# Patient Record
Sex: Male | Born: 1935
Health system: Southern US, Community
[De-identification: ages and names within clinical notes are randomized; demographics above are authoritative.]

## PROBLEM LIST (undated history)

## (undated) DIAGNOSIS — I2510 Atherosclerotic heart disease of native coronary artery without angina pectoris: Secondary | ICD-10-CM

## (undated) DIAGNOSIS — H18519 Endothelial corneal dystrophy, unspecified eye: Secondary | ICD-10-CM

## (undated) DIAGNOSIS — C61 Malignant neoplasm of prostate: Secondary | ICD-10-CM

## (undated) DIAGNOSIS — H3530 Unspecified macular degeneration: Secondary | ICD-10-CM

## (undated) DIAGNOSIS — I10 Essential (primary) hypertension: Secondary | ICD-10-CM

## (undated) DIAGNOSIS — K635 Polyp of colon: Secondary | ICD-10-CM

## (undated) DIAGNOSIS — E785 Hyperlipidemia, unspecified: Secondary | ICD-10-CM

## (undated) DIAGNOSIS — H1851 Endothelial corneal dystrophy: Secondary | ICD-10-CM

## (undated) DIAGNOSIS — E119 Type 2 diabetes mellitus without complications: Secondary | ICD-10-CM

## (undated) HISTORY — DX: Polyp of colon: K63.5

## (undated) HISTORY — PX: OTHER SURGICAL HISTORY: SHX169

## (undated) HISTORY — DX: Atherosclerotic heart disease of native coronary artery without angina pectoris: I25.10

## (undated) HISTORY — DX: Essential (primary) hypertension: I10

## (undated) HISTORY — DX: Type 2 diabetes mellitus without complications: E11.9

## (undated) HISTORY — DX: Hyperlipidemia, unspecified: E78.5

---

## 1994-06-07 DIAGNOSIS — Z860100 Personal history of colon polyps, unspecified: Secondary | ICD-10-CM | POA: Insufficient documentation

## 1994-06-07 DIAGNOSIS — K635 Polyp of colon: Secondary | ICD-10-CM

## 1994-06-07 HISTORY — DX: Personal history of colonic polyps: Z86.010

## 1994-06-07 HISTORY — DX: Polyp of colon: K63.5

## 1994-06-07 HISTORY — DX: Personal history of colon polyps, unspecified: Z86.0100

## 1995-06-17 ENCOUNTER — Encounter: Payer: Self-pay | Admitting: Gastroenterology

## 1999-08-11 ENCOUNTER — Inpatient Hospital Stay (HOSPITAL_COMMUNITY): Admission: EM | Admit: 1999-08-11 | Discharge: 1999-08-14 | Payer: Self-pay | Admitting: Emergency Medicine

## 1999-08-11 ENCOUNTER — Encounter: Payer: Self-pay | Admitting: Internal Medicine

## 1999-08-12 ENCOUNTER — Encounter: Payer: Self-pay | Admitting: Internal Medicine

## 2000-06-07 DIAGNOSIS — E119 Type 2 diabetes mellitus without complications: Secondary | ICD-10-CM

## 2000-06-07 HISTORY — DX: Type 2 diabetes mellitus without complications: E11.9

## 2001-08-16 ENCOUNTER — Encounter: Payer: Self-pay | Admitting: Emergency Medicine

## 2001-08-16 ENCOUNTER — Emergency Department (HOSPITAL_COMMUNITY): Admission: EM | Admit: 2001-08-16 | Discharge: 2001-08-17 | Payer: Self-pay | Admitting: Emergency Medicine

## 2004-06-24 ENCOUNTER — Ambulatory Visit: Payer: Self-pay | Admitting: Internal Medicine

## 2004-12-22 ENCOUNTER — Ambulatory Visit: Payer: Self-pay | Admitting: Internal Medicine

## 2004-12-28 ENCOUNTER — Ambulatory Visit: Payer: Self-pay | Admitting: Gastroenterology

## 2004-12-29 ENCOUNTER — Ambulatory Visit: Payer: Self-pay | Admitting: Internal Medicine

## 2005-01-11 ENCOUNTER — Ambulatory Visit: Payer: Self-pay | Admitting: Gastroenterology

## 2005-03-24 ENCOUNTER — Ambulatory Visit: Payer: Self-pay | Admitting: Internal Medicine

## 2005-04-05 ENCOUNTER — Ambulatory Visit: Payer: Self-pay | Admitting: Internal Medicine

## 2005-07-26 ENCOUNTER — Ambulatory Visit: Payer: Self-pay | Admitting: Internal Medicine

## 2005-08-02 ENCOUNTER — Ambulatory Visit: Payer: Self-pay | Admitting: Internal Medicine

## 2005-10-25 ENCOUNTER — Ambulatory Visit: Payer: Self-pay | Admitting: Internal Medicine

## 2005-11-02 ENCOUNTER — Ambulatory Visit: Payer: Self-pay | Admitting: Internal Medicine

## 2006-02-21 ENCOUNTER — Ambulatory Visit: Payer: Self-pay | Admitting: Internal Medicine

## 2006-02-28 ENCOUNTER — Ambulatory Visit: Payer: Self-pay | Admitting: Internal Medicine

## 2006-06-27 ENCOUNTER — Ambulatory Visit: Payer: Self-pay | Admitting: Internal Medicine

## 2006-06-27 LAB — CONVERTED CEMR LAB
ALT: 21 units/L (ref 0–40)
AST: 20 units/L (ref 0–37)
Albumin: 3.7 g/dL (ref 3.5–5.2)
Alkaline Phosphatase: 88 units/L (ref 39–117)
Bilirubin, Direct: 0.2 mg/dL (ref 0.0–0.3)
Cholesterol: 112 mg/dL (ref 0–200)
Creatinine,U: 138.5 mg/dL
HDL: 35.1 mg/dL — ABNORMAL LOW (ref >39.0)
Hgb A1c MFr Bld: 7 % — ABNORMAL HIGH (ref 4.6–6.0)
LDL Cholesterol: 63 mg/dL (ref 0–99)
Microalb Creat Ratio: 7.9 mg/g (ref 0.0–30.0)
Microalb, Ur: 1.1 mg/dL (ref 0.0–1.9)
PSA: 3.02 ng/mL (ref 0.10–4.00)
Total Bilirubin: 0.7 mg/dL (ref 0.3–1.2)
Total CHOL/HDL Ratio: 3.2
Total Protein: 6.5 g/dL (ref 6.0–8.3)
Triglycerides: 69 mg/dL (ref 0–149)
VLDL: 14 mg/dL (ref 0–40)

## 2006-07-04 ENCOUNTER — Ambulatory Visit: Payer: Self-pay | Admitting: Internal Medicine

## 2006-07-04 ENCOUNTER — Encounter: Payer: Self-pay | Admitting: Internal Medicine

## 2006-07-04 DIAGNOSIS — I10 Essential (primary) hypertension: Secondary | ICD-10-CM | POA: Insufficient documentation

## 2006-07-04 DIAGNOSIS — E1122 Type 2 diabetes mellitus with diabetic chronic kidney disease: Secondary | ICD-10-CM | POA: Insufficient documentation

## 2006-07-04 DIAGNOSIS — E119 Type 2 diabetes mellitus without complications: Secondary | ICD-10-CM | POA: Insufficient documentation

## 2006-07-04 DIAGNOSIS — E785 Hyperlipidemia, unspecified: Secondary | ICD-10-CM

## 2006-07-04 DIAGNOSIS — E1136 Type 2 diabetes mellitus with diabetic cataract: Secondary | ICD-10-CM | POA: Insufficient documentation

## 2006-07-04 HISTORY — DX: Essential (primary) hypertension: I10

## 2006-07-04 HISTORY — DX: Type 2 diabetes mellitus without complications: E11.9

## 2006-10-24 ENCOUNTER — Ambulatory Visit: Payer: Self-pay | Admitting: Internal Medicine

## 2006-10-24 LAB — CONVERTED CEMR LAB
ALT: 23 units/L (ref 0–40)
AST: 20 units/L (ref 0–37)
Albumin: 3.8 g/dL (ref 3.5–5.2)
Alkaline Phosphatase: 80 units/L (ref 39–117)
BUN: 32 mg/dL — ABNORMAL HIGH (ref 6–23)
Basophils Absolute: 0 10*3/uL (ref 0.0–0.1)
Basophils Relative: 0.5 % (ref 0.0–1.0)
Bilirubin, Direct: 0.1 mg/dL (ref 0.0–0.3)
CO2: 33 meq/L — ABNORMAL HIGH (ref 19–32)
Calcium: 9.2 mg/dL (ref 8.4–10.5)
Chloride: 110 meq/L (ref 96–112)
Cholesterol: 134 mg/dL (ref 0–200)
Creatinine, Ser: 1.1 mg/dL (ref 0.4–1.5)
Eosinophils Absolute: 0.1 10*3/uL (ref 0.0–0.6)
Eosinophils Relative: 1.2 % (ref 0.0–5.0)
GFR calc Af Amer: 85 mL/min
GFR calc non Af Amer: 70 mL/min
Glucose, Bld: 123 mg/dL — ABNORMAL HIGH (ref 70–99)
HCT: 38.5 % — ABNORMAL LOW (ref 39.0–52.0)
HDL: 41.3 mg/dL (ref >39.0)
Hemoglobin: 13 g/dL (ref 13.0–17.0)
Hgb A1c MFr Bld: 7.3 % — ABNORMAL HIGH (ref 4.6–6.0)
LDL Cholesterol: 77 mg/dL (ref 0–99)
Lymphocytes Relative: 26.3 % (ref 12.0–46.0)
MCHC: 33.8 g/dL (ref 30.0–36.0)
MCV: 89.8 fL (ref 78.0–100.0)
Monocytes Absolute: 0.9 10*3/uL — ABNORMAL HIGH (ref 0.2–0.7)
Monocytes Relative: 9.4 % (ref 3.0–11.0)
Neutro Abs: 6.1 10*3/uL (ref 1.4–7.7)
Neutrophils Relative %: 62.6 % (ref 43.0–77.0)
Platelets: 254 10*3/uL (ref 150–400)
Potassium: 4.4 meq/L (ref 3.5–5.1)
RBC: 4.29 M/uL (ref 4.22–5.81)
RDW: 12.5 % (ref 11.5–14.6)
Sodium: 148 meq/L — ABNORMAL HIGH (ref 135–145)
Total Bilirubin: 0.5 mg/dL (ref 0.3–1.2)
Total CHOL/HDL Ratio: 3.2
Total Protein: 6.7 g/dL (ref 6.0–8.3)
Triglycerides: 79 mg/dL (ref 0–149)
VLDL: 16 mg/dL (ref 0–40)
WBC: 9.8 10*3/uL (ref 4.5–10.5)

## 2006-11-04 ENCOUNTER — Ambulatory Visit: Payer: Self-pay | Admitting: Internal Medicine

## 2007-02-27 ENCOUNTER — Ambulatory Visit: Payer: Self-pay | Admitting: Internal Medicine

## 2007-02-27 LAB — CONVERTED CEMR LAB
ALT: 25 units/L (ref 0–53)
AST: 24 units/L (ref 0–37)
Albumin: 3.8 g/dL (ref 3.5–5.2)
Alkaline Phosphatase: 84 units/L (ref 39–117)
BUN: 32 mg/dL — ABNORMAL HIGH (ref 6–23)
Bilirubin, Direct: 0.2 mg/dL (ref 0.0–0.3)
CO2: 30 meq/L (ref 19–32)
Calcium: 9.3 mg/dL (ref 8.4–10.5)
Chloride: 106 meq/L (ref 96–112)
Cholesterol: 118 mg/dL (ref 0–200)
Creatinine, Ser: 1.1 mg/dL (ref 0.4–1.5)
GFR calc Af Amer: 84 mL/min
GFR calc non Af Amer: 70 mL/min
Glucose, Bld: 115 mg/dL — ABNORMAL HIGH (ref 70–99)
HDL: 35.9 mg/dL — ABNORMAL LOW (ref >39.0)
Hgb A1c MFr Bld: 7.2 % — ABNORMAL HIGH (ref 4.6–6.0)
LDL Cholesterol: 68 mg/dL (ref 0–99)
Potassium: 5 meq/L (ref 3.5–5.1)
Sodium: 144 meq/L (ref 135–145)
Total Bilirubin: 0.8 mg/dL (ref 0.3–1.2)
Total CHOL/HDL Ratio: 3.3
Total Protein: 6.5 g/dL (ref 6.0–8.3)
Triglycerides: 70 mg/dL (ref 0–149)
VLDL: 14 mg/dL (ref 0–40)

## 2007-03-06 ENCOUNTER — Ambulatory Visit: Payer: Self-pay | Admitting: Internal Medicine

## 2007-03-06 LAB — CONVERTED CEMR LAB
Cholesterol, target level: 200 mg/dL
HDL goal, serum: 40 mg/dL
LDL Goal: 100 mg/dL

## 2007-06-23 ENCOUNTER — Ambulatory Visit: Payer: Self-pay | Admitting: Internal Medicine

## 2007-06-23 LAB — CONVERTED CEMR LAB
ALT: 22 units/L (ref 0–53)
AST: 23 units/L (ref 0–37)
Bilirubin, Direct: 0.1 mg/dL (ref 0.0–0.3)
CO2: 32 meq/L (ref 19–32)
Calcium: 9.9 mg/dL (ref 8.4–10.5)
Chloride: 106 meq/L (ref 96–112)
Creatinine,U: 147.4 mg/dL
Glucose, Bld: 111 mg/dL — ABNORMAL HIGH (ref 70–99)
HDL: 36.4 mg/dL — ABNORMAL LOW (ref >39.0)
Sodium: 145 meq/L (ref 135–145)
Total CHOL/HDL Ratio: 3.4
Total Protein: 6.9 g/dL (ref 6.0–8.3)

## 2007-06-30 ENCOUNTER — Ambulatory Visit: Payer: Self-pay | Admitting: Internal Medicine

## 2007-10-25 ENCOUNTER — Ambulatory Visit: Payer: Self-pay | Admitting: Internal Medicine

## 2007-10-25 LAB — CONVERTED CEMR LAB
Bilirubin, Direct: 0.1 mg/dL (ref 0.0–0.3)
Calcium: 9.3 mg/dL (ref 8.4–10.5)
GFR calc Af Amer: 84 mL/min
GFR calc non Af Amer: 70 mL/min
Glucose, Bld: 117 mg/dL — ABNORMAL HIGH (ref 70–99)
HDL: 33.6 mg/dL — ABNORMAL LOW (ref >39.0)
LDL Cholesterol: 73 mg/dL (ref 0–99)
PSA: 2.39 ng/mL (ref 0.10–4.00)
Potassium: 4.9 meq/L (ref 3.5–5.1)
Sodium: 144 meq/L (ref 135–145)
Total Bilirubin: 0.8 mg/dL (ref 0.3–1.2)
Total CHOL/HDL Ratio: 3.9
Total Protein: 6.9 g/dL (ref 6.0–8.3)
VLDL: 25 mg/dL (ref 0–40)

## 2007-11-01 ENCOUNTER — Ambulatory Visit: Payer: Self-pay | Admitting: Internal Medicine

## 2008-02-20 ENCOUNTER — Ambulatory Visit: Payer: Self-pay | Admitting: Internal Medicine

## 2008-02-20 LAB — CONVERTED CEMR LAB
AST: 26 units/L (ref 0–37)
Albumin: 3.8 g/dL (ref 3.5–5.2)
Alkaline Phosphatase: 87 units/L (ref 39–117)
BUN: 28 mg/dL — ABNORMAL HIGH (ref 6–23)
Cholesterol: 146 mg/dL (ref 0–200)
Creatinine, Ser: 1 mg/dL (ref 0.4–1.5)
GFR calc Af Amer: 94 mL/min
Glucose, Bld: 123 mg/dL — ABNORMAL HIGH (ref 70–99)
Potassium: 4.7 meq/L (ref 3.5–5.1)
Total Protein: 6.9 g/dL (ref 6.0–8.3)
Triglycerides: 80 mg/dL (ref 0–149)
VLDL: 16 mg/dL (ref 0–40)

## 2008-02-27 ENCOUNTER — Ambulatory Visit: Payer: Self-pay | Admitting: Internal Medicine

## 2008-03-13 ENCOUNTER — Encounter: Payer: Self-pay | Admitting: Internal Medicine

## 2008-06-07 HISTORY — PX: OTHER SURGICAL HISTORY: SHX169

## 2008-06-18 ENCOUNTER — Ambulatory Visit: Payer: Self-pay | Admitting: Internal Medicine

## 2008-06-18 LAB — CONVERTED CEMR LAB
ALT: 21 units/L (ref 0–53)
AST: 20 units/L (ref 0–37)
Bilirubin, Direct: 0.1 mg/dL (ref 0.0–0.3)
CO2: 30 meq/L (ref 19–32)
Chloride: 103 meq/L (ref 96–112)
Sodium: 142 meq/L (ref 135–145)
Total Bilirubin: 0.9 mg/dL (ref 0.3–1.2)
Total CHOL/HDL Ratio: 3.1
Triglycerides: 65 mg/dL (ref 0–149)

## 2008-06-25 ENCOUNTER — Ambulatory Visit: Payer: Self-pay | Admitting: Internal Medicine

## 2008-10-15 ENCOUNTER — Ambulatory Visit: Payer: Self-pay | Admitting: Internal Medicine

## 2008-10-15 LAB — CONVERTED CEMR LAB
AST: 25 units/L (ref 0–37)
Alkaline Phosphatase: 62 units/L (ref 39–117)
BUN: 32 mg/dL — ABNORMAL HIGH (ref 6–23)
Bilirubin, Direct: 0 mg/dL (ref 0.0–0.3)
CO2: 33 meq/L — ABNORMAL HIGH (ref 19–32)
Calcium: 9.4 mg/dL (ref 8.4–10.5)
Chloride: 103 meq/L (ref 96–112)
Creatinine, Ser: 1.1 mg/dL (ref 0.4–1.5)
Glucose, Bld: 97 mg/dL (ref 70–99)
LDL Cholesterol: 65 mg/dL (ref 0–99)
Total Bilirubin: 0.7 mg/dL (ref 0.3–1.2)
Total CHOL/HDL Ratio: 3
Triglycerides: 81 mg/dL (ref 0.0–149.0)

## 2008-10-22 ENCOUNTER — Ambulatory Visit: Payer: Self-pay | Admitting: Internal Medicine

## 2009-02-21 ENCOUNTER — Ambulatory Visit: Payer: Self-pay | Admitting: Internal Medicine

## 2009-02-21 LAB — CONVERTED CEMR LAB
Alkaline Phosphatase: 76 units/L (ref 39–117)
BUN: 26 mg/dL — ABNORMAL HIGH (ref 6–23)
Bilirubin, Direct: 0 mg/dL (ref 0.0–0.3)
CO2: 33 meq/L — ABNORMAL HIGH (ref 19–32)
Chloride: 108 meq/L (ref 96–112)
Cholesterol: 130 mg/dL (ref 0–200)
Creatinine, Ser: 1.1 mg/dL (ref 0.4–1.5)
Hgb A1c MFr Bld: 6.9 % — ABNORMAL HIGH (ref 4.6–6.5)
LDL Cholesterol: 74 mg/dL (ref 0–99)
Potassium: 4.5 meq/L (ref 3.5–5.1)
Total CHOL/HDL Ratio: 3
Total Protein: 7.1 g/dL (ref 6.0–8.3)
VLDL: 10.4 mg/dL (ref 0.0–40.0)

## 2009-02-28 ENCOUNTER — Ambulatory Visit: Payer: Self-pay | Admitting: Internal Medicine

## 2009-03-06 ENCOUNTER — Encounter: Payer: Self-pay | Admitting: Internal Medicine

## 2009-03-19 ENCOUNTER — Encounter: Payer: Self-pay | Admitting: Internal Medicine

## 2009-03-24 ENCOUNTER — Encounter: Payer: Self-pay | Admitting: Internal Medicine

## 2009-06-18 ENCOUNTER — Ambulatory Visit: Payer: Self-pay | Admitting: Internal Medicine

## 2009-06-18 LAB — CONVERTED CEMR LAB
ALT: 21 units/L (ref 0–53)
Chloride: 107 meq/L (ref 96–112)
Cholesterol: 157 mg/dL (ref 0–200)
GFR calc non Af Amer: 69.29 mL/min (ref >60)
HDL: 47.7 mg/dL (ref >39.00)
Hgb A1c MFr Bld: 7.2 % — ABNORMAL HIGH (ref 4.6–6.5)
LDL Cholesterol: 93 mg/dL (ref 0–99)
Potassium: 4.4 meq/L (ref 3.5–5.1)
Sodium: 143 meq/L (ref 135–145)
Total Bilirubin: 0.7 mg/dL (ref 0.3–1.2)
Total Protein: 6.6 g/dL (ref 6.0–8.3)
VLDL: 16.6 mg/dL (ref 0.0–40.0)

## 2009-07-07 ENCOUNTER — Ambulatory Visit: Payer: Self-pay | Admitting: Internal Medicine

## 2009-10-20 ENCOUNTER — Ambulatory Visit: Payer: Self-pay | Admitting: Internal Medicine

## 2009-10-20 LAB — CONVERTED CEMR LAB
Albumin: 4 g/dL (ref 3.5–5.2)
BUN: 40 mg/dL — ABNORMAL HIGH (ref 6–23)
Basophils Relative: 0.8 % (ref 0.0–3.0)
Bilirubin Urine: NEGATIVE
Bilirubin, Direct: 0.1 mg/dL (ref 0.0–0.3)
Blood in Urine, dipstick: NEGATIVE
CO2: 31 meq/L (ref 19–32)
Chloride: 104 meq/L (ref 96–112)
Cholesterol: 134 mg/dL (ref 0–200)
Creatinine, Ser: 1.3 mg/dL (ref 0.4–1.5)
Eosinophils Absolute: 0.6 10*3/uL (ref 0.0–0.7)
Glucose, Bld: 118 mg/dL — ABNORMAL HIGH (ref 70–99)
Glucose, Urine, Semiquant: NEGATIVE
Hgb A1c MFr Bld: 7.1 % — ABNORMAL HIGH (ref 4.6–6.5)
Ketones, urine, test strip: NEGATIVE
MCHC: 34.7 g/dL (ref 30.0–36.0)
MCV: 90 fL (ref 78.0–100.0)
Microalb Creat Ratio: 1.4 mg/g (ref 0.0–30.0)
Monocytes Absolute: 1 10*3/uL (ref 0.1–1.0)
Neutrophils Relative %: 55.9 % (ref 43.0–77.0)
PSA: 3.53 ng/mL (ref 0.10–4.00)
RBC: 3.74 M/uL — ABNORMAL LOW (ref 4.22–5.81)
RDW: 13.6 % (ref 11.5–14.6)
Specific Gravity, Urine: 1.025
Total CHOL/HDL Ratio: 3
Total Protein: 6.8 g/dL (ref 6.0–8.3)
Triglycerides: 103 mg/dL (ref 0.0–149.0)
pH: 5

## 2009-10-27 ENCOUNTER — Ambulatory Visit: Payer: Self-pay | Admitting: Internal Medicine

## 2009-10-27 DIAGNOSIS — D649 Anemia, unspecified: Secondary | ICD-10-CM

## 2009-10-27 HISTORY — DX: Anemia, unspecified: D64.9

## 2009-10-27 LAB — CONVERTED CEMR LAB
IgA: 197 mg/dL (ref 68–378)
IgG (Immunoglobin G), Serum: 1060 mg/dL (ref 694–1618)

## 2009-10-29 LAB — CONVERTED CEMR LAB
Basophils Relative: 0.7 % (ref 0.0–3.0)
Eosinophils Relative: 6.1 % — ABNORMAL HIGH (ref 0.0–5.0)
Lymphocytes Relative: 25.5 % (ref 12.0–46.0)
Monocytes Relative: 9.8 % (ref 3.0–12.0)
Neutrophils Relative %: 57.9 % (ref 43.0–77.0)
RBC: 3.59 M/uL — ABNORMAL LOW (ref 4.22–5.81)
Saturation Ratios: 21.3 % (ref 20.0–50.0)
Transferrin: 218.3 mg/dL (ref 212.0–360.0)
WBC: 11.1 10*3/uL — ABNORMAL HIGH (ref 4.5–10.5)

## 2010-02-18 ENCOUNTER — Ambulatory Visit: Payer: Self-pay | Admitting: Internal Medicine

## 2010-02-18 LAB — CONVERTED CEMR LAB
AST: 21 units/L (ref 0–37)
Albumin: 3.9 g/dL (ref 3.5–5.2)
Alkaline Phosphatase: 66 units/L (ref 39–117)
CO2: 28 meq/L (ref 19–32)
Calcium: 9.6 mg/dL (ref 8.4–10.5)
Cholesterol: 125 mg/dL (ref 0–200)
Creatinine, Ser: 1.2 mg/dL (ref 0.4–1.5)
Total CHOL/HDL Ratio: 3
Total Protein: 6.5 g/dL (ref 6.0–8.3)
Triglycerides: 108 mg/dL (ref 0.0–149.0)

## 2010-03-04 ENCOUNTER — Ambulatory Visit: Payer: Self-pay | Admitting: Internal Medicine

## 2010-03-09 LAB — CONVERTED CEMR LAB
Basophils Absolute: 0.1 10*3/uL (ref 0.0–0.1)
Eosinophils Absolute: 0.2 10*3/uL (ref 0.0–0.7)
Eosinophils Relative: 2.1 % (ref 0.0–5.0)
Folate: >20 ng/mL
MCV: 91.1 fL (ref 78.0–100.0)
Monocytes Absolute: 1.1 10*3/uL — ABNORMAL HIGH (ref 0.1–1.0)
Neutrophils Relative %: 59.5 % (ref 43.0–77.0)
Platelets: 224 10*3/uL (ref 150.0–400.0)
RDW: 12.9 % (ref 11.5–14.6)
Saturation Ratios: 19.5 % — ABNORMAL LOW (ref 20.0–50.0)
Vitamin B-12: 390 pg/mL (ref 211–911)
WBC: 10.6 10*3/uL — ABNORMAL HIGH (ref 4.5–10.5)

## 2010-03-10 ENCOUNTER — Encounter: Payer: Self-pay | Admitting: Gastroenterology

## 2010-03-10 ENCOUNTER — Ambulatory Visit: Payer: Self-pay | Admitting: Internal Medicine

## 2010-03-17 ENCOUNTER — Ambulatory Visit: Payer: Self-pay | Admitting: Internal Medicine

## 2010-03-17 LAB — CONVERTED CEMR LAB: OCCULT 3: NEGATIVE

## 2010-04-06 ENCOUNTER — Encounter: Payer: Self-pay | Admitting: Internal Medicine

## 2010-04-20 DIAGNOSIS — Z86010 Personal history of colonic polyps: Secondary | ICD-10-CM | POA: Insufficient documentation

## 2010-04-20 DIAGNOSIS — K649 Unspecified hemorrhoids: Secondary | ICD-10-CM | POA: Insufficient documentation

## 2010-04-21 ENCOUNTER — Encounter (INDEPENDENT_AMBULATORY_CARE_PROVIDER_SITE_OTHER): Payer: Self-pay | Admitting: *Deleted

## 2010-04-21 ENCOUNTER — Ambulatory Visit: Payer: Self-pay | Admitting: Gastroenterology

## 2010-05-25 ENCOUNTER — Ambulatory Visit: Payer: Self-pay | Admitting: Gastroenterology

## 2010-05-25 DIAGNOSIS — K2990 Gastroduodenitis, unspecified, without bleeding: Secondary | ICD-10-CM

## 2010-05-25 DIAGNOSIS — K2970 Gastritis, unspecified, without bleeding: Secondary | ICD-10-CM

## 2010-05-25 HISTORY — DX: Gastritis, unspecified, without bleeding: K29.70

## 2010-05-26 LAB — HM COLONOSCOPY: HM Colonoscopy: NORMAL

## 2010-05-27 ENCOUNTER — Encounter: Payer: Self-pay | Admitting: Gastroenterology

## 2010-06-16 ENCOUNTER — Ambulatory Visit
Admission: RE | Admit: 2010-06-16 | Discharge: 2010-06-16 | Payer: Self-pay | Source: Home / Self Care | Attending: Gastroenterology | Admitting: Gastroenterology

## 2010-06-16 DIAGNOSIS — R1013 Epigastric pain: Secondary | ICD-10-CM | POA: Insufficient documentation

## 2010-06-16 DIAGNOSIS — R110 Nausea: Secondary | ICD-10-CM | POA: Insufficient documentation

## 2010-06-19 ENCOUNTER — Ambulatory Visit (HOSPITAL_COMMUNITY)
Admission: RE | Admit: 2010-06-19 | Discharge: 2010-06-19 | Payer: Self-pay | Source: Home / Self Care | Attending: Gastroenterology | Admitting: Gastroenterology

## 2010-06-30 ENCOUNTER — Encounter: Payer: Self-pay | Admitting: Internal Medicine

## 2010-06-30 ENCOUNTER — Ambulatory Visit
Admission: RE | Admit: 2010-06-30 | Discharge: 2010-06-30 | Payer: Self-pay | Source: Home / Self Care | Attending: Internal Medicine | Admitting: Internal Medicine

## 2010-06-30 ENCOUNTER — Other Ambulatory Visit: Payer: Self-pay | Admitting: Internal Medicine

## 2010-06-30 DIAGNOSIS — D649 Anemia, unspecified: Secondary | ICD-10-CM

## 2010-06-30 DIAGNOSIS — E119 Type 2 diabetes mellitus without complications: Secondary | ICD-10-CM

## 2010-06-30 DIAGNOSIS — K219 Gastro-esophageal reflux disease without esophagitis: Secondary | ICD-10-CM

## 2010-06-30 DIAGNOSIS — E785 Hyperlipidemia, unspecified: Secondary | ICD-10-CM

## 2010-06-30 DIAGNOSIS — I10 Essential (primary) hypertension: Secondary | ICD-10-CM

## 2010-06-30 LAB — CBC WITH DIFFERENTIAL/PLATELET
Basophils Relative: 0.6 % (ref 0.0–3.0)
Eosinophils Absolute: 0.2 10*3/uL (ref 0.0–0.7)
HCT: 32.7 % — ABNORMAL LOW (ref 39.0–52.0)
Hemoglobin: 11.2 g/dL — ABNORMAL LOW (ref 13.0–17.0)
Lymphs Abs: 2.4 10*3/uL (ref 0.7–4.0)
MCHC: 34.3 g/dL (ref 30.0–36.0)
MCV: 90.6 fl (ref 78.0–100.0)
Monocytes Absolute: 0.7 10*3/uL (ref 0.1–1.0)
Neutro Abs: 5.4 10*3/uL (ref 1.4–7.7)
RBC: 3.61 Mil/uL — ABNORMAL LOW (ref 4.22–5.81)

## 2010-06-30 LAB — LIPID PANEL
Cholesterol: 111 mg/dL (ref 0–200)
LDL Cholesterol: 60 mg/dL (ref 0–99)

## 2010-06-30 LAB — BASIC METABOLIC PANEL
BUN: 28 mg/dL — ABNORMAL HIGH (ref 6–23)
Calcium: 9.4 mg/dL (ref 8.4–10.5)
Chloride: 105 mEq/L (ref 96–112)
Creatinine, Ser: 1.2 mg/dL (ref 0.4–1.5)

## 2010-06-30 LAB — HEPATIC FUNCTION PANEL
ALT: 16 U/L (ref 0–53)
AST: 19 U/L (ref 0–37)
Albumin: 3.6 g/dL (ref 3.5–5.2)
Total Bilirubin: 0.6 mg/dL (ref 0.3–1.2)
Total Protein: 6.1 g/dL (ref 6.0–8.3)

## 2010-06-30 LAB — HEMOGLOBIN A1C: Hgb A1c MFr Bld: 7.2 % — ABNORMAL HIGH (ref 4.6–6.5)

## 2010-06-30 NOTE — Assessment & Plan Note (Signed)
Well controlled. Continue current medications. Check Bmet med today.

## 2010-06-30 NOTE — Assessment & Plan Note (Signed)
Check laboratory work today. We'll continue simvastatin.

## 2010-06-30 NOTE — Assessment & Plan Note (Signed)
I suspect well controlled. Check laboratory work today. Reviewed recent laboratory work. Reviewed medications. Continue same medications.

## 2010-06-30 NOTE — Assessment & Plan Note (Signed)
Check labs today.

## 2010-06-30 NOTE — Assessment & Plan Note (Signed)
Patient has followup with Dr. Jarold Motto. With this evaluation. Repeat ultrasound results with the patient.

## 2010-06-30 NOTE — Progress Notes (Signed)
Subjective:     Patient ID: [name] is a 77 y.o. male.  HPI Dm--home cbgs-none--no polyuria or polydipsia htn--no home BPs Lipids---tolerating meds GERD---followed by dr patterson--pt asks about [place] results The following portions of the patient's history were reviewed and updated as appropriate: allergies, current medications, past family history, past medical history, past social history, past surgical history and problem list.  Review of Systems  Constitutional: Negative for diaphoresis, activity change and appetite change.  HENT: Negative for congestion, rhinorrhea and neck pain.   Eyes: Negative for photophobia and discharge.  Respiratory: Negative for choking.   Cardiovascular: Negative for chest pain.  Genitourinary: Negative for dysuria and flank pain.  Musculoskeletal: Negative for back pain and arthralgias.  Neurological: Negative for dizziness.  Hematological: Negative for adenopathy.  Psychiatric/Behavioral: Negative for confusion and agitation.       Objective:   Physical Exam  Constitutional: He appears well-developed and well-nourished.  HENT:  Head: Normocephalic and atraumatic.  Right Ear: External ear normal.  Left Ear: External ear normal.  Nose: Nose normal.  Eyes: Right eye exhibits no discharge. No scleral icterus.  Neck: Neck supple. No thyromegaly present.  Cardiovascular: Normal rate, regular rhythm and normal heart sounds.   No murmur heard. Pulmonary/Chest: Effort normal and breath sounds normal. No respiratory distress.  Abdominal: Soft. Bowel sounds are normal. He exhibits no distension. There is no tenderness.  Musculoskeletal: He exhibits no edema.  Lymphadenopathy:    He has no cervical adenopathy.  Neurological: He is alert. No cranial nerve deficit.  Skin: Skin is warm and dry.  Psychiatric: He has a normal mood and affect.       Assessment:

## 2010-06-30 NOTE — Progress Notes (Deleted)
Subjective:     Patient ID: [name] is a 77 y.o. male.  HPI  {Common ambulatory SmartLinks:19316}  Review of Systems  Constitutional: Negative for diaphoresis, activity change and appetite change.  HENT: Negative for congestion, rhinorrhea and neck pain.   Eyes: Negative for photophobia and discharge.  Respiratory: Negative for choking.   Cardiovascular: Negative for chest pain.  Genitourinary: Negative for dysuria and flank pain.  Musculoskeletal: Negative for back pain and arthralgias.  Neurological: Negative for dizziness.  Hematological: Negative for adenopathy.  Psychiatric/Behavioral: Negative for confusion and agitation.       Objective:   Physical Exam     Assessment:     ***    Plan:     ***

## 2010-07-08 ENCOUNTER — Other Ambulatory Visit (INDEPENDENT_AMBULATORY_CARE_PROVIDER_SITE_OTHER): Payer: Medicare PPO | Admitting: Internal Medicine

## 2010-07-08 DIAGNOSIS — R710 Precipitous drop in hematocrit: Secondary | ICD-10-CM

## 2010-07-09 NOTE — Assessment & Plan Note (Signed)
Summary: 4 MONTH ROV/NJR   Vital Signs:  Patient profile:   77 year old male Weight:      126 pounds Temp:     98.7 degrees F oral Pulse rate:   68 / minute Pulse rhythm:   regular BP sitting:   130 / 58  (left arm) Cuff size:   regular  Vitals Entered By: Alfred Levins, CMA (June 30, 2010 8:08 AM) CC: f/u   CC:  f/u.  Current Medications (verified): 1)  Losartan Potassium-Hctz 100-25 Mg Tabs (Losartan Potassium-Hctz) .... Take 1 Tablet By Mouth Once A Day 2)  Felodipine 5 Mg Tb24 (Felodipine) .... One By Mouth Daily 3)  Metformin Hcl 1000 Mg Tabs (Metformin Hcl) .... Take 1 Tablet By Mouth Two Times A Day 4)  Simvastatin 40 Mg Tabs (Simvastatin) .... One By Mouth Daily 5)  Ecotrin 325 Mg Tbec (Aspirin) .[name].. 1 By Mouth Once Daily 6)  Fish Oil 1200 Mg Caps (Omega-3 Fatty Acids) .... Two Times A Day 7)  Centrum Silver  Tabs (Multiple Vitamins-Minerals) .... Once Daily 8)  Icaps  Caps (Multiple Vitamins-Minerals) .... Once Daily 9)  Optive 0.5-0.9 % Soln (Carboxymethylcellul-Glycerin) .... One Drop Both Eyes Once Daily 10)  Align  Caps (Probiotic Product) .... Take One By Mouth Once Daily 11)  Carafate 1 Gm/79ml Susp (Sucralfate) .... Take 2 Teaspoons After Meals and At Bedtime  Allergies (verified): No Known Drug Allergies   Complete Medication List: 1)  Losartan Potassium-hctz 100-25 Mg Tabs (Losartan potassium-hctz) .... Take 1 tablet by mouth once a day 2)  Felodipine 5 Mg Tb24 (Felodipine) .... One by mouth daily 3)  Metformin Hcl 1000 Mg Tabs (Metformin hcl) .... Take 1 tablet by mouth two times a day 4)  Simvastatin 40 Mg Tabs (Simvastatin) .... One by mouth daily 5)  Ecotrin 325 Mg Tbec (Aspirin) .[name].. 1 by mouth once daily 6)  Fish Oil 1200 Mg Caps (Omega-3 fatty acids) .... Two times a day 7)  Centrum Silver Tabs (Multiple vitamins-minerals) .... Once daily 8)  Icaps Caps (Multiple vitamins-minerals) .... Once daily 9)  Optive 0.5-0.9 % Soln  (Carboxymethylcellul-glycerin) .... One drop both eyes once daily 10)  Align Caps (Probiotic product) .... Take one by mouth once daily 11)  Carafate 1 Gm/63ml Susp (Sucralfate) .... Take 2 teaspoons after meals and at bedtime  Other Orders: TLB-CBC Platelet - w/Differential (85025-CBCD) Venipuncture (21308) TLB-BMP (Basic Metabolic Panel-BMET) (80048-METABOL) TLB-Lipid Panel (80061-LIPID) TLB-Hepatic/Liver Function Pnl (80076-HEPATIC) TLB-A1C / Hgb A1C (Glycohemoglobin) (83036-A1C)  Patient Instructions: 1)  Please schedule a follow-up appointment in 4 months. 2)  labs one week prior to visit 3)  lipids---272.4 4)  lfts-995.2 5)  bmet-995.2 6)  A1C-250.02 7)      Progress Notes    Judie Petit, MD 06/30/2010 8:39 AM Signed   Subjective:  Patient ID: [name] is a 77 y.o. male.  HPI  Dm--home cbgs-none--no polyuria or polydipsia  htn--no home BPs  Lipids---tolerating meds  GERD---followed by dr patterson--pt asks about [place] results  The following portions of the patient's history were reviewed and updated as appropriate: allergies, current medications, past family history, past medical history, past social history, past surgical history and problem list.  Review of Systems Constitutional: Negative for diaphoresis, activity change and appetite change. HENT: Negative for congestion, rhinorrhea and neck pain. Eyes: Negative for photophobia and discharge. Respiratory: Negative for choking. Cardiovascular: Negative for chest pain. Genitourinary: Negative for dysuria and flank pain. Musculoskeletal: Negative for back  pain and arthralgias. Neurological: Negative for dizziness. Hematological: Negative for adenopathy. Psychiatric/Behavioral: Negative for confusion and agitation.    Objective:  Physical Exam Constitutional: He appears well-developed and well-nourished. HENT: Head: Normocephalic and atraumatic. Right Ear: External ear normal. Left Ear: External ear  normal. Nose: Nose normal. Eyes: Right eye exhibits no discharge. No scleral icterus. Neck: Neck supple. No thyromegaly present. Cardiovascular: Normal rate, regular rhythm and normal heart sounds. No murmur heard.  Pulmonary/Chest: Effort normal and breath sounds normal. No respiratory distress. Abdominal: Soft. Bowel sounds are normal. He exhibits no distension. There is no tenderness. Musculoskeletal: He exhibits no edema. Lymphadenopathy: He has no cervical adenopathy. Neurological: He is alert. No cranial nerve deficit. Skin: Skin is warm and dry. Psychiatric: He has a normal mood and affect.    Assessment:         GERD - Judie Petit, MD 06/30/2010 8:37 AM Signed  Patient has followup with Dr. Jarold Motto. With this evaluation. Repeat ultrasound results with the patient.HYPERTENSION - Judie Petit, MD 06/30/2010 8:37 AM Signed  Well controlled. Continue current medications. Check Bmet med today.ANEMIA - Judie Petit, MD 06/30/2010 8:36 AM Signed  Check labs today.HYPERLIPIDEMIA - Judie Petit, MD 06/30/2010 8:36 AM Signed  Check laboratory work today. We'll continue simvastatin.DIABETES MELLITUS, TYPE II - Judie Petit, MD 06/30/2010 8:35 AM Signed  I suspect well controlled. Check laboratory work today. Reviewed recent laboratory work. Reviewed medications. Continue same medications.      Orders Added: 1)  Est. Patient Level IV [16109] 2)  TLB-CBC Platelet - w/Differential [85025-CBCD] 3)  Venipuncture [36415] 4)  TLB-BMP (Basic Metabolic Panel-BMET) [80048-METABOL] 5)  TLB-Lipid Panel [80061-LIPID] 6)  TLB-Hepatic/Liver Function Pnl [80076-HEPATIC] 7)  TLB-A1C / Hgb A1C (Glycohemoglobin) [83036-A1C]  Appended Document: Orders Update     Clinical Lists Changes  Orders: Added new Service order of Specimen Handling (60454) - Signed

## 2010-07-09 NOTE — Letter (Signed)
Summary: New Patient letter  [facility]  [address] [place], [place] [address]   Phone: [phone]  Fax: [phone]       03/10/2010 MRN: [id number]  [name] [address] [place], [place]  [address]  Dear [name],  Welcome to the Gastroenterology Division at [facility].    You are scheduled to see Dr.  Jarold Motto on 04-21-10 at 9am on the 3rd floor at [facility], [address]  We ask that you try to arrive at our office 15 minutes prior to your appointment time to allow for check-in.  We would like you to complete the enclosed self-administered evaluation form prior to your visit and bring it with you on the day of your appointment.  We will review it with you.  Also, please bring a complete list of all your medications or, if you prefer, bring the medication bottles and we will list them.  Please bring your insurance card so that we may make a copy of it.  If your insurance requires a referral to see a specialist, please bring your referral form from your primary care physician.  Co-payments are due at the time of your visit and may be paid by cash, check or credit card.     Your office visit will consist of a consult with your physician (includes a physical exam), any laboratory testing he/she may order, scheduling of any necessary diagnostic testing (e.g. x-ray, ultrasound, CT-scan), and scheduling of a procedure (e.g. Endoscopy, Colonoscopy) if required.  Please allow enough time on your schedule to allow for any/all of these possibilities.    If you cannot keep your appointment, please call [phone] to cancel or reschedule prior to your appointment date.  This allows [place] the opportunity to schedule an appointment for another patient in need of care.  If you do not cancel or reschedule by 5 p.m. the business day prior to your appointment date, you will be charged a $50.00 late cancellation/no-show fee.    Thank you for choosing [facility] for your medical needs.  We appreciate the opportunity to care for you.  Please visit [place] at our website  to learn more about our practice.                     Sincerely,                                                             The Gastroenterology Division

## 2010-07-09 NOTE — Assessment & Plan Note (Signed)
Summary: 4 MONTH ROV/NJR----PT RSC (BMP) // RS   Vital Signs:  Patient profile:   77 year old male Height:      64 inches (162.56 cm) Weight:      134.50 pounds (61.14 kg) BMI:     23.17 Temp:     98.4 degrees F (36.89 degrees C) oral BP sitting:   130 / 60  (left arm) Cuff size:   regular  Vitals Entered By: Lucious Groves CMA (March 04, 2010 9:30 AM) CC: 4 MO RTN OV./KB Is Patient Diabetic? No Pain Assessment Patient in pain? no      Comments Patient notes that he has noticed some fatigue, but is also aware his bloodwork (Hg, etc) may have been low./kb   CC:  4 MO RTN OV./KB.  History of Present Illness:  Follow-Up Visit      This is a 77 year old man who presents for Follow-up visit.  The patient denies chest pain and palpitations.  Since the last visit the patient notes no new problems or concerns.  The patient reports taking meds as prescribed.  When questioned about possible medication side effects, the patient notes none.    All other systems reviewed and were negative except some muscle fatigue when he climbs stairs  Current Problems (verified): 1)  Anemia  (ICD-285.9) 2)  Hypertension  (ICD-401.9) 3)  Hyperlipidemia  (ICD-272.4) 4)  Diabetes Mellitus, Type II  (ICD-250.00)  Current Medications (verified): 1)  Losartan Potassium-Hctz 100-25 Mg Tabs (Losartan Potassium-Hctz) .... Take 1 Tablet By Mouth Once A Day 2)  Felodipine 5 Mg Tb24 (Felodipine) .... One By Mouth Daily 3)  Metformin Hcl 1000 Mg Tabs (Metformin Hcl) .... Take 1 Tablet By Mouth Two Times A Day 4)  Simvastatin 40 Mg Tabs (Simvastatin) .... One By Mouth Daily 5)  Bayer Aspirin 325 Mg  Tabs (Aspirin) .... One By Mouth Daily 6)  Fish Oil 1200 Mg Caps (Omega-3 Fatty Acids) .... Two Times A Day 7)  Centrum Silver  Tabs (Multiple Vitamins-Minerals) .... Once Daily 8)  Icaps  Caps (Multiple Vitamins-Minerals) .... Once Daily 9)  Optive 0.5-0.9 % Soln (Carboxymethylcellul-Glycerin) .... One Drop Both  Eyes Once Daily  Allergies (verified): No Known Drug Allergies  Past History:  Past Medical History: Last updated: 07/04/2006 Diabetes mellitus, type II Hyperlipidemia Hypertension  Past Surgical History: Last updated: 03/06/2007 cataracts  Family History: Last updated: 07/04/2006 Family History of CAD Male 1st degree relative <60 Family History of CAD Male 1st degree relative <50 Family History of Stroke F 1st degree relative <60  Social History: Last updated: 07/04/2006 Occupation: Married Former Smoker Alcohol use-no  Risk Factors: Exercise: yes (07/04/2006)  Risk Factors: Smoking Status: quit (10/27/2009)  Physical Exam  General:  alert and well-developed.   Head:  normocephalic and atraumatic.   Eyes:  pupils equal and pupils round.   Ears:  R ear normal and L ear normal.   Neck:  No deformities, masses, or tenderness noted. Chest Wall:  No deformities, masses, tenderness or gynecomastia noted. Lungs:  normal respiratory effort and no intercostal retractions.   Heart:  normal rate and regular rhythm.   Abdomen:  soft and non-tender.   Skin:  turgor normal and color normal.   Psych:  normally interactive and good eye contact.     Impression & Recommendations:  Problem # 1:  ANEMIA (ICD-285.9) needs f/u has had GI eval Hgb: 11.3 (10/27/2009)   Hct: 32.7 (10/27/2009)   Platelets: 219.0 (10/27/2009) RBC: 3.59 (  10/27/2009)   RDW: 13.2 (10/27/2009)   WBC: 11.1 (10/27/2009) MCV: 91.2 (10/27/2009)   MCHC: 34.6 (10/27/2009) Ferritin: 33.2 (10/27/2009) Iron: 65 (10/27/2009)   % Sat: 21.3 (10/27/2009) B12: 419 (10/27/2009)   Folate: >20.0 ng/mL (10/27/2009)   TSH: 2.47 (10/20/2009)  Orders: Venipuncture (16109) TLB-CBC Platelet - w/Differential (85025-CBCD) TLB-B12 + Folate Pnl (60454_09811-B14/NWG) TLB-IBC Pnl (Iron/FE;Transferrin) (83550-IBC) TLB-Ferritin (82728-FER)  Problem # 2:  HYPERTENSION (ICD-401.9) controlled continue current medications    His updated medication list for this problem includes:    Losartan Potassium-hctz 100-25 Mg Tabs (Losartan potassium-hctz) .[name]... Take 1 tablet by mouth once a day    Felodipine 5 Mg Tb24 (Felodipine) ..... One by mouth daily  BP today: 130/60 Prior BP: 112/58 (10/27/2009)  Prior 10 Yr Risk Heart Disease: 18 % (03/06/2007)  Labs Reviewed: K+: 4.2 (02/18/2010) Creat: : 1.2 (02/18/2010)   Chol: 125 (02/18/2010)   HDL: 36.20 (02/18/2010)   LDL: 67 (02/18/2010)   TG: 108.0 (02/18/2010)  Problem # 3:  HYPERLIPIDEMIA (ICD-272.4) controlled continue current medications  His updated medication list for this problem includes:    Simvastatin 40 Mg Tabs (Simvastatin) ..... One by mouth daily  Labs Reviewed: SGOT: 21 (02/18/2010)   SGPT: 20 (02/18/2010)  Lipid Goals: Chol Goal: 200 (03/06/2007)   HDL Goal: 40 (03/06/2007)   LDL Goal: 100 (03/06/2007)   TG Goal: 150 (03/06/2007)  Prior 10 Yr Risk Heart Disease: 18 % (03/06/2007)   HDL:36.20 (02/18/2010), 42.40 (10/20/2009)  LDL:67 (02/18/2010), 71 (10/20/2009)  Chol:125 (02/18/2010), 134 (10/20/2009)  Trig:108.0 (02/18/2010), 103.0 (10/20/2009)  Problem # 4:  DIABETES MELLITUS, TYPE II (ICD-250.00)  reasonable control continue current medications  His updated medication list for this problem includes:    Losartan Potassium-hctz 100-25 Mg Tabs (Losartan potassium-hctz) .[name]... Take 1 tablet by mouth once a day    Metformin Hcl 1000 Mg Tabs (Metformin hcl) .[name]... Take 1 tablet by mouth two times a day    Bayer Aspirin 325 Mg Tabs (Aspirin) ..... One by mouth daily  Labs Reviewed: Creat: 1.2 (02/18/2010)     Last Eye Exam: normal (03/19/2009) Reviewed HgBA1c results: 7.7 (02/18/2010)  7.1 (10/20/2009)  Complete Medication List: 1)  Losartan Potassium-hctz 100-25 Mg Tabs (Losartan potassium-hctz) .... Take 1 tablet by mouth once a day 2)  Felodipine 5 Mg Tb24 (Felodipine) .... One by mouth daily 3)  Metformin Hcl 1000 Mg Tabs (Metformin  hcl) .... Take 1 tablet by mouth two times a day 4)  Simvastatin 40 Mg Tabs (Simvastatin) .... One by mouth daily 5)  Bayer Aspirin 325 Mg Tabs (Aspirin) .... One by mouth daily 6)  Fish Oil 1200 Mg Caps (Omega-3 fatty acids) .... Two times a day 7)  Centrum Silver Tabs (Multiple vitamins-minerals) .... Once daily 8)  Icaps Caps (Multiple vitamins-minerals) .... Once daily 9)  Optive 0.5-0.9 % Soln (Carboxymethylcellul-glycerin) .... One drop both eyes once daily  Other Orders: Flu Vaccine 72yrs + MEDICARE PATIENTS (N5621) Administration Flu vaccine - MCR (H0865)  Patient Instructions: 1)  4 months              Flu Vaccine Consent Questions     Do you have a history of severe allergic reactions to this vaccine? no    Any prior history of allergic reactions to egg and/or gelatin? no    Do you have a sensitivity to the preservative Thimersol? no    Do you have a past history of Guillan-Barre Syndrome? no    Do you currently have an  acute febrile illness? no    Have you ever had a severe reaction to latex? no    Vaccine information given and explained to patient? yes    Are you currently pregnant? no    Lot Number:[id number]   Exp Date:12/05/2010   Site Given  Left Deltoid IMlu

## 2010-07-09 NOTE — Procedures (Signed)
Summary: Upper Endoscopy  Patient: [name] Note: All result statuses are Final unless otherwise noted.  Tests: (1) Upper Endoscopy (EGD)   EGD Upper Endoscopy       DONE     [facility]     [address]     [place], [place]  [address]           ENDOSCOPY PROCEDURE REPORT           PATIENT:  [name], [name]  MR#:  [id number]     BIRTHDATE:  Dec 29, 1931, 78 yrs. old  GENDER:  male           ENDOSCOPIST:  Vania Rea. Jarold Motto, MD, [facility]     Referred by:  Birdie Sons, M.D.           PROCEDURE DATE:  05/25/2010     PROCEDURE:  EGD with biopsy, 43239, Maloney Dilation of Esophagus     ASA CLASS:  Class II     INDICATIONS:  GERD, dysphagia, iron deficiency anemia           MEDICATIONS:   There was residual sedation effect present from     prior procedure.     TOPICAL ANESTHETIC:           DESCRIPTION OF PROCEDURE:   After the risks benefits and     alternatives of the procedure were thoroughly explained, informed     consent was obtained.  The LB [id number] endoscope was     introduced through the mouth and advanced to the second portion of     the duodenum, without limitations.  The instrument was slowly     withdrawn as the mucosa was fully examined.     <<PROCEDUREIMAGES>>           Normal duodenal folds were noted. BIOPSIES DONE.  Mild gastritis     was found in the body and the antrum of the stomach.     GRANULAR,HYPEREMIC MUCOSA NOTED.CLO BX. DONE.  The esophagus and     gastroesophageal junction were completely normal in appearance.     DILATED #70F MALONEY DILATOR.TOLERATED WELL.NO HEME OR PAIN.     Retroflexed views revealed no abnormalities.    The scope was then     withdrawn from the patient and the procedure completed.           COMPLICATIONS:  None           ENDOSCOPIC IMPRESSION:     1) Normal duodenal folds     2) Mild gastritis in the body and the antrum of the stomach     3) Normal esophagus     1.R/O CELIAC DISEASE AND IRON MALABSORPTION  2.R/O H.PYLORI GASTRITIS     3.PROBABLE OCCULT STRICTURE DILATED.     RECOMMENDATIONS:     1) Await biopsy results     2) Rx CLO if positive     3) continue current medications     4) Clear liquids until, then soft foods rest iof day. Resume     prior diet tomorrow.           REPEAT EXAM:  No           ______________________________     Vania Rea. Jarold Motto, MD, [facility]           CC:           n.     eSIGNED:   Vania Rea. Patterson at 05/25/2010 02:57 PM  Abou, [place], [id number]  Note: An exclamation mark (!) indicates a result that was not dispersed into the flowsheet. Document Creation Date: 05/25/2010 2:57 PM _______________________________________________________________________  (1) Order result status: Final Collection or observation date-time: 05/25/2010 14:48 Requested date-time:  Receipt date-time:  Reported date-time:  Referring Physician:   Ordering Physician: Sheryn Bison [id number]) Specimen Source:  Source: Launa Grill Order Number: [id number] Lab site:

## 2010-07-09 NOTE — Procedures (Signed)
Summary: Colonoscopy  Patient: [name] Note: All result statuses are Final unless otherwise noted.  Tests: (1) Colonoscopy (COL)   COL Colonoscopy           DONE     [facility]     [address]     [place], [place]  [address]           COLONOSCOPY PROCEDURE REPORT           PATIENT:  [name], [name]  MR#:  [id number]     BIRTHDATE:  1931-07-06, 78 yrs. old  GENDER:  male     ENDOSCOPIST:  Vania Rea. Jarold Motto, MD, [facility]     REF. BY:  Birdie Sons, M.D.     PROCEDURE DATE:  05/25/2010     PROCEDURE:  Surveillance Colonoscopy     ASA CLASS:  Class II     INDICATIONS:  Iron Deficiency Anemia, history of pre-cancerous     (adenomatous) colon polyps     MEDICATIONS:   Fentanyl 50 mcg IV, Versed 6 mg IV           DESCRIPTION OF PROCEDURE:   After the risks benefits and     alternatives of the procedure were thoroughly explained, informed     consent was obtained.  Digital rectal exam was performed and     revealed no abnormalities.   The LB [id number] endoscope     was introduced through the anus and advanced to the cecum, which     was identified by both the appendix and ileocecal valve, without     limitations.  The quality of the prep was excellent, using     MoviPrep.  The instrument was then slowly withdrawn as the colon     was fully examined.     <<PROCEDUREIMAGES>>           FINDINGS:  No polyps or cancers were seen.  This was otherwise a     normal examination of the colon.   Retroflexed views in the rectum     revealed no abnormalities.    The scope was then withdrawn from     the patient and the procedure completed.           COMPLICATIONS:  None     ENDOSCOPIC IMPRESSION:     1) No polyps or cancers     2) Otherwise normal examination     RECOMMENDATIONS:     1) Continue current colorectal screening recommendations for     "routine risk" patients with a repeat colonoscopy in 10 years.     2) Upper endoscopy will be scheduled     REPEAT EXAM:  No       ______________________________     Vania Rea. Jarold Motto, MD, [facility]           CC:           n.     eSIGNED:   Vania Rea. Patterson at 05/25/2010 02:43 PM           Thayer Ohm, [id number]  Note: An exclamation mark (!) indicates a result that was not dispersed into the flowsheet. Document Creation Date: 05/25/2010 2:43 PM _______________________________________________________________________  (1) Order result status: Final Collection or observation date-time: 05/25/2010 14:38 Requested date-time:  Receipt date-time:  Reported date-time:  Referring Physician:   Ordering Physician: Sheryn Bison [id number]) Specimen Source:  Source: Launa Grill Order Number: [id number] Lab site:   Appended Document: Colonoscopy    Clinical  Lists Changes  Observations: Added new observation of COLONNXTDUE: 05/2020 (05/25/2010 15:22)

## 2010-07-09 NOTE — Letter (Signed)
Summary: Patient Notice-Endo Biopsy Results  [facility]  [address] [place], [place] [address]   Phone: [phone]  Fax: [phone]        May 27, 2010 MRN: [id number]    [name] [address] [place], [place]  [address]    Dear Mr. ARSCOTT,  I am pleased to inform you that the biopsies taken during your recent endoscopic examination did not show any evidence of cancer upon pathologic examination.  Additional information/recommendations:  __No further action is needed at this time.  Please follow-up with      your primary care physician for your other healthcare needs.  _X_ Please call [phone] to schedule a return visit to review      your condition.BIOPSIES WERE NORMAL.  __ Continue with the treatment plan as outlined on the day of your      exam.  __ You should have a repeat endoscopic examination for this problem              in _ months/years.   Please call us if you are having persistent problems or have questions about your condition that have not been fully answered at this time.  Sincerely,  Mardella Layman MD [facility]  This letter has been electronically signed by your physician.  Appended Document: Patient Notice-Endo Biopsy Results Letter mailed

## 2010-07-09 NOTE — Assessment & Plan Note (Signed)
Summary: 4 month rov/njr---PT RSC (BMP) // RS   Vital Signs:  Patient profile:   77 year old male Height:      64 inches Weight:      132 pounds BMI:     22.74 Pulse rate:   68 / minute Resp:     12 per minute BP sitting:   114 / 62  (left arm)  Vitals Entered By: Gladis Riffle, RN (July 07, 2009 9:54 AM) CC: 4 month rov, labs done Is Patient Diabetic? Yes Did you bring your meter with you today? No   CC:  4 month rov and labs done.  History of Present Illness:  Follow-Up Visit      This is a 77 year old man who presents for Follow-up visit.  The patient denies chest pain and palpitations.  Since the last visit the patient notes no new problems or concerns.  The patient reports taking meds as prescribed.  When questioned about possible medication side effects, the patient notes none.    All other systems reviewed and were negative   Preventive Screening-Counseling & Management  Alcohol-Tobacco     Smoking Status: quit  Current Problems (verified): 1)  Hypertension  (ICD-401.9) 2)  Hyperlipidemia  (ICD-272.4) 3)  Diabetes Mellitus, Type II  (ICD-250.00)  Current Medications (verified): 1)  Losartan Potassium-Hctz 100-25 Mg Tabs (Losartan Potassium-Hctz) .... Take 1 Tablet By Mouth Once A Day 2)  Felodipine 5 Mg Tb24 (Felodipine) .... One By Mouth Daily 3)  Metformin Hcl 1000 Mg Tabs (Metformin Hcl) .... Take 1 Tablet By Mouth Two Times A Day 4)  Simvastatin 40 Mg Tabs (Simvastatin) .... One By Mouth Daily 5)  Bayer Aspirin 325 Mg  Tabs (Aspirin) .... One By Mouth Daily 6)  Fish Oil 1200 Mg Caps (Omega-3 Fatty Acids) .... Two Times A Day 7)  Centrum Silver  Tabs (Multiple Vitamins-Minerals) .... Once Daily 8)  Icaps  Caps (Multiple Vitamins-Minerals) .... Once Daily 9)  Optive 0.5-0.9 % Soln (Carboxymethylcellul-Glycerin) .... One Drop Both Eyes Once Daily  Allergies (verified): No Known Drug Allergies  Comments:  Nurse/Medical Assistant: 4 month rov, labs  done--does not check CBGs at home  The patient's medications and allergies were reviewed with the patient and were updated in the Medication and Allergy Lists. Gladis Riffle, RN (July 07, 2009 9:59 AM)  Past History:  Past Medical History: Last updated: 07/04/2006 Diabetes mellitus, type II Hyperlipidemia Hypertension  Past Surgical History: Last updated: 03/06/2007 cataracts  Family History: Last updated: 07/04/2006 Family History of CAD Male 1st degree relative <60 Family History of CAD Male 1st degree relative <50 Family History of Stroke F 1st degree relative <60  Social History: Last updated: 07/04/2006 Occupation: Married Former Smoker Alcohol use-no  Risk Factors: Exercise: yes (07/04/2006)  Risk Factors: Smoking Status: quit (07/07/2009)  Physical Exam  General:  alert, well-developed, well-nourished, and well-hydrated, no distress Head:  normocephalic and atraumatic.   Eyes:  pupils equal and pupils round.   Ears:  R ear normal and L ear normal.   Neck:  No deformities, masses, or tenderness noted. Chest Wall:  No deformities, masses, tenderness or gynecomastia noted. Lungs:  normal respiratory effort and no intercostal retractions.   Heart:  normal rate and regular rhythm.   Abdomen:  soft and non-tender.   Pulses:  R radial normal and L radial normal.   Neurologic:  cranial nerves II-XII intact and gait normal.     Impression & Recommendations:  Problem # 1:  HYPERTENSION (ICD-401.9)  His updated medication list for this problem includes:    Losartan Potassium-hctz 100-25 Mg Tabs (Losartan potassium-hctz) .[name]... Take 1 tablet by mouth once a day    Felodipine 5 Mg Tb24 (Felodipine) ..... One by mouth daily  BP today: 114/62 Prior BP: 120/62 (02/28/2009)  Prior 10 Yr Risk Heart Disease: 18 % (03/06/2007)  Labs Reviewed: K+: 4.4 (06/18/2009) Creat: : 1.1 (06/18/2009)   Chol: 157 (06/18/2009)   HDL: 47.70 (06/18/2009)   LDL: 93 (06/18/2009)    TG: 83.0 (06/18/2009)  Problem # 2:  HYPERLIPIDEMIA (ICD-272.4)  His updated medication list for this problem includes:    Simvastatin 40 Mg Tabs (Simvastatin) ..... One by mouth daily  Labs Reviewed: SGOT: 21 (06/18/2009)   SGPT: 21 (06/18/2009)  Lipid Goals: Chol Goal: 200 (03/06/2007)   HDL Goal: 40 (03/06/2007)   LDL Goal: 100 (03/06/2007)   TG Goal: 150 (03/06/2007)  Prior 10 Yr Risk Heart Disease: 18 % (03/06/2007)   HDL:47.70 (06/18/2009), 45.20 (02/21/2009)  LDL:93 (06/18/2009), 74 (02/21/2009)  Chol:157 (06/18/2009), 130 (02/21/2009)  Trig:83.0 (06/18/2009), 52.0 (02/21/2009)  Problem # 3:  DIABETES MELLITUS, TYPE II (ICD-250.00)  His updated medication list for this problem includes:    Losartan Potassium-hctz 100-25 Mg Tabs (Losartan potassium-hctz) .[name]... Take 1 tablet by mouth once a day    Metformin Hcl 1000 Mg Tabs (Metformin hcl) .[name]... Take 1 tablet by mouth two times a day    Bayer Aspirin 325 Mg Tabs (Aspirin) ..... One by mouth daily  Labs Reviewed: Creat: 1.1 (06/18/2009)     Last Eye Exam: normal (03/19/2009) Reviewed HgBA1c results: 7.2 (06/18/2009)  6.9 (02/21/2009)  Complete Medication List: 1)  Losartan Potassium-hctz 100-25 Mg Tabs (Losartan potassium-hctz) .... Take 1 tablet by mouth once a day 2)  Felodipine 5 Mg Tb24 (Felodipine) .... One by mouth daily 3)  Metformin Hcl 1000 Mg Tabs (Metformin hcl) .... Take 1 tablet by mouth two times a day 4)  Simvastatin 40 Mg Tabs (Simvastatin) .... One by mouth daily 5)  Bayer Aspirin 325 Mg Tabs (Aspirin) .... One by mouth daily 6)  Fish Oil 1200 Mg Caps (Omega-3 fatty acids) .... Two times a day 7)  Centrum Silver Tabs (Multiple vitamins-minerals) .... Once daily 8)  Icaps Caps (Multiple vitamins-minerals) .... Once daily 9)  Optive 0.5-0.9 % Soln (Carboxymethylcellul-glycerin) .... One drop both eyes once daily  Patient Instructions: 1)  Please schedule a follow-up appointment in 4 months. 2)  labs one  week prior to visit 3)  lipids---272.4 4)  lfts-995.2 5)  bmet-995.2 6)  A1C-250.02 7)     8)  psa---v70.0

## 2010-07-09 NOTE — Assessment & Plan Note (Signed)
Summary: 4 month rov/nrj   Vital Signs:  Patient profile:   77 year old male Weight:      136.5 pounds BMI:     23.51 Pulse rate:   72 / minute Pulse rhythm:   regular Resp:     12 per minute BP sitting:   112 / 58  (left arm) Cuff size:   regular  Vitals Entered By: Gladis Riffle, RN (Oct 27, 2009 9:39 AM) CC: 4 month rov, labs done--does not check CBGs at home Is Patient Diabetic? Yes Did you bring your meter with you today? No   CC:  4 month rov and labs done--does not check CBGs at home.  History of Present Illness:  Follow-Up Visit      This is a 77 year old man who presents for Follow-up visit.  The patient denies chest pain and palpitations.  Since the last visit the patient notes no new problems or concerns.  The patient reports taking meds as prescribed.  When questioned about possible medication side effects, the patient notes none.    All other systems reviewed and were negative   Preventive Screening-Counseling & Management  Alcohol-Tobacco     Smoking Status: quit  Current Problems (verified): 1)  Hypertension  (ICD-401.9) 2)  Hyperlipidemia  (ICD-272.4) 3)  Diabetes Mellitus, Type II  (ICD-250.00)  Current Medications (verified): 1)  Losartan Potassium-Hctz 100-25 Mg Tabs (Losartan Potassium-Hctz) .... Take 1 Tablet By Mouth Once A Day 2)  Felodipine 5 Mg Tb24 (Felodipine) .... One By Mouth Daily 3)  Metformin Hcl 1000 Mg Tabs (Metformin Hcl) .... Take 1 Tablet By Mouth Two Times A Day 4)  Simvastatin 40 Mg Tabs (Simvastatin) .... One By Mouth Daily 5)  Bayer Aspirin 325 Mg  Tabs (Aspirin) .... One By Mouth Daily 6)  Fish Oil 1200 Mg Caps (Omega-3 Fatty Acids) .... Two Times A Day 7)  Centrum Silver  Tabs (Multiple Vitamins-Minerals) .... Once Daily 8)  Icaps  Caps (Multiple Vitamins-Minerals) .... Once Daily 9)  Optive 0.5-0.9 % Soln (Carboxymethylcellul-Glycerin) .... One Drop Both Eyes Once Daily  Allergies (verified): No Known Drug Allergies  Past  History:  Past Medical History: Last updated: 07/04/2006 Diabetes mellitus, type II Hyperlipidemia Hypertension  Past Surgical History: Last updated: 03/06/2007 cataracts  Family History: Last updated: 07/04/2006 Family History of CAD Male 1st degree relative <60 Family History of CAD Male 1st degree relative <50 Family History of Stroke F 1st degree relative <60  Social History: Last updated: 07/04/2006 Occupation: Married Former Smoker Alcohol use-no  Risk Factors: Exercise: yes (07/04/2006)  Risk Factors: Smoking Status: quit (10/27/2009)  Review of Systems       All other systems reviewed and were negative   Physical Exam  General:  alert and well-developed.   Head:  normocephalic and atraumatic.   Eyes:  pupils equal and pupils round.   Ears:  R ear normal and L ear normal.   Nose:  no external deformity and no external erythema.   Neck:  No deformities, masses, or tenderness noted. Chest Wall:  No deformities, masses, tenderness or gynecomastia noted. Lungs:  normal respiratory effort and no intercostal retractions.   Heart:  normal rate and regular rhythm.   Abdomen:  soft and non-tender.   Msk:  No deformity or scoliosis noted of thoracic or lumbar spine.   Pulses:  R radial normal and L radial normal.   Neurologic:  cranial nerves II-XII intact and gait normal.     Impression &  Recommendations:  Problem # 1:  HYPERTENSION (ICD-401.9) controlled continue current medications  His updated medication list for this problem includes:    Losartan Potassium-hctz 100-25 Mg Tabs (Losartan potassium-hctz) .[name]... Take 1 tablet by mouth once a day    Felodipine 5 Mg Tb24 (Felodipine) ..... One by mouth daily  BP today: 112/58 Prior BP: 114/62 (07/07/2009)  Prior 10 Yr Risk Heart Disease: 18 % (03/06/2007)  Labs Reviewed: K+: 5.1 (10/20/2009) Creat: : 1.3 (10/20/2009)   Chol: 134 (10/20/2009)   HDL: 42.40 (10/20/2009)   LDL: 71 (10/20/2009)   TG: 103.0  (10/20/2009)  Problem # 2:  HYPERLIPIDEMIA (ICD-272.4) controlled continue current medications  His updated medication list for this problem includes:    Simvastatin 40 Mg Tabs (Simvastatin) ..... One by mouth daily  Labs Reviewed: SGOT: 23 (10/20/2009)   SGPT: 22 (10/20/2009)  Lipid Goals: Chol Goal: 200 (03/06/2007)   HDL Goal: 40 (03/06/2007)   LDL Goal: 100 (03/06/2007)   TG Goal: 150 (03/06/2007)  Prior 10 Yr Risk Heart Disease: 18 % (03/06/2007)   HDL:42.40 (10/20/2009), 47.70 (06/18/2009)  LDL:71 (10/20/2009), 93 (06/18/2009)  Chol:134 (10/20/2009), 157 (06/18/2009)  Trig:103.0 (10/20/2009), 83.0 (06/18/2009)  Problem # 3:  DIABETES MELLITUS, TYPE II (ICD-250.00) controlled continue current medications  His updated medication list for this problem includes:    Losartan Potassium-hctz 100-25 Mg Tabs (Losartan potassium-hctz) .[name]... Take 1 tablet by mouth once a day    Metformin Hcl 1000 Mg Tabs (Metformin hcl) .[name]... Take 1 tablet by mouth two times a day    Bayer Aspirin 325 Mg Tabs (Aspirin) ..... One by mouth daily  Labs Reviewed: Creat: 1.3 (10/20/2009)     Last Eye Exam: normal (03/19/2009) Reviewed HgBA1c results: 7.1 (10/20/2009)  7.2 (06/18/2009)  Problem # 4:  ANEMIA (ICD-285.9) Assessment: New needs further evaluation see labs  Orders: Venipuncture (60454) TLB-B12 + Folate Pnl (09811_91478-G95/AOZ) TLB-IBC Pnl (Iron/FE;Transferrin) (83550-IBC) TLB-Ferritin (82728-FER) TLB-CBC Platelet - w/Differential (85025-CBCD) T-Pathologist Smear Review (10080) T-Immunofixation Electrophoresis, Serum  (82784/[id number])  Complete Medication List: 1)  Losartan Potassium-hctz 100-25 Mg Tabs (Losartan potassium-hctz) .... Take 1 tablet by mouth once a day 2)  Felodipine 5 Mg Tb24 (Felodipine) .... One by mouth daily 3)  Metformin Hcl 1000 Mg Tabs (Metformin hcl) .... Take 1 tablet by mouth two times a day 4)  Simvastatin 40 Mg Tabs (Simvastatin) .... One by mouth  daily 5)  Bayer Aspirin 325 Mg Tabs (Aspirin) .... One by mouth daily 6)  Fish Oil 1200 Mg Caps (Omega-3 fatty acids) .... Two times a day 7)  Centrum Silver Tabs (Multiple vitamins-minerals) .... Once daily 8)  Icaps Caps (Multiple vitamins-minerals) .... Once daily 9)  Optive 0.5-0.9 % Soln (Carboxymethylcellul-glycerin) .... One drop both eyes once daily  Patient Instructions: 1)  Please schedule a follow-up appointment in 4 months. 2)  labs one week prior to visit 3)  lipids---272.4 4)  lfts-995.2 5)  bmet-995.2 6)  A1C-250.02 7)

## 2010-07-09 NOTE — Letter (Signed)
Summary: Eye Exam/[facility]  Eye Exam/[facility]   Imported By: Maryln Gottron 04/17/2010 13:45:01  _____________________________________________________________________  External Attachment:    Type:   Image     Comment:   External Document

## 2010-07-09 NOTE — Assessment & Plan Note (Signed)
Summary: ANEMIA/yf    History of Present Illness Visit Type: Initial Consult Primary GI MD: Sheryn Bison MD FACP FAGA Primary Provider: Leeanne Deed Requesting Provider: Leeanne Deed Chief Complaint: Anemia pt also c/o some blurred vision and fatique History of Present Illness:   77 year old Caucasian male who has had 8 months of rather classic acid reflux with burning substernal chest pain worse after meals and at bedtime. He said some intermittent dysphagia for solid foods, but no aspiration, anorexia or weight loss. He is being changed from regular aspirin to enteric-coated aspirin 325 mg a day. He has a history of hypertension hyperlipidemia but no other cardiovascular or peripheral vascular problems.  Blood work has shown a mild iron deficiency anemia with a hemoglobin of 11.5. B12 and folate levels were normal. He denies symptoms of hypovolemia sepsis and mild fatigue. His appetite is good and his weight is stable. He had colonoscopy done 6 years ago which was unremarkable. He has never had endoscopic exam or barium studies of the upper GI tract. There was a large 2 cm polyp removed in 1997.Family History this possibly positive for an unknown type of gastrointestinal cancer in his mother who is currently age 67.   GI Review of Systems    Reports acid reflux, dysphagia with solids, heartburn, and  weight loss.      Denies abdominal pain, belching, bloating, chest pain, dysphagia with liquids, loss of appetite, nausea, vomiting, vomiting blood, and  weight gain.        Denies anal fissure, black tarry stools, change in bowel habit, constipation, diarrhea, diverticulosis, fecal incontinence, heme positive stool, hemorrhoids, irritable bowel syndrome, jaundice, light color stool, liver problems, rectal bleeding, and  rectal pain. Preventive Screening-Counseling & Management      Drug Use:  no.      Current Medications (verified): 1)  Losartan Potassium-Hctz 100-25 Mg Tabs  (Losartan Potassium-Hctz) .... Take 1 Tablet By Mouth Once A Day 2)  Felodipine 5 Mg Tb24 (Felodipine) .... One By Mouth Daily 3)  Metformin Hcl 1000 Mg Tabs (Metformin Hcl) .... Take 1 Tablet By Mouth Two Times A Day 4)  Simvastatin 40 Mg Tabs (Simvastatin) .... One By Mouth Daily 5)  Ecotrin 325 Mg Tbec (Aspirin) .[name].. 1 By Mouth Once Daily 6)  Fish Oil 1200 Mg Caps (Omega-3 Fatty Acids) .... Two Times A Day 7)  Centrum Silver  Tabs (Multiple Vitamins-Minerals) .... Once Daily 8)  Icaps  Caps (Multiple Vitamins-Minerals) .... Once Daily 9)  Optive 0.5-0.9 % Soln (Carboxymethylcellul-Glycerin) .... One Drop Both Eyes Once Daily  Allergies (verified): No Known Drug Allergies  Past History:  Past medical, surgical, family and social histories (including risk factors) reviewed for relevance to current acute and chronic problems.  Past Medical History: Reviewed history from 04/20/2010 and no changes required. Diabetes mellitus, type II Hyperlipidemia Hypertension Colon Polyps: 1996 villous adenoma.  Past Surgical History: Reviewed history from 03/06/2007 and no changes required. cataracts  Family History: Reviewed history from 07/04/2006 and no changes required. Family History of CAD Male 1st degree relative <60 Family History of CAD Male 1st degree relative <50 Family History of Stroke F 1st degree relative <60 stomach Cancer-mother  passed at 29  Social History: Reviewed history from 07/04/2006 and no changes required. Occupation:Factory work Married Former Smoker Alcohol use-no Illicit Drug Use - no 3 childrenDrug Use:  no  Review of Systems       The patient complains of fatigue.  The patient denies allergy/sinus, anemia, anxiety-new, arthritis/joint  pain, back pain, blood in urine, breast changes/lumps, change in vision, confusion, cough, coughing up blood, depression-new, fainting, fever, headaches-new, hearing problems, heart murmur, heart rhythm changes, itching,  muscle pains/cramps, night sweats, nosebleeds, shortness of breath, skin rash, sleeping problems, sore throat, swelling of feet/legs, swollen lymph glands, thirst - excessive, urination - excessive, urination changes/pain, urine leakage, vision changes, and voice change.    Vital Signs:  Patient profile:   77 year old male Height:      64 inches Weight:      132 pounds BMI:     22.74 BSA:     1.64 Pulse rate:   68 / minute Pulse rhythm:   regular BP sitting:   142 / 62  (left arm)  Vitals Entered By: Merri Ray CMA Duncan Dull) (April 21, 2010 8:58 AM)  Physical Exam  General:  Well developed, well nourished, no acute distress.healthy appearing.   Head:  Normocephalic and atraumatic. Eyes:  PERRLA, no icterus.exam deferred to patient's ophthalmologist.   Neck:  Supple; no masses or thyromegaly. Lungs:  Clear throughout to auscultation. Heart:  Regular rate and rhythm; no murmurs, rubs,  or bruits. Abdomen:  Soft, nontender and nondistended. No masses, hepatosplenomegaly or hernias noted. Normal bowel sounds. Rectal:  deferred until time of colonoscopy.   Msk:  Symmetrical with no gross deformities. Normal posture. Extremities:  No clubbing, cyanosis, edema or deformities noted. Neurologic:  Alert and  oriented x4;  grossly normal neurologically. Psych:  Alert and cooperative. Normal mood and affect.   Impression & Recommendations:  Problem # 1:  GERD (ICD-530.81) Assessment Deteriorated Reflux Regime Reviewed and we will start Dexilant 60 mg 30 minutes before breakfast. Endoscopy is been scheduled at his convenience. I suspect he does have some early esophageal stricturing from chronic GERD. Other consideration would be chronic NSAID-induced gastric or duodenal ulcerations. Orders: Colon/Endo (Colon/Endo)  Problem # 2:  COLONIC POLYPS, ADENOMATOUS, HX OF (ICD-V12.72) Assessment: Unchanged followup colonoscopy also scheduled with adjustment of his diabetic  medications.  Problem # 3:  ANEMIA (ICD-285.9) Assessment: Improved  Orders: Colon/Endo (Colon/Endo)  Problem # 4:  DIABETES MELLITUS, TYPE II (ICD-250.00) Assessment: Improved He Has Non-Insulin-Dependent diabetes and adjustments in his diabetic medications appropriate for his procedure are planned..  Patient Instructions: 1)  Copy sent to : Leeanne Deed 2)  Your prescription(s) have been sent to you pharmacy.  3)  Your procedure has been scheduled for 05/13/2010, please follow the seperate instructions.  4)  [facility] Patient Information Guide given to patient.  5)  Avoid foods high in acid content ( tomatoes, citrus juices, spicy foods) . Avoid eating within 3 to 4 hours of lying down or before exercising. Do not over eat; try smaller more frequent meals. Elevate head of bed four inches when sleeping.  6)  Colonoscopy and Flexible Sigmoidoscopy brochure given.  7)  GI Reflux brochure given.  8)  Upper Endoscopy brochure given.  9)  The medication list was reviewed and reconciled.  All changed / newly prescribed medications were explained.  A complete medication list was provided to the patient / caregiver. Prescriptions: MOVIPREP 100 GM  SOLR (PEG-KCL-NACL-NASULF-NA ASC-C) As per prep instructions.  #1 x 0   Entered by:   Harlow Mares CMA (AAMA)   Authorized by:   Mardella Layman MD [facility]   Signed by:   Mardella Layman MD [facility] on 04/21/2010   Method used:   Electronically to        [id number] (ret[facility])       [address]       [place], [place]  [address]       Ph: [phone] or [phone]       Fax: [phone]   RxID:   [id number] DEXILANT 60 MG CPDR (DEXLANSOPRAZOLE) take one by mouth once daily  #30 x 3   Entered by:   Harlow Mares CMA (AAMA)   Authorized by:   Mardella Layman MD [facility]   Signed by:   Mardella Layman MD [facility] on 04/21/2010   Method used:   Electronically to        [id number] (retail)       [address]        [place], [place]  [address]       Ph: [phone] or [phone]       Fax: [phone]   RxID:   [id number]

## 2010-07-09 NOTE — Assessment & Plan Note (Signed)
Summary: follow up ECl/sheri    History of Present Illness Visit Type: Follow-up Visit Primary GI MD: Sheryn Bison MD FACP FAGA Primary Provider: Birdie Sons, MD Requesting Provider: n/a Chief Complaint: Patient is here for follow up after egd/colon. He states that he is doing well with the exception of burning in epigastrium. History of Present Illness:   [name] has some dyspepsia after metformin ingestion. He denies other gastrointestinal problems. He does have some nonspecific gas and bloating. I reviewed his records in detail and he has had guaiac-negative stools. Endoscopy and recent colonoscopy have otherwise been unremarkable except for some nonspecific gastritis probably related to salicylate use. Biopsies for celiac disease and Helicobacter were negative. He denies anorexia, weight loss, nausea and vomiting, or symptoms of gastroparesis. He is followed closely for his adult onset diabetes by Dr. Cato Mulligan.There is no history of peripheral neuropathy, ophthalmologic, or renal complications.   GI Review of Systems    Reports acid reflux and  heartburn.      Denies abdominal pain, belching, bloating, chest pain, dysphagia with liquids, dysphagia with solids, loss of appetite, nausea, vomiting, vomiting blood, weight loss, and  weight gain.        Denies anal fissure, black tarry stools, change in bowel habit, constipation, diarrhea, diverticulosis, fecal incontinence, heme positive stool, hemorrhoids, irritable bowel syndrome, jaundice, light color stool, liver problems, rectal bleeding, and  rectal pain. Preventive Screening-Counseling & Management  Caffeine-Diet-Exercise     Does Patient Exercise: yes    Current Medications (verified): 1)  Losartan Potassium-Hctz 100-25 Mg Tabs (Losartan Potassium-Hctz) .... Take 1 Tablet By Mouth Once A Day 2)  Felodipine 5 Mg Tb24 (Felodipine) .... One By Mouth Daily 3)  Metformin Hcl 1000 Mg Tabs (Metformin Hcl) .... Take 1 Tablet By Mouth Two  Times A Day 4)  Simvastatin 40 Mg Tabs (Simvastatin) .... One By Mouth Daily 5)  Ecotrin 325 Mg Tbec (Aspirin) .[name].. 1 By Mouth Once Daily 6)  Fish Oil 1200 Mg Caps (Omega-3 Fatty Acids) .... Two Times A Day 7)  Centrum Silver  Tabs (Multiple Vitamins-Minerals) .... Once Daily 8)  Icaps  Caps (Multiple Vitamins-Minerals) .... Once Daily 9)  Optive 0.5-0.9 % Soln (Carboxymethylcellul-Glycerin) .... One Drop Both Eyes Once Daily 10)  Dexilant 60 Mg Cpdr (Dexlansoprazole) .... Take One By Mouth Once Daily  Allergies (verified): No Known Drug Allergies  Past History:  Past medical, surgical, family and social histories (including risk factors) reviewed for relevance to current acute and chronic problems.  Past Medical History: Diabetes mellitus, type II Hyperlipidemia Hypertension Colon Polyps: 1996 villous adenoma. Gastritis  Past Surgical History: cataracts-bilateral  Family History: Reviewed history from 04/21/2010 and no changes required. Family History of CAD Male 1st degree relative <60 Family History of CAD Male 1st degree relative <50: Father, Brother Family History of Stroke F 1st degree relative <60 stomach Cancer-mother  passed at 65 No FH of Colon Cancer:  Social History: Reviewed history from 04/21/2010 and no changes required. Occupation:Factory work Married Former Smoker-stopped 19060 Alcohol use-no Illicit Drug Use - no 3 children Patient gets regular exercise.  Review of Systems       The patient complains of back pain and skin rash.  The patient denies allergy/sinus, anemia, anxiety-new, arthritis/joint pain, blood in urine, breast changes/lumps, change in vision, confusion, cough, coughing up blood, depression-new, fainting, fatigue, fever, headaches-new, hearing problems, heart murmur, heart rhythm changes, itching, menstrual pain, muscle pains/cramps, night sweats, nosebleeds, pregnancy symptoms, shortness of breath, sleeping problems, sore  throat,  swelling of feet/legs, swollen lymph glands, thirst - excessive, urination - excessive, urination changes/pain, urine leakage, vision changes, and voice change.    Vital Signs:  Patient profile:   77 year old male Height:      64 inches Weight:      129.50 pounds BMI:     22.31 BSA:     1.63 Pulse rate:   64 / minute Pulse rhythm:   regular BP sitting:   114 / 48  (right arm)  Vitals Entered By: Lamona Curl CMA Duncan Dull) (June 16, 2010 3:29 PM)  Physical Exam  General:  Well developed, well nourished, no acute distress.healthy appearing.   Head:  Normocephalic and atraumatic. Eyes:  PERRLA, no icterus. Abdomen:  Soft, nontender and nondistended. No masses, hepatosplenomegaly or hernias noted. Normal bowel sounds.There is no abdominal distention, bruits, masses or tenderness. Psych:  Alert and cooperative. Normal mood and affect.   Impression & Recommendations:  Problem # 1:  NAUSEA (ICD-787.02) Assessment Improved The Patient has had no improvement with PPI therapy. He has nonspecific gastritis which probably is not related to metformin. I placed him on Carafate suspension 2 teaspoons after meals and at bedtime, and we'll check abdominal ultrasound exam to be complete. Review of his labs otherwise were unremarkable. He is to stop his Dexilant therapy. Orders: Ultrasound Abdomen (UAS)  Problem # 2:  EPIGASTRIC PAIN (ICD-789.06) Assessment: Improved Trial of probiotic therapy---daily Align. Orders: Ultrasound Abdomen (UAS)  Problem # 3:  GASTRITIS (ICD-535.50) Assessment: Unchanged he apparently needs to continue Ecotrin therapy. Evaluation for Helicobacter infection was negative. We will see how he does symptomatically with Carafate suspension. He hasn't wanted to see Dr. Cato Mulligan in 2 weeks' time and should probably have followup of his anemia and his diabetes at that time.  Patient Instructions: 1)  Copy sent to : Birdie Sons, MD 2)  Your abdominal ultrasound is  scheduled for 06/19/2010, please follow the seperate instructions.  3)  Take Align once a day.  4)  Stop your Dexilant.  5)  Take the Carafate as directed.  6)  The medication list was reviewed and reconciled.  All changed / newly prescribed medications were explained.  A complete medication list was provided to the patient / caregiver. Prescriptions: CARAFATE 1 GM/10ML SUSP (SUCRALFATE) take 2 teaspoons after meals and at bedtime  #1 month x 6   Entered by:   Harlow Mares CMA (AAMA)   Authorized by:   Mardella Layman MD [facility]   Signed by:   Mardella Layman MD [facility] on 06/16/2010   Method used:   Electronically to        [id number]       [address]       [place], [place]  [address]       Ph: [phone] or [phone]       Fax: [phone]   RxID:   [id number]

## 2010-07-09 NOTE — Miscellaneous (Signed)
Summary: Clo test  Clinical Lists Changes  Problems: Added new problem of GASTRITIS (ICD-535.50) Orders: Added new Test order of TLB-H Pylori Screen Gastric Biopsy (83013-CLOTEST) - Signed 

## 2010-07-09 NOTE — Letter (Signed)
Summary: [facility] Instructions  [facility]  [address] [place], [place] [address]   Phone: [phone]  Fax: [phone]       JERIAN MORAIS    [birth year]    MRN: [id number]        Procedure Day /Date: 05/13/2010 Wednesday     Arrival Time: 2:30pm     Procedure Time: 3:30pm     Location of Procedure:                    X  [facility] (4th Floor)                        PREPARATION FOR COLONOSCOPY WITH MOVIPREP   Starting 5 days prior to your procedure 05/08/2010 do not eat nuts, seeds, popcorn, corn, beans, peas,  salads, or any raw vegetables.  Do not take any fiber supplements (e.g. Metamucil, Citrucel, and Benefiber).  THE DAY BEFORE YOUR PROCEDURE         DATE: 05/12/2010  DAY: Tuesday  1.  Drink clear liquids the entire day-NO SOLID FOOD  2.  Do not drink anything colored red or purple.  Avoid juices with pulp.  No orange juice.  3.  Drink at least 64 oz. (8 glasses) of fluid/clear liquids during the day to prevent dehydration and help the prep work efficiently.  CLEAR LIQUIDS INCLUDE: Water Jello Ice Popsicles Tea (sugar ok, no milk/cream) Powdered fruit flavored drinks Coffee (sugar ok, no milk/cream) Gatorade Juice: apple, white grape, white cranberry  Lemonade Clear bullion, consomm, broth Carbonated beverages (any kind) Strained chicken noodle soup Hard Candy                             4.  In the morning, mix first dose of MoviPrep solution:    Empty 1 Pouch A and 1 Pouch B into the disposable container    Add lukewarm drinking water to the top line of the container. Mix to dissolve    Refrigerate (mixed solution should be used within 24 hrs)  5.  Begin drinking the prep at 5:00 p.m. The MoviPrep container is divided by 4 marks.   Every 15 minutes drink the solution down to the next mark (approximately 8 oz) until the full liter is complete.   6.  Follow completed prep with 16 oz of clear liquid of your choice (Nothing  red or purple).  Continue to drink clear liquids until bedtime.  7.  Before going to bed, mix second dose of MoviPrep solution:    Empty 1 Pouch A and 1 Pouch B into the disposable container    Add lukewarm drinking water to the top line of the container. Mix to dissolve    Refrigerate  THE DAY OF YOUR PROCEDURE      DATE: 05/13/2010  DAY: Wednesday  Beginning at 10:30am (5 hours before procedure):         1. Every 15 minutes, drink the solution down to the next mark (approx 8 oz) until the full liter is complete.  2. Follow completed prep with 16 oz. of clear liquid of your choice.    3. You may drink clear liquids until 1:30pm (2 HOURS BEFORE PROCEDURE).   MEDICATION INSTRUCTIONS  Unless otherwise instructed, you should take regular prescription medications with a small sip of water   as early as possible the morning of your procedure.  Diabetic  patients - see separate instructions.        OTHER INSTRUCTIONS  You will need a responsible adult at least 75 years of age to accompany you and drive you home.   This person must remain in the waiting room during your procedure.  Wear loose fitting clothing that is easily removed.  Leave jewelry and other valuables at home.  However, you may wish to bring a book to read or  an iPod/MP3 player to listen to music as you wait for your procedure to start.  Remove all body piercing jewelry and leave at home.  Total time from sign-in until discharge is approximately 2-3 hours.  You should go home directly after your procedure and rest.  You can resume normal activities the  day after your procedure.  The day of your procedure you should not:   Drive   Make legal decisions   Operate machinery   Drink alcohol   Return to work  You will receive specific instructions about eating, activities and medications before you leave.    The above instructions have been reviewed and explained to me by    _______________________    I fully understand and can verbalize these instructions _____________________________ Date _________

## 2010-07-09 NOTE — Letter (Signed)
Summary: Diabetic Instructions  [facility]  [address] [place], [place] [address]   Phone: [phone]  Fax: [phone]    WILKIN LIPPY 06-05-1932 MRN: [id number]   X   ORAL DIABETIC MEDICATION INSTRUCTIONS  The day before your procedure:   Take your diabetic pill as you do normally  The day of your procedure:   Do not take your diabetic pill    We will check your blood sugar levels during the admission process and again in Recovery before discharging you home

## 2010-07-09 NOTE — Procedures (Signed)
Summary: Colonoscopy   Colonoscopy  Procedure date:  01/11/2005  Findings:      Location:  [facility].   Patient Name: [name], [name] MRN:  Procedure Procedures: Colonoscopy CPT: 09811.  Personnel: Endoscopist: Vania Rea. Jarold Motto, MD.  Exam Location: Exam performed in Outpatient Clinic. Outpatient  Patient Consent: Procedure, Alternatives, Risks and Benefits discussed, consent obtained, from patient. Consent was obtained by the RN.  Indications  Surveillance of: Adenomatous Polyp(s).  History  Current Medications: Patient is not currently taking Coumadin.  Pre-Exam Physical: Cardio-pulmonary exam, Rectal exam, Abdominal exam, Extremity exam, Mental status exam WNL.  Exam Exam: Extent of exam reached: Cecum, extent intended: Cecum.  The cecum was identified by appendiceal orifice and IC valve. Patient position: on left side. Duration of exam: 20 minutes. Colon retroflexion performed. Images taken. ASA Classification: II. Tolerance: excellent.  Monitoring: Pulse and BP monitoring, Oximetry used. Supplemental O2 given. at 2 Liters.  Colon Prep Used Golytely for colon prep. Prep results: excellent.  Sedation Meds: Patient assessed and found to be appropriate for moderate (conscious) sedation. Fentanyl 50 mcg. given IV. Versed 6 mg. given IV.  Instrument(s): CF 140L. Serial [id number].  Findings - NORMAL EXAM: Cecum to Rectum. Not Seen: Polyps. AVM's. Colitis. Tumors. Melanosis. Crohn's. Diverticulosis. Hemorrhoids.   Assessment Normal examination.  Events  Unplanned Interventions: No intervention was required.  Plans Medication Plan: Referring Clarinda Obi to order medications.  Patient Education: Patient given standard instructions for: Patient instructed to get routine colonoscopy every 5 years.  Disposition: After procedure patient sent to recovery. After recovery patient sent home.  Scheduling/Referral: Follow-Up prn.    CC: Bruce H.  Swords, MD  This report was created from the original endoscopy report, which was reviewed and signed by the above listed endoscopist.

## 2010-08-17 LAB — GLUCOSE, CAPILLARY: Glucose-Capillary: 81 mg/dL (ref 70–99)

## 2010-09-21 ENCOUNTER — Other Ambulatory Visit: Payer: Self-pay | Admitting: Internal Medicine

## 2010-10-23 NOTE — Discharge Summary (Signed)
[place]. [facility]  Patient:    [name], [name]                            MRN: [id number] Adm. Date:  [id number] Disc. Date: [id number] Attending:  Judie Petit Dictator:   Cornell Barman, P.A. CC:         Valetta Mole. Swords, M.D. [facility]                           Discharge Summary  DISCHARGE DIAGNOSES: 1. Left facial numbness. 2. Premature ventricular contractions. 3. Hypertension.  BRIEF ADMISSION HISTORY:  [name] is a 77 year old white male who was seen in r. Swords office on the day of admission after complaints of sudden onset of left facial numbness associated with left lower extremity numbness.  Specifically, the lower part of the leg and his foot.  He denied weakness, visual changes, slurred speech, and ataxia.  The patient has also had mild occipital headache.  No prior history of stroke or TIA.  PAST MEDICAL HISTORY:  Colon polyps.  ADMISSION LABORATORY DATA:  CT of the head was normal.  HOSPITAL COURSE: #1 - NEUROLOGIC:  The patient presented with left facial numbness and left lower extremity numbness consistent with stroke.  Dr. Iran Planas was asked to see the patient. He agreed that this appeared to present as a stroke, probably the right middle cerebral artery distribution.  He recommended starting the patient on heparin and obtaining an MRI/MRA.  The patient was started on heparin and did have an MRI and MRA, which were completely normal.  Although the patients symptoms were consistent with stroke, there has been no evidence on radiological studies.  Dr. Iran Planas recommended aspirin for stroke prevention.  #2 - CARDIOVASCULAR:  The patient was having some PVCs and an abnormal EKG.  We did ask for cardiology to see the patient.  They recommended 2-D echocardiogram, as  well as an exercise treadmill test.  The patients echo showed mild LVH, normal V and RV function, normal right atrium and left atrium size.  There were  no valvular problems noted.  The final results of the exercise treadmill test are still pending at the time of this dictation.  Dr. Chales Abrahams did see the patient and recommended possibly adding Plavix to the patients regimen.  #3 - HYPERTENSION:  Dr. Chales Abrahams also commented on his hypertension and stated that this had not been a problem before and would not be too aggressive with acutely  reducing his blood pressure.  The patients blood pressure has improved without medications and at discharge his blood pressure is 135/75.  DISCHARGE LABORATORY DATA:  Lipid profile:  Cholesterol was 163, triglycerides ere 87, HDL was 38, LDL was 108.  TSH was 1.273.  Sed rate was 4.  Magnesium level f 2.  DISCHARGE MEDICATIONS:  Aspirin 325 daily.  FOLLOW-UP:  The patient is to follow up with Dr. Cato Mulligan in 10-14 days. DD:  08/14/99 TD:  08/15/99 Job: [id number]

## 2010-10-23 NOTE — Consult Note (Signed)
[place]. [facility]  Patient:    [name], [name]                            MRN: [id number] Proc. Date: 08/11/99 Adm. Date:  [id number] Attending:  Lorre Nick                          Consultation Report  CHIEF COMPLAINT:  Left facial numbness.  HISTORY OF PRESENT ILLNESS:  The patient is a 77 year old man who was referred rom Dr. Riley Kill office after evaluation for a history of sudden onset at 11:20 a.m. this morning of left facial numbness associated with numbness of his left lower  extremity, especially the lower part of the leg and his foot.  He denies weakness; no visual changes, no slurred speech, no ataxia.  Along with his symptoms, he has mild occipital headache.  No prior strokes or TIAs.  During the episode, the patient checked his own pulse and noted irregularity as well.  PAST MEDICAL HISTORY:  Colon polyps.  CURRENT MEDICATIONS:  None.  ALLERGIES:  No known drug allergies.  SOCIAL HISTORY:  He lives with his wife.  He denies smoking or drinking alcohol.  FAMILY HISTORY:  Significant in his father who died of coronary artery disease.  Mother had a stroke, and a brother has had a stroke as well.  REVIEW OF SYSTEMS:  Except for the ones mentioned in the HPI, unremarkable.  PHYSICAL EXAMINATION:  VITAL SIGNS:  Blood pressure initially 219/120.  It came down spontaneously to 177/96.  Pulse 86, respirations 16, temperature 98.9.  GENERAL:  Well-developed individual in no distress.  HEENT:  Head is normocephalic, atraumatic.  NECK:  Supple, no bruits.  LUNGS: Clear bilaterally.  HEART:  Heart sounds regular.  ABDOMEN:  Soft, bowel sounds present.  No visceromegaly.  EXTREMITIES:  No cyanosis or edema.  NEUROLOGIC:  Awake, alert, and oriented. Speech is fluent.  Memory and language are normal.  Pupils are equal and reactive bilaterally.  Extra-oculocephalic movement intact.  Face is slightly asymmetric showing a central  paresis, central type, on the left.  Motor Examination: Strength equal bilaterally at 5/5.  DTRs +2 throughout. Plantars downgoing.  Coordination is normal.  Sensory is slightly diminished to touch and pinprick on the left side of the face.  NEUROIMAGING:  I have personally reviewed CT scan of the patients brain which is completely normal.  IMPRESSION: 1. Stroke, right middle cerebral artery distribution. 2. Premature ventricular contractions.  PLAN AND RECOMMENDATIONS:  The diagnosis, condition, and further intervention were discussed at length with the patient and his wife at the bedside.  The patient ill need to be admitted for further workup and testing for cerebrovascular disease.  Management will consist of hemodynamic support, IV fluids, oxygen 2 liters, and  heparin ischemic stroke protocol.  Further testing would include noninvasive imaging of the intracranial and extracranial vessels with MRI/MRA of the brain. A transesophageal echocardiogram is also recommended for evaluation of potential embolic sources as the cause of the stroke.  The patient should be admitted to  telemetry unit for monitoring her cardiac status.  Will advise further upon completion of testing in regards to secondary long-term stroke prevention.  Thank you for allowing me to participate in the care of this patient. DD:  08/11/99 TD:  08/11/99 Job: [id number]

## 2010-10-27 ENCOUNTER — Other Ambulatory Visit (INDEPENDENT_AMBULATORY_CARE_PROVIDER_SITE_OTHER): Payer: Medicare PPO | Admitting: Internal Medicine

## 2010-10-27 DIAGNOSIS — T887XXA Unspecified adverse effect of drug or medicament, initial encounter: Secondary | ICD-10-CM

## 2010-10-27 DIAGNOSIS — IMO0001 Reserved for inherently not codable concepts without codable children: Secondary | ICD-10-CM

## 2010-10-27 DIAGNOSIS — E785 Hyperlipidemia, unspecified: Secondary | ICD-10-CM

## 2010-10-27 LAB — HEPATIC FUNCTION PANEL
ALT: 18 U/L (ref 0–53)
Albumin: 3.8 g/dL (ref 3.5–5.2)
Total Bilirubin: 0.5 mg/dL (ref 0.3–1.2)

## 2010-10-27 LAB — LIPID PANEL
HDL: 42.5 mg/dL (ref >39.00)
Total CHOL/HDL Ratio: 3
VLDL: 16.6 mg/dL (ref 0.0–40.0)

## 2010-10-27 LAB — BASIC METABOLIC PANEL
CO2: 33 mEq/L — ABNORMAL HIGH (ref 19–32)
GFR: 53.59 mL/min — ABNORMAL LOW (ref >60.00)
Glucose, Bld: 111 mg/dL — ABNORMAL HIGH (ref 70–99)
Potassium: 4.7 mEq/L (ref 3.5–5.1)
Sodium: 143 mEq/L (ref 135–145)

## 2010-11-03 ENCOUNTER — Encounter: Payer: Self-pay | Admitting: Internal Medicine

## 2010-11-03 ENCOUNTER — Ambulatory Visit (INDEPENDENT_AMBULATORY_CARE_PROVIDER_SITE_OTHER): Payer: Medicare PPO | Admitting: Internal Medicine

## 2010-11-03 DIAGNOSIS — I10 Essential (primary) hypertension: Secondary | ICD-10-CM

## 2010-11-03 DIAGNOSIS — K2990 Gastroduodenitis, unspecified, without bleeding: Secondary | ICD-10-CM

## 2010-11-03 DIAGNOSIS — E119 Type 2 diabetes mellitus without complications: Secondary | ICD-10-CM

## 2010-11-03 DIAGNOSIS — E785 Hyperlipidemia, unspecified: Secondary | ICD-10-CM

## 2010-11-03 DIAGNOSIS — K2970 Gastritis, unspecified, without bleeding: Secondary | ICD-10-CM

## 2010-11-03 MED ORDER — OMEPRAZOLE 20 MG PO CPDR: 20.0000 mg | DELAYED_RELEASE_CAPSULE | Freq: Every day | ORAL | Status: DC

## 2010-11-03 NOTE — Assessment & Plan Note (Signed)
Not as well controlled but i will not add meds at this time He is thin and follows a great diet

## 2010-11-03 NOTE — Assessment & Plan Note (Signed)
No relief with carafate---stop Trial PPI---side effects discussed

## 2010-11-03 NOTE — Assessment & Plan Note (Signed)
Controlled Continue current meds 

## 2010-11-03 NOTE — Assessment & Plan Note (Signed)
Well controlled. Continue current meds

## 2010-11-03 NOTE — Progress Notes (Signed)
  Subjective:    Patient ID: [name], male    DOB: [birth year], 77 y.o.   MRN: [id number]  HPI   patient comes in for followup of multiple medical problems including type 2 diabetes, hyperlipidemia, hypertension. The patient does not check blood sugar or blood pressure at home. The patetient does not follow an exercise or diet program. The patient denies any polyuria, polydipsia.  In the past the patient has gone to diabetic treatment center. The patient is tolerating medications  Without difficulty. The patient does admit to medication compliance.   Past Medical History  Diagnosis Date  . DM type 2 (diabetes mellitus, type 2)   . Hypertension   . Hyperlipidemia   . Colon polyps 1996    villous adenoma   Past Surgical History  Procedure Date  . Cataract     reports that he quit smoking about 52 years ago. He does not have any smokeless tobacco history on file. He reports that he does not drink alcohol. His drug history not on file. family history includes Coronary artery disease (age of onset:54) in his father and Stomach cancer (age of onset:97) in his mother. No Known Allergies   Review of Systems  patient denies chest pain, shortness of breath, orthopnea. Denies lower extremity edema, abdominal pain, change in appetite, change in bowel movements. Patient denies rashes, musculoskeletal complaints. No other specific complaints in a complete review of systems except has some GERD sxs for several months---happens every morning after breakfast. Tried carafate without success (dr Jarold Motto)     Objective:   Physical Exam  well-developed well-nourished male in no acute distress. HEENT exam atraumatic, normocephalic, neck supple without jugular venous distention. Chest clear to auscultation cardiac exam S1-S2 are regular. Abdominal exam overweight with bowel sounds, soft and nontender. Extremities no edema. Neurologic exam is alert with a normal gait.        Assessment & Plan:

## 2010-12-24 ENCOUNTER — Other Ambulatory Visit: Payer: Self-pay | Admitting: Internal Medicine

## 2011-01-04 ENCOUNTER — Other Ambulatory Visit: Payer: Self-pay | Admitting: Internal Medicine

## 2011-01-06 ENCOUNTER — Other Ambulatory Visit: Payer: Self-pay | Admitting: Internal Medicine

## 2011-01-15 ENCOUNTER — Other Ambulatory Visit: Payer: Self-pay | Admitting: Internal Medicine

## 2011-02-25 ENCOUNTER — Other Ambulatory Visit (INDEPENDENT_AMBULATORY_CARE_PROVIDER_SITE_OTHER): Payer: Medicare PPO

## 2011-02-25 DIAGNOSIS — E119 Type 2 diabetes mellitus without complications: Secondary | ICD-10-CM

## 2011-02-25 LAB — BASIC METABOLIC PANEL
Calcium: 9.3 mg/dL (ref 8.4–10.5)
Creatinine, Ser: 1.3 mg/dL (ref 0.4–1.5)
Sodium: 142 mEq/L (ref 135–145)

## 2011-02-25 LAB — HEMOGLOBIN A1C: Hgb A1c MFr Bld: 7.2 % — ABNORMAL HIGH (ref 4.6–6.5)

## 2011-02-25 LAB — HEPATIC FUNCTION PANEL
ALT: 18 U/L (ref 0–53)
Alkaline Phosphatase: 74 U/L (ref 39–117)
Bilirubin, Direct: 0 mg/dL (ref 0.0–0.3)
Total Protein: 6.9 g/dL (ref 6.0–8.3)

## 2011-02-25 LAB — LIPID PANEL: Cholesterol: 139 mg/dL (ref 0–200)

## 2011-03-05 ENCOUNTER — Ambulatory Visit (INDEPENDENT_AMBULATORY_CARE_PROVIDER_SITE_OTHER): Payer: Medicare PPO | Admitting: Internal Medicine

## 2011-03-05 ENCOUNTER — Encounter: Payer: Self-pay | Admitting: Internal Medicine

## 2011-03-05 VITALS — BP 110/74 | Temp 97.9°F | Ht 64.0 in | Wt 123.0 lb

## 2011-03-05 DIAGNOSIS — Z0000 Encounter for general adult medical examination without abnormal findings: Secondary | ICD-10-CM

## 2011-03-05 DIAGNOSIS — K2970 Gastritis, unspecified, without bleeding: Secondary | ICD-10-CM

## 2011-03-05 DIAGNOSIS — I10 Essential (primary) hypertension: Secondary | ICD-10-CM

## 2011-03-05 DIAGNOSIS — E119 Type 2 diabetes mellitus without complications: Secondary | ICD-10-CM

## 2011-03-05 DIAGNOSIS — Z23 Encounter for immunization: Secondary | ICD-10-CM

## 2011-03-05 DIAGNOSIS — R079 Chest pain, unspecified: Secondary | ICD-10-CM

## 2011-03-05 MED ORDER — PANTOPRAZOLE SODIUM 40 MG PO TBEC: 40.0000 mg | DELAYED_RELEASE_TABLET | Freq: Every day | ORAL | Status: DC

## 2011-03-05 NOTE — Assessment & Plan Note (Signed)
Adequate control. Continue current medications. 

## 2011-03-05 NOTE — Assessment & Plan Note (Addendum)
It's unclear to me whether the patient's symptoms are related to gastritis or gastroesophageal reflux disease. I think it is worth changing PPIs. We'll start Protonix 40 mg by mouth daily. Side effects discussed. Will check an EKG given the patient's chest discomfort.  Abnormal EKG---needs stress test

## 2011-03-05 NOTE — Assessment & Plan Note (Signed)
Adequate control. Continue current medications. I doubt that the metformin is contributing to his chest discomfort but it could be that the metformin is causing GI distress which is perceived as chest discomfort.

## 2011-03-05 NOTE — Progress Notes (Signed)
  Subjective:    Patient ID: [name], male    DOB: 1931/09/15, 77 y.o.   MRN: [id number]  HPI  patient comes in for followup of multiple medical problems including type 2 diabetes, hyperlipidemia, hypertension. The patient does not check blood sugar or blood pressure at home. The patetient does not follow an exercise or diet program. The patient denies any polyuria, polydipsia.  In the past the patient has gone to diabetic treatment center. The patient is tolerating medications  Without difficulty. The patient does admit to medication compliance.   Reports worseining reflux---described as chest pain typically in the morning while he was driving to work. He denies any exertional chest discomfort. I have reviewed recent endoscopy.  Past Medical History  Diagnosis Date  . DM type 2 (diabetes mellitus, type 2)   . Hypertension   . Hyperlipidemia   . Colon polyps 1996    villous adenoma   Past Surgical History  Procedure Date  . Cataract     reports that he quit smoking about 52 years ago. He has never used smokeless tobacco. He reports that he does not drink alcohol or use illicit drugs. family history includes Coronary artery disease (age of onset:54) in his father and Stomach cancer (age of onset:97) in his mother. No Known Allergies    Review of Systems  patient denies chest pain, shortness of breath, orthopnea. Denies lower extremity edema, abdominal pain, change in appetite, change in bowel movements. Patient denies rashes, musculoskeletal complaints. No other specific complaints in a complete review of systems.      Objective:   Physical Exam  well-developed well-nourished male in no acute distress. HEENT exam atraumatic, normocephalic, neck supple without jugular venous distention. Chest clear to auscultation cardiac exam S1-S2 are regular. Abdominal exam overweight with bowel sounds, soft and nontender. Extremities no edema. Neurologic exam is alert with a normal  gait.        Assessment & Plan:

## 2011-03-09 ENCOUNTER — Other Ambulatory Visit: Payer: Self-pay | Admitting: Internal Medicine

## 2011-03-09 DIAGNOSIS — R079 Chest pain, unspecified: Secondary | ICD-10-CM

## 2011-03-23 ENCOUNTER — Encounter: Payer: Self-pay | Admitting: *Deleted

## 2011-03-23 ENCOUNTER — Ambulatory Visit (HOSPITAL_COMMUNITY): Payer: Medicare PPO | Attending: Internal Medicine | Admitting: Radiology

## 2011-03-23 ENCOUNTER — Encounter: Payer: Self-pay | Admitting: Internal Medicine

## 2011-03-23 ENCOUNTER — Ambulatory Visit (INDEPENDENT_AMBULATORY_CARE_PROVIDER_SITE_OTHER): Payer: Medicare PPO | Admitting: Internal Medicine

## 2011-03-23 VITALS — Ht 64.0 in | Wt 122.0 lb

## 2011-03-23 DIAGNOSIS — R9439 Abnormal result of other cardiovascular function study: Secondary | ICD-10-CM

## 2011-03-23 DIAGNOSIS — R079 Chest pain, unspecified: Secondary | ICD-10-CM | POA: Insufficient documentation

## 2011-03-23 DIAGNOSIS — R931 Abnormal findings on diagnostic imaging of heart and coronary circulation: Secondary | ICD-10-CM

## 2011-03-23 DIAGNOSIS — R9431 Abnormal electrocardiogram [ECG] [EKG]: Secondary | ICD-10-CM

## 2011-03-23 MED ORDER — METOPROLOL TARTRATE 25 MG PO TABS: 25.0000 mg | ORAL_TABLET | Freq: Two times a day (BID) | ORAL | Status: DC

## 2011-03-23 MED ORDER — TECHNETIUM TC 99M TETROFOSMIN IV KIT
10.8000 | PACK | Freq: Once | INTRAVENOUS | Status: AC | PRN
  Administered 2011-03-23: 11 via INTRAVENOUS

## 2011-03-23 MED ORDER — TECHNETIUM TC 99M TETROFOSMIN IV KIT
33.0000 | PACK | Freq: Once | INTRAVENOUS | Status: AC | PRN
  Administered 2011-03-23: 33 via INTRAVENOUS

## 2011-03-23 MED ORDER — REGADENOSON 0.4 MG/5ML IV SOLN
0.4000 mg | Freq: Once | INTRAVENOUS | Status: AC
  Administered 2011-03-23: 0.4 mg via INTRAVENOUS

## 2011-03-23 NOTE — Progress Notes (Signed)
HPI: [name] is a 77 y.o. male Is seen following an abnormal Myoview scan today  He was scanned because of changes in electrocardiogram in the setting of worsening symptoms of "reflux" that was precipitated both by meals and by lifting and carrying. Notably was not present with walking. There has been no significant progression in his symptoms over recent weeks.  Cardiac risk factors are noted for hypertension and diabetes;  lipids are appreciated.  there is a family Current Outpatient Prescriptions  Medication Sig Dispense Refill  . aspirin 325 MG EC tablet Take 325 mg by mouth daily.        . felodipine (PLENDIL) 5 MG 24 hr tablet TAKE 1 TABLET EVERY DAY  100 tablet  3  . fish oil-omega-3 fatty acids 1000 MG capsule Take 2 g by mouth daily.        [name] losartan-hydrochlorothiazide (HYZAAR) 100-25 MG per tablet TAKE 1 TABLET BY MOUTH EVERY DAY  90 tablet  2  . metFORMIN (GLUCOPHAGE) 1000 MG tablet TAKE 1 TABLET TWICE DAILY  180 tablet  2  . Multiple Vitamins-Minerals (ICAPS PO) Take by mouth.        . pantoprazole (PROTONIX) 40 MG tablet Take 1 tablet (40 mg total) by mouth daily.  30 tablet  1  . Probiotic Product (ALIGN PO) Take 1 capsule by mouth daily.        . simvastatin (ZOCOR) 40 MG tablet ONE BY MOUTH DAILY  90 tablet  2   No current facility-administered medications for this visit.   Facility-Administered Medications Ordered in Other Visits  Medication Dose Route Frequency Provider Last Rate Last Dose  . regadenoson (LEXISCAN) injection SOLN 0.4 mg  0.4 mg Intravenous Once Judie Petit, MD   0.4 mg at 03/23/11 1415  . technetium tetrofosmin (TC-MYOVIEW) injection 11 milli Curie  11 milli Curie Intravenous Once PRN Dolores Patty, MD   11 milli Curie at 03/23/11 1220  . technetium tetrofosmin (TC-MYOVIEW) injection 33 milli Curie  33 milli Curie Intravenous Once PRN Dolores Patty, MD   33 milli Curie at 03/23/11 1415    No Known Allergies  Past Medical History    Diagnosis Date  . DM type 2 (diabetes mellitus, type 2)   . Hypertension   . Hyperlipidemia   . Colon polyps 1996    villous adenoma    Past Surgical History  Procedure Date  . Cataract     Family History  Problem Relation Age of Onset  . Coronary artery disease Father 38    deceased  . Stomach cancer Mother 69    History   Social History  . Marital Status: Married    Spouse Name: N/A    Number of Children: N/A  . Years of Education: N/A   Occupational History  . Not on file.   Social History Main Topics  . Smoking status: Former Smoker -- 1.0 packs/day    Quit date: 06/07/1958  . Smokeless tobacco: Never Used  . Alcohol Use: No  . Drug Use: No  . Sexually Active: Not on file   Other Topics Concern  . Not on file   Social History Narrative  . No narrative on file    Fourteen point review of systems was negative except as noted in HPI and PMH   PHYSICAL EXAMINATION  Blood pressure 180/76, pulse 59, height 5\' 4"  (1.626 m), weight 123 lb (55.792 kg).   Well developed and nourished older Caucasian male appearing  his stated agein no acute distress HENT normal Neck supple with JVP-flat Carotids brisk and full without bruits Back without scoliosis or kyphosis Clear Regular rate and rhythm,there no murmurs. Positive S4 Abd-soft with active BS without hepatomegaly or midline pulsation Femoral pulses 2+ distal pulses intact No Clubbing cyanosis edema Skin-warm and dry LN-neg submandibular and supraclavicular A & Oriented CN 3-12 normal  Grossly normal sensory and motor function Affect engaging . Electro cardiogram dated today and this is sinus rhythm at 57 with intervals of 0.18/0.09/0.41 her T-wave inversions in the anterior precordiummedical less prominent than was evident on September 2012 ECG  No prior ECGs are available

## 2011-03-23 NOTE — Progress Notes (Signed)
[facility] SITE 3 NUCLEAR MED [address] [place] [address] [phone]  Cardiology Nuclear Med Study  [name] is a 77 y.o. male [id number] Dec 21, 1931   Nuclear Med Background Indication for Stress Test:  Evaluation for Ischemia and Abnormal EKG History:  '01 WNU:UVOZDG per patient; '01 Echo:Normal LVF Cardiac Risk Factors: CVA, Family History - CAD, History of Smoking, Hypertension, Lipids and NIDDM  Symptoms:  Chest Pain/"Aching/Acid Reflux" with and without Exertion, with (B) arms aching (last episode of chest discomfort was last night) and Rapid HR   Nuclear Pre-Procedure Caffeine/Decaff Intake:  None NPO After: 6:30am   Lungs:  Clear.  O2 SAT 97% on RA IV 0.9% NS with Angio Cath:  20g  IV Site: R Wrist  IV Started by:  Cathlyn Parsons, RN  Chest Size (in):  38 Cup Size: n/a  Height: 5\' 4"  (1.626 m)  Weight:  122 lb (55.339 kg)  BMI:  Body mass index is 20.94 kg/(m^2). Tech Comments:  n/a    Nuclear Med Study 1 or 2 day study: 1 day  Stress Test Type:  Lexiscan  Reading MD: Arvilla Meres, MD  Order Authorizing Provider:  Leeanne Deed  Resting Radionuclide: Technetium 53m Tetrofosmin  Resting Radionuclide Dose: 10.8 mCi   Stress Radionuclide:  Technetium 68m Tetrofosmin  Stress Radionuclide Dose: 33 mCi           Stress Protocol Rest HR: 57 Stress HR: 86  Rest BP: 145/82 Stress BP: 146/70  Exercise Time (min): n/a METS: n/a   Predicted Max HR: 143 bpm % Max HR: 60.14 bpm Rate Pressure Product: 64403   Dose of Adenosine (mg):  n/a Dose of Lexiscan: 0.4 mg  Dose of Atropine (mg): n/a Dose of Dobutamine: n/a mcg/kg/min (at max HR)  Stress Test Technologist: Smiley Houseman, CMA-N  Nuclear Technologist:  Domenic Polite, CNMT     Rest Procedure:  Myocardial perfusion imaging was performed at rest 45 minutes following the intravenous administration of Technetium 56m Tetrofosmin.  Rest ECG: ST elevation and rare PAC.  (Discussed  baseline EKG with Dr. Graciela Husbands, DOD, and was advised to change from Bruce protocol to [facility])  Stress Procedure:  The patient was initially schedule to walk the treadmill utilizing the Bruce protocol, but due to his abnormal EKG, Dr. Graciela Husbands recommended changing his procedure to [facility].  The patient received IV Lexiscan 0.4 mg over 15-seconds.  Technetium 60m Tetrofosmin injected at 30-seconds.  There were no significant changes with Lexiscan.  Quantitative spect images were obtained after a 45 minute delay.   Discussed images with Dr. Graciela Husbands (DOD) and he is seeing patient today.  Stress ECG: No significant change from baseline ECG  QPS Raw Data Images:  Normal; no motion artifact; normal heart/lung ratio. Stress Images:  Markedly decreased uptake in the mid to distal andterior wall and apex. Rest Images:  Decreased uptake in the distal anterior wall and anteroapical segment. 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  Transient Ischemic Dilatation (Normal <1.22):  1.26 Lung/Heart Ratio (Normal <0.45):  .32  Quantitative Gated Spect Images QGS EDV:  101 ml QGS ESV:  47 ml QGS cine images:  Hypokinesis of the mid to distal anterior wall with dyskinesis of apex.  QGS EF: 54%  Impression Exercise Capacity:  Lexiscan with no exercise. BP Response:  n/a Clinical Symptoms:  n/a ECG Impression:  Resting ECG with TWI  in anterior and lateral leads. No significant change with Lexiscan.  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 Patient seen by Dr. Graciela Husbands after study and set up for cardiac catheterization.     [name]

## 2011-03-23 NOTE — Assessment & Plan Note (Signed)
Markedly abnormal scan as noted above.

## 2011-03-23 NOTE — Assessment & Plan Note (Addendum)
Patient is having exertional chest discomfort which is manifesting as a reflux-like syndrome with radiation to the neck and arms. It is precipitated by lifting. His Myoview scan today is markedly abnormal suggesting LAD ischemia in the setting of her prior LAD distal infarction. We'll begin him on Plavix today with a 600 mg load per Dr. Excell Seltzer. We'll start him on 75 mg daily. He has a modest degree of bradycardia so we will begin a low-dose beta blocker to 25 mg twice daily.  His creatinine clearance is mildly depressed at 57 cc. Will hold his losartan HCT for 48 hours in anticipation of the procedure.  R/B/A to procedure reviewed incl but not limited to death bleeding need for emrrgenet surgery  And stroke he underswtandas agrees and is willing to proceed  Images reviewed in detail incl prior MI

## 2011-03-23 NOTE — Patient Instructions (Addendum)
Your physician has requested that you have a cardiac catheterization. Cardiac catheterization is used to diagnose and/or treat various heart conditions. Doctors may recommend this procedure for a number of different reasons. The most common reason is to evaluate chest pain. Chest pain can be a symptom of coronary artery disease (CAD), and cardiac catheterization can show whether plaque is narrowing or blocking your heart's arteries. This procedure is also used to evaluate the valves, as well as measure the blood flow and oxygen levels in different parts of your heart. For further information please visit [email or web address] Please follow instruction sheet, as given.  Your physician has recommended you make the following change in your medication:  1) Start metoprolol tartrate (lopressor) 25mg  one tablet by mouth twice daily.

## 2011-03-25 ENCOUNTER — Ambulatory Visit (HOSPITAL_COMMUNITY)
Admission: RE | Admit: 2011-03-25 | Discharge: 2011-03-26 | Disposition: A | Discharge status: Home / Self Care | Payer: Medicare PPO | Source: Ambulatory Visit | Attending: Cardiovascular Disease | Admitting: Cardiovascular Disease

## 2011-03-25 DIAGNOSIS — I2510 Atherosclerotic heart disease of native coronary artery without angina pectoris: Secondary | ICD-10-CM | POA: Insufficient documentation

## 2011-03-25 DIAGNOSIS — I209 Angina pectoris, unspecified: Secondary | ICD-10-CM | POA: Insufficient documentation

## 2011-03-25 DIAGNOSIS — E119 Type 2 diabetes mellitus without complications: Secondary | ICD-10-CM | POA: Insufficient documentation

## 2011-03-25 DIAGNOSIS — I10 Essential (primary) hypertension: Secondary | ICD-10-CM | POA: Insufficient documentation

## 2011-03-25 DIAGNOSIS — E785 Hyperlipidemia, unspecified: Secondary | ICD-10-CM | POA: Insufficient documentation

## 2011-03-25 HISTORY — PX: CORONARY STENT PLACEMENT: SHX1402

## 2011-03-25 HISTORY — PX: CARDIAC CATHETERIZATION: SHX172

## 2011-03-25 LAB — BASIC METABOLIC PANEL
BUN: 33 mg/dL — ABNORMAL HIGH (ref 6–23)
CO2: 30 mEq/L (ref 19–32)
Chloride: 103 mEq/L (ref 96–112)
Creatinine, Ser: 1.27 mg/dL (ref 0.50–1.35)
Glucose, Bld: 118 mg/dL — ABNORMAL HIGH (ref 70–99)

## 2011-03-25 LAB — CBC
HCT: 34.3 % — ABNORMAL LOW (ref 39.0–52.0)
MCHC: 33.5 g/dL (ref 30.0–36.0)
MCV: 88.2 fL (ref 78.0–100.0)
RDW: 13.4 % (ref 11.5–15.5)

## 2011-03-25 LAB — PROTIME-INR: INR: 0.97 (ref 0.00–1.49)

## 2011-03-25 LAB — GLUCOSE, CAPILLARY
Glucose-Capillary: 107 mg/dL — ABNORMAL HIGH (ref 70–99)
Glucose-Capillary: 154 mg/dL — ABNORMAL HIGH (ref 70–99)

## 2011-03-25 LAB — POCT ACTIVATED CLOTTING TIME: Activated Clotting Time: 369 seconds

## 2011-03-26 LAB — CBC
MCH: 29.5 pg (ref 26.0–34.0)
MCV: 88.6 fL (ref 78.0–100.0)
Platelets: 231 10*3/uL (ref 150–400)
RDW: 13.3 % (ref 11.5–15.5)

## 2011-03-26 LAB — BASIC METABOLIC PANEL
BUN: 20 mg/dL (ref 6–23)
Calcium: 9 mg/dL (ref 8.4–10.5)
Creatinine, Ser: 1.17 mg/dL (ref 0.50–1.35)
GFR calc Af Amer: 68 mL/min — ABNORMAL LOW (ref >90)

## 2011-03-26 LAB — GLUCOSE, CAPILLARY: Glucose-Capillary: 131 mg/dL — ABNORMAL HIGH (ref 70–99)

## 2011-03-28 NOTE — Discharge Summary (Addendum)
  NAMETIMOTHEUS, SALM NO.:  [id number]  MEDICAL RECORD NO.:  [id number]  LOCATION:  2508                         FACILITY:  [facility]  PHYSICIAN:  Veverly Fells. Excell Seltzer, MD  DATE OF BIRTH:  [birth year]  DATE OF ADMISSION:  03/25/2011 DATE OF DISCHARGE:  03/26/2011                              DISCHARGE SUMMARY   ADDENDUM  His updated medication list at discharge include: 1. Plavix 75 mg daily. 2. Aspirin 81 mg daily. 3. Align 4 mg daily. 4. Artificial Tears 1 drop both eyes daily. 5. Felodipine 5 mg daily. 6. Fish oil 1000 mg b.i.d. 7. ICaps 1 tablet daily. 8. Losartan/hydrochlorothiazide 100/25 mg daily. 9. Metformin 1000 mg b.i.d. with instructions not to start until     March 28, 2011. 10.Metoprolol tartrate 25 mg b.i.d. 11.Multivitamin 1 tablet daily. 12.Simvastatin 40 mg nightly. 13.Tylenol Extra Strength 500 mg 2 tablets q.6 hours p.r.n. pain.     Ronie Spies, P.A.C.   ______________________________ Veverly Fells. Excell Seltzer, MD    DD/MEDQ  D:  03/26/2011  T:  03/26/2011  Job:  [id number]  Electronically Signed by Tonny Bollman MD on 03/28/2011 01:18:43 AM Electronically Signed by Ronie Spies  on 04/01/2011 10:28:00 AM

## 2011-03-28 NOTE — Cardiovascular Report (Signed)
NAMERIDGE, LAFOND NO.:  [id number]  MEDICAL RECORD NO.:  [id number]  LOCATION:  2508                         FACILITY:  [facility]  PHYSICIAN:  Veverly Fells. Excell Seltzer, MD  DATE OF BIRTH:  1931/09/18  DATE OF PROCEDURE:  03/25/2011 DATE OF DISCHARGE:                           CARDIAC CATHETERIZATION   PROCEDURE: 1. Left heart catheterization. 2. Selective coronary angiography. 3. PTCA and stenting of the LAD. 4. Perclose of the right femoral artery.  PROCEDURAL INDICATION:  Mr. Zenon is a very nice 77 year old gentleman who presented with class III angina.  He had a stress Myoview that demonstrated a very large area of anteroseptal ischemia.  He was referred for cardiac cath.  The patient was preloaded with Plavix as there was a high suspicion that he would require percutaneous intervention.  I performed the procedure along with Dr. Corliss Marcus who was here to do some cases with a Proctor.  The right groin was prepped, draped, and anesthetized with 1% lidocaine. A 5-French sheath was placed in the right femoral artery.  Standard Judkins catheters were used for coronary angiography.  A pigtail catheter was used to record left ventricular pressure and perform a pullback across the aortic valve.  Following the diagnostic procedure, I elected to proceed with percutaneous intervention.  PROCEDURAL FINDINGS:  Aortic pressure 130/49 with a mean of 80, left ventricular pressure 130/10.  Left mainstem.  The left main is calcified.  There is no significant obstructive disease.  It divides into the LAD and left circumflex.  LAD.  The LAD is diffusely diseased.  The vessel has a critically proximal lesion with 99% stenosis and TIMI 2 flow beyond the lesion. The diagonal branch arising from that lesion fills late.  The entire distal vessel appears under filled.  The ostium and proximal portion of the LAD before the lesion also appears to have mild-to-moderate diffuse  disease.  Left circumflex.  The circumflex is a large, dominant vessel.  There is calcification of the circumflex origin with no more than 20-30% stenosis.  The first OM branch has 40% proximal stenosis.  The second OM has no significant disease.  The left posterolateral branch has no significant disease.  The distal AV groove circumflex has minimal 20-30% stenosis present.  The right coronary artery is small and nondominant.  There is no significant obstructive disease.  It provides a single branch to the RV margin.  PCI NOTE:  The patient had been adequately preloaded with Plavix. Angiomax was started for anticoagulation.  The sheath was upsized to a 6- [place].  An XB LAD 3.5 cm guide catheter was inserted.  A Luge wire was advanced easily across the lesion and the vessel was predilated with a 2.0 x 12 mm balloon to 8 atmospheres.  There was better flow in the diagonal branch after the lesion had been predilated.  The lesion was then stented with a 2.75 x 20 mm Promus Element drug-eluting stent.  The stent was deployed at 13 atmospheres.  There was mild under expansion at the lesion site and the stented segment was postdilated with a 3.0 x 15 mm noncompliant balloon, which was taken to 15  atmospheres on a single inflation.  There was an excellent angiographic result with no residual stenosis at the lesion site.  The diagonal remained patent with ostial narrowing but there was TIMI-3 flow and there was much more flow in the diagonal and it was proceeding the intervention.  There was residual stenosis in the proximal vessel, but it did not appear flow limiting.  FINAL CONCLUSION: 1. Severe single-vessel coronary artery disease involving the proximal     LAD. 2. Successful percutaneous intervention of the proximal LAD using a     drug-eluting stent platform.  RECOMMENDATIONS:  The patient should continue with dual anti-platelet therapy for a minimum of 12 months.     Veverly Fells. Excell Seltzer, MD     MDC/MEDQ  D:  03/25/2011  T:  03/25/2011  Job:  [id number]  cc:   Duke Salvia, MD, [facility] Bruce H. Swords, MD  Electronically Signed by Tonny Bollman MD on 03/28/2011 01:18:15 AM

## 2011-03-28 NOTE — Cardiovascular Report (Signed)
[name], [name] NO.:  [id number]  MEDICAL RECORD NO.:  [id number]  LOCATION:  2508                         FACILITY:  [facility]  PHYSICIAN:  Veverly Fells. Excell Seltzer, MD  DATE OF BIRTH:  25-Jan-1932  DATE OF PROCEDURE: DATE OF DISCHARGE:  03/26/2011                           CARDIAC CATHETERIZATION   FINAL DIAGNOSIS:  Coronary artery disease with class 3 angina.  SECONDARY DIAGNOSES: 1. Essential hypertension. 2. Hyperlipidemia. 3. Type 2 diabetes.  PROCEDURES PERFORMED:  March 25, 2011:  The patient underwent diagnostic catheterization and percutaneous coronary intervention of the left anterior descending.  PROBLEM BASED HOSPITAL COURSE: 1. Coronary artery disease with class 3 angina.  This is a 77 year old     gentleman who presented for elective cardiac catheterization after     being evaluated for typical angina.  The patient had an outpatient     Myoview stress test that showed marked anteroseptal ischemia.  He     was loaded with Plavix as an outpatient and brought in for     diagnostic catheterization.  This demonstrated critical stenosis of     the proximal LAD.  The patient had a left dominant circumflex that     was widely patent.  The right coronary artery was small and     nondominant.  The patient underwent successful percutaneous     intervention using a 2.75 x 20 mm Promus Element drug-eluting     stent.  There was mild residual stenosis in the ostial and proximal     LAD before the stented segment, but there was an excellent stent     result with 0% residual stenosis at the stent site.  The patient     had a mechanical closure of his femoral arteriotomy with a Perclose     device.  The morning following the procedure, he was ambulatory     without symptoms, his right groin site was stable, and he was     deemed suitable for discharge.  He will remain on dual anti-     platelet therapy with aspirin and Plavix for at least 12 months.  I  have recommended that he have a stress Myoview scan in 1 year     because of his proximal LAD disease and a large amount of territory     at risk. 2. Dyslipidemia and hypertension.  Both of these problems are stable.     The patient will stay on his home medical therapy which includes a     statin drug for his dyslipidemia (simvastatin 40 mg daily) and he     is on felodipine, losartan, and hydrochlorothiazide for his     hypertension.  Most of his blood pressures were in the range of 120-     130 systolic.  He did have one systolic blood pressure reading up     to 172.  LABS:  On the day following the procedure, the patient's hemoglobin was 10.9, his platelet count was 231,000, white blood cell count was 11.1, potassium 4.0, creatinine 1.17.  FOLLOWUP:  Follow up will be scheduled in the Cardiology office.  His initial followup will probably be  with one of the PAs.  He will have ongoing followup with Dr. Graciela Husbands.  Again, I would recommend a Myoview stress scan in the in 1 year.  DISCHARGE MEDICATIONS:  Please see the home medication reconciliation list for a complete list.  DISCHARGE MEDICATIONS: 1. Metoprolol tartrate 25 mg b.i.d. 2. Simvastatin 40 mg daily. 3. Pantoprazole 40 mg daily. 4. Multivitamin 1 daily. 5. Metformin 1000 mg twice daily, to be resumed on March 27, 2011. 6. Losartan/hydrochlorothiazide 100/25 mg daily. 7. Fish oil 1000 mg twice daily. 8. Felodipine 5 mg daily. 9. Aspirin 81 mg daily. 10.Plavix 75 mg daily.     Veverly Fells. Excell Seltzer, MD     MDC/MEDQ  D:  03/26/2011  T:  03/26/2011  Job:  [id number]  cc:   Duke Salvia, MD, [facility] Bruce H. Swords, MD  Electronically Signed by Tonny Bollman MD on 03/28/2011 01:18:37 AM

## 2011-04-01 ENCOUNTER — Encounter: Payer: Self-pay | Admitting: *Deleted

## 2011-04-09 ENCOUNTER — Ambulatory Visit (INDEPENDENT_AMBULATORY_CARE_PROVIDER_SITE_OTHER): Payer: Medicare PPO | Admitting: Physician Assistant

## 2011-04-09 ENCOUNTER — Encounter: Payer: Self-pay | Admitting: Physician Assistant

## 2011-04-09 DIAGNOSIS — I10 Essential (primary) hypertension: Secondary | ICD-10-CM

## 2011-04-09 DIAGNOSIS — M79606 Pain in leg, unspecified: Secondary | ICD-10-CM

## 2011-04-09 DIAGNOSIS — M79609 Pain in unspecified limb: Secondary | ICD-10-CM

## 2011-04-09 DIAGNOSIS — M79605 Pain in left leg: Secondary | ICD-10-CM | POA: Insufficient documentation

## 2011-04-09 DIAGNOSIS — M79604 Pain in right leg: Secondary | ICD-10-CM

## 2011-04-09 DIAGNOSIS — E785 Hyperlipidemia, unspecified: Secondary | ICD-10-CM

## 2011-04-09 DIAGNOSIS — I2510 Atherosclerotic heart disease of native coronary artery without angina pectoris: Secondary | ICD-10-CM

## 2011-04-09 NOTE — Assessment & Plan Note (Signed)
With CAD, he may have PAD.  Obtain ABIs.

## 2011-04-09 NOTE — Patient Instructions (Signed)
Your physician has requested that you have an ankle brachial index (ABI) DX 729.5 LEG PAIN . During this test an ultrasound and blood pressure cuff are used to evaluate the arteries that supply the arms and legs with blood. Allow thirty minutes for this exam. There are no restrictions or special instructions.  Your physician recommends that you schedule a follow-up appointment in: 3 MONTHS WITH DR. Graciela Husbands PER [address] PA-C

## 2011-04-09 NOTE — Progress Notes (Signed)
History of Present Illness: Primary Electrophysiologist:  Dr. Sherryl Manges  [name] is a 77 y.o. male who presents for post hospital follow up.   He was recently set up for a Myoview due to exertional angina.  Myoview demonstrated anteroapical ischemia. LHC 03/25/11 by Dr. Excell Seltzer:  pLAD 99%, oCFX 20-30%, pOM1 40%, dAVCFX 20-30%.  EF was normal on nuclear study.  He was treated with a Promus DES to his pLAD.  There was mild residual stenosis in the ostial and prox LAD before the stent but excellent stent placement was noted.  He will need Plavix for a minimum of 12 months and a follow up stress myoview in 1 year b/c of prox LAD disease and a large amount of territory at risk.  Pertinent labs: Hgb 10.9, K 4, creatinine 1.17.   The patient denies chest pain, shortness of breath, syncope, orthopnea, PND or significant pedal edema.  He feels good since his PCI and continues to be active on his stationary bike at home.  He does complain of bilateral calf discomfort with going up hills that predates his PCI.    Past Medical History  Diagnosis Date  . DM type 2 (diabetes mellitus, type 2)   . Hypertension   . Hyperlipidemia   . Colon polyps 1996    villous adenoma  . CAD (coronary artery disease)     LHC 03/25/11 by Dr. Excell Seltzer:  pLAD 99%, oCFX 20-30%, pOM1 40%, dAVCFX 20-30%.  EF was normal on nuclear study.  He was treated with a Promus DES to his pLAD.     Current Outpatient Prescriptions  Medication Sig Dispense Refill  . aspirin 325 MG EC tablet Take 81 mg by mouth daily.       . clopidogrel (PLAVIX) 75 MG tablet Take 75 mg by mouth daily.        . felodipine (PLENDIL) 5 MG 24 hr tablet TAKE 1 TABLET EVERY DAY  100 tablet  3  . fish oil-omega-3 fatty acids 1000 MG capsule Take 2 g by mouth daily.        . Hypromellose (ARTIFICIAL TEARS OP) Apply to eye.        . losartan-hydrochlorothiazide (HYZAAR) 100-25 MG per tablet TAKE 1 TABLET BY MOUTH EVERY DAY  90 tablet  2  . metFORMIN  (GLUCOPHAGE) 1000 MG tablet TAKE 1 TABLET TWICE DAILY  180 tablet  2  . metoprolol tartrate (LOPRESSOR) 25 MG tablet Take 1 tablet (25 mg total) by mouth 2 (two) times daily.  60 tablet  6  . Multiple Vitamins-Minerals (ICAPS PO) Take by mouth.        . Probiotic Product (ALIGN PO) Take 1 capsule by mouth daily.        . simvastatin (ZOCOR) 40 MG tablet ONE BY MOUTH DAILY  90 tablet  2    Allergies: No Known Allergies   History  Substance Use Topics  . Smoking status: Former Smoker -- 1.0 packs/day    Quit date: 06/07/1958  . Smokeless tobacco: Never Used  . Alcohol Use: No     Vital Signs: BP 112/52  Pulse 60  Ht 5\' 4"  (1.626 m)  Wt 130 lb 6.4 oz (59.149 kg)  BMI 22.38 kg/m2  PHYSICAL EXAM: Well nourished, well developed, in no acute distress HEENT: normal Neck: no JVD Cardiac:  normal S1, S2; RRR; no murmur Lungs:  clear to auscultation bilaterally, no wheezing, rhonchi or rales Abd: soft, nontender, no hepatomegaly Ext: no edema; RFA site  without hematoma or bruit Vascular: no FA bruits bilaterally; DP/PT 1+ bilat Skin: warm and dry Neuro:  CNs 2-12 intact, no focal abnormalities noted  EKG:  NSR, HR 60, inferior J point elevation, NSSTTW changes  ASSESSMENT AND PLAN:

## 2011-04-09 NOTE — Assessment & Plan Note (Signed)
Stable post PCI.  Continue ASA and Plavix.  Follow up in 3 months with Dr. Graciela Husbands or me.  Plan repeat myoview in one year and minimum of 12 months of Plavix.

## 2011-04-09 NOTE — Assessment & Plan Note (Signed)
Controlled.  Continue current therapy.  

## 2011-04-09 NOTE — Assessment & Plan Note (Signed)
Managed by PCP.  Goal LDL < 70. 

## 2011-05-06 ENCOUNTER — Encounter (INDEPENDENT_AMBULATORY_CARE_PROVIDER_SITE_OTHER): Payer: Medicare PPO | Admitting: Cardiology

## 2011-05-06 DIAGNOSIS — I2510 Atherosclerotic heart disease of native coronary artery without angina pectoris: Secondary | ICD-10-CM

## 2011-05-06 DIAGNOSIS — I739 Peripheral vascular disease, unspecified: Secondary | ICD-10-CM

## 2011-05-06 DIAGNOSIS — M79606 Pain in leg, unspecified: Secondary | ICD-10-CM

## 2011-05-06 DIAGNOSIS — E1159 Type 2 diabetes mellitus with other circulatory complications: Secondary | ICD-10-CM

## 2011-05-13 ENCOUNTER — Telehealth: Payer: Self-pay | Admitting: Internal Medicine

## 2011-05-13 NOTE — Telephone Encounter (Signed)
Fu call  °Pt returning your call about test results °

## 2011-05-14 NOTE — Telephone Encounter (Signed)
I spoke with the patient's wife.

## 2011-07-14 ENCOUNTER — Encounter: Payer: Self-pay | Admitting: Internal Medicine

## 2011-07-14 ENCOUNTER — Ambulatory Visit (INDEPENDENT_AMBULATORY_CARE_PROVIDER_SITE_OTHER): Payer: Medicare PPO | Admitting: Internal Medicine

## 2011-07-14 DIAGNOSIS — I2510 Atherosclerotic heart disease of native coronary artery without angina pectoris: Secondary | ICD-10-CM

## 2011-07-14 DIAGNOSIS — I10 Essential (primary) hypertension: Secondary | ICD-10-CM

## 2011-07-14 NOTE — Assessment & Plan Note (Signed)
Stable

## 2011-07-14 NOTE — Assessment & Plan Note (Signed)
Stable on Plavix and asa.

## 2011-07-14 NOTE — Progress Notes (Signed)
HPI: [name] is a 78 y.o. male Is seen following an abnormal Myoview in October for which he underwent cath  Found to have high grade single vessel disease, he got stented with DES     Cardiac risk factors are noted for hypertension and diabetes;  lipids are appreciated.  there is a family Current Outpatient Prescriptions  Medication Sig Dispense Refill  . aspirin 325 MG EC tablet Take 81 mg by mouth daily.       . clopidogrel (PLAVIX) 75 MG tablet Take 75 mg by mouth daily.        . felodipine (PLENDIL) 5 MG 24 hr tablet TAKE 1 TABLET EVERY DAY  100 tablet  3  . fish oil-omega-3 fatty acids 1000 MG capsule Take 2 g by mouth daily.        . Hypromellose (ARTIFICIAL TEARS OP) Apply to eye.        . losartan-hydrochlorothiazide (HYZAAR) 100-25 MG per tablet TAKE 1 TABLET BY MOUTH EVERY DAY  90 tablet  2  . metFORMIN (GLUCOPHAGE) 1000 MG tablet TAKE 1 TABLET TWICE DAILY  180 tablet  2  . metoprolol tartrate (LOPRESSOR) 25 MG tablet Take 1 tablet (25 mg total) by mouth 2 (two) times daily.  60 tablet  6  . Multiple Vitamins-Minerals (ICAPS PO) Take by mouth.        . simvastatin (ZOCOR) 40 MG tablet ONE BY MOUTH DAILY  90 tablet  2    No Known Allergies  Past Medical History  Diagnosis Date  . DM type 2 (diabetes mellitus, type 2) 2002  . Hypertension   . Hyperlipidemia   . Colon polyps 1996    villous adenoma  . CAD (coronary artery disease)     LHC 03/25/11 by Dr. Excell Seltzer:  pLAD 99%, oCFX 20-30%, pOM1 40%, dAVCFX 20-30%.  EF was normal on nuclear study.  He was treated with a Promus DES to his pLAD.     Past Surgical History  Procedure Date  . Cataract     Family History  Problem Relation Age of Onset  . Coronary artery disease Father 54    deceased  . Stomach cancer Mother 65    History   Social History  . Marital Status: Married    Spouse Name: N/A    Number of Children: N/A  . Years of Education: N/A   Occupational History  . Not on file.   Social History  Main Topics  . Smoking status: Former Smoker -- 1.0 packs/day    Quit date: 06/07/1958  . Smokeless tobacco: Never Used  . Alcohol Use: No  . Drug Use: No  . Sexually Active: Not on file   Other Topics Concern  . Not on file   Social History Narrative  . No narrative on file    Fourteen point review of systems was negative except as noted in HPI and PMH   PHYSICAL EXAMINATION  Blood pressure 136/62, pulse 57, height 5\' 4"  (1.626 m), weight 131 lb (59.421 kg).   Well developed and nourished older Caucasian male appearing his stated agein no acute distress HENT normal Neck supple   Back without scoliosis  Clear Regular rate and rhythm,2/6 murmur RUSB ositive S4 Abd-soft with active BS without hepatomegaly or midline pulsation No Clubbing cyanosis edema Skin-warm and dry   .

## 2011-08-12 ENCOUNTER — Other Ambulatory Visit: Payer: Self-pay | Admitting: Internal Medicine

## 2011-09-30 ENCOUNTER — Telehealth: Payer: Self-pay | Admitting: Internal Medicine

## 2011-09-30 NOTE — Telephone Encounter (Addendum)
Pt stated Dr Graciela Husbands said he needs bloodwork and to follow up with Dr Cato Mulligan. What labs can I sch?

## 2011-10-04 NOTE — Telephone Encounter (Signed)
Bmet, lfts, lipids, A1C--- 250.02

## 2011-10-04 NOTE — Telephone Encounter (Signed)
Pt is sch for 10-21-2011 and labs 10-14-2011

## 2011-10-14 ENCOUNTER — Other Ambulatory Visit (INDEPENDENT_AMBULATORY_CARE_PROVIDER_SITE_OTHER): Payer: Medicare PPO

## 2011-10-14 DIAGNOSIS — E119 Type 2 diabetes mellitus without complications: Secondary | ICD-10-CM

## 2011-10-14 LAB — BASIC METABOLIC PANEL
BUN: 34 mg/dL — ABNORMAL HIGH (ref 6–23)
CO2: 28 mEq/L (ref 19–32)
Calcium: 9.3 mg/dL (ref 8.4–10.5)
Creatinine, Ser: 1.4 mg/dL (ref 0.4–1.5)

## 2011-10-14 LAB — LIPID PANEL
Cholesterol: 122 mg/dL (ref 0–200)
HDL: 44.3 mg/dL (ref >39.00)
Total CHOL/HDL Ratio: 3
Triglycerides: 106 mg/dL (ref 0.0–149.0)

## 2011-10-14 LAB — HEPATIC FUNCTION PANEL
Bilirubin, Direct: 0 mg/dL (ref 0.0–0.3)
Total Bilirubin: 0.7 mg/dL (ref 0.3–1.2)
Total Protein: 7 g/dL (ref 6.0–8.3)

## 2011-10-19 ENCOUNTER — Other Ambulatory Visit: Payer: Self-pay | Admitting: Internal Medicine

## 2011-10-21 ENCOUNTER — Encounter: Payer: Self-pay | Admitting: Internal Medicine

## 2011-10-21 ENCOUNTER — Ambulatory Visit (INDEPENDENT_AMBULATORY_CARE_PROVIDER_SITE_OTHER): Payer: Medicare PPO | Admitting: Internal Medicine

## 2011-10-21 VITALS — BP 150/60 | HR 66 | Temp 97.7°F | Resp 16 | Wt 133.0 lb

## 2011-10-21 DIAGNOSIS — I10 Essential (primary) hypertension: Secondary | ICD-10-CM

## 2011-10-21 DIAGNOSIS — E785 Hyperlipidemia, unspecified: Secondary | ICD-10-CM

## 2011-10-21 DIAGNOSIS — I2510 Atherosclerotic heart disease of native coronary artery without angina pectoris: Secondary | ICD-10-CM

## 2011-10-21 DIAGNOSIS — E119 Type 2 diabetes mellitus without complications: Secondary | ICD-10-CM

## 2011-10-21 NOTE — Assessment & Plan Note (Signed)
Well controlled Continue meds 

## 2011-10-21 NOTE — Progress Notes (Signed)
Patient ID: [name], male   DOB: [birth year], 78 y.o.   MRN: [id number]  patient comes in for followup of multiple medical problems including type 2 diabetes, hyperlipidemia, hypertension. The patient does not check blood sugar or blood pressure at home. The patetient does not follow an exercise or diet program. The patient denies any polyuria, polydipsia.  In the past the patient has gone to diabetic treatment center. The patient is tolerating medications  Without difficulty. The patient does admit to medication compliance.   CAD-- no sxs  Past Medical History  Diagnosis Date  . DM type 2 (diabetes mellitus, type 2) 2002  . Hypertension   . Hyperlipidemia   . Colon polyps 1996    villous adenoma  . CAD (coronary artery disease)     LHC 03/25/11 by Dr. Excell Seltzer:  pLAD 99%, oCFX 20-30%, pOM1 40%, dAVCFX 20-30%.  EF was normal on nuclear study.  He was treated with a Promus DES to his pLAD.     History   Social History  . Marital Status: Married    Spouse Name: N/A    Number of Children: N/A  . Years of Education: N/A   Occupational History  . Not on file.   Social History Main Topics  . Smoking status: Former Smoker -- 1.0 packs/day    Quit date: 06/07/1958  . Smokeless tobacco: Never Used  . Alcohol Use: No  . Drug Use: No  . Sexually Active: Not on file   Other Topics Concern  . Not on file   Social History Narrative  . No narrative on file    Past Surgical History  Procedure Date  . Cataract     Family History  Problem Relation Age of Onset  . Coronary artery disease Father 49    deceased  . Stomach cancer Mother 78    No Known Allergies  Current Outpatient Prescriptions on File Prior to Visit  Medication Sig Dispense Refill  . aspirin 325 MG EC tablet Take 81 mg by mouth daily.       . clopidogrel (PLAVIX) 75 MG tablet Take 75 mg by mouth daily.        . felodipine (PLENDIL) 5 MG 24 hr tablet TAKE 1 TABLET EVERY DAY  100 tablet  3  . fish oil-omega-3  fatty acids 1000 MG capsule Take 2 g by mouth daily.        . Hypromellose (ARTIFICIAL TEARS OP) Apply to eye.        . losartan-hydrochlorothiazide (HYZAAR) 100-25 MG per tablet TAKE 1 TABLET BY MOUTH EVERY DAY  90 tablet  2  . metFORMIN (GLUCOPHAGE) 1000 MG tablet TAKE 1 TABLET TWICE DAILY  180 tablet  2  . metoprolol tartrate (LOPRESSOR) 25 MG tablet Take 1 tablet (25 mg total) by mouth 2 (two) times daily.  60 tablet  6  . Multiple Vitamins-Minerals (ICAPS PO) Take by mouth.        . simvastatin (ZOCOR) 40 MG tablet ONE BY MOUTH DAILY  90 tablet  2     patient denies chest pain, shortness of breath, orthopnea. Denies lower extremity edema, abdominal pain, change in appetite, change in bowel movements. Patient denies rashes, musculoskeletal complaints. No other specific complaints in a complete review of systems.   BP 150/60  Pulse 66  Temp(Src) 97.7 F (36.5 C) (Oral)  Resp 16  Wt 133 lb (60.328 kg)  well-developed well-nourished male in no acute distress. HEENT exam atraumatic, normocephalic, neck supple  without jugular venous distention. Chest clear to auscultation cardiac exam S1-S2 are regular. Abdominal exam overweight with bowel sounds, soft and nontender. Extremities no edema. Neurologic exam is alert with a normal gait.

## 2011-10-21 NOTE — Assessment & Plan Note (Signed)
No sxs Continue risk factor modifications

## 2011-10-21 NOTE — Assessment & Plan Note (Addendum)
BP Readings from Last 3 Encounters:  10/21/11 150/60  07/14/11 136/62  04/09/11 112/52   Home bps 120s/60s Continue meds

## 2011-10-21 NOTE — Assessment & Plan Note (Signed)
Reasonable control  Continue meds 

## 2011-10-25 ENCOUNTER — Other Ambulatory Visit: Payer: Self-pay | Admitting: Internal Medicine

## 2011-11-04 ENCOUNTER — Other Ambulatory Visit: Payer: Self-pay | Admitting: Internal Medicine

## 2011-11-04 MED ORDER — CLOPIDOGREL BISULFATE 75 MG PO TABS: 75.0000 mg | ORAL_TABLET | Freq: Every day | ORAL | Status: DC

## 2011-11-09 ENCOUNTER — Other Ambulatory Visit: Payer: Self-pay

## 2011-11-09 DIAGNOSIS — R931 Abnormal findings on diagnostic imaging of heart and coronary circulation: Secondary | ICD-10-CM

## 2011-11-09 MED ORDER — METOPROLOL TARTRATE 25 MG PO TABS: 25.0000 mg | ORAL_TABLET | Freq: Two times a day (BID) | ORAL | Status: DC

## 2011-11-09 NOTE — Telephone Encounter (Signed)
..   Requested Prescriptions   Signed Prescriptions Disp Refills  . metoprolol tartrate (LOPRESSOR) 25 MG tablet 60 tablet 6    Sig: Take 1 tablet (25 mg total) by mouth 2 (two) times daily.    Authorizing Provider: Duke Salvia    Ordering User: Christella Hartigan, Joelene Barriere Judie Petit

## 2011-12-31 ENCOUNTER — Other Ambulatory Visit: Payer: Self-pay | Admitting: Internal Medicine

## 2012-04-07 LAB — HM DIABETES EYE EXAM

## 2012-04-18 ENCOUNTER — Other Ambulatory Visit (INDEPENDENT_AMBULATORY_CARE_PROVIDER_SITE_OTHER): Payer: Medicare PPO

## 2012-04-18 DIAGNOSIS — E119 Type 2 diabetes mellitus without complications: Secondary | ICD-10-CM

## 2012-04-18 LAB — BASIC METABOLIC PANEL
BUN: 38 mg/dL — ABNORMAL HIGH (ref 6–23)
CO2: 31 mEq/L (ref 19–32)
Chloride: 103 mEq/L (ref 96–112)
Creatinine, Ser: 1.3 mg/dL (ref 0.4–1.5)
Potassium: 4.2 mEq/L (ref 3.5–5.1)

## 2012-04-18 LAB — LIPID PANEL
Cholesterol: 117 mg/dL (ref 0–200)
HDL: 39.4 mg/dL (ref >39.00)
LDL Cholesterol: 58 mg/dL (ref 0–99)
VLDL: 19.6 mg/dL (ref 0.0–40.0)

## 2012-04-18 LAB — HEPATIC FUNCTION PANEL
ALT: 23 U/L (ref 0–53)
Bilirubin, Direct: 0.1 mg/dL (ref 0.0–0.3)
Total Protein: 6.6 g/dL (ref 6.0–8.3)

## 2012-04-18 LAB — HEMOGLOBIN A1C: Hgb A1c MFr Bld: 7 % — ABNORMAL HIGH (ref 4.6–6.5)

## 2012-04-24 ENCOUNTER — Ambulatory Visit (INDEPENDENT_AMBULATORY_CARE_PROVIDER_SITE_OTHER): Payer: Medicare PPO | Admitting: Internal Medicine

## 2012-04-24 ENCOUNTER — Encounter: Payer: Self-pay | Admitting: Internal Medicine

## 2012-04-24 VITALS — BP 124/56 | HR 64 | Temp 97.9°F | Wt 123.0 lb

## 2012-04-24 DIAGNOSIS — Z23 Encounter for immunization: Secondary | ICD-10-CM

## 2012-04-24 DIAGNOSIS — I2510 Atherosclerotic heart disease of native coronary artery without angina pectoris: Secondary | ICD-10-CM

## 2012-04-24 DIAGNOSIS — I10 Essential (primary) hypertension: Secondary | ICD-10-CM

## 2012-04-24 DIAGNOSIS — E119 Type 2 diabetes mellitus without complications: Secondary | ICD-10-CM

## 2012-04-24 DIAGNOSIS — E785 Hyperlipidemia, unspecified: Secondary | ICD-10-CM

## 2012-04-24 LAB — HM DIABETES FOOT EXAM

## 2012-04-24 NOTE — Assessment & Plan Note (Signed)
Well controlled Continue same meds 

## 2012-04-24 NOTE — Assessment & Plan Note (Signed)
Adequate control Continue same meds 

## 2012-04-24 NOTE — Assessment & Plan Note (Signed)
No sxs Continue risk factor modification 

## 2012-04-24 NOTE — Assessment & Plan Note (Signed)
Well controlled continue same meds

## 2012-04-24 NOTE — Progress Notes (Signed)
Patient ID: [name], male   DOB: 10-17-1931, 78 y.o.   MRN: [id number]  patient comes in for followup of multiple medical problems including type 2 diabetes, hyperlipidemia, hypertension. The patient does not check blood sugar or blood pressure at home. The patetient does not follow an exercise or diet program. The patient denies any polyuria, polydipsia.  In the past the patient has gone to diabetic treatment center. The patient is tolerating medications  Without difficulty. The patient does admit to medication compliance.   CAD-- no sxs He remains very active and still works full time  Past Medical History  Diagnosis Date  . DM type 2 (diabetes mellitus, type 2) 2002  . Hypertension   . Hyperlipidemia   . Colon polyps 1996    villous adenoma  . CAD (coronary artery disease)     LHC 03/25/11 by Dr. Excell Seltzer:  pLAD 99%, oCFX 20-30%, pOM1 40%, dAVCFX 20-30%.  EF was normal on nuclear study.  He was treated with a Promus DES to his pLAD.     History   Social History  . Marital Status: Married    Spouse Name: N/A    Number of Children: N/A  . Years of Education: N/A   Occupational History  . Not on file.   Social History Main Topics  . Smoking status: Former Smoker -- 1.0 packs/day    Quit date: 06/07/1958  . Smokeless tobacco: Never Used  . Alcohol Use: No  . Drug Use: No  . Sexually Active: Not on file   Other Topics Concern  . Not on file   Social History Narrative  . No narrative on file    Past Surgical History  Procedure Date  . Cataract     Family History  Problem Relation Age of Onset  . Coronary artery disease Father 49    deceased  . Stomach cancer Mother 55    No Known Allergies  Current Outpatient Prescriptions on File Prior to Visit  Medication Sig Dispense Refill  . aspirin 325 MG EC tablet Take 81 mg by mouth daily.       . clopidogrel (PLAVIX) 75 MG tablet Take 1 tablet (75 mg total) by mouth daily.  30 tablet  11  . felodipine (PLENDIL) 5 MG  24 hr tablet TAKE 1 TABLET EVERY DAY  100 tablet  2  . fish oil-omega-3 fatty acids 1000 MG capsule Take 2 g by mouth daily.        . Hypromellose (ARTIFICIAL TEARS OP) Apply to eye.        . losartan-hydrochlorothiazide (HYZAAR) 100-25 MG per tablet TAKE 1 TABLET BY MOUTH EVERY DAY  90 tablet  2  . metFORMIN (GLUCOPHAGE) 1000 MG tablet TAKE 1 TABLET TWICE DAILY  180 tablet  2  . metoprolol tartrate (LOPRESSOR) 25 MG tablet Take 1 tablet (25 mg total) by mouth 2 (two) times daily.  60 tablet  6  . Multiple Vitamins-Minerals (ICAPS PO) Take by mouth.        . simvastatin (ZOCOR) 40 MG tablet ONE BY MOUTH DAILY  90 tablet  2     patient denies chest pain, shortness of breath, orthopnea. Denies lower extremity edema, abdominal pain, change in appetite, change in bowel movements. Patient denies rashes, musculoskeletal complaints. No other specific complaints in a complete review of systems.   BP 124/56  Pulse 64  Temp 97.9 F (36.6 C) (Oral)  Wt 123 lb (55.792 kg)  well-developed well-nourished male in no  acute distress. HEENT exam atraumatic, normocephalic, neck supple without jugular venous distention. Chest clear to auscultation cardiac exam S1-S2 are regular. Abdominal exam overweight with bowel sounds, soft and nontender. Extremities no edema. Neurologic exam is alert with a normal gait.  A/p- he had fluy shot today

## 2012-06-12 ENCOUNTER — Other Ambulatory Visit: Payer: Self-pay | Admitting: Internal Medicine

## 2012-07-05 ENCOUNTER — Other Ambulatory Visit: Payer: Self-pay | Admitting: Internal Medicine

## 2012-07-06 ENCOUNTER — Other Ambulatory Visit: Payer: Self-pay

## 2012-07-06 DIAGNOSIS — R931 Abnormal findings on diagnostic imaging of heart and coronary circulation: Secondary | ICD-10-CM

## 2012-07-06 MED ORDER — METOPROLOL TARTRATE 25 MG PO TABS: 25.0000 mg | ORAL_TABLET | Freq: Two times a day (BID) | ORAL | Status: DC

## 2012-07-20 ENCOUNTER — Other Ambulatory Visit: Payer: Self-pay | Admitting: Internal Medicine

## 2012-10-16 ENCOUNTER — Other Ambulatory Visit (INDEPENDENT_AMBULATORY_CARE_PROVIDER_SITE_OTHER): Payer: Medicare PPO

## 2012-10-16 DIAGNOSIS — E119 Type 2 diabetes mellitus without complications: Secondary | ICD-10-CM

## 2012-10-16 LAB — HEPATIC FUNCTION PANEL
ALT: 16 U/L (ref 0–53)
AST: 19 U/L (ref 0–37)
Alkaline Phosphatase: 76 U/L (ref 39–117)
Bilirubin, Direct: 0 mg/dL (ref 0.0–0.3)
Total Bilirubin: 0.4 mg/dL (ref 0.3–1.2)

## 2012-10-16 LAB — BASIC METABOLIC PANEL
BUN: 39 mg/dL — ABNORMAL HIGH (ref 6–23)
Creatinine, Ser: 1.4 mg/dL (ref 0.4–1.5)
GFR: 52.43 mL/min — ABNORMAL LOW (ref >60.00)
Potassium: 4.7 mEq/L (ref 3.5–5.1)

## 2012-10-16 LAB — HEMOGLOBIN A1C: Hgb A1c MFr Bld: 7 % — ABNORMAL HIGH (ref 4.6–6.5)

## 2012-10-23 ENCOUNTER — Ambulatory Visit (INDEPENDENT_AMBULATORY_CARE_PROVIDER_SITE_OTHER): Payer: Medicare PPO | Admitting: Internal Medicine

## 2012-10-23 ENCOUNTER — Encounter: Payer: Self-pay | Admitting: Internal Medicine

## 2012-10-23 VITALS — BP 114/50 | HR 68 | Temp 97.9°F | Wt 123.0 lb

## 2012-10-23 DIAGNOSIS — I10 Essential (primary) hypertension: Secondary | ICD-10-CM

## 2012-10-23 DIAGNOSIS — D649 Anemia, unspecified: Secondary | ICD-10-CM

## 2012-10-23 DIAGNOSIS — E785 Hyperlipidemia, unspecified: Secondary | ICD-10-CM

## 2012-10-23 DIAGNOSIS — E119 Type 2 diabetes mellitus without complications: Secondary | ICD-10-CM

## 2012-10-23 DIAGNOSIS — E1159 Type 2 diabetes mellitus with other circulatory complications: Secondary | ICD-10-CM

## 2012-10-23 NOTE — Assessment & Plan Note (Signed)
CBC:    Component Value Date/Time   WBC 11.1* 03/26/2011 0530   HGB 10.9* 03/26/2011 0530   HCT 32.8* 03/26/2011 0530   PLT 231 03/26/2011 0530   MCV 88.6 03/26/2011 0530   NEUTROABS 5.4 06/30/2010 0833   LYMPHSABS 2.4 06/30/2010 0833   MONOABS 0.7 06/30/2010 0833   EOSABS 0.2 06/30/2010 0833   BASOSABS 0.1 06/30/2010 0833   needs f/u Check labs today

## 2012-10-23 NOTE — Assessment & Plan Note (Signed)
bp is probably too low at home Stop felodipine

## 2012-10-23 NOTE — Assessment & Plan Note (Signed)
Lab Results  Component Value Date   HGBA1C 7.0* 10/16/2012   Well controlled Continue same meds

## 2012-10-23 NOTE — Assessment & Plan Note (Signed)
Well controlled, continue same meds  

## 2012-10-23 NOTE — Progress Notes (Signed)
Patient ID: [name], male   DOB: [birth year], 79 y.o.   MRN: [id number]   patient comes in for followup of multiple medical problems including type 2 diabetes, hyperlipidemia, hypertension. The patient does not check blood sugar or blood pressure at home. The patetient does not follow an exercise or diet program. The patient denies any polyuria, polydipsia.  In the past the patient has gone to diabetic treatment center. The patient is tolerating medications  Without difficulty. The patient does admit to medication compliance.   Past Medical History  Diagnosis Date  . DM type 2 (diabetes mellitus, type 2) 2002  . Hypertension   . Hyperlipidemia   . Colon polyps 1996    villous adenoma  . CAD (coronary artery disease)     LHC 03/25/11 by Dr. Excell Seltzer:  pLAD 99%, oCFX 20-30%, pOM1 40%, dAVCFX 20-30%.  EF was normal on nuclear study.  He was treated with a Promus DES to his pLAD.     History   Social History  . Marital Status: Married    Spouse Name: N/A    Number of Children: N/A  . Years of Education: N/A   Occupational History  . Not on file.   Social History Main Topics  . Smoking status: Former Smoker -- 1.00 packs/day    Quit date: 06/07/1958  . Smokeless tobacco: Never Used  . Alcohol Use: No  . Drug Use: No  . Sexually Active: Not on file   Other Topics Concern  . Not on file   Social History Narrative  . No narrative on file    Past Surgical History  Procedure Laterality Date  . Cataract      Family History  Problem Relation Age of Onset  . Coronary artery disease Father 85    deceased  . Stomach cancer Mother 73    No Known Allergies  Current Outpatient Prescriptions on File Prior to Visit  Medication Sig Dispense Refill  . clopidogrel (PLAVIX) 75 MG tablet Take 1 tablet (75 mg total) by mouth daily.  30 tablet  11  . felodipine (PLENDIL) 5 MG 24 hr tablet TAKE 1 TABLET EVERY DAY  100 tablet  2  . fish oil-omega-3 fatty acids 1000 MG capsule Take 2 g by  mouth daily.        . Hypromellose (ARTIFICIAL TEARS OP) Apply to eye.        . losartan-hydrochlorothiazide (HYZAAR) 100-25 MG per tablet TAKE 1 TABLET BY MOUTH EVERY DAY  90 tablet  2  . metFORMIN (GLUCOPHAGE) 1000 MG tablet TAKE 1 TABLET TWICE DAILY  180 tablet  2  . metoprolol tartrate (LOPRESSOR) 25 MG tablet Take 1 tablet (25 mg total) by mouth 2 (two) times daily.  60 tablet  6  . Multiple Vitamins-Minerals (ICAPS PO) Take by mouth.        . simvastatin (ZOCOR) 40 MG tablet ONE BY MOUTH DAILY  90 tablet  2   No current facility-administered medications on file prior to visit.     patient denies chest pain, shortness of breath, orthopnea. Denies lower extremity edema, abdominal pain, change in appetite, change in bowel movements. Patient denies rashes, musculoskeletal complaints. No other specific complaints in a complete review of systems.   BP 114/50  Pulse 68  Temp(Src) 97.9 F (36.6 C) (Oral)  Wt 123 lb (55.792 kg)  BMI 21.1 kg/m2  well-developed well-nourished male in no acute distress. HEENT exam atraumatic, normocephalic, neck supple without jugular venous distention. Chest  clear to auscultation cardiac exam S1-S2 are regular. Abdominal exam overweight with bowel sounds, soft and nontender. Extremities no edema. Neurologic exam is alert with a normal gait.

## 2012-11-14 ENCOUNTER — Telehealth: Payer: Self-pay | Admitting: *Deleted

## 2012-11-14 MED ORDER — CLOPIDOGREL BISULFATE 75 MG PO TABS: 75.0000 mg | ORAL_TABLET | Freq: Every day | ORAL | Status: DC

## 2012-11-14 NOTE — Telephone Encounter (Signed)
Refill request received for plavix 75 mg once daily. I left a message on the pharmacy RX line - ok to refill # 30 with 1 refill. Needs office visit.

## 2012-12-06 ENCOUNTER — Encounter: Payer: Self-pay | Admitting: *Deleted

## 2012-12-07 ENCOUNTER — Encounter: Payer: Self-pay | Admitting: Internal Medicine

## 2012-12-07 ENCOUNTER — Ambulatory Visit (INDEPENDENT_AMBULATORY_CARE_PROVIDER_SITE_OTHER): Payer: Medicare PPO | Admitting: Internal Medicine

## 2012-12-07 VITALS — BP 202/76 | HR 53 | Ht 64.0 in | Wt 121.0 lb

## 2012-12-07 DIAGNOSIS — I10 Essential (primary) hypertension: Secondary | ICD-10-CM

## 2012-12-07 DIAGNOSIS — I2510 Atherosclerotic heart disease of native coronary artery without angina pectoris: Secondary | ICD-10-CM

## 2012-12-07 MED ORDER — CLONIDINE HCL 0.1 MG PO TABS
0.1000 mg | ORAL_TABLET | Freq: Once | ORAL | Status: AC
  Administered 2012-12-07: 0.1 mg via ORAL

## 2012-12-07 MED ORDER — FELODIPINE ER 5 MG PO TB24: 5.0000 mg | ORAL_TABLET | Freq: Every day | ORAL | Status: DC

## 2012-12-07 MED ORDER — CLONIDINE HCL 0.1 MG PO TABS
0.2000 mg | ORAL_TABLET | Freq: Once | ORAL | Status: AC
  Administered 2012-12-07: 0.2 mg via ORAL

## 2012-12-07 NOTE — Assessment & Plan Note (Signed)
I reviewed the cath report and discussed with Dr. Erlinda Hong. He had mild residual disease at the site of his LAD stent. It was recommended a Myoview be repeated. In the event that this is normal/low risk, we will discontinue the Plavix; in the event that it is not, he'll need repeat catheterization

## 2012-12-07 NOTE — Patient Instructions (Signed)
Your physician has recommended you make the following change in your medication:  1) Restart felodipine 5 mg one tablet by mouth daily.  Your physician has requested that you have an exercise stress myoview. For further information please visit [email or web address] Please follow instruction sheet, as given.  Your physician recommends that you schedule a follow-up appointment in: 2 weeks with Dr. Cato Mulligan for blood pressure follow up.  Your physician wants you to follow-up in: 1 year with Dr. Graciela Husbands. You will receive a reminder letter in the mail two months in advance. If you don't receive a letter, please call our office to schedule the follow-up appointment.

## 2012-12-07 NOTE — Assessment & Plan Note (Addendum)
Blood pressure is very high today. He has no significant associated symptoms. We will give him clonidine the office. I will have him resume his felodopine,  And asked that he follow up with Dr. Cato Mulligan next week or so for  blood pressure followup  Blood pressure reduced from 205-190/20 minutes following clonidine; he was given a second dose of 0.1 mg was told to go home and resume his amlodipine.

## 2012-12-07 NOTE — Progress Notes (Signed)
Patient Care Team: Lindley Magnus, MD as PCP - General   HPI  [name] is a 79 y.o. male Seen in followup for ischemic heart disease with prior LAD stenting 2012. He has diabetes and hypertension  He comes in today for routine followup. He would like to come off of his Plavix.  He saw Dr. Cato Mulligan in clinic a month ago because of concerns about low blood pressure home (no clear explanation as to why that was thought) his felodopine was discontinued. An interval BP was 140   Past Medical History  Diagnosis Date  . DM type 2 (diabetes mellitus, type 2) 2002  . Hypertension   . Hyperlipidemia   . Colon polyps 1996    villous adenoma  . CAD (coronary artery disease)     LHC 03/25/11 by Dr. Excell Seltzer:  pLAD 99%, oCFX 20-30%, pOM1 40%, dAVCFX 20-30%.  EF was normal on nuclear study.  He was treated with a Promus DES to his pLAD.     Past Surgical History  Procedure Laterality Date  . Cataract      Current Outpatient Prescriptions  Medication Sig Dispense Refill  . aspirin 81 MG tablet Take 81 mg by mouth daily.      . clopidogrel (PLAVIX) 75 MG tablet Take 1 tablet (75 mg total) by mouth daily.  30 tablet  1  . fish oil-omega-3 fatty acids 1000 MG capsule Take 2 g by mouth daily.        . Hypromellose (ARTIFICIAL TEARS OP) Apply to eye.        . losartan-hydrochlorothiazide (HYZAAR) 100-25 MG per tablet TAKE 1 TABLET BY MOUTH EVERY DAY  90 tablet  2  . metFORMIN (GLUCOPHAGE) 1000 MG tablet TAKE 1 TABLET TWICE DAILY  180 tablet  2  . metoprolol tartrate (LOPRESSOR) 25 MG tablet Take 1 tablet (25 mg total) by mouth 2 (two) times daily.  60 tablet  6  . Multiple Vitamins-Minerals (ICAPS PO) Take by mouth.        . simvastatin (ZOCOR) 40 MG tablet ONE BY MOUTH DAILY  90 tablet  2   No current facility-administered medications for this visit.    No Known Allergies  Review of Systems negative except from HPI and PMH  Physical Exam BP 202/76  Pulse 53  Ht 5\' 4"  (1.626 m)  Wt 121  lb (54.885 kg)  BMI 20.76 kg/m2 Well developed and nourished in no acute distress HENT normal Neck supple with JVP-flat Clear Regular rate and rhythm, no murmurs or gallops Abd-soft with active BS No Clubbing cyanosis tr edema Skin-warm and dry A & Oriented  Grossly normal sensory and motor function   ECG demonstrates sinus rhythm at 53 beats per minute  Assessment and  Plan

## 2012-12-12 ENCOUNTER — Ambulatory Visit (INDEPENDENT_AMBULATORY_CARE_PROVIDER_SITE_OTHER): Payer: Medicare PPO | Admitting: Internal Medicine

## 2012-12-12 ENCOUNTER — Encounter: Payer: Self-pay | Admitting: Internal Medicine

## 2012-12-12 VITALS — BP 102/52 | HR 64 | Temp 97.8°F | Wt 119.0 lb

## 2012-12-12 DIAGNOSIS — N401 Enlarged prostate with lower urinary tract symptoms: Secondary | ICD-10-CM

## 2012-12-12 DIAGNOSIS — N139 Obstructive and reflux uropathy, unspecified: Secondary | ICD-10-CM

## 2012-12-12 DIAGNOSIS — I10 Essential (primary) hypertension: Secondary | ICD-10-CM

## 2012-12-12 MED ORDER — FELODIPINE ER 2.5 MG PO TB24: 2.5000 mg | ORAL_TABLET | Freq: Every day | ORAL | Status: DC

## 2012-12-12 MED ORDER — TAMSULOSIN HCL 0.4 MG PO CAPS: 0.4000 mg | ORAL_CAPSULE | Freq: Every day | ORAL | Status: DC

## 2012-12-12 MED ORDER — FELODIPINE ER 2.5 MG PO TB24: 5.0000 mg | ORAL_TABLET | Freq: Every day | ORAL | Status: DC

## 2012-12-13 DIAGNOSIS — N138 Other obstructive and reflux uropathy: Secondary | ICD-10-CM

## 2012-12-13 HISTORY — DX: Other obstructive and reflux uropathy: N13.8

## 2012-12-13 NOTE — Progress Notes (Signed)
  Subjective:    Patient ID: [name], male    DOB: Nov 02, 1931, 79 y.o.   MRN: [id number]  HPI  Patient has noted a low blood pressure at times. This is occasionally associated with symptoms of hypotension. No other specific complaints except for nocturia. He will awaken 4-6 times nightly.  Reviewed past medical history, medications.  Review of Systems No specific complaints.    Objective:   Physical Exam Reviewed vital signs. Note low blood pressure. Chest is clear to auscultation. Cardiac exam S1-S2 are regular.       Assessment & Plan:

## 2012-12-13 NOTE — Assessment & Plan Note (Signed)
Probably over treated. Will decrease felodipine. He may be a little discontinue this medication. Note he is also on losartan and metoprolol.

## 2012-12-13 NOTE — Assessment & Plan Note (Signed)
This is a new problem. It sounds quite significant. We'll start Flomax. He understands that this may worsen hypotension. Note felodipine has been decreased. He'll call me if his symptoms do not resolve related to urinary obstruction. He will also call me if he develops symptoms of hypotension.

## 2012-12-18 ENCOUNTER — Ambulatory Visit (HOSPITAL_COMMUNITY): Payer: Medicare PPO | Attending: Cardiovascular Disease | Admitting: Radiology

## 2012-12-18 VITALS — BP 186/66 | Ht 64.0 in | Wt 112.0 lb

## 2012-12-18 DIAGNOSIS — E119 Type 2 diabetes mellitus without complications: Secondary | ICD-10-CM | POA: Insufficient documentation

## 2012-12-18 DIAGNOSIS — Z8249 Family history of ischemic heart disease and other diseases of the circulatory system: Secondary | ICD-10-CM | POA: Insufficient documentation

## 2012-12-18 DIAGNOSIS — I2510 Atherosclerotic heart disease of native coronary artery without angina pectoris: Secondary | ICD-10-CM

## 2012-12-18 DIAGNOSIS — I10 Essential (primary) hypertension: Secondary | ICD-10-CM | POA: Insufficient documentation

## 2012-12-18 DIAGNOSIS — R0989 Other specified symptoms and signs involving the circulatory and respiratory systems: Secondary | ICD-10-CM | POA: Insufficient documentation

## 2012-12-18 DIAGNOSIS — R002 Palpitations: Secondary | ICD-10-CM | POA: Insufficient documentation

## 2012-12-18 DIAGNOSIS — Z87891 Personal history of nicotine dependence: Secondary | ICD-10-CM | POA: Insufficient documentation

## 2012-12-18 DIAGNOSIS — R0609 Other forms of dyspnea: Secondary | ICD-10-CM | POA: Insufficient documentation

## 2012-12-18 MED ORDER — TECHNETIUM TC 99M SESTAMIBI GENERIC - CARDIOLITE
10.0000 | Freq: Once | INTRAVENOUS | Status: AC | PRN
  Administered 2012-12-18: 10 via INTRAVENOUS

## 2012-12-18 MED ORDER — TECHNETIUM TC 99M SESTAMIBI GENERIC - CARDIOLITE
30.0000 | Freq: Once | INTRAVENOUS | Status: AC | PRN
  Administered 2012-12-18: 30 via INTRAVENOUS

## 2012-12-18 NOTE — Progress Notes (Signed)
[facility] SITE 3 NUCLEAR MED [address] [place], [place] [address] [phone]    Cardiology Nuclear Med Study  [name] is a 79 y.o. male     MRN : [id number]     DOB: 07/26/1931  Procedure Date: 12/18/2012  Nuclear Med Background Indication for Stress Test:  Evaluation for Ischemia, Stent Patency and PTCA Patency History:  2001 ECHO: Nl EF GXT: Nl per pt, 10/12 MPS: lg anterior ischemia, anteroapical scar EF: 54% Cardiac Risk Factors: Family History - CAD, History of Smoking, Hypertension, Lipids and NIDDM  Symptoms:  Palpitations   Nuclear Pre-Procedure Caffeine/Decaff Intake:  None NPO After: 7:00pm   Lungs:  clear O2 Sat: 97% on room air. IV 0.9% NS with Angio Cath:  22g  IV Site: R Hand  IV Started by:  Cathlyn Parsons, RN  Chest Size (in):  38 Cup Size: n/a  Height: 5\' 4"  (1.626 m)  Weight:  112 lb (50.803 kg)  BMI:  Body mass index is 19.22 kg/(m^2). Tech Comments:  Lopressor held x 24 hrs    Nuclear Med Study 1 or 2 day study: 1 day  Stress Test Type:  Stress  Reading MD: Charlton Haws, MD  Order Authorizing Provider:  Ferman Hamming  Resting Radionuclide: Technetium 75m Sestamibi  Resting Radionuclide Dose: 11.0 mCi   Stress Radionuclide:  Technetium 28m Sestamibi  Stress Radionuclide Dose: 33.0 mCi           Stress Protocol Rest HR: 53 Stress HR: 146  Rest BP: 186/66 Stress BP: 174/56  Exercise Time (min): 6:00 METS: 7.0   Predicted Max HR: 142 bpm % Max HR: 102.82 bpm Rate Pressure Product: 21308   Dose of Adenosine (mg):  n/a Dose of Lexiscan: n/a mg  Dose of Atropine (mg): n/a Dose of Dobutamine: n/a mcg/kg/min (at max HR)  Stress Test Technologist: Milana Na, EMT-P  Nuclear Technologist:  Harlow Asa, CNMT     Rest Procedure:  Myocardial perfusion imaging was performed at rest 45 minutes following the intravenous administration of Technetium 106m Sestamibi. Rest ECG: NSR - Normal EKG  Stress Procedure:  The patient  exercised on the treadmill utilizing the Bruce Protocol for 6:00 minutes. The patient stopped due to doe and denied any chest pain.  Technetium 43m Sestamibi was injected at peak exercise and myocardial perfusion imaging was performed after a brief delay. Stress ECG: No significant change from baseline ECG  QPS Raw Data Images:  Normal; no motion artifact; normal heart/lung ratio. Stress Images:  Normal homogeneous uptake in all areas of the myocardium. Rest Images:  Normal homogeneous uptake in all areas of the myocardium. Subtraction (SDS):  SDS 4 scattered Transient Ischemic Dilatation (Normal <1.22):  n/a Lung/Heart Ratio (Normal <0.45):  0.38  Quantitative Gated Spect Images QGS EDV:  79 ml QGS ESV:  29 ml  Impression Exercise Capacity:  Fair exercise capacity. BP Response:  Normal blood pressure response. Clinical Symptoms:  No significant symptoms noted. ECG Impression:  No significant ST segment change suggestive of ischemia. Comparison with Prior Nuclear Study: No images to compare  Overall Impression:  Normal stress nuclear study.  LV Ejection Fraction: 63%.  LV Wall Motion:  NL LV Function; NL Wall Motion  Charlton Haws

## 2012-12-26 ENCOUNTER — Telehealth: Payer: Self-pay | Admitting: Internal Medicine

## 2012-12-26 NOTE — Telephone Encounter (Signed)
Spoke with pt, aware myoview normal and per dr Graciela Husbands pt can stop his plavix.

## 2012-12-26 NOTE — Telephone Encounter (Signed)
Follow up  Pt is calling regarding his stress test

## 2013-03-06 ENCOUNTER — Other Ambulatory Visit: Payer: Self-pay | Admitting: Cardiology

## 2013-03-09 ENCOUNTER — Other Ambulatory Visit: Payer: Self-pay | Admitting: Internal Medicine

## 2013-04-12 ENCOUNTER — Other Ambulatory Visit: Payer: Self-pay

## 2013-04-23 ENCOUNTER — Ambulatory Visit: Payer: Medicare PPO | Admitting: Internal Medicine

## 2013-04-29 ENCOUNTER — Other Ambulatory Visit: Payer: Self-pay | Admitting: Internal Medicine

## 2013-04-29 NOTE — Assessment & Plan Note (Signed)
Well controlled Continue same meds 

## 2013-04-29 NOTE — Assessment & Plan Note (Signed)
No sxs Continue risk factor modification 

## 2013-04-29 NOTE — Progress Notes (Signed)
patient comes in for followup of multiple medical problems including type 2 diabetes, hyperlipidemia, hypertension. The patient does not check blood sugar or blood pressure at home. The patetient does  follow an exercise and diet program. The patient denies any polyuria, polydipsia. The patient is tolerating medications  Without difficulty. The patient does admit to medication compliance.   Past Medical History  Diagnosis Date  . DM type 2 (diabetes mellitus, type 2) 2002  . Hypertension   . Hyperlipidemia   . Colon polyps 1996    villous adenoma  . CAD (coronary artery disease)     LHC 03/25/11 by Dr. Excell Seltzer:  pLAD 99%, oCFX 20-30%, pOM1 40%, dAVCFX 20-30%.  EF was normal on nuclear study.  He was treated with a Promus DES to his pLAD.     History   Social History  . Marital Status: Married    Spouse Name: N/A    Number of Children: N/A  . Years of Education: N/A   Occupational History  . Not on file.   Social History Main Topics  . Smoking status: Former Smoker -- 1.00 packs/day    Quit date: 06/07/1958  . Smokeless tobacco: Never Used  . Alcohol Use: No  . Drug Use: No  . Sexual Activity: Not on file   Other Topics Concern  . Not on file   Social History Narrative  . No narrative on file    Past Surgical History  Procedure Laterality Date  . Cataract      Family History  Problem Relation Age of Onset  . Coronary artery disease Father 60    deceased  . Stomach cancer Mother 62    No Known Allergies  Reviewed meds   patient denies chest pain, shortness of breath, orthopnea. Denies lower extremity edema, abdominal pain, change in appetite, change in bowel movements. Patient denies rashes, musculoskeletal complaints. No other specific complaints in a complete review of systems.   Reviewed vitals  well-developed well-nourished male in no acute distress. HEENT exam atraumatic, normocephalic, neck supple without jugular venous distention. Chest clear to  auscultation cardiac exam S1-S2 are regular. Abdominal exam overweight with bowel sounds, soft and nontender. Extremities no edema. Neurologic exam is alert with a normal gait.

## 2013-04-29 NOTE — Assessment & Plan Note (Signed)
Check a1c today

## 2013-04-30 ENCOUNTER — Ambulatory Visit (INDEPENDENT_AMBULATORY_CARE_PROVIDER_SITE_OTHER): Payer: Medicare PPO | Admitting: Internal Medicine

## 2013-04-30 ENCOUNTER — Ambulatory Visit: Payer: Medicare PPO | Admitting: Internal Medicine

## 2013-04-30 ENCOUNTER — Encounter: Payer: Self-pay | Admitting: Internal Medicine

## 2013-04-30 VITALS — BP 132/54 | HR 72 | Temp 97.9°F | Ht 64.0 in | Wt 126.0 lb

## 2013-04-30 DIAGNOSIS — E1159 Type 2 diabetes mellitus with other circulatory complications: Secondary | ICD-10-CM

## 2013-04-30 DIAGNOSIS — E119 Type 2 diabetes mellitus without complications: Secondary | ICD-10-CM

## 2013-04-30 DIAGNOSIS — I10 Essential (primary) hypertension: Secondary | ICD-10-CM

## 2013-04-30 DIAGNOSIS — I2510 Atherosclerotic heart disease of native coronary artery without angina pectoris: Secondary | ICD-10-CM

## 2013-04-30 LAB — BASIC METABOLIC PANEL
CO2: 31 mEq/L (ref 19–32)
Calcium: 9.2 mg/dL (ref 8.4–10.5)
Chloride: 102 mEq/L (ref 96–112)
Sodium: 140 mEq/L (ref 135–145)

## 2013-04-30 LAB — HEPATIC FUNCTION PANEL
Albumin: 3.8 g/dL (ref 3.5–5.2)
Alkaline Phosphatase: 76 U/L (ref 39–117)
Total Protein: 6.9 g/dL (ref 6.0–8.3)

## 2013-04-30 LAB — LIPID PANEL
HDL: 42.1 mg/dL (ref >39.00)
Total CHOL/HDL Ratio: 3
Triglycerides: 56 mg/dL (ref 0.0–149.0)

## 2013-04-30 LAB — HEMOGLOBIN A1C: Hgb A1c MFr Bld: 6.9 % — ABNORMAL HIGH (ref 4.6–6.5)

## 2013-04-30 NOTE — Progress Notes (Signed)
Pre visit review using our clinic review tool, if applicable. No additional management support is needed unless otherwise documented below in the visit note. 

## 2013-05-31 ENCOUNTER — Other Ambulatory Visit: Payer: Self-pay | Admitting: Internal Medicine

## 2013-10-01 ENCOUNTER — Other Ambulatory Visit: Payer: Self-pay | Admitting: Internal Medicine

## 2013-10-23 ENCOUNTER — Ambulatory Visit (INDEPENDENT_AMBULATORY_CARE_PROVIDER_SITE_OTHER): Payer: Medicare HMO | Admitting: Internal Medicine

## 2013-10-23 ENCOUNTER — Encounter: Payer: Self-pay | Admitting: Internal Medicine

## 2013-10-23 VITALS — BP 110/60 | HR 82 | Temp 97.9°F | Wt 120.0 lb

## 2013-10-23 DIAGNOSIS — E119 Type 2 diabetes mellitus without complications: Secondary | ICD-10-CM

## 2013-10-23 DIAGNOSIS — I2510 Atherosclerotic heart disease of native coronary artery without angina pectoris: Secondary | ICD-10-CM

## 2013-10-23 DIAGNOSIS — I10 Essential (primary) hypertension: Secondary | ICD-10-CM

## 2013-10-23 LAB — HEPATIC FUNCTION PANEL
ALT: 14 U/L (ref 0–53)
AST: 16 U/L (ref 0–37)
Albumin: 3.8 g/dL (ref 3.5–5.2)
Alkaline Phosphatase: 62 U/L (ref 39–117)
Bilirubin, Direct: 0 mg/dL (ref 0.0–0.3)
Total Bilirubin: 0.9 mg/dL (ref 0.2–1.2)
Total Protein: 6.7 g/dL (ref 6.0–8.3)

## 2013-10-23 LAB — LIPID PANEL
Cholesterol: 115 mg/dL (ref 0–200)
HDL: 45.6 mg/dL (ref >39.00)
LDL Cholesterol: 56 mg/dL (ref 0–99)
Total CHOL/HDL Ratio: 3
Triglycerides: 68 mg/dL (ref 0.0–149.0)
VLDL: 13.6 mg/dL (ref 0.0–40.0)

## 2013-10-23 LAB — CBC WITH DIFFERENTIAL/PLATELET
BASOS ABS: 0 10*3/uL (ref 0.0–0.1)
Basophils Relative: 0.5 % (ref 0.0–3.0)
Eosinophils Absolute: 0.1 10*3/uL (ref 0.0–0.7)
Eosinophils Relative: 1.1 % (ref 0.0–5.0)
HEMATOCRIT: 33.2 % — AB (ref 39.0–52.0)
Hemoglobin: 11.1 g/dL — ABNORMAL LOW (ref 13.0–17.0)
Lymphocytes Relative: 25.8 % (ref 12.0–46.0)
Lymphs Abs: 2.3 10*3/uL (ref 0.7–4.0)
MCHC: 33.6 g/dL (ref 30.0–36.0)
MCV: 91.3 fl (ref 78.0–100.0)
MONOS PCT: 8.2 % (ref 3.0–12.0)
Monocytes Absolute: 0.7 10*3/uL (ref 0.1–1.0)
NEUTROS ABS: 5.9 10*3/uL (ref 1.4–7.7)
Neutrophils Relative %: 64.4 % (ref 43.0–77.0)
Platelets: 202 10*3/uL (ref 150.0–400.0)
RBC: 3.63 Mil/uL — ABNORMAL LOW (ref 4.22–5.81)
RDW: 13.4 % (ref 11.5–15.5)
WBC: 9.1 10*3/uL (ref 4.0–10.5)

## 2013-10-23 LAB — BASIC METABOLIC PANEL
BUN: 31 mg/dL — ABNORMAL HIGH (ref 6–23)
CHLORIDE: 105 meq/L (ref 96–112)
CO2: 28 mEq/L (ref 19–32)
Calcium: 9.4 mg/dL (ref 8.4–10.5)
Creatinine, Ser: 1.3 mg/dL (ref 0.4–1.5)
GFR: 57.51 mL/min — AB (ref >60.00)
GLUCOSE: 90 mg/dL (ref 70–99)
POTASSIUM: 3.7 meq/L (ref 3.5–5.1)
SODIUM: 141 meq/L (ref 135–145)

## 2013-10-23 LAB — HEMOGLOBIN A1C: Hgb A1c MFr Bld: 6.9 % — ABNORMAL HIGH (ref 4.6–6.5)

## 2013-10-23 LAB — MICROALBUMIN / CREATININE URINE RATIO
Creatinine,U: 110.9 mg/dL
MICROALB UR: 2.1 mg/dL — AB (ref 0.0–1.9)
MICROALB/CREAT RATIO: 1.9 mg/g (ref 0.0–30.0)

## 2013-10-23 LAB — HM DIABETES FOOT EXAM

## 2013-10-23 NOTE — Progress Notes (Signed)
Pre visit review using our clinic review tool, if applicable. No additional management support is needed unless otherwise documented below in the visit note. 

## 2013-10-23 NOTE — Progress Notes (Addendum)
CAD- no sxs He is exercising every day He is still working a few hours a day  htn- no sxs (home bps 110/60)  Dm- tolerating metformin. No home cbgs Lab Results  Component Value Date   HGBA1C 6.9* 04/30/2013   Past Medical History  Diagnosis Date  . DM type 2 (diabetes mellitus, type 2) 2002  . Hypertension   . Hyperlipidemia   . Colon polyps 1996    villous adenoma  . CAD (coronary artery disease)     LHC 03/25/11 by Dr. Burt Knack:  pLAD 99%, oCFX 20-30%, pOM1 40%, dAVCFX 20-30%.  EF was normal on nuclear study.  He was treated with a Promus DES to his pLAD.     History   Social History  . Marital Status: Married    Spouse Name: N/A    Number of Children: N/A  . Years of Education: N/A   Occupational History  . Not on file.   Social History Main Topics  . Smoking status: Former Smoker -- 1.00 packs/day    Quit date: 06/07/1958  . Smokeless tobacco: Never Used  . Alcohol Use: No  . Drug Use: No  . Sexual Activity: Not on file   Other Topics Concern  . Not on file   Social History Narrative  . No narrative on file    Past Surgical History  Procedure Laterality Date  . Cataract      Family History  Problem Relation Age of Onset  . Coronary artery disease Father 14    deceased  . Stomach cancer Mother 65    No Known Allergies  Current Outpatient Prescriptions on File Prior to Visit  Medication Sig Dispense Refill  . aspirin 81 MG tablet Take 81 mg by mouth daily.      . felodipine (PLENDIL) 2.5 MG 24 hr tablet Take 1 tablet (2.5 mg total) by mouth daily.  90 tablet  3  . fish oil-omega-3 fatty acids 1000 MG capsule Take 2 g by mouth daily.        . Hypromellose (ARTIFICIAL TEARS OP) Apply to eye.        . losartan-hydrochlorothiazide (HYZAAR) 100-25 MG per tablet TAKE 1 TABLET BY MOUTH EVERY DAY  90 tablet  2  . metFORMIN (GLUCOPHAGE) 1000 MG tablet TAKE 1 TABLET TWICE DAILY  180 tablet  2  . metoprolol tartrate (LOPRESSOR) 25 MG tablet TAKE 1 TABLET (25  MG TOTAL) BY MOUTH 2 (TWO) TIMES DAILY.  60 tablet  2  . Multiple Vitamins-Minerals (ICAPS PO) Take by mouth.        . simvastatin (ZOCOR) 40 MG tablet TAKE 1 TABLET EVERY DAY  90 tablet  2  . tamsulosin (FLOMAX) 0.4 MG CAPS Take 1 capsule (0.4 mg total) by mouth daily.  90 capsule  3   No current facility-administered medications on file prior to visit.     patient denies chest pain, shortness of breath, orthopnea. Denies lower extremity edema, abdominal pain, change in appetite, change in bowel movements. Patient denies rashes, musculoskeletal complaints. No other specific complaints in a complete review of systems.   BP 140/70  Pulse 82  Temp(Src) 97.9 F (36.6 C) (Oral)  Wt 120 lb (54.432 kg)  SpO2 95%  well-developed well-nourished male in no acute distress. HEENT exam atraumatic, normocephalic, neck supple without jugular venous distention. Chest clear to auscultation cardiac exam S1-S2 are regular. Abdominal exam thin, with bowel sounds, soft and nontender. Extremities no edema. Neurologic exam is alert with a  normal gait.   CAD (coronary artery disease) He takes remarkable care of himself Continue same Check labs today  HYPERTENSION Well controlled at home- continue same meds  DIABETES MELLITUS, TYPE II i suspect well controlled i will consider decreasing metformin if a1c is "good"

## 2013-10-23 NOTE — Assessment & Plan Note (Signed)
He takes remarkable care of himself Continue same Check labs today

## 2013-10-23 NOTE — Assessment & Plan Note (Signed)
Well controlled at home- continue same meds

## 2013-10-23 NOTE — Assessment & Plan Note (Signed)
i suspect well controlled i will consider decreasing metformin if a1c is "good"

## 2013-11-01 ENCOUNTER — Other Ambulatory Visit: Payer: Self-pay | Admitting: Internal Medicine

## 2013-11-01 DIAGNOSIS — D649 Anemia, unspecified: Secondary | ICD-10-CM

## 2013-11-23 ENCOUNTER — Ambulatory Visit: Payer: Medicare HMO | Admitting: Hematology and Oncology

## 2013-11-23 ENCOUNTER — Ambulatory Visit: Payer: Medicare HMO

## 2013-12-31 ENCOUNTER — Other Ambulatory Visit: Payer: Self-pay | Admitting: Internal Medicine

## 2014-01-09 ENCOUNTER — Ambulatory Visit (INDEPENDENT_AMBULATORY_CARE_PROVIDER_SITE_OTHER): Payer: Medicare HMO | Admitting: Internal Medicine

## 2014-01-09 ENCOUNTER — Encounter: Payer: Self-pay | Admitting: Internal Medicine

## 2014-01-09 VITALS — BP 141/61 | HR 65 | Ht 64.0 in | Wt 115.0 lb

## 2014-01-09 DIAGNOSIS — I25810 Atherosclerosis of coronary artery bypass graft(s) without angina pectoris: Secondary | ICD-10-CM

## 2014-01-09 NOTE — Progress Notes (Signed)
Patient Care Team: Lisabeth Pick, MD as PCP - General   HPI  [name] is a 80 y.o. male Seen in followup for ischemic heart disease with prior LAD stenting 2012. He has diabetes and hypertension  Myoview 7/14 demonstrated normal LV function and no perfusion defects  The patient denies chest pain, shortness of breath, nocturnal dyspnea, orthopnea or peripheral edema.  There have been no palpitations, lightheadedness or syncope.    Past Medical History  Diagnosis Date  . DM type 2 (diabetes mellitus, type 2) 2002  . Hypertension   . Hyperlipidemia   . Colon polyps 1996    villous adenoma  . CAD (coronary artery disease)     LHC 03/25/11 by Dr. Burt Knack:  pLAD 99%, oCFX 20-30%, pOM1 40%, dAVCFX 20-30%.  EF was normal on nuclear study.  He was treated with a Promus DES to his pLAD.     Past Surgical History  Procedure Laterality Date  . Cataract      Current Outpatient Prescriptions  Medication Sig Dispense Refill  . aspirin 81 MG tablet Take 81 mg by mouth daily.      . felodipine (PLENDIL) 2.5 MG 24 hr tablet Take 1 tablet (2.5 mg total) by mouth daily.  90 tablet  3  . fish oil-omega-3 fatty acids 1000 MG capsule Take 2 g by mouth daily.        . Hypromellose (ARTIFICIAL TEARS OP) Apply to eye.        . losartan-hydrochlorothiazide (HYZAAR) 100-25 MG per tablet TAKE 1 TABLET BY MOUTH EVERY DAY  90 tablet  2  . metFORMIN (GLUCOPHAGE) 1000 MG tablet TAKE 1 TABLET TWICE DAILY  180 tablet  2  . metoprolol tartrate (LOPRESSOR) 25 MG tablet TAKE 1 TABLET (25 MG TOTAL) BY MOUTH 2 (TWO) TIMES DAILY.  60 tablet  0  . Multiple Vitamins-Minerals (ICAPS PO) Take by mouth.        . simvastatin (ZOCOR) 40 MG tablet TAKE 1 TABLET EVERY DAY  90 tablet  2  . tamsulosin (FLOMAX) 0.4 MG CAPS Take 1 capsule (0.4 mg total) by mouth daily.  90 capsule  3   No current facility-administered medications for this visit.    No Known Allergies  Review of Systems negative except from HPI and  PMH  Physical Exam BP 141/61  Pulse 65  Ht 5\' 4"  (1.626 m)  Wt 115 lb (52.164 kg)  BMI 19.73 kg/m2 Well developed and nourished in no acute distress HENT normal Neck supple with JVP-flat Clear Regular rate and rhythm, no murmurs or gallops Abd-soft with active BS No Clubbing cyanosis tr edema Skin-warm and dry A & Oriented  Grossly normal sensory and motor function   ECG demonstrates sinus rhythm at 53 beats per minute  Assessment and  Plan Ischemic Cardiomyopathy   Hypertension   Stable without symptoms   Will continue current meds x stop metoprolol   Will follow BP with PCP

## 2014-01-09 NOTE — Patient Instructions (Addendum)
Your physician has recommended you make the following change in your medication:  1) STOP Metoprolol  Your physician wants you to follow-up in: 1 year with Dr. Caryl Comes.  You will receive a reminder letter in the mail two months in advance. If you don't receive a letter, please call our office to schedule the follow-up appointment.

## 2014-01-16 ENCOUNTER — Other Ambulatory Visit: Payer: Self-pay | Admitting: Internal Medicine

## 2014-01-27 ENCOUNTER — Other Ambulatory Visit: Payer: Self-pay | Admitting: Internal Medicine

## 2014-02-25 ENCOUNTER — Ambulatory Visit (INDEPENDENT_AMBULATORY_CARE_PROVIDER_SITE_OTHER): Payer: Commercial Managed Care - HMO | Admitting: Family Medicine

## 2014-02-25 ENCOUNTER — Encounter: Payer: Self-pay | Admitting: Family Medicine

## 2014-02-25 VITALS — BP 182/62 | HR 63 | Temp 98.1°F | Ht 64.0 in | Wt 124.8 lb

## 2014-02-25 DIAGNOSIS — Z0000 Encounter for general adult medical examination without abnormal findings: Secondary | ICD-10-CM

## 2014-02-25 DIAGNOSIS — E1159 Type 2 diabetes mellitus with other circulatory complications: Secondary | ICD-10-CM

## 2014-02-25 DIAGNOSIS — E785 Hyperlipidemia, unspecified: Secondary | ICD-10-CM

## 2014-02-25 DIAGNOSIS — N400 Enlarged prostate without lower urinary tract symptoms: Secondary | ICD-10-CM

## 2014-02-25 DIAGNOSIS — I10 Essential (primary) hypertension: Secondary | ICD-10-CM

## 2014-02-25 MED ORDER — TAMSULOSIN HCL 0.4 MG PO CAPS: 0.4000 mg | ORAL_CAPSULE | Freq: Every day | ORAL | Status: DC

## 2014-02-25 NOTE — Progress Notes (Signed)
Pre visit review using our clinic review tool, if applicable. No additional management support is needed unless otherwise documented below in the visit note. 

## 2014-02-25 NOTE — Patient Instructions (Signed)
Preventive Care for Adults A healthy lifestyle and preventive care can promote health and wellness. Preventive health guidelines for men include the following key practices:  A routine yearly physical is a good way to check with your health care provider about your health and preventative screening. It is a chance to share any concerns and updates on your health and to receive a thorough exam.  Visit your dentist for a routine exam and preventative care every 6 months. Brush your teeth twice a day and floss once a day. Good oral hygiene prevents tooth decay and gum disease.  The frequency of eye exams is based on your age, health, family medical history, use of contact lenses, and other factors. Follow your health care provider's recommendations for frequency of eye exams.  Eat a healthy diet. Foods such as vegetables, fruits, whole grains, low-fat dairy products, and lean protein foods contain the nutrients you need without too many calories. Decrease your intake of foods high in solid fats, added sugars, and salt. Eat the right amount of calories for you.Get information about a proper diet from your health care provider, if necessary.  Regular physical exercise is one of the most important things you can do for your health. Most adults should get at least 150 minutes of moderate-intensity exercise (any activity that increases your heart rate and causes you to sweat) each week. In addition, most adults need muscle-strengthening exercises on 2 or more days a week.  Maintain a healthy weight. The body mass index (BMI) is a screening tool to identify possible weight problems. It provides an estimate of body fat based on height and weight. Your health care provider can find your BMI and can help you achieve or maintain a healthy weight.For adults 20 years and older:  A BMI below 18.5 is considered underweight.  A BMI of 18.5 to 24.9 is normal.  A BMI of 25 to 29.9 is considered overweight.  A BMI  of 30 and above is considered obese.  Maintain normal blood lipids and cholesterol levels by exercising and minimizing your intake of saturated fat. Eat a balanced diet with plenty of fruit and vegetables. Blood tests for lipids and cholesterol should begin at age 50 and be repeated every 5 years. If your lipid or cholesterol levels are high, you are over 50, or you are at high risk for heart disease, you may need your cholesterol levels checked more frequently.Ongoing high lipid and cholesterol levels should be treated with medicines if diet and exercise are not working.  If you smoke, find out from your health care provider how to quit. If you do not use tobacco, do not start.  Lung cancer screening is recommended for adults aged 73-80 years who are at high risk for developing lung cancer because of a history of smoking. A yearly low-dose CT scan of the lungs is recommended for people who have at least a 30-pack-year history of smoking and are a current smoker or have quit within the past 15 years. A pack year of smoking is smoking an average of 1 pack of cigarettes a day for 1 year (for example: 1 pack a day for 30 years or 2 packs a day for 15 years). Yearly screening should continue until the smoker has stopped smoking for at least 15 years. Yearly screening should be stopped for people who develop a health problem that would prevent them from having lung cancer treatment.  If you choose to drink alcohol, do not have more than  2 drinks per day. One drink is considered to be 12 ounces (355 mL) of beer, 5 ounces (148 mL) of wine, or 1.5 ounces (44 mL) of liquor.  Avoid use of street drugs. Do not share needles with anyone. Ask for help if you need support or instructions about stopping the use of drugs.  High blood pressure causes heart disease and increases the risk of stroke. Your blood pressure should be checked at least every 1-2 years. Ongoing high blood pressure should be treated with  medicines, if weight loss and exercise are not effective.  If you are 45-79 years old, ask your health care provider if you should take aspirin to prevent heart disease.  Diabetes screening involves taking a blood sample to check your fasting blood sugar level. This should be done once every 3 years, after age 45, if you are within normal weight and without risk factors for diabetes. Testing should be considered at a younger age or be carried out more frequently if you are overweight and have at least 1 risk factor for diabetes.  Colorectal cancer can be detected and often prevented. Most routine colorectal cancer screening begins at the age of 50 and continues through age 75. However, your health care provider may recommend screening at an earlier age if you have risk factors for colon cancer. On a yearly basis, your health care provider may provide home test kits to check for hidden blood in the stool. Use of a small camera at the end of a tube to directly examine the colon (sigmoidoscopy or colonoscopy) can detect the earliest forms of colorectal cancer. Talk to your health care provider about this at age 50, when routine screening begins. Direct exam of the colon should be repeated every 5-10 years through age 75, unless early forms of precancerous polyps or small growths are found.  People who are at an increased risk for hepatitis B should be screened for this virus. You are considered at high risk for hepatitis B if:  You were born in a country where hepatitis B occurs often. Talk with your health care provider about which countries are considered high risk.  Your parents were born in a high-risk country and you have not received a shot to protect against hepatitis B (hepatitis B vaccine).  You have HIV or AIDS.  You use needles to inject street drugs.  You live with, or have sex with, someone who has hepatitis B.  You are a man who has sex with other men (MSM).  You get hemodialysis  treatment.  You take certain medicines for conditions such as cancer, organ transplantation, and autoimmune conditions.  Hepatitis C blood testing is recommended for all people born from 1945 through 1965 and any individual with known risks for hepatitis C.  Practice safe sex. Use condoms and avoid high-risk sexual practices to reduce the spread of sexually transmitted infections (STIs). STIs include gonorrhea, chlamydia, syphilis, trichomonas, herpes, HPV, and human immunodeficiency virus (HIV). Herpes, HIV, and HPV are viral illnesses that have no cure. They can result in disability, cancer, and death.  If you are at risk of being infected with HIV, it is recommended that you take a prescription medicine daily to prevent HIV infection. This is called preexposure prophylaxis (PrEP). You are considered at risk if:  You are a man who has sex with other men (MSM) and have other risk factors.  You are a heterosexual man, are sexually active, and are at increased risk for HIV infection.    You take drugs by injection.  You are sexually active with a partner who has HIV.  Talk with your health care provider about whether you are at high risk of being infected with HIV. If you choose to begin PrEP, you should first be tested for HIV. You should then be tested every 3 months for as long as you are taking PrEP.  A one-time screening for abdominal aortic aneurysm (AAA) and surgical repair of large AAAs by ultrasound are recommended for men ages 32 to 67 years who are current or former smokers.  Healthy men should no longer receive prostate-specific antigen (PSA) blood tests as part of routine cancer screening. Talk with your health care provider about prostate cancer screening.  Testicular cancer screening is not recommended for adult males who have no symptoms. Screening includes self-exam, a health care provider exam, and other screening tests. Consult with your health care provider about any symptoms  you have or any concerns you have about testicular cancer.  Use sunscreen. Apply sunscreen liberally and repeatedly throughout the day. You should seek shade when your shadow is shorter than you. Protect yourself by wearing long sleeves, pants, a wide-brimmed hat, and sunglasses year round, whenever you are outdoors.  Once a month, do a whole-body skin exam, using a mirror to look at the skin on your back. Tell your health care provider about new moles, moles that have irregular borders, moles that are larger than a pencil eraser, or moles that have changed in shape or color.  Stay current with required vaccines (immunizations).  Influenza vaccine. All adults should be immunized every year.  Tetanus, diphtheria, and acellular pertussis (Td, Tdap) vaccine. An adult who has not previously received Tdap or who does not know his vaccine status should receive 1 dose of Tdap. This initial dose should be followed by tetanus and diphtheria toxoids (Td) booster doses every 10 years. Adults with an unknown or incomplete history of completing a 3-dose immunization series with Td-containing vaccines should begin or complete a primary immunization series including a Tdap dose. Adults should receive a Td booster every 10 years.  Varicella vaccine. An adult without evidence of immunity to varicella should receive 2 doses or a second dose if he has previously received 1 dose.  Human papillomavirus (HPV) vaccine. Males aged 68-21 years who have not received the vaccine previously should receive the 3-dose series. Males aged 22-26 years may be immunized. Immunization is recommended through the age of 6 years for any male who has sex with males and did not get any or all doses earlier. Immunization is recommended for any person with an immunocompromised condition through the age of 49 years if he did not get any or all doses earlier. During the 3-dose series, the second dose should be obtained 4-8 weeks after the first  dose. The third dose should be obtained 24 weeks after the first dose and 16 weeks after the second dose.  Zoster vaccine. One dose is recommended for adults aged 50 years or older unless certain conditions are present.  Measles, mumps, and rubella (MMR) vaccine. Adults born before [birth year] generally are considered immune to measles and mumps. Adults born in [birth year] or later should have 1 or more doses of MMR vaccine unless there is a contraindication to the vaccine or there is laboratory evidence of immunity to each of the three diseases. A routine second dose of MMR vaccine should be obtained at least 28 days after the first dose for students attending postsecondary  schools, health care workers, or international travelers. People who received inactivated measles vaccine or an unknown type of measles vaccine during 1963-1967 should receive 2 doses of MMR vaccine. People who received inactivated mumps vaccine or an unknown type of mumps vaccine before 1979 and are at high risk for mumps infection should consider immunization with 2 doses of MMR vaccine. Unvaccinated health care workers born before 1957 who lack laboratory evidence of measles, mumps, or rubella immunity or laboratory confirmation of disease should consider measles and mumps immunization with 2 doses of MMR vaccine or rubella immunization with 1 dose of MMR vaccine.  Pneumococcal 13-valent conjugate (PCV13) vaccine. When indicated, a person who is uncertain of his immunization history and has no record of immunization should receive the PCV13 vaccine. An adult aged 19 years or older who has certain medical conditions and has not been previously immunized should receive 1 dose of PCV13 vaccine. This PCV13 should be followed with a dose of pneumococcal polysaccharide (PPSV23) vaccine. The PPSV23 vaccine dose should be obtained at least 8 weeks after the dose of PCV13 vaccine. An adult aged 19 years or older who has certain medical conditions and  previously received 1 or more doses of PPSV23 vaccine should receive 1 dose of PCV13. The PCV13 vaccine dose should be obtained 1 or more years after the last PPSV23 vaccine dose.  Pneumococcal polysaccharide (PPSV23) vaccine. When PCV13 is also indicated, PCV13 should be obtained first. All adults aged 65 years and older should be immunized. An adult younger than age 65 years who has certain medical conditions should be immunized. Any person who resides in a nursing home or long-term care facility should be immunized. An adult smoker should be immunized. People with an immunocompromised condition and certain other conditions should receive both PCV13 and PPSV23 vaccines. People with human immunodeficiency virus (HIV) infection should be immunized as soon as possible after diagnosis. Immunization during chemotherapy or radiation therapy should be avoided. Routine use of PPSV23 vaccine is not recommended for American Indians, [place] Natives, or people younger than 65 years unless there are medical conditions that require PPSV23 vaccine. When indicated, people who have unknown immunization and have no record of immunization should receive PPSV23 vaccine. One-time revaccination 5 years after the first dose of PPSV23 is recommended for people aged 19-64 years who have chronic kidney failure, nephrotic syndrome, asplenia, or immunocompromised conditions. People who received 1-2 doses of PPSV23 before age 65 years should receive another dose of PPSV23 vaccine at age 65 years or later if at least 5 years have passed since the previous dose. Doses of PPSV23 are not needed for people immunized with PPSV23 at or after age 65 years.  Meningococcal vaccine. Adults with asplenia or persistent complement component deficiencies should receive 2 doses of quadrivalent meningococcal conjugate (MenACWY-D) vaccine. The doses should be obtained at least 2 months apart. Microbiologists working with certain meningococcal bacteria,  military recruits, people at risk during an outbreak, and people who travel to or live in countries with a high rate of meningitis should be immunized. A first-year college student up through age 21 years who is living in a residence hall should receive a dose if he did not receive a dose on or after his 16th birthday. Adults who have certain high-risk conditions should receive one or more doses of vaccine.  Hepatitis A vaccine. Adults who wish to be protected from this disease, have certain high-risk conditions, work with hepatitis A-infected animals, work in hepatitis A research labs, or   travel to or work in countries with a high rate of hepatitis A should be immunized. Adults who were previously unvaccinated and who anticipate close contact with an international adoptee during the first 60 days after arrival in the [place] from a country with a high rate of hepatitis A should be immunized.  Hepatitis B vaccine. Adults should be immunized if they wish to be protected from this disease, have certain high-risk conditions, may be exposed to blood or other infectious body fluids, are household contacts or sex partners of hepatitis B positive people, are clients or workers in certain care facilities, or travel to or work in countries with a high rate of hepatitis B.  Haemophilus influenzae type b (Hib) vaccine. A previously unvaccinated person with asplenia or sickle cell disease or having a scheduled splenectomy should receive 1 dose of Hib vaccine. Regardless of previous immunization, a recipient of a hematopoietic stem cell transplant should receive a 3-dose series 6-12 months after his successful transplant. Hib vaccine is not recommended for adults with HIV infection. Preventive Service / Frequency Ages 52 to 17  Blood pressure check.** / Every 1 to 2 years.  Lipid and cholesterol check.** / Every 5 years beginning at age 69.  Hepatitis C blood test.** / For any individual with known risks for  hepatitis C.  Skin self-exam. / Monthly.  Influenza vaccine. / Every year.  Tetanus, diphtheria, and acellular pertussis (Tdap, Td) vaccine.** / Consult your health care provider. 1 dose of Td every 10 years.  Varicella vaccine.** / Consult your health care provider.  HPV vaccine. / 3 doses over 6 months, if 72 or younger.  Measles, mumps, rubella (MMR) vaccine.** / You need at least 1 dose of MMR if you were born in 1957 or later. You may also need a second dose.  Pneumococcal 13-valent conjugate (PCV13) vaccine.** / Consult your health care provider.  Pneumococcal polysaccharide (PPSV23) vaccine.** / 1 to 2 doses if you smoke cigarettes or if you have certain conditions.  Meningococcal vaccine.** / 1 dose if you are age 35 to 60 years and a Market researcher living in a residence hall, or have one of several medical conditions. You may also need additional booster doses.  Hepatitis A vaccine.** / Consult your health care provider.  Hepatitis B vaccine.** / Consult your health care provider.  Haemophilus influenzae type b (Hib) vaccine.** / Consult your health care provider. Ages 35 to 8  Blood pressure check.** / Every 1 to 2 years.  Lipid and cholesterol check.** / Every 5 years beginning at age 57.  Lung cancer screening. / Every year if you are aged 44-80 years and have a 30-pack-year history of smoking and currently smoke or have quit within the past 15 years. Yearly screening is stopped once you have quit smoking for at least 15 years or develop a health problem that would prevent you from having lung cancer treatment.  Fecal occult blood test (FOBT) of stool. / Every year beginning at age 55 and continuing until age 73. You may not have to do this test if you get a colonoscopy every 10 years.  Flexible sigmoidoscopy** or colonoscopy.** / Every 5 years for a flexible sigmoidoscopy or every 10 years for a colonoscopy beginning at age 28 and continuing until age  1.  Hepatitis C blood test.** / For all people born from [birth year] through 1965 and any individual with known risks for hepatitis C.  Skin self-exam. / Monthly.  Influenza vaccine. / Every  year.  Tetanus, diphtheria, and acellular pertussis (Tdap/Td) vaccine.** / Consult your health care provider. 1 dose of Td every 10 years.  Varicella vaccine.** / Consult your health care provider.  Zoster vaccine.** / 1 dose for adults aged 53 years or older.  Measles, mumps, rubella (MMR) vaccine.** / You need at least 1 dose of MMR if you were born in 1957 or later. You may also need a second dose.  Pneumococcal 13-valent conjugate (PCV13) vaccine.** / Consult your health care provider.  Pneumococcal polysaccharide (PPSV23) vaccine.** / 1 to 2 doses if you smoke cigarettes or if you have certain conditions.  Meningococcal vaccine.** / Consult your health care provider.  Hepatitis A vaccine.** / Consult your health care provider.  Hepatitis B vaccine.** / Consult your health care provider.  Haemophilus influenzae type b (Hib) vaccine.** / Consult your health care provider. Ages 77 and over  Blood pressure check.** / Every 1 to 2 years.  Lipid and cholesterol check.**/ Every 5 years beginning at age 85.  Lung cancer screening. / Every year if you are aged 55-80 years and have a 30-pack-year history of smoking and currently smoke or have quit within the past 15 years. Yearly screening is stopped once you have quit smoking for at least 15 years or develop a health problem that would prevent you from having lung cancer treatment.  Fecal occult blood test (FOBT) of stool. / Every year beginning at age 33 and continuing until age 11. You may not have to do this test if you get a colonoscopy every 10 years.  Flexible sigmoidoscopy** or colonoscopy.** / Every 5 years for a flexible sigmoidoscopy or every 10 years for a colonoscopy beginning at age 28 and continuing until age 73.  Hepatitis C blood  test.** / For all people born from [birth year] through 1965 and any individual with known risks for hepatitis C.  Abdominal aortic aneurysm (AAA) screening.** / A one-time screening for ages 50 to 27 years who are current or former smokers.  Skin self-exam. / Monthly.  Influenza vaccine. / Every year.  Tetanus, diphtheria, and acellular pertussis (Tdap/Td) vaccine.** / 1 dose of Td every 10 years.  Varicella vaccine.** / Consult your health care provider.  Zoster vaccine.** / 1 dose for adults aged 34 years or older.  Pneumococcal 13-valent conjugate (PCV13) vaccine.** / Consult your health care provider.  Pneumococcal polysaccharide (PPSV23) vaccine.** / 1 dose for all adults aged 63 years and older.  Meningococcal vaccine.** / Consult your health care provider.  Hepatitis A vaccine.** / Consult your health care provider.  Hepatitis B vaccine.** / Consult your health care provider.  Haemophilus influenzae type b (Hib) vaccine.** / Consult your health care provider. **Family history and personal history of risk and conditions may change your health care provider's recommendations. Document Released: 07/20/2001 Document Revised: 05/29/2013 Document Reviewed: 10/19/2010 [facility] Patient Information 2015 [place], [place]. This information is not intended to replace advice given to you by your health care provider. Make sure you discuss any questions you have with your health care provider.

## 2014-02-25 NOTE — Progress Notes (Signed)
Subjective:    [name] is a 80 y.o. male who presents for Medicare Annual/Subsequent preventive examination.   Preventive Screening-Counseling & Management  Tobacco History  Smoking status  . Former Smoker -- 1.00 packs/day  . Quit date: 06/07/1958  Smokeless tobacco  . Never Used    Problems Prior to Visit 1. none  Current Problems (verified) Patient Active Problem List   Diagnosis Date Noted  . BPH with urinary obstruction 12/13/2012  . CAD (coronary artery disease) 04/09/2011  . GASTRITIS 05/25/2010  . COLONIC POLYPS, ADENOMATOUS, HX OF 04/20/2010  . ANEMIA 10/27/2009  . DIABETES MELLITUS, TYPE II 07/04/2006  . HYPERLIPIDEMIA 07/04/2006  . HYPERTENSION 07/04/2006    Medications Prior to Visit Current Outpatient Prescriptions on File Prior to Visit  Medication Sig Dispense Refill  . aspirin 81 MG tablet Take 81 mg by mouth daily.      . fish oil-omega-3 fatty acids 1000 MG capsule Take 2 g by mouth daily.        [name] losartan-hydrochlorothiazide (HYZAAR) 100-25 MG per tablet TAKE 1 TABLET BY MOUTH EVERY DAY  90 tablet  2  . metFORMIN (GLUCOPHAGE) 1000 MG tablet TAKE 1 TABLET TWICE DAILY  180 tablet  2  . simvastatin (ZOCOR) 40 MG tablet TAKE 1 TABLET EVERY DAY  90 tablet  2   No current facility-administered medications on file prior to visit.    Current Medications (verified) Current Outpatient Prescriptions  Medication Sig Dispense Refill  . aspirin 81 MG tablet Take 81 mg by mouth daily.      . fish oil-omega-3 fatty acids 1000 MG capsule Take 2 g by mouth daily.        [name] losartan-hydrochlorothiazide (HYZAAR) 100-25 MG per tablet TAKE 1 TABLET BY MOUTH EVERY DAY  90 tablet  2  . metFORMIN (GLUCOPHAGE) 1000 MG tablet TAKE 1 TABLET TWICE DAILY  180 tablet  2  . Multiple Vitamin (MULTIVITAMIN) tablet Take 1 tablet by mouth daily.      . simvastatin (ZOCOR) 40 MG tablet TAKE 1 TABLET EVERY DAY  90 tablet  2  . sodium chloride (MURO 128) 5 % ophthalmic ointment  Place 1 application into both eyes at bedtime.       . tamsulosin (FLOMAX) 0.4 MG CAPS capsule Take 1 capsule (0.4 mg total) by mouth daily.  90 capsule  3   No current facility-administered medications for this visit.     Allergies (verified) Review of patient's allergies indicates no known allergies.   PAST HISTORY  Family History Family History  Problem Relation Age of Onset  . Coronary artery disease Father 8    deceased  . Stomach cancer Mother 51    Social History History  Substance Use Topics  . Smoking status: Former Smoker -- 1.00 packs/day    Quit date: 06/07/1958  . Smokeless tobacco: Never Used  . Alcohol Use: No    Are there smokers in your home (other than you)?  No  Risk Factors Current exercise habits: runs 1 mile every day  Dietary issues discussed: na   Cardiac risk factors: advanced age (older than 75 for men, 64 for women), diabetes mellitus, dyslipidemia, hypertension and male gender.  Depression Screen (Note: if answer to either of the following is "Yes", a more complete depression screening is indicated)   Q1: Over the past two weeks, have you felt down, depressed or hopeless? No  Q2: Over the past two weeks, have you felt little interest or pleasure in  doing things? No  Have you lost interest or pleasure in daily life? No  Do you often feel hopeless? No  Do you cry easily over simple problems? No  Activities of Daily Living In your present state of health, do you have any difficulty performing the following activities?:  Driving? No Managing money?  No Feeding yourself? No Getting from bed to chair? No Climbing a flight of stairs? No Preparing food and eating?: No Bathing or showering? No Getting dressed: No Getting to the toilet? No Using the toilet:No Moving around from place to place: No In the past year have you fallen or had a near fall?:No   Are you sexually active?  Yes  Do you have more than one partner?  No  Hearing  Difficulties: No Do you often ask people to speak up or repeat themselves? No Do you experience ringing or noises in your ears? No Do you have difficulty understanding soft or whispered voices? No   Do you feel that you have a problem with memory? No  Do you often misplace items? No  Do you feel safe at home?  Yes  Cognitive Testing  Alert? Yes  Normal Appearance?Yes  Oriented to person? Yes  Place? Yes   Time? Yes  Recall of three objects?  Yes  Can perform simple calculations? Yes  Displays appropriate judgment?Yes  Can read the correct time from a watch face?Yes   Advanced Directives have been discussed with the patient? Yes   List the Names of Other Physician/Practitioners you currently use: 1.  oph- [facility] 2  Dentist--[facility]  3. Cardio-- [facility] any recent Medical Services you may have received from other than [facility] providers in the past year (date may be approximate).  Immunization History  Administered Date(s) Administered  . Influenza Split 03/05/2011, 04/24/2012  . Influenza Whole 02/27/2008, 03/04/2010  . Influenza,inj,quad, With Preservative 03/07/2013  . Pneumococcal Polysaccharide-23 02/27/2008    Screening Tests Health Maintenance  Topic Date Due  . Tetanus/tdap  02/03/1953  . Zostavax  02/03/1994  . Influenza Vaccine  01/05/2014  . Ophthalmology Exam  04/07/2014  . Hemoglobin A1c  04/25/2014  . Foot Exam  10/24/2014  . Colonoscopy  05/26/2020  . Pneumococcal Polysaccharide Vaccine Age 43 And Over  Completed    All answers were reviewed with the patient and necessary referrals were made:  Garnet Koyanagi, DO   02/25/2014   History reviewed:  He  has a past medical history of DM type 2 (diabetes mellitus, type 2) (2002); Hypertension; Hyperlipidemia; Colon polyps (1996); and CAD (coronary artery disease). He  does not have any pertinent problems on file. He  has past surgical history that includes cataract. His family history includes Coronary  artery disease (age of onset: 53) in his father; Stomach cancer (age of onset: 58) in his mother. He  reports that he quit smoking about 55 years ago. He has never used smokeless tobacco. He reports that he does not drink alcohol or use illicit drugs. He has a current medication list which includes the following prescription(s): aspirin, fish oil-omega-3 fatty acids, losartan-hydrochlorothiazide, metformin, multivitamin, simvastatin, sodium chloride, and tamsulosin. Current Outpatient Prescriptions on File Prior to Visit  Medication Sig Dispense Refill  . aspirin 81 MG tablet Take 81 mg by mouth daily.      . fish oil-omega-3 fatty acids 1000 MG capsule Take 2 g by mouth daily.        [name] losartan-hydrochlorothiazide (HYZAAR) 100-25 MG per tablet TAKE 1  TABLET BY MOUTH EVERY DAY  90 tablet  2  . metFORMIN (GLUCOPHAGE) 1000 MG tablet TAKE 1 TABLET TWICE DAILY  180 tablet  2  . simvastatin (ZOCOR) 40 MG tablet TAKE 1 TABLET EVERY DAY  90 tablet  2   No current facility-administered medications on file prior to visit.   He has No Known Allergies.  Review of Systems Review of Systems   Review of Systems  Constitutional: Negative for activity change, appetite change and fatigue.  HENT: Negative for hearing loss, congestion, tinnitus and ear discharge.   Eyes: Negative for visual disturbance (see optho q1y -- vision corrected to 20/20 with glasses).  Respiratory: Negative for cough, chest tightness and shortness of breath.   Cardiovascular: Negative for chest pain, palpitations and leg swelling.  Gastrointestinal: Negative for abdominal pain, diarrhea, constipation and abdominal distention.  Genitourinary: Negative for urgency, frequency, decreased urine volume and difficulty urinating.  Musculoskeletal: Negative for back pain, arthralgias and gait problem.  Skin: Negative for color change, pallor and rash.  Neurological: Negative for dizziness, light-headedness, numbness and headaches.   Hematological: Negative for adenopathy. Does not bruise/bleed easily.  Psychiatric/Behavioral: Negative for suicidal ideas, confusion, sleep disturbance, self-injury, dysphoric mood, decreased concentration and agitation.  Pt is able to read and write and can do all ADLs No risk for falling No abuse/ violence in home    Objective:     Vision by Snellen chart: opth  Blood pressure 182/62, pulse 63, temperature 98.1 F (36.7 C), temperature source Oral, height 5\' 4"  (1.626 m), weight 124 lb 12.5 oz (56.6 kg), SpO2 98.00%. Body mass index is 21.41 kg/(m^2).  BP 182/62  Pulse 63  Temp(Src) 98.1 F (36.7 C) (Oral)  Ht 5\' 4"  (1.626 m)  Wt 124 lb 12.5 oz (56.6 kg)  BMI 21.41 kg/m2  SpO2 98% General appearance: alert, cooperative, appears stated age and no distress Head: Normocephalic, without obvious abnormality, atraumatic Eyes: conjunctivae/corneas clear. PERRL, EOM's intact. Fundi benign. Ears: normal TM's and external ear canals both ears Nose: Nares normal. Septum midline. Mucosa normal. No drainage or sinus tenderness. Throat: lips, mucosa, and tongue normal; teeth and gums normal Neck: no adenopathy, no carotid bruit, no JVD, supple, symmetrical, trachea midline and thyroid not enlarged, symmetric, no tenderness/mass/nodules Back: symmetric, no curvature. ROM normal. No CVA tenderness. Lungs: clear to auscultation bilaterally Chest wall: no tenderness Heart: regular rate and rhythm, S1, S2 normal, no murmur, click, rub or gallop Abdomen: soft, non-tender; bowel sounds normal; no masses,  no organomegaly Male genitalia: normal, penis: no lesions or discharge. testes: no masses or tenderness. no hernias Rectal: normal tone, normal prostate, no masses or tenderness and soft brown guaiac negative stool noted Extremities: extremities normal, atraumatic, no cyanosis or edema Pulses: 2+ and symmetric Skin: Skin color, texture, turgor normal. No rashes or lesions Lymph nodes:  Cervical, supraclavicular, and axillary nodes normal. Neurologic: Alert and oriented X 3, normal strength and tone. Normal symmetric reflexes. Normal coordination and gait Psych- no depression, no anxiety      Assessment:     cpe      Plan:     During the course of the visit the patient was educated and counseled about appropriate screening and preventive services including:    Pneumococcal vaccine   Influenza vaccine  Td vaccine  Prostate cancer screening  Colorectal cancer screening  Diabetes screening  Glaucoma screening  Advanced directives: has an advanced directive - a copy HAS NOT been provided.  Diet review for nutrition  referral? Yes ____  Not Indicated ___x_   Patient Instructions (the written plan) was given to the patient.  Medicare Attestation I have personally reviewed: The patient's medical and social history Their use of alcohol, tobacco or illicit drugs Their current medications and supplements The patient's functional ability including ADLs,fall risks, home safety risks, cognitive, and hearing and visual impairment Diet and physical activities Evidence for depression or mood disorders  The patient's weight, height, BMI, and visual acuity have been recorded in the chart.  I have made referrals, counseling, and provided education to the patient based on review of the above and I have provided the patient with a written personalized care plan for preventive services.    1. Type II or unspecified type diabetes mellitus with peripheral circulatory disorders, uncontrolled(250.72) Check labs, con't meds - Basic metabolic panel; Future - Hemoglobin A1c; Future - POCT urinalysis dipstick; Future  2. Essential hypertension Stable, con' t meds - Basic metabolic panel; Future - CBC with Differential; Future - POCT urinalysis dipstick; Future  3. Other and unspecified hyperlipidemia con't meds - Hepatic function panel; Future - Lipid panel;  Future - POCT urinalysis dipstick; Future  4. BPH (benign prostatic hypertrophy)   - PSA; Future  5. Medicare annual wellness visit, subsequent     Garnet Koyanagi, DO   02/25/2014

## 2014-02-28 ENCOUNTER — Other Ambulatory Visit (INDEPENDENT_AMBULATORY_CARE_PROVIDER_SITE_OTHER): Payer: Commercial Managed Care - HMO

## 2014-02-28 DIAGNOSIS — E785 Hyperlipidemia, unspecified: Secondary | ICD-10-CM

## 2014-02-28 DIAGNOSIS — N400 Enlarged prostate without lower urinary tract symptoms: Secondary | ICD-10-CM

## 2014-02-28 DIAGNOSIS — E1159 Type 2 diabetes mellitus with other circulatory complications: Secondary | ICD-10-CM

## 2014-02-28 DIAGNOSIS — I10 Essential (primary) hypertension: Secondary | ICD-10-CM

## 2014-02-28 LAB — CBC WITH DIFFERENTIAL/PLATELET
BASOS ABS: 0.1 10*3/uL (ref 0.0–0.1)
BASOS PCT: 0.6 % (ref 0.0–3.0)
EOS ABS: 0.1 10*3/uL (ref 0.0–0.7)
Eosinophils Relative: 1.4 % (ref 0.0–5.0)
HCT: 33.3 % — ABNORMAL LOW (ref 39.0–52.0)
Hemoglobin: 11.1 g/dL — ABNORMAL LOW (ref 13.0–17.0)
LYMPHS PCT: 25.4 % (ref 12.0–46.0)
Lymphs Abs: 2.3 10*3/uL (ref 0.7–4.0)
MCHC: 33.2 g/dL (ref 30.0–36.0)
MCV: 90.9 fl (ref 78.0–100.0)
MONO ABS: 0.8 10*3/uL (ref 0.1–1.0)
Monocytes Relative: 8.8 % (ref 3.0–12.0)
NEUTROS PCT: 63.8 % (ref 43.0–77.0)
Neutro Abs: 5.7 10*3/uL (ref 1.4–7.7)
PLATELETS: 235 10*3/uL (ref 150.0–400.0)
RBC: 3.66 Mil/uL — AB (ref 4.22–5.81)
RDW: 13.7 % (ref 11.5–15.5)
WBC: 9 10*3/uL (ref 4.0–10.5)

## 2014-02-28 LAB — HEPATIC FUNCTION PANEL
ALK PHOS: 84 U/L (ref 39–117)
ALT: 21 U/L (ref 0–53)
AST: 22 U/L (ref 0–37)
Albumin: 3.9 g/dL (ref 3.5–5.2)
BILIRUBIN DIRECT: 0.1 mg/dL (ref 0.0–0.3)
Total Bilirubin: 0.7 mg/dL (ref 0.2–1.2)
Total Protein: 6.8 g/dL (ref 6.0–8.3)

## 2014-02-28 LAB — LIPID PANEL
CHOLESTEROL: 165 mg/dL (ref 0–200)
HDL: 64.3 mg/dL (ref >39.00)
LDL Cholesterol: 83 mg/dL (ref 0–99)
NonHDL: 100.7
Total CHOL/HDL Ratio: 3
Triglycerides: 87 mg/dL (ref 0.0–149.0)
VLDL: 17.4 mg/dL (ref 0.0–40.0)

## 2014-02-28 LAB — POCT URINALYSIS DIPSTICK
BILIRUBIN UA: NEGATIVE
Glucose, UA: NEGATIVE
Ketones, UA: NEGATIVE
Leukocytes, UA: NEGATIVE
NITRITE UA: NEGATIVE
RBC UA: NEGATIVE
Spec Grav, UA: 1.02
Urobilinogen, UA: 2
pH, UA: 6.5

## 2014-02-28 LAB — BASIC METABOLIC PANEL
BUN: 27 mg/dL — ABNORMAL HIGH (ref 6–23)
CHLORIDE: 102 meq/L (ref 96–112)
CO2: 32 mEq/L (ref 19–32)
Calcium: 9.5 mg/dL (ref 8.4–10.5)
Creatinine, Ser: 1.3 mg/dL (ref 0.4–1.5)
GFR: 55.46 mL/min — AB (ref >60.00)
Glucose, Bld: 118 mg/dL — ABNORMAL HIGH (ref 70–99)
Potassium: 4.7 mEq/L (ref 3.5–5.1)
SODIUM: 139 meq/L (ref 135–145)

## 2014-02-28 LAB — PSA: PSA: 3.74 ng/mL (ref 0.10–4.00)

## 2014-02-28 LAB — HEMOGLOBIN A1C: Hgb A1c MFr Bld: 6.9 % — ABNORMAL HIGH (ref 4.6–6.5)

## 2014-03-27 ENCOUNTER — Other Ambulatory Visit: Payer: Self-pay

## 2014-03-27 MED ORDER — LOSARTAN POTASSIUM-HCTZ 100-25 MG PO TABS: ORAL_TABLET | ORAL | Status: DC

## 2014-06-07 ENCOUNTER — Other Ambulatory Visit: Payer: Self-pay | Admitting: Internal Medicine

## 2014-08-06 DIAGNOSIS — H1851 Endothelial corneal dystrophy: Secondary | ICD-10-CM | POA: Diagnosis not present

## 2014-08-06 DIAGNOSIS — H4011X1 Primary open-angle glaucoma, mild stage: Secondary | ICD-10-CM | POA: Diagnosis not present

## 2014-08-06 LAB — HM DIABETES EYE EXAM

## 2014-08-21 DIAGNOSIS — H40052 Ocular hypertension, left eye: Secondary | ICD-10-CM | POA: Diagnosis not present

## 2014-08-21 DIAGNOSIS — H4011X1 Primary open-angle glaucoma, mild stage: Secondary | ICD-10-CM | POA: Diagnosis not present

## 2014-08-26 ENCOUNTER — Ambulatory Visit (INDEPENDENT_AMBULATORY_CARE_PROVIDER_SITE_OTHER): Payer: Commercial Managed Care - HMO | Admitting: Family Medicine

## 2014-08-26 ENCOUNTER — Encounter: Payer: Self-pay | Admitting: Family Medicine

## 2014-08-26 DIAGNOSIS — E785 Hyperlipidemia, unspecified: Secondary | ICD-10-CM

## 2014-08-26 DIAGNOSIS — E119 Type 2 diabetes mellitus without complications: Secondary | ICD-10-CM | POA: Diagnosis not present

## 2014-08-26 DIAGNOSIS — I10 Essential (primary) hypertension: Secondary | ICD-10-CM | POA: Diagnosis not present

## 2014-08-26 DIAGNOSIS — Z23 Encounter for immunization: Secondary | ICD-10-CM | POA: Diagnosis not present

## 2014-08-26 DIAGNOSIS — N400 Enlarged prostate without lower urinary tract symptoms: Secondary | ICD-10-CM | POA: Diagnosis not present

## 2014-08-26 LAB — POCT URINALYSIS DIPSTICK
Bilirubin, UA: NEGATIVE
Blood, UA: NEGATIVE
Glucose, UA: NEGATIVE
LEUKOCYTES UA: NEGATIVE
Nitrite, UA: NEGATIVE
SPEC GRAV UA: 1.02
UROBILINOGEN UA: 0.2
pH, UA: 6.5

## 2014-08-26 LAB — HEPATIC FUNCTION PANEL
ALT: 13 U/L (ref 0–53)
AST: 17 U/L (ref 0–37)
Albumin: 4.1 g/dL (ref 3.5–5.2)
Alkaline Phosphatase: 68 U/L (ref 39–117)
BILIRUBIN DIRECT: 0.2 mg/dL (ref 0.0–0.3)
BILIRUBIN TOTAL: 0.7 mg/dL (ref 0.2–1.2)
Total Protein: 7.1 g/dL (ref 6.0–8.3)

## 2014-08-26 LAB — BASIC METABOLIC PANEL
BUN: 20 mg/dL (ref 6–23)
CALCIUM: 9.6 mg/dL (ref 8.4–10.5)
CO2: 30 meq/L (ref 19–32)
Chloride: 103 mEq/L (ref 96–112)
Creatinine, Ser: 1.18 mg/dL (ref 0.40–1.50)
GFR: 63.04 mL/min (ref >60.00)
GLUCOSE: 98 mg/dL (ref 70–99)
Potassium: 3.9 mEq/L (ref 3.5–5.1)
Sodium: 140 mEq/L (ref 135–145)

## 2014-08-26 LAB — MICROALBUMIN / CREATININE URINE RATIO
CREATININE, U: 125.4 mg/dL
MICROALB/CREAT RATIO: 6.6 mg/g (ref 0.0–30.0)
Microalb, Ur: 8.3 mg/dL — ABNORMAL HIGH (ref 0.0–1.9)

## 2014-08-26 LAB — LIPID PANEL
Cholesterol: 128 mg/dL (ref 0–200)
HDL: 48.8 mg/dL (ref >39.00)
LDL Cholesterol: 63 mg/dL (ref 0–99)
NONHDL: 79.2
Total CHOL/HDL Ratio: 3
Triglycerides: 82 mg/dL (ref 0.0–149.0)
VLDL: 16.4 mg/dL (ref 0.0–40.0)

## 2014-08-26 LAB — HEMOGLOBIN A1C: Hgb A1c MFr Bld: 7.4 % — ABNORMAL HIGH (ref 4.6–6.5)

## 2014-08-26 NOTE — Assessment & Plan Note (Signed)
con't metformin Check labs

## 2014-08-26 NOTE — Assessment & Plan Note (Signed)
con't losartan  

## 2014-08-26 NOTE — Progress Notes (Signed)
Pre visit review using our clinic review tool, if applicable. No additional management support is needed unless otherwise documented below in the visit note. 

## 2014-08-26 NOTE — Assessment & Plan Note (Signed)
con't simvastatin Check labs  

## 2014-08-26 NOTE — Progress Notes (Signed)
Patient ID: [name], male    DOB: Apr 23, 1932  Age: 81 y.o. MRN: [id number]    Subjective:  Subjective HPI [name] presents for f/u htn, cholesterol and dm.    HPI HYPERTENSION  Blood pressure range-good  Chest pain- no      Dyspnea- no Lightheadedness- no   Edema- no Other side effects - no   Medication compliance: good Low salt diet- yes  DIABETES  Blood Sugar ranges-not checking   Polyuria- no New Visual problems- no Hypoglycemic symptoms- no Other side effects-no Medication compliance - good Last eye exam- 08/2014 Foot exam- today  HYPERLIPIDEMIA  Medication compliance- good RUQ pain- no  Muscle aches- no Other side effects-no     Review of Systems  Constitutional: Negative for diaphoresis, appetite change, fatigue and unexpected weight change.  Eyes: Negative for pain, redness and visual disturbance.  Respiratory: Negative for cough, chest tightness, shortness of breath and wheezing.   Cardiovascular: Negative for chest pain, palpitations and leg swelling.  Endocrine: Negative for cold intolerance, heat intolerance, polydipsia, polyphagia and polyuria.  Genitourinary: Negative for dysuria, frequency and difficulty urinating.  Neurological: Negative for dizziness, light-headedness, numbness and headaches.  Psychiatric/Behavioral: Negative for confusion, dysphoric mood, decreased concentration and agitation. The patient is not nervous/anxious.     History Past Medical History  Diagnosis Date  . DM type 2 (diabetes mellitus, type 2) 2002  . Hypertension   . Hyperlipidemia   . Colon polyps 1996    villous adenoma  . CAD (coronary artery disease)     LHC 03/25/11 by Dr. Burt Knack:  pLAD 99%, oCFX 20-30%, pOM1 40%, dAVCFX 20-30%.  EF was normal on nuclear study.  He was treated with a Promus DES to his pLAD.     He has past surgical history that includes cataract.   His family history includes Coronary artery disease (age of onset: 11) in his father;  Stomach cancer (age of onset: 50) in his mother.He reports that he quit smoking about 56 years ago. He has never used smokeless tobacco. He reports that he does not drink alcohol or use illicit drugs.  Current Outpatient Prescriptions on File Prior to Visit  Medication Sig Dispense Refill  . aspirin 81 MG tablet Take 81 mg by mouth daily.    . fish oil-omega-3 fatty acids 1000 MG capsule Take 2 g by mouth daily.      [name] losartan-hydrochlorothiazide (HYZAAR) 100-25 MG per tablet TAKE 1 TABLET BY MOUTH EVERY DAY 90 tablet 1  . metFORMIN (GLUCOPHAGE) 1000 MG tablet TAKE 1 TABLET TWICE DAILY 180 tablet 1  . Multiple Vitamin (MULTIVITAMIN) tablet Take 1 tablet by mouth daily.    . simvastatin (ZOCOR) 40 MG tablet TAKE 1 TABLET EVERY DAY 90 tablet 2  . sodium chloride (MURO 128) 5 % ophthalmic ointment Place 1 application into both eyes at bedtime.     . tamsulosin (FLOMAX) 0.4 MG CAPS capsule Take 1 capsule (0.4 mg total) by mouth daily. 90 capsule 3   No current facility-administered medications on file prior to visit.     Objective:  Objective Physical Exam  Constitutional: He is oriented to person, place, and time. Vital signs are normal. He appears well-developed and well-nourished. He is sleeping.  HENT:  Head: Normocephalic and atraumatic.  Mouth/Throat: Oropharynx is clear and moist.  Eyes: EOM are normal. Pupils are equal, round, and reactive to light.  Neck: Normal range of motion. Neck supple. No thyromegaly present.  Cardiovascular: Normal rate and  regular rhythm.   Murmur heard. Pulmonary/Chest: Effort normal and breath sounds normal. No respiratory distress. He has no wheezes. He has no rales. He exhibits no tenderness.  Musculoskeletal: He exhibits no edema or tenderness.       Right ankle: Normal.       Left ankle: Normal.       Right foot: Normal.       Left foot: Normal.       Feet:  Neurological: He is alert and oriented to person, place, and time.  Skin: Skin is warm  and dry.  Psychiatric: He has a normal mood and affect. His behavior is normal. Judgment and thought content normal.  Sensory exam of the foot is normal, tested with the monofilament. Good pulses, no lesions or ulcers, good peripheral pulses.  BP 124/78 mmHg  Pulse 69  Temp(Src) 97.8 F (36.6 C) (Oral)  Wt 122 lb (55.339 kg)  SpO2 97% Wt Readings from Last 3 Encounters:  08/26/14 122 lb (55.339 kg)  02/25/14 124 lb 12.5 oz (56.6 kg)  01/09/14 115 lb (52.164 kg)     Lab Results  Component Value Date   WBC 9.0 02/28/2014   HGB 11.1* 02/28/2014   HCT 33.3* 02/28/2014   PLT 235.0 02/28/2014   GLUCOSE 118* 02/28/2014   CHOL 165 02/28/2014   TRIG 87.0 02/28/2014   HDL 64.30 02/28/2014   LDLCALC 83 02/28/2014   ALT 21 02/28/2014   AST 22 02/28/2014   NA 139 02/28/2014   K 4.7 02/28/2014   CL 102 02/28/2014   CREATININE 1.3 02/28/2014   BUN 27* 02/28/2014   CO2 32 02/28/2014   TSH 2.47 10/20/2009   PSA 3.74 02/28/2014   INR 0.97 03/25/2011   HGBA1C 6.9* 02/28/2014   MICROALBUR 2.1* 10/23/2013    No results found.   Assessment & Plan:  Plan I am having [name] maintain his fish oil-omega-3 fatty acids, aspirin, simvastatin, sodium chloride, multivitamin, tamsulosin, losartan-hydrochlorothiazide, and metFORMIN.  No orders of the defined types were placed in this encounter.    Problem List Items Addressed This Visit    Hyperlipidemia    con't simvastatin Check labs      Relevant Orders   Hepatic function panel   Lipid panel   Microalbumin / creatinine urine ratio   POCT urinalysis dipstick (Completed)   Essential hypertension    con't losartan      Relevant Orders   Basic metabolic panel   DM II (diabetes mellitus, type II), controlled    con't metformin Check labs      Relevant Orders   Basic metabolic panel   Hemoglobin A1c    Other Visit Diagnoses    BPH (benign prostatic hypertrophy)        Need for pneumococcal vaccination        Relevant  Orders    Pneumococcal conjugate vaccine 13-valent (Completed)       Follow-up: Return in about 6 months (around 02/26/2015), or if symptoms worsen or fail to improve.  Garnet Koyanagi, DO

## 2014-08-26 NOTE — Patient Instructions (Signed)
Diabetes and Sick Day Management Blood sugar (glucose) can be more difficult to control when you are sick. Colds, fever, flu, nausea, vomiting, and diarrhea are all examples of common illnesses that can cause problems for people with diabetes. Loss of body fluids (dehydration) from fever, vomiting, diarrhea, infection, and the stress of a sickness can all cause blood glucose levels to increase. Because of this, it is very important to take your diabetes medicines and to eat some form of carbohydrate food when you are sick. Liquid or soft foods are often tolerated, and they help to replace fluids. HOME CARE INSTRUCTIONS These main guidelines are intended for managing a short-term (24 hours or less) sickness:  Take your usual dose of insulin or oral diabetes medicine. An exception would be if you take any form of metformin. If you cannot eat or drink, you can become dehydrated and should not take this medicine.  Continue to take your insulin even if you are unable to eat solid foods or are vomiting. Your insulin dose may stay the same, or it may need to be increased when you are sick.  You will need to test your blood glucose more often, generally every 2-4 hours. If you have type 1 diabetes, test your urine for ketones every 4 hours. If you have type 2 diabetes, test your urine for ketones as directed by your health care provider.  Eat some form of food that contains carbohydrates. The carbohydrates can be in solid or liquid form. You should eat 45-50 g of carbohydrates every 3-4 hours.  Replace fluids if you have a fever, vomit, or have diarrhea. Ask your health care provider for specific rehydration instructions.  Watch carefully for the signs of ketoacidosis if you have type 1 diabetes. Call your health care provider if any of the following symptoms are present, especially in children:  Moderate to large ketones in the urine along with a high blood glucose level.  Severe  nausea.  Vomiting.  Diarrhea.  Abdominal pain.  Rapid breathing.  Drink extra liquids that do not contain sugar such as water.  Be careful with over-the-counter medicines. Read the labels. They may contain sugar or types of sugars that can increase your blood glucose level. Food Choices for Illness All of the food choices below contain about 15 g of carbohydrates. Plan ahead and keep some of these foods around.    to  cup carbonated beverage containing sugar. Carbonated beverages will usually be better tolerated if they are opened and left at room temperature for a few minutes.   of a twin frozen ice pop.   cup regular gelatin.   cup juice.   cup ice cream or frozen yogurt.   cup cooked cereal.   cup sherbet.  1 cup clear broth or soup.  1 cup cream soup.   cup regular custard.   cup regular pudding.  1 cup sports drink.  1 cup plain yogurt.  1 slice toast.  6 squares saltine crackers.  5 vanilla wafers. SEEK MEDICAL CARE IF:   You are unable to drink fluids, even small amounts.  You have nausea and vomiting for more than 6 hours.  You have diarrhea for more than 6 hours.  Your blood glucose level is more than 240 mg/dL, even with additional insulin.  There is a change in mental status.  You develop an additional serious sickness.  You have been sick for 2 days and are not getting better.  You have a fever. SEEK IMMEDIATE  MEDICAL CARE IF:  You have difficulty breathing.  You have moderate to large ketone levels. MAKE SURE YOU:  Understand these instructions.  Will watch your condition.  Will get help right away if you are not doing well or get worse. Document Released: 05/27/2003 Document Revised: 10/08/2013 Document Reviewed: 10/31/2012 [facility] Patient Information 2015 [place], [place]. This information is not intended to replace advice given to you by your health care provider. Make sure you discuss any questions you have with  your health care provider.

## 2014-08-26 NOTE — Assessment & Plan Note (Signed)
con't flomax-- it is working well for pt

## 2014-09-18 ENCOUNTER — Other Ambulatory Visit: Payer: Self-pay

## 2014-09-18 MED ORDER — LOSARTAN POTASSIUM-HCTZ 100-25 MG PO TABS: ORAL_TABLET | ORAL | Status: DC

## 2014-10-01 ENCOUNTER — Encounter: Payer: Self-pay | Admitting: Family Medicine

## 2014-10-09 ENCOUNTER — Other Ambulatory Visit: Payer: Self-pay | Admitting: Internal Medicine

## 2014-10-09 NOTE — Telephone Encounter (Signed)
Not our patient. I think this was routed to wrong person.

## 2015-01-22 ENCOUNTER — Other Ambulatory Visit: Payer: Self-pay | Admitting: Family Medicine

## 2015-02-04 ENCOUNTER — Ambulatory Visit (INDEPENDENT_AMBULATORY_CARE_PROVIDER_SITE_OTHER): Payer: Commercial Managed Care - HMO | Admitting: Family Medicine

## 2015-02-04 ENCOUNTER — Encounter: Payer: Self-pay | Admitting: Family Medicine

## 2015-02-04 ENCOUNTER — Ambulatory Visit: Payer: Self-pay | Admitting: Internal Medicine

## 2015-02-04 ENCOUNTER — Telehealth: Payer: Self-pay

## 2015-02-04 ENCOUNTER — Telehealth: Payer: Self-pay | Admitting: Family Medicine

## 2015-02-04 VITALS — BP 186/78 | HR 72 | Temp 97.7°F | Wt 130.4 lb

## 2015-02-04 DIAGNOSIS — F430 Acute stress reaction: Secondary | ICD-10-CM

## 2015-02-04 DIAGNOSIS — I10 Essential (primary) hypertension: Secondary | ICD-10-CM

## 2015-02-04 DIAGNOSIS — F4321 Adjustment disorder with depressed mood: Secondary | ICD-10-CM

## 2015-02-04 DIAGNOSIS — E785 Hyperlipidemia, unspecified: Secondary | ICD-10-CM | POA: Diagnosis not present

## 2015-02-04 DIAGNOSIS — E119 Type 2 diabetes mellitus without complications: Secondary | ICD-10-CM

## 2015-02-04 DIAGNOSIS — E1165 Type 2 diabetes mellitus with hyperglycemia: Secondary | ICD-10-CM | POA: Diagnosis not present

## 2015-02-04 DIAGNOSIS — IMO0002 Reserved for concepts with insufficient information to code with codable children: Secondary | ICD-10-CM

## 2015-02-04 DIAGNOSIS — F4320 Adjustment disorder, unspecified: Secondary | ICD-10-CM | POA: Insufficient documentation

## 2015-02-04 DIAGNOSIS — N138 Other obstructive and reflux uropathy: Secondary | ICD-10-CM

## 2015-02-04 DIAGNOSIS — N401 Enlarged prostate with lower urinary tract symptoms: Secondary | ICD-10-CM

## 2015-02-04 LAB — BASIC METABOLIC PANEL
BUN: 24 mg/dL — ABNORMAL HIGH (ref 6–23)
CALCIUM: 9.2 mg/dL (ref 8.4–10.5)
CO2: 34 meq/L — AB (ref 19–32)
CREATININE: 1.08 mg/dL (ref 0.40–1.50)
Chloride: 98 mEq/L (ref 96–112)
GFR: 69.75 mL/min (ref >60.00)
Glucose, Bld: 255 mg/dL — ABNORMAL HIGH (ref 70–99)
Potassium: 3.9 mEq/L (ref 3.5–5.1)
SODIUM: 137 meq/L (ref 135–145)

## 2015-02-04 LAB — HEPATIC FUNCTION PANEL
ALT: 16 U/L (ref 0–53)
AST: 15 U/L (ref 0–37)
Albumin: 3.9 g/dL (ref 3.5–5.2)
Alkaline Phosphatase: 85 U/L (ref 39–117)
BILIRUBIN DIRECT: 0.1 mg/dL (ref 0.0–0.3)
Total Bilirubin: 0.5 mg/dL (ref 0.2–1.2)
Total Protein: 6.8 g/dL (ref 6.0–8.3)

## 2015-02-04 LAB — LIPID PANEL
CHOLESTEROL: 133 mg/dL (ref 0–200)
HDL: 46 mg/dL (ref >39.00)
LDL Cholesterol: 68 mg/dL (ref 0–99)
NonHDL: 87.11
Total CHOL/HDL Ratio: 3
Triglycerides: 96 mg/dL (ref 0.0–149.0)
VLDL: 19.2 mg/dL (ref 0.0–40.0)

## 2015-02-04 LAB — MICROALBUMIN / CREATININE URINE RATIO
CREATININE, U: 62.5 mg/dL
Microalb Creat Ratio: 41.4 mg/g — ABNORMAL HIGH (ref 0.0–30.0)
Microalb, Ur: 25.9 mg/dL — ABNORMAL HIGH (ref 0.0–1.9)

## 2015-02-04 LAB — HEMOGLOBIN A1C: HEMOGLOBIN A1C: 8 % — AB (ref 4.6–6.5)

## 2015-02-04 MED ORDER — ATORVASTATIN CALCIUM 10 MG PO TABS: 10.0000 mg | ORAL_TABLET | Freq: Every day | ORAL | Status: DC

## 2015-02-04 MED ORDER — AMLODIPINE BESYLATE 5 MG PO TABS: 5.0000 mg | ORAL_TABLET | Freq: Every day | ORAL | Status: DC

## 2015-02-04 NOTE — Telephone Encounter (Signed)
Msg left to call the office     KP 

## 2015-02-04 NOTE — Telephone Encounter (Signed)
Pt came in for appointment with Dr. Etter Sjogren.

## 2015-02-04 NOTE — Telephone Encounter (Signed)
Patient Name: [name] DOB: 10-Nov-1931 Initial Comment caller states his wife passed away this am - his blood pressure is 224/74 Nurse Assessment Nurse: Vallery Sa, RN, Cathy Date/Time (Eastern Time): 02/04/2015 8:04:26 AM Confirm and document reason for call. If symptomatic, describe symptoms. ---Caller states his blood pressure was 224/74 this morning. Alert and responsive. His wife passed away this morning. Has the patient traveled out of the country within the last 30 days? ---No Does the patient require triage? ---Yes Related visit to physician within the last 2 weeks? ---No Does the PT have any chronic conditions? (i.e. diabetes, asthma, etc.) ---Yes List chronic conditions. ---High Blood Pressure, Diabetes Guidelines Guideline Title Affirmed Question Affirmed Notes High Blood Pressure [1] BP # 200/120 AND [2] having NO cardiac or neurologic symptoms Final Disposition User See Physician within 4 Hours (or PCP triage) Vallery Sa, RN, Cathy Comments Scheduled appointment with Dr. Larose Kells at 9:30am today. Encouraged to call back as needed. Referrals REFERRED TO PCP OFFICE Disagree/Comply: Comply

## 2015-02-04 NOTE — Assessment & Plan Note (Signed)
con't losartan/ hct Add norvasc 5 mg rto 1 month or sooner prn

## 2015-02-04 NOTE — Assessment & Plan Note (Signed)
Pt seems to be handling his wifes death well.  The hospice counselor is coming today He has friends and family surrounding him at home He is sleeping well and will let us know if he needs anything

## 2015-02-04 NOTE — Progress Notes (Signed)
Pre visit review using our clinic review tool, if applicable. No additional management support is needed unless otherwise documented below in the visit note. 

## 2015-02-04 NOTE — Telephone Encounter (Signed)
Pt is returning your call. Please call back.

## 2015-02-04 NOTE — Telephone Encounter (Signed)
yes

## 2015-02-04 NOTE — Telephone Encounter (Signed)
Detailed message left advising he can start the medication today.     KP

## 2015-02-04 NOTE — Telephone Encounter (Signed)
The patient wanted to know if he should start his new medication today. Please advise     KP  C/b # [id number]

## 2015-02-04 NOTE — Progress Notes (Signed)
Patient ID: [name], male    DOB: Dec 02, 1931  Age: 81 y.o. MRN: [id number]    Subjective:  Subjective HPI [name] presents for bp f/u.  It has been running high.  No cp, no sob, no palpitations.   His wife died at home with hospice this am.  He has been under  A lot of stress because of this.    Review of Systems  Constitutional: Negative for diaphoresis, appetite change, fatigue and unexpected weight change.  Eyes: Negative for pain, redness and visual disturbance.  Respiratory: Negative for cough, chest tightness, shortness of breath and wheezing.   Cardiovascular: Negative for chest pain, palpitations and leg swelling.  Endocrine: Negative for cold intolerance, heat intolerance, polydipsia, polyphagia and polyuria.  Genitourinary: Negative for dysuria, frequency and difficulty urinating.  Neurological: Negative for dizziness, light-headedness, numbness and headaches.    History Past Medical History  Diagnosis Date  . DM type 2 (diabetes mellitus, type 2) 2002  . Hypertension   . Hyperlipidemia   . Colon polyps 1996    villous adenoma  . CAD (coronary artery disease)     LHC 03/25/11 by Dr. Burt Knack:  pLAD 99%, oCFX 20-30%, pOM1 40%, dAVCFX 20-30%.  EF was normal on nuclear study.  He was treated with a Promus DES to his pLAD.     He has past surgical history that includes cataract.   His family history includes Coronary artery disease (age of onset: 55) in his father; Stomach cancer (age of onset: 43) in his mother.He reports that he quit smoking about 56 years ago. He has never used smokeless tobacco. He reports that he does not drink alcohol or use illicit drugs.  Current Outpatient Prescriptions on File Prior to Visit  Medication Sig Dispense Refill  . aspirin 81 MG tablet Take 81 mg by mouth daily.    . fish oil-omega-3 fatty acids 1000 MG capsule Take 2 g by mouth daily.      [name] losartan-hydrochlorothiazide (HYZAAR) 100-25 MG per tablet TAKE 1 TABLET BY MOUTH EVERY  DAY 90 tablet 3  . metFORMIN (GLUCOPHAGE) 1000 MG tablet Take 1 tablet (1,000 mg total) by mouth 2 (two) times daily. Repeat labs are due now 180 tablet 0  . Multiple Vitamin (MULTIVITAMIN) tablet Take 1 tablet by mouth daily.    . sodium chloride (MURO 128) 5 % ophthalmic ointment Place 1 application into both eyes at bedtime.     . tamsulosin (FLOMAX) 0.4 MG CAPS capsule Take 1 capsule (0.4 mg total) by mouth daily. 90 capsule 3   No current facility-administered medications on file prior to visit.     Objective:  Objective Physical Exam  Constitutional: He is oriented to person, place, and time. Vital signs are normal. He appears well-developed and well-nourished. He is sleeping.  HENT:  Head: Normocephalic and atraumatic.  Mouth/Throat: Oropharynx is clear and moist.  Eyes: EOM are normal. Pupils are equal, round, and reactive to light.  Neck: Normal range of motion. Neck supple. No thyromegaly present.  Cardiovascular: Normal rate and regular rhythm.   No murmur heard. Pulmonary/Chest: Effort normal and breath sounds normal. No respiratory distress. He has no wheezes. He has no rales. He exhibits no tenderness.  Musculoskeletal: He exhibits no edema or tenderness.  Neurological: He is alert and oriented to person, place, and time.  Skin: Skin is warm and dry.  Psychiatric: He has a normal mood and affect. His behavior is normal. Judgment and thought content normal.  BP 186/78 mmHg  Pulse 72  Temp(Src) 97.7 F (36.5 C) (Oral)  Wt 130 lb 6.4 oz (59.149 kg)  SpO2 97% Wt Readings from Last 3 Encounters:  02/04/15 130 lb 6.4 oz (59.149 kg)  08/26/14 122 lb (55.339 kg)  02/25/14 124 lb 12.5 oz (56.6 kg)     Lab Results  Component Value Date   WBC 9.0 02/28/2014   HGB 11.1* 02/28/2014   HCT 33.3* 02/28/2014   PLT 235.0 02/28/2014   GLUCOSE 255* 02/04/2015   CHOL 133 02/04/2015   TRIG 96.0 02/04/2015   HDL 46.00 02/04/2015   LDLCALC 68 02/04/2015   ALT 16 02/04/2015    AST 15 02/04/2015   NA 137 02/04/2015   K 3.9 02/04/2015   CL 98 02/04/2015   CREATININE 1.08 02/04/2015   BUN 24* 02/04/2015   CO2 34* 02/04/2015   TSH 2.47 10/20/2009   PSA 3.74 02/28/2014   INR 0.97 03/25/2011   HGBA1C 8.0* 02/04/2015   MICROALBUR 25.9* 02/04/2015    No results found.   Assessment & Plan:  Plan I have discontinued Mr. Lafon simvastatin. I am also having him start on amLODipine and atorvastatin. Additionally, I am having him maintain his fish oil-omega-3 fatty acids, aspirin, sodium chloride, multivitamin, tamsulosin, losartan-hydrochlorothiazide, and metFORMIN.  Meds ordered this encounter  Medications  . amLODipine (NORVASC) 5 MG tablet    Sig: Take 1 tablet (5 mg total) by mouth daily.    Dispense:  30 tablet    Refill:  2  . atorvastatin (LIPITOR) 10 MG tablet    Sig: Take 1 tablet (10 mg total) by mouth daily.    Dispense:  90 tablet    Refill:  3    Problem List Items Addressed This Visit    Hyperlipidemia    D/c zocor secondary to norvasc Start lipitor 10 mg Check labs      Relevant Medications   amLODipine (NORVASC) 5 MG tablet   atorvastatin (LIPITOR) 10 MG tablet   Grief reaction    Pt seems to be handling his wifes death well.  The hospice counselor is coming today He has friends and family surrounding him at home He is sleeping well and will let us know if he needs anything      Essential hypertension    con't losartan/ hct Add norvasc 5 mg rto 1 month or sooner prn      Relevant Medications   amLODipine (NORVASC) 5 MG tablet   atorvastatin (LIPITOR) 10 MG tablet   Other Relevant Orders   Basic metabolic panel (Completed)   DM II (diabetes mellitus, type II), controlled    con't metformin Check labs      Relevant Medications   atorvastatin (LIPITOR) 10 MG tablet   BPH with urinary obstruction    con't flomax       Other Visit Diagnoses    Diabetes mellitus type II, uncontrolled    -  Primary    Relevant  Medications    atorvastatin (LIPITOR) 10 MG tablet    Other Relevant Orders    Basic metabolic panel (Completed)    Hemoglobin A1c (Completed)    Hepatic function panel (Completed)    Lipid panel (Completed)    Microalbumin / creatinine urine ratio (Completed)    POCT urinalysis dipstick       Follow-up: Return in about 4 weeks (around 03/04/2015), or if symptoms worsen or fail to improve, for bp check.  Garnet Koyanagi, DO

## 2015-02-04 NOTE — Patient Instructions (Signed)

## 2015-02-04 NOTE — Assessment & Plan Note (Signed)
flomax Noted on imaging, rads recommending PSA 

## 2015-02-04 NOTE — Assessment & Plan Note (Signed)
con't metformin Check labs 

## 2015-02-04 NOTE — Assessment & Plan Note (Signed)
D/c zocor secondary to norvasc Start lipitor 10 mg Check labs

## 2015-02-08 DIAGNOSIS — F430 Acute stress reaction: Secondary | ICD-10-CM | POA: Insufficient documentation

## 2015-02-09 ENCOUNTER — Other Ambulatory Visit: Payer: Self-pay | Admitting: Family Medicine

## 2015-02-21 ENCOUNTER — Other Ambulatory Visit: Payer: Self-pay

## 2015-02-21 DIAGNOSIS — I10 Essential (primary) hypertension: Secondary | ICD-10-CM

## 2015-02-21 MED ORDER — SITAGLIP PHOS-METFORMIN HCL ER 100-1000 MG PO TB24: 1.0000 | ORAL_TABLET | Freq: Every day | ORAL | Status: DC

## 2015-02-26 DIAGNOSIS — H40052 Ocular hypertension, left eye: Secondary | ICD-10-CM | POA: Diagnosis not present

## 2015-02-26 DIAGNOSIS — H4011X1 Primary open-angle glaucoma, mild stage: Secondary | ICD-10-CM | POA: Diagnosis not present

## 2015-02-27 ENCOUNTER — Ambulatory Visit (INDEPENDENT_AMBULATORY_CARE_PROVIDER_SITE_OTHER): Payer: Commercial Managed Care - HMO | Admitting: Family Medicine

## 2015-02-27 ENCOUNTER — Encounter: Payer: Self-pay | Admitting: Family Medicine

## 2015-02-27 VITALS — BP 150/76 | HR 61 | Temp 97.7°F | Wt 127.2 lb

## 2015-02-27 DIAGNOSIS — I10 Essential (primary) hypertension: Secondary | ICD-10-CM | POA: Diagnosis not present

## 2015-02-27 NOTE — Patient Instructions (Signed)

## 2015-02-27 NOTE — Progress Notes (Signed)
Pre visit review using our clinic review tool, if applicable. No additional management support is needed unless otherwise documented below in the visit note. 

## 2015-02-27 NOTE — Progress Notes (Signed)
Patient ID: [name], male    DOB: 1932-05-10  Age: 81 y.o. MRN: [id number]    Subjective:  Subjective HPI DORELL GATLIN presents for f/u bp.  He bp is running much lower at home than in drs office--- see my chart message.    Review of Systems  Constitutional: Negative for diaphoresis, appetite change, fatigue and unexpected weight change.  Eyes: Negative for pain, redness and visual disturbance.  Respiratory: Negative for cough, chest tightness, shortness of breath and wheezing.   Cardiovascular: Negative for chest pain, palpitations and leg swelling.  Endocrine: Negative for cold intolerance, heat intolerance, polydipsia, polyphagia and polyuria.  Genitourinary: Negative for dysuria, frequency and difficulty urinating.  Neurological: Negative for dizziness, light-headedness, numbness and headaches.    History Past Medical History  Diagnosis Date  . DM type 2 (diabetes mellitus, type 2) 2002  . Hypertension   . Hyperlipidemia   . Colon polyps 1996    villous adenoma  . CAD (coronary artery disease)     LHC 03/25/11 by Dr. Burt Knack:  pLAD 99%, oCFX 20-30%, pOM1 40%, dAVCFX 20-30%.  EF was normal on nuclear study.  He was treated with a Promus DES to his pLAD.     He has past surgical history that includes cataract.   His family history includes Coronary artery disease (age of onset: 52) in his father; Stomach cancer (age of onset: 71) in his mother.He reports that he quit smoking about 56 years ago. He has never used smokeless tobacco. He reports that he does not drink alcohol or use illicit drugs.  Current Outpatient Prescriptions on File Prior to Visit  Medication Sig Dispense Refill  . amLODipine (NORVASC) 5 MG tablet Take 1 tablet (5 mg total) by mouth daily. 30 tablet 2  . aspirin 81 MG tablet Take 81 mg by mouth daily.    [name] atorvastatin (LIPITOR) 10 MG tablet Take 1 tablet (10 mg total) by mouth daily. 90 tablet 3  . fish oil-omega-3 fatty acids 1000 MG capsule Take 2 g by  mouth daily.      [name] losartan-hydrochlorothiazide (HYZAAR) 100-25 MG per tablet TAKE 1 TABLET BY MOUTH EVERY DAY 90 tablet 3  . Multiple Vitamin (MULTIVITAMIN) tablet Take 1 tablet by mouth daily.    . SitaGLIPtin-MetFORMIN HCl (JANUMET XR) [phone] MG TB24 Take 1 tablet by mouth daily. 30 tablet 2  . sodium chloride (MURO 128) 5 % ophthalmic ointment Place 1 application into both eyes at bedtime.     . tamsulosin (FLOMAX) 0.4 MG CAPS capsule TAKE 1 CAPSULE (0.4 MG TOTAL) BY MOUTH DAILY. 90 capsule 3   No current facility-administered medications on file prior to visit.     Objective:  Objective Physical Exam  Constitutional: He is oriented to person, place, and time. Vital signs are normal. He appears well-developed and well-nourished. He is sleeping.  HENT:  Head: Normocephalic and atraumatic.  Mouth/Throat: Oropharynx is clear and moist.  Eyes: EOM are normal. Pupils are equal, round, and reactive to light.  Neck: Normal range of motion. Neck supple. No thyromegaly present.  Cardiovascular: Normal rate and regular rhythm.   No murmur heard. Pulmonary/Chest: Effort normal and breath sounds normal. No respiratory distress. He has no wheezes. He has no rales. He exhibits no tenderness.  Musculoskeletal: He exhibits no edema or tenderness.  Neurological: He is alert and oriented to person, place, and time.  Skin: Skin is warm and dry.  Psychiatric: He has a normal mood and affect. His  behavior is normal. Judgment and thought content normal.   BP 150/76 mmHg  Pulse 61  Temp(Src) 97.7 F (36.5 C) (Oral)  Wt 127 lb 3.2 oz (57.698 kg)  SpO2 98% Wt Readings from Last 3 Encounters:  02/27/15 127 lb 3.2 oz (57.698 kg)  02/04/15 130 lb 6.4 oz (59.149 kg)  08/26/14 122 lb (55.339 kg)     Lab Results  Component Value Date   WBC 9.0 02/28/2014   HGB 11.1* 02/28/2014   HCT 33.3* 02/28/2014   PLT 235.0 02/28/2014   GLUCOSE 255* 02/04/2015   CHOL 133 02/04/2015   TRIG 96.0 02/04/2015     HDL 46.00 02/04/2015   LDLCALC 68 02/04/2015   ALT 16 02/04/2015   AST 15 02/04/2015   NA 137 02/04/2015   K 3.9 02/04/2015   CL 98 02/04/2015   CREATININE 1.08 02/04/2015   BUN 24* 02/04/2015   CO2 34* 02/04/2015   TSH 2.47 10/20/2009   PSA 3.74 02/28/2014   INR 0.97 03/25/2011   HGBA1C 8.0* 02/04/2015   MICROALBUR 25.9* 02/04/2015    No results found.   Assessment & Plan:  Plan I am having Mr. Bernhart maintain his fish oil-omega-3 fatty acids, aspirin, sodium chloride, multivitamin, losartan-hydrochlorothiazide, amLODipine, atorvastatin, tamsulosin, and SitaGLIPtin-MetFORMIN HCl.  No orders of the defined types were placed in this encounter.    Problem List Items Addressed This Visit    Essential hypertension - Primary    con't norvasc and losartan bp high in office--- home readings much lower         Follow-up: Return in about 6 months (around 08/27/2015), or if symptoms worsen or fail to improve, for hypertension, hyperlipidemia, diabetes II.  Garnet Koyanagi, DO

## 2015-02-27 NOTE — Assessment & Plan Note (Signed)
con't norvasc and losartan bp high in office--- home readings much lower

## 2015-04-15 ENCOUNTER — Encounter: Payer: Self-pay | Admitting: Gastroenterology

## 2015-04-20 ENCOUNTER — Other Ambulatory Visit: Payer: Self-pay | Admitting: Family Medicine

## 2015-05-28 ENCOUNTER — Other Ambulatory Visit: Payer: Self-pay | Admitting: Family Medicine

## 2015-07-01 ENCOUNTER — Other Ambulatory Visit: Payer: Self-pay | Admitting: Family Medicine

## 2015-07-25 ENCOUNTER — Other Ambulatory Visit: Payer: Self-pay | Admitting: Family Medicine

## 2015-08-27 ENCOUNTER — Telehealth: Payer: Self-pay | Admitting: Family Medicine

## 2015-08-27 NOTE — Telephone Encounter (Signed)
°  Relation to [id number] Call back number:[phone] Pharmacy:  Reason for call: pt states he has an eye appt in the morning at 9:00 (3/23), and they just informed him that he needs a referral, sent to them, [facility] seeing Dr. Rosana Hoes. Pt has [employer], pt states

## 2015-08-28 DIAGNOSIS — H35371 Puckering of macula, right eye: Secondary | ICD-10-CM | POA: Diagnosis not present

## 2015-08-28 DIAGNOSIS — H1851 Endothelial corneal dystrophy: Secondary | ICD-10-CM | POA: Diagnosis not present

## 2015-08-28 DIAGNOSIS — H401121 Primary open-angle glaucoma, left eye, mild stage: Secondary | ICD-10-CM | POA: Diagnosis not present

## 2015-08-28 DIAGNOSIS — H353121 Nonexudative age-related macular degeneration, left eye, early dry stage: Secondary | ICD-10-CM | POA: Diagnosis not present

## 2015-08-28 DIAGNOSIS — H26492 Other secondary cataract, left eye: Secondary | ICD-10-CM | POA: Diagnosis not present

## 2015-08-28 DIAGNOSIS — H401111 Primary open-angle glaucoma, right eye, mild stage: Secondary | ICD-10-CM | POA: Diagnosis not present

## 2015-08-28 DIAGNOSIS — Z961 Presence of intraocular lens: Secondary | ICD-10-CM | POA: Diagnosis not present

## 2015-08-28 DIAGNOSIS — E119 Type 2 diabetes mellitus without complications: Secondary | ICD-10-CM | POA: Diagnosis not present

## 2015-08-28 LAB — HM DIABETES EYE EXAM

## 2015-08-29 ENCOUNTER — Ambulatory Visit: Payer: Commercial Managed Care - HMO | Admitting: Family Medicine

## 2015-09-01 ENCOUNTER — Ambulatory Visit (INDEPENDENT_AMBULATORY_CARE_PROVIDER_SITE_OTHER): Payer: Commercial Managed Care - HMO | Admitting: Family Medicine

## 2015-09-01 ENCOUNTER — Encounter: Payer: Self-pay | Admitting: Family Medicine

## 2015-09-01 VITALS — BP 140/72 | HR 69 | Temp 97.9°F | Ht 64.0 in | Wt 131.8 lb

## 2015-09-01 DIAGNOSIS — E1151 Type 2 diabetes mellitus with diabetic peripheral angiopathy without gangrene: Secondary | ICD-10-CM

## 2015-09-01 DIAGNOSIS — I10 Essential (primary) hypertension: Secondary | ICD-10-CM | POA: Diagnosis not present

## 2015-09-01 DIAGNOSIS — E785 Hyperlipidemia, unspecified: Secondary | ICD-10-CM

## 2015-09-01 DIAGNOSIS — N400 Enlarged prostate without lower urinary tract symptoms: Secondary | ICD-10-CM

## 2015-09-01 LAB — COMPREHENSIVE METABOLIC PANEL
ALBUMIN: 4.2 g/dL (ref 3.5–5.2)
ALT: 15 U/L (ref 0–53)
AST: 15 U/L (ref 0–37)
Alkaline Phosphatase: 89 U/L (ref 39–117)
BUN: 19 mg/dL (ref 6–23)
CALCIUM: 9.5 mg/dL (ref 8.4–10.5)
CHLORIDE: 103 meq/L (ref 96–112)
CO2: 32 meq/L (ref 19–32)
CREATININE: 1.15 mg/dL (ref 0.40–1.50)
GFR: 64.78 mL/min (ref >60.00)
Glucose, Bld: 110 mg/dL — ABNORMAL HIGH (ref 70–99)
POTASSIUM: 4.1 meq/L (ref 3.5–5.1)
Sodium: 141 mEq/L (ref 135–145)
Total Bilirubin: 0.6 mg/dL (ref 0.2–1.2)
Total Protein: 7.2 g/dL (ref 6.0–8.3)

## 2015-09-01 LAB — LIPID PANEL
CHOLESTEROL: 129 mg/dL (ref 0–200)
HDL: 48.5 mg/dL (ref >39.00)
LDL CALC: 66 mg/dL (ref 0–99)
NonHDL: 80.48
TRIGLYCERIDES: 72 mg/dL (ref 0.0–149.0)
Total CHOL/HDL Ratio: 3
VLDL: 14.4 mg/dL (ref 0.0–40.0)

## 2015-09-01 LAB — POCT URINALYSIS DIPSTICK
BILIRUBIN UA: NEGATIVE
GLUCOSE UA: NEGATIVE
Ketones, UA: NEGATIVE
Leukocytes, UA: NEGATIVE
NITRITE UA: NEGATIVE
PH UA: 6
RBC UA: NEGATIVE
SPEC GRAV UA: 1.025
Urobilinogen, UA: 0.2

## 2015-09-01 LAB — HEMOGLOBIN A1C: HEMOGLOBIN A1C: 7.8 % — AB (ref 4.6–6.5)

## 2015-09-01 MED ORDER — AMLODIPINE BESYLATE 5 MG PO TABS: ORAL_TABLET | ORAL | Status: DC

## 2015-09-01 MED ORDER — SITAGLIP PHOS-METFORMIN HCL ER 100-1000 MG PO TB24: 1.0000 | ORAL_TABLET | Freq: Every day | ORAL | Status: DC

## 2015-09-01 MED ORDER — LOSARTAN POTASSIUM-HCTZ 100-25 MG PO TABS: ORAL_TABLET | ORAL | Status: DC

## 2015-09-01 MED ORDER — TAMSULOSIN HCL 0.4 MG PO CAPS: ORAL_CAPSULE | ORAL | Status: DC

## 2015-09-01 NOTE — Progress Notes (Signed)
Patient ID: [name], male    DOB: 05-11-1932  Age: 82 y.o. MRN: [id number]    Subjective:  Subjective HPI [name] presents for f/u dm, cholesterol and htn.    HPI [name]  Blood pressure range-not checking  Chest pain- no      Dyspnea- no Lightheadedness- no   Edema- no Other side effects - no   Medication compliance: good Low salt diet- yes  DIABETES  Blood Sugar ranges-not checking   Polyuria- no New Visual problems- yes Hypoglycemic symptoms- no Other side effects-no Medication compliance - good Last eye exam- 08/28/2015 Foot exam- today  HYPERLIPIDEMIA  Medication compliance- no RUQ pain- no  Muscle aches- no Other side effects-no   Review of Systems  Constitutional: Negative for diaphoresis, appetite change, fatigue and unexpected weight change.  Eyes: Negative for pain, redness and visual disturbance.  Respiratory: Negative for cough, chest tightness, shortness of breath and wheezing.   Cardiovascular: Negative for chest pain, palpitations and leg swelling.  Endocrine: Negative for cold intolerance, heat intolerance, polydipsia, polyphagia and polyuria.  Genitourinary: Negative for dysuria, frequency and difficulty urinating.  Neurological: Negative for dizziness, light-headedness, numbness and headaches.    History Past Medical History  Diagnosis Date  . DM type 2 (diabetes mellitus, type 2) ([place]) 2002  . [name]   . Hyperlipidemia   . Colon polyps 1996    villous adenoma  . CAD (coronary artery disease)     LHC 03/25/11 by Dr. Burt Knack:  pLAD 99%, oCFX 20-30%, pOM1 40%, dAVCFX 20-30%.  EF was normal on nuclear study.  He was treated with a Promus DES to his pLAD.     He has past surgical history that includes cataract.   His family history includes Coronary artery disease (age of onset: 38) in his father; Stomach cancer (age of onset: 2) in his mother.He reports that he quit smoking about 57 years ago. He has never used smokeless  tobacco. He reports that he does not drink alcohol or use illicit drugs.  Current Outpatient Prescriptions on File Prior to Visit  Medication Sig Dispense Refill  . aspirin 81 MG tablet Take 81 mg by mouth daily.    [name] atorvastatin (LIPITOR) 10 MG tablet Take 1 tablet (10 mg total) by mouth daily. 90 tablet 3  . fish oil-omega-3 fatty acids 1000 MG capsule Take 2 g by mouth daily.      . Multiple Vitamin (MULTIVITAMIN) tablet Take 1 tablet by mouth daily.    . sodium chloride (MURO 128) 5 % ophthalmic ointment Place 1 application into both eyes 3 (three) times daily.      No current facility-administered medications on file prior to visit.     Objective:  Objective Physical Exam  Constitutional: He is oriented to person, place, and time. Vital signs are normal. He appears well-developed and well-nourished. He is sleeping.  HENT:  Head: Normocephalic and atraumatic.  Mouth/Throat: Oropharynx is clear and moist.  Eyes: EOM are normal. Pupils are equal, round, and reactive to light.  Neck: Normal range of motion. Neck supple. No thyromegaly present.  Cardiovascular: Normal rate and regular rhythm.   No murmur heard. Pulmonary/Chest: Effort normal and breath sounds normal. No respiratory distress. He has no wheezes. He has no rales. He exhibits no tenderness.  Musculoskeletal: He exhibits no edema or tenderness.  Neurological: He is alert and oriented to person, place, and time.  Skin: Skin is warm and dry.  Psychiatric: He has a normal mood and  affect. His behavior is normal. Judgment and thought content normal.  Nursing note and vitals reviewed. Sensory exam of the foot is normal, tested with the monofilament. Good pulses, no lesions or ulcers, good peripheral pulses.   BP 140/72 mmHg  Pulse 69  Temp(Src) 97.9 F (36.6 C) (Oral)  Ht 5\' 4"  (1.626 m)  Wt 131 lb 12.8 oz (59.784 kg)  BMI 22.61 kg/m2  SpO2 98% Wt Readings from Last 3 Encounters:  09/01/15 131 lb 12.8 oz (59.784 kg)    02/27/15 127 lb 3.2 oz (57.698 kg)  02/04/15 130 lb 6.4 oz (59.149 kg)     Lab Results  Component Value Date   WBC 9.0 02/28/2014   HGB 11.1* 02/28/2014   HCT 33.3* 02/28/2014   PLT 235.0 02/28/2014   GLUCOSE 110* 09/01/2015   CHOL 129 09/01/2015   TRIG 72.0 09/01/2015   HDL 48.50 09/01/2015   LDLCALC 66 09/01/2015   ALT 15 09/01/2015   AST 15 09/01/2015   NA 141 09/01/2015   K 4.1 09/01/2015   CL 103 09/01/2015   CREATININE 1.15 09/01/2015   BUN 19 09/01/2015   CO2 32 09/01/2015   TSH 2.47 10/20/2009   PSA 3.74 02/28/2014   INR 0.97 03/25/2011   HGBA1C 7.8* 09/01/2015   MICROALBUR 25.9* 02/04/2015    No results found.   Assessment & Plan:  Plan I have changed [name] XR to SitaGLIPtin-MetFORMIN HCl. I have also changed his losartan-hydrochlorothiazide. I am also having him maintain his fish oil-omega-3 fatty acids, aspirin, sodium chloride, multivitamin, atorvastatin, brimonidine-timolol, tamsulosin, and amLODipine.  Meds ordered this encounter  Medications  . brimonidine-timolol (COMBIGAN) 0.2-0.5 % ophthalmic solution    Sig: Place 1 drop into the left eye 2 (two) times daily.  . tamsulosin (FLOMAX) 0.4 MG CAPS capsule    Sig: TAKE 1 CAPSULE (0.4 MG TOTAL) BY MOUTH DAILY.    Dispense:  90 capsule    Refill:  3  . losartan-hydrochlorothiazide (HYZAAR) 100-25 MG tablet    Sig: TAKE 1 TABLET BY MOUTH EVERY DAY    Dispense:  90 tablet    Refill:  3  . SitaGLIPtin-MetFORMIN HCl (JANUMET XR) [phone] MG TB24    Sig: Take 1 tablet by mouth daily.    Dispense:  30 tablet    Refill:  5  . amLODipine (NORVASC) 5 MG tablet    Sig: TAKE 1 TABLET (5 MG TOTAL) BY MOUTH DAILY.    Dispense:  30 tablet    Refill:  2    Problem List Items Addressed This Visit      Unprioritized   Hyperlipidemia   Relevant Medications   losartan-hydrochlorothiazide (HYZAAR) 100-25 MG tablet   amLODipine (NORVASC) 5 MG tablet   Other Relevant Orders   Comprehensive  metabolic panel (Completed)   Lipid panel (Completed)   POCT urinalysis dipstick (Completed)   Essential [name]   Relevant Medications   losartan-hydrochlorothiazide (HYZAAR) 100-25 MG tablet   amLODipine (NORVASC) 5 MG tablet   Other Relevant Orders   Comprehensive metabolic panel (Completed)   Hemoglobin A1c (Completed)   POCT urinalysis dipstick (Completed)    Other Visit Diagnoses    DM (diabetes mellitus) type II controlled peripheral vascular disorder (HCC)    -  Primary    Relevant Medications    losartan-hydrochlorothiazide (HYZAAR) 100-25 MG tablet    SitaGLIPtin-MetFORMIN HCl (JANUMET XR) [phone] MG TB24    amLODipine (NORVASC) 5 MG tablet    Other Relevant Orders  POCT urinalysis dipstick (Completed)    BPH (benign prostatic hypertrophy)        Relevant Medications    tamsulosin (FLOMAX) 0.4 MG CAPS capsule       Follow-up: Return in about 6 months (around 03/03/2016), or if symptoms worsen or fail to improve, for [name], hyperlipidemia, diabetes II.  Garnet Koyanagi, DO

## 2015-09-01 NOTE — Progress Notes (Signed)
Pre visit review using our clinic review tool, if applicable. No additional management support is needed unless otherwise documented below in the visit note. 

## 2015-09-01 NOTE — Patient Instructions (Signed)

## 2015-09-05 NOTE — Addendum Note (Signed)
Addended by: Tasia Catchings on: 09/05/2015 11:42 AM   Modules accepted: Orders

## 2015-09-17 ENCOUNTER — Encounter: Payer: Self-pay | Admitting: Family Medicine

## 2015-09-23 ENCOUNTER — Other Ambulatory Visit: Payer: Self-pay | Admitting: Family Medicine

## 2015-10-24 ENCOUNTER — Other Ambulatory Visit (INDEPENDENT_AMBULATORY_CARE_PROVIDER_SITE_OTHER): Payer: Commercial Managed Care - HMO

## 2015-10-24 DIAGNOSIS — I10 Essential (primary) hypertension: Secondary | ICD-10-CM

## 2015-10-24 DIAGNOSIS — E1151 Type 2 diabetes mellitus with diabetic peripheral angiopathy without gangrene: Secondary | ICD-10-CM | POA: Diagnosis not present

## 2015-10-24 DIAGNOSIS — E785 Hyperlipidemia, unspecified: Secondary | ICD-10-CM

## 2015-10-24 DIAGNOSIS — N400 Enlarged prostate without lower urinary tract symptoms: Secondary | ICD-10-CM

## 2015-10-24 LAB — LIPID PANEL
CHOLESTEROL: 146 mg/dL (ref 0–200)
HDL: 37.3 mg/dL — AB (ref >39.00)
LDL Cholesterol: 83 mg/dL (ref 0–99)
NonHDL: 108.72
Total CHOL/HDL Ratio: 4
Triglycerides: 130 mg/dL (ref 0.0–149.0)
VLDL: 26 mg/dL (ref 0.0–40.0)

## 2015-10-24 LAB — COMPREHENSIVE METABOLIC PANEL
ALBUMIN: 3.8 g/dL (ref 3.5–5.2)
ALT: 14 U/L (ref 0–53)
AST: 14 U/L (ref 0–37)
Alkaline Phosphatase: 94 U/L (ref 39–117)
BUN: 26 mg/dL — ABNORMAL HIGH (ref 6–23)
CALCIUM: 9.3 mg/dL (ref 8.4–10.5)
CHLORIDE: 106 meq/L (ref 96–112)
CO2: 29 mEq/L (ref 19–32)
Creatinine, Ser: 1.22 mg/dL (ref 0.40–1.50)
GFR: 60.49 mL/min (ref >60.00)
Glucose, Bld: 175 mg/dL — ABNORMAL HIGH (ref 70–99)
POTASSIUM: 4 meq/L (ref 3.5–5.1)
Sodium: 141 mEq/L (ref 135–145)
Total Bilirubin: 0.5 mg/dL (ref 0.2–1.2)
Total Protein: 6.4 g/dL (ref 6.0–8.3)

## 2015-10-24 LAB — HEMOGLOBIN A1C: HEMOGLOBIN A1C: 7.6 % — AB (ref 4.6–6.5)

## 2015-11-05 ENCOUNTER — Telehealth: Payer: Self-pay | Admitting: *Deleted

## 2015-11-05 DIAGNOSIS — H35371 Puckering of macula, right eye: Secondary | ICD-10-CM | POA: Diagnosis not present

## 2015-11-05 DIAGNOSIS — H547 Unspecified visual loss: Secondary | ICD-10-CM | POA: Diagnosis not present

## 2015-11-05 MED ORDER — GLIMEPIRIDE 2 MG PO TABS: 2.0000 mg | ORAL_TABLET | Freq: Every day | ORAL | Status: DC

## 2015-11-05 NOTE — Telephone Encounter (Signed)
Ok to restart losartan but needs ov for bp check

## 2015-11-05 NOTE — Telephone Encounter (Signed)
Unable to reach the pt by phone.//AB/CMA 

## 2015-11-05 NOTE — Telephone Encounter (Signed)
Pt states BP has been running 190s/70s since last OV. He is unable to get actual readings from home [place].  States losartan hctz was stopped at last visit and he has only been taking amlodipine 5mg  daily. Please advise?

## 2015-11-07 NOTE — Telephone Encounter (Signed)
VM left and My-chart message sent.     KP

## 2015-11-12 ENCOUNTER — Other Ambulatory Visit: Payer: Self-pay | Admitting: Family Medicine

## 2015-11-19 DIAGNOSIS — Z09 Encounter for follow-up examination after completed treatment for conditions other than malignant neoplasm: Secondary | ICD-10-CM | POA: Diagnosis not present

## 2015-11-19 DIAGNOSIS — H35371 Puckering of macula, right eye: Secondary | ICD-10-CM | POA: Diagnosis not present

## 2015-11-21 ENCOUNTER — Other Ambulatory Visit: Payer: Self-pay | Admitting: Family Medicine

## 2015-11-21 NOTE — Telephone Encounter (Signed)
"  Rx denied.   Filled on (09/23/15).//AB/CMA

## 2015-12-03 ENCOUNTER — Telehealth: Payer: Self-pay | Admitting: Family Medicine

## 2015-12-03 NOTE — Telephone Encounter (Signed)
Relation to [id number] Call back number: [phone]   Reason for call:  Patient was informed to follow up with BP readings. Patient states he didn't document the time and states he does go to the gym everyday. As per patient if PCP would like him to follow up he will be available after vacation to [place] returning 12/12/15. Blood Pressure, Pulse Ox and pulse readings are as follows:  BP readings taking in the afternoon  11/15/15 99/83 pulse ox 93 pulse 66 11/20/15 110/63 pulse ox 95 pulse 75  12/03/15 11616 pulse ox 98 pulse 70

## 2015-12-03 NOTE — Telephone Encounter (Signed)
Running low--- how is he feeling If no symptoms low bp---  F/u when he gets back--- Have Fun!!!

## 2015-12-04 NOTE — Telephone Encounter (Signed)
Great!

## 2015-12-04 NOTE — Telephone Encounter (Signed)
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 He will also be featured in the health magazine.  He will follow up when he gets back if he needs to, he has a pending apt for September.     KP

## 2016-01-14 ENCOUNTER — Other Ambulatory Visit: Payer: Self-pay | Admitting: Family Medicine

## 2016-01-15 DIAGNOSIS — H35351 Cystoid macular degeneration, right eye: Secondary | ICD-10-CM | POA: Diagnosis not present

## 2016-01-15 DIAGNOSIS — Z09 Encounter for follow-up examination after completed treatment for conditions other than malignant neoplasm: Secondary | ICD-10-CM | POA: Diagnosis not present

## 2016-02-06 ENCOUNTER — Other Ambulatory Visit: Payer: Self-pay

## 2016-02-06 DIAGNOSIS — E1151 Type 2 diabetes mellitus with diabetic peripheral angiopathy without gangrene: Secondary | ICD-10-CM

## 2016-02-06 MED ORDER — SITAGLIP PHOS-METFORMIN HCL ER 100-1000 MG PO TB24: 1.0000 | ORAL_TABLET | Freq: Every day | ORAL | 1 refills | Status: DC

## 2016-02-14 ENCOUNTER — Other Ambulatory Visit: Payer: Self-pay | Admitting: Family Medicine

## 2016-02-14 DIAGNOSIS — E785 Hyperlipidemia, unspecified: Secondary | ICD-10-CM

## 2016-02-19 DIAGNOSIS — E119 Type 2 diabetes mellitus without complications: Secondary | ICD-10-CM | POA: Diagnosis not present

## 2016-02-19 DIAGNOSIS — H35351 Cystoid macular degeneration, right eye: Secondary | ICD-10-CM | POA: Diagnosis not present

## 2016-02-19 DIAGNOSIS — H35371 Puckering of macula, right eye: Secondary | ICD-10-CM | POA: Diagnosis not present

## 2016-02-23 DIAGNOSIS — H40052 Ocular hypertension, left eye: Secondary | ICD-10-CM | POA: Diagnosis not present

## 2016-02-23 DIAGNOSIS — H401121 Primary open-angle glaucoma, left eye, mild stage: Secondary | ICD-10-CM | POA: Diagnosis not present

## 2016-02-23 DIAGNOSIS — H401111 Primary open-angle glaucoma, right eye, mild stage: Secondary | ICD-10-CM | POA: Diagnosis not present

## 2016-02-23 DIAGNOSIS — H02833 Dermatochalasis of right eye, unspecified eyelid: Secondary | ICD-10-CM | POA: Diagnosis not present

## 2016-02-23 DIAGNOSIS — H01003 Unspecified blepharitis right eye, unspecified eyelid: Secondary | ICD-10-CM | POA: Diagnosis not present

## 2016-02-23 DIAGNOSIS — H5213 Myopia, bilateral: Secondary | ICD-10-CM | POA: Diagnosis not present

## 2016-03-01 ENCOUNTER — Ambulatory Visit (INDEPENDENT_AMBULATORY_CARE_PROVIDER_SITE_OTHER): Payer: Commercial Managed Care - HMO | Admitting: Family Medicine

## 2016-03-01 ENCOUNTER — Encounter: Payer: Self-pay | Admitting: Family Medicine

## 2016-03-01 VITALS — BP 155/65 | HR 81 | Temp 97.5°F | Resp 16 | Ht 64.0 in | Wt 133.2 lb

## 2016-03-01 DIAGNOSIS — N400 Enlarged prostate without lower urinary tract symptoms: Secondary | ICD-10-CM

## 2016-03-01 DIAGNOSIS — E785 Hyperlipidemia, unspecified: Secondary | ICD-10-CM

## 2016-03-01 DIAGNOSIS — E1151 Type 2 diabetes mellitus with diabetic peripheral angiopathy without gangrene: Secondary | ICD-10-CM

## 2016-03-01 DIAGNOSIS — I10 Essential (primary) hypertension: Secondary | ICD-10-CM | POA: Diagnosis not present

## 2016-03-01 LAB — LIPID PANEL
Cholesterol: 132 mg/dL (ref 0–200)
HDL: 45 mg/dL (ref >39.00)
LDL Cholesterol: 65 mg/dL (ref 0–99)
NonHDL: 86.75
TRIGLYCERIDES: 108 mg/dL (ref 0.0–149.0)
Total CHOL/HDL Ratio: 3
VLDL: 21.6 mg/dL (ref 0.0–40.0)

## 2016-03-01 LAB — HEMOGLOBIN A1C: Hgb A1c MFr Bld: 6.7 % — ABNORMAL HIGH (ref 4.6–6.5)

## 2016-03-01 LAB — COMPREHENSIVE METABOLIC PANEL
ALT: 14 U/L (ref 0–53)
AST: 16 U/L (ref 0–37)
Albumin: 4 g/dL (ref 3.5–5.2)
Alkaline Phosphatase: 77 U/L (ref 39–117)
BUN: 24 mg/dL — ABNORMAL HIGH (ref 6–23)
CHLORIDE: 103 meq/L (ref 96–112)
CO2: 31 meq/L (ref 19–32)
CREATININE: 1.26 mg/dL (ref 0.40–1.50)
Calcium: 9.2 mg/dL (ref 8.4–10.5)
GFR: 58.22 mL/min — ABNORMAL LOW (ref >60.00)
GLUCOSE: 91 mg/dL (ref 70–99)
Potassium: 4 mEq/L (ref 3.5–5.1)
SODIUM: 142 meq/L (ref 135–145)
Total Bilirubin: 0.7 mg/dL (ref 0.2–1.2)
Total Protein: 7.3 g/dL (ref 6.0–8.3)

## 2016-03-01 MED ORDER — TAMSULOSIN HCL 0.4 MG PO CAPS: ORAL_CAPSULE | ORAL | 3 refills | Status: DC

## 2016-03-01 MED ORDER — AMLODIPINE BESYLATE 5 MG PO TABS: ORAL_TABLET | ORAL | 5 refills | Status: DC

## 2016-03-01 MED ORDER — SITAGLIP PHOS-METFORMIN HCL ER 100-1000 MG PO TB24: 1.0000 | ORAL_TABLET | Freq: Every day | ORAL | 1 refills | Status: DC

## 2016-03-01 MED ORDER — LOSARTAN POTASSIUM-HCTZ 100-25 MG PO TABS: 1.0000 | ORAL_TABLET | Freq: Every day | ORAL | 3 refills | Status: DC

## 2016-03-01 MED ORDER — GLIMEPIRIDE 2 MG PO TABS: ORAL_TABLET | ORAL | 2 refills | Status: DC

## 2016-03-01 MED ORDER — ATORVASTATIN CALCIUM 10 MG PO TABS: 10.0000 mg | ORAL_TABLET | Freq: Every day | ORAL | 0 refills | Status: DC

## 2016-03-01 NOTE — Progress Notes (Signed)
Pre visit review using our clinic review tool, if applicable. No additional management support is needed unless otherwise documented below in the visit note. 

## 2016-03-01 NOTE — Patient Instructions (Signed)

## 2016-03-01 NOTE — Progress Notes (Signed)
Patient ID: [name], male    DOB: 1931-07-12  Age: 82 y.o. MRN: [id number]    Subjective:  Subjective  HPI [name] presents for f/u bp , cholesterol and dm.  No complaints.   HPI HYPERTENSION   Blood pressure range-not checking   Chest pain- no      Dyspnea- no Lightheadedness- no   Edema- no  Other side effects - no   Medication compliance: good Low salt diet- yes    DIABETES    Blood Sugar ranges-not checking   Polyuria- no New Visual problems- no  Hypoglycemic symptoms- no  Other side effects-no Medication compliance - good Last eye exam- last week Foot exam- today   HYPERLIPIDEMIA  Medication compliance- good RUQ pain- no  Muscle aches- no Other side effects-no   Review of Systems  Constitutional: Negative for appetite change, diaphoresis, fatigue and unexpected weight change.  Eyes: Negative for pain, redness and visual disturbance.  Respiratory: Negative for cough, chest tightness, shortness of breath and wheezing.   Cardiovascular: Negative for chest pain, palpitations and leg swelling.  Endocrine: Negative for cold intolerance, heat intolerance, polydipsia, polyphagia and polyuria.  Genitourinary: Negative for difficulty urinating, dysuria and frequency.  Neurological: Negative for dizziness, light-headedness, numbness and headaches.    History Past Medical History:  Diagnosis Date  . CAD (coronary artery disease)    LHC 03/25/11 by Dr. Burt Knack:  pLAD 99%, oCFX 20-30%, pOM1 40%, dAVCFX 20-30%.  EF was normal on nuclear study.  He was treated with a Promus DES to his pLAD.   [name] Colon polyps 1996   villous adenoma  . DM type 2 (diabetes mellitus, type 2) ([place]) 2002  . Hyperlipidemia   . Hypertension     He has a past surgical history that includes cataract.   His family history includes Coronary artery disease (age of onset: 62) in his father; Stomach cancer (age of onset: 84) in his mother.He reports that he quit smoking about 57 years ago. He  smoked 1.00 pack per day. He has never used smokeless tobacco. He reports that he does not drink alcohol or use drugs.  Current Outpatient Prescriptions on File Prior to Visit  Medication Sig Dispense Refill  . aspirin 81 MG tablet Take 81 mg by mouth daily.    . brimonidine-timolol (COMBIGAN) 0.2-0.5 % ophthalmic solution Place 1 drop into the left eye 2 (two) times daily.    . fish oil-omega-3 fatty acids 1000 MG capsule Take 2 g by mouth daily.      . Multiple Vitamin (MULTIVITAMIN) tablet Take 1 tablet by mouth daily.    . sodium chloride (MURO 128) 5 % ophthalmic ointment Place 1 application into both eyes 3 (three) times daily.      No current facility-administered medications on file prior to visit.      Objective:  Objective  Physical Exam  Constitutional: He is oriented to person, place, and time. Vital signs are normal. He appears well-developed and well-nourished. He is sleeping.  HENT:  Head: Normocephalic and atraumatic.  Mouth/Throat: Oropharynx is clear and moist.  Eyes: EOM are normal. Pupils are equal, round, and reactive to light.  Neck: Normal range of motion. Neck supple. No thyromegaly present.  Cardiovascular: Normal rate and regular rhythm.   No murmur heard. Pulmonary/Chest: Effort normal and breath sounds normal. No respiratory distress. He has no wheezes. He has no rales. He exhibits no tenderness.  Musculoskeletal: He exhibits no edema or tenderness.  Neurological: He is  alert and oriented to person, place, and time.  Skin: Skin is warm and dry.  Psychiatric: He has a normal mood and affect. His behavior is normal. Judgment and thought content normal.  Nursing note and vitals reviewed. Sensory exam of the foot is normal, tested with the monofilament. Good pulses, no lesions or ulcers, good peripheral pulses.  BP (!) 155/65 (BP Location: Right Arm, Patient Position: Sitting, Cuff Size: Normal)   Pulse 81   Temp 97.5 F (36.4 C) (Oral)   Resp 16   Ht 5'  4" (1.626 m)   Wt 133 lb 3.2 oz (60.4 kg)   SpO2 100%   BMI 22.86 kg/m  Wt Readings from Last 3 Encounters:  03/01/16 133 lb 3.2 oz (60.4 kg)  09/01/15 131 lb 12.8 oz (59.8 kg)  02/27/15 127 lb 3.2 oz (57.7 kg)     Lab Results  Component Value Date   WBC 9.0 02/28/2014   HGB 11.1 (L) 02/28/2014   HCT 33.3 (L) 02/28/2014   PLT 235.0 02/28/2014   GLUCOSE 91 03/01/2016   CHOL 132 03/01/2016   TRIG 108.0 03/01/2016   HDL 45.00 03/01/2016   LDLCALC 65 03/01/2016   ALT 14 03/01/2016   AST 16 03/01/2016   NA 142 03/01/2016   K 4.0 03/01/2016   CL 103 03/01/2016   CREATININE 1.26 03/01/2016   BUN 24 (H) 03/01/2016   CO2 31 03/01/2016   TSH 2.47 10/20/2009   PSA 3.74 02/28/2014   INR 0.97 03/25/2011   HGBA1C 6.7 (H) 03/01/2016   MICROALBUR 25.9 (H) 02/04/2015    No results found.   Assessment & Plan:  Plan  I have changed Mr. Kulpa losartan-hydrochlorothiazide. I am also having him maintain his fish oil-omega-3 fatty acids, aspirin, sodium chloride, multivitamin, brimonidine-timolol, tamsulosin, SitaGLIPtin-MetFORMIN HCl, glimepiride, atorvastatin, and amLODipine.  Meds ordered this encounter  Medications  . tamsulosin (FLOMAX) 0.4 MG CAPS capsule    Sig: TAKE 1 CAPSULE (0.4 MG TOTAL) BY MOUTH DAILY.    Dispense:  90 capsule    Refill:  3  . SitaGLIPtin-MetFORMIN HCl (JANUMET XR) [phone] MG TB24    Sig: Take 1 tablet by mouth daily.    Dispense:  90 tablet    Refill:  1  . losartan-hydrochlorothiazide (HYZAAR) 100-25 MG tablet    Sig: Take 1 tablet by mouth daily.    Dispense:  90 tablet    Refill:  3  . glimepiride (AMARYL) 2 MG tablet    Sig: TAKE 1 TABLET BY MOUTH EVERY DAY BEFORE BREAKFAST    Dispense:  30 tablet    Refill:  2    PLEASE SEND 90 DAY SUPPLY. THANKS  . atorvastatin (LIPITOR) 10 MG tablet    Sig: Take 1 tablet (10 mg total) by mouth daily. REPEAT LABS ARE DUE NOW    Dispense:  90 tablet    Refill:  0  . amLODipine (NORVASC) 5 MG tablet     Sig: TAKE 1 TABLET (5 MG TOTAL) BY MOUTH DAILY.    Dispense:  30 tablet    Refill:  5    Problem List Items Addressed This Visit      Unprioritized   Hyperlipidemia   Relevant Medications   losartan-hydrochlorothiazide (HYZAAR) 100-25 MG tablet   atorvastatin (LIPITOR) 10 MG tablet   amLODipine (NORVASC) 5 MG tablet   Other Relevant Orders   Comprehensive metabolic panel (Completed)   Lipid panel (Completed)   Essential hypertension - Primary   Relevant Medications  losartan-hydrochlorothiazide (HYZAAR) 100-25 MG tablet   atorvastatin (LIPITOR) 10 MG tablet   amLODipine (NORVASC) 5 MG tablet   Other Relevant Orders   Comprehensive metabolic panel (Completed)    Other Visit Diagnoses    BPH (benign prostatic hypertrophy)       Relevant Medications   tamsulosin (FLOMAX) 0.4 MG CAPS capsule   DM (diabetes mellitus) type II controlled peripheral vascular disorder (HCC)       Relevant Medications   SitaGLIPtin-MetFORMIN HCl (JANUMET XR) [phone] MG TB24   losartan-hydrochlorothiazide (HYZAAR) 100-25 MG tablet   glimepiride (AMARYL) 2 MG tablet   atorvastatin (LIPITOR) 10 MG tablet   amLODipine (NORVASC) 5 MG tablet   Other Relevant Orders   Comprehensive metabolic panel (Completed)   Hemoglobin A1c (Completed)      Follow-up: Return in about 6 months (around 08/29/2016).  [name], DO

## 2016-03-25 ENCOUNTER — Encounter: Payer: Self-pay | Admitting: Family Medicine

## 2016-03-30 ENCOUNTER — Other Ambulatory Visit: Payer: Self-pay

## 2016-03-30 DIAGNOSIS — E1151 Type 2 diabetes mellitus with diabetic peripheral angiopathy without gangrene: Secondary | ICD-10-CM

## 2016-03-30 MED ORDER — SITAGLIP PHOS-METFORMIN HCL ER 100-1000 MG PO TB24: 1.0000 | ORAL_TABLET | Freq: Every day | ORAL | 1 refills | Status: DC

## 2016-04-06 ENCOUNTER — Encounter (HOSPITAL_COMMUNITY): Payer: Self-pay | Admitting: Emergency Medicine

## 2016-04-06 ENCOUNTER — Emergency Department (HOSPITAL_COMMUNITY)
Admission: EM | Admit: 2016-04-06 | Discharge: 2016-04-06 | Disposition: A | Discharge status: Home / Self Care | Payer: Commercial Managed Care - HMO | Attending: Emergency Medicine | Admitting: Emergency Medicine

## 2016-04-06 ENCOUNTER — Emergency Department (HOSPITAL_COMMUNITY): Payer: Commercial Managed Care - HMO

## 2016-04-06 DIAGNOSIS — Z955 Presence of coronary angioplasty implant and graft: Secondary | ICD-10-CM | POA: Insufficient documentation

## 2016-04-06 DIAGNOSIS — Z87891 Personal history of nicotine dependence: Secondary | ICD-10-CM | POA: Insufficient documentation

## 2016-04-06 DIAGNOSIS — R079 Chest pain, unspecified: Secondary | ICD-10-CM | POA: Diagnosis not present

## 2016-04-06 DIAGNOSIS — E119 Type 2 diabetes mellitus without complications: Secondary | ICD-10-CM | POA: Diagnosis not present

## 2016-04-06 DIAGNOSIS — I2510 Atherosclerotic heart disease of native coronary artery without angina pectoris: Secondary | ICD-10-CM | POA: Insufficient documentation

## 2016-04-06 DIAGNOSIS — I10 Essential (primary) hypertension: Secondary | ICD-10-CM | POA: Diagnosis not present

## 2016-04-06 DIAGNOSIS — R0781 Pleurodynia: Secondary | ICD-10-CM | POA: Diagnosis present

## 2016-04-06 DIAGNOSIS — R0789 Other chest pain: Secondary | ICD-10-CM | POA: Diagnosis not present

## 2016-04-06 DIAGNOSIS — Z7982 Long term (current) use of aspirin: Secondary | ICD-10-CM | POA: Diagnosis not present

## 2016-04-06 DIAGNOSIS — S299XXA Unspecified injury of thorax, initial encounter: Secondary | ICD-10-CM | POA: Diagnosis not present

## 2016-04-06 DIAGNOSIS — Z79899 Other long term (current) drug therapy: Secondary | ICD-10-CM | POA: Diagnosis not present

## 2016-04-06 LAB — I-STAT CHEM 8, ED
BUN: 28 mg/dL — ABNORMAL HIGH (ref 6–20)
CALCIUM ION: 1.13 mmol/L — AB (ref 1.15–1.40)
Chloride: 102 mmol/L (ref 101–111)
Creatinine, Ser: 1.3 mg/dL — ABNORMAL HIGH (ref 0.61–1.24)
GLUCOSE: 198 mg/dL — AB (ref 65–99)
HCT: 37 % — ABNORMAL LOW (ref 39.0–52.0)
HEMOGLOBIN: 12.6 g/dL — AB (ref 13.0–17.0)
Potassium: 4 mmol/L (ref 3.5–5.1)
SODIUM: 143 mmol/L (ref 135–145)
TCO2: 28 mmol/L (ref 0–100)

## 2016-04-06 MED ORDER — HYDROCODONE-ACETAMINOPHEN 5-325 MG PO TABS: 1.0000 | ORAL_TABLET | Freq: Four times a day (QID) | ORAL | 0 refills | Status: DC | PRN

## 2016-04-06 MED ORDER — HYDROCODONE-ACETAMINOPHEN 5-325 MG PO TABS
1.0000 | ORAL_TABLET | Freq: Once | ORAL | Status: AC
  Administered 2016-04-06: 1 via ORAL
  Filled 2016-04-06: qty 1

## 2016-04-06 NOTE — ED Provider Notes (Signed)
[facility] DEPT Provider Note   CSN: [id number] Arrival date & time: 04/06/16  0231     History   Chief Complaint Chief Complaint  Patient presents with  . Fall  . Ribcage Pain    HPI [name] is a 82 y.o. male.  HPI  This is an 82 year old male who presents for chest pain. Patient reports that he fell onto his left side one week ago from standing. He noted some arm pain at that time but did not seek treatment. Since that time he has had some nagging left-sided chest pain. Earlier this evening he sneezed and pain became worse. It is worse with movement, position changes, and deep inspiration. Currently he rates his pain at 7 out of 10. He has not taken anything for the pain.  He denies shortness of breath, cough, fever. Denies other injury.   Past Medical History:  Diagnosis Date  . CAD (coronary artery disease)    LHC 03/25/11 by Dr. Burt Knack:  pLAD 99%, oCFX 20-30%, pOM1 40%, dAVCFX 20-30%.  EF was normal on nuclear study.  He was treated with a Promus DES to his pLAD.   [name] Colon polyps 1996   villous adenoma  . DM type 2 (diabetes mellitus, type 2) ([place]) 2002  . Hyperlipidemia   . Hypertension     Patient Active Problem List   Diagnosis Date Noted  . Stress reaction 02/08/2015  . Grief reaction 02/04/2015  . BPH with urinary obstruction 12/13/2012  . CAD (coronary artery disease) 04/09/2011  . GASTRITIS 05/25/2010  . COLONIC POLYPS, ADENOMATOUS, HX OF 04/20/2010  . ANEMIA 10/27/2009  . DM II (diabetes mellitus, type II), controlled ([place]) 07/04/2006  . Hyperlipidemia 07/04/2006  . Essential hypertension 07/04/2006    Past Surgical History:  Procedure Laterality Date  . cataract    . CORONARY STENT PLACEMENT         Home Medications    Prior to Admission medications   Medication Sig Start Date End Date Taking? Authorizing Provider  amLODipine (NORVASC) 5 MG tablet TAKE 1 TABLET (5 MG TOTAL) BY MOUTH DAILY. 03/01/16  Yes [name] [name], DO    aspirin 81 MG tablet Take 81 mg by mouth daily.   Yes Historical Provider, MD  atorvastatin (LIPITOR) 10 MG tablet Take 1 tablet (10 mg total) by mouth daily. REPEAT LABS ARE DUE NOW 03/01/16  Yes [name] [name], DO  brimonidine-timolol (COMBIGAN) 0.2-0.5 % ophthalmic solution Place 1 drop into the left eye 2 (two) times daily.   Yes Historical Provider, MD  fish oil-omega-3 fatty acids 1000 MG capsule Take 2 g by mouth daily.     Yes Historical Provider, MD  glimepiride (AMARYL) 2 MG tablet TAKE 1 TABLET BY MOUTH EVERY DAY BEFORE BREAKFAST 03/01/16  Yes [name] [name], DO  losartan-hydrochlorothiazide (HYZAAR) 100-25 MG tablet Take 1 tablet by mouth daily. 03/01/16  Yes Rosalita Chessman Chase, DO  Multiple Vitamin (MULTIVITAMIN) tablet Take 1 tablet by mouth daily.   Yes Historical Provider, MD  SitaGLIPtin-MetFORMIN HCl (JANUMET XR) [phone] MG TB24 Take 1 tablet by mouth daily. 03/30/16  Yes [name] [name], DO  sodium chloride (MURO 128) 5 % ophthalmic ointment Place 1 application into both eyes 3 (three) times daily.    Yes Historical Provider, MD  tamsulosin (FLOMAX) 0.4 MG CAPS capsule TAKE 1 CAPSULE (0.4 MG TOTAL) BY MOUTH DAILY. 03/01/16  Yes [name] [name], DO  HYDROcodone-acetaminophen (NORCO/VICODIN) 5-325 MG tablet  Take 1 tablet by mouth every 6 (six) hours as needed. 04/06/16   Merryl Hacker, MD    Family History Family History  Problem Relation Age of Onset  . Coronary artery disease Father 75    deceased  . Stomach cancer Mother 57    Social History Social History  Substance Use Topics  . Smoking status: Former Smoker    Packs/day: 1.00    Quit date: 06/07/1958  . Smokeless tobacco: Never Used  . Alcohol use No     Allergies   Review of patient's allergies indicates no known allergies.   Review of Systems Review of Systems  Constitutional: Negative for fever.  Respiratory: Negative for cough and shortness of breath.   Cardiovascular:  Positive for chest pain. Negative for leg swelling.  Gastrointestinal: Negative for abdominal pain.  Musculoskeletal: Negative for arthralgias.  All other systems reviewed and are negative.    Physical Exam Updated Vital Signs BP 145/67   Pulse 68   Temp 98.1 F (36.7 C) (Oral)   Resp 16   SpO2 95%   Physical Exam  Constitutional: He is oriented to person, place, and time. He appears well-developed and well-nourished.  Appears rather than stated age  HENT:  Head: Normocephalic and atraumatic.  Cardiovascular: Normal rate, regular rhythm and normal heart sounds.   No murmur heard. Pulmonary/Chest: Effort normal and breath sounds normal. No respiratory distress. He has no wheezes. He exhibits tenderness.  Point tenderness to palpation left anterior chest wall just lateral to the nipple, no crepitus or deformity noted, no overlying skin changes  Abdominal: Soft. There is no tenderness.  Musculoskeletal: He exhibits no edema or deformity.  Neurological: He is alert and oriented to person, place, and time.  Skin: Skin is warm and dry.  Psychiatric: He has a normal mood and affect.  Nursing note and vitals reviewed.    ED Treatments / Results  Labs (all labs ordered are listed, but only abnormal results are displayed) Labs Reviewed  I-STAT CHEM 8, ED - Abnormal; Notable for the following:       Result Value   BUN 28 (*)    Creatinine, Ser 1.30 (*)    Glucose, Bld 198 (*)    Calcium, Ion 1.13 (*)    Hemoglobin 12.6 (*)    HCT 37.0 (*)    All other components within normal limits    EKG  EKG Interpretation  Date/Time:  Tuesday April 06 2016 05:13:10 EDT Ventricular Rate:  71 PR Interval:    QRS Duration: 82 QT Interval:  371 QTC Calculation: 404 R Axis:   58 Text Interpretation:  Sinus rhythm Abnormal R-wave progression, early transition Confirmed by HORTON  MD, COURTNEY ([id number]) on 04/06/2016 5:45:02 AM       Radiology Dg Ribs Unilateral W/chest  Left  Result Date: 04/06/2016 CLINICAL DATA:  Left chest wall pain after falling 1 week ago. EXAM: LEFT RIBS AND CHEST - 3+ VIEW COMPARISON:  None. FINDINGS: No fracture or other bone lesions are seen involving the ribs. There is no evidence of pneumothorax or pleural effusion. Both lungs are clear except for minimal linear scarring or atelectasis in the left base. Heart size and mediastinal contours are within normal limits. IMPRESSION: Negative. Electronically Signed   By: Andreas Newport M.D.   On: 04/06/2016 03:29    Procedures Procedures (including critical care time)  Medications Ordered in ED Medications  HYDROcodone-acetaminophen (NORCO/VICODIN) 5-325 MG per tablet 1 tablet (1 tablet Oral Given 04/06/16  0506)     Initial Impression / Assessment and Plan / ED Course  I have reviewed the triage vital signs and the nursing notes.  Pertinent labs & imaging results that were available during my care of the patient were reviewed by me and considered in my medical decision making (see chart for details).  Clinical Course    Patient presents with chest pain. He relates this to a fall 1 week was. Worsening tonight after a forceful sneeze. He has reproducible tenderness on exam and pain that is worsened with movement and breathing. This is suggestive of musculoskeletal pain. EKG is reassuring. Chest x-ray shows no evidence of pneumothorax or rib fracture. Patient is otherwise nontoxic. He was given pain medication. Renal function is marginal. Not a good candidate for anti-inflammatories. Will provide a short course of pain medication and an incentive spirometer. We'll treat for chest wall contusion versus rib contusion or occult fracture. Follow-up with primary physician.  After history, exam, and medical workup I feel the patient has been appropriately medically screened and is safe for discharge home. Pertinent diagnoses were discussed with the patient. Patient was given return  precautions.   Final Clinical Impressions(s) / ED Diagnoses   Final diagnoses:  Chest wall pain    New Prescriptions New Prescriptions   HYDROCODONE-ACETAMINOPHEN (NORCO/VICODIN) 5-325 MG TABLET    Take 1 tablet by mouth every 6 (six) hours as needed.     Merryl Hacker, MD 04/06/16 [phone]

## 2016-04-06 NOTE — Discharge Instructions (Signed)
You were seen today for chest pain. This is likely related to your recent fall. There is no evidence of rib fracture on your chest x-ray; however, you may have bruised a rib or have a fracture that does not show up. Treated supportively. You are not a good candidate for ibuprofen given your renal function. He will be given a short course of Norco. You need to use the incentive spirometer to take big breaths to prevent collapse of your lung.  Follow-up with your primary doctor.

## 2016-04-06 NOTE — ED Triage Notes (Signed)
Pt. tripped and fell last week , reports pain at left lateral/anterior ribcage when he sneezed last night , pain increases with movement /chnaging positions /deep inspiration .

## 2016-04-19 ENCOUNTER — Telehealth: Payer: Self-pay | Admitting: Family Medicine

## 2016-04-19 NOTE — Telephone Encounter (Signed)
Relation to pt: self Call back number:[phone]   Reason for call:  Patient requesting a referral to Dr. Marilynne Halsted, MD ophthalmologist patient scheduled appointment for 05/05/16 at 8:10am due to consultation for blurry vision , please advise

## 2016-04-22 ENCOUNTER — Telehealth: Payer: Self-pay | Admitting: Family Medicine

## 2016-04-22 ENCOUNTER — Encounter (HOSPITAL_COMMUNITY): Payer: Self-pay | Admitting: Emergency Medicine

## 2016-04-22 ENCOUNTER — Emergency Department (HOSPITAL_COMMUNITY): Payer: Commercial Managed Care - HMO

## 2016-04-22 ENCOUNTER — Other Ambulatory Visit: Payer: Self-pay

## 2016-04-22 ENCOUNTER — Emergency Department (HOSPITAL_COMMUNITY)
Admission: EM | Admit: 2016-04-22 | Discharge: 2016-04-22 | Disposition: A | Discharge status: Home / Self Care | Payer: Commercial Managed Care - HMO | Attending: Emergency Medicine | Admitting: Emergency Medicine

## 2016-04-22 DIAGNOSIS — Z955 Presence of coronary angioplasty implant and graft: Secondary | ICD-10-CM | POA: Diagnosis not present

## 2016-04-22 DIAGNOSIS — E119 Type 2 diabetes mellitus without complications: Secondary | ICD-10-CM | POA: Diagnosis not present

## 2016-04-22 DIAGNOSIS — I2510 Atherosclerotic heart disease of native coronary artery without angina pectoris: Secondary | ICD-10-CM | POA: Insufficient documentation

## 2016-04-22 DIAGNOSIS — Z7984 Long term (current) use of oral hypoglycemic drugs: Secondary | ICD-10-CM | POA: Insufficient documentation

## 2016-04-22 DIAGNOSIS — R42 Dizziness and giddiness: Secondary | ICD-10-CM | POA: Insufficient documentation

## 2016-04-22 DIAGNOSIS — I10 Essential (primary) hypertension: Secondary | ICD-10-CM | POA: Insufficient documentation

## 2016-04-22 DIAGNOSIS — S0990XA Unspecified injury of head, initial encounter: Secondary | ICD-10-CM | POA: Diagnosis not present

## 2016-04-22 DIAGNOSIS — Z7982 Long term (current) use of aspirin: Secondary | ICD-10-CM | POA: Insufficient documentation

## 2016-04-22 DIAGNOSIS — Z87891 Personal history of nicotine dependence: Secondary | ICD-10-CM | POA: Insufficient documentation

## 2016-04-22 LAB — CBC
HEMATOCRIT: 37.9 % — AB (ref 39.0–52.0)
Hemoglobin: 12.9 g/dL — ABNORMAL LOW (ref 13.0–17.0)
MCH: 30.5 pg (ref 26.0–34.0)
MCHC: 34 g/dL (ref 30.0–36.0)
MCV: 89.6 fL (ref 78.0–100.0)
PLATELETS: 281 10*3/uL (ref 150–400)
RBC: 4.23 MIL/uL (ref 4.22–5.81)
RDW: 13.6 % (ref 11.5–15.5)
WBC: 10.5 10*3/uL (ref 4.0–10.5)

## 2016-04-22 LAB — BASIC METABOLIC PANEL
Anion gap: 11 (ref 5–15)
BUN: 22 mg/dL — AB (ref 6–20)
CHLORIDE: 104 mmol/L (ref 101–111)
CO2: 27 mmol/L (ref 22–32)
CREATININE: 1.4 mg/dL — AB (ref 0.61–1.24)
Calcium: 9.7 mg/dL (ref 8.9–10.3)
GFR calc Af Amer: 52 mL/min — ABNORMAL LOW (ref >60)
GFR calc non Af Amer: 45 mL/min — ABNORMAL LOW (ref >60)
Glucose, Bld: 143 mg/dL — ABNORMAL HIGH (ref 65–99)
POTASSIUM: 3.6 mmol/L (ref 3.5–5.1)
Sodium: 142 mmol/L (ref 135–145)

## 2016-04-22 LAB — I-STAT TROPONIN, ED: Troponin i, poc: 0 ng/mL (ref 0.00–0.08)

## 2016-04-22 MED ORDER — MECLIZINE HCL 25 MG PO TABS: 25.0000 mg | ORAL_TABLET | Freq: Three times a day (TID) | ORAL | 0 refills | Status: DC | PRN

## 2016-04-22 MED ORDER — MECLIZINE HCL 25 MG PO TABS
25.0000 mg | ORAL_TABLET | Freq: Once | ORAL | Status: AC
  Administered 2016-04-22: 25 mg via ORAL
  Filled 2016-04-22: qty 1

## 2016-04-22 NOTE — ED Provider Notes (Signed)
[facility] DEPT Provider Note   CSN: [id number] Arrival date & time: 04/22/16  1740     History   Chief Complaint Chief Complaint  Patient presents with  . Dizziness    HPI [name] is a 82 y.o. male with a hx of CAD, and DM, who is in great physical shape for his age, very active presents noting a history of intermittent positional vertigo since Monday. He states that his symptoms are a sensation of room spinning that lasts for anywhere from a few minutes to a few seconds when he turns his head certain ways or with position changes. He has a history of a mechanical fall on 10/31 during which he states that he may have hit his head, but not severely. He has had some associated mild nausea with it, but no vomiting. He denies any new focal weakness, numbness, visual deficits, headache, difficulty walking or speaking. He denies any other falls or head trauma. No hx of prior vertigo, cancer, or prior CVA. He has not taken anything for his symptoms, though he has been less active because of his sx.  HPI  Past Medical History:  Diagnosis Date  . CAD (coronary artery disease)    LHC 03/25/11 by Dr. Burt Knack:  pLAD 99%, oCFX 20-30%, pOM1 40%, dAVCFX 20-30%.  EF was normal on nuclear study.  He was treated with a Promus DES to his pLAD.   [name] Colon polyps 1996   villous adenoma  . DM type 2 (diabetes mellitus, type 2) ([place]) 2002  . Hyperlipidemia   . Hypertension     Patient Active Problem List   Diagnosis Date Noted  . Stress reaction 02/08/2015  . Grief reaction 02/04/2015  . BPH with urinary obstruction 12/13/2012  . CAD (coronary artery disease) 04/09/2011  . GASTRITIS 05/25/2010  . COLONIC POLYPS, ADENOMATOUS, HX OF 04/20/2010  . ANEMIA 10/27/2009  . DM II (diabetes mellitus, type II), controlled ([place]) 07/04/2006  . Hyperlipidemia 07/04/2006  . Essential hypertension 07/04/2006    Past Surgical History:  Procedure Laterality Date  . cataract    . CORONARY STENT  PLACEMENT         Home Medications    Prior to Admission medications   Medication Sig Start Date End Date Taking? Authorizing Provider  amLODipine (NORVASC) 5 MG tablet TAKE 1 TABLET (5 MG TOTAL) BY MOUTH DAILY. 03/01/16  Yes [name] [name], DO  aspirin 81 MG tablet Take 81 mg by mouth daily.   Yes Historical Provider, MD  atorvastatin (LIPITOR) 10 MG tablet Take 1 tablet (10 mg total) by mouth daily. REPEAT LABS ARE DUE NOW 03/01/16  Yes [name] [name], DO  brimonidine-timolol (COMBIGAN) 0.2-0.5 % ophthalmic solution Place 1 drop into the left eye 2 (two) times daily.   Yes Historical Provider, MD  fish oil-omega-3 fatty acids 1000 MG capsule Take 2 g by mouth daily.     Yes Historical Provider, MD  glimepiride (AMARYL) 2 MG tablet TAKE 1 TABLET BY MOUTH EVERY DAY BEFORE BREAKFAST 03/01/16  Yes [name] [name], DO  losartan-hydrochlorothiazide (HYZAAR) 100-25 MG tablet Take 1 tablet by mouth daily. 03/01/16  Yes Rosalita Chessman Chase, DO  Multiple Vitamin (MULTIVITAMIN) tablet Take 1 tablet by mouth daily.   Yes Historical Provider, MD  SitaGLIPtin-MetFORMIN HCl (JANUMET XR) [phone] MG TB24 Take 1 tablet by mouth daily. 03/30/16  Yes [name] [name], DO  sodium chloride (MURO 128) 5 % ophthalmic ointment Place 1 application  into both eyes 3 (three) times daily.    Yes Historical Provider, MD  tamsulosin (FLOMAX) 0.4 MG CAPS capsule TAKE 1 CAPSULE (0.4 MG TOTAL) BY MOUTH DAILY. 03/01/16  Yes [name] [name], DO  HYDROcodone-acetaminophen (NORCO/VICODIN) 5-325 MG tablet Take 1 tablet by mouth every 6 (six) hours as needed. Patient not taking: Reported on 04/22/2016 04/06/16   Merryl Hacker, MD  meclizine (ANTIVERT) 25 MG tablet Take 1 tablet (25 mg total) by mouth 3 (three) times daily as needed for dizziness. 04/22/16   Zenovia Jarred, DO    Family History Family History  Problem Relation Age of Onset  . Coronary artery disease Father 86    deceased  . Stomach  cancer Mother 47    Social History Social History  Substance Use Topics  . Smoking status: Former Smoker    Packs/day: 1.00    Quit date: 06/07/1958  . Smokeless tobacco: Never Used  . Alcohol use No     Allergies   Patient has no known allergies.   Review of Systems Review of Systems  Constitutional: Positive for activity change. Negative for chills and fever.  HENT: Negative for congestion, rhinorrhea, sinus pressure, sneezing and sore throat.   Eyes: Negative for photophobia, pain and visual disturbance.  Respiratory: Negative for cough, chest tightness and shortness of breath.   Cardiovascular: Negative for chest pain and palpitations.  Gastrointestinal: Positive for nausea. Negative for abdominal pain and vomiting.  Musculoskeletal: Negative for gait problem, neck pain and neck stiffness.  Neurological: Positive for dizziness. Negative for tremors, seizures, syncope, facial asymmetry, speech difficulty, weakness, light-headedness, numbness and headaches.  Psychiatric/Behavioral: Negative for confusion.     Physical Exam Updated Vital Signs BP 146/65   Pulse 61   Temp 97.9 F (36.6 C)   Resp 13   SpO2 96%   Physical Exam  Constitutional: He is oriented to person, place, and time. He appears well-developed and well-nourished. No distress.  HENT:  Head: Normocephalic and atraumatic.  Right Ear: External ear normal.  Left Ear: External ear normal.  Nose: Nose normal.  Mouth/Throat: Oropharynx is clear and moist.  Eyes: Conjunctivae and EOM are normal. Pupils are equal, round, and reactive to light.  Neck: Normal range of motion. Neck supple.  Cardiovascular: Normal rate, regular rhythm, normal heart sounds and intact distal pulses.   Pulmonary/Chest: Effort normal and breath sounds normal.  Abdominal: Soft. He exhibits no distension. There is no tenderness.  Musculoskeletal: He exhibits no edema or tenderness.  Neurological: He is alert and oriented to person,  place, and time. He has normal strength. He displays no tremor. No cranial nerve deficit or sensory deficit. He displays a negative Romberg sign. Coordination and gait normal. GCS eye subscore is 4. GCS verbal subscore is 5. GCS motor subscore is 6.  Positive dix hallpike on the right. Intact finger to nose and heel to shin.  Skin: Skin is warm and dry. He is not diaphoretic.  Nursing note and vitals reviewed.    ED Treatments / Results  Labs (all labs ordered are listed, but only abnormal results are displayed) Labs Reviewed  BASIC METABOLIC PANEL - Abnormal; Notable for the following:       Result Value   Glucose, Bld 143 (*)    BUN 22 (*)    Creatinine, Ser 1.40 (*)    GFR calc non Af Amer 45 (*)    GFR calc Af Amer 52 (*)    All other components  within normal limits  CBC - Abnormal; Notable for the following:    Hemoglobin 12.9 (*)    HCT 37.9 (*)    All other components within normal limits  I-STAT TROPOININ, ED    EKG  EKG Interpretation  Date/Time:  Thursday April 22 2016 17:47:31 EST Ventricular Rate:  70 PR Interval:  154 QRS Duration: 86 QT Interval:  380 QTC Calculation: 410 R Axis:   97 Text Interpretation:  Normal sinus rhythm Rightward axis Low voltage QRS Nonspecific ST abnormality Abnormal ECG No significant change since last tracing Confirmed by ALLEN  MD, ANTHONY ([id number]) on 04/22/2016 9:30:00 PM Also confirmed by Zenia Resides  MD, ANTHONY ([id number]), editor [place]  CCT, BEVERLY ([id number])  on 04/23/2016 9:05:56 AM       Radiology Ct Head Wo Contrast  Result Date: 04/22/2016 CLINICAL DATA:  82 y/o  M; dizziness and fall. EXAM: CT HEAD WITHOUT CONTRAST TECHNIQUE: Contiguous axial images were obtained from the base of the skull through the vertex without intravenous contrast. COMPARISON:  None. FINDINGS: Brain: No evidence of acute infarction, hemorrhage, hydrocephalus, extra-axial collection or mass lesion/mass effect. Mild chronic microvascular ischemic changes  and parenchymal volume loss. Vascular: No hyperdense vessel. Calcific atherosclerosis of carotid siphons. Skull: Normal. Negative for fracture or focal lesion. Sinuses/Orbits: Small right maxillary sinus mucous retention cyst. Otherwise the visualized paranasal sinuses and mastoid air cells are normally aerated. Bilateral intra-ocular lens replacement. Other: None. IMPRESSION: 1. No acute intracranial abnormality. 2. Mild chronic microvascular ischemic changes and parenchymal volume loss for age. Electronically Signed   By: Kristine Garbe M.D.   On: 04/22/2016 21:49    Procedures Procedures (including critical care time)  Medications Ordered in ED Medications  meclizine (ANTIVERT) tablet 25 mg (25 mg Oral Given 04/22/16 2152)     Initial Impression / Assessment and Plan / ED Course  I have reviewed the triage vital signs and the nursing notes.  Pertinent labs & imaging results that were available during my care of the patient were reviewed by me and considered in my medical decision making (see chart for details).  Clinical Course    82 y.o. male with a hx of fall about 2 weeks prior presents to the ED with vertigo sx. Exam as above with positive dix hallpike and reproducible vertigo with position changes, but no other neurologic deficits. CT head was done and showed no mass lesions or bleeding. Labs showed no significant abnormalities. He had significant improvement in his sx with meclizine and was rx'd the same. He was recommended to follow up with his PCP in a few days to monitor for improvement in his sx. Return precautions were given. This plan was discussed with the patient and his family at the bedside and they stated both understanding and agreement with this plan.  Final Clinical Impressions(s) / ED Diagnoses   Final diagnoses:  Vertigo    New Prescriptions Discharge Medication List as of 04/22/2016 10:54 PM    START taking these medications   Details  meclizine  (ANTIVERT) 25 MG tablet Take 1 tablet (25 mg total) by mouth 3 (three) times daily as needed for dizziness., Starting Thu 04/22/2016, [place], DO 04/23/16 2105

## 2016-04-22 NOTE — ED Notes (Signed)
Patient Alert and oriented X4. Stable and ambulatory. Patient verbalized understanding of the discharge instructions.  Patient belongings were taken by the patient.  

## 2016-04-22 NOTE — Telephone Encounter (Signed)
Patient Name: [name] DOB: May 11, 1932 Initial Comment Caller states his heart and pulse is skipping beats - not having any trouble breathing. Also feeling light headed. Nurse Assessment Nurse: Christel Mormon, RN, Levada Dy Date/Time Eilene Ghazi Time): 04/22/2016 4:30:05 PM Confirm and document reason for call. If symptomatic, describe symptoms. You must click the next button to save text entered. ---caller was seen in ER on 10/31 after falling/EKG was ok- He was then seen at a chiropractor last Monday - he was dizzy there after the manipulation, they had him lay there and gave him PO fluids and he felt better. He states he has been drinking water today. He states his heart is skipping beats- his palpable rate is 68-72- Unknown BP and no way to check it at home. Has the patient traveled out of the country within the last 30 days? ---No Does the patient have any new or worsening symptoms? ---Yes Will a triage be completed? ---Yes Related visit to physician within the last 2 weeks? ---No Does the PT have any chronic conditions? (i.e. diabetes, asthma, etc.) ---Yes List chronic conditions. ---enlarged prostate, high cholesterol, diabetes, HTN, cataract surgery Is this a behavioral health or substance abuse call? ---No Guidelines Guideline Title Affirmed Question Affirmed Notes Heart Rate and Heartbeat Questions Dizziness, lightheadedness, or weakness Final Disposition User Go to ED Now Christel Mormon, RN, [facility] - ED Disagree/Comply: Comply  Addendum not on original record-  He is to have son drive him to hospital and if he does not have immediate transportation, to call 911.

## 2016-04-22 NOTE — ED Notes (Signed)
Patient taken to CT.

## 2016-04-22 NOTE — Telephone Encounter (Signed)
Patient called stating that he is experiencing skipped heart beats. Transferred to [facility]. Spoke with Judson Roch

## 2016-04-22 NOTE — ED Provider Notes (Signed)
I saw and evaluated the patient, reviewed the resident's note and I agree with the findings and plan.   EKG Interpretation  Date/Time:  Thursday April 22 2016 17:47:31 EST Ventricular Rate:  70 PR Interval:  154 QRS Duration: 86 QT Interval:  380 QTC Calculation: 410 R Axis:   97 Text Interpretation:  Normal sinus rhythm Rightward axis Low voltage QRS Nonspecific ST abnormality Abnormal ECG No significant change since last tracing Confirmed by Shavawn Stobaugh  MD, Usama Harkless ([id number]) on 04/22/2016 9:30:24 PM     82 year old male presents with symptoms concerning for peripheral vertigo Responded well to [place].  no focal neurological findings. We'll discharge to home   Lacretia Leigh, MD 04/22/16 2251

## 2016-04-22 NOTE — ED Triage Notes (Signed)
Pt sts dizziness x 4 days worse upon position change and palpitations

## 2016-04-23 NOTE — Telephone Encounter (Signed)
Left message on pt's vm to give the office a call back. Calling to get update on pt's status on experience skipped heartbeat. LB

## 2016-04-23 NOTE — Telephone Encounter (Signed)
Pt seen in ED as directed.

## 2016-04-26 ENCOUNTER — Encounter: Payer: Self-pay | Admitting: Medical

## 2016-04-26 ENCOUNTER — Ambulatory Visit (HOSPITAL_BASED_OUTPATIENT_CLINIC_OR_DEPARTMENT_OTHER)
Admission: RE | Admit: 2016-04-26 | Discharge: 2016-04-26 | Disposition: A | Discharge status: Home / Self Care | Payer: Commercial Managed Care - HMO | Source: Ambulatory Visit | Attending: Medical | Admitting: Medical

## 2016-04-26 ENCOUNTER — Ambulatory Visit (INDEPENDENT_AMBULATORY_CARE_PROVIDER_SITE_OTHER): Payer: Commercial Managed Care - HMO | Admitting: Medical

## 2016-04-26 VITALS — BP 155/64 | HR 70 | Temp 98.0°F | Ht 64.0 in | Wt 134.8 lb

## 2016-04-26 DIAGNOSIS — R0989 Other specified symptoms and signs involving the circulatory and respiratory systems: Secondary | ICD-10-CM

## 2016-04-26 DIAGNOSIS — I10 Essential (primary) hypertension: Secondary | ICD-10-CM

## 2016-04-26 DIAGNOSIS — R42 Dizziness and giddiness: Secondary | ICD-10-CM | POA: Insufficient documentation

## 2016-04-26 DIAGNOSIS — I6523 Occlusion and stenosis of bilateral carotid arteries: Secondary | ICD-10-CM | POA: Insufficient documentation

## 2016-04-26 NOTE — Progress Notes (Signed)
Pre visit review using our clinic review tool, if applicable. No additional management support is needed unless otherwise documented below in the visit note. 

## 2016-04-26 NOTE — Progress Notes (Signed)
Subjective:    Patient ID: [name], male    DOB: 06/01/1932, 82 y.o.   MRN: [id number]  HPI  Pt in for follow up from the ED. Pt states that overall he feels better. He states he no longer feels vertigo. He feels little off balance for about 30 seconds usually when he stands then symptoms subside. Pt  States no dizziness or vertigo lying supine. Pt states he works out daily. He participates in senior games. Pt was evaluated in the ED the other day. He had a negative CT scan.   Pt states from Apr 19, 2016 to Apr 22, 2016 had  vertigo. But after meds symptoms improved great deal.   Pt states he feels about 90% better. Pt was given 25 mg of meclizine. He feels mild weak feeling with medicine.  Pt work up 4 days ago showed very minimal anemia. Pt has mild decrease cr and mild low gfr.  Pt states fall from Apr 06, 2016 showed neg xray for rib fracture.  Pt states at home his blood pressure is usually 110/66. Pt bp here is always high here. Pt checks with his machin at home. bp is better.  Pt last 2 ekg in ED were normal. Pt speculated that may he had irregular beat may have lead to dizziness.    Review of Systems  Constitutional: Negative for chills, fatigue and fever.  Respiratory: Negative for cough, chest tightness, shortness of breath and wheezing.   Cardiovascular: Negative for palpitations.  Gastrointestinal: Negative for abdominal pain, constipation, diarrhea, nausea and vomiting.  Musculoskeletal: Negative for back pain and myalgias.  Skin: Negative for pallor and rash.  Neurological: Negative for dizziness, speech difficulty, weakness, light-headedness, numbness and headaches.       On position change only recenlty.   Hematological: Negative for adenopathy. Does not bruise/bleed easily.  Psychiatric/Behavioral: Negative for behavioral problems, confusion, sleep disturbance and suicidal ideas. The patient is not nervous/anxious.     Past Medical History:  Diagnosis Date    . CAD (coronary artery disease)    LHC 03/25/11 by Dr. Burt Knack:  pLAD 99%, oCFX 20-30%, pOM1 40%, dAVCFX 20-30%.  EF was normal on nuclear study.  He was treated with a Promus DES to his pLAD.   [name] Colon polyps 1996   villous adenoma  . DM type 2 (diabetes mellitus, type 2) ([place]) 2002  . Hyperlipidemia   . Hypertension      Social History   Social History  . Marital status: Married    Spouse name: N/A  . Number of children: N/A  . Years of education: N/A   Occupational History  . Not on file.   Social History Main Topics  . Smoking status: Former Smoker    Packs/day: 1.00    Quit date: 06/07/1958  . Smokeless tobacco: Never Used  . Alcohol use No  . Drug use: No  . Sexual activity: Not on file   Other Topics Concern  . Not on file   Social History Narrative  . No narrative on file    Past Surgical History:  Procedure Laterality Date  . cataract    . CORONARY STENT PLACEMENT      Family History  Problem Relation Age of Onset  . Coronary artery disease Father 34    deceased  . Stomach cancer Mother 39    No Known Allergies  Current Outpatient Prescriptions on File Prior to Visit  Medication Sig Dispense Refill  . amLODipine (NORVASC)  5 MG tablet TAKE 1 TABLET (5 MG TOTAL) BY MOUTH DAILY. 30 tablet 5  . aspirin 81 MG tablet Take 81 mg by mouth daily.    [name] atorvastatin (LIPITOR) 10 MG tablet Take 1 tablet (10 mg total) by mouth daily. REPEAT LABS ARE DUE NOW 90 tablet 0  . brimonidine-timolol (COMBIGAN) 0.2-0.5 % ophthalmic solution Place 1 drop into the left eye 2 (two) times daily.    . fish oil-omega-3 fatty acids 1000 MG capsule Take 2 g by mouth daily.      [name] glimepiride (AMARYL) 2 MG tablet TAKE 1 TABLET BY MOUTH EVERY DAY BEFORE BREAKFAST 30 tablet 2  . HYDROcodone-acetaminophen (NORCO/VICODIN) 5-325 MG tablet Take 1 tablet by mouth every 6 (six) hours as needed. 15 tablet 0  . losartan-hydrochlorothiazide (HYZAAR) 100-25 MG tablet Take 1 tablet by mouth  daily. 90 tablet 3  . meclizine (ANTIVERT) 25 MG tablet Take 1 tablet (25 mg total) by mouth 3 (three) times daily as needed for dizziness. 30 tablet 0  . Multiple Vitamin (MULTIVITAMIN) tablet Take 1 tablet by mouth daily.    . SitaGLIPtin-MetFORMIN HCl (JANUMET XR) [phone] MG TB24 Take 1 tablet by mouth daily. 90 tablet 1  . sodium chloride (MURO 128) 5 % ophthalmic ointment Place 1 application into both eyes 3 (three) times daily.     . tamsulosin (FLOMAX) 0.4 MG CAPS capsule TAKE 1 CAPSULE (0.4 MG TOTAL) BY MOUTH DAILY. 90 capsule 3   No current facility-administered medications on file prior to visit.     BP (!) 155/64 (BP Location: Left Arm, Patient Position: Sitting, Cuff Size: Normal)   Pulse 70   Temp 98 F (36.7 C) (Oral)   Ht 5\' 4"  (1.626 m)   Wt 134 lb 12.8 oz (61.1 kg)   SpO2 100%   BMI 23.14 kg/m       Objective:   Physical Exam   General Mental Status- Alert. General Appearance- Not in acute distress.   Skin General: Color- Normal Color. Moisture- Normal Moisture.  Neck Carotid Arteries- Normal color. Moisture- Normal Moisture. Possible faint  carotid bruits bilaterally. No JVD.  Chest and Lung Exam Auscultation: Breath Sounds:-Normal.  Cardiovascular Auscultation:Rythm- Regular. Murmurs & Other Heart Sounds:Auscultation of the heart reveals- No Murmurs.  Abdomen Inspection:-Inspeection Normal. Palpation/Percussion:Note:No mass. Palpation and Percussion of the abdomen reveal- Non Tender, Non Distended + BS, no rebound or guarding.    Neurologic Cranial Nerve exam:- CN III-XII intact(No nystagmus), symmetric smile. Drift Test:- No drift. Romberg Exam:- Negative.  Heal to Toe Gait exam:-Normal. Finger to Nose:- Normal/Intact Strength:- 5/5 equal and symmetric strength both upper and lower extremities.     Assessment & Plan:  For your dizziness recently and reported much improved will advise you decrease you does of meclizine to 12.5 mg every 8  hours as needed for dizziness. But after 5 days recommend stop.  On exam think you may have some narrowed blood flow of carotids. Will get [place] to evaluated flow of neck vessels.(asked the pt to go down stairs and get the [place] scheduled)  Keep checking you bp to make sure it is not elevated as presently. You have normal good bp at home by your report.  Also if you do start to have vertigo on head turning will refer you to PT to head maneuvers.  If you get dizziness with any gross motor or sensory function deficits then ED evaluation.  Follow up in 7-10 days or as needed  Discussed with pt  that if in future he correlated dizzy sensation with irregular heart beats then would send for holter. Note recent ekg looked good.  Onyx Edgley, Percell Miller, PA-C

## 2016-04-26 NOTE — Patient Instructions (Addendum)
For your dizziness recently and reported much improved will advise you decrease you does of meclizine to 12.5 mg every 8 hours as needed for dizziness. But after 5 days recommend stop.  On exam think you may have some narrowed blood flow of carotids. Will get [place] to evaluated flow of neck vessels.  Keep checking you bp to make sure it is not elevated as presently. You have normal good bp at home by your report.  Also if you do start to have vertigo on head turning will refer you to PT to head maneuvers.  If you get dizziness with any gross motor or sensory function deficits then ED evaluation.  Follow up in 7-10 days or as needed

## 2016-05-04 ENCOUNTER — Other Ambulatory Visit: Payer: Self-pay | Admitting: Family Medicine

## 2016-05-04 ENCOUNTER — Other Ambulatory Visit: Payer: Self-pay | Admitting: *Deleted

## 2016-05-04 DIAGNOSIS — I10 Essential (primary) hypertension: Secondary | ICD-10-CM

## 2016-05-04 DIAGNOSIS — E1151 Type 2 diabetes mellitus with diabetic peripheral angiopathy without gangrene: Secondary | ICD-10-CM

## 2016-05-04 DIAGNOSIS — E785 Hyperlipidemia, unspecified: Secondary | ICD-10-CM

## 2016-05-04 MED ORDER — ATORVASTATIN CALCIUM 10 MG PO TABS
10.0000 mg | ORAL_TABLET | Freq: Every day | ORAL | 0 refills | Status: DC
Start: 2016-05-04 — End: 2016-12-13

## 2016-05-04 MED ORDER — AMLODIPINE BESYLATE 5 MG PO TABS: ORAL_TABLET | ORAL | 5 refills | Status: DC

## 2016-05-04 MED ORDER — LOSARTAN POTASSIUM-HCTZ 100-25 MG PO TABS: 1.0000 | ORAL_TABLET | Freq: Every day | ORAL | 3 refills | Status: DC

## 2016-05-04 MED ORDER — GLIMEPIRIDE 2 MG PO TABS
ORAL_TABLET | ORAL | 2 refills | Status: DC
Start: 2016-05-04 — End: 2016-11-27

## 2016-05-04 NOTE — Telephone Encounter (Signed)
All meds from 03/01/16 visit resent.

## 2016-05-06 ENCOUNTER — Encounter: Payer: Self-pay | Admitting: Medical

## 2016-05-06 ENCOUNTER — Ambulatory Visit (INDEPENDENT_AMBULATORY_CARE_PROVIDER_SITE_OTHER): Payer: Commercial Managed Care - HMO | Admitting: Medical

## 2016-05-06 VITALS — BP 104/64 | HR 73 | Temp 97.9°F | Ht 64.0 in | Wt 133.4 lb

## 2016-05-06 DIAGNOSIS — R42 Dizziness and giddiness: Secondary | ICD-10-CM | POA: Diagnosis not present

## 2016-05-06 NOTE — Progress Notes (Signed)
Pre visit review using our clinic review tool, if applicable. No additional management support is needed unless otherwise documented below in the visit note. 

## 2016-05-06 NOTE — Patient Instructions (Addendum)
Your dizziness/vertigo has improved now. For most part resolved for 5 days.  I do think safe to resume your exercise regimen. But limit to what you described to me today.  If you start to have room spinning with turning head supine again let me know and will refer to PT for head maneuver exercises.  If you get dizziness with neurologic signs or symptoms then ED evaluation.  Discussed you [place] results today and good cholesterol level recently. Continue diet and exercise. Plan to repeat US every 1-2 years. If worsening stenosis then will refer to vascular surgeon.   Follow up in march with pcp

## 2016-05-06 NOTE — Progress Notes (Signed)
Subjective:    Patient ID: [name], male    DOB: 05/04/1932, 82 y.o.   MRN: [id number]  HPI  Pt in for follow up.  Pt feels like he back to normal per his report. Pt states he did cut down the meclizine dose and used for additional 4 days since I last saw him. He states he felt weak with meclizine. Has been off meclizine for about 2 days and no longer feeling weak. Energy is back to normal.  Pt states about 5 days no episodes of any dizziness or vertigo. No gross motor or sensory function.  Pt like to exercise. He does some sit down yoga. Balance exercise and band exercises. Does not use machines.  Pt on review not having any palpitations. Pt states his bp at home 109/58 and 110/57. Pulse today at home 60-80.  Based on pt complaint last time I did order carotid US. Some stenosis seen. His lipid panel in September looked good.     Review of Systems  Constitutional: Negative for activity change and chills.  HENT: Negative for congestion.   Eyes: Negative for pain.  Respiratory: Negative for cough, chest tightness, shortness of breath and wheezing.   Cardiovascular: Negative for chest pain and palpitations.  Gastrointestinal: Negative for abdominal pain.  Genitourinary: Negative for dysuria, flank pain and frequency.  Musculoskeletal: Negative for back pain.  Skin: Negative for rash.  Neurological: Negative for dizziness and headaches.  Hematological: Negative for adenopathy. Does not bruise/bleed easily.  Psychiatric/Behavioral: Negative for behavioral problems, self-injury and suicidal ideas. The patient is not nervous/anxious.      Past Medical History:  Diagnosis Date  . CAD (coronary artery disease)    LHC 03/25/11 by Dr. Burt Knack:  pLAD 99%, oCFX 20-30%, pOM1 40%, dAVCFX 20-30%.  EF was normal on nuclear study.  He was treated with a Promus DES to his pLAD.   [name] Colon polyps 1996   villous adenoma  . DM type 2 (diabetes mellitus, type 2) ([place]) 2002  . Hyperlipidemia     . Hypertension      Social History   Social History  . Marital status: Married    Spouse name: N/A  . Number of children: N/A  . Years of education: N/A   Occupational History  . Not on file.   Social History Main Topics  . Smoking status: Former Smoker    Packs/day: 1.00    Quit date: 06/07/1958  . Smokeless tobacco: Never Used  . Alcohol use No  . Drug use: No  . Sexual activity: Not on file   Other Topics Concern  . Not on file   Social History Narrative  . No narrative on file    Past Surgical History:  Procedure Laterality Date  . cataract    . CORONARY STENT PLACEMENT      Family History  Problem Relation Age of Onset  . Coronary artery disease Father 32    deceased  . Stomach cancer Mother 33    No Known Allergies  Current Outpatient Prescriptions on File Prior to Visit  Medication Sig Dispense Refill  . amLODipine (NORVASC) 5 MG tablet TAKE 1 TABLET (5 MG TOTAL) BY MOUTH DAILY. 30 tablet 5  . aspirin 81 MG tablet Take 81 mg by mouth daily.    [name] atorvastatin (LIPITOR) 10 MG tablet Take 1 tablet (10 mg total) by mouth daily. REPEAT LABS ARE DUE NOW 90 tablet 0  . brimonidine-timolol (COMBIGAN) 0.2-0.5 % ophthalmic solution  Place 1 drop into the left eye 2 (two) times daily.    . fish oil-omega-3 fatty acids 1000 MG capsule Take 2 g by mouth daily.      [name] glimepiride (AMARYL) 2 MG tablet TAKE 1 TABLET BY MOUTH EVERY DAY BEFORE BREAKFAST 30 tablet 2  . HYDROcodone-acetaminophen (NORCO/VICODIN) 5-325 MG tablet Take 1 tablet by mouth every 6 (six) hours as needed. 15 tablet 0  . losartan-hydrochlorothiazide (HYZAAR) 100-25 MG tablet Take 1 tablet by mouth daily. 90 tablet 3  . Multiple Vitamin (MULTIVITAMIN) tablet Take 1 tablet by mouth daily.    . SitaGLIPtin-MetFORMIN HCl (JANUMET XR) [phone] MG TB24 Take 1 tablet by mouth daily. 90 tablet 1  . sodium chloride (MURO 128) 5 % ophthalmic ointment Place 1 application into both eyes 3 (three) times daily.      . tamsulosin (FLOMAX) 0.4 MG CAPS capsule TAKE 1 CAPSULE (0.4 MG TOTAL) BY MOUTH DAILY. 90 capsule 3   No current facility-administered medications on file prior to visit.     BP 104/64 (BP Location: Left Arm, Patient Position: Sitting, Cuff Size: Normal)   Pulse 73   Temp 97.9 F (36.6 C) (Oral)   Ht 5\' 4"  (1.626 m)   Wt 133 lb 6.4 oz (60.5 kg)   SpO2 97%   BMI 22.90 kg/m       Objective:   Physical Exam  General Mental Status- Alert. General Appearance- Not in acute distress.   Skin General: Color- Normal Color. Moisture- Normal Moisture.  Neck Carotid Arteries- Normal color. Moisture- Normal Moisture. No carotid bruits. No JVD.  Chest and Lung Exam Auscultation: Breath Sounds:-Normal.  Cardiovascular Auscultation:Rythm- Regular. Murmurs & Other Heart Sounds:Auscultation of the heart reveals- No Murmurs.  Abdomen Inspection:-Inspeection Normal. Palpation/Percussion:Note:No mass. Palpation and Percussion of the abdomen reveal- Non Tender, Non Distended + BS, no rebound or guarding.    Neurologic Cranial Nerve exam:- CN III-XII intact(No nystagmus), symmetric smile. Drift Test:- No drift. Romberg Exam:- Negative.  Heal to Toe Gait exam:-Normal(he does in classes)Fairly well today. Finger to Nose:- Normal/Intact Strength:- 5/5 equal and symmetric strength both upper and lower extremities. Only today layed supine very fast. 1 second dizzy then resolved quickly. On head turning left and rt no vertigo      Assessment & Plan:  Your dizziness/vertigo has improved now. For most part resolved for 5 days.  I do think safe to resume your exercise regimen. But limit to what you described to me today.  If you start to have room spinning with turning head supine again let me know and will refer to PT for head maneuver exercises.  If you get dizziness with neurologic signs or symptoms then ED evaluation.  Discussed you [place] results today and good cholesterol level  recently. Continue diet and exercise. Plan to repeat US every 1-2 years. If worsening stenosis then will refer to vascular surgeon.   Follow up in march with pcp

## 2016-08-18 DIAGNOSIS — H26492 Other secondary cataract, left eye: Secondary | ICD-10-CM | POA: Diagnosis not present

## 2016-08-18 DIAGNOSIS — H1851 Endothelial corneal dystrophy: Secondary | ICD-10-CM | POA: Diagnosis not present

## 2016-08-18 DIAGNOSIS — H18519 Endothelial corneal dystrophy, unspecified eye: Secondary | ICD-10-CM | POA: Insufficient documentation

## 2016-08-18 DIAGNOSIS — Z961 Presence of intraocular lens: Secondary | ICD-10-CM | POA: Diagnosis not present

## 2016-08-30 ENCOUNTER — Ambulatory Visit (INDEPENDENT_AMBULATORY_CARE_PROVIDER_SITE_OTHER): Payer: Medicare HMO | Admitting: Family Medicine

## 2016-08-30 ENCOUNTER — Encounter: Payer: Self-pay | Admitting: Family Medicine

## 2016-08-30 VITALS — BP 138/62 | HR 76 | Temp 97.6°F | Resp 16 | Ht 64.0 in | Wt 132.2 lb

## 2016-08-30 DIAGNOSIS — E782 Mixed hyperlipidemia: Secondary | ICD-10-CM | POA: Diagnosis not present

## 2016-08-30 DIAGNOSIS — E118 Type 2 diabetes mellitus with unspecified complications: Secondary | ICD-10-CM | POA: Diagnosis not present

## 2016-08-30 DIAGNOSIS — I10 Essential (primary) hypertension: Secondary | ICD-10-CM | POA: Diagnosis not present

## 2016-08-30 DIAGNOSIS — E119 Type 2 diabetes mellitus without complications: Secondary | ICD-10-CM | POA: Diagnosis not present

## 2016-08-30 DIAGNOSIS — N138 Other obstructive and reflux uropathy: Secondary | ICD-10-CM

## 2016-08-30 DIAGNOSIS — N401 Enlarged prostate with lower urinary tract symptoms: Secondary | ICD-10-CM

## 2016-08-30 LAB — CBC
HCT: 33.8 % — ABNORMAL LOW (ref 39.0–52.0)
HEMOGLOBIN: 11.1 g/dL — AB (ref 13.0–17.0)
MCHC: 32.7 g/dL (ref 30.0–36.0)
MCV: 84.9 fl (ref 78.0–100.0)
PLATELETS: 345 10*3/uL (ref 150.0–400.0)
RBC: 3.99 Mil/uL — AB (ref 4.22–5.81)
RDW: 14.6 % (ref 11.5–15.5)
WBC: 12.3 10*3/uL — ABNORMAL HIGH (ref 4.0–10.5)

## 2016-08-30 LAB — MICROALBUMIN / CREATININE URINE RATIO
CREATININE, U: 122.3 mg/dL
MICROALB UR: 4.6 mg/dL — AB (ref 0.0–1.9)
Microalb Creat Ratio: 3.8 mg/g (ref 0.0–30.0)

## 2016-08-30 LAB — COMPREHENSIVE METABOLIC PANEL
ALBUMIN: 3.8 g/dL (ref 3.5–5.2)
ALT: 9 U/L (ref 0–53)
AST: 10 U/L (ref 0–37)
Alkaline Phosphatase: 88 U/L (ref 39–117)
BILIRUBIN TOTAL: 0.5 mg/dL (ref 0.2–1.2)
BUN: 22 mg/dL (ref 6–23)
CALCIUM: 9.5 mg/dL (ref 8.4–10.5)
CHLORIDE: 103 meq/L (ref 96–112)
CO2: 31 mEq/L (ref 19–32)
CREATININE: 1.15 mg/dL (ref 0.40–1.50)
GFR: 64.62 mL/min (ref >60.00)
Glucose, Bld: 75 mg/dL (ref 70–99)
Potassium: 4.2 mEq/L (ref 3.5–5.1)
Sodium: 141 mEq/L (ref 135–145)
Total Protein: 6.8 g/dL (ref 6.0–8.3)

## 2016-08-30 LAB — POC URINALSYSI DIPSTICK (AUTOMATED)
Bilirubin, UA: NEGATIVE
Glucose, UA: NEGATIVE
Ketones, UA: NEGATIVE
Leukocytes, UA: NEGATIVE
Nitrite, UA: NEGATIVE
PROTEIN UA: NEGATIVE
RBC UA: NEGATIVE
SPEC GRAV UA: 1.02 (ref 1.030–1.035)
UROBILINOGEN UA: NEGATIVE (ref ?–2.0)
pH, UA: 6 (ref 5.0–8.0)

## 2016-08-30 LAB — LIPID PANEL
CHOLESTEROL: 121 mg/dL (ref 0–200)
HDL: 41.5 mg/dL (ref >39.00)
LDL CALC: 63 mg/dL (ref 0–99)
NonHDL: 79.99
TRIGLYCERIDES: 87 mg/dL (ref 0.0–149.0)
Total CHOL/HDL Ratio: 3
VLDL: 17.4 mg/dL (ref 0.0–40.0)

## 2016-08-30 LAB — PSA: PSA: 9.32 ng/mL — AB (ref 0.10–4.00)

## 2016-08-30 LAB — HEMOGLOBIN A1C: Hgb A1c MFr Bld: 7.1 % — ABNORMAL HIGH (ref 4.6–6.5)

## 2016-08-30 NOTE — Assessment & Plan Note (Signed)
Well controlled, no changes to meds. Encouraged heart healthy diet such as the DASH diet and exercise as tolerated.  °

## 2016-08-30 NOTE — Assessment & Plan Note (Signed)
Tolerating statin, encouraged heart healthy diet, avoid trans fats, minimize simple carbs and saturated fats. Increase exercise as tolerated 

## 2016-08-30 NOTE — Progress Notes (Signed)
Subjective:  I acted as a Education administrator for Dr. Royden Purl, LPN    Patient ID: [name], male    DOB: December 08, 1931, 83 y.o.   MRN: [id number]  Chief Complaint  Patient presents with  . Hypertension    follow up  . Diabetes    follow up  . Hyperlipidemia    Hypertension  This is a chronic problem. The current episode started more than 1 year ago. The problem is controlled. Pertinent negatives include no blurred vision, chest pain, headaches or palpitations.  Diabetes  He presents for his follow-up diabetic visit. He has type 2 diabetes mellitus. Pertinent negatives for hypoglycemia include no headaches. Pertinent negatives for diabetes include no blurred vision and no chest pain.    Patient is in today for follow up hypertension, hyperlipidemia, and diabetes. Patient report he does not eat sugar and he does not check his blood sugar at home. Patient report he is feeling well with no issues. Patient has no concerns at this time.  Patient Care Team: [name], DO as PCP - General (Family Medicine)   Past Medical History:  Diagnosis Date  . CAD (coronary artery disease)    LHC 03/25/11 by Dr. Burt Knack:  pLAD 99%, oCFX 20-30%, pOM1 40%, dAVCFX 20-30%.  EF was normal on nuclear study.  He was treated with a Promus DES to his pLAD.   [name] Colon polyps 1996   villous adenoma  . DM type 2 (diabetes mellitus, type 2) ([place]) 2002  . Hyperlipidemia   . Hypertension     Past Surgical History:  Procedure Laterality Date  . cataract    . CORONARY STENT PLACEMENT      Family History  Problem Relation Age of Onset  . Coronary artery disease Father 83    deceased  . Stomach cancer Mother 36    Social History   Social History  . Marital status: Married    Spouse name: N/A  . Number of children: N/A  . Years of education: N/A   Occupational History  . Not on file.   Social History Main Topics  . Smoking status: Former Smoker    Packs/day: 1.00    Quit date: 06/07/1958    . Smokeless tobacco: Never Used  . Alcohol use No  . Drug use: No  . Sexual activity: Not on file   Other Topics Concern  . Not on file   Social History Narrative  . No narrative on file    Outpatient Medications Prior to Visit  Medication Sig Dispense Refill  . amLODipine (NORVASC) 5 MG tablet TAKE 1 TABLET (5 MG TOTAL) BY MOUTH DAILY. 30 tablet 5  . aspirin 81 MG tablet Take 81 mg by mouth daily.    [name] atorvastatin (LIPITOR) 10 MG tablet Take 1 tablet (10 mg total) by mouth daily. REPEAT LABS ARE DUE NOW 90 tablet 0  . brimonidine-timolol (COMBIGAN) 0.2-0.5 % ophthalmic solution Place 1 drop into the left eye 2 (two) times daily.    . fish oil-omega-3 fatty acids 1000 MG capsule Take 2 g by mouth daily.      [name] glimepiride (AMARYL) 2 MG tablet TAKE 1 TABLET BY MOUTH EVERY DAY BEFORE BREAKFAST 30 tablet 2  . HYDROcodone-acetaminophen (NORCO/VICODIN) 5-325 MG tablet Take 1 tablet by mouth every 6 (six) hours as needed. 15 tablet 0  . losartan-hydrochlorothiazide (HYZAAR) 100-25 MG tablet Take 1 tablet by mouth daily. 90 tablet 3  . Multiple Vitamin (MULTIVITAMIN) tablet  Take 1 tablet by mouth daily.    . SitaGLIPtin-MetFORMIN HCl (JANUMET XR) [phone] MG TB24 Take 1 tablet by mouth daily. 90 tablet 1  . sodium chloride (MURO 128) 5 % ophthalmic ointment Place 1 application into both eyes 3 (three) times daily.     . tamsulosin (FLOMAX) 0.4 MG CAPS capsule TAKE 1 CAPSULE (0.4 MG TOTAL) BY MOUTH DAILY. 90 capsule 3   No facility-administered medications prior to visit.     No Known Allergies  Review of Systems  Constitutional: Negative for fever.  HENT: Negative for congestion.   Eyes: Negative for blurred vision.  Respiratory: Negative for cough.   Cardiovascular: Negative for chest pain and palpitations.  Gastrointestinal: Negative for vomiting.  Musculoskeletal: Negative for back pain.  Skin: Negative for rash.  Neurological: Negative for loss of consciousness and headaches.        Objective:    Physical Exam  BP 138/62 (BP Location: Right Arm, Patient Position: Sitting, Cuff Size: Normal)   Pulse 76   Temp 97.6 F (36.4 C) (Oral)   Resp 16   Ht 5\' 4"  (1.626 m)   Wt 132 lb 3.2 oz (60 kg)   SpO2 97%   BMI 22.69 kg/m  Wt Readings from Last 3 Encounters:  08/30/16 132 lb 3.2 oz (60 kg)  05/06/16 133 lb 6.4 oz (60.5 kg)  04/26/16 134 lb 12.8 oz (61.1 kg)   BP Readings from Last 3 Encounters:  08/30/16 138/62  05/06/16 104/64  04/26/16 (!) 155/64     Immunization History  Administered Date(s) Administered  . Influenza Split 03/05/2011, 04/24/2012  . Influenza Whole 02/27/2008, 03/04/2010  . Influenza,inj,quad, With Preservative 03/07/2013  . Influenza-Unspecified 03/08/2015  . Pneumococcal Conjugate-13 08/26/2014  . Pneumococcal Polysaccharide-23 02/27/2008    Health Maintenance  Topic Date Due  . TETANUS/TDAP  02/03/1953  . OPHTHALMOLOGY EXAM  08/27/2016  . HEMOGLOBIN A1C  08/29/2016  . INFLUENZA VACCINE  09/04/2016 (Originally 01/06/2016)  . FOOT EXAM  03/01/2017  . PNA vac Low Risk Adult  Completed    Lab Results  Component Value Date   WBC 10.5 04/22/2016   HGB 12.9 (L) 04/22/2016   HCT 37.9 (L) 04/22/2016   PLT 281 04/22/2016   GLUCOSE 143 (H) 04/22/2016   CHOL 132 03/01/2016   TRIG 108.0 03/01/2016   HDL 45.00 03/01/2016   LDLCALC 65 03/01/2016   ALT 14 03/01/2016   AST 16 03/01/2016   NA 142 04/22/2016   K 3.6 04/22/2016   CL 104 04/22/2016   CREATININE 1.40 (H) 04/22/2016   BUN 22 (H) 04/22/2016   CO2 27 04/22/2016   TSH 2.47 10/20/2009   PSA 3.74 02/28/2014   INR 0.97 03/25/2011   HGBA1C 6.7 (H) 03/01/2016   MICROALBUR 25.9 (H) 02/04/2015    Lab Results  Component Value Date   TSH 2.47 10/20/2009   Lab Results  Component Value Date   WBC 10.5 04/22/2016   HGB 12.9 (L) 04/22/2016   HCT 37.9 (L) 04/22/2016   MCV 89.6 04/22/2016   PLT 281 04/22/2016   Lab Results  Component Value Date   NA 142  04/22/2016   K 3.6 04/22/2016   CO2 27 04/22/2016   GLUCOSE 143 (H) 04/22/2016   BUN 22 (H) 04/22/2016   CREATININE 1.40 (H) 04/22/2016   BILITOT 0.7 03/01/2016   ALKPHOS 77 03/01/2016   AST 16 03/01/2016   ALT 14 03/01/2016   PROT 7.3 03/01/2016   ALBUMIN 4.0 03/01/2016  CALCIUM 9.7 04/22/2016   ANIONGAP 11 04/22/2016   GFR 58.22 (L) 03/01/2016   Lab Results  Component Value Date   CHOL 132 03/01/2016   Lab Results  Component Value Date   HDL 45.00 03/01/2016   Lab Results  Component Value Date   LDLCALC 65 03/01/2016   Lab Results  Component Value Date   TRIG 108.0 03/01/2016   Lab Results  Component Value Date   CHOLHDL 3 03/01/2016   Lab Results  Component Value Date   HGBA1C 6.7 (H) 03/01/2016         Assessment & Plan:   Problem List Items Addressed This Visit      Unprioritized   BPH with urinary obstruction   Relevant Orders   PSA   DM II (diabetes mellitus, type II), controlled ([place])    .hgba1c acceptable, minimize simple carbs. Increase exercise as tolerated. Continue current meds       Relevant Orders   Hemoglobin A1c   Microalbumin / creatinine urine ratio   POCT Urinalysis Dipstick (Automated)   Hemoglobin A1c   Microalbumin / creatinine urine ratio   POCT Urinalysis Dipstick (Automated)   Essential hypertension - Primary    Well controlled, no changes to meds. Encouraged heart healthy diet such as the DASH diet and exercise as tolerated.       Relevant Orders   CBC   Comprehensive metabolic panel   POCT Urinalysis Dipstick (Automated)   Hyperlipidemia    Tolerating statin, encouraged heart healthy diet, avoid trans fats, minimize simple carbs and saturated fats. Increase exercise as tolerated      Relevant Orders   Lipid panel   POCT Urinalysis Dipstick (Automated)      I am having Mr. Cogburn maintain his fish oil-omega-3 fatty acids, aspirin, sodium chloride, multivitamin, brimonidine-timolol, SitaGLIPtin-MetFORMIN HCl,  HYDROcodone-acetaminophen, tamsulosin, losartan-hydrochlorothiazide, amLODipine, glimepiride, and atorvastatin.  No orders of the defined types were placed in this encounter.   CMA served as Education administrator during this visit. History, Physical and Plan performed by medical provider. Documentation and orders reviewed and attested to.  [name], DO   Patient ID: [name], male   DOB: 1931-09-16, 83 y.o.   MRN: [id number]

## 2016-08-30 NOTE — Progress Notes (Signed)
Pre visit review using our clinic review tool, if applicable. No additional management support is needed unless otherwise documented below in the visit note. 

## 2016-08-30 NOTE — Patient Instructions (Signed)

## 2016-08-30 NOTE — Assessment & Plan Note (Signed)
hgba1c acceptable, minimize simple carbs. Increase exercise as tolerated. Continue current meds 

## 2016-08-31 ENCOUNTER — Other Ambulatory Visit: Payer: Self-pay | Admitting: Family Medicine

## 2016-08-31 ENCOUNTER — Telehealth: Payer: Self-pay | Admitting: *Deleted

## 2016-08-31 DIAGNOSIS — K625 Hemorrhage of anus and rectum: Secondary | ICD-10-CM

## 2016-08-31 DIAGNOSIS — R972 Elevated prostate specific antigen [PSA]: Secondary | ICD-10-CM

## 2016-08-31 DIAGNOSIS — N138 Other obstructive and reflux uropathy: Secondary | ICD-10-CM

## 2016-08-31 DIAGNOSIS — N401 Enlarged prostate with lower urinary tract symptoms: Principal | ICD-10-CM

## 2016-08-31 NOTE — Telephone Encounter (Signed)
We sent an add on to the lab to add on urine culture and per Heloise Purp[facility] lab could not add that on.  Would you like for him to come back in to recollect?

## 2016-08-31 NOTE — Telephone Encounter (Signed)
Called patient left another message. He already picked up stool cards.  I asked on vm if he could come and leave a urine for [place].

## 2016-08-31 NOTE — Telephone Encounter (Signed)
Left detailed message for him to leave urine speciman when he comes pick up his stool cards.  Future order placed for urine culture.

## 2016-08-31 NOTE — Telephone Encounter (Signed)
Yes -- he is supposed to pick up I fob we could have him just leave urine then

## 2016-09-07 ENCOUNTER — Other Ambulatory Visit: Payer: Medicare HMO

## 2016-09-07 DIAGNOSIS — N401 Enlarged prostate with lower urinary tract symptoms: Secondary | ICD-10-CM | POA: Diagnosis not present

## 2016-09-07 DIAGNOSIS — N138 Other obstructive and reflux uropathy: Secondary | ICD-10-CM | POA: Diagnosis not present

## 2016-09-08 LAB — URINE CULTURE: Organism ID, Bacteria: NO GROWTH

## 2016-09-17 ENCOUNTER — Other Ambulatory Visit (INDEPENDENT_AMBULATORY_CARE_PROVIDER_SITE_OTHER): Payer: Medicare HMO

## 2016-09-17 DIAGNOSIS — K625 Hemorrhage of anus and rectum: Secondary | ICD-10-CM | POA: Diagnosis not present

## 2016-09-17 LAB — FECAL OCCULT BLOOD, IMMUNOCHEMICAL: Fecal Occult Bld: NEGATIVE

## 2016-11-11 DIAGNOSIS — I255 Ischemic cardiomyopathy: Secondary | ICD-10-CM | POA: Diagnosis not present

## 2016-11-11 DIAGNOSIS — I2510 Atherosclerotic heart disease of native coronary artery without angina pectoris: Secondary | ICD-10-CM | POA: Diagnosis not present

## 2016-11-11 DIAGNOSIS — H1851 Endothelial corneal dystrophy: Secondary | ICD-10-CM | POA: Diagnosis not present

## 2016-11-11 DIAGNOSIS — I10 Essential (primary) hypertension: Secondary | ICD-10-CM | POA: Diagnosis not present

## 2016-11-11 DIAGNOSIS — Z87891 Personal history of nicotine dependence: Secondary | ICD-10-CM | POA: Diagnosis not present

## 2016-11-15 DIAGNOSIS — H26492 Other secondary cataract, left eye: Secondary | ICD-10-CM | POA: Diagnosis not present

## 2016-11-15 DIAGNOSIS — I2510 Atherosclerotic heart disease of native coronary artery without angina pectoris: Secondary | ICD-10-CM | POA: Diagnosis not present

## 2016-11-15 DIAGNOSIS — Z79899 Other long term (current) drug therapy: Secondary | ICD-10-CM | POA: Diagnosis not present

## 2016-11-15 DIAGNOSIS — Z48810 Encounter for surgical aftercare following surgery on the sense organs: Secondary | ICD-10-CM | POA: Diagnosis not present

## 2016-11-15 DIAGNOSIS — H1851 Endothelial corneal dystrophy: Secondary | ICD-10-CM | POA: Diagnosis not present

## 2016-11-15 DIAGNOSIS — I10 Essential (primary) hypertension: Secondary | ICD-10-CM | POA: Diagnosis not present

## 2016-11-15 DIAGNOSIS — E119 Type 2 diabetes mellitus without complications: Secondary | ICD-10-CM | POA: Diagnosis not present

## 2016-11-15 DIAGNOSIS — Z7984 Long term (current) use of oral hypoglycemic drugs: Secondary | ICD-10-CM | POA: Diagnosis not present

## 2016-11-15 DIAGNOSIS — Z7982 Long term (current) use of aspirin: Secondary | ICD-10-CM | POA: Diagnosis not present

## 2016-11-27 ENCOUNTER — Other Ambulatory Visit: Payer: Self-pay | Admitting: Family Medicine

## 2016-11-27 DIAGNOSIS — E1151 Type 2 diabetes mellitus with diabetic peripheral angiopathy without gangrene: Secondary | ICD-10-CM

## 2016-12-13 ENCOUNTER — Other Ambulatory Visit: Payer: Self-pay | Admitting: Family Medicine

## 2016-12-13 DIAGNOSIS — E785 Hyperlipidemia, unspecified: Secondary | ICD-10-CM

## 2016-12-13 DIAGNOSIS — N402 Nodular prostate without lower urinary tract symptoms: Secondary | ICD-10-CM | POA: Diagnosis not present

## 2016-12-13 DIAGNOSIS — R972 Elevated prostate specific antigen [PSA]: Secondary | ICD-10-CM | POA: Diagnosis not present

## 2016-12-27 DIAGNOSIS — Z961 Presence of intraocular lens: Secondary | ICD-10-CM | POA: Diagnosis not present

## 2016-12-27 DIAGNOSIS — H1851 Endothelial corneal dystrophy: Secondary | ICD-10-CM | POA: Diagnosis not present

## 2016-12-27 DIAGNOSIS — Z947 Corneal transplant status: Secondary | ICD-10-CM | POA: Diagnosis not present

## 2016-12-27 DIAGNOSIS — H26492 Other secondary cataract, left eye: Secondary | ICD-10-CM | POA: Diagnosis not present

## 2017-01-10 ENCOUNTER — Other Ambulatory Visit: Payer: Self-pay | Admitting: Family Medicine

## 2017-01-10 DIAGNOSIS — I10 Essential (primary) hypertension: Secondary | ICD-10-CM

## 2017-01-25 DIAGNOSIS — R972 Elevated prostate specific antigen [PSA]: Secondary | ICD-10-CM | POA: Diagnosis not present

## 2017-01-26 DIAGNOSIS — H401134 Primary open-angle glaucoma, bilateral, indeterminate stage: Secondary | ICD-10-CM | POA: Diagnosis not present

## 2017-02-03 DIAGNOSIS — C61 Malignant neoplasm of prostate: Secondary | ICD-10-CM | POA: Diagnosis not present

## 2017-02-09 ENCOUNTER — Encounter: Payer: Self-pay | Admitting: Urology

## 2017-02-10 DIAGNOSIS — I10 Essential (primary) hypertension: Secondary | ICD-10-CM | POA: Diagnosis not present

## 2017-02-10 DIAGNOSIS — Z7984 Long term (current) use of oral hypoglycemic drugs: Secondary | ICD-10-CM | POA: Diagnosis not present

## 2017-02-10 DIAGNOSIS — E119 Type 2 diabetes mellitus without complications: Secondary | ICD-10-CM | POA: Diagnosis not present

## 2017-02-10 DIAGNOSIS — Z87891 Personal history of nicotine dependence: Secondary | ICD-10-CM | POA: Diagnosis not present

## 2017-02-10 DIAGNOSIS — I255 Ischemic cardiomyopathy: Secondary | ICD-10-CM | POA: Diagnosis not present

## 2017-02-10 DIAGNOSIS — I2510 Atherosclerotic heart disease of native coronary artery without angina pectoris: Secondary | ICD-10-CM | POA: Diagnosis not present

## 2017-02-10 DIAGNOSIS — H1851 Endothelial corneal dystrophy: Secondary | ICD-10-CM | POA: Diagnosis not present

## 2017-02-11 ENCOUNTER — Other Ambulatory Visit: Payer: Self-pay | Admitting: Family Medicine

## 2017-02-11 DIAGNOSIS — E1151 Type 2 diabetes mellitus with diabetic peripheral angiopathy without gangrene: Secondary | ICD-10-CM

## 2017-02-15 ENCOUNTER — Encounter: Payer: Self-pay | Admitting: Radiation Oncology

## 2017-02-15 DIAGNOSIS — C61 Malignant neoplasm of prostate: Secondary | ICD-10-CM

## 2017-02-15 HISTORY — DX: Malignant neoplasm of prostate: C61

## 2017-02-15 NOTE — Progress Notes (Signed)
GU Location of Tumor / Histology: prostatic adenocarcinoma  If Prostate Cancer, Gleason Score is (3 + 4) and PSA is (9.32). Prostate volume: 33.72 cc.   [name] was referred by Dr. Garnet Koyanagi July 2018 to Dr. Karsten Ro for evaluation of elevated PSA.   Biopsies of prostate revealed:   Past/Anticipated interventions by urology, if any: biopsy, referral to radiation oncology  Past/Anticipated interventions by medical oncology, if any: None  Weight changes, if any: no  Bowel/Bladder complaints, if any: IPSS 5. Denies dysuria, hematuria, urinary leakage or incontinence.   Nausea/Vomiting, if any: no  Pain issues, if any:  no  SAFETY ISSUES:  Prior radiation? no  Pacemaker/ICD? no  Possible current pregnancy? no  Is the patient on methotrexate? no  Current Complaints / other details:  83 year old male. Widowed with three sons. Very active going to the gym 5 days a week. NKDA. Mother-hx of stomach cancer.

## 2017-02-16 ENCOUNTER — Ambulatory Visit
Admission: RE | Admit: 2017-02-16 | Discharge: 2017-02-16 | Disposition: A | Discharge status: Home / Self Care | Payer: Medicare HMO | Source: Ambulatory Visit | Attending: Radiation Oncology | Admitting: Radiation Oncology

## 2017-02-16 ENCOUNTER — Encounter: Payer: Self-pay | Admitting: Radiation Oncology

## 2017-02-16 DIAGNOSIS — Z510 Encounter for antineoplastic radiation therapy: Secondary | ICD-10-CM | POA: Insufficient documentation

## 2017-02-16 DIAGNOSIS — Z86010 Personal history of colonic polyps: Secondary | ICD-10-CM | POA: Insufficient documentation

## 2017-02-16 DIAGNOSIS — I10 Essential (primary) hypertension: Secondary | ICD-10-CM | POA: Diagnosis not present

## 2017-02-16 DIAGNOSIS — C61 Malignant neoplasm of prostate: Secondary | ICD-10-CM | POA: Insufficient documentation

## 2017-02-16 DIAGNOSIS — Z87891 Personal history of nicotine dependence: Secondary | ICD-10-CM | POA: Diagnosis not present

## 2017-02-16 DIAGNOSIS — E785 Hyperlipidemia, unspecified: Secondary | ICD-10-CM | POA: Insufficient documentation

## 2017-02-16 DIAGNOSIS — Z7984 Long term (current) use of oral hypoglycemic drugs: Secondary | ICD-10-CM | POA: Diagnosis not present

## 2017-02-16 DIAGNOSIS — E119 Type 2 diabetes mellitus without complications: Secondary | ICD-10-CM | POA: Diagnosis not present

## 2017-02-16 DIAGNOSIS — Z7982 Long term (current) use of aspirin: Secondary | ICD-10-CM | POA: Diagnosis not present

## 2017-02-16 DIAGNOSIS — Z9889 Other specified postprocedural states: Secondary | ICD-10-CM | POA: Insufficient documentation

## 2017-02-16 DIAGNOSIS — Z79899 Other long term (current) drug therapy: Secondary | ICD-10-CM | POA: Diagnosis not present

## 2017-02-16 DIAGNOSIS — R972 Elevated prostate specific antigen [PSA]: Secondary | ICD-10-CM | POA: Diagnosis not present

## 2017-02-16 DIAGNOSIS — Z955 Presence of coronary angioplasty implant and graft: Secondary | ICD-10-CM | POA: Insufficient documentation

## 2017-02-16 DIAGNOSIS — I2510 Atherosclerotic heart disease of native coronary artery without angina pectoris: Secondary | ICD-10-CM | POA: Diagnosis not present

## 2017-02-16 HISTORY — DX: Malignant neoplasm of prostate: C61

## 2017-02-16 NOTE — Progress Notes (Signed)
Radiation Oncology         [phone] ________________________________  Initial Outpatient Consultation  Name: [name] MRN: [id number]  Date: 02/16/2017  DOB: 02-Oct-1931  YK:DXIPJ Chase, Alferd Apa, DO  Kathie Rhodes, MD   REFERRING PHYSICIAN: Kathie Rhodes, MD  DIAGNOSIS: 83 y.o. gentleman with Stage T2c adenocarcinoma of the prostate with Gleason Score of 3+4, and PSA of 9.32    ICD-10-CM   1. Malignant neoplasm of prostate ([place]) C61     HISTORY OF PRESENT ILLNESS: [name] is an 83 y.o. male with a diagnosis of prostate cancer. He was noted to have an elevated PSA of 9.32 on 08/30/2016 by his primary care physician, Dr. Garnet Koyanagi.  Accordingly, he was referred for evaluation in urology by Dr. Karsten Ro on 12/13/2016, where a digital rectal examination was performed at that time revealing firm nodularity at the left apex as well as a smaller, rubbery nodule at the right base. The patient proceeded to transrectal ultrasound with 12 biopsies of the prostate on 01/25/2017. The prostate volume measured 33.72 cc. Out of 12 core biopsies, 7 were positive.  The maximum Gleason score was 3+4, and this was seen in the left base, left base lateral, and right mid lateral.  Additionally, there was Gleason 3+3=6 disease in the left mid, left mid lateral, left apex lateral, and right apex lateral.  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   The patient reviewed the biopsy results with his urologist and he has kindly been referred today for discussion of potential radiation treatment options.   PREVIOUS RADIATION THERAPY: No  PAST MEDICAL HISTORY:  Past Medical History:  Diagnosis Date  . CAD (coronary artery disease)    LHC 03/25/11 by Dr.  Burt Knack:  pLAD 99%, oCFX 20-30%, pOM1 40%, dAVCFX 20-30%.  EF was normal on nuclear study.  He was treated with a Promus DES to his pLAD.   [name] Colon polyps 1996   villous adenoma  . DM type 2 (diabetes mellitus, type 2) ([place]) 2002  . Hyperlipidemia   . Hypertension   . Prostate cancer ([place])       PAST SURGICAL HISTORY: Past Surgical History:  Procedure Laterality Date  . cataract    . CORONARY STENT PLACEMENT      FAMILY HISTORY:  Family History  Problem Relation Age of Onset  . Coronary artery disease Father 43       deceased  . Stomach cancer Mother 28    SOCIAL HISTORY:  Social History   Social History  . Marital status: Married    Spouse name: N/A  . Number of children: N/A  . Years of education: N/A   Occupational History  . Not on file.   Social History Main Topics  . Smoking status: Former Smoker    Packs/day: 0.25    Years: 3.00    Quit date: 06/07/1958  . Smokeless tobacco: Never Used  . Alcohol use No  . Drug use: No  . Sexual activity: Not Currently   Other Topics Concern  . Not on file   Social History Narrative  . No narrative on file    ALLERGIES: Patient has no known allergies.  MEDICATIONS:  Current Outpatient Prescriptions  Medication Sig Dispense Refill  .  amLODipine (NORVASC) 5 MG tablet TAKE 1 TABLET (5 MG TOTAL) BY MOUTH DAILY. 30 tablet 5  . aspirin 81 MG tablet Take 81 mg by mouth daily.    [name] atorvastatin (LIPITOR) 10 MG tablet TAKE 1 TABLET BY MOUTH DAILY (*REPEAT LABS ARE DUE*) 90 tablet 0  . brimonidine-timolol (COMBIGAN) 0.2-0.5 % ophthalmic solution Place 1 drop into the left eye 2 (two) times daily.    . fish oil-omega-3 fatty acids 1000 MG capsule Take 2 g by mouth daily.      [name] glimepiride (AMARYL) 2 MG tablet TAKE 1 TABLET BY MOUTH EVERY DAY BEFORE BREAKFAST 30 tablet 1  . losartan-hydrochlorothiazide (HYZAAR) 100-25 MG tablet Take 1 tablet by mouth daily. 90 tablet 3  . Multiple Vitamin (MULTIVITAMIN) tablet Take 1 tablet  by mouth daily.    . prednisoLONE acetate (PRED FORTE) 1 % ophthalmic suspension 1 drop 4 (four) times daily.    . SitaGLIPtin-MetFORMIN HCl (JANUMET XR) [phone] MG TB24 Take 1 tablet by mouth daily. 90 tablet 1  . tamsulosin (FLOMAX) 0.4 MG CAPS capsule TAKE 1 CAPSULE (0.4 MG TOTAL) BY MOUTH DAILY. 90 capsule 3  . HYDROcodone-acetaminophen (NORCO/VICODIN) 5-325 MG tablet Take 1 tablet by mouth every 6 (six) hours as needed. (Patient not taking: Reported on 02/16/2017) 15 tablet 0  . sodium chloride (MURO 128) 5 % ophthalmic ointment Place 1 application into both eyes 3 (three) times daily.      No current facility-administered medications for this encounter.     REVIEW OF SYSTEMS:  On review of systems, the patient reports that he is doing well overall. He denies any chest pain, shortness of breath, cough, fevers, chills, night sweats, or unintended weight changes. He denies any bowel disturbances, and denies abdominal pain, nausea or vomiting. He denies any new musculoskeletal or joint aches or pains. His IPSS was 5, indicating mild urinary symptoms, managed with Flomax. He denies dysuria, hematuria, urinary leakage or incontinence. He is not currently sexually active. A complete review of systems is obtained and is otherwise negative.    PHYSICAL EXAM:  Wt Readings from Last 3 Encounters:  02/16/17 127 lb 6.4 oz (57.8 kg)  08/30/16 132 lb 3.2 oz (60 kg)  05/06/16 133 lb 6.4 oz (60.5 kg)   Temp Readings from Last 3 Encounters:  02/16/17 98 F (36.7 C) (Oral)  08/30/16 97.6 F (36.4 C) (Oral)  05/06/16 97.9 F (36.6 C) (Oral)   BP Readings from Last 3 Encounters:  02/16/17 (!) 167/64  08/30/16 138/62  05/06/16 104/64   Pulse Readings from Last 3 Encounters:  02/16/17 70  08/30/16 76  05/06/16 73   Pain Assessment Pain Score: 0-No pain/10  In general this is a well appearing Caucasian male in no acute distress. He is alert and oriented x4 and appropriate throughout the  examination. HEENT reveals that the patient is normocephalic, atraumatic. Cardiovascular exam reveals a regular rate and rhythm, no clicks rubs or murmurs are auscultated. Chest is clear to auscultation bilaterally. Lymphatic assessment is performed and does not reveal any adenopathy in the cervical, supraclavicular, axillary, or inguinal chains. Abdomen has active bowel sounds in all quadrants and is intact. The abdomen is soft, non tender, non distended. Lower extremities are negative for pretibial pitting edema, deep calf tenderness, cyanosis or clubbing.   KPS = 100  100 - Normal; no complaints; no evidence of disease. 90   - Able to carry on normal activity; minor signs or symptoms of disease. 80   -  Normal activity with effort; some signs or symptoms of disease. 6   - Cares for self; unable to carry on normal activity or to do active work. 60   - Requires occasional assistance, but is able to care for most of his personal needs. 50   - Requires considerable assistance and frequent medical care. 43   - Disabled; requires special care and assistance. 74   - Severely disabled; hospital admission is indicated although death not imminent. 30   - Very sick; hospital admission necessary; active supportive treatment necessary. 10   - Moribund; fatal processes progressing rapidly. 0     - Dead  Karnofsky DA, Abelmann [place], Craver LS and Burchenal [facility] [phone]) The use of the nitrogen mustards in the palliative treatment of carcinoma: with particular reference to bronchogenic carcinoma Cancer 1 634-56  LABORATORY DATA:  Lab Results  Component Value Date   WBC 12.3 (H) 08/30/2016   HGB 11.1 (L) 08/30/2016   HCT 33.8 (L) 08/30/2016   MCV 84.9 08/30/2016   PLT 345.0 08/30/2016   Lab Results  Component Value Date   NA 141 08/30/2016   K 4.2 08/30/2016   CL 103 08/30/2016   CO2 31 08/30/2016   Lab Results  Component Value Date   ALT 9 08/30/2016   AST 10 08/30/2016   ALKPHOS 88 08/30/2016    BILITOT 0.5 08/30/2016     RADIOGRAPHY: No results found.    IMPRESSION/PLAN: 1. 83 y.o. gentleman with Stage T2c adenocarcinoma of the prostate with Gleason Score of 3+4, and PSA of 9.32.  We discussed the patient's workup and outlined the nature of prostate cancer in this setting. The patient's Gleason's score puts him into the intermediate risk group. Accordingly, he is eligible for a variety of potential treatment options including brachytherapy or 8 weeks of external radiation. We discussed the available radiation techniques, and focused on the details of logistics and delivery. We discussed and outlined the risks, benefits, short and long-term effects associated with radiotherapy and compared and contrasted these with prostatectomy. Given his age, the patient would not be an ideal candidate for prostatectomy and has expressed interest in prostate brachytherapy. We discussed the benefits of employing SpaceOAR at the time of brachytherapy to spare the rectum from the toxicities associated with radiation.  At the conclusion of our conversation, the patient is interested in moving forward with brachytherapy and use of SpaceOAR to reduce rectal toxicity from radiotherapy.  We will share our discussion with Dr. Karsten Ro and move forward with scheduling this procedure as well as a Pubic Arch CT [facility] planning appointment in the near future.  The patient met briefly with Romie Jumper in our office who will be working closely with him to coordinate OR scheduling and pre and post procedure appointments.  We will contact the pharmaceutical rep to ensure that [place] is available at the time of procedure.  He will have a prostate MRI following his post-seed CT SIM to confirm appropriate distribution of the [place].   We enjoyed meeting with him today and will look forward to participating in the care of this very nice gentleman.  We spent 60 minutes face to face with the patient and more than 50% of that  time was spent in counseling and/or coordination of care.    Nicholos Johns, PA-C    Tyler Pita, MD  [facility] Direct Dial: [phone]  Fax: [phone] [email or web address]  Skype  LinkedIn  This document serves as a record  of services personally performed by Tyler Pita, MD and Freeman Caldron, PA-C. It was created on their behalf by Rae Lips, a trained medical scribe. The creation of this record is based on the scribe's personal observations and the providers' statements to them. This document has been checked and approved by the attending providers.

## 2017-02-16 NOTE — Progress Notes (Signed)
See progress note under physician encounter. 

## 2017-02-17 ENCOUNTER — Telehealth: Payer: Self-pay | Admitting: *Deleted

## 2017-02-17 ENCOUNTER — Other Ambulatory Visit: Payer: Self-pay | Admitting: Urology

## 2017-02-17 NOTE — Telephone Encounter (Signed)
Called patient to inform of pre-seed planning for 03-11-17 @ 10 am, spoke with patient and he is aware of this appt.

## 2017-02-17 NOTE — Telephone Encounter (Signed)
Called patient to inform of implant date on 05-06-17 @ 7:30 am, spoke with patient and he is aware of this implant

## 2017-02-25 ENCOUNTER — Telehealth: Payer: Self-pay | Admitting: Medical Oncology

## 2017-02-25 NOTE — Telephone Encounter (Signed)
Spoke with [name] to introduce myself as the prostate nurse navigator. I was unable to meet him the day he consulted with Dr. Tammi Klippel. He states that he has dates for CT simulation and seed implant. We discussed the CT and pre-surgical labs. All questions were answered and I asked him to call me with questions or concerns. I look forward to meeting him on 03/11/17.

## 2017-03-02 DIAGNOSIS — H1851 Endothelial corneal dystrophy: Secondary | ICD-10-CM | POA: Diagnosis not present

## 2017-03-02 DIAGNOSIS — Z947 Corneal transplant status: Secondary | ICD-10-CM | POA: Diagnosis not present

## 2017-03-02 DIAGNOSIS — Z961 Presence of intraocular lens: Secondary | ICD-10-CM | POA: Diagnosis not present

## 2017-03-02 DIAGNOSIS — Z9842 Cataract extraction status, left eye: Secondary | ICD-10-CM | POA: Diagnosis not present

## 2017-03-04 ENCOUNTER — Encounter: Payer: Medicare HMO | Admitting: Family Medicine

## 2017-03-10 ENCOUNTER — Telehealth: Payer: Self-pay | Admitting: *Deleted

## 2017-03-10 NOTE — Telephone Encounter (Signed)
CALLED PATIENT TO REMIND OF PRE-SEED APPTS. ON 03-11-17, CONFIRMED  APPTS. WITH PATIENT.

## 2017-03-11 ENCOUNTER — Encounter: Payer: Self-pay | Admitting: Medical Oncology

## 2017-03-11 ENCOUNTER — Ambulatory Visit
Admission: RE | Admit: 2017-03-11 | Discharge: 2017-03-11 | Disposition: A | Discharge status: Home / Self Care | Payer: Medicare HMO | Source: Ambulatory Visit | Attending: Radiation Oncology | Admitting: Radiation Oncology

## 2017-03-11 ENCOUNTER — Other Ambulatory Visit: Payer: Self-pay

## 2017-03-11 ENCOUNTER — Encounter (HOSPITAL_BASED_OUTPATIENT_CLINIC_OR_DEPARTMENT_OTHER)
Admission: RE | Admit: 2017-03-11 | Discharge: 2017-03-11 | Disposition: A | Discharge status: Home / Self Care | Payer: Medicare HMO | Source: Ambulatory Visit | Attending: Urology | Admitting: Urology

## 2017-03-11 ENCOUNTER — Ambulatory Visit (HOSPITAL_BASED_OUTPATIENT_CLINIC_OR_DEPARTMENT_OTHER)
Admission: RE | Admit: 2017-03-11 | Discharge: 2017-03-11 | Disposition: A | Discharge status: Home / Self Care | Payer: Medicare HMO | Source: Ambulatory Visit | Attending: Urology | Admitting: Urology

## 2017-03-11 DIAGNOSIS — C61 Malignant neoplasm of prostate: Secondary | ICD-10-CM

## 2017-03-11 DIAGNOSIS — I2510 Atherosclerotic heart disease of native coronary artery without angina pectoris: Secondary | ICD-10-CM | POA: Diagnosis not present

## 2017-03-11 DIAGNOSIS — Z86010 Personal history of colonic polyps: Secondary | ICD-10-CM | POA: Diagnosis not present

## 2017-03-11 DIAGNOSIS — R918 Other nonspecific abnormal finding of lung field: Secondary | ICD-10-CM | POA: Insufficient documentation

## 2017-03-11 DIAGNOSIS — I700 Atherosclerosis of aorta: Secondary | ICD-10-CM | POA: Insufficient documentation

## 2017-03-11 DIAGNOSIS — E119 Type 2 diabetes mellitus without complications: Secondary | ICD-10-CM | POA: Diagnosis not present

## 2017-03-11 DIAGNOSIS — Z955 Presence of coronary angioplasty implant and graft: Secondary | ICD-10-CM | POA: Diagnosis not present

## 2017-03-11 DIAGNOSIS — Z510 Encounter for antineoplastic radiation therapy: Secondary | ICD-10-CM | POA: Diagnosis not present

## 2017-03-11 DIAGNOSIS — E785 Hyperlipidemia, unspecified: Secondary | ICD-10-CM | POA: Diagnosis not present

## 2017-03-11 DIAGNOSIS — Z9889 Other specified postprocedural states: Secondary | ICD-10-CM | POA: Diagnosis not present

## 2017-03-11 DIAGNOSIS — Z01810 Encounter for preprocedural cardiovascular examination: Secondary | ICD-10-CM | POA: Diagnosis not present

## 2017-03-11 DIAGNOSIS — I10 Essential (primary) hypertension: Secondary | ICD-10-CM | POA: Diagnosis not present

## 2017-03-11 NOTE — Progress Notes (Signed)
[name] here for pre-seed CT. Per Dr. Tammi Klippel he is a great candidate for the procedure. Details of procedure discussed with patient by Ashlyn, PA. All questions were answered. He is scheduled for brachytherapy 05/06/17. I asked him to call if questions or concerns. He voiced understanding.

## 2017-03-11 NOTE — Progress Notes (Signed)
  Radiation Oncology         [phone] ________________________________  Name: [name] MRN: [id number]  Date: 03/11/2017  DOB: 02-19-1932  SIMULATION AND TREATMENT PLANNING NOTE PUBIC ARCH STUDY  ON:GEXBM Koren Shiver, DO  Kathie Rhodes, MD  DIAGNOSIS: 83 y.o. gentleman with Stage T2c adenocarcinoma of the prostate with Gleason Score of 3+4, and PSA of 9.32    ICD-10-CM   1. Malignant neoplasm of prostate ([place]) C61     COMPLEX SIMULATION:  The patient presented today for evaluation for possible prostate seed implant. He was brought to the radiation planning suite and placed supine on the CT couch. A 3-dimensional image study set was obtained in upload to the planning computer. There, on each axial slice, I contoured the prostate gland. Then, using three-dimensional radiation planning tools I reconstructed the prostate in view of the structures from the transperineal needle pathway to assess for possible pubic arch interference. In doing so, I did not appreciate any pubic arch interference. Also, the patient's prostate volume was estimated based on the drawn structure. The volume was 26.5 cc.  Given the pubic arch appearance and prostate volume, patient remains a good candidate to proceed with prostate seed implant. Today, he freely provided informed written consent to proceed.    PLAN: The patient will undergo prostate seed implant.   ________________________________  Sheral Apley. Tammi Klippel, M.D.  This document serves as a record of services personally performed by Tyler Pita, MD. It was created on his behalf by Rae Lips, a trained medical scribe. The creation of this record is based on the scribe's personal observations and the provider's statements to them. This document has been checked and approved by the attending provider.

## 2017-03-12 ENCOUNTER — Other Ambulatory Visit: Payer: Self-pay | Admitting: Family Medicine

## 2017-03-12 DIAGNOSIS — E785 Hyperlipidemia, unspecified: Secondary | ICD-10-CM

## 2017-03-15 ENCOUNTER — Ambulatory Visit (HOSPITAL_COMMUNITY): Admission: RE | Admit: 2017-03-15 | Payer: Medicare HMO | Source: Ambulatory Visit

## 2017-03-22 ENCOUNTER — Telehealth: Payer: Self-pay | Admitting: *Deleted

## 2017-03-22 NOTE — Telephone Encounter (Signed)
Called patient to inform that MRI has been moved to 05-27-17, because it needs to be done after his implant, spoke with patient and he is aware of this

## 2017-03-23 ENCOUNTER — Ambulatory Visit (HOSPITAL_COMMUNITY): Admission: RE | Admit: 2017-03-23 | Payer: Medicare HMO | Source: Ambulatory Visit

## 2017-04-01 ENCOUNTER — Encounter: Payer: Self-pay | Admitting: Family Medicine

## 2017-04-01 ENCOUNTER — Ambulatory Visit (INDEPENDENT_AMBULATORY_CARE_PROVIDER_SITE_OTHER): Payer: Medicare HMO | Admitting: Family Medicine

## 2017-04-01 VITALS — BP 140/78 | HR 67 | Temp 97.7°F | Ht 64.0 in | Wt 126.0 lb

## 2017-04-01 DIAGNOSIS — C61 Malignant neoplasm of prostate: Secondary | ICD-10-CM

## 2017-04-01 DIAGNOSIS — I10 Essential (primary) hypertension: Secondary | ICD-10-CM

## 2017-04-01 DIAGNOSIS — E782 Mixed hyperlipidemia: Secondary | ICD-10-CM

## 2017-04-01 DIAGNOSIS — E785 Hyperlipidemia, unspecified: Secondary | ICD-10-CM | POA: Diagnosis not present

## 2017-04-01 DIAGNOSIS — E1136 Type 2 diabetes mellitus with diabetic cataract: Secondary | ICD-10-CM | POA: Diagnosis not present

## 2017-04-01 DIAGNOSIS — Z23 Encounter for immunization: Secondary | ICD-10-CM

## 2017-04-01 DIAGNOSIS — Z0000 Encounter for general adult medical examination without abnormal findings: Secondary | ICD-10-CM | POA: Diagnosis not present

## 2017-04-01 LAB — POC URINALSYSI DIPSTICK (AUTOMATED)
Bilirubin, UA: NEGATIVE
Glucose, UA: NEGATIVE
Ketones, UA: NEGATIVE
Leukocytes, UA: NEGATIVE
Nitrite, UA: NEGATIVE
PH UA: 6 (ref 5.0–8.0)
RBC UA: NEGATIVE
Spec Grav, UA: 1.02 (ref 1.010–1.025)
UROBILINOGEN UA: 0.2 U/dL

## 2017-04-01 LAB — CBC WITH DIFFERENTIAL/PLATELET
BASOS ABS: 0.1 10*3/uL (ref 0.0–0.1)
Basophils Relative: 0.9 % (ref 0.0–3.0)
Eosinophils Absolute: 0.5 10*3/uL (ref 0.0–0.7)
Eosinophils Relative: 4.6 % (ref 0.0–5.0)
HEMATOCRIT: 39.9 % (ref 39.0–52.0)
HEMOGLOBIN: 13.1 g/dL (ref 13.0–17.0)
Lymphocytes Relative: 25.8 % (ref 12.0–46.0)
Lymphs Abs: 2.9 10*3/uL (ref 0.7–4.0)
MCHC: 32.9 g/dL (ref 30.0–36.0)
MCV: 88.4 fl (ref 78.0–100.0)
MONOS PCT: 10 % (ref 3.0–12.0)
Monocytes Absolute: 1.1 10*3/uL — ABNORMAL HIGH (ref 0.1–1.0)
NEUTROS PCT: 58.7 % (ref 43.0–77.0)
Neutro Abs: 6.5 10*3/uL (ref 1.4–7.7)
Platelets: 267 10*3/uL (ref 150.0–400.0)
RBC: 4.51 Mil/uL (ref 4.22–5.81)
RDW: 15.7 % — ABNORMAL HIGH (ref 11.5–15.5)
WBC: 11.1 10*3/uL — AB (ref 4.0–10.5)

## 2017-04-01 LAB — COMPREHENSIVE METABOLIC PANEL
ALK PHOS: 84 U/L (ref 39–117)
ALT: 13 U/L (ref 0–53)
AST: 14 U/L (ref 0–37)
Albumin: 3.9 g/dL (ref 3.5–5.2)
BILIRUBIN TOTAL: 0.7 mg/dL (ref 0.2–1.2)
BUN: 17 mg/dL (ref 6–23)
CO2: 33 mEq/L — ABNORMAL HIGH (ref 19–32)
Calcium: 9.5 mg/dL (ref 8.4–10.5)
Chloride: 102 mEq/L (ref 96–112)
Creatinine, Ser: 1.1 mg/dL (ref 0.40–1.50)
GFR: 67.92 mL/min (ref >60.00)
Glucose, Bld: 139 mg/dL — ABNORMAL HIGH (ref 70–99)
Potassium: 3.6 mEq/L (ref 3.5–5.1)
Sodium: 141 mEq/L (ref 135–145)
TOTAL PROTEIN: 6.7 g/dL (ref 6.0–8.3)

## 2017-04-01 LAB — LIPID PANEL
CHOL/HDL RATIO: 3
Cholesterol: 161 mg/dL (ref 0–200)
HDL: 47.6 mg/dL (ref >39.00)
LDL CALC: 83 mg/dL (ref 0–99)
NONHDL: 113.81
Triglycerides: 152 mg/dL — ABNORMAL HIGH (ref 0.0–149.0)
VLDL: 30.4 mg/dL (ref 0.0–40.0)

## 2017-04-01 LAB — HEMOGLOBIN A1C: HEMOGLOBIN A1C: 7.3 % — AB (ref 4.6–6.5)

## 2017-04-01 NOTE — Assessment & Plan Note (Signed)
Per u rology 

## 2017-04-01 NOTE — Assessment & Plan Note (Signed)
See avs ghm utd  Check labs 

## 2017-04-01 NOTE — Assessment & Plan Note (Signed)
Tolerating statin, encouraged heart healthy diet, avoid trans fats, minimize simple carbs and saturated fats. Increase exercise as tolerated 

## 2017-04-01 NOTE — Progress Notes (Signed)
Patient ID: [name], male    DOB: 1931/06/13  Age: 83 y.o. MRN: [id number]    Subjective:  Subjective  HPI [name] presents for cpe.  No complaints    Review of Systems  Constitutional: Negative.   HENT: Negative for congestion, ear pain, hearing loss, nosebleeds, postnasal drip, rhinorrhea, sinus pressure, sneezing and tinnitus.   Eyes: Negative for photophobia, discharge, itching and visual disturbance.  Respiratory: Negative.   Cardiovascular: Negative.   Gastrointestinal: Negative for abdominal distention, abdominal pain, anal bleeding, blood in stool and constipation.  Endocrine: Negative.   Genitourinary: Negative.   Musculoskeletal: Negative.   Skin: Negative.   Allergic/Immunologic: Negative.   Neurological: Negative for dizziness, weakness, light-headedness, numbness and headaches.  Psychiatric/Behavioral: Negative for agitation, confusion, decreased concentration, dysphoric mood, sleep disturbance and suicidal ideas. The patient is not nervous/anxious.     History Past Medical History:  Diagnosis Date  . CAD (coronary artery disease)    LHC 03/25/11 by Dr. Burt Knack:  pLAD 99%, oCFX 20-30%, pOM1 40%, dAVCFX 20-30%.  EF was normal on nuclear study.  He was treated with a Promus DES to his pLAD.   [name] Colon polyps 1996   villous adenoma  . DM type 2 (diabetes mellitus, type 2) ([place]) 2002  . Hyperlipidemia   . Hypertension   . Prostate cancer [facility])     He has a past surgical history that includes cataract and Coronary stent placement.   His family history includes Coronary artery disease (age of onset: 85) in his father; Stomach cancer (age of onset: 47) in his mother.He reports that he quit smoking about 58 years ago. He has a 0.75 pack-year smoking history. He has never used smokeless tobacco. He reports that he does not drink alcohol or use drugs.  Current Outpatient Prescriptions on File Prior to Visit  Medication Sig Dispense Refill  . amLODipine (NORVASC) 5  MG tablet TAKE 1 TABLET (5 MG TOTAL) BY MOUTH DAILY. 30 tablet 5  . aspirin 81 MG tablet Take 81 mg by mouth daily.    [name] atorvastatin (LIPITOR) 10 MG tablet TAKE 1 TABLET BY MOUTH DAILY (*REPEAT LABS ARE DUE*) 90 tablet 0  . brimonidine-timolol (COMBIGAN) 0.2-0.5 % ophthalmic solution Place 1 drop into the left eye 2 (two) times daily.    . fish oil-omega-3 fatty acids 1000 MG capsule Take 2 g by mouth daily.      [name] glimepiride (AMARYL) 2 MG tablet TAKE 1 TABLET BY MOUTH EVERY DAY BEFORE BREAKFAST 30 tablet 1  . HYDROcodone-acetaminophen (NORCO/VICODIN) 5-325 MG tablet Take 1 tablet by mouth every 6 (six) hours as needed. 15 tablet 0  . losartan-hydrochlorothiazide (HYZAAR) 100-25 MG tablet Take 1 tablet by mouth daily. 90 tablet 3  . Multiple Vitamin (MULTIVITAMIN) tablet Take 1 tablet by mouth daily.    . prednisoLONE acetate (PRED FORTE) 1 % ophthalmic suspension 1 drop 4 (four) times daily.    . SitaGLIPtin-MetFORMIN HCl (JANUMET XR) [phone] MG TB24 Take 1 tablet by mouth daily. 90 tablet 1  . sodium chloride (MURO 128) 5 % ophthalmic ointment Place 1 application into both eyes 3 (three) times daily.     . tamsulosin (FLOMAX) 0.4 MG CAPS capsule TAKE 1 CAPSULE (0.4 MG TOTAL) BY MOUTH DAILY. 90 capsule 3   No current facility-administered medications on file prior to visit.      Objective:  Objective  Physical Exam  Constitutional: He is oriented to person, place, and time. Vital signs are normal.  He appears well-developed and well-nourished. He is sleeping. No distress.  HENT:  Head: Normocephalic and atraumatic.  Right Ear: External ear normal.  Left Ear: External ear normal.  Nose: Nose normal.  Mouth/Throat: Oropharynx is clear and moist. No oropharyngeal exudate.  Eyes: Pupils are equal, round, and reactive to light. Conjunctivae and EOM are normal. Right eye exhibits no discharge. Left eye exhibits no discharge.  Neck: Normal range of motion. Neck supple. No JVD present. No  thyromegaly present.  Cardiovascular: Normal rate, regular rhythm and intact distal pulses.  Exam reveals no gallop and no friction rub.   No murmur heard. Pulmonary/Chest: Effort normal and breath sounds normal. No respiratory distress. He has no wheezes. He has no rales. He exhibits no tenderness.  Abdominal: Soft. Bowel sounds are normal. He exhibits no distension and no mass. There is no tenderness. There is no rebound and no guarding.  Musculoskeletal: Normal range of motion. He exhibits no edema or tenderness.  Lymphadenopathy:    He has no cervical adenopathy.  Neurological: He is alert and oriented to person, place, and time. He displays normal reflexes. He exhibits normal muscle tone.  Skin: Skin is warm and dry. No rash noted. He is not diaphoretic. No erythema. No pallor.  Psychiatric: He has a normal mood and affect. His behavior is normal. Judgment and thought content normal.  Nursing note and vitals reviewed. gu-- per urology Sensory exam of the foot is normal, tested with the monofilament. Good pulses, no lesions or ulcers, good peripheral pulses.  BP 140/78   Pulse 67   Temp 97.7 F (36.5 C) (Oral)   Ht 5\' 4"  (1.626 m)   Wt 126 lb (57.2 kg)   SpO2 97%   BMI 21.63 kg/m  Wt Readings from Last 3 Encounters:  04/01/17 126 lb (57.2 kg)  02/16/17 127 lb 6.4 oz (57.8 kg)  08/30/16 132 lb 3.2 oz (60 kg)     Lab Results  Component Value Date   WBC 11.1 (H) 04/01/2017   HGB 13.1 04/01/2017   HCT 39.9 04/01/2017   PLT 267.0 04/01/2017   GLUCOSE 139 (H) 04/01/2017   CHOL 161 04/01/2017   TRIG 152.0 (H) 04/01/2017   HDL 47.60 04/01/2017   LDLCALC 83 04/01/2017   ALT 13 04/01/2017   AST 14 04/01/2017   NA 141 04/01/2017   K 3.6 04/01/2017   CL 102 04/01/2017   CREATININE 1.10 04/01/2017   BUN 17 04/01/2017   CO2 33 (H) 04/01/2017   TSH 2.47 10/20/2009   PSA 9.32 (H) 08/30/2016   INR 0.97 03/25/2011   HGBA1C 7.3 (H) 04/01/2017   MICROALBUR 4.6 (H) 08/30/2016     Dg Chest 2 View  Result Date: 03/11/2017 CLINICAL DATA:  Preoperative evaluation.  No current complaints. EXAM: CHEST  2 VIEW COMPARISON:  Prior radiograph from 04/06/2016. FINDINGS: Cardiac and mediastinal silhouettes are stable in size and contour, and remain within normal limits. Cardiac stent noted. Aortic atherosclerosis. Elevation of the left hemidiaphragm, similar to previous, likely chronic. There are hazy and patchy bibasilar opacities, favored to reflect atelectasis/ bronchovascular crowding, although possible infiltrates could be considered in the correct clinical setting. No other focal airspace disease. No pulmonary edema or pleural effusion. No pneumothorax. No acute osseous abnormality. Scoliosis with multilevel degenerative spondylolysis noted within the visualized spine. IMPRESSION: 1. Chronic elevation of the left hemidiaphragm with patchy and hazy bibasilar opacities. Subsegmental atelectasis is favored, although infiltrates could be considered in the correct clinical setting. 2. No  other active cardiopulmonary disease. 3. Aortic atherosclerosis. Electronically Signed   By: Jeannine Boga M.D.   On: 03/11/2017 15:58     Assessment & Plan:  Plan  I am having Mr. [place] maintain his fish oil-omega-3 fatty acids, aspirin, sodium chloride, multivitamin, brimonidine-timolol, SitaGLIPtin-MetFORMIN HCl, HYDROcodone-acetaminophen, tamsulosin, losartan-hydrochlorothiazide, amLODipine, glimepiride, prednisoLONE acetate, and atorvastatin.  No orders of the defined types were placed in this encounter.   Problem List Items Addressed This Visit      Unprioritized   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, without long-term current use of insulin ([place])    hgba1c to be checked, minimize simple carbs. Increase exercise as tolerated. Continue current meds       Relevant Orders   Comprehensive metabolic panel (Completed)   Hemoglobin A1c (Completed)   Essential  hypertension    Well controlled, no changes to meds. Encouraged heart healthy diet such as the DASH diet and exercise as tolerated.       Relevant Orders   CBC with Differential/Platelet (Completed)   Lipid panel (Completed)   Comprehensive metabolic panel (Completed)   POCT Urinalysis Dipstick (Automated) (Completed)   Hyperlipidemia    Tolerating statin, encouraged heart healthy diet, avoid trans fats, minimize simple carbs and saturated fats. Increase exercise as tolerated      Preventative health care - Primary    See avs ghm utd Check labs      Prostate cancer [facility])    Per urology       Other Visit Diagnoses    Need for immunization against influenza       Relevant Orders   Flu vaccine HIGH DOSE PF (Fluzone High dose) (Completed)   Hyperlipidemia LDL goal <100       Relevant Orders   Lipid panel (Completed)   Comprehensive metabolic panel (Completed)   POCT Urinalysis Dipstick (Automated) (Completed)      Follow-up: Return in about 6 months (around 09/30/2017) for hypertension, hyperlipidemia, diabetes II.  [name], DO

## 2017-04-01 NOTE — Patient Instructions (Signed)
Preventive Care 65 Years and Older, Male Preventive care refers to lifestyle choices and visits with your health care provider that can promote health and wellness. What does preventive care include?  A yearly physical exam. This is also called an annual well check.  Dental exams once or twice a year.  Routine eye exams. Ask your health care provider how often you should have your eyes checked.  Personal lifestyle choices, including: ? Daily care of your teeth and gums. ? Regular physical activity. ? Eating a healthy diet. ? Avoiding tobacco and drug use. ? Limiting alcohol use. ? Practicing safe sex. ? Taking low doses of aspirin every day. ? Taking vitamin and mineral supplements as recommended by your health care provider. What happens during an annual well check? The services and screenings done by your health care provider during your annual well check will depend on your age, overall health, lifestyle risk factors, and family history of disease. Counseling Your health care provider may ask you questions about your:  Alcohol use.  Tobacco use.  Drug use.  Emotional well-being.  Home and relationship well-being.  Sexual activity.  Eating habits.  History of falls.  Memory and ability to understand (cognition).  Work and work environment.  Screening You may have the following tests or measurements:  Height, weight, and BMI.  Blood pressure.  Lipid and cholesterol levels. These may be checked every 5 years, or more frequently if you are over 50 years old.  Skin check.  Lung cancer screening. You may have this screening every year starting at age 55 if you have a 30-pack-year history of smoking and currently smoke or have quit within the past 15 years.  Fecal occult blood test (FOBT) of the stool. You may have this test every year starting at age 50.  Flexible sigmoidoscopy or colonoscopy. You may have a sigmoidoscopy every 5 years or a colonoscopy every 10  years starting at age 50.  Prostate cancer screening. Recommendations will vary depending on your family history and other risks.  Hepatitis C blood test.  Hepatitis B blood test.  Sexually transmitted disease (STD) testing.  Diabetes screening. This is done by checking your blood sugar (glucose) after you have not eaten for a while (fasting). You may have this done every 1-3 years.  Abdominal aortic aneurysm (AAA) screening. You may need this if you are a current or former smoker.  Osteoporosis. You may be screened starting at age 70 if you are at high risk.  Talk with your health care provider about your test results, treatment options, and if necessary, the need for more tests. Vaccines Your health care provider may recommend certain vaccines, such as:  Influenza vaccine. This is recommended every year.  Tetanus, diphtheria, and acellular pertussis (Tdap, Td) vaccine. You may need a Td booster every 10 years.  Varicella vaccine. You may need this if you have not been vaccinated.  Zoster vaccine. You may need this after age 60.  Measles, mumps, and rubella (MMR) vaccine. You may need at least one dose of MMR if you were born in 1957 or later. You may also need a second dose.  Pneumococcal 13-valent conjugate (PCV13) vaccine. One dose is recommended after age 81.  Pneumococcal polysaccharide (PPSV23) vaccine. One dose is recommended after age 81.  Meningococcal vaccine. You may need this if you have certain conditions.  Hepatitis A vaccine. You may need this if you have certain conditions or if you travel or work in places where you   may be exposed to hepatitis A.  Hepatitis B vaccine. You may need this if you have certain conditions or if you travel or work in places where you may be exposed to hepatitis B.  Haemophilus influenzae type b (Hib) vaccine. You may need this if you have certain risk factors.  Talk to your health care provider about which screenings and vaccines  you need and how often you need them. This information is not intended to replace advice given to you by your health care provider. Make sure you discuss any questions you have with your health care provider. Document Released: 06/20/2015 Document Revised: 02/11/2016 Document Reviewed: 03/25/2015 Elsevier Interactive Patient Education  2017 [employer].

## 2017-04-01 NOTE — Assessment & Plan Note (Signed)
Well controlled, no changes to meds. Encouraged heart healthy diet such as the DASH diet and exercise as tolerated.  °

## 2017-04-01 NOTE — Assessment & Plan Note (Signed)
hgba1c to be checked, minimize simple carbs. Increase exercise as tolerated. Continue current meds  

## 2017-04-19 DIAGNOSIS — C61 Malignant neoplasm of prostate: Secondary | ICD-10-CM | POA: Diagnosis not present

## 2017-04-27 ENCOUNTER — Telehealth: Payer: Self-pay | Admitting: *Deleted

## 2017-04-27 NOTE — Telephone Encounter (Signed)
Called patient to remind of labs for implant on 05-06-17, pt. To have labs on 05-02-17 - arrival time - 12:45 pm @ [facility] Admitting, spoke with patient and he is aware of this appt.

## 2017-05-02 ENCOUNTER — Other Ambulatory Visit: Payer: Self-pay

## 2017-05-02 ENCOUNTER — Encounter (HOSPITAL_COMMUNITY)
Admission: RE | Admit: 2017-05-02 | Discharge: 2017-05-02 | Disposition: A | Discharge status: Home / Self Care | Payer: Medicare HMO | Source: Ambulatory Visit | Attending: Urology | Admitting: Urology

## 2017-05-02 ENCOUNTER — Encounter (HOSPITAL_BASED_OUTPATIENT_CLINIC_OR_DEPARTMENT_OTHER): Payer: Self-pay | Admitting: *Deleted

## 2017-05-02 DIAGNOSIS — Z01812 Encounter for preprocedural laboratory examination: Secondary | ICD-10-CM | POA: Diagnosis not present

## 2017-05-02 DIAGNOSIS — E785 Hyperlipidemia, unspecified: Secondary | ICD-10-CM | POA: Insufficient documentation

## 2017-05-02 DIAGNOSIS — E1136 Type 2 diabetes mellitus with diabetic cataract: Secondary | ICD-10-CM | POA: Diagnosis not present

## 2017-05-02 DIAGNOSIS — I10 Essential (primary) hypertension: Secondary | ICD-10-CM | POA: Insufficient documentation

## 2017-05-02 LAB — PROTIME-INR
INR: 1.03
Prothrombin Time: 13.4 seconds (ref 11.4–15.2)

## 2017-05-02 LAB — CBC
HCT: 36.4 % — ABNORMAL LOW (ref 39.0–52.0)
Hemoglobin: 12.1 g/dL — ABNORMAL LOW (ref 13.0–17.0)
MCH: 29.7 pg (ref 26.0–34.0)
MCHC: 33.2 g/dL (ref 30.0–36.0)
MCV: 89.2 fL (ref 78.0–100.0)
Platelets: 273 10*3/uL (ref 150–400)
RBC: 4.08 MIL/uL — ABNORMAL LOW (ref 4.22–5.81)
RDW: 14.9 % (ref 11.5–15.5)
WBC: 10 10*3/uL (ref 4.0–10.5)

## 2017-05-02 LAB — COMPREHENSIVE METABOLIC PANEL
ALT: 16 U/L — ABNORMAL LOW (ref 17–63)
AST: 21 U/L (ref 15–41)
Albumin: 3.6 g/dL (ref 3.5–5.0)
Alkaline Phosphatase: 99 U/L (ref 38–126)
Anion gap: 6 (ref 5–15)
BUN: 22 mg/dL — ABNORMAL HIGH (ref 6–20)
CO2: 29 mmol/L (ref 22–32)
Calcium: 9.3 mg/dL (ref 8.9–10.3)
Chloride: 106 mmol/L (ref 101–111)
Creatinine, Ser: 1.35 mg/dL — ABNORMAL HIGH (ref 0.61–1.24)
GFR calc Af Amer: 54 mL/min — ABNORMAL LOW (ref >60)
GFR calc non Af Amer: 47 mL/min — ABNORMAL LOW (ref >60)
Glucose, Bld: 255 mg/dL — ABNORMAL HIGH (ref 65–99)
Potassium: 4.4 mmol/L (ref 3.5–5.1)
Sodium: 141 mmol/L (ref 135–145)
Total Bilirubin: 0.8 mg/dL (ref 0.3–1.2)
Total Protein: 6.5 g/dL (ref 6.5–8.1)

## 2017-05-02 LAB — APTT: aPTT: 29 seconds (ref 24–36)

## 2017-05-02 NOTE — Progress Notes (Addendum)
Npo after midnight arrive [facility], take fleets ebnema am, take amlodipine, atorvastatin, combigan eye drop, prednosolone eye drop, tamsulosin, ekg 03-11-17 epic and on chart lov dr Caryl Comes 01-09-14 dr Caryl Comes epic on chart chest xray 03-11-17 epic cardiac cath 03-25-11 epic and on chart, son rosalio driver.

## 2017-05-04 DIAGNOSIS — C61 Malignant neoplasm of prostate: Secondary | ICD-10-CM | POA: Diagnosis not present

## 2017-05-05 ENCOUNTER — Telehealth: Payer: Self-pay | Admitting: *Deleted

## 2017-05-05 NOTE — Telephone Encounter (Signed)
CALLED PATIENT TO REMIND OF PROCEDURE FOR 05-06-17, LVM FOR A RETURN CALL

## 2017-05-05 NOTE — H&P (Signed)
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  Hx:   12/13/16: He has a history of BPH and is taking tamsulosin. In 3/18 a PSA was performed and found to be 9.32. No previous PSAs were supplied but he told me that this far as he can recall his PSA last check was about 5.2. He has no history of prostatitis UTIs. He has not had any change in his voiding habits and his weight is the same as it was in high school.  He participates in senior games. He has received several gold metals. He goes to the gym 5 days a week and is quite active and healthy.    Interval history 01/25/17: He returns today for TRUS/BX due to an elevated PSA. rThere did seem to be some firm nodularity towards the apex of the left hand side and a rubbery nodule at the base on the right hand side.   Interval history 02/03/17: He reports having had no complications or difficulties following his prostate biopsy.  TRUS/BX 01/25/17: PSA - 9.32, DRE nodularity in the left apex with rubbery nodule at the right base.  Prostate volume - 34 cc  Pathology: Adenocarcinoma Gleason score 3+4 = 7 in 3 cores and 3+3 = 6 in 4 cores were total of 7/12 cores positive.  Stage: T1c (nodularity did not correspond with location of biopsy positivity.)   His prostate cancer was diagnosed 01/28/2017. His PSA at his time of diagnosis was 9.32. His cancer was T1c, Gleason score 3+4 = 7 in 3 cores and 3+3 = 6 in 4 cores were total of 7/12 cores positive..     ALLERGIES: None   MEDICATIONS: Tamsulosin Hcl 0.4 mg capsule, ext release 24 hr  Amlodipine Besylate 5 mg tablet  Aspirin Ec 81 mg tablet, delayed release  Atorvastatin Calcium 10 mg tablet  Combigan 0.2 %-0.5 % drops  Fish Oil  Glimepiride 2 mg tablet  Hydrocodone-Acetaminophen 5 mg-325 mg tablet  Janumet Xr 100 mg-1,000 mg tablet,extended release multiphase 24 hr  Losartan-Hydrochlorothiazide 100 mg-25 mg tablet  Multiple Vitamin  Sodium Chloride 5  % ointment     GU PSH: Prostate Needle Biopsy - 01/25/2017      PSH Notes: Coronary stent placement approx 8-9 years ago. Cardiologist was [facility].   NON-GU PSH: Cataract surgery Surgical Pathology, Gross And Microscopic Examination For Prostate Needle - 01/25/2017    GU PMH: Prostate Cancer, Although he turned 53 today he is physiologically much younger and I feel consideration of radioactive seeds should be given and I discussed that with him today. He's not sure that he wants to proceed with this but I'm going to make him an appointment with the radiation oncologist to discuss this further. - 02/03/2017 Elevated PSA (Stable), I have discussed with the patient the possibility of blood per rectum, per urethra and in the ejaculate. He was counseled to contact me if he has any difficulties following his prostate biopsy whatsoever. - 01/25/2017, I have discussed with the patient the possible need for further evaluation of his elevated PSA. We have discussed the options which would be continued observation with serial DRE and PSAs versus proceeding with further evaluation at this time with TRUS/Bx. We have discussed the possible risk of progression and spread of prostate cancer if present currently as well as the fact that typically prostate cancer tends to be a relatively slow-growing form of cancer that typically would not progress significantly over the relative short  period of time between serial examinations. We also have discussed proceeding at this time with a prostate biopsy and I therefore have gone over the procedure with him in detail. We have discussed its potential risks and complications as well as limitations. Because of his significant rise in PSA, abnormal DRE and excellent physical health we have decided to proceed with further evaluation with TRUS/BX., - 12/13/2016 Prostate nodule w/o LUTS, He does have some enlargement of his prostate as well as a left apical nodule that is slightly firm and  moderately suspicious as well as a softer nodule in the base on the right-hand side. He remains on finasteride for his BPH. - 12/13/2016      PMH Notes: Coronary artery disease, colon polyps   NON-GU PMH: Diabetes Type 2 Glaucoma Hypercholesterolemia Hypertension    FAMILY HISTORY: 3 Son's - Son Coronary Artery Disease - Father Primary cancer of lesser curve of stomach - Mother   SOCIAL HISTORY: Marital Status: Widowed Preferred Language: English; Race: White Current Smoking Status: Patient does not smoke anymore. Has not smoked since 06/07/1958. Smoked 1 pack per day.   Tobacco Use Assessment Completed: Used Tobacco in last 30 days? Does not use smokeless tobacco. Drinks 1 drink per day. Types of alcohol consumed: Wine. Light Drinker.  Does not use drugs. Drinks 1 caffeinated drink per day. Has not had a blood transfusion.    REVIEW OF SYSTEMS:    GU Review Male:   Patient reports stream starts and stops. Patient denies frequent urination, hard to postpone urination, burning/ pain with urination, get up at night to urinate, leakage of urine, trouble starting your stream, have to strain to urinate , erection problems, and penile pain.  Gastrointestinal (Upper):   Patient denies nausea, vomiting, and indigestion/ heartburn.  Gastrointestinal (Lower):   Patient denies diarrhea and constipation.  Constitutional:   Patient denies fever, night sweats, weight loss, and fatigue.  Skin:   Patient denies skin rash/ lesion and itching.  Eyes:   Patient denies blurred vision and double vision.  Ears/ Nose/ Throat:   Patient denies sinus problems and sore throat.  Hematologic/Lymphatic:   Patient denies swollen glands and easy bruising.  Cardiovascular:   Patient denies leg swelling and chest pains.  Respiratory:   Patient denies cough and shortness of breath.  Endocrine:   Patient denies excessive thirst.  Musculoskeletal:   Patient denies back pain and joint pain.  Neurological:   Patient  denies headaches and dizziness.  Psychologic:   Patient denies depression and anxiety.   VITAL SIGNS:     Weight 125 lb / 56.7 kg  Height 64 in / 162.56 cm  BP 150/70 mmHg  Pulse 68 /min  Temperature 97.7 F / 36.5 C  BMI 21.5 kg/m   MULTI-SYSTEM PHYSICAL EXAMINATION:    Constitutional: Well-nourished. No physical deformities. Normally developed. Good grooming.  Neck: Neck symmetrical, not swollen. Normal tracheal position.  Respiratory: No labored breathing, no use of accessory muscles.   Cardiovascular: Normal temperature, normal extremity pulses, no swelling, no varicosities.  Lymphatic: No enlargement of neck, axillae, groin.  Skin: No paleness, no jaundice, no cyanosis. No lesion, no ulcer, no rash.  Neurologic / Psychiatric: Oriented to time, oriented to place, oriented to person. No depression, no anxiety, no agitation.  Gastrointestinal: No mass, no tenderness, no rigidity, non obese abdomen.  Eyes: Normal conjunctivae. Normal eyelids.  Ears, Nose, Mouth, and Throat: Left ear no scars, no lesions, no masses. Right ear no scars, no lesions,  no masses. Nose no scars, no lesions, no masses. Normal hearing. Normal lips.  Musculoskeletal: Normal gait and station of head and neck.     PAST DATA REVIEWED:  Source Of History:  Patient  Records Review:   Previous Patient Records  Urine Test Review:   Urinalysis   08/30/16  PSA  Total PSA 9.32 ng/dl    04/19/17  Urinalysis  Urine Appearance Clear   Urine Color Yellow   Urine Glucose 1+   Urine Bilirubin Neg   Urine Ketones Neg   Urine Specific Gravity 1.025   Urine Blood Neg   Urine pH 5.5   Urine Protein 1+   Urine Urobilinogen 0.2   Urine Nitrites Neg   Urine Leukocyte Esterase Neg   Urine WBC/hpf 0 - 5/hpf   Urine RBC/hpf 0 - 2/hpf   Urine Epithelial Cells NS (Not Seen)   Urine Bacteria NS (Not Seen)   Urine Mucous Not Present   Urine Yeast NS (Not Seen)   Urine Trichomonas Not Present   Urine Cystals NS (Not  Seen)   Urine Casts NS (Not Seen)   Urine Sperm Not Present    PROCEDURES:          Urinalysis w/Scope Dipstick Dipstick Cont'd Micro  Color: Yellow Bilirubin: Neg WBC/hpf: 0 - 5/hpf  Appearance: Clear Ketones: Neg RBC/hpf: 0 - 2/hpf  Specific Gravity: 1.025 Blood: Neg Bacteria: NS (Not Seen)  pH: 5.5 Protein: 1+ Cystals: NS (Not Seen)  Glucose: 1+ Urobilinogen: 0.2 Casts: NS (Not Seen)    Nitrites: Neg Trichomonas: Not Present    Leukocyte Esterase: Neg Mucous: Not Present      Epithelial Cells: NS (Not Seen)      Yeast: NS (Not Seen)      Sperm: Not Present    ASSESSMENT/PLAN:      ICD-10 Details  1 GU:   Prostate Cancer - although he is 84 years old he is physiologically much younger and therefore I feel treatment of his prostate cancer is indicated.  We discussed the options for treatment and he has elected to proceed with radioactive seed implant.  This will be performed with placement of SpaceOAR.

## 2017-05-06 ENCOUNTER — Ambulatory Visit (HOSPITAL_BASED_OUTPATIENT_CLINIC_OR_DEPARTMENT_OTHER): Payer: Medicare HMO | Admitting: Anesthesiology

## 2017-05-06 ENCOUNTER — Encounter (HOSPITAL_BASED_OUTPATIENT_CLINIC_OR_DEPARTMENT_OTHER): Payer: Self-pay | Admitting: *Deleted

## 2017-05-06 ENCOUNTER — Ambulatory Visit (HOSPITAL_COMMUNITY): Payer: Medicare HMO

## 2017-05-06 ENCOUNTER — Other Ambulatory Visit: Payer: Self-pay

## 2017-05-06 ENCOUNTER — Encounter (HOSPITAL_BASED_OUTPATIENT_CLINIC_OR_DEPARTMENT_OTHER)
Admission: RE | Disposition: A | Discharge status: Home / Self Care | Payer: Self-pay | Source: Ambulatory Visit | Attending: Urology

## 2017-05-06 ENCOUNTER — Ambulatory Visit (HOSPITAL_BASED_OUTPATIENT_CLINIC_OR_DEPARTMENT_OTHER)
Admission: RE | Admit: 2017-05-06 | Discharge: 2017-05-06 | Disposition: A | Discharge status: Home / Self Care | Payer: Medicare HMO | Source: Ambulatory Visit | Attending: Urology | Admitting: Urology

## 2017-05-06 DIAGNOSIS — Z87891 Personal history of nicotine dependence: Secondary | ICD-10-CM | POA: Diagnosis not present

## 2017-05-06 DIAGNOSIS — I252 Old myocardial infarction: Secondary | ICD-10-CM | POA: Insufficient documentation

## 2017-05-06 DIAGNOSIS — F419 Anxiety disorder, unspecified: Secondary | ICD-10-CM | POA: Insufficient documentation

## 2017-05-06 DIAGNOSIS — E785 Hyperlipidemia, unspecified: Secondary | ICD-10-CM | POA: Diagnosis not present

## 2017-05-06 DIAGNOSIS — Z7982 Long term (current) use of aspirin: Secondary | ICD-10-CM | POA: Insufficient documentation

## 2017-05-06 DIAGNOSIS — I10 Essential (primary) hypertension: Secondary | ICD-10-CM | POA: Diagnosis not present

## 2017-05-06 DIAGNOSIS — N138 Other obstructive and reflux uropathy: Secondary | ICD-10-CM | POA: Diagnosis not present

## 2017-05-06 DIAGNOSIS — Z86010 Personal history of colonic polyps: Secondary | ICD-10-CM | POA: Diagnosis not present

## 2017-05-06 DIAGNOSIS — N400 Enlarged prostate without lower urinary tract symptoms: Secondary | ICD-10-CM | POA: Diagnosis not present

## 2017-05-06 DIAGNOSIS — Z7984 Long term (current) use of oral hypoglycemic drugs: Secondary | ICD-10-CM | POA: Insufficient documentation

## 2017-05-06 DIAGNOSIS — I2510 Atherosclerotic heart disease of native coronary artery without angina pectoris: Secondary | ICD-10-CM | POA: Diagnosis not present

## 2017-05-06 DIAGNOSIS — Z9889 Other specified postprocedural states: Secondary | ICD-10-CM | POA: Diagnosis not present

## 2017-05-06 DIAGNOSIS — Z01818 Encounter for other preprocedural examination: Secondary | ICD-10-CM

## 2017-05-06 DIAGNOSIS — Z955 Presence of coronary angioplasty implant and graft: Secondary | ICD-10-CM | POA: Diagnosis not present

## 2017-05-06 DIAGNOSIS — E119 Type 2 diabetes mellitus without complications: Secondary | ICD-10-CM | POA: Diagnosis not present

## 2017-05-06 DIAGNOSIS — Z510 Encounter for antineoplastic radiation therapy: Secondary | ICD-10-CM | POA: Diagnosis not present

## 2017-05-06 DIAGNOSIS — C61 Malignant neoplasm of prostate: Secondary | ICD-10-CM | POA: Diagnosis not present

## 2017-05-06 DIAGNOSIS — Z923 Personal history of irradiation: Secondary | ICD-10-CM | POA: Diagnosis not present

## 2017-05-06 DIAGNOSIS — Z79899 Other long term (current) drug therapy: Secondary | ICD-10-CM | POA: Diagnosis not present

## 2017-05-06 DIAGNOSIS — N401 Enlarged prostate with lower urinary tract symptoms: Secondary | ICD-10-CM | POA: Diagnosis not present

## 2017-05-06 HISTORY — DX: Endothelial corneal dystrophy, unspecified eye: H18.519

## 2017-05-06 HISTORY — DX: Endothelial corneal dystrophy: H18.51

## 2017-05-06 HISTORY — DX: Unspecified macular degeneration: H35.30

## 2017-05-06 HISTORY — PX: SPACE OAR INSTILLATION: SHX6769

## 2017-05-06 HISTORY — PX: RADIOACTIVE SEED IMPLANT: SHX5150

## 2017-05-06 LAB — POCT I-STAT 4, (NA,K, GLUC, HGB,HCT)
Glucose, Bld: 143 mg/dL — ABNORMAL HIGH (ref 65–99)
HCT: 36 % — ABNORMAL LOW (ref 39.0–52.0)
Hemoglobin: 12.2 g/dL — ABNORMAL LOW (ref 13.0–17.0)
Potassium: 3.6 mmol/L (ref 3.5–5.1)
Sodium: 145 mmol/L (ref 135–145)

## 2017-05-06 LAB — GLUCOSE, CAPILLARY: Glucose-Capillary: 185 mg/dL — ABNORMAL HIGH (ref 65–99)

## 2017-05-06 SURGERY — INSERTION, RADIATION SOURCE, PROSTATE
Anesthesia: General

## 2017-05-06 MED ORDER — FENTANYL CITRATE (PF) 100 MCG/2ML IJ SOLN
INTRAMUSCULAR | Status: AC
  Filled 2017-05-06: qty 2

## 2017-05-06 MED ORDER — SODIUM CHLORIDE 0.9 % IJ SOLN
INTRAMUSCULAR | Status: DC | PRN
  Administered 2017-05-06: 10 mL via INTRAVENOUS

## 2017-05-06 MED ORDER — EPHEDRINE SULFATE 50 MG/ML IJ SOLN
INTRAMUSCULAR | Status: DC | PRN
  Administered 2017-05-06: 10 mg via INTRAVENOUS

## 2017-05-06 MED ORDER — FENTANYL CITRATE (PF) 100 MCG/2ML IJ SOLN
INTRAMUSCULAR | Status: DC | PRN
  Administered 2017-05-06 (×2): 25 ug via INTRAVENOUS
  Administered 2017-05-06 (×2): 12.5 ug via INTRAVENOUS
  Administered 2017-05-06: 25 ug via INTRAVENOUS

## 2017-05-06 MED ORDER — FLEET ENEMA 7-19 GM/118ML RE ENEM
1.0000 | ENEMA | Freq: Once | RECTAL | Status: DC
  Filled 2017-05-06: qty 1

## 2017-05-06 MED ORDER — LIDOCAINE 2% (20 MG/ML) 5 ML SYRINGE
INTRAMUSCULAR | Status: AC
  Filled 2017-05-06: qty 5

## 2017-05-06 MED ORDER — SODIUM CHLORIDE 0.9 % IR SOLN
Status: DC | PRN
  Administered 2017-05-06: 1000 mL via INTRAVESICAL

## 2017-05-06 MED ORDER — ONDANSETRON HCL 4 MG/2ML IJ SOLN
INTRAMUSCULAR | Status: AC
  Filled 2017-05-06: qty 2

## 2017-05-06 MED ORDER — CIPROFLOXACIN IN D5W 400 MG/200ML IV SOLN
INTRAVENOUS | Status: AC
  Filled 2017-05-06: qty 200

## 2017-05-06 MED ORDER — IOHEXOL 300 MG/ML  SOLN
INTRAMUSCULAR | Status: DC | PRN
  Administered 2017-05-06: 7 mL via INTRAVENOUS

## 2017-05-06 MED ORDER — PROPOFOL 10 MG/ML IV BOLUS
INTRAVENOUS | Status: DC | PRN
  Administered 2017-05-06: 150 mg via INTRAVENOUS
  Administered 2017-05-06: 50 mg via INTRAVENOUS

## 2017-05-06 MED ORDER — CIPROFLOXACIN HCL 500 MG PO TABS: 500.0000 mg | ORAL_TABLET | Freq: Two times a day (BID) | ORAL | 0 refills | Status: DC

## 2017-05-06 MED ORDER — LACTATED RINGERS IV SOLN
INTRAVENOUS | Status: DC
  Administered 2017-05-06: 06:00:00 via INTRAVENOUS
  Filled 2017-05-06: qty 1000

## 2017-05-06 MED ORDER — LIDOCAINE 2% (20 MG/ML) 5 ML SYRINGE
INTRAMUSCULAR | Status: DC | PRN
  Administered 2017-05-06: 60 mg via INTRAVENOUS

## 2017-05-06 MED ORDER — DEXAMETHASONE SODIUM PHOSPHATE 4 MG/ML IJ SOLN
INTRAMUSCULAR | Status: DC | PRN
  Administered 2017-05-06: 10 mg via INTRAVENOUS

## 2017-05-06 MED ORDER — HYDROCODONE-ACETAMINOPHEN 10-325 MG PO TABS
1.0000 | ORAL_TABLET | ORAL | 0 refills | Status: DC | PRN
Start: 2017-05-06 — End: 2017-09-26

## 2017-05-06 MED ORDER — ONDANSETRON HCL 4 MG/2ML IJ SOLN
INTRAMUSCULAR | Status: DC | PRN
  Administered 2017-05-06: 4 mg via INTRAVENOUS

## 2017-05-06 MED ORDER — CIPROFLOXACIN IN D5W 400 MG/200ML IV SOLN
400.0000 mg | INTRAVENOUS | Status: AC
  Administered 2017-05-06: 400 mg via INTRAVENOUS
  Filled 2017-05-06: qty 200

## 2017-05-06 MED ORDER — FENTANYL CITRATE (PF) 100 MCG/2ML IJ SOLN
25.0000 ug | INTRAMUSCULAR | Status: DC | PRN
  Filled 2017-05-06: qty 1

## 2017-05-06 MED ORDER — DEXAMETHASONE SODIUM PHOSPHATE 10 MG/ML IJ SOLN
INTRAMUSCULAR | Status: AC
  Filled 2017-05-06: qty 1

## 2017-05-06 MED ORDER — ONDANSETRON HCL 4 MG/2ML IJ SOLN
4.0000 mg | Freq: Once | INTRAMUSCULAR | Status: DC | PRN
  Filled 2017-05-06: qty 2

## 2017-05-06 MED ORDER — PROPOFOL 10 MG/ML IV BOLUS
INTRAVENOUS | Status: AC
  Filled 2017-05-06: qty 20

## 2017-05-06 MED ORDER — EPHEDRINE 5 MG/ML INJ
INTRAVENOUS | Status: AC
  Filled 2017-05-06: qty 10

## 2017-05-06 SURGICAL SUPPLY — 30 items
BAG URINE DRAINAGE (UROLOGICAL SUPPLIES) ×3 IMPLANT
BLADE CLIPPER SURG (BLADE) ×3 IMPLANT
CATH FOLEY 2WAY SLVR  5CC 16FR (CATHETERS) ×2
CATH FOLEY 2WAY SLVR 5CC 16FR (CATHETERS) ×1 IMPLANT
CATH ROBINSON RED A/P 20FR (CATHETERS) ×3 IMPLANT
CLOTH BEACON ORANGE TIMEOUT ST (SAFETY) ×3 IMPLANT
COVER BACK TABLE 60X90IN (DRAPES) ×3 IMPLANT
COVER MAYO STAND STRL (DRAPES) ×3 IMPLANT
DRSG TEGADERM 4X4.75 (GAUZE/BANDAGES/DRESSINGS) ×3 IMPLANT
DRSG TEGADERM 8X12 (GAUZE/BANDAGES/DRESSINGS) ×3 IMPLANT
GAUZE SPONGE 4X4 12PLY STRL LF (GAUZE/BANDAGES/DRESSINGS) ×3 IMPLANT
GLOVE BIO SURGEON STRL SZ8 (GLOVE) ×3 IMPLANT
GLOVE BIOGEL M SZ8.5 STRL (GLOVE) ×3 IMPLANT
GLOVE BIOGEL PI IND STRL 8.5 (GLOVE) ×2 IMPLANT
GLOVE BIOGEL PI INDICATOR 8.5 (GLOVE) ×4
GLOVE ECLIPSE 8.0 STRL XLNG CF (GLOVE) ×12 IMPLANT
GOWN STRL REUS W/TWL XL LVL3 (GOWN DISPOSABLE) ×6 IMPLANT
HOLDER FOLEY CATH W/STRAP (MISCELLANEOUS) IMPLANT
IMPL SPACEOAR SYSTEM 10ML (MISCELLANEOUS) ×1 IMPLANT
IMPLANT SPACEOAR SYSTEM 10ML (MISCELLANEOUS) ×3
IV NS 1000ML (IV SOLUTION) ×2
IV NS 1000ML BAXH (IV SOLUTION) ×1 IMPLANT
KIT RM TURNOVER CYSTO AR (KITS) ×3 IMPLANT
PACK CYSTO (CUSTOM PROCEDURE TRAY) ×3 IMPLANT
SELECTSEED 1-125 ×3 IMPLANT
SURGILUBE 2OZ TUBE FLIPTOP (MISCELLANEOUS) ×3 IMPLANT
SUT BONE WAX W31G (SUTURE) ×3 IMPLANT
SYRINGE 10CC LL (SYRINGE) IMPLANT
UNDERPAD 30X30 (UNDERPADS AND DIAPERS) ×6 IMPLANT
WATER STERILE IRR 500ML POUR (IV SOLUTION) ×3 IMPLANT

## 2017-05-06 NOTE — Anesthesia Postprocedure Evaluation (Signed)
Anesthesia Post Note  Patient: [name]  Procedure(s) Performed: RADIOACTIVE SEED IMPLANT/BRACHYTHERAPY IMPLANT (N/A ) SPACE OAR INSTILLATION (N/A )     Patient location during evaluation: PACU Anesthesia Type: General Level of consciousness: awake and alert Pain management: pain level controlled Vital Signs Assessment: post-procedure vital signs reviewed and stable Respiratory status: spontaneous breathing, nonlabored ventilation and respiratory function stable Cardiovascular status: blood pressure returned to baseline and stable Postop Assessment: no apparent nausea or vomiting Anesthetic complications: no    Last Vitals:  Vitals:   05/06/17 0930 05/06/17 0945  BP: 133/67 136/60  Pulse: 65 62  Resp: 18 15  Temp:    SpO2: 96% 96%    Last Pain:  Vitals:   05/06/17 0530  TempSrc: Oral                 Catalina Gravel

## 2017-05-06 NOTE — Transfer of Care (Signed)
Immediate Anesthesia Transfer of Care Note  Patient: [name]  Procedure(s) Performed: Procedure(s) (LRB): RADIOACTIVE SEED IMPLANT/BRACHYTHERAPY IMPLANT (N/A) SPACE OAR INSTILLATION (N/A)  Patient Location: PACU  Anesthesia Type: General  Level of Consciousness: awake, sedated, patient cooperative and responds to stimulation  Airway & Oxygen Therapy: Patient Spontanous Breathing and Patient connected to [place] oxygen  Post-op Assessment: Report given to PACU RN, Post -op Vital signs reviewed and stable and Patient moving all extremities  Post vital signs: Reviewed and stable  Complications: No apparent anesthesia complications

## 2017-05-06 NOTE — Anesthesia Procedure Notes (Signed)
Procedure Name: LMA Insertion Date/Time: 05/06/2017 7:33 AM Performed by: Justice Rocher, CRNA Pre-anesthesia Checklist: Patient identified, Emergency Drugs available, Suction available and Patient being monitored Patient Re-evaluated:Patient Re-evaluated prior to induction Oxygen Delivery Method: Circle system utilized Preoxygenation: Pre-oxygenation with 100% oxygen Induction Type: IV induction Ventilation: Mask ventilation without difficulty LMA: LMA inserted LMA Size: 4.0 Number of attempts: 1 Airway Equipment and Method: Bite block Placement Confirmation: positive ETCO2 and breath sounds checked- equal and bilateral Tube secured with: Tape Dental Injury: Teeth and Oropharynx as per pre-operative assessment

## 2017-05-06 NOTE — Anesthesia Preprocedure Evaluation (Signed)
Anesthesia Evaluation  Patient identified by MRN, date of birth, ID band Patient awake    Reviewed: Allergy & Precautions, NPO status , Patient's Chart, lab work & pertinent test results  Airway Mallampati: II  TM Distance: >3 FB Neck ROM: Full    Dental  (+) Teeth Intact, Dental Advisory Given   Pulmonary former smoker,    Pulmonary exam normal breath sounds clear to auscultation       Cardiovascular hypertension, Pt. on medications (-) angina+ CAD and + Cardiac Stents  (-) Past MI Normal cardiovascular exam Rhythm:Regular Rate:Normal     Neuro/Psych PSYCHIATRIC DISORDERS Anxiety negative neurological ROS     GI/Hepatic negative GI ROS, Neg liver ROS,   Endo/Other  diabetes, Type 2, Oral Hypoglycemic Agents  Renal/GU Renal InsufficiencyRenal disease   Prostate cancer    Musculoskeletal negative musculoskeletal ROS (+)   Abdominal   Peds  Hematology  (+) Blood dyscrasia, anemia ,   Anesthesia Other Findings Day of surgery medications reviewed with the patient.  Reproductive/Obstetrics                             Anesthesia Physical Anesthesia Plan  ASA: III  Anesthesia Plan: General   Post-op Pain Management:    Induction: Intravenous  PONV Risk Score and Plan: 2  Airway Management Planned: LMA  Additional Equipment:   Intra-op Plan:   Post-operative Plan: Extubation in OR  Informed Consent: I have reviewed the patients History and Physical, chart, labs and discussed the procedure including the risks, benefits and alternatives for the proposed anesthesia with the patient or authorized representative who has indicated his/her understanding and acceptance.   Dental advisory given  Plan Discussed with: CRNA  Anesthesia Plan Comments: (Risks/benefits of general anesthesia discussed with patient including risk of damage to teeth, lips, gum, and tongue, nausea/vomiting,  allergic reactions to medications, and the possibility of heart attack, stroke and death.  All patient questions answered.  Patient wishes to proceed.)        Anesthesia Quick Evaluation

## 2017-05-06 NOTE — Op Note (Signed)
PATIENT:  [name]  PRE-OPERATIVE DIAGNOSIS:  Adenocarcinoma of the prostate  POST-OPERATIVE DIAGNOSIS:  Same  PROCEDURE:  1. I-125 radioactive seed implantation 2. Cystoscopy  3. Placement of SpaceOAR  SURGEON:  Surgeon(s): Claybon Jabs  Radiation oncologist: Dr. 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  He has elected to proceed with treatment which is appropriate despite his age because of his excellent health.  Description of procedure: After informed consent the patient was brought to the major OR, placed on the table and administered general anesthesia. He was then moved to the modified lithotomy position with his perineum perpendicular to the floor. His perineum and genitalia were then sterilely prepped. An official timeout was then performed. A 16 French Foley catheter was then placed in the bladder and filled with dilute contrast, a rectal tube was placed in the rectum and the transrectal ultrasound probe was placed in the rectum and affixed to the stand. He was then sterilely draped.  Real time ultrasonography was used along with the seed planning software [employer] Prostate vs. 4.2.2.4. This was used to develop the seed plan including the number of needles as well as number of seeds required for complete and adequate coverage. Real-time ultrasonography was then used along with the previously developed plan and the Nucletron device to implant a total of 84 seeds using 25 needles. This proceeded without difficulty or complication.   I then proceeded with placement of SpaceOARby introducing a needle with the bevel angled inferiorly approximately 2 cm superior to the anus. This was angled downward and under direct ultrasound was placed within the space between the prostatic capsule and rectum. This was confirmed with a small amount of sterile saline  injected and this was performed under direct ultrasound. I then attached the SpaceOARto the needle and injected this in the space between the prostate and rectum with good placement noted.  A Foley catheter was then removed as well as the transrectal ultrasound probe and rectal probe. Flexible cystoscopy was then performed using the 17 French flexible scope which revealed a normal urethra throughout its length down to the sphincter which appeared intact. The prostatic urethra revealed bilobar hypertrophy but no evidence of obstruction, seeds, spacers or lesions. The bladder was then entered and fully and systematically inspected. The ureteral orifices were noted to be of normal configuration and position. The mucosa revealed no evidence of tumors. There were also no stones identified within the bladder. I noted no seeds or spacers on the floor of the bladder and retroflexion of the scope revealed no seeds protruding from the base of the prostate.  The cystoscope was then removed and the patient was awakened and taken to recovery room in stable and satisfactory condition. He tolerated procedure well and there were no intraoperative complications.

## 2017-05-06 NOTE — Discharge Instructions (Signed)

## 2017-05-09 ENCOUNTER — Encounter (HOSPITAL_BASED_OUTPATIENT_CLINIC_OR_DEPARTMENT_OTHER): Payer: Self-pay | Admitting: Urology

## 2017-05-09 NOTE — Progress Notes (Signed)
  Radiation Oncology         [phone] ________________________________  Name: KAYLAN FRIEDMANN MRN: [id number]  Date: 05/09/2017  DOB: 29-Jan-1932       Prostate Seed Implant  YP:[id number] Lyndal Pulley R, DO  No ref. provider found  DIAGNOSIS: 83 y.o. gentleman with Stage T2c adenocarcinoma of the prostate with Gleason Score of 3+4, and PSA of 9.32    ICD-10-CM   1. Pre-op testing Z01.818 CANCELED: DG Chest 2 View    CANCELED: DG Chest 2 View  2. Preop examination Z01.818 DG Chest 2 View    DG Chest 2 View    PROCEDURE: Insertion of radioactive I-125 seeds into the prostate gland.  RADIATION DOSE: 145 Gy, definitive therapy.  TECHNIQUE: HAKIEM MALIZIA was brought to the operating room with the urologist. He was placed in the dorsolithotomy position. He was catheterized and a rectal tube was inserted. The perineum was shaved, prepped and draped. The ultrasound probe was then introduced into the rectum to see the prostate gland.  TREATMENT DEVICE: A needle grid was attached to the ultrasound probe stand and anchor needles were placed.  3D PLANNING: The prostate was imaged in 3D using a sagittal sweep of the prostate probe. These images were transferred to the planning computer. There, the prostate, urethra and rectum were defined on each axial reconstructed image. Then, the software created an optimized 3D plan and a few seed positions were adjusted. The quality of the plan was reviewed using [facility] information for the target and the following two organs at risk:  Urethra and Rectum.  Then the accepted plan was uploaded to the seed Selectron afterloading unit.  PROSTATE VOLUME STUDY:  Using transrectal ultrasound the volume of the prostate was verified to be 40 cc.  SPECIAL TREATMENT PROCEDURE/SUPERVISION AND HANDLING: The Nucletron FIRST system was used to place the needles under sagittal guidance. A total of 25 needles were used to deposit 84 seeds in the prostate gland. The individual seed  activity was 0.379 mCi.  SpaceOAR:  Yes  COMPLEX SIMULATION: At the end of the procedure, an anterior radiograph of the pelvis was obtained to document seed positioning and count. Cystoscopy was performed to check the urethra and bladder.  MICRODOSIMETRY: At the end of the procedure, the patient was emitting 0.253 mR/hr at 1 meter. Accordingly, he was considered safe for hospital discharge.  PLAN: The patient will return to the radiation oncology clinic for post implant CT dosimetry in three weeks.   ________________________________  Sheral Apley Tammi Klippel, M.D.

## 2017-05-26 ENCOUNTER — Telehealth: Payer: Self-pay | Admitting: *Deleted

## 2017-05-26 NOTE — Telephone Encounter (Signed)
CALLED PATIENT TO REMIND OF POST SEED APPTS. AND MRI FOR 05-27-17, LVM FOR A RETURN CALL

## 2017-05-27 ENCOUNTER — Ambulatory Visit
Admission: RE | Admit: 2017-05-27 | Discharge: 2017-05-27 | Disposition: A | Discharge status: Home / Self Care | Payer: Medicare HMO | Source: Ambulatory Visit | Attending: Radiation Oncology | Admitting: Radiation Oncology

## 2017-05-27 ENCOUNTER — Ambulatory Visit (HOSPITAL_COMMUNITY)
Admission: RE | Admit: 2017-05-27 | Discharge: 2017-05-27 | Disposition: A | Discharge status: Home / Self Care | Payer: Medicare HMO | Source: Ambulatory Visit | Attending: Urology | Admitting: Urology

## 2017-05-27 ENCOUNTER — Telehealth: Payer: Self-pay | Admitting: Medical Oncology

## 2017-05-27 VITALS — BP 150/81 | HR 76 | Resp 18 | Wt 133.4 lb

## 2017-05-27 DIAGNOSIS — I2510 Atherosclerotic heart disease of native coronary artery without angina pectoris: Secondary | ICD-10-CM | POA: Diagnosis not present

## 2017-05-27 DIAGNOSIS — E119 Type 2 diabetes mellitus without complications: Secondary | ICD-10-CM | POA: Insufficient documentation

## 2017-05-27 DIAGNOSIS — Z9889 Other specified postprocedural states: Secondary | ICD-10-CM | POA: Insufficient documentation

## 2017-05-27 DIAGNOSIS — R3914 Feeling of incomplete bladder emptying: Secondary | ICD-10-CM | POA: Diagnosis not present

## 2017-05-27 DIAGNOSIS — Z87891 Personal history of nicotine dependence: Secondary | ICD-10-CM | POA: Insufficient documentation

## 2017-05-27 DIAGNOSIS — Z7984 Long term (current) use of oral hypoglycemic drugs: Secondary | ICD-10-CM | POA: Insufficient documentation

## 2017-05-27 DIAGNOSIS — E785 Hyperlipidemia, unspecified: Secondary | ICD-10-CM | POA: Diagnosis not present

## 2017-05-27 DIAGNOSIS — Z7982 Long term (current) use of aspirin: Secondary | ICD-10-CM | POA: Diagnosis not present

## 2017-05-27 DIAGNOSIS — Z510 Encounter for antineoplastic radiation therapy: Secondary | ICD-10-CM | POA: Insufficient documentation

## 2017-05-27 DIAGNOSIS — Z79899 Other long term (current) drug therapy: Secondary | ICD-10-CM | POA: Diagnosis not present

## 2017-05-27 DIAGNOSIS — Z86010 Personal history of colonic polyps: Secondary | ICD-10-CM | POA: Insufficient documentation

## 2017-05-27 DIAGNOSIS — Z955 Presence of coronary angioplasty implant and graft: Secondary | ICD-10-CM | POA: Diagnosis not present

## 2017-05-27 DIAGNOSIS — I10 Essential (primary) hypertension: Secondary | ICD-10-CM | POA: Insufficient documentation

## 2017-05-27 DIAGNOSIS — C61 Malignant neoplasm of prostate: Secondary | ICD-10-CM | POA: Insufficient documentation

## 2017-05-27 NOTE — Progress Notes (Signed)
Radiation Oncology         [phone] ________________________________  Name: [name] MRN: [id number]  Date: 05/27/2017  DOB: [birth year]  Post Seed Follow-Up Visit Note  CC: [name], DO  Kathie Rhodes, MD  Diagnosis:   83 y.o. gentleman with Stage T2c adenocarcinoma of the prostate with Gleason Score of 3+4, and PSA of 9.32.     ICD-10-CM   1. Prostate cancer ([place]) C61     Interval Since Last Radiation:  3 weeks 05/06/17: Insertion of radioactive I-125 seeds into the prostate gland with placement of SpaceOAR; 145 Gy, definitive therapy.  Narrative:  The patient returns today for routine follow-up.  He is complaining of increased urinary frequency and urinary hesitation symptoms. He filled out a questionnaire regarding urinary function today providing and overall IPSS score of 28 characterizing his symptoms as severe with increased frequency, urgency, weak stream, straining to void and feelings of incomplete emptying.  He was seen at [facility] urology today and was advised that based on U/S PVR, he does not completely empty his bladder, however, he was not felt to be in acute urinary retention and did not require a Foley catheter. He was advised to continue his Flomax daily. He has mild dysuria which he feels is gradually improving but worse at the very tip of the penis. He reports nocturia 3 per night but this is not bothersome to him. He denies gross hematuria or incontinence.  His pre-implant score was 5. He denies fever, chills, nausea, vomiting, or diarrhea. He reports a healthy appetite and is maintaining his weight.                            ALLERGIES:  has No Known Allergies.  Meds: Current Outpatient Medications  Medication Sig Dispense Refill  . amLODipine (NORVASC) 5 MG tablet TAKE 1 TABLET (5 MG TOTAL) BY MOUTH DAILY. 30 tablet 5  . aspirin 81 MG tablet Take 81 mg by mouth daily.    [name] atorvastatin (LIPITOR) 10 MG tablet TAKE 1 TABLET BY MOUTH DAILY  (*REPEAT LABS ARE DUE*) 90 tablet 0  . brimonidine-timolol (COMBIGAN) 0.2-0.5 % ophthalmic solution Place 1 drop into the right eye 2 (two) times daily.     . ciprofloxacin (CIPRO) 500 MG tablet Take 1 tablet (500 mg total) by mouth 2 (two) times daily. 6 tablet 0  . fish oil-omega-3 fatty acids 1000 MG capsule Take 2 g by mouth daily.      [name] glimepiride (AMARYL) 2 MG tablet TAKE 1 TABLET BY MOUTH EVERY DAY BEFORE BREAKFAST 30 tablet 1  . HYDROcodone-acetaminophen (NORCO) 10-325 MG tablet Take 1-2 tablets by mouth every 4 (four) hours as needed for moderate pain. Maximum dose per 24 hours - 8 pills 10 tablet 0  . losartan-hydrochlorothiazide (HYZAAR) 100-25 MG tablet Take 1 tablet by mouth daily. (Patient taking differently: Take 1 tablet by mouth daily. ) 90 tablet 3  . Multiple Vitamin (MULTIVITAMIN) tablet Take 1 tablet by mouth daily.    . prednisoLONE acetate (PRED FORTE) 1 % ophthalmic suspension Place 1 drop into the left eye 4 (four) times daily.     . SitaGLIPtin-MetFORMIN HCl (JANUMET XR) [phone] MG TB24 Take 1 tablet by mouth daily. 90 tablet 1  . sodium chloride (MURO 128) 5 % ophthalmic ointment Place 1 application into both eyes 3 (three) times daily.     . tamsulosin (FLOMAX) 0.4 MG CAPS capsule TAKE  1 CAPSULE (0.4 MG TOTAL) BY MOUTH DAILY. 90 capsule 3   No current facility-administered medications for this encounter.     Physical Findings: In general this is a well appearing Caucasian male in no acute distress. He's alert and oriented x4 and appropriate throughout the examination. Cardiopulmonary assessment is negative for acute distress and he exhibits normal effort.   Lab Findings: Lab Results  Component Value Date   WBC 10.0 05/02/2017   HGB 12.2 (L) 05/06/2017   HCT 36.0 (L) 05/06/2017   PLT 273 05/02/2017    Lab Results  Component Value Date   NA 145 05/06/2017   K 3.6 05/06/2017   CO2 29 05/02/2017   GLUCOSE 143 (H) 05/06/2017   BUN 22 (H) 05/02/2017    CREATININE 1.35 (H) 05/02/2017   BILITOT 0.8 05/02/2017   ALKPHOS 99 05/02/2017   AST 21 05/02/2017   ALT 16 (L) 05/02/2017   PROT 6.5 05/02/2017   ALBUMIN 3.6 05/02/2017   CALCIUM 9.3 05/02/2017   ANIONGAP 6 05/02/2017    Radiographic Findings: Patient underwent CT imaging in our clinic for post implant dosimetry and images were reviewed personally by Dr. Tammi Klippel. The CT appears to demonstrate an adequate distribution of radioactive seeds throughout the prostate gland. There are no seeds in or near the rectum. We suspect the final radiation plan and dosimetry will show appropriate coverage of the prostate gland.   Impression:  The patient is recovering from the effects of radiation. His urinary symptoms should gradually improve over the next 4-6 months. We talked about this today. He is encouraged by his improvement already and is otherwise pleased with his outcome.   Plan: Today, I spent time talking to the patient about his prostate seed implant and resolving urinary symptoms. He will try increasing his Flomax to twice a day dosing to see if this helps with his BOO sxs.  We also talked about long-term follow-up for prostate cancer following seed implant. He understands that ongoing PSA determinations and digital rectal exams will help perform surveillance to rule out disease recurrence. He understands what to expect with his PSA measures. Patient was also educated today about some of the long-term effects from radiation including a small risk for rectal bleeding and possibly erectile dysfunction. We talked about some of the general management approaches to these potential complications. However, I did encourage the patient to contact our office or return at any point if he has questions or concerns related to his previous radiation and prostate cancer.    Nicholos Johns, PA-C    Tyler Pita, MD  [facility] Direct Dial: [phone]  Fax:  [phone] [email or web address]  Skype  LinkedIn    Page Me

## 2017-05-27 NOTE — Progress Notes (Signed)
  Radiation Oncology         [phone] ________________________________  Name: [name] MRN: [id number]  Date: 05/27/2017  DOB: Jun 18, 1931  COMPLEX SIMULATION NOTE  NARRATIVE:  The patient was brought to the [place] suite today following prostate seed implantation approximately one month ago.  Identity was confirmed.  All relevant records and images related to the planned course of therapy were reviewed.  Then, the patient was set-up supine.  CT images were obtained.  The CT images were loaded into the planning software.  Then the prostate and rectum were contoured.  Treatment planning then occurred.  The implanted iodine 125 seeds were identified by the physics staff for projection of radiation distribution  I have requested : 3D Simulation  I have requested a DVH of the following structures: Prostate and rectum.    ________________________________  Sheral Apley Tammi Klippel, M.D.   This document serves as a record of services personally performed by Tyler Pita MD. It was created on his behalf by Delton Coombes, a trained medical scribe. The creation of this record is based on the scribe's personal observations and the provider's statements to them.

## 2017-05-27 NOTE — Progress Notes (Signed)
Weight and vitals stable. Denies pain. Pre seed IPSS 5. Post seed IPSS 28. Reports he was evaluated by Dr. Simone Curia NP earlier today and told he has retention. He goes onto explain he was prescribed flomax despite the fact he has been taking it for sometime now. Patient questions if he should increase his dosage. Reports dysuria that is worse at the head of his penis. Denies hematuria. Denies leakage or incontinence. Patient reports he is scheduled to follow up with [facility] in 3 month. Also, patient is scheduled for an MRI at 3 pm today.   BP (!) 150/81 (BP Location: Right Arm, Patient Position: Sitting, Cuff Size: Normal)   Pulse 76   Resp 18   Wt 133 lb 6.4 oz (60.5 kg)   SpO2 100%   BMI 22.90 kg/m  Wt Readings from Last 3 Encounters:  05/27/17 133 lb 6.4 oz (60.5 kg)  05/06/17 131 lb 8 oz (59.6 kg)  04/01/17 126 lb (57.2 kg)

## 2017-06-03 ENCOUNTER — Encounter: Payer: Self-pay | Admitting: Radiation Oncology

## 2017-06-03 DIAGNOSIS — Z955 Presence of coronary angioplasty implant and graft: Secondary | ICD-10-CM | POA: Diagnosis not present

## 2017-06-03 DIAGNOSIS — C61 Malignant neoplasm of prostate: Secondary | ICD-10-CM | POA: Diagnosis not present

## 2017-06-03 DIAGNOSIS — E119 Type 2 diabetes mellitus without complications: Secondary | ICD-10-CM | POA: Diagnosis not present

## 2017-06-03 DIAGNOSIS — Z9889 Other specified postprocedural states: Secondary | ICD-10-CM | POA: Diagnosis not present

## 2017-06-03 DIAGNOSIS — Z86010 Personal history of colonic polyps: Secondary | ICD-10-CM | POA: Diagnosis not present

## 2017-06-03 DIAGNOSIS — E785 Hyperlipidemia, unspecified: Secondary | ICD-10-CM | POA: Diagnosis not present

## 2017-06-03 DIAGNOSIS — I10 Essential (primary) hypertension: Secondary | ICD-10-CM | POA: Diagnosis not present

## 2017-06-03 DIAGNOSIS — Z510 Encounter for antineoplastic radiation therapy: Secondary | ICD-10-CM | POA: Diagnosis not present

## 2017-06-03 DIAGNOSIS — I2510 Atherosclerotic heart disease of native coronary artery without angina pectoris: Secondary | ICD-10-CM | POA: Diagnosis not present

## 2017-06-07 ENCOUNTER — Other Ambulatory Visit: Payer: Self-pay | Admitting: Family Medicine

## 2017-06-07 DIAGNOSIS — E1151 Type 2 diabetes mellitus with diabetic peripheral angiopathy without gangrene: Secondary | ICD-10-CM

## 2017-06-19 NOTE — Progress Notes (Signed)
  Radiation Oncology         [phone] ________________________________  Name: [name] MRN: [id number]  Date: 06/03/2017  DOB: [birth year]  3D Planning Note   Prostate Brachytherapy Post-Implant Dosimetry  Diagnosis: 84 y.o. gentleman with Stage T2c adenocarcinoma of the prostate with Gleason Score of 3+4, and PSA of 9.32  Narrative: On a previous date, [name] returned following prostate seed implantation for post implant planning. He underwent CT scan complex simulation to delineate the three-dimensional structures of the pelvis and demonstrate the radiation distribution.  Since that time, the seed localization, and complex isodose planning with dose volume histograms have now been completed.  Results:   Prostate Coverage - The dose of radiation delivered to the 90% or more of the prostate gland (D90) was 96.52% of the prescription dose. This exceeds our goal of greater than 90%. Rectal Sparing - The volume of rectal tissue receiving the prescription dose or higher was 0.0 cc. This falls under our thresholds tolerance of 1.0 cc.  Impression: The prostate seed implant appears to show adequate target coverage and appropriate rectal sparing.  Plan:  The patient will continue to follow with urology for ongoing PSA determinations. I would anticipate a high likelihood for local tumor control with minimal risk for rectal morbidity.  ________________________________  Sheral Apley Tammi Klippel, M.D.

## 2017-07-09 ENCOUNTER — Other Ambulatory Visit: Payer: Self-pay | Admitting: Family Medicine

## 2017-07-09 DIAGNOSIS — I10 Essential (primary) hypertension: Secondary | ICD-10-CM

## 2017-07-20 DIAGNOSIS — H26492 Other secondary cataract, left eye: Secondary | ICD-10-CM | POA: Diagnosis not present

## 2017-07-20 DIAGNOSIS — Z947 Corneal transplant status: Secondary | ICD-10-CM | POA: Diagnosis not present

## 2017-07-20 DIAGNOSIS — Z961 Presence of intraocular lens: Secondary | ICD-10-CM | POA: Diagnosis not present

## 2017-08-01 ENCOUNTER — Other Ambulatory Visit: Payer: Self-pay | Admitting: Family Medicine

## 2017-08-01 DIAGNOSIS — E1151 Type 2 diabetes mellitus with diabetic peripheral angiopathy without gangrene: Secondary | ICD-10-CM

## 2017-08-11 ENCOUNTER — Other Ambulatory Visit: Payer: Self-pay | Admitting: Family Medicine

## 2017-08-11 DIAGNOSIS — E785 Hyperlipidemia, unspecified: Secondary | ICD-10-CM

## 2017-08-26 DIAGNOSIS — C61 Malignant neoplasm of prostate: Secondary | ICD-10-CM | POA: Diagnosis not present

## 2017-09-01 DIAGNOSIS — R3914 Feeling of incomplete bladder emptying: Secondary | ICD-10-CM | POA: Diagnosis not present

## 2017-09-01 DIAGNOSIS — Z8546 Personal history of malignant neoplasm of prostate: Secondary | ICD-10-CM | POA: Diagnosis not present

## 2017-09-09 ENCOUNTER — Other Ambulatory Visit: Payer: Self-pay | Admitting: *Deleted

## 2017-09-09 DIAGNOSIS — I10 Essential (primary) hypertension: Secondary | ICD-10-CM

## 2017-09-09 MED ORDER — AMLODIPINE BESYLATE 5 MG PO TABS: ORAL_TABLET | ORAL | 0 refills | Status: DC

## 2017-09-24 ENCOUNTER — Other Ambulatory Visit: Payer: Self-pay | Admitting: Family Medicine

## 2017-09-24 DIAGNOSIS — E1151 Type 2 diabetes mellitus with diabetic peripheral angiopathy without gangrene: Secondary | ICD-10-CM

## 2017-09-26 ENCOUNTER — Ambulatory Visit: Payer: Medicare HMO | Admitting: Family Medicine

## 2017-09-26 ENCOUNTER — Encounter: Payer: Self-pay | Admitting: Family Medicine

## 2017-09-26 VITALS — BP 152/62 | HR 65 | Temp 97.6°F | Resp 16 | Ht 64.0 in | Wt 131.4 lb

## 2017-09-26 DIAGNOSIS — E1136 Type 2 diabetes mellitus with diabetic cataract: Secondary | ICD-10-CM

## 2017-09-26 DIAGNOSIS — E785 Hyperlipidemia, unspecified: Secondary | ICD-10-CM

## 2017-09-26 DIAGNOSIS — I10 Essential (primary) hypertension: Secondary | ICD-10-CM | POA: Diagnosis not present

## 2017-09-26 LAB — HEMOGLOBIN A1C: Hgb A1c MFr Bld: 7.9 % — ABNORMAL HIGH (ref 4.6–6.5)

## 2017-09-26 LAB — LIPID PANEL
Cholesterol: 129 mg/dL (ref 0–200)
HDL: 46.9 mg/dL (ref >39.00)
LDL Cholesterol: 67 mg/dL (ref 0–99)
NonHDL: 82.46
TRIGLYCERIDES: 78 mg/dL (ref 0.0–149.0)
Total CHOL/HDL Ratio: 3
VLDL: 15.6 mg/dL (ref 0.0–40.0)

## 2017-09-26 LAB — COMPREHENSIVE METABOLIC PANEL
ALK PHOS: 82 U/L (ref 39–117)
ALT: 17 U/L (ref 0–53)
AST: 22 U/L (ref 0–37)
Albumin: 3.9 g/dL (ref 3.5–5.2)
BILIRUBIN TOTAL: 0.5 mg/dL (ref 0.2–1.2)
BUN: 24 mg/dL — ABNORMAL HIGH (ref 6–23)
CALCIUM: 9.4 mg/dL (ref 8.4–10.5)
CO2: 31 mEq/L (ref 19–32)
Chloride: 105 mEq/L (ref 96–112)
Creatinine, Ser: 1.24 mg/dL (ref 0.40–1.50)
GFR: 59.08 mL/min — AB (ref >60.00)
Glucose, Bld: 154 mg/dL — ABNORMAL HIGH (ref 70–99)
Potassium: 4.3 mEq/L (ref 3.5–5.1)
Sodium: 144 mEq/L (ref 135–145)
TOTAL PROTEIN: 6.6 g/dL (ref 6.0–8.3)

## 2017-09-26 NOTE — Progress Notes (Signed)
Patient ID: CURBY CARSWELL, male   DOB: 14-Mar-1932, 84 y.o.   MRN: [id number]     Subjective:  I acted as a Education administrator for Dr. Carollee Herter.  Guerry Bruin, Jeannette   Patient ID: WILLE AUBUCHON, male    DOB: [birth year], 84 y.o.   MRN: [id number]  Chief Complaint  Patient presents with  . Hypertension  . Hyperlipidemia  . Diabetes    HPI  Patient is in today for follow up blood pressure, cholesterol, and diabetes.  He is doing well on current treatment.  HPI HYPERTENSION   Blood pressure range-not checking   Chest pain- no      Dyspnea- no Lightheadedness- no   Edema- no  Other side effects - no   Medication compliance: good Low salt diet- yes    DIABETES    Blood Sugar ranges-not checking   Polyuria- no New Visual problems- no  Hypoglycemic symptoms- no  Other side effects-good Medication compliance - good Last eye exam- annually Foot exam- today   HYPERLIPIDEMIA  Medication compliance- good RUQ pain- no  Muscle aches- no Other side effects-no   Patient Care Team: [name], DO as PCP - General (Family Medicine) Monna Fam, MD as Consulting Physician (Ophthalmology) Kathie Rhodes, MD as Consulting Physician (Urology) Tyler Pita, MD as Consulting Physician (Radiation Oncology)   Past Medical History:  Diagnosis Date  . CAD (coronary artery disease)    LHC 03/25/11 by Dr. Burt Knack:  pLAD 99%, oCFX 20-30%, pOM1 40%, dAVCFX 20-30%.  EF was normal on nuclear study.  He was treated with a Promus DES to his pLAD.   [name] Colon polyps 1996   villous adenoma  . DM type 2 (diabetes mellitus, type 2) ([place]) 2002  . Fuchs' corneal dystrophy   . Hyperlipidemia   . Hypertension   . Macular degeneration    posterior vitreous vitreous detachment  . Prostate cancer [facility]) dx 2018    Past Surgical History:  Procedure Laterality Date  . CARDIAC CATHETERIZATION  03/25/2011  . cataract Bilateral 2010  . corenea implants  june and sept 2018   [name]  . CORONARY  STENT PLACEMENT  03/25/2011  . RADIOACTIVE SEED IMPLANT N/A 05/06/2017   Procedure: RADIOACTIVE SEED IMPLANT/BRACHYTHERAPY IMPLANT;  Surgeon: Kathie Rhodes, MD;  Location: [place];  Service: Urology;  Laterality: N/A;  . SPACE OAR INSTILLATION N/A 05/06/2017   Procedure: SPACE OAR INSTILLATION;  Surgeon: Kathie Rhodes, MD;  Location: [facility];  Service: Urology;  Laterality: N/A;    Family History  Problem Relation Age of Onset  . Coronary artery disease Father 63       deceased  . Stomach cancer Mother 23    Social History   Socioeconomic History  . Marital status: Widowed    Spouse name: Not on file  . Number of children: Not on file  . Years of education: Not on file  . Highest education level: Not on file  Occupational History  . Not on file  Social Needs  . Financial resource strain: Not on file  . Food insecurity:    Worry: Not on file    Inability: Not on file  . Transportation needs:    Medical: Not on file    Non-medical: Not on file  Tobacco Use  . Smoking status: Former Smoker    Packs/day: 0.25    Years: 3.00    Pack years: 0.75    Last attempt to quit: 06/07/1958  Years since quitting: 59.3  . Smokeless tobacco: Never Used  Substance and Sexual Activity  . Alcohol use: Yes    Comment: 1 glass wine per day  . Drug use: No  . Sexual activity: Not Currently  Lifestyle  . Physical activity:    Days per week: Not on file    Minutes per session: Not on file  . Stress: Not on file  Relationships  . Social connections:    Talks on phone: Not on file    Gets together: Not on file    Attends religious service: Not on file    Active member of club or organization: Not on file    Attends meetings of clubs or organizations: Not on file    Relationship status: Not on file  . Intimate partner violence:    Fear of current or ex partner: Not on file    Emotionally abused: Not on file    Physically abused: Not on file     Forced sexual activity: Not on file  Other Topics Concern  . Not on file  Social History Narrative  . Not on file    Outpatient Medications Prior to Visit  Medication Sig Dispense Refill  . amLODipine (NORVASC) 5 MG tablet TAKE 1 TABLET (5 MG TOTAL) BY MOUTH DAILY. 90 tablet 0  . aspirin 81 MG tablet Take 81 mg by mouth daily.    [name] atorvastatin (LIPITOR) 10 MG tablet TAKE 1 TABLET BY MOUTH EVERY DAY 90 tablet 0  . brimonidine-timolol (COMBIGAN) 0.2-0.5 % ophthalmic solution Place 1 drop into the right eye 2 (two) times daily.     . fish oil-omega-3 fatty acids 1000 MG capsule Take 2 g by mouth daily.      [name] glimepiride (AMARYL) 2 MG tablet TAKE 1 TABLET BY MOUTH EVERY DAY BEFORE BREAKFAST 30 tablet 1  . losartan-hydrochlorothiazide (HYZAAR) 100-25 MG tablet TAKE 1 TABLET BY MOUTH DAILY. 90 tablet 1  . Multiple Vitamin (MULTIVITAMIN) tablet Take 1 tablet by mouth daily.    . prednisoLONE acetate (PRED FORTE) 1 % ophthalmic suspension Place 1 drop into the left eye 4 (four) times daily.     . SitaGLIPtin-MetFORMIN HCl (JANUMET XR) [phone] MG TB24 Take 1 tablet by mouth daily. 90 tablet 1  . sodium chloride (MURO 128) 5 % ophthalmic ointment Place 1 application into both eyes 3 (three) times daily.     . tamsulosin (FLOMAX) 0.4 MG CAPS capsule TAKE 1 CAPSULE (0.4 MG TOTAL) BY MOUTH DAILY. 90 capsule 3  . ciprofloxacin (CIPRO) 500 MG tablet Take 1 tablet (500 mg total) by mouth 2 (two) times daily. 6 tablet 0  . HYDROcodone-acetaminophen (NORCO) 10-325 MG tablet Take 1-2 tablets by mouth every 4 (four) hours as needed for moderate pain. Maximum dose per 24 hours - 8 pills 10 tablet 0   No facility-administered medications prior to visit.     No Known Allergies  Review of Systems  Constitutional: Negative for chills, fever and malaise/fatigue.  HENT: Negative for congestion and hearing loss.   Eyes: Negative for blurred vision and discharge.  Respiratory: Negative for cough, sputum  production and shortness of breath.   Cardiovascular: Negative for chest pain, palpitations and leg swelling.  Gastrointestinal: Negative for abdominal pain, blood in stool, constipation, diarrhea, heartburn, nausea and vomiting.  Genitourinary: Negative for dysuria, frequency, hematuria and urgency.  Musculoskeletal: Negative for back pain, falls and myalgias.  Skin: Negative for rash.  Neurological: Negative for dizziness, sensory change, loss  of consciousness, weakness and headaches.  Endo/Heme/Allergies: Negative for environmental allergies. Does not bruise/bleed easily.  Psychiatric/Behavioral: Negative for depression and suicidal ideas. The patient is not nervous/anxious and does not have insomnia.        Objective:    Physical Exam  Constitutional: He is oriented to person, place, and time. Vital signs are normal. He appears well-developed and well-nourished. He is sleeping.  HENT:  Head: Normocephalic and atraumatic.  Mouth/Throat: Oropharynx is clear and moist.  Eyes: Pupils are equal, round, and reactive to light. EOM are normal.  Neck: Normal range of motion. Neck supple. No thyromegaly present.  Cardiovascular: Normal rate and regular rhythm.  No murmur heard. Pulmonary/Chest: Effort normal and breath sounds normal. No respiratory distress. He has no wheezes. He has no rales. He exhibits no tenderness.  Musculoskeletal: He exhibits no edema or tenderness.  Neurological: He is alert and oriented to person, place, and time.  Skin: Skin is warm and dry.  Psychiatric: He has a normal mood and affect. His behavior is normal. Judgment and thought content normal.  Nursing note and vitals reviewed.  Diabetic Foot Exam - Simple   Simple Foot Form Diabetic Foot exam was performed with the following findings:  Yes 09/26/2017  8:52 AM  Visual Inspection No deformities, no ulcerations, no other skin breakdown bilaterally:  Yes Sensation Testing Intact to touch and monofilament  testing bilaterally:  Yes Pulse Check Posterior Tibialis and Dorsalis pulse intact bilaterally:  Yes Comments      BP (!) 152/62   Pulse 65   Temp 97.6 F (36.4 C) (Oral)   Resp 16   Ht 5\' 4"  (1.626 m)   Wt 131 lb 6.4 oz (59.6 kg)   SpO2 97%   BMI 22.55 kg/m  Wt Readings from Last 3 Encounters:  09/26/17 131 lb 6.4 oz (59.6 kg)  05/27/17 133 lb 6.4 oz (60.5 kg)  05/06/17 131 lb 8 oz (59.6 kg)   BP Readings from Last 3 Encounters:  09/26/17 (!) 152/62  05/27/17 (!) 150/81  05/06/17 (!) 161/80     Immunization History  Administered Date(s) Administered  . Influenza Split 03/05/2011, 04/24/2012  . Influenza Whole 02/27/2008, 03/04/2010  . Influenza, High Dose Seasonal PF 04/01/2017  . Influenza,inj,quad, With Preservative 03/07/2013  . Influenza-Unspecified 03/08/2015  . Pneumococcal Conjugate-13 08/26/2014  . Pneumococcal Polysaccharide-23 02/27/2008    Health Maintenance  Topic Date Due  . TETANUS/TDAP  02/03/1953  . HEMOGLOBIN A1C  09/30/2017  . INFLUENZA VACCINE  01/05/2018  . OPHTHALMOLOGY EXAM  03/23/2018  . FOOT EXAM  09/27/2018  . PNA vac Low Risk Adult  Completed    Lab Results  Component Value Date   WBC 10.0 05/02/2017   HGB 12.2 (L) 05/06/2017   HCT 36.0 (L) 05/06/2017   PLT 273 05/02/2017   GLUCOSE 143 (H) 05/06/2017   CHOL 161 04/01/2017   TRIG 152.0 (H) 04/01/2017   HDL 47.60 04/01/2017   LDLCALC 83 04/01/2017   ALT 16 (L) 05/02/2017   AST 21 05/02/2017   NA 145 05/06/2017   K 3.6 05/06/2017   CL 106 05/02/2017   CREATININE 1.35 (H) 05/02/2017   BUN 22 (H) 05/02/2017   CO2 29 05/02/2017   TSH 2.47 10/20/2009   PSA 9.32 (H) 08/30/2016   INR 1.03 05/02/2017   HGBA1C 7.3 (H) 04/01/2017   MICROALBUR 4.6 (H) 08/30/2016    Lab Results  Component Value Date   TSH 2.47 10/20/2009   Lab Results  Component  Value Date   WBC 10.0 05/02/2017   HGB 12.2 (L) 05/06/2017   HCT 36.0 (L) 05/06/2017   MCV 89.2 05/02/2017   PLT 273  05/02/2017   Lab Results  Component Value Date   NA 145 05/06/2017   K 3.6 05/06/2017   CO2 29 05/02/2017   GLUCOSE 143 (H) 05/06/2017   BUN 22 (H) 05/02/2017   CREATININE 1.35 (H) 05/02/2017   BILITOT 0.8 05/02/2017   ALKPHOS 99 05/02/2017   AST 21 05/02/2017   ALT 16 (L) 05/02/2017   PROT 6.5 05/02/2017   ALBUMIN 3.6 05/02/2017   CALCIUM 9.3 05/02/2017   ANIONGAP 6 05/02/2017   GFR 67.92 04/01/2017   Lab Results  Component Value Date   CHOL 161 04/01/2017   Lab Results  Component Value Date   HDL 47.60 04/01/2017   Lab Results  Component Value Date   LDLCALC 83 04/01/2017   Lab Results  Component Value Date   TRIG 152.0 (H) 04/01/2017   Lab Results  Component Value Date   CHOLHDL 3 04/01/2017   Lab Results  Component Value Date   HGBA1C 7.3 (H) 04/01/2017         Assessment & Plan:   Problem List Items Addressed This Visit      Unprioritized   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, without long-term current use of insulin ([place])    hgba1c to be checked, minimize simple carbs. Increase exercise as tolerated. Continue current meds      Relevant Orders   Comprehensive metabolic panel   Lipid panel   Essential hypertension    Well controlled, no changes to meds. Encouraged heart healthy diet such as the DASH diet and exercise as tolerated.       Relevant Orders   Comprehensive metabolic panel   Lipid panel   Hyperlipidemia    Tolerating statin, encouraged heart healthy diet, avoid trans fats, minimize simple carbs and saturated fats. Increase exercise as tolerated       Other Visit Diagnoses    Hyperlipidemia LDL goal <100    -  Primary   Relevant Orders   Hemoglobin A1c   Comprehensive metabolic panel   Lipid panel      I have discontinued Tomma Lightning HYDROcodone-acetaminophen and ciprofloxacin. I am also having him maintain his fish oil-omega-3 fatty acids, aspirin, sodium chloride, multivitamin,  brimonidine-timolol, SitaGLIPtin-MetFORMIN HCl, tamsulosin, prednisoLONE acetate, losartan-hydrochlorothiazide, glimepiride, atorvastatin, and amLODipine.  No orders of the defined types were placed in this encounter.   CMA served as Education administrator during this visit. History, Physical and Plan performed by medical provider. Documentation and orders reviewed and attested to.  [name], DO

## 2017-09-26 NOTE — Assessment & Plan Note (Signed)
Well controlled, no changes to meds. Encouraged heart healthy diet such as the DASH diet and exercise as tolerated.  °

## 2017-09-26 NOTE — Assessment & Plan Note (Signed)
Tolerating statin, encouraged heart healthy diet, avoid trans fats, minimize simple carbs and saturated fats. Increase exercise as tolerated 

## 2017-09-26 NOTE — Assessment & Plan Note (Signed)
hgba1c to be checked, minimize simple carbs. Increase exercise as tolerated. Continue current meds  

## 2017-09-26 NOTE — Patient Instructions (Signed)

## 2017-09-28 ENCOUNTER — Other Ambulatory Visit: Payer: Self-pay

## 2017-09-28 DIAGNOSIS — E119 Type 2 diabetes mellitus without complications: Secondary | ICD-10-CM

## 2017-09-28 DIAGNOSIS — E785 Hyperlipidemia, unspecified: Secondary | ICD-10-CM

## 2017-11-13 ENCOUNTER — Other Ambulatory Visit: Payer: Self-pay | Admitting: Family Medicine

## 2017-11-13 DIAGNOSIS — E1151 Type 2 diabetes mellitus with diabetic peripheral angiopathy without gangrene: Secondary | ICD-10-CM

## 2017-11-13 DIAGNOSIS — E785 Hyperlipidemia, unspecified: Secondary | ICD-10-CM

## 2017-12-11 ENCOUNTER — Other Ambulatory Visit: Payer: Self-pay | Admitting: Family Medicine

## 2017-12-11 DIAGNOSIS — I10 Essential (primary) hypertension: Secondary | ICD-10-CM

## 2017-12-12 ENCOUNTER — Other Ambulatory Visit: Payer: Self-pay | Admitting: *Deleted

## 2017-12-12 DIAGNOSIS — E1151 Type 2 diabetes mellitus with diabetic peripheral angiopathy without gangrene: Secondary | ICD-10-CM

## 2017-12-12 MED ORDER — GLIMEPIRIDE 2 MG PO TABS: ORAL_TABLET | ORAL | 1 refills | Status: DC

## 2018-01-25 DIAGNOSIS — Z947 Corneal transplant status: Secondary | ICD-10-CM | POA: Diagnosis not present

## 2018-01-25 DIAGNOSIS — H26492 Other secondary cataract, left eye: Secondary | ICD-10-CM | POA: Diagnosis not present

## 2018-01-25 DIAGNOSIS — Z961 Presence of intraocular lens: Secondary | ICD-10-CM | POA: Diagnosis not present

## 2018-02-03 DIAGNOSIS — C61 Malignant neoplasm of prostate: Secondary | ICD-10-CM | POA: Diagnosis not present

## 2018-02-09 DIAGNOSIS — Z8546 Personal history of malignant neoplasm of prostate: Secondary | ICD-10-CM | POA: Diagnosis not present

## 2018-03-02 ENCOUNTER — Other Ambulatory Visit: Payer: Self-pay | Admitting: Family Medicine

## 2018-03-02 DIAGNOSIS — I10 Essential (primary) hypertension: Secondary | ICD-10-CM

## 2018-03-05 ENCOUNTER — Other Ambulatory Visit: Payer: Self-pay | Admitting: Family Medicine

## 2018-03-05 DIAGNOSIS — E785 Hyperlipidemia, unspecified: Secondary | ICD-10-CM

## 2018-03-06 DIAGNOSIS — H401134 Primary open-angle glaucoma, bilateral, indeterminate stage: Secondary | ICD-10-CM | POA: Diagnosis not present

## 2018-03-31 ENCOUNTER — Encounter: Payer: Medicare HMO | Admitting: Family Medicine

## 2018-04-03 ENCOUNTER — Other Ambulatory Visit: Payer: Self-pay | Admitting: *Deleted

## 2018-04-03 DIAGNOSIS — E1151 Type 2 diabetes mellitus with diabetic peripheral angiopathy without gangrene: Secondary | ICD-10-CM

## 2018-04-03 DIAGNOSIS — I10 Essential (primary) hypertension: Secondary | ICD-10-CM

## 2018-04-03 DIAGNOSIS — E785 Hyperlipidemia, unspecified: Secondary | ICD-10-CM

## 2018-04-03 MED ORDER — ATORVASTATIN CALCIUM 10 MG PO TABS: 10.0000 mg | ORAL_TABLET | Freq: Every day | ORAL | 1 refills | Status: DC

## 2018-04-03 MED ORDER — AMLODIPINE BESYLATE 5 MG PO TABS: 5.0000 mg | ORAL_TABLET | Freq: Every day | ORAL | 1 refills | Status: DC

## 2018-04-03 MED ORDER — GLIMEPIRIDE 2 MG PO TABS: ORAL_TABLET | ORAL | 1 refills | Status: DC

## 2018-04-03 NOTE — Telephone Encounter (Signed)
Received new Rx requests from [facility] for: amlodipine 5mg , atorvastatin10mg  and glimepiride 2mg . RXs sent.

## 2018-04-04 ENCOUNTER — Encounter: Payer: Self-pay | Admitting: Family Medicine

## 2018-04-04 ENCOUNTER — Ambulatory Visit (INDEPENDENT_AMBULATORY_CARE_PROVIDER_SITE_OTHER): Payer: Medicare HMO | Admitting: Family Medicine

## 2018-04-04 VITALS — BP 142/63 | HR 65 | Temp 97.9°F | Resp 16 | Ht 67.0 in | Wt 136.0 lb

## 2018-04-04 DIAGNOSIS — E1136 Type 2 diabetes mellitus with diabetic cataract: Secondary | ICD-10-CM

## 2018-04-04 DIAGNOSIS — Z0000 Encounter for general adult medical examination without abnormal findings: Secondary | ICD-10-CM

## 2018-04-04 DIAGNOSIS — D229 Melanocytic nevi, unspecified: Secondary | ICD-10-CM

## 2018-04-04 DIAGNOSIS — E785 Hyperlipidemia, unspecified: Secondary | ICD-10-CM | POA: Diagnosis not present

## 2018-04-04 DIAGNOSIS — E119 Type 2 diabetes mellitus without complications: Secondary | ICD-10-CM

## 2018-04-04 DIAGNOSIS — I10 Essential (primary) hypertension: Secondary | ICD-10-CM | POA: Diagnosis not present

## 2018-04-04 DIAGNOSIS — C61 Malignant neoplasm of prostate: Secondary | ICD-10-CM | POA: Diagnosis not present

## 2018-04-04 LAB — COMPREHENSIVE METABOLIC PANEL
ALBUMIN: 4.4 g/dL (ref 3.5–5.2)
ALK PHOS: 77 U/L (ref 39–117)
ALT: 16 U/L (ref 0–53)
AST: 16 U/L (ref 0–37)
BILIRUBIN TOTAL: 0.8 mg/dL (ref 0.2–1.2)
BUN: 30 mg/dL — ABNORMAL HIGH (ref 6–23)
CALCIUM: 9.9 mg/dL (ref 8.4–10.5)
CO2: 34 meq/L — AB (ref 19–32)
CREATININE: 1.36 mg/dL (ref 0.40–1.50)
Chloride: 103 mEq/L (ref 96–112)
GFR: 53.04 mL/min — AB (ref >60.00)
Glucose, Bld: 98 mg/dL (ref 70–99)
Potassium: 4.5 mEq/L (ref 3.5–5.1)
Sodium: 142 mEq/L (ref 135–145)
TOTAL PROTEIN: 6.9 g/dL (ref 6.0–8.3)

## 2018-04-04 LAB — LIPID PANEL
Cholesterol: 142 mg/dL (ref 0–200)
HDL: 50.2 mg/dL (ref >39.00)
LDL Cholesterol: 73 mg/dL (ref 0–99)
NONHDL: 92.11
TRIGLYCERIDES: 94 mg/dL (ref 0.0–149.0)
Total CHOL/HDL Ratio: 3
VLDL: 18.8 mg/dL (ref 0.0–40.0)

## 2018-04-04 LAB — CBC WITH DIFFERENTIAL/PLATELET
BASOS ABS: 0.1 10*3/uL (ref 0.0–0.1)
Basophils Relative: 0.9 % (ref 0.0–3.0)
Eosinophils Absolute: 0.3 10*3/uL (ref 0.0–0.7)
Eosinophils Relative: 2.4 % (ref 0.0–5.0)
HEMATOCRIT: 39.9 % (ref 39.0–52.0)
HEMOGLOBIN: 13.3 g/dL (ref 13.0–17.0)
LYMPHS PCT: 27.3 % (ref 12.0–46.0)
Lymphs Abs: 2.9 10*3/uL (ref 0.7–4.0)
MCHC: 33.3 g/dL (ref 30.0–36.0)
MCV: 90.4 fl (ref 78.0–100.0)
MONOS PCT: 9.7 % (ref 3.0–12.0)
Monocytes Absolute: 1 10*3/uL (ref 0.1–1.0)
NEUTROS ABS: 6.4 10*3/uL (ref 1.4–7.7)
Neutrophils Relative %: 59.7 % (ref 43.0–77.0)
PLATELETS: 249 10*3/uL (ref 150.0–400.0)
RBC: 4.42 Mil/uL (ref 4.22–5.81)
RDW: 14.5 % (ref 11.5–15.5)
WBC: 10.7 10*3/uL — AB (ref 4.0–10.5)

## 2018-04-04 LAB — HEMOGLOBIN A1C: Hgb A1c MFr Bld: 7.4 % — ABNORMAL HIGH (ref 4.6–6.5)

## 2018-04-04 MED ORDER — ZOSTER VAC RECOMB ADJUVANTED 50 MCG/0.5ML IM SUSR: 0.5000 mL | Freq: Once | INTRAMUSCULAR | 1 refills | Status: AC

## 2018-04-04 NOTE — Assessment & Plan Note (Signed)
Lab Results  Component Value Date   PSA 9.32 (H) 08/30/2016   PSA 3.74 02/28/2014   PSA 3.53 10/20/2009   Last psa in aug 2019  0.6  Per urology

## 2018-04-04 NOTE — Assessment & Plan Note (Signed)
Well controlled, no changes to meds. Encouraged heart healthy diet such as the DASH diet and exercise as tolerated.  °

## 2018-04-04 NOTE — Patient Instructions (Signed)
Preventive Care 82 Years and Older, Male Preventive care refers to lifestyle choices and visits with your health care provider that can promote health and wellness. What does preventive care include?  A yearly physical exam. This is also called an annual well check.  Dental exams once or twice a year.  Routine eye exams. Ask your health care provider how often you should have your eyes checked.  Personal lifestyle choices, including: ? Daily care of your teeth and gums. ? Regular physical activity. ? Eating a healthy diet. ? Avoiding tobacco and drug use. ? Limiting alcohol use. ? Practicing safe sex. ? Taking low doses of aspirin every day. ? Taking vitamin and mineral supplements as recommended by your health care provider. What happens during an annual well check? The services and screenings done by your health care provider during your annual well check will depend on your age, overall health, lifestyle risk factors, and family history of disease. Counseling Your health care provider may ask you questions about your:  Alcohol use.  Tobacco use.  Drug use.  Emotional well-being.  Home and relationship well-being.  Sexual activity.  Eating habits.  History of falls.  Memory and ability to understand (cognition).  Work and work environment.  Screening You may have the following tests or measurements:  Height, weight, and BMI.  Blood pressure.  Lipid and cholesterol levels. These may be checked every 5 years, or more frequently if you are over 50 years old.  Skin check.  Lung cancer screening. You may have this screening every year starting at age 55 if you have a 30-pack-year history of smoking and currently smoke or have quit within the past 15 years.  Fecal occult blood test (FOBT) of the stool. You may have this test every year starting at age 50.  Flexible sigmoidoscopy or colonoscopy. You may have a sigmoidoscopy every 5 years or a colonoscopy every 10  years starting at age 50.  Prostate cancer screening. Recommendations will vary depending on your family history and other risks.  Hepatitis C blood test.  Hepatitis B blood test.  Sexually transmitted disease (STD) testing.  Diabetes screening. This is done by checking your blood sugar (glucose) after you have not eaten for a while (fasting). You may have this done every 1-3 years.  Abdominal aortic aneurysm (AAA) screening. You may need this if you are a current or former smoker.  Osteoporosis. You may be screened starting at age 70 if you are at high risk.  Talk with your health care provider about your test results, treatment options, and if necessary, the need for more tests. Vaccines Your health care provider may recommend certain vaccines, such as:  Influenza vaccine. This is recommended every year.  Tetanus, diphtheria, and acellular pertussis (Tdap, Td) vaccine. You may need a Td booster every 10 years.  Varicella vaccine. You may need this if you have not been vaccinated.  Zoster vaccine. You may need this after age 60.  Measles, mumps, and rubella (MMR) vaccine. You may need at least one dose of MMR if you were born in 1957 or later. You may also need a second dose.  Pneumococcal 13-valent conjugate (PCV13) vaccine. One dose is recommended after age 82.  Pneumococcal polysaccharide (PPSV23) vaccine. One dose is recommended after age 82.  Meningococcal vaccine. You may need this if you have certain conditions.  Hepatitis A vaccine. You may need this if you have certain conditions or if you travel or work in places where you   may be exposed to hepatitis A.  Hepatitis B vaccine. You may need this if you have certain conditions or if you travel or work in places where you may be exposed to hepatitis B.  Haemophilus influenzae type b (Hib) vaccine. You may need this if you have certain risk factors.  Talk to your health care provider about which screenings and vaccines  you need and how often you need them. This information is not intended to replace advice given to you by your health care provider. Make sure you discuss any questions you have with your health care provider. Document Released: 06/20/2015 Document Revised: 02/11/2016 Document Reviewed: 03/25/2015 Elsevier Interactive Patient Education  2018 Elsevier Inc.  

## 2018-04-04 NOTE — Assessment & Plan Note (Signed)
Encouraged heart healthy diet, increase exercise, avoid trans fats, consider a krill oil cap daily 

## 2018-04-04 NOTE — Progress Notes (Signed)
Patient ID: [name], male    DOB: February 01, 1932  Age: 84 y.o. MRN: [id number]    Subjective:  Subjective  HPI [name] presents for cpe and f/u dm, bp and cholesterol .   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   He is doing well.    He saw urology about 1 month ago for prostate and everything was good.     Review of Systems  Constitutional: Negative.  Negative for appetite change, diaphoresis, fatigue and unexpected weight change.  HENT: Negative for congestion, ear pain, hearing loss, nosebleeds, postnasal drip, rhinorrhea, sinus pressure, sneezing and tinnitus.   Eyes: Negative for photophobia, pain, discharge, redness, itching and visual disturbance.  Respiratory: Negative.  Negative for cough, chest tightness, shortness of breath and wheezing.   Cardiovascular: Negative.  Negative for chest pain, palpitations and leg swelling.  Gastrointestinal: Negative for abdominal distention, abdominal pain, anal bleeding, blood in stool and constipation.  Endocrine: Negative.  Negative for cold intolerance, heat intolerance, polydipsia, polyphagia and polyuria.  Genitourinary: Negative.  Negative for difficulty urinating, dysuria and frequency.  Musculoskeletal: Negative.   Skin: Negative.   Allergic/Immunologic: Negative.   Neurological: Negative for dizziness, weakness, light-headedness, numbness and headaches.  Psychiatric/Behavioral: Negative for agitation, confusion, decreased concentration, dysphoric mood, sleep disturbance and suicidal ideas. The patient is not nervous/anxious.     History Past Medical History:  Diagnosis Date  . CAD (coronary artery disease)    LHC 03/25/11 by Dr. Burt Knack:  pLAD 99%, oCFX 20-30%, pOM1 40%, dAVCFX 20-30%.  EF was normal on nuclear study.  He was treated with a Promus DES to his pLAD.   [name] Colon polyps 1996   villous adenoma  . DM type 2 (diabetes mellitus, type 2) ([place]) 2002  . Fuchs' corneal  dystrophy   . Hyperlipidemia   . Hypertension   . Macular degeneration    posterior vitreous vitreous detachment  . Prostate cancer [facility]) dx 2018    He has a past surgical history that includes cataract (Bilateral, 2010); Coronary stent placement (03/25/2011); corenea implants (june and sept 2018); Cardiac catheterization (03/25/2011); Radioactive seed implant (N/A, 05/06/2017); and SPACE OAR INSTILLATION (N/A, 05/06/2017).   His family history includes Coronary artery disease (age of onset: 61) in his father; Stomach cancer (age of onset: 20) in his mother.He reports that he quit smoking about 59 years ago. He has a 0.75 pack-year smoking history. He has never used smokeless tobacco. He reports that he drinks alcohol. He reports that he does not use drugs.  Current Outpatient Medications on File Prior to Visit  Medication Sig Dispense Refill  . amLODipine (NORVASC) 5 MG tablet Take 1 tablet (5 mg total) by mouth daily. 90 tablet 1  . aspirin 81 MG tablet Take 81 mg by mouth daily.    [name] atorvastatin (LIPITOR) 10 MG tablet Take 1 tablet (10 mg total) by mouth daily. 90 tablet 1  . brimonidine-timolol (COMBIGAN) 0.2-0.5 % ophthalmic solution Place 1 drop into the right eye 2 (two) times daily.     . fish oil-omega-3 fatty acids 1000 MG capsule Take 2 g by mouth daily.      [name] glimepiride (AMARYL) 2 MG tablet TAKE 1 TABLET BY MOUTH EVERY DAY BEFORE BREAKFAST 90 tablet 1  . losartan-hydrochlorothiazide (HYZAAR) 100-25 MG tablet TAKE 1 TABLET BY MOUTH EVERY DAY 90 tablet 1  . Multiple Vitamin (MULTIVITAMIN) tablet Take 1 tablet by mouth daily.    [name]  prednisoLONE acetate (PRED FORTE) 1 % ophthalmic suspension Place 1 drop into the left eye 4 (four) times daily.     . SitaGLIPtin-MetFORMIN HCl (JANUMET XR) [phone] MG TB24 Take 1 tablet by mouth daily. 90 tablet 1  . tamsulosin (FLOMAX) 0.4 MG CAPS capsule TAKE 1 CAPSULE (0.4 MG TOTAL) BY MOUTH DAILY. 90 capsule 3   No current facility-administered  medications on file prior to visit.      Objective:  Objective  Physical Exam  Constitutional: He is oriented to person, place, and time. Vital signs are normal. He appears well-developed and well-nourished. He is sleeping.  HENT:  Head: Normocephalic and atraumatic.  Mouth/Throat: Oropharynx is clear and moist.  Eyes: Pupils are equal, round, and reactive to light. EOM are normal.  Neck: Normal range of motion. Neck supple. No thyromegaly present.  Cardiovascular: Normal rate and regular rhythm.  No murmur heard. Pulmonary/Chest: Effort normal and breath sounds normal. No respiratory distress. He has no wheezes. He has no rales. He exhibits no tenderness.  Genitourinary:  Genitourinary Comments: Per urology  Musculoskeletal: He exhibits no edema or tenderness.  Neurological: He is alert and oriented to person, place, and time.  Skin: Skin is warm and dry.  Psychiatric: He has a normal mood and affect. His behavior is normal. Judgment and thought content normal.  Nursing note and vitals reviewed.  BP (!) 142/63 (BP Location: Left Arm, Patient Position: Sitting, Cuff Size: Small)   Pulse 65   Temp 97.9 F (36.6 C) (Oral)   Resp 16   Ht 5\' 7"  (1.702 m)   Wt 136 lb (61.7 kg)   SpO2 99%   BMI 21.30 kg/m  Wt Readings from Last 3 Encounters:  04/04/18 136 lb (61.7 kg)  09/26/17 131 lb 6.4 oz (59.6 kg)  05/27/17 133 lb 6.4 oz (60.5 kg)     Lab Results  Component Value Date   WBC 10.0 05/02/2017   HGB 12.2 (L) 05/06/2017   HCT 36.0 (L) 05/06/2017   PLT 273 05/02/2017   GLUCOSE 154 (H) 09/26/2017   CHOL 129 09/26/2017   TRIG 78.0 09/26/2017   HDL 46.90 09/26/2017   LDLCALC 67 09/26/2017   ALT 17 09/26/2017   AST 22 09/26/2017   NA 144 09/26/2017   K 4.3 09/26/2017   CL 105 09/26/2017   CREATININE 1.24 09/26/2017   BUN 24 (H) 09/26/2017   CO2 31 09/26/2017   TSH 2.47 10/20/2009   PSA 9.32 (H) 08/30/2016   INR 1.03 05/02/2017   HGBA1C 7.9 (H) 09/26/2017    MICROALBUR 4.6 (H) 08/30/2016    No results found.   Assessment & Plan:  Plan  I have discontinued Logon K. Peffley's sodium chloride. I am also having him start on Zoster Vaccine Adjuvanted. Additionally, I am having him maintain his fish oil-omega-3 fatty acids, aspirin, multivitamin, brimonidine-timolol, SitaGLIPtin-MetFORMIN HCl, tamsulosin, prednisoLONE acetate, losartan-hydrochlorothiazide, atorvastatin, glimepiride, and amLODipine.  Meds ordered this encounter  Medications  . Zoster Vaccine Adjuvanted [facility]) injection    Sig: Inject 0.5 mLs into the muscle once for 1 dose.    Dispense:  0.5 mL    Refill:  1    Problem List Items Addressed This Visit      Unprioritized   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, without long-term current use of insulin ([place])    hgba1c to be checked , minimize simple carbs. Increase exercise as tolerated. Continue current meds       Essential hypertension  Well controlled, no changes to meds. Encouraged heart healthy diet such as the DASH diet and exercise as tolerated.       Relevant Orders   CBC with Differential/Platelet   Comprehensive metabolic panel   Hyperlipidemia    Encouraged heart healthy diet, increase exercise, avoid trans fats, consider a krill oil cap daily      Preventative health care - Primary    ghm utd Check labs  See AVS      Prostate cancer [facility])    Lab Results  Component Value Date   PSA 9.32 (H) 08/30/2016   PSA 3.74 02/28/2014   PSA 3.53 10/20/2009   Last psa in aug 2019  0.6  Per urology       Other Visit Diagnoses    Hyperlipidemia LDL goal <70       Relevant Orders   Lipid panel   Hemoglobin A1c   CBC with Differential/Platelet   Comprehensive metabolic panel   Controlled type 2 diabetes mellitus without complication, without long-term current use of insulin (HCC)       Relevant Orders   Hemoglobin A1c   Comprehensive metabolic panel   Suspicious nevus        Relevant Orders   Ambulatory referral to Dermatology      Follow-up: Return in about 6 months (around 10/04/2018), or if symptoms worsen or fail to improve, for hypertension, hyperlipidemia, diabetes II.  [name], DO

## 2018-04-04 NOTE — Assessment & Plan Note (Signed)
ghm utd Check labs See AVS 

## 2018-04-04 NOTE — Assessment & Plan Note (Signed)
hgba1c to be checked, minimize simple carbs. Increase exercise as tolerated. Continue current meds  

## 2018-04-05 ENCOUNTER — Encounter: Payer: Self-pay | Admitting: *Deleted

## 2018-04-07 MED ORDER — LOSARTAN POTASSIUM-HCTZ 100-25 MG PO TABS: 1.0000 | ORAL_TABLET | Freq: Every day | ORAL | 1 refills | Status: DC

## 2018-04-07 MED ORDER — AMLODIPINE BESYLATE 5 MG PO TABS: 5.0000 mg | ORAL_TABLET | Freq: Every day | ORAL | 1 refills | Status: DC

## 2018-04-07 MED ORDER — ATORVASTATIN CALCIUM 10 MG PO TABS: 10.0000 mg | ORAL_TABLET | Freq: Every day | ORAL | 1 refills | Status: DC

## 2018-04-07 MED ORDER — GLIMEPIRIDE 2 MG PO TABS: ORAL_TABLET | ORAL | 1 refills | Status: DC

## 2018-04-07 NOTE — Addendum Note (Signed)
Addended by: Wynonia Musty A on: 04/07/2018 04:48 PM   Modules accepted: Orders

## 2018-04-07 NOTE — Telephone Encounter (Signed)
Medications resent in to [facility] for patient.

## 2018-04-07 NOTE — Telephone Encounter (Signed)
[employer] called and stated they never received the medication request, looks like it was sent to [facility]. can all four of these be re-sent to [facility]? Thanks.   losartan-hydrochlorothiazide (HYZAAR) 100-25 MG tablet glimepiride (AMARYL) 2 MG tablet amLODipine (NORVASC) 5 MG tablet atorvastatin (LIPITOR) 10 MG tablet

## 2018-06-22 NOTE — Telephone Encounter (Signed)
Spoke with patient to follow up post brachytherapy.

## 2018-07-31 DIAGNOSIS — Z947 Corneal transplant status: Secondary | ICD-10-CM | POA: Diagnosis not present

## 2018-07-31 DIAGNOSIS — H401134 Primary open-angle glaucoma, bilateral, indeterminate stage: Secondary | ICD-10-CM | POA: Diagnosis not present

## 2018-07-31 DIAGNOSIS — Z961 Presence of intraocular lens: Secondary | ICD-10-CM | POA: Diagnosis not present

## 2018-10-02 ENCOUNTER — Telehealth: Payer: Self-pay | Admitting: *Deleted

## 2018-10-02 NOTE — Telephone Encounter (Signed)
error 

## 2018-10-05 ENCOUNTER — Encounter: Payer: Self-pay | Admitting: Family Medicine

## 2018-10-05 ENCOUNTER — Ambulatory Visit: Payer: Medicare HMO | Admitting: Family Medicine

## 2018-10-05 ENCOUNTER — Other Ambulatory Visit: Payer: Self-pay

## 2018-10-05 ENCOUNTER — Ambulatory Visit (INDEPENDENT_AMBULATORY_CARE_PROVIDER_SITE_OTHER): Payer: Medicare HMO | Admitting: Family Medicine

## 2018-10-05 DIAGNOSIS — E785 Hyperlipidemia, unspecified: Secondary | ICD-10-CM

## 2018-10-05 DIAGNOSIS — I10 Essential (primary) hypertension: Secondary | ICD-10-CM | POA: Diagnosis not present

## 2018-10-05 DIAGNOSIS — E1169 Type 2 diabetes mellitus with other specified complication: Secondary | ICD-10-CM | POA: Diagnosis not present

## 2018-10-05 NOTE — Progress Notes (Signed)
Virtual Visit via Video Note  I connected with [name] on 10/05/18 at  9:00 AM EDT by a video enabled telemedicine application and verified that I am speaking with the correct person using two identifiers.  Location: Patient: home Provider: home   I discussed the limitations of evaluation and management by telemedicine and the availability of in person appointments. The patient expressed understanding and agreed to proceed.  History of Present Illness:  HPI HYPERTENSION   Blood pressure range-usually 110/64    Runs high in dr office   Chest pain- no      Dyspnea- no Lightheadedness- no   Edema- no  Other side effects - no   Medication compliance: good Low salt diet- yes    DIABETES    Blood Sugar ranges- good per pt  Polyuria- no New Visual problems- no  Hypoglycemic symptoms- no  Other side effects-no Medication compliance - good Last eye exam- within las 12 months    HYPERLIPIDEMIA  Medication compliance- good RUQ pain- no  Muscle aches- no Other side effects-no   Past Medical History:  Diagnosis Date  . CAD (coronary artery disease)    LHC 03/25/11 by Dr. Burt Knack:  pLAD 99%, oCFX 20-30%, pOM1 40%, dAVCFX 20-30%.  EF was normal on nuclear study.  He was treated with a Promus DES to his pLAD.   [name] Colon polyps 1996   villous adenoma  . DM type 2 (diabetes mellitus, type 2) ([place]) 2002  . Fuchs' corneal dystrophy   . Hyperlipidemia   . Hypertension   . Macular degeneration    posterior vitreous vitreous detachment  . Prostate cancer [facility]) dx 2018   Outpatient Encounter Medications as of 10/05/2018  Medication Sig  . amLODipine (NORVASC) 5 MG tablet Take 1 tablet (5 mg total) by mouth daily.  [name] aspirin 81 MG tablet Take 81 mg by mouth daily.  [name] atorvastatin (LIPITOR) 10 MG tablet Take 1 tablet (10 mg total) by mouth daily.  . brimonidine-timolol (COMBIGAN) 0.2-0.5 % ophthalmic solution Place 1 drop into the right eye 2 (two) times daily.   . fish oil-omega-3  fatty acids 1000 MG capsule Take 2 g by mouth daily.    [name] glimepiride (AMARYL) 2 MG tablet TAKE 1 TABLET BY MOUTH EVERY DAY BEFORE BREAKFAST  . losartan-hydrochlorothiazide (HYZAAR) 100-25 MG tablet Take 1 tablet by mouth daily.  . Multiple Vitamin (MULTIVITAMIN) tablet Take 1 tablet by mouth daily.  . prednisoLONE acetate (PRED FORTE) 1 % ophthalmic suspension Place 1 drop into the left eye 4 (four) times daily.   . SitaGLIPtin-MetFORMIN HCl (JANUMET XR) [phone] MG TB24 Take 1 tablet by mouth daily.  . tamsulosin (FLOMAX) 0.4 MG CAPS capsule TAKE 1 CAPSULE (0.4 MG TOTAL) BY MOUTH DAILY.   No facility-administered encounter medications on file as of 10/05/2018.       Observations/Objective: 110//61  p 64  130 .5    Afebrile   Assessment and Plan: 1. Controlled type 2 diabetes mellitus with other specified complication, without long-term current use of insulin ([place]) hgba1c to be checked , minimize simple carbs. Increase exercise as tolerated. Continue current meds  - Hemoglobin A1c; Future - Comprehensive metabolic panel; Future - Microalbumin / creatinine urine ratio; Future  2. Essential hypertension Well controlled, no changes to meds. Encouraged heart healthy diet such as the DASH diet and exercise as tolerated.  - Comprehensive metabolic panel; Future - Microalbumin / creatinine urine ratio; Future  3. Hyperlipidemia associated with type 2 diabetes  mellitus ([place]) Tolerating statin, encouraged heart healthy diet, avoid trans fats, minimize simple carbs and saturated fats. Increase exercise as tolerated - Lipid panel; Future   Follow Up Instructions:    I discussed the assessment and treatment plan with the patient. The patient was provided an opportunity to ask questions and all were answered. The patient agreed with the plan and demonstrated an understanding of the instructions.   The patient was advised to call back or seek an in-person evaluation if the symptoms worsen or  if the condition fails to improve as anticipated.  I provided 25 minutes of non-face-to-face time during this encounter.   [name], DO

## 2018-10-06 ENCOUNTER — Other Ambulatory Visit: Payer: Self-pay | Admitting: Family Medicine

## 2018-10-06 DIAGNOSIS — E785 Hyperlipidemia, unspecified: Secondary | ICD-10-CM

## 2018-10-18 ENCOUNTER — Other Ambulatory Visit: Payer: Self-pay | Admitting: Family Medicine

## 2018-10-18 ENCOUNTER — Other Ambulatory Visit (INDEPENDENT_AMBULATORY_CARE_PROVIDER_SITE_OTHER): Payer: Medicare HMO

## 2018-10-18 ENCOUNTER — Other Ambulatory Visit: Payer: Self-pay

## 2018-10-18 DIAGNOSIS — E785 Hyperlipidemia, unspecified: Secondary | ICD-10-CM

## 2018-10-18 DIAGNOSIS — I10 Essential (primary) hypertension: Secondary | ICD-10-CM | POA: Diagnosis not present

## 2018-10-18 DIAGNOSIS — E1169 Type 2 diabetes mellitus with other specified complication: Secondary | ICD-10-CM | POA: Diagnosis not present

## 2018-10-18 DIAGNOSIS — E1151 Type 2 diabetes mellitus with diabetic peripheral angiopathy without gangrene: Secondary | ICD-10-CM

## 2018-10-18 LAB — COMPREHENSIVE METABOLIC PANEL
ALT: 15 U/L (ref 0–53)
AST: 17 U/L (ref 0–37)
Albumin: 4.1 g/dL (ref 3.5–5.2)
Alkaline Phosphatase: 85 U/L (ref 39–117)
BUN: 25 mg/dL — ABNORMAL HIGH (ref 6–23)
CO2: 31 mEq/L (ref 19–32)
Calcium: 9.1 mg/dL (ref 8.4–10.5)
Chloride: 102 mEq/L (ref 96–112)
Creatinine, Ser: 1.42 mg/dL (ref 0.40–1.50)
GFR: 47.42 mL/min — ABNORMAL LOW (ref >60.00)
Glucose, Bld: 139 mg/dL — ABNORMAL HIGH (ref 70–99)
Potassium: 4.1 mEq/L (ref 3.5–5.1)
Sodium: 141 mEq/L (ref 135–145)
Total Bilirubin: 0.7 mg/dL (ref 0.2–1.2)
Total Protein: 6.5 g/dL (ref 6.0–8.3)

## 2018-10-18 LAB — LIPID PANEL
Cholesterol: 132 mg/dL (ref 0–200)
HDL: 42.4 mg/dL (ref >39.00)
LDL Cholesterol: 71 mg/dL (ref 0–99)
NonHDL: 89.11
Total CHOL/HDL Ratio: 3
Triglycerides: 93 mg/dL (ref 0.0–149.0)
VLDL: 18.6 mg/dL (ref 0.0–40.0)

## 2018-10-18 LAB — MICROALBUMIN / CREATININE URINE RATIO
Creatinine,U: 131.4 mg/dL
Microalb Creat Ratio: 33.4 mg/g — ABNORMAL HIGH (ref 0.0–30.0)
Microalb, Ur: 43.9 mg/dL — ABNORMAL HIGH (ref 0.0–1.9)

## 2018-10-18 LAB — HEMOGLOBIN A1C: Hgb A1c MFr Bld: 7.9 % — ABNORMAL HIGH (ref 4.6–6.5)

## 2018-10-24 ENCOUNTER — Other Ambulatory Visit: Payer: Self-pay | Admitting: Family Medicine

## 2018-10-24 DIAGNOSIS — I10 Essential (primary) hypertension: Secondary | ICD-10-CM

## 2018-10-24 DIAGNOSIS — E1165 Type 2 diabetes mellitus with hyperglycemia: Secondary | ICD-10-CM

## 2018-10-24 DIAGNOSIS — E1169 Type 2 diabetes mellitus with other specified complication: Secondary | ICD-10-CM

## 2018-10-24 MED ORDER — GLIMEPIRIDE 4 MG PO TABS: 4.0000 mg | ORAL_TABLET | Freq: Every day | ORAL | 0 refills | Status: DC

## 2018-11-09 DIAGNOSIS — Z8546 Personal history of malignant neoplasm of prostate: Secondary | ICD-10-CM | POA: Diagnosis not present

## 2018-11-15 ENCOUNTER — Other Ambulatory Visit: Payer: Self-pay | Admitting: *Deleted

## 2018-11-15 DIAGNOSIS — I10 Essential (primary) hypertension: Secondary | ICD-10-CM

## 2018-11-15 MED ORDER — TRUEPLUS LANCETS 30G MISC: 1 refills | Status: DC

## 2018-11-15 MED ORDER — ALCOHOL SWABS PADS: MEDICATED_PAD | 1 refills | Status: DC

## 2018-11-15 MED ORDER — TRUE METRIX LEVEL 1 LOW VI SOLN: 0 refills | Status: DC

## 2018-11-15 MED ORDER — GLUCOSE BLOOD VI STRP: ORAL_STRIP | 12 refills | Status: DC

## 2018-11-15 MED ORDER — TRUE METRIX AIR GLUCOSE METER W/DEVICE KIT: 1.0000 | PACK | Freq: Every day | 0 refills | Status: DC

## 2018-11-15 MED ORDER — AMLODIPINE BESYLATE 5 MG PO TABS: 5.0000 mg | ORAL_TABLET | Freq: Every day | ORAL | 1 refills | Status: DC

## 2018-12-25 ENCOUNTER — Other Ambulatory Visit: Payer: Self-pay | Admitting: Family Medicine

## 2018-12-29 ENCOUNTER — Other Ambulatory Visit: Payer: Self-pay | Admitting: Family Medicine

## 2018-12-29 DIAGNOSIS — I10 Essential (primary) hypertension: Secondary | ICD-10-CM

## 2019-01-24 ENCOUNTER — Other Ambulatory Visit: Payer: Medicare HMO

## 2019-01-31 ENCOUNTER — Other Ambulatory Visit (INDEPENDENT_AMBULATORY_CARE_PROVIDER_SITE_OTHER): Payer: Medicare HMO

## 2019-01-31 ENCOUNTER — Other Ambulatory Visit: Payer: Self-pay

## 2019-01-31 DIAGNOSIS — E1169 Type 2 diabetes mellitus with other specified complication: Secondary | ICD-10-CM | POA: Diagnosis not present

## 2019-01-31 DIAGNOSIS — I10 Essential (primary) hypertension: Secondary | ICD-10-CM

## 2019-01-31 DIAGNOSIS — E785 Hyperlipidemia, unspecified: Secondary | ICD-10-CM | POA: Diagnosis not present

## 2019-01-31 DIAGNOSIS — E1165 Type 2 diabetes mellitus with hyperglycemia: Secondary | ICD-10-CM | POA: Diagnosis not present

## 2019-01-31 LAB — COMPREHENSIVE METABOLIC PANEL
ALT: 15 U/L (ref 0–53)
AST: 21 U/L (ref 0–37)
Albumin: 4.3 g/dL (ref 3.5–5.2)
Alkaline Phosphatase: 66 U/L (ref 39–117)
BUN: 37 mg/dL — ABNORMAL HIGH (ref 6–23)
CO2: 28 mEq/L (ref 19–32)
Calcium: 9.6 mg/dL (ref 8.4–10.5)
Chloride: 105 mEq/L (ref 96–112)
Creatinine, Ser: 1.94 mg/dL — ABNORMAL HIGH (ref 0.40–1.50)
GFR: 33.06 mL/min — ABNORMAL LOW (ref >60.00)
Glucose, Bld: 56 mg/dL — ABNORMAL LOW (ref 70–99)
Potassium: 3.6 mEq/L (ref 3.5–5.1)
Sodium: 145 mEq/L (ref 135–145)
Total Bilirubin: 0.7 mg/dL (ref 0.2–1.2)
Total Protein: 6.6 g/dL (ref 6.0–8.3)

## 2019-01-31 LAB — MICROALBUMIN / CREATININE URINE RATIO
Creatinine,U: 149.8 mg/dL
Microalb Creat Ratio: 9.4 mg/g (ref 0.0–30.0)
Microalb, Ur: 14.1 mg/dL — ABNORMAL HIGH (ref 0.0–1.9)

## 2019-01-31 LAB — LIPID PANEL
Cholesterol: 135 mg/dL (ref 0–200)
HDL: 47.7 mg/dL (ref >39.00)
LDL Cholesterol: 73 mg/dL (ref 0–99)
NonHDL: 87.76
Total CHOL/HDL Ratio: 3
Triglycerides: 75 mg/dL (ref 0.0–149.0)
VLDL: 15 mg/dL (ref 0.0–40.0)

## 2019-01-31 LAB — HEMOGLOBIN A1C: Hgb A1c MFr Bld: 7.2 % — ABNORMAL HIGH (ref 4.6–6.5)

## 2019-02-01 DIAGNOSIS — H401112 Primary open-angle glaucoma, right eye, moderate stage: Secondary | ICD-10-CM | POA: Diagnosis not present

## 2019-02-01 DIAGNOSIS — H401121 Primary open-angle glaucoma, left eye, mild stage: Secondary | ICD-10-CM | POA: Diagnosis not present

## 2019-02-04 ENCOUNTER — Other Ambulatory Visit: Payer: Self-pay | Admitting: Family Medicine

## 2019-02-04 DIAGNOSIS — H401121 Primary open-angle glaucoma, left eye, mild stage: Secondary | ICD-10-CM

## 2019-02-04 DIAGNOSIS — H401112 Primary open-angle glaucoma, right eye, moderate stage: Secondary | ICD-10-CM | POA: Insufficient documentation

## 2019-02-04 DIAGNOSIS — R7989 Other specified abnormal findings of blood chemistry: Secondary | ICD-10-CM

## 2019-02-04 DIAGNOSIS — R809 Proteinuria, unspecified: Secondary | ICD-10-CM

## 2019-02-04 HISTORY — DX: Primary open-angle glaucoma, right eye, moderate stage: H40.1112

## 2019-02-04 HISTORY — DX: Primary open-angle glaucoma, left eye, mild stage: H40.1121

## 2019-02-05 DIAGNOSIS — H401121 Primary open-angle glaucoma, left eye, mild stage: Secondary | ICD-10-CM | POA: Diagnosis not present

## 2019-02-05 DIAGNOSIS — H401112 Primary open-angle glaucoma, right eye, moderate stage: Secondary | ICD-10-CM | POA: Diagnosis not present

## 2019-02-15 DIAGNOSIS — I129 Hypertensive chronic kidney disease with stage 1 through stage 4 chronic kidney disease, or unspecified chronic kidney disease: Secondary | ICD-10-CM | POA: Diagnosis not present

## 2019-02-15 DIAGNOSIS — N189 Chronic kidney disease, unspecified: Secondary | ICD-10-CM | POA: Diagnosis not present

## 2019-02-15 DIAGNOSIS — Z1159 Encounter for screening for other viral diseases: Secondary | ICD-10-CM | POA: Diagnosis not present

## 2019-02-15 DIAGNOSIS — N2581 Secondary hyperparathyroidism of renal origin: Secondary | ICD-10-CM | POA: Diagnosis not present

## 2019-02-15 DIAGNOSIS — D631 Anemia in chronic kidney disease: Secondary | ICD-10-CM | POA: Diagnosis not present

## 2019-02-15 DIAGNOSIS — N183 Chronic kidney disease, stage 3 (moderate): Secondary | ICD-10-CM | POA: Diagnosis not present

## 2019-02-19 ENCOUNTER — Other Ambulatory Visit: Payer: Self-pay | Admitting: Nephrology

## 2019-02-19 DIAGNOSIS — N183 Chronic kidney disease, stage 3 unspecified: Secondary | ICD-10-CM

## 2019-02-20 ENCOUNTER — Ambulatory Visit
Admission: RE | Admit: 2019-02-20 | Discharge: 2019-02-20 | Disposition: A | Discharge status: Home / Self Care | Payer: Medicare HMO | Source: Ambulatory Visit | Attending: Nephrology | Admitting: Nephrology

## 2019-02-20 DIAGNOSIS — N183 Chronic kidney disease, stage 3 unspecified: Secondary | ICD-10-CM

## 2019-02-20 DIAGNOSIS — N281 Cyst of kidney, acquired: Secondary | ICD-10-CM | POA: Diagnosis not present

## 2019-03-12 DIAGNOSIS — H401112 Primary open-angle glaucoma, right eye, moderate stage: Secondary | ICD-10-CM | POA: Diagnosis not present

## 2019-03-12 DIAGNOSIS — H401121 Primary open-angle glaucoma, left eye, mild stage: Secondary | ICD-10-CM | POA: Diagnosis not present

## 2019-03-15 DIAGNOSIS — C61 Malignant neoplasm of prostate: Secondary | ICD-10-CM | POA: Diagnosis not present

## 2019-03-20 ENCOUNTER — Emergency Department (HOSPITAL_BASED_OUTPATIENT_CLINIC_OR_DEPARTMENT_OTHER): Payer: Medicare HMO

## 2019-03-20 ENCOUNTER — Emergency Department (HOSPITAL_BASED_OUTPATIENT_CLINIC_OR_DEPARTMENT_OTHER)
Admission: EM | Admit: 2019-03-20 | Discharge: 2019-03-20 | Disposition: A | Discharge status: Home / Self Care | Payer: Medicare HMO | Attending: Emergency Medicine | Admitting: Emergency Medicine

## 2019-03-20 ENCOUNTER — Encounter (HOSPITAL_BASED_OUTPATIENT_CLINIC_OR_DEPARTMENT_OTHER): Payer: Self-pay

## 2019-03-20 ENCOUNTER — Other Ambulatory Visit: Payer: Self-pay

## 2019-03-20 DIAGNOSIS — Z7982 Long term (current) use of aspirin: Secondary | ICD-10-CM | POA: Diagnosis not present

## 2019-03-20 DIAGNOSIS — S52502A Unspecified fracture of the lower end of left radius, initial encounter for closed fracture: Secondary | ICD-10-CM

## 2019-03-20 DIAGNOSIS — R1012 Left upper quadrant pain: Secondary | ICD-10-CM | POA: Insufficient documentation

## 2019-03-20 DIAGNOSIS — S52615A Nondisplaced fracture of left ulna styloid process, initial encounter for closed fracture: Secondary | ICD-10-CM | POA: Diagnosis not present

## 2019-03-20 DIAGNOSIS — I2510 Atherosclerotic heart disease of native coronary artery without angina pectoris: Secondary | ICD-10-CM | POA: Diagnosis not present

## 2019-03-20 DIAGNOSIS — Y93K1 Activity, walking an animal: Secondary | ICD-10-CM | POA: Diagnosis not present

## 2019-03-20 DIAGNOSIS — S3993XA Unspecified injury of pelvis, initial encounter: Secondary | ICD-10-CM | POA: Diagnosis not present

## 2019-03-20 DIAGNOSIS — S59912A Unspecified injury of left forearm, initial encounter: Secondary | ICD-10-CM | POA: Diagnosis present

## 2019-03-20 DIAGNOSIS — Y999 Unspecified external cause status: Secondary | ICD-10-CM | POA: Insufficient documentation

## 2019-03-20 DIAGNOSIS — Z79899 Other long term (current) drug therapy: Secondary | ICD-10-CM | POA: Diagnosis not present

## 2019-03-20 DIAGNOSIS — Z7984 Long term (current) use of oral hypoglycemic drugs: Secondary | ICD-10-CM | POA: Insufficient documentation

## 2019-03-20 DIAGNOSIS — R109 Unspecified abdominal pain: Secondary | ICD-10-CM | POA: Diagnosis not present

## 2019-03-20 DIAGNOSIS — S52592A Other fractures of lower end of left radius, initial encounter for closed fracture: Secondary | ICD-10-CM | POA: Diagnosis not present

## 2019-03-20 DIAGNOSIS — S2243XA Multiple fractures of ribs, bilateral, initial encounter for closed fracture: Secondary | ICD-10-CM | POA: Diagnosis not present

## 2019-03-20 DIAGNOSIS — I10 Essential (primary) hypertension: Secondary | ICD-10-CM | POA: Diagnosis not present

## 2019-03-20 DIAGNOSIS — W010XXA Fall on same level from slipping, tripping and stumbling without subsequent striking against object, initial encounter: Secondary | ICD-10-CM | POA: Insufficient documentation

## 2019-03-20 DIAGNOSIS — Z87891 Personal history of nicotine dependence: Secondary | ICD-10-CM | POA: Diagnosis not present

## 2019-03-20 DIAGNOSIS — Y929 Unspecified place or not applicable: Secondary | ICD-10-CM | POA: Diagnosis not present

## 2019-03-20 DIAGNOSIS — E119 Type 2 diabetes mellitus without complications: Secondary | ICD-10-CM | POA: Insufficient documentation

## 2019-03-20 DIAGNOSIS — S2242XA Multiple fractures of ribs, left side, initial encounter for closed fracture: Secondary | ICD-10-CM | POA: Insufficient documentation

## 2019-03-20 DIAGNOSIS — S3991XA Unspecified injury of abdomen, initial encounter: Secondary | ICD-10-CM | POA: Diagnosis not present

## 2019-03-20 LAB — CBC WITH DIFFERENTIAL/PLATELET
Abs Immature Granulocytes: 0.03 10*3/uL (ref 0.00–0.07)
Basophils Absolute: 0.1 10*3/uL (ref 0.0–0.1)
Basophils Relative: 1 %
Eosinophils Absolute: 0.2 10*3/uL (ref 0.0–0.5)
Eosinophils Relative: 2 %
HCT: 38.2 % — ABNORMAL LOW (ref 39.0–52.0)
Hemoglobin: 12.4 g/dL — ABNORMAL LOW (ref 13.0–17.0)
Immature Granulocytes: 0 %
Lymphocytes Relative: 18 %
Lymphs Abs: 2.1 10*3/uL (ref 0.7–4.0)
MCH: 30.2 pg (ref 26.0–34.0)
MCHC: 32.5 g/dL (ref 30.0–36.0)
MCV: 92.9 fL (ref 80.0–100.0)
Monocytes Absolute: 1 10*3/uL (ref 0.1–1.0)
Monocytes Relative: 9 %
Neutro Abs: 8.2 10*3/uL — ABNORMAL HIGH (ref 1.7–7.7)
Neutrophils Relative %: 70 %
Platelets: 239 10*3/uL (ref 150–400)
RBC: 4.11 MIL/uL — ABNORMAL LOW (ref 4.22–5.81)
RDW: 14 % (ref 11.5–15.5)
WBC: 11.6 10*3/uL — ABNORMAL HIGH (ref 4.0–10.5)
nRBC: 0 % (ref 0.0–0.2)

## 2019-03-20 LAB — COMPREHENSIVE METABOLIC PANEL
ALT: 16 U/L (ref 0–44)
AST: 17 U/L (ref 15–41)
Albumin: 3.6 g/dL (ref 3.5–5.0)
Alkaline Phosphatase: 76 U/L (ref 38–126)
Anion gap: 10 (ref 5–15)
BUN: 30 mg/dL — ABNORMAL HIGH (ref 8–23)
CO2: 25 mmol/L (ref 22–32)
Calcium: 9.1 mg/dL (ref 8.9–10.3)
Chloride: 103 mmol/L (ref 98–111)
Creatinine, Ser: 1.45 mg/dL — ABNORMAL HIGH (ref 0.61–1.24)
GFR calc Af Amer: 51 mL/min — ABNORMAL LOW (ref >60)
GFR calc non Af Amer: 44 mL/min — ABNORMAL LOW (ref >60)
Glucose, Bld: 338 mg/dL — ABNORMAL HIGH (ref 70–99)
Potassium: 3.6 mmol/L (ref 3.5–5.1)
Sodium: 138 mmol/L (ref 135–145)
Total Bilirubin: 0.9 mg/dL (ref 0.3–1.2)
Total Protein: 6.9 g/dL (ref 6.5–8.1)

## 2019-03-20 MED ORDER — HYDROCODONE-ACETAMINOPHEN 5-325 MG PO TABS: 1.0000 | ORAL_TABLET | Freq: Four times a day (QID) | ORAL | 0 refills | Status: DC | PRN

## 2019-03-20 MED ORDER — IOHEXOL 350 MG/ML SOLN
100.0000 mL | Freq: Once | INTRAVENOUS | Status: AC | PRN
  Administered 2019-03-20: 11:00:00 80 mL via INTRAVENOUS

## 2019-03-20 MED ORDER — ONDANSETRON HCL 4 MG/2ML IJ SOLN
4.0000 mg | Freq: Once | INTRAMUSCULAR | Status: AC
  Administered 2019-03-20: 4 mg via INTRAVENOUS
  Filled 2019-03-20: qty 2

## 2019-03-20 MED ORDER — FENTANYL CITRATE (PF) 100 MCG/2ML IJ SOLN
25.0000 ug | Freq: Once | INTRAMUSCULAR | Status: AC
  Administered 2019-03-20: 10:00:00 25 ug via INTRAVENOUS
  Filled 2019-03-20: qty 2

## 2019-03-20 MED ORDER — DOCUSATE SODIUM 100 MG PO CAPS: 100.0000 mg | ORAL_CAPSULE | Freq: Two times a day (BID) | ORAL | 0 refills | Status: DC

## 2019-03-20 MED FILL — HYDROCODON-APAP 5-325: 5-325 | 3 days supply | Qty: 15 | Fill #0

## 2019-03-20 MED FILL — DOK 100 MG SOFTGEL: 100 | 50 days supply | Qty: 100 | Fill #0

## 2019-03-20 NOTE — ED Notes (Signed)
Patient transported to CT 

## 2019-03-20 NOTE — ED Provider Notes (Signed)
[facility]ER[facility] DEPARTMENT Provider Note   CSN: [id number] Arrival date & time: 03/20/19  2376     History   Chief Complaint Chief Complaint  Patient presents with   Fall   Chest Pain    HPI [name] is a 85 y.o. male.     Patient is an 85 year old male with a history of CAD, diabetes, hypertension and hyperlipidemia who is presenting today with severe worsening of left-sided back and chest pain.  Patient states 6 days ago he was walking his dog when a squirrel ran out and the dog ran and started chasing the squirrel and pulled him onto the cement.  He states that he scraped his leg, bruised his left arm badly and had some mild tenderness in his left chest.  He overall felt like he had been doing pretty well but woke up in the middle of the night with severe left-sided back pain that has persisted to this morning and has continued to feel worse.  He feels slightly short of breath because it so painful to take a deep breath and is also having some left upper quadrant pain.  He denies any loss of consciousness during this fall and has not had any neck pain.  He has been able to walk without difficulty and has no pain in his legs just the healing scrapes.  He takes aspirin but no other anticoagulation.  He describes the pain is sharp stabbing and 9 out of 10  The history is provided by the patient.  Fall This is a new problem. Episode onset: 6 days ago. The problem occurs constantly. The problem has been rapidly worsening. Associated symptoms include chest pain and abdominal pain. The symptoms are aggravated by bending and twisting (deep breathing). Nothing relieves the symptoms. He has tried nothing for the symptoms. The treatment provided no relief.  Chest Pain Associated symptoms: abdominal pain     Past Medical History:  Diagnosis Date   CAD (coronary artery disease)    LHC 03/25/11 by Dr. Burt Knack:  pLAD 99%, oCFX 20-30%, pOM1 40%, dAVCFX 20-30%.  EF was normal  on nuclear study.  He was treated with a Promus DES to his pLAD.    Colon polyps 1996   villous adenoma   DM type 2 (diabetes mellitus, type 2) ([place]) 2002   Fuchs' corneal dystrophy    Hyperlipidemia    Hypertension    Macular degeneration    posterior vitreous vitreous detachment   Prostate cancer ([place]) dx 2018    Patient Active Problem List   Diagnosis Date Noted   Preventative health care 04/01/2017   Prostate cancer ([place]) 02/15/2017   BPH with urinary obstruction 12/13/2012   CAD (coronary artery disease) 04/09/2011   GASTRITIS 05/25/2010   COLONIC POLYPS, ADENOMATOUS, HX OF 04/20/2010   ANEMIA 10/27/2009   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, without long-term current use of insulin ([place]) 07/04/2006   Hyperlipidemia 07/04/2006   Essential hypertension 07/04/2006    Past Surgical History:  Procedure Laterality Date   CARDIAC CATHETERIZATION  03/25/2011   cataract Bilateral 2010   corenea implants  june and sept 2018   [name]   CORONARY STENT PLACEMENT  03/25/2011   RADIOACTIVE SEED IMPLANT N/A 05/06/2017   Procedure: RADIOACTIVE SEED IMPLANT/BRACHYTHERAPY IMPLANT;  Surgeon: Kathie Rhodes, MD;  Location: [facility];  Service: Urology;  Laterality: N/A;   SPACE OAR INSTILLATION N/A 05/06/2017   Procedure: SPACE OAR INSTILLATION;  Surgeon: Kathie Rhodes, MD;  Location: [facility];  Service: Urology;  Laterality: N/A;        Home Medications    Prior to Admission medications   Medication Sig Start Date End Date Taking? Authorizing Provider  Alcohol Swabs PADS Use as directed once a day 11/15/18   Carollee Herter, Alferd Apa, DO  amLODipine (NORVASC) 5 MG tablet Take 1 tablet (5 mg total) by mouth daily. 11/15/18   [name], DO  aspirin 81 MG tablet Take 81 mg by mouth daily.    [provider]  atorvastatin (LIPITOR) 10 MG tablet TAKE 1 TABLET EVERY DAY 10/09/18    Carollee Herter, Alferd Apa, DO  Blood Glucose Calibration (TRUE METRIX LEVEL 1) Low SOLN Use as directed. 11/15/18   [name], DO  Blood Glucose Monitoring Suppl (TRUE METRIX AIR GLUCOSE METER) w/Device KIT 1 each by Does not apply route daily. Use as directed once a day 11/15/18   Carollee Herter, Alferd Apa, DO  brimonidine-timolol (COMBIGAN) 0.2-0.5 % ophthalmic solution Place 1 drop into the right eye 2 (two) times daily.     [provider]  fish oil-omega-3 fatty acids 1000 MG capsule Take 2 g by mouth daily.      [provider]  glimepiride (AMARYL) 4 MG tablet TAKE 1 TABLET EVERY DAY WITH BREAKFAST 12/27/18   Carollee Herter, [name], DO  glucose blood (TRUE METRIX BLOOD GLUCOSE TEST) test strip Use as directed once a day.  Dx code: E11.9 11/15/18   Carollee Herter, Alferd Apa, DO  losartan-hydrochlorothiazide (HYZAAR) 100-25 MG tablet TAKE 1 TABLET EVERY DAY 01/02/19   [name], DO  Multiple Vitamin (MULTIVITAMIN) tablet Take 1 tablet by mouth daily.    [provider]  prednisoLONE acetate (PRED FORTE) 1 % ophthalmic suspension Place 1 drop into the left eye 4 (four) times daily.     [provider]  SitaGLIPtin-MetFORMIN HCl (JANUMET XR) [phone] MG TB24 Take 1 tablet by mouth daily. 03/30/16   [name], DO  tamsulosin (FLOMAX) 0.4 MG CAPS capsule TAKE 1 CAPSULE (0.4 MG TOTAL) BY MOUTH DAILY. 05/04/16   Roma Schanz R, DO  TRUEplus Lancets 30G MISC Use as directed once a day.  E11.9 11/15/18   [name], DO    Family History Family History  Problem Relation Age of Onset   Coronary artery disease Father 68       deceased   Stomach cancer Mother 55    Social History Social History   Tobacco Use   Smoking status: Former Smoker    Packs/day: 0.25    Years: 3.00    Pack years: 0.75    Quit date: 06/07/1958    Years since quitting: 60.8   Smokeless tobacco: Never Used  Substance Use Topics   Alcohol use:  Yes    Comment: 1 glass wine per day   Drug use: No     Allergies   Patient has no known allergies.   Review of Systems Review of Systems  Cardiovascular: Positive for chest pain.  Gastrointestinal: Positive for abdominal pain.  All other systems reviewed and are negative.    Physical Exam Updated Vital Signs BP (!) 187/75 (BP Location: Right Arm)    Pulse (!) 58    Temp 98.1 F (36.7 C) (Oral)    Resp 16    Ht 5' 3"  (1.6 m)    Wt 62.6 kg  SpO2 100%    BMI 24.45 kg/m   Physical Exam Vitals signs and nursing note reviewed.  Constitutional:      General: He is in acute distress.     Appearance: He is well-developed and normal weight.     Comments: Appears uncomfortable  HENT:     Head: Normocephalic and atraumatic.  Eyes:     Conjunctiva/sclera: Conjunctivae normal.     Pupils: Pupils are equal, round, and reactive to light.  Neck:     Musculoskeletal: Normal range of motion and neck supple.  Cardiovascular:     Rate and Rhythm: Regular rhythm. Bradycardia present.     Pulses: Normal pulses.     Heart sounds: No murmur.  Pulmonary:     Effort: Pulmonary effort is normal. No respiratory distress.     Breath sounds: Normal breath sounds. No wheezing or rales.  Chest:     Chest wall: Tenderness present. No crepitus.    Abdominal:     General: There is no distension.     Palpations: Abdomen is soft.     Tenderness: There is no abdominal tenderness. There is no guarding or rebound.  Musculoskeletal: Normal range of motion.     Left wrist: He exhibits tenderness and bony tenderness. He exhibits no deformity.       Back:       Arms:       Legs:  Skin:    General: Skin is warm and dry.     Capillary Refill: Capillary refill takes less than 2 seconds.     Findings: Bruising present. No erythema or rash.  Neurological:     General: No focal deficit present.     Mental Status: He is alert and oriented to person, place, and time. Mental status is at baseline.    Psychiatric:        Mood and Affect: Mood normal.        Behavior: Behavior normal.        Thought Content: Thought content normal.      ED Treatments / Results  Labs (all labs ordered are listed, but only abnormal results are displayed) Labs Reviewed  CBC WITH DIFFERENTIAL/PLATELET - Abnormal; Notable for the following components:      Result Value   WBC 11.6 (*)    RBC 4.11 (*)    Hemoglobin 12.4 (*)    HCT 38.2 (*)    Neutro Abs 8.2 (*)    All other components within normal limits  COMPREHENSIVE METABOLIC PANEL - Abnormal; Notable for the following components:   Glucose, Bld 338 (*)    BUN 30 (*)    Creatinine, Ser 1.45 (*)    GFR calc non Af Amer 44 (*)    GFR calc Af Amer 51 (*)    All other components within normal limits    EKG None  Radiology Dg Wrist Complete Left  Result Date: 03/20/2019 CLINICAL DATA:  Fall 01/12/2019 with lateral wrist pain EXAM: LEFT WRIST - COMPLETE 3+ VIEW COMPARISON:  None. FINDINGS: Nondisplaced fracture through the distal radial metaphysis. Tiny avulsion fracture at the ulnar styloid. Osteopenia. Located wrist joint. IMPRESSION: 1. Nondisplaced distal radius fracture. 2. Ulnar styloid avulsion fracture. Electronically Signed   By: Monte Fantasia M.D.   On: 03/20/2019 11:30   Ct Angio Chest Pe W And/or Wo Contrast  Result Date: 03/20/2019 CLINICAL DATA:  Fall on October 7th, landed on left side, left-sided rib pain EXAM: CT ANGIOGRAPHY CHEST WITH CONTRAST TECHNIQUE: Multidetector  CT imaging of the chest was performed using the standard protocol during bolus administration of intravenous contrast. Multiplanar CT image reconstructions and MIPs were obtained to evaluate the vascular anatomy. CONTRAST:  18m OMNIPAQUE IOHEXOL 350 MG/ML SOLN COMPARISON:  None. FINDINGS: Cardiovascular: Satisfactory opacification of the pulmonary arteries to the segmental level. No evidence of pulmonary embolism. Normal heart size. Left coronary artery  calcifications and/or stents. No pericardial effusion. Aortic atherosclerosis. Mediastinum/Nodes: No enlarged mediastinal, hilar, or axillary lymph nodes. Thyroid gland, trachea, and esophagus demonstrate no significant findings. Lungs/Pleura: Lungs are clear. Trace left pleural effusion. Mild bibasilar atelectasis or scarring. Upper Abdomen: No acute abnormality. Musculoskeletal: Osteopenia. There is a minimally displaced acute fracture of the posterior right eighth rib (series 5, image 48). There is a nondisplaced fracture of the posterior left tenth rib (series 5, image 71). There are nondisplaced, subtle irregularities of the anterolateral left fourth through sixth ribs, possibly nondisplaced fractures (series 5, image 46, 52, 57). Review of the MIP images confirms the above findings. IMPRESSION: 1. Negative examination for pulmonary embolism. 2. There is a minimally displaced acute fracture of the posterior right eighth rib (series 5, image 48). There is a nondisplaced fracture of the posterior left tenth rib (series 5, image 71). There are nondisplaced, subtle irregularities of the anterolateral left fourth through sixth ribs, possibly nondisplaced fractures (series 5, image 46, 52, 57). 3. Trace left pleural effusion. 4. Coronary artery disease.  Aortic Atherosclerosis (ICD10-I70.0). Electronically Signed   By: AEddie CandleM.D.   On: 03/20/2019 11:33   Ct Abdomen Pelvis W Contrast  Result Date: 03/20/2019 CLINICAL DATA:  Fall, landed on left side, pain EXAM: CT ABDOMEN AND PELVIS WITH CONTRAST TECHNIQUE: Multidetector CT imaging of the abdomen and pelvis was performed using the standard protocol following bolus administration of intravenous contrast. CONTRAST:  888mOMNIPAQUE IOHEXOL 350 MG/ML SOLN COMPARISON:  None. FINDINGS: Lower chest: Please see separately dictated CT examination of the chest. Hepatobiliary: No solid liver abnormality is seen. No gallstones, gallbladder wall thickening, or biliary  dilatation. Pancreas: Parenchymal atrophy. No pancreatic ductal dilatation or surrounding inflammatory changes. Spleen: Normal in size without significant abnormality. Adrenals/Urinary Tract: Adrenal glands are unremarkable. Kidneys are normal, without renal calculi, solid lesion, or hydronephrosis. Bladder is unremarkable. Stomach/Bowel: Stomach is within normal limits. Appendix appears normal. No evidence of bowel wall thickening, distention, or inflammatory changes. Large burden of stool and stool balls in the colon. Vascular/Lymphatic: Aortic atherosclerosis. No enlarged abdominal or pelvic lymph nodes. Reproductive: Prostatomegaly. Brachytherapy pellets in the prostate. Other: No abdominal wall hernia or abnormality. No abdominopelvic ascites. Musculoskeletal: No acute or significant osseous findings. IMPRESSION: 1.  No evidence of acute traumatic injury in the abdomen or pelvis. 2.  Prostatomegaly status post brachytherapy. 3.  Aortic Atherosclerosis (ICD10-I70.0). Electronically Signed   By: AlEddie Candle.D.   On: 03/20/2019 11:44    Procedures Procedures (including critical care time)  Medications Ordered in ED Medications  fentaNYL (SUBLIMAZE) injection 25 mcg (has no administration in time range)  ondansetron (ZOFRAN) injection 4 mg (4 mg Intravenous Given 03/20/19 0938)     Initial Impression / Assessment and Plan / ED Course  I have reviewed the triage vital signs and the nursing notes.  Pertinent labs & imaging results that were available during my care of the patient were reviewed by me and considered in my medical decision making (see chart for details).        Patient presenting today with worsening left-sided chest and back pain  that started throughout the night after history of fall 6 days ago when his dog dragged him to the ground.  Patient states he had only been having minimal pain in the left chest until last night.  He appears uncomfortable but is able to speak in full  sentences and oxygen saturation is 100% on room air.  He does have some mild fullness and tenderness in the left upper quadrant in addition to point tenderness in the posterior lower ribs on the left.  Breath sounds are equal bilaterally and low suspicion for pneumothorax.  Patient does take aspirin but no other anticoagulation.  No altered mental status or loss of consciousness.  Concern for fracture versus abdominal hematoma versus PE from recent injury.  Low suspicion for ACS at this time.  Patient given pain control and CT of the chest and abdomen pending as well as x-rays of the left wrist as he has had ongoing tenderness and has snuffbox tenderness.  12:20 PM  Labs without acute findings.  Chest CT is negative for PE but there are minimally displaced fractures of the posterior right eighth rib as well as the left 10th rib.  Patient also has nondisplaced irregularities of the left fourth through sixth ribs which are most likely fractures as well.  Abdominal imaging is negative for acute pathology and the left wrist shows a nondisplaced distal radius fracture and ulnar styloid avulsion fracture.  Patient was placed in a short arm splint and given follow-up with sports medicine.  He was given pain control and stool softener.  He was given return precautions.  He is well-appearing at this time and pain is controlled after 1 dose of fentanyl.  He was able to ambulate to the bathroom without difficulty. Final Clinical Impressions(s) / ED Diagnoses   Final diagnoses:  Closed fracture of multiple ribs of left side, initial encounter  Closed fracture of distal end of left radius, unspecified fracture morphology, initial encounter    ED Discharge Orders         Ordered    HYDROcodone-acetaminophen (NORCO/VICODIN) 5-325 MG tablet  Every 6 hours PRN     03/20/19 1219    docusate sodium (COLACE) 100 MG capsule  Every 12 hours     03/20/19 1219           Blanchie Dessert, MD 03/20/19 1222

## 2019-03-20 NOTE — Discharge Instructions (Addendum)
Sure your pain is staying controlled enough that you can take deep breaths.  If you start having severe shortness of breath, fever or uncontrollable pain please return to the emergency room.  Do not do any lifting with your left hand follow-up with the sports medicine doctor later this week for more permanent cast.

## 2019-03-20 NOTE — ED Notes (Signed)
Assisted to bathroom with steady gait.

## 2019-03-20 NOTE — ED Triage Notes (Signed)
Pt states fell last week October 7th, has been improving slightly, continues to report left posterior rib pain.  Ambulatory on arrival with steady gait.

## 2019-03-21 ENCOUNTER — Ambulatory Visit: Payer: Medicare HMO | Admitting: Family Medicine

## 2019-03-21 ENCOUNTER — Other Ambulatory Visit: Payer: Self-pay

## 2019-03-21 ENCOUNTER — Encounter: Payer: Self-pay | Admitting: Family Medicine

## 2019-03-21 VITALS — BP 167/72 | HR 64 | Ht 64.0 in | Wt 128.0 lb

## 2019-03-21 DIAGNOSIS — S2242XA Multiple fractures of ribs, left side, initial encounter for closed fracture: Secondary | ICD-10-CM | POA: Insufficient documentation

## 2019-03-21 DIAGNOSIS — S52592A Other fractures of lower end of left radius, initial encounter for closed fracture: Secondary | ICD-10-CM

## 2019-03-21 DIAGNOSIS — M810 Age-related osteoporosis without current pathological fracture: Secondary | ICD-10-CM

## 2019-03-21 DIAGNOSIS — M80039A Age-related osteoporosis with current pathological fracture, unspecified forearm, initial encounter for fracture: Secondary | ICD-10-CM | POA: Insufficient documentation

## 2019-03-21 HISTORY — DX: Multiple fractures of ribs, left side, initial encounter for closed fracture: S22.42XA

## 2019-03-21 HISTORY — DX: Age-related osteoporosis without current pathological fracture: M81.0

## 2019-03-21 NOTE — Assessment & Plan Note (Addendum)
Placed in volar splint.  X-ray was demonstrating an ulnar styloid fracture as well.  Shows osteopenia on imaging. -Continue splint.  We will follow-up in 1 week. -Counseled on supportive care. -Can transition to a Velcro splint at follow-up. -Could consider Prolia or Evenity.

## 2019-03-21 NOTE — Assessment & Plan Note (Signed)
Was provided a spirometer by the emergency department.  No trouble breathing or fever today. -Provided rib belt. -Counseled on the spirometer use. -Counseled on supportive care.

## 2019-03-21 NOTE — Progress Notes (Signed)
Donald HARTSEL - 85 y.o. male MRN [id number]  Date of birth: Nov 20, 1931  SUBJECTIVE:  Including CC & ROS.  Chief Complaint  Patient presents with  . Wrist Injury    left wrist/left sided ribs x 03/14/2019    [name] is a 85 y.o. male that is presenting with left wrist fracture and multiple rib fractures.  He was seen in the emergency department on 10/13.  He was running with his dog about a week ago and was pulled to the ground.  He has multiple abrasions over the left knee right hand and left hand.  Also has abrasions in the left arm.  He has had some bruising in the left lower flank.  Was provided a splint for the left forearm and wrist.  Has been taking pain medications with some improvement of his pain.  He was provided a spirometer to help with his multiple rib fractures.  Pain is mild.  The rib pain seems to be worse with movements or Valsalva maneuvers..  Independent review of the left wrist x-ray from 10/13 shows a nondisplaced distal radius fracture and an ulnar styloid avulsion fracture.   Review of Systems  Constitutional: Negative for fever.  HENT: Negative for congestion.   Respiratory: Negative for cough.   Cardiovascular: Negative for chest pain.  Gastrointestinal: Negative for abdominal pain.  Musculoskeletal: Positive for arthralgias.  Skin: Positive for color change.  Neurological: Negative for weakness.  Hematological: Negative for adenopathy.    HISTORY: Past Medical, Surgical, Social, and Family History Reviewed & Updated per EMR.   Pertinent Historical Findings include:  Past Medical History:  Diagnosis Date  . CAD (coronary artery disease)    LHC 03/25/11 by Dr. Burt Knack:  pLAD 99%, oCFX 20-30%, pOM1 40%, dAVCFX 20-30%.  EF was normal on nuclear study.  He was treated with a Promus DES to his pLAD.   [name] Colon polyps 1996   villous adenoma  . DM type 2 (diabetes mellitus, type 2) ([place]) 2002  . Fuchs' corneal dystrophy   . Hyperlipidemia   . Hypertension   .  Macular degeneration    posterior vitreous vitreous detachment  . Prostate cancer [facility]) dx 2018    Past Surgical History:  Procedure Laterality Date  . CARDIAC CATHETERIZATION  03/25/2011  . cataract Bilateral 2010  . corenea implants  june and sept 2018   [name]  . CORONARY STENT PLACEMENT  03/25/2011  . RADIOACTIVE SEED IMPLANT N/A 05/06/2017   Procedure: RADIOACTIVE SEED IMPLANT/BRACHYTHERAPY IMPLANT;  Surgeon: Kathie Rhodes, MD;  Location: [facility];  Service: Urology;  Laterality: N/A;  . SPACE OAR INSTILLATION N/A 05/06/2017   Procedure: SPACE OAR INSTILLATION;  Surgeon: Kathie Rhodes, MD;  Location: [facility];  Service: Urology;  Laterality: N/A;    No Known Allergies  Family History  Problem Relation Age of Onset  . Coronary artery disease Father 88       deceased  . Stomach cancer Mother 76     Social History   Socioeconomic History  . Marital status: Widowed    Spouse name: Not on file  . Number of children: Not on file  . Years of education: Not on file  . Highest education level: Not on file  Occupational History  . Not on file  Social Needs  . Financial resource strain: Not on file  . Food insecurity    Worry: Not on file    Inability: Not on file  .  Transportation needs    Medical: Not on file    Non-medical: Not on file  Tobacco Use  . Smoking status: Former Smoker    Packs/day: 0.25    Years: 3.00    Pack years: 0.75    Quit date: 06/07/1958    Years since quitting: 60.8  . Smokeless tobacco: Never Used  Substance and Sexual Activity  . Alcohol use: Yes    Comment: 1 glass wine per day  . Drug use: No  . Sexual activity: Not Currently  Lifestyle  . Physical activity    Days per week: Not on file    Minutes per session: Not on file  . Stress: Not on file  Relationships  . Social Herbalist on phone: Not on file    Gets together: Not on file    Attends religious service: Not on file     Active member of club or organization: Not on file    Attends meetings of clubs or organizations: Not on file    Relationship status: Not on file  . Intimate partner violence    Fear of current or ex partner: Not on file    Emotionally abused: Not on file    Physically abused: Not on file    Forced sexual activity: Not on file  Other Topics Concern  . Not on file  Social History Narrative  . Not on file     PHYSICAL EXAM:  VS: BP (!) 167/72   Pulse 64   Ht 5\' 4"  (1.626 m)   Wt 128 lb (58.1 kg)   BMI 21.97 kg/m  Physical Exam Gen: NAD, alert, cooperative with exam, well-appearing ENT: normal lips, normal nasal mucosa,  Eye: normal EOM, normal conjunctiva and lids CV:  no edema, +2 pedal pulses   Resp: no accessory muscle use, non-labored,  Skin: no rashes, no areas of induration  Neuro: normal tone, normal sensation to touch Psych:  normal insight, alert and oriented MSK:  Ribs: No step offs or crepitus. Ecchymosis in the left lower flank. Tenderness palpation over this area. Left arm: Volar splint was placed on the left arm. Normal finger range of motion. Normal sensation to touch. Neurovascularly intact     ASSESSMENT & PLAN:   Multiple closed fractures of ribs of left side Was provided a spirometer by the emergency department.  No trouble breathing or fever today. -Provided rib belt. -Counseled on the spirometer use. -Counseled on supportive care.   Closed fracture of left distal radius Placed in volar splint.  X-ray was demonstrating an ulnar styloid fracture as well.  Shows osteopenia on imaging. -Continue splint.  We will follow-up in 1 week. -Counseled on supportive care. -Can transition to a Velcro splint at follow-up. -Could consider Prolia or Evenity.

## 2019-03-21 NOTE — Patient Instructions (Signed)
Nice to meet you Please try vitamin k2  Please try the rib belt during the day. Continue the spirometer  Please try tylenol and ice   Please send me a message in MyChart with any questions or updates.  Please see me back in 1 week.   --Dr. Raeford Razor

## 2019-03-23 ENCOUNTER — Other Ambulatory Visit: Payer: Self-pay | Admitting: Family Medicine

## 2019-03-23 DIAGNOSIS — E785 Hyperlipidemia, unspecified: Secondary | ICD-10-CM

## 2019-03-26 ENCOUNTER — Telehealth: Payer: Self-pay | Admitting: Hematology and Oncology

## 2019-03-26 NOTE — Telephone Encounter (Signed)
Received a new patient referral from Dr. Hollie Salk for eval for amyloidosis/light chain disease. SPEP neg, predominant lambda light chains. Mr. Steighner has been cld and scheduled to see Dr. Lorenso Courier on 10/23 at 10am. Pt aware to arrive 15 minutes early.

## 2019-03-28 ENCOUNTER — Telehealth: Payer: Self-pay | Admitting: Family Medicine

## 2019-03-28 ENCOUNTER — Encounter: Payer: Self-pay | Admitting: Family Medicine

## 2019-03-28 ENCOUNTER — Other Ambulatory Visit: Payer: Self-pay

## 2019-03-28 ENCOUNTER — Ambulatory Visit: Payer: Medicare HMO | Admitting: Family Medicine

## 2019-03-28 ENCOUNTER — Ambulatory Visit (HOSPITAL_BASED_OUTPATIENT_CLINIC_OR_DEPARTMENT_OTHER)
Admission: RE | Admit: 2019-03-28 | Discharge: 2019-03-28 | Disposition: A | Discharge status: Home / Self Care | Payer: Medicare HMO | Source: Ambulatory Visit | Attending: Family Medicine | Admitting: Family Medicine

## 2019-03-28 VITALS — BP 180/83 | HR 68 | Ht 64.0 in | Wt 128.0 lb

## 2019-03-28 DIAGNOSIS — S52612A Displaced fracture of left ulna styloid process, initial encounter for closed fracture: Secondary | ICD-10-CM | POA: Diagnosis not present

## 2019-03-28 DIAGNOSIS — S2242XD Multiple fractures of ribs, left side, subsequent encounter for fracture with routine healing: Secondary | ICD-10-CM

## 2019-03-28 DIAGNOSIS — S52502A Unspecified fracture of the lower end of left radius, initial encounter for closed fracture: Secondary | ICD-10-CM | POA: Diagnosis not present

## 2019-03-28 DIAGNOSIS — M80032D Age-related osteoporosis with current pathological fracture, left forearm, subsequent encounter for fracture with routine healing: Secondary | ICD-10-CM

## 2019-03-28 NOTE — Assessment & Plan Note (Signed)
Improvement of the pain. No SOB. Has been wearing the rib belt - continue rib belt  - counseled on supportive care  -Continue spirometry.

## 2019-03-28 NOTE — Assessment & Plan Note (Signed)
Little to no pain today. Initial injury on 10/7.  - xray  - velcro splint  - counseled on HEP and supportive care - will try prolia

## 2019-03-28 NOTE — Telephone Encounter (Signed)
Spoke with patient xrays.   Rosemarie Ax, MD [facility] 03/28/2019, 4:33 PM

## 2019-03-28 NOTE — Progress Notes (Deleted)
[place] [phone]) [phone]   Fax:([phone]) [place] NOTE  Patient Care Team: Carollee Herter, Alferd Apa, DO as PCP - General (Family Medicine) Kathie Rhodes, MD as Consulting Physician (Urology) Tyler Pita, MD as Consulting Physician (Radiation Oncology) Marilynne Halsted, MD as Referring Physician (Ophthalmology)  Hematological/Oncological History # Elevated Lambda Light Chains A) 02/15/19: evaluated by nephrology for CKD. Found to have Free lambda chains 645.5 and Free kappa 27.9 with a ratio of 0.04. No M protein noted.  B) 03/30/19: establish care with Dr. 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 Tammi Klippel, continued to follow urology after that time.  C) 03/20/19: CT C/A/P following a fall. Noted prostatomegaly with no evidence of metastatic disease.  CHIEF COMPLAINTS/PURPOSE OF CONSULTATION:  "*** "  HISTORY OF PRESENTING ILLNESS:  [name] 85 y.o. 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 [name] was seen on 02/15/19 by Dr. Madelon Lips at [facility] at the request of the patient's PCP to evaluate his CKD. The patient has had mildly elevated Cr since at least 2017, but most recently on January 31, 2019 he was found to have a Cr 1.94. Subsequent Cr measurements have been elevated with a most recent GFR of 44. During his visit with nephrology SPEP and SLFC were ordered to further evaluate.   On review of outside records, labs on 02/15/19 reveal a normal SPEP (with no M spike), however Free lambda chains 645.5 and Free kappa 27.9 with a ratio of 0.04. Blood work at the time revealed WBC, Hgb, and Plt all WNL with a Ca 10.5 (nml 8.6-10.2) and Cr 1.39 (nml 0.76-1.27). Urine studies were  concerning for +2 protein on UA and  623 Urine prot/creat ratio.   On discussion today [name] notes ***  MEDICAL HISTORY:  Past Medical History:  Diagnosis Date   CAD (coronary artery disease)    LHC 03/25/11 by Dr. Burt Knack:  pLAD 99%, oCFX 20-30%, pOM1 40%, dAVCFX 20-30%.  EF was normal on nuclear study.  He was treated with a Promus DES to his pLAD.    Colon polyps 1996   villous adenoma   DM type 2 (diabetes mellitus, type 2) ([place]) 2002   Fuchs' corneal dystrophy    Hyperlipidemia    Hypertension    Macular degeneration    posterior vitreous vitreous detachment   Prostate cancer ([place]) dx 2018    SURGICAL HISTORY: Past Surgical History:  Procedure Laterality Date   CARDIAC CATHETERIZATION  03/25/2011   cataract Bilateral 2010   corenea implants  june and sept 2018   [name]   CORONARY STENT PLACEMENT  03/25/2011   RADIOACTIVE SEED IMPLANT N/A 05/06/2017   Procedure: RADIOACTIVE SEED IMPLANT/BRACHYTHERAPY IMPLANT;  Surgeon: Kathie Rhodes, MD;  Location: [facility];  Service: Urology;  Laterality: N/A;   SPACE OAR INSTILLATION N/A 05/06/2017   Procedure: SPACE OAR INSTILLATION;  Surgeon: Kathie Rhodes, MD;  Location: [facility];  Service: Urology;  Laterality: N/A;    SOCIAL HISTORY: Social History   Socioeconomic History   Marital status: Widowed    Spouse name: Not on file   Number of children: Not on file   Years of education: Not on file   Highest education level: Not on file  Occupational History   Not on file  Social Needs   Financial resource strain: Not on file   Food insecurity    Worry: Not on file    Inability: Not on file   Transportation needs    Medical: Not on file    Non-medical: Not on file  Tobacco Use   Smoking status: Former Smoker    Packs/day: 0.25    Years: 3.00    Pack years: 0.75    Quit date: 06/07/1958    Years since quitting: 60.8   Smokeless tobacco: Never Used    Substance and Sexual Activity   Alcohol use: Yes    Comment: 1 glass wine per day   Drug use: No   Sexual activity: Not Currently  Lifestyle   Physical activity    Days per week: Not on file    Minutes per session: Not on file   Stress: Not on file  Relationships   Social connections    Talks on phone: Not on file    Gets together: Not on file    Attends religious service: Not on file    Active member of club or organization: Not on file    Attends meetings of clubs or organizations: Not on file    Relationship status: Not on file   Intimate partner violence    Fear of current or ex partner: Not on file    Emotionally abused: Not on file    Physically abused: Not on file    Forced sexual activity: Not on file  Other Topics Concern   Not on file  Social History Narrative   Not on file    FAMILY HISTORY: Family History  Problem Relation Age of Onset   Coronary artery disease Father 34       deceased   Stomach cancer Mother 51    ALLERGIES:  has No Known Allergies.  MEDICATIONS:  Current Outpatient Medications  Medication Sig Dispense Refill   Alcohol Swabs PADS Use as directed once a day 100 each 1   amLODipine (NORVASC) 5 MG tablet Take 1 tablet (5 mg total) by mouth daily. 90 tablet 1   aspirin 81 MG tablet Take 81 mg by mouth daily.     atorvastatin (LIPITOR) 10 MG tablet TAKE 1 TABLET EVERY DAY 90 tablet 1   Blood Glucose Calibration (TRUE METRIX LEVEL 1) Low SOLN Use as directed. 1 each 0   Blood Glucose Monitoring Suppl (TRUE METRIX AIR GLUCOSE METER) w/Device KIT 1 each by Does not apply route daily. Use as directed once a day 1 kit 0   brimonidine-timolol (COMBIGAN) 0.2-0.5 % ophthalmic solution Place 1 drop into the right eye 2 (two) times daily.      docusate sodium (COLACE) 100 MG capsule Take 1 capsule (100 mg total) by mouth every 12 (twelve) hours. 60 capsule 0   fish oil-omega-3 fatty acids 1000 MG capsule Take 2 g by mouth daily.        glimepiride (AMARYL) 4 MG tablet TAKE 1 TABLET EVERY DAY WITH BREAKFAST 90 tablet 1   glucose blood (TRUE METRIX BLOOD GLUCOSE TEST) test strip Use as directed once a day.  Dx code: E11.9 100 each 12   HYDROcodone-acetaminophen (NORCO/VICODIN) 5-325 MG tablet Take 1 tablet by mouth every 6 (six) hours as needed for severe pain. 15 tablet 0   losartan-hydrochlorothiazide (HYZAAR) 100-25 MG tablet TAKE 1 TABLET EVERY DAY 90 tablet 1   Multiple Vitamin (MULTIVITAMIN) tablet Take 1 tablet  by mouth daily.     prednisoLONE acetate (PRED FORTE) 1 % ophthalmic suspension Place 1 drop into the left eye 4 (four) times daily.      SitaGLIPtin-MetFORMIN HCl (JANUMET XR) 843-769-0016 MG TB24 Take 1 tablet by mouth daily. 90 tablet 1   tamsulosin (FLOMAX) 0.4 MG CAPS capsule TAKE 1 CAPSULE (0.4 MG TOTAL) BY MOUTH DAILY. 90 capsule 3   TRUEplus Lancets 30G MISC Use as directed once a day.  E11.9 100 each 1   No current facility-administered medications for this visit.     REVIEW OF SYSTEMS:   Constitutional: ( - ) fevers, ( - )  chills , ( - ) night sweats Eyes: ( - ) blurriness of vision, ( - ) double vision, ( - ) watery eyes Ears, nose, mouth, throat, and face: ( - ) mucositis, ( - ) sore throat Respiratory: ( - ) cough, ( - ) dyspnea, ( - ) wheezes Cardiovascular: ( - ) palpitation, ( - ) chest discomfort, ( - ) lower extremity swelling Gastrointestinal:  ( - ) nausea, ( - ) heartburn, ( - ) change in bowel habits Skin: ( - ) abnormal skin rashes Lymphatics: ( - ) new lymphadenopathy, ( - ) easy bruising Neurological: ( - ) numbness, ( - ) tingling, ( - ) new weaknesses Behavioral/Psych: ( - ) mood change, ( - ) new changes  All other systems were reviewed with the patient and are negative.  PHYSICAL EXAMINATION: ECOG PERFORMANCE STATUS: {CHL ONC ECOG PS:[phone]}  There were no vitals filed for this visit. There were no vitals filed for this visit.  GENERAL: alert, no distress and  comfortable SKIN: skin color, texture, turgor are normal, no rashes or significant lesions EYES: conjunctiva are pink and non-injected, sclera clear OROPHARYNX: no exudate, no erythema; lips, buccal mucosa, and tongue normal  NECK: supple, non-tender LYMPH:  no palpable lymphadenopathy in the cervical, axillary or inguinal LUNGS: clear to auscultation and percussion with normal breathing effort HEART: regular rate & rhythm and no murmurs and no lower extremity edema ABDOMEN: soft, non-tender, non-distended, normal bowel sounds Musculoskeletal: no cyanosis of digits and no clubbing  PSYCH: alert & oriented x 3, fluent speech NEURO: no focal motor/sensory deficits  LABORATORY DATA:  I have reviewed the data as listed Lab Results  Component Value Date   WBC 11.6 (H) 03/20/2019   HGB 12.4 (L) 03/20/2019   HCT 38.2 (L) 03/20/2019   MCV 92.9 03/20/2019   PLT 239 03/20/2019   NEUTROABS 8.2 (H) 03/20/2019   Outside Record Labs: (02/15/19) normal SPEP (with no M spike), however Free lambda chains 645.5 and Free kappa 27.9 with a ratio of 0.04. Blood work at the time revealed WBC, Hgb, and Plt all WNL with a Ca 10.5 (nml 8.6-10.2) and Cr 1.39 (nml 0.76-1.27). Urine studies were concerning for +2 protein on UA and  623 Urine prot/creat ratio.  BLOOD FILM: *** I personally reviewed the patient's peripheral blood smear today.  There was no peripheral blast.  The white blood cells and red blood cells were of normal morphology. There was no schistocytosis or anisocytosis.  The platelets are of normal size and I have verified that there were no platelet clumping.  RADIOGRAPHIC STUDIES: I have personally reviewed the radiological images as listed and agreed with the findings in the report. Dg Wrist Complete Left  Result Date: 03/20/2019 CLINICAL DATA:  Fall 01/12/2019 with lateral wrist pain EXAM: LEFT WRIST - COMPLETE 3+ VIEW COMPARISON:  None. FINDINGS: Nondisplaced fracture through the distal  radial metaphysis. Tiny avulsion fracture at the ulnar styloid. Osteopenia. Located wrist joint. IMPRESSION: 1. Nondisplaced distal radius fracture. 2. Ulnar styloid avulsion fracture. Electronically Signed   By: Monte Fantasia M.D.   On: 03/20/2019 11:30   Ct Angio Chest Pe W And/or Wo Contrast  Result Date: 03/20/2019 CLINICAL DATA:  Fall on October 7th, landed on left side, left-sided rib pain EXAM: CT ANGIOGRAPHY CHEST WITH CONTRAST TECHNIQUE: Multidetector CT imaging of the chest was performed using the standard protocol during bolus administration of intravenous contrast. Multiplanar CT image reconstructions and MIPs were obtained to evaluate the vascular anatomy. CONTRAST:  40m OMNIPAQUE IOHEXOL 350 MG/ML SOLN COMPARISON:  None. FINDINGS: Cardiovascular: Satisfactory opacification of the pulmonary arteries to the segmental level. No evidence of pulmonary embolism. Normal heart size. Left coronary artery calcifications and/or stents. No pericardial effusion. Aortic atherosclerosis. Mediastinum/Nodes: No enlarged mediastinal, hilar, or axillary lymph nodes. Thyroid gland, trachea, and esophagus demonstrate no significant findings. Lungs/Pleura: Lungs are clear. Trace left pleural effusion. Mild bibasilar atelectasis or scarring. Upper Abdomen: No acute abnormality. Musculoskeletal: Osteopenia. There is a minimally displaced acute fracture of the posterior right eighth rib (series 5, image 48). There is a nondisplaced fracture of the posterior left tenth rib (series 5, image 71). There are nondisplaced, subtle irregularities of the anterolateral left fourth through sixth ribs, possibly nondisplaced fractures (series 5, image 46, 52, 57). Review of the MIP images confirms the above findings. IMPRESSION: 1. Negative examination for pulmonary embolism. 2. There is a minimally displaced acute fracture of the posterior right eighth rib (series 5, image 48). There is a nondisplaced fracture of the posterior  left tenth rib (series 5, image 71). There are nondisplaced, subtle irregularities of the anterolateral left fourth through sixth ribs, possibly nondisplaced fractures (series 5, image 46, 52, 57). 3. Trace left pleural effusion. 4. Coronary artery disease.  Aortic Atherosclerosis (ICD10-I70.0). Electronically Signed   By: AEddie CandleM.D.   On: 03/20/2019 11:33   Ct Abdomen Pelvis W Contrast  Result Date: 03/20/2019 CLINICAL DATA:  Fall, landed on left side, pain EXAM: CT ABDOMEN AND PELVIS WITH CONTRAST TECHNIQUE: Multidetector CT imaging of the abdomen and pelvis was performed using the standard protocol following bolus administration of intravenous contrast. CONTRAST:  855mOMNIPAQUE IOHEXOL 350 MG/ML SOLN COMPARISON:  None. FINDINGS: Lower chest: Please see separately dictated CT examination of the chest. Hepatobiliary: No solid liver abnormality is seen. No gallstones, gallbladder wall thickening, or biliary dilatation. Pancreas: Parenchymal atrophy. No pancreatic ductal dilatation or surrounding inflammatory changes. Spleen: Normal in size without significant abnormality. Adrenals/Urinary Tract: Adrenal glands are unremarkable. Kidneys are normal, without renal calculi, solid lesion, or hydronephrosis. Bladder is unremarkable. Stomach/Bowel: Stomach is within normal limits. Appendix appears normal. No evidence of bowel wall thickening, distention, or inflammatory changes. Large burden of stool and stool balls in the colon. Vascular/Lymphatic: Aortic atherosclerosis. No enlarged abdominal or pelvic lymph nodes. Reproductive: Prostatomegaly. Brachytherapy pellets in the prostate. Other: No abdominal wall hernia or abnormality. No abdominopelvic ascites. Musculoskeletal: No acute or significant osseous findings. IMPRESSION: 1.  No evidence of acute traumatic injury in the abdomen or pelvis. 2.  Prostatomegaly status post brachytherapy. 3.  Aortic Atherosclerosis (ICD10-I70.0). Electronically Signed   By:  AlEddie Candle.D.   On: 03/20/2019 11:44     ASSESSMENT & PLAN JoJAYSEN WEY89.o. male with medical history significant for coronary artery disease type 2 diabetes and hyperlipidemia who presents for  evaluation of elevated lambda light chains with proteinuria.  [name] presents as a referral from nephrology with elevated lambda chains.  He has concerning labs with lambda 645.5, with a kappa of 27.9, resulting in a ratio of 0.04. No M protein noted on SPEP, UPEP not collected. Significant proteinuria was noted in his urine studies.  His CMP is concerning for mild elevations in creatinine and calcium with a perfectly normal CBC. 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 Fortunately this patient has stable counts and minimally elevated Cr, assuring we can perform a bone marrow biopsy and assess the results prior to the consideration of therapy. Infiltrative disease (such as amyloidosis) is certainly on the differential and we will assess for that in the bone marrow. If results are unrevealing or we detect a lower than anticipated clonal plasma cell population we can consider a fat pad biopsy vs. biopsy of the kidney to confirm the diagnosis.   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  It is possible the patient is secreting his monoclonal protein into the urine which is why it is not detected in the serum. A UPEP can help [place] further determine if a monoclonal component is present. Some degree of lambda elevation is to be expected with CKD (particularly in this patient with proteinuria and DM type II), however this ratio is markedly high and concerning for an underlying hematological process ( [place] Nephrol 21, 111 (2020). [email or web address]). At this time with only one  organ system clearly affected I do not think it is necessary to pursue a fat bad biopsy, though we can assess for amyloidosis on BmBx with Congo Red Staining, which has a sensitivity for the disease of approximately 63% (Arthritis & Rheumatism, vol. 54, no. 6, pp. 2015-2021, 2006).   We will continue to follow [name] closely and further evaluate his elevated lambda light chains.    #Elevated Lambda Light Chains with No Serum M protein --repeat SPEP and SFLC today. Additionally will collect a UPEP, 24 hour urine protein, quantitative immunoglobulins and Beta-2-microglobulin --will set patient up for bone marrow biopsy to further assess for MM vs light chain deposition disease. Assure a Congo Red stain to assist in r/o amyloidosis --can consider fat pad biopsy/renal biopsy for infiltrative disease if clonal plasma cell population is lower than anticipated.  --will order a bone survey to assess for lytic lesions of the bone.  --Placeholder RTC in 3 weeks or earlier once biopsy results return.   #Prostate Cancer s/p Brachytherapy --assess PSA today while collecting labs --patient previously treated by Dr. Tammi Klippel at [facility] --continue to follow with urology   No orders of the defined types were placed in this encounter.   All questions were answered. The patient knows to call the clinic with any problems, questions or concerns.  A total of more than {CHL ONC TIME VISIT - BWGYK:[id number]} were spent face-to-face with the patient during this encounter and over half of that time was spent on counseling and coordination of care as outlined above.   Literature Support:  Molina-Andjar, A., Robles, P., Cibeira, M.T. et al. The renal range of the ?/? sFLC ratio: best strategy to evaluate multiple myeloma in patients with chronic kidney disease. [place] Nephrol 21, 111 (2020). [email or web address]  -In the group of patients without  active MM (n?=?176), the sFLC ratio increased at  different stages of CKD without pathological significance, with an increase in the number of false positives specially when eGFR is ?55?ml/min. An optimal range was established for patients with eGFR ?55?ml/min/1.73?m2: 0.82-3,6 with maximum sensitivity + specificity for that group with an improvement in the Area under the curve (AUC), 0.91 (0.84-0.97) compared with the current ranges proposed by [place] and [place].  IAline August, Harrel Carina, and Trina Ao, Diagnostic accuracy of subcutaneous abdominal fat tissue aspiration for detecting systemic amyloidosis and its utility in clinical practice, Arthritis & Rheumatism, vol. 54, no. 6, pp. 2015-2021, 2006  -As expected, the sensitivity of bone marrow biopsy turned out to be rather low (63%), whereas the sensitivity of biopsy of the kidney, liver, and heart was high (87-98%). The 63% sensitivity associated with bone marrow biopsy does signify that when detection of an M protein suggests the need for a bone marrow biopsy in order to look for plasma cell dyscrasia, additional staining with Congo red dye may help to detect approximately two?thirds of the patients with amyloidosis, with minimal additional effort. Subcutaneous abdominal fat aspiration is the preferred method for detecting systemic amyloidosis, with sensitivity of 80% associated with use of a routine approach. The use of a thorough assessment (3 fat smears, 2 observers) increased sensitivity to >90%. If the results of fat tissue aspiration are negative, the additional value of a subsequent biopsy of the rectum is negligible.    Ledell Peoples, MD Department of Hematology/Oncology [place] [facility] Phone: [phone] Pager: [phone] Email: [email or web address]    03/28/2019 8:34 AM

## 2019-03-28 NOTE — Patient Instructions (Signed)
Good to see you Please continue the velcro splint  Please try the range of motion exercises  Please use ice if needed  I will call about the xray results.  We'll check about the medicine   Please send me a message in MyChart with any questions or updates.  Please see me back in 4 weeks.   --Dr. Raeford Razor

## 2019-03-28 NOTE — Progress Notes (Signed)
[name] - 85 y.o. male MRN [id number]  Date of birth: [birth year]  SUBJECTIVE:  Including CC & ROS.  Chief Complaint  Patient presents with  . Follow-up    follow up for left wrist    [name] is a 85 y.o. male that is following up for his rib fractures and left distal radius fracture.  He has been wearing the corset and it helps with his rib pain.  He wears this intermittently.  The pain is much improved compared to last week.  He feels well.  The wrist still bothers him intermittently.  Feels pain over the distal radius with some swelling.  Ecchymosis is slowly improving. Denies any numbness or tingling.    Review of Systems  Constitutional: Negative for fever.  HENT: Negative for congestion.   Respiratory: Negative for cough.   Cardiovascular: Negative for chest pain.  Gastrointestinal: Negative for abdominal pain.  Musculoskeletal: Positive for arthralgias and back pain.  Skin: Negative for color change.  Neurological: Negative for weakness.  Hematological: Negative for adenopathy.    HISTORY: Past Medical, Surgical, Social, and Family History Reviewed & Updated per EMR.   Pertinent Historical Findings include:  Past Medical History:  Diagnosis Date  . CAD (coronary artery disease)    [facility] 03/25/11 by Dr. Burt Knack:  pLAD 99%, oCFX 20-30%, pOM1 40%, dAVCFX 20-30%.  EF was normal on nuclear study.  He was treated with a Promus DES to his pLAD.   [name] Colon polyps 1996   villous adenoma  . DM type 2 (diabetes mellitus, type 2) ([place]) 2002  . Fuchs' corneal dystrophy   . Hyperlipidemia   . Hypertension   . Macular degeneration    posterior vitreous vitreous detachment  . Prostate cancer [facility]) dx 2018    Past Surgical History:  Procedure Laterality Date  . CARDIAC CATHETERIZATION  03/25/2011  . cataract Bilateral 2010  . corenea implants  june and sept 2018   [name]  . CORONARY STENT PLACEMENT  03/25/2011  . RADIOACTIVE SEED IMPLANT N/A 05/06/2017   Procedure: RADIOACTIVE SEED IMPLANT/BRACHYTHERAPY IMPLANT;  Surgeon: Kathie Rhodes, MD;  Location: [place];  Service: Urology;  Laterality: N/A;  . SPACE OAR INSTILLATION N/A 05/06/2017   Procedure: SPACE OAR INSTILLATION;  Surgeon: Kathie Rhodes, MD;  Location: [facility];  Service: Urology;  Laterality: N/A;    No Known Allergies  Family History  Problem Relation Age of Onset  . Coronary artery disease Father 48       deceased  . Stomach cancer Mother 73     Social History   Socioeconomic History  . Marital status: Widowed    Spouse name: Not on file  . Number of children: Not on file  . Years of education: Not on file  . Highest education level: Not on file  Occupational History  . Not on file  Social Needs  . Financial resource strain: Not on file  . Food insecurity    Worry: Not on file    Inability: Not on file  . Transportation needs    Medical: Not on file    Non-medical: Not on file  Tobacco Use  . Smoking status: Former Smoker    Packs/day: 0.25    Years: 3.00    Pack years: 0.75    Quit date: 06/07/1958    Years since quitting: 60.8  . Smokeless tobacco: Never Used  Substance and Sexual Activity  . Alcohol use: Yes  Comment: 1 glass wine per day  . Drug use: No  . Sexual activity: Not Currently  Lifestyle  . Physical activity    Days per week: Not on file    Minutes per session: Not on file  . Stress: Not on file  Relationships  . Social Herbalist on phone: Not on file    Gets together: Not on file    Attends religious service: Not on file    Active member of club or organization: Not on file    Attends meetings of clubs or organizations: Not on file    Relationship status: Not on file  . Intimate partner violence    Fear of current or ex partner: Not on file    Emotionally abused: Not on file    Physically abused: Not on file    Forced sexual activity: Not on file  Other Topics Concern  . Not on  file  Social History Narrative  . Not on file     PHYSICAL EXAM:  VS: BP (!) 180/83   Pulse 68   Ht 5\' 4"  (1.626 m)   Wt 128 lb (58.1 kg)   BMI 21.97 kg/m  Physical Exam Gen: NAD, alert, cooperative with exam, well-appearing ENT: normal lips, normal nasal mucosa,  Eye: normal EOM, normal conjunctiva and lids CV:  no edema, +2 pedal pulses   Resp: no accessory muscle use, non-labored,   Skin: no rashes, no areas of induration  Neuro: normal tone, normal sensation to touch Psych:  normal insight, alert and oriented MSK:  Left wrist:  TTP over the distal radius.  Ecchymosis over the medial epicondyle  Normal Elbow ROM  Limited wrist ROm  Normal grip strength  NVI       ASSESSMENT & PLAN:   Osteoporosis with pathological fracture of forearm Little to no pain today. Initial injury on 10/7.  - xray  - velcro splint  - counseled on HEP and supportive care - will try prolia   Multiple closed fractures of ribs of left side Improvement of the pain. No SOB. Has been wearing the rib belt - continue rib belt  - counseled on supportive care  -Continue spirometry.

## 2019-03-29 ENCOUNTER — Other Ambulatory Visit: Payer: Self-pay | Admitting: Hematology and Oncology

## 2019-03-29 DIAGNOSIS — D8989 Other specified disorders involving the immune mechanism, not elsewhere classified: Secondary | ICD-10-CM

## 2019-03-29 DIAGNOSIS — D472 Monoclonal gammopathy: Secondary | ICD-10-CM

## 2019-03-29 DIAGNOSIS — C61 Malignant neoplasm of prostate: Secondary | ICD-10-CM

## 2019-03-30 ENCOUNTER — Telehealth: Payer: Self-pay | Admitting: *Deleted

## 2019-03-30 ENCOUNTER — Ambulatory Visit: Payer: Medicare HMO

## 2019-03-30 ENCOUNTER — Encounter: Payer: Medicare HMO | Admitting: Hematology and Oncology

## 2019-03-30 NOTE — Telephone Encounter (Signed)
TCT patient regarding today's appt. Pt did not show for appt. Call made to check on patient and reschedule. No answer but was able to leave vm message for pt to return call so his appt can be re-scheduled.

## 2019-03-30 NOTE — Telephone Encounter (Signed)
Received call back from patient. He states he was unaware of his appt today.  He states he had recent rib and wrist fractures and was taking pain medication so he thinks he might have missed a call regarding appt. Pt is able to reschedule to 04/06/19 @ 1pm. Advised to come 15 minutes earlier in order to register upon arrival. Scheduling message sent.

## 2019-04-05 ENCOUNTER — Other Ambulatory Visit: Payer: Self-pay

## 2019-04-05 NOTE — Progress Notes (Signed)
[place] [phone]   Fax:([phone] [place] NOTE  Patient Care Team: [name], Donald Apa, DO as PCP - General (Family Medicine) Donald Rhodes, MD as Consulting Physician (Urology) Donald Pita, MD as Consulting Physician (Radiation Oncology) Donald Halsted, MD as Referring Physician (Ophthalmology)  Hematological/Oncological History #Elevated Serum Free Light Chains 1) 02/15/19: found to have ambda chains 645.5, kappa 27.9 with a ratio of 0.04, no serum M protein during nephrology work up.  2) 04/06/19: establish care with Dr. Lorenso [name]  #Prostate Cancer, Stage T2c Adenocarcinoma. Gleason Score of 3+4 1) 05/06/17: Insertion of radioactive I-125 seeds into the prostate gland with [name] of SpaceOAR;145Gy, definitive therapy. Radiation oncologist was Dr. Tammi [name] at [facility] 2) subsequently followed by outside urology with serial PSA measurements. Reported last measurement in Oct 2020 at PSA 0.2.   CHIEF COMPLAINTS/PURPOSE OF CONSULTATION:  Elevated Serum Free Light Chains  HISTORY OF PRESENTING ILLNESS:  [name] 85 y.o. male with medical history significant for CAD s/p PCI in 2012, DM type II, Prostate cancer s/p brachytherapy, and HTN who presents for evaluation of markedly elevated serum free light chains.   [name] was referred by Dr. Hollie [name] at [facility]. On review of his outside records, he was seen on 02/15/2019 for evaluation of his CKD. His Cr had progressively worsened from Cr 1.24 in 09/2017 to 1.36 in 03/2018 to 1.42 10/2018 and finally 1.94 in 01/2019. As part of his routine workup he underwent SPEP and SFLC testing which revealed no M protein, however lambda chains 645.5, kappa 27.9 with a ratio of 0.04. His urine prot/cr ratio measured at that time was 623. He was referred to [facility] for further evaluation and management.  On exam today [name]. He just recently competed in the [employer] and won significant medals in his categories. He is markedly fit and recovering [name] from his traumatic fall earlier this month (pulled down by his dog, broke bones in left wrist and 4 ribs). He notes normal urination/defecation, however his urine has been bubbling more lately. His urine is of normal color and normal frequency. He reports stable weight, having weighed the same 130 lbs since high school. He eats out every night at different restaurants and has a varied diet of lamb, fish, and chicken. He overall has no complaints and believes himself to be in good physical condition. He denies any fatigue, shortness of breath, back pain, fevers, chills, or weight loss.   A full 10 point ROS is listed below.   MEDICAL HISTORY:  Past Medical History:  Diagnosis Date  . CAD (coronary artery disease)    LHC 03/25/11 by Dr. Burt [name]:  pLAD 99%, oCFX 20-30%, pOM1 40%, dAVCFX 20-30%.  EF was normal on nuclear study.  He was treated with a Promus DES to his pLAD.   [name] Colon polyps 1996   villous adenoma  . DM type 2 (diabetes mellitus, type 2) ([place]) 2002  . Fuchs' corneal dystrophy   . Hyperlipidemia   . Hypertension   . Macular degeneration    posterior vitreous vitreous detachment  . Prostate cancer [facility]) dx 2018    SURGICAL HISTORY: Past Surgical History:  Procedure Laterality Date  . CARDIAC CATHETERIZATION  03/25/2011  . cataract Bilateral 2010  . corenea implants  june and sept 2018   dr [name] [facility]  . CORONARY STENT [name]  03/25/2011  . RADIOACTIVE SEED IMPLANT N/A 05/06/2017   Procedure: RADIOACTIVE  SEED IMPLANT/BRACHYTHERAPY IMPLANT;  Surgeon: Donald Rhodes, MD;  Location: [facility];  Service: Urology;  Laterality: N/A;  . SPACE OAR INSTILLATION N/A 05/06/2017   Procedure: SPACE OAR INSTILLATION;  Surgeon: Donald Rhodes, MD;  Location: [facility];  Service: Urology;  Laterality: N/A;    SOCIAL HISTORY: Social History    Socioeconomic History  . Marital status: Widowed    Spouse name: Not on file  . Number of children: Not on file  . Years of education: Not on file  . Highest education level: Not on file  Occupational History  . Not on file  Social Needs  . Financial resource strain: Not on file  . Food insecurity    Worry: Not on file    Inability: Not on file  . Transportation needs    Medical: Not on file    Non-medical: Not on file  Tobacco Use  . Smoking status: Former Smoker    Packs/day: 0.25    Years: 3.00    Pack years: 0.75    Quit date: 06/07/1958    Years since quitting: 60.8  . Smokeless tobacco: Never Used  Substance and Sexual Activity  . Alcohol use: Yes    Comment: 1 glass wine per day  . Drug use: No  . Sexual activity: Not Currently  Lifestyle  . Physical activity    Days per week: Not on file    Minutes per session: Not on file  . Stress: Not on file  Relationships  . Social Herbalist on phone: Not on file    Gets together: Not on file    Attends religious service: Not on file    Active member of club or organization: Not on file    Attends meetings of clubs or organizations: Not on file    Relationship status: Not on file  . Intimate partner violence    Fear of current or ex partner: Not on file    Emotionally abused: Not on file    Physically abused: Not on file    Forced sexual activity: Not on file  Other Topics Concern  . Not on file  Social History Narrative  . Not on file    FAMILY HISTORY: Family History  Problem Relation Age of Onset  . Coronary artery disease Father 12       deceased  . Stomach cancer Mother 47    ALLERGIES:  has No Known Allergies.  MEDICATIONS:  Current Outpatient Medications  Medication Sig Dispense Refill  . Alcohol Swabs PADS Use as directed once a day 100 each 1  . amLODipine (NORVASC) 5 MG tablet Take 1 tablet (5 mg total) by mouth daily. 90 tablet 1  . aspirin 81 MG tablet Take 81 mg by mouth daily.     [name] atorvastatin (LIPITOR) 10 MG tablet TAKE 1 TABLET EVERY DAY 90 tablet 1  . Blood Glucose Calibration (TRUE METRIX LEVEL 1) Low SOLN Use as directed. 1 each 0  . Blood Glucose Monitoring Suppl (TRUE METRIX AIR GLUCOSE METER) w/Device KIT 1 each by Does not apply route daily. Use as directed once a day 1 kit 0  . brimonidine-timolol (COMBIGAN) 0.2-0.5 % ophthalmic solution Place 1 drop into the right eye 2 (two) times daily.     [name] docusate sodium (COLACE) 100 MG capsule Take 1 capsule (100 mg total) by mouth every 12 (twelve) hours. 60 capsule 0  . fish oil-omega-3 fatty acids 1000 MG capsule Take  2 g by mouth daily.      [name] glimepiride (AMARYL) 4 MG tablet TAKE 1 TABLET EVERY DAY WITH BREAKFAST 90 tablet 1  . glucose blood (TRUE METRIX BLOOD GLUCOSE TEST) test strip Use as directed once a day.  Dx code: E11.9 100 each 12  . losartan-hydrochlorothiazide (HYZAAR) 100-25 MG tablet TAKE 1 TABLET EVERY DAY 90 tablet 1  . Multiple Vitamin (MULTIVITAMIN) tablet Take 1 tablet by mouth daily.    . prednisoLONE acetate (PRED FORTE) 1 % ophthalmic suspension Place 1 drop into the left eye 4 (four) times daily.     . SitaGLIPtin-MetFORMIN HCl (JANUMET XR) [phone] MG TB24 Take 1 tablet by mouth daily. 90 tablet 1  . tamsulosin (FLOMAX) 0.4 MG CAPS capsule TAKE 1 CAPSULE (0.4 MG TOTAL) BY MOUTH DAILY. 90 capsule 3  . TRUEplus Lancets 30G MISC Use as directed once a day.  E11.9 100 each 1   No current facility-administered medications for this visit.     REVIEW OF SYSTEMS:   Constitutional: ( - ) fevers, ( - )  chills , ( - ) night sweats Eyes: ( - ) blurriness of vision, ( - ) double vision, ( - ) watery eyes Ears, nose, mouth, throat, and face: ( - ) mucositis, ( - ) sore throat Respiratory: ( - ) cough, ( - ) dyspnea, ( - ) wheezes Cardiovascular: ( - ) palpitation, ( - ) chest discomfort, ( - ) lower extremity swelling Gastrointestinal:  ( - ) nausea, ( - ) heartburn, ( - ) change in bowel habits  Skin: ( - ) abnormal skin rashes Lymphatics: ( - ) new lymphadenopathy, ( - ) easy bruising Neurological: ( - ) numbness, ( - ) tingling, ( - ) new weaknesses Behavioral/Psych: ( - ) mood change, ( - ) new changes  All other systems were reviewed with the patient and are negative.  PHYSICAL EXAMINATION: ECOG PERFORMANCE STATUS: 0 - Asymptomatic  Vitals:   04/06/19 1317  BP: (!) 143/68  Pulse: 81  Resp: 18  Temp: 98.7 F (37.1 C)  SpO2: 98%   Filed Weights   04/06/19 1317  Weight: 131 lb (59.4 kg)    GENERAL: [name] appearing elderly Caucasian male in NAD  SKIN: skin color, texture, turgor are normal, no rashes or significant lesions EYES: conjunctiva are pink and non-injected, sclera clear LYMPH:  no palpable lymphadenopathy in the cervical, axillary or inguinal LUNGS: clear to auscultation and percussion with normal breathing effort HEART: regular rate & rhythm and no murmurs and no lower extremity edema ABDOMEN: soft, non-tender, non-distended, normal bowel sounds Musculoskeletal: no cyanosis of digits and no clubbing  PSYCH: alert & oriented x 3, fluent speech NEURO: no focal motor/sensory deficits  LABORATORY DATA:  I have reviewed the data as listed Recent Results (from the past 2160 hour(s))  Microalbumin / creatinine urine ratio     Status: Abnormal   Collection Time: 01/31/19  8:03 AM  Result Value Ref Range   Microalb, Ur 14.1 (H) 0.0 - 1.9 mg/dL   Creatinine,U 149.8 mg/dL   Microalb Creat Ratio 9.4 0.0 - 30.0 mg/g  Comprehensive metabolic panel     Status: Abnormal   Collection Time: 01/31/19  8:03 AM  Result Value Ref Range   Sodium 145 135 - 145 mEq/L   Potassium 3.6 3.5 - 5.1 mEq/L   Chloride 105 96 - 112 mEq/L   CO2 28 19 - 32 mEq/L   Glucose, Bld 56 (L) 70 -  99 mg/dL   BUN 37 (H) 6 - 23 mg/dL   Creatinine, Ser 1.94 (H) 0.40 - 1.50 mg/dL   Total Bilirubin 0.7 0.2 - 1.2 mg/dL   Alkaline Phosphatase 66 39 - 117 U/L   AST 21 0 - 37 U/L   ALT 15 0 - 53  U/L   Total Protein 6.6 6.0 - 8.3 g/dL   Albumin 4.3 3.5 - 5.2 g/dL   Calcium 9.6 8.4 - 10.5 mg/dL   GFR 33.06 (L) >60.00 mL/min  Hemoglobin A1c     Status: Abnormal   Collection Time: 01/31/19  8:03 AM  Result Value Ref Range   Hgb A1c MFr Bld 7.2 (H) 4.6 - 6.5 %    Comment: Glycemic Control Guidelines for People with Diabetes:Non Diabetic:  <6%Goal of Therapy: <7%Additional Action Suggested:  >8%   Lipid panel     Status: None   Collection Time: 01/31/19  8:03 AM  Result Value Ref Range   Cholesterol 135 0 - 200 mg/dL    Comment: ATP III Classification       Desirable:  < 200 mg/dL               Borderline High:  200 - 239 mg/dL          High:  > = 240 mg/dL   Triglycerides 75.0 0.0 - 149.0 mg/dL    Comment: Normal:  <150 mg/dLBorderline High:  150 - 199 mg/dL   HDL 47.70 >39.00 mg/dL   VLDL 15.0 0.0 - 40.0 mg/dL   LDL Cholesterol 73 0 - 99 mg/dL   Total CHOL/HDL Ratio 3     Comment:                Men          Women1/2 Average Risk     3.4          3.3Average Risk          5.0          4.42X Average Risk          9.6          7.13X Average Risk          15.0          11.0                       NonHDL 87.76     Comment: NOTE:  Non-HDL goal should be 30 mg/dL higher than patient's LDL goal (i.e. LDL goal of < 70 mg/dL, would have non-HDL goal of < 100 mg/dL)  CBC with Differential/Platelet     Status: Abnormal   Collection Time: 03/20/19  9:20 AM  Result Value Ref Range   WBC 11.6 (H) 4.0 - 10.5 K/uL   RBC 4.11 (L) 4.22 - 5.81 MIL/uL   Hemoglobin 12.4 (L) 13.0 - 17.0 g/dL   HCT 38.2 (L) 39.0 - 52.0 %   MCV 92.9 80.0 - 100.0 fL   MCH 30.2 26.0 - 34.0 pg   MCHC 32.5 30.0 - 36.0 g/dL   RDW 14.0 11.5 - 15.5 %   Platelets 239 150 - 400 K/uL   nRBC 0.0 0.0 - 0.2 %   Neutrophils Relative % 70 %   Neutro Abs 8.2 (H) 1.7 - 7.7 K/uL   Lymphocytes Relative 18 %   Lymphs Abs 2.1 0.7 - 4.0 K/uL   Monocytes Relative 9 %   Monocytes Absolute 1.0 0.1 -  1.0 K/uL   Eosinophils Relative 2 %    Eosinophils Absolute 0.2 0.0 - 0.5 K/uL   Basophils Relative 1 %   Basophils Absolute 0.1 0.0 - 0.1 K/uL   Immature Granulocytes 0 %   Abs Immature Granulocytes 0.03 0.00 - 0.07 K/uL    Comment: Performed at [facility], [address] [place], [place] [address]  Comprehensive metabolic panel     Status: Abnormal   Collection Time: 03/20/19  9:20 AM  Result Value Ref Range   Sodium 138 135 - 145 mmol/L   Potassium 3.6 3.5 - 5.1 mmol/L   Chloride 103 98 - 111 mmol/L   CO2 25 22 - 32 mmol/L   Glucose, Bld 338 (H) 70 - 99 mg/dL   BUN 30 (H) 8 - 23 mg/dL   Creatinine, Ser 1.45 (H) 0.61 - 1.24 mg/dL   Calcium 9.1 8.9 - 10.3 mg/dL   Total Protein 6.9 6.5 - 8.1 g/dL   Albumin 3.6 3.5 - 5.0 g/dL   AST 17 15 - 41 U/L   ALT 16 0 - 44 U/L   Alkaline Phosphatase 76 38 - 126 U/L   Total Bilirubin 0.9 0.3 - 1.2 mg/dL   GFR calc non Af Amer 44 (L) >60 mL/min   GFR calc Af Amer 51 (L) >60 mL/min   Anion gap 10 5 - 15    Comment: Performed at [facility], [address] [place], [place] [address]    RADIOGRAPHIC STUDIES: I have personally reviewed the radiological images as listed and agreed with the findings in the report: reviewed films for lytic lesions of the bone. I was unable to appreciate any discrete lytic lesions on the films below.   Dg Wrist 2 Views Left  Result Date: 03/28/2019 CLINICAL DATA:  Left wrist fracture. EXAM: LEFT WRIST - 2 VIEW COMPARISON:  March 20, 2019. FINDINGS: Minimally displaced ulnar styloid fracture is noted. Nondisplaced distal radial metaphyseal fracture is noted which is not significantly changed compared to prior exam. No soft tissue abnormality is noted. IMPRESSION: Stable appearance of ulnar styloid and distal radial fractures. Electronically Signed   By: Marijo Conception M.D.   On: 03/28/2019 13:30   Dg Wrist Complete Left  Result Date: 03/20/2019 CLINICAL DATA:  Fall 01/12/2019 with lateral wrist pain EXAM: LEFT WRIST -  COMPLETE 3+ VIEW COMPARISON:  None. FINDINGS: Nondisplaced fracture through the distal radial metaphysis. Tiny avulsion fracture at the ulnar styloid. Osteopenia. Located wrist joint. IMPRESSION: 1. Nondisplaced distal radius fracture. 2. Ulnar styloid avulsion fracture. Electronically Signed   By: Monte Fantasia M.D.   On: 03/20/2019 11:30   Ct Angio Chest Pe W And/or Wo Contrast  Result Date: 03/20/2019 CLINICAL DATA:  Fall on October 7th, landed on left side, left-sided rib pain EXAM: CT ANGIOGRAPHY CHEST WITH CONTRAST TECHNIQUE: Multidetector CT imaging of the chest was performed using the standard protocol during bolus administration of intravenous contrast. Multiplanar CT image reconstructions and MIPs were obtained to evaluate the vascular anatomy. CONTRAST:  61m OMNIPAQUE IOHEXOL 350 MG/ML SOLN COMPARISON:  None. FINDINGS: Cardiovascular: Satisfactory opacification of the pulmonary arteries to the segmental level. No evidence of pulmonary embolism. Normal heart size. Left coronary artery calcifications and/or stents. No pericardial effusion. Aortic atherosclerosis. Mediastinum/Nodes: No enlarged mediastinal, hilar, or axillary lymph nodes. Thyroid gland, trachea, and esophagus demonstrate no significant findings. Lungs/Pleura: Lungs are clear. Trace left pleural effusion. Mild bibasilar atelectasis or scarring. Upper Abdomen: No acute  abnormality. Musculoskeletal: Osteopenia. There is a minimally displaced acute fracture of the posterior right eighth rib (series 5, image 48). There is a nondisplaced fracture of the posterior left tenth rib (series 5, image 71). There are nondisplaced, subtle irregularities of the anterolateral left fourth through sixth ribs, possibly nondisplaced fractures (series 5, image 46, 52, 57). Review of the MIP images confirms the above findings. IMPRESSION: 1. Negative examination for pulmonary embolism. 2. There is a minimally displaced acute fracture of the posterior right  eighth rib (series 5, image 48). There is a nondisplaced fracture of the posterior left tenth rib (series 5, image 71). There are nondisplaced, subtle irregularities of the anterolateral left fourth through sixth ribs, possibly nondisplaced fractures (series 5, image 46, 52, 57). 3. Trace left pleural effusion. 4. Coronary artery disease.  Aortic Atherosclerosis (ICD10-I70.0). Electronically Signed   By: Eddie Candle M.D.   On: 03/20/2019 11:33   Ct Abdomen Pelvis W Contrast  Result Date: 03/20/2019 CLINICAL DATA:  Fall, landed on left side, pain EXAM: CT ABDOMEN AND PELVIS WITH CONTRAST TECHNIQUE: Multidetector CT imaging of the abdomen and pelvis was performed using the standard protocol following bolus administration of intravenous contrast. CONTRAST:  35m OMNIPAQUE IOHEXOL 350 MG/ML SOLN COMPARISON:  None. FINDINGS: Lower chest: Please see separately dictated CT examination of the chest. Hepatobiliary: No solid liver abnormality is seen. No gallstones, gallbladder wall thickening, or biliary dilatation. Pancreas: Parenchymal atrophy. No pancreatic ductal dilatation or surrounding inflammatory changes. Spleen: Normal in size without significant abnormality. Adrenals/Urinary Tract: Adrenal glands are unremarkable. Kidneys are normal, without renal calculi, solid lesion, or hydronephrosis. Bladder is unremarkable. Stomach/Bowel: Stomach is within normal limits. Appendix appears normal. No evidence of bowel wall thickening, distention, or inflammatory changes. Large burden of stool and stool balls in the colon. Vascular/Lymphatic: Aortic atherosclerosis. No enlarged abdominal or pelvic lymph nodes. Reproductive: Prostatomegaly. Brachytherapy pellets in the prostate. Other: No abdominal wall hernia or abnormality. No abdominopelvic ascites. Musculoskeletal: No acute or significant osseous findings. IMPRESSION: 1.  No evidence of acute traumatic injury in the abdomen or pelvis. 2.  Prostatomegaly status post  brachytherapy. 3.  Aortic Atherosclerosis (ICD10-I70.0). Electronically Signed   By: AEddie CandleM.D.   On: 03/20/2019 11:44    ASSESSMENT & PLAN JSHERMAR FRIEDLAND84y.o. male with medical history significant for CAD s/p PCI in 2012, DM type II, Prostate cancer s/p brachytherapy, and HTN who presents for evaluation of markedly elevated serum free light chains.   Mr. BGolaszewskifindings are concerning for a monoclonal [name]. It is possible that no M protein is detectable in the serum because it is being excreted in the urine. Collecting a UPEP today will help uKoreadetermine if there is detectable M protein. Either way, with such a markedly elevated ratio I would recommend we proceed with a bone marrow biopsy with [place] Red staining to r/o amyloidosis (Leukemia. 2010;24(6):1121-1127). In the event his plasma cell concentration is lower than anticipated we can consider a fat pad biopsy to assess for light chain deposition, which would explain his pronounced proteinuria (Arthritis & Rheumatism, vol. 54, no. 6, pp. 2015-2021, 2006.).   #Elevated Serum Free Light Chains --today will order SPEP, UPEP, SFLC, Quant Immunoglobulins and IFE. Also will collect a beta-2-microglobulin -- baseline CMP, CBC, and 24Hr Urine protein to be collected as [name] --despite the lack of circulating serum M protein, with [name] SFLC I would recommend proceeding with a bone marrow biopsy as this time (assure Congo Red stains).  If plasma cells/M protein appear disproportionately low to [facility] can consider a fat pad biopsy.  --we will order a metastatic survey today to assess for lytic lesions. His prior CT scans on 03/20/2019 are reassuring as no lytic lesions noted at that time.  --f/u in 3 weeks time to review the results of the lab studies and bone marrow biopsy.   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 Gleason Score of 3+4 --[name] in Nov 2018 --continue to f/u with  outside urology group for routine PSA monitoring, though if we are following [name] this is something we can monitor as [name].  --last PSA collected earlier this month at [facility], found to be PSA 0.2.   All questions were answered. The patient knows to call the clinic with any problems, questions or concerns.  A total of more than 60 minutes were spent face-to-face with the patient during this encounter and over half of that time was spent on counseling and coordination of care as outlined above.   Ledell Peoples, MD Department of Hematology/Oncology [place] at [facility] Phone: [phone] Pager: [phone] Email: [email or web address]   04/06/2019 2:18 PM  Literature Support    1) Marylyn Ishihara RA, Durie BG, Rajkumar SV, et al. Monoclonal [name] of undetermined significance (MGUS) and smoldering (asymptomatic) multiple myeloma: IMWG consensus perspectives risk factors for progression and guidelines for monitoring and management. Leukemia. 2010;24(6):1121-1127. doi:10.1038/leu.2010.60   --If a patient with apparent MGUS has a serum monoclonal protein >15 g/l, IgA or IgM protein type, or an abnormal FLC ratio, a BM aspirate and biopsy should be carried out at baseline to rule out underlying PC malignancy.   2) Marylyn Ishihara RA, Therneau TM, Rajkumar SV, et al. Prevalence of monoclonal [name] of undetermined significance. N Engl J Med 2956;213:[id number]   --MGUS was found in 3.2 percent of persons 16 years of age or older and 5.3 percent of persons 27 years of age or older.  3)  I. Aline August, Napoleon, and Trina Ao, "Diagnostic accuracy of subcutaneous abdominal fat tissue aspiration for detecting systemic amyloidosis and its utility in clinical practice," Arthritis & Rheumatism, vol. 54, no. 6, pp. 2015-2021, 2006.  --The tissue source impacts the likelihood of discovering amyloid deposits. Bone  marrow biopsy sensitivity has been estimated at 63%. Kidney, liver, or cardiac biopsies have sensitivity as high as 87-98% but are more invasive.

## 2019-04-06 ENCOUNTER — Ambulatory Visit (INDEPENDENT_AMBULATORY_CARE_PROVIDER_SITE_OTHER): Payer: Medicare HMO | Admitting: Family Medicine

## 2019-04-06 ENCOUNTER — Inpatient Hospital Stay: Payer: Medicare HMO | Attending: Hematology and Oncology | Admitting: Hematology and Oncology

## 2019-04-06 ENCOUNTER — Inpatient Hospital Stay: Payer: Medicare HMO

## 2019-04-06 ENCOUNTER — Encounter: Payer: Self-pay | Admitting: Family Medicine

## 2019-04-06 ENCOUNTER — Other Ambulatory Visit: Payer: Self-pay

## 2019-04-06 VITALS — BP 160/64 | HR 67 | Temp 97.2°F | Resp 18 | Ht 64.0 in | Wt 130.0 lb

## 2019-04-06 VITALS — BP 143/68 | HR 81 | Temp 98.7°F | Resp 18 | Ht 64.0 in | Wt 131.0 lb

## 2019-04-06 DIAGNOSIS — E1122 Type 2 diabetes mellitus with diabetic chronic kidney disease: Secondary | ICD-10-CM | POA: Insufficient documentation

## 2019-04-06 DIAGNOSIS — M8580 Other specified disorders of bone density and structure, unspecified site: Secondary | ICD-10-CM | POA: Insufficient documentation

## 2019-04-06 DIAGNOSIS — Z0000 Encounter for general adult medical examination without abnormal findings: Secondary | ICD-10-CM | POA: Diagnosis not present

## 2019-04-06 DIAGNOSIS — E1169 Type 2 diabetes mellitus with other specified complication: Secondary | ICD-10-CM

## 2019-04-06 DIAGNOSIS — C61 Malignant neoplasm of prostate: Secondary | ICD-10-CM

## 2019-04-06 DIAGNOSIS — E1136 Type 2 diabetes mellitus with diabetic cataract: Secondary | ICD-10-CM

## 2019-04-06 DIAGNOSIS — Z23 Encounter for immunization: Secondary | ICD-10-CM | POA: Diagnosis not present

## 2019-04-06 DIAGNOSIS — I10 Essential (primary) hypertension: Secondary | ICD-10-CM

## 2019-04-06 DIAGNOSIS — Z79899 Other long term (current) drug therapy: Secondary | ICD-10-CM | POA: Insufficient documentation

## 2019-04-06 DIAGNOSIS — E1165 Type 2 diabetes mellitus with hyperglycemia: Secondary | ICD-10-CM | POA: Insufficient documentation

## 2019-04-06 DIAGNOSIS — N189 Chronic kidney disease, unspecified: Secondary | ICD-10-CM | POA: Insufficient documentation

## 2019-04-06 DIAGNOSIS — E785 Hyperlipidemia, unspecified: Secondary | ICD-10-CM | POA: Diagnosis not present

## 2019-04-06 DIAGNOSIS — I129 Hypertensive chronic kidney disease with stage 1 through stage 4 chronic kidney disease, or unspecified chronic kidney disease: Secondary | ICD-10-CM | POA: Diagnosis not present

## 2019-04-06 DIAGNOSIS — D472 Monoclonal gammopathy: Secondary | ICD-10-CM | POA: Insufficient documentation

## 2019-04-06 DIAGNOSIS — R778 Other specified abnormalities of plasma proteins: Secondary | ICD-10-CM | POA: Diagnosis not present

## 2019-04-06 HISTORY — DX: Type 2 diabetes mellitus with other specified complication: E11.69

## 2019-04-06 HISTORY — DX: Monoclonal gammopathy: D47.2

## 2019-04-06 LAB — MICROALBUMIN / CREATININE URINE RATIO
Creatinine,U: 127.2 mg/dL
Microalb Creat Ratio: 39.9 mg/g — ABNORMAL HIGH (ref 0.0–30.0)
Microalb, Ur: 50.7 mg/dL — ABNORMAL HIGH (ref 0.0–1.9)

## 2019-04-06 LAB — CBC WITH DIFFERENTIAL (CANCER CENTER ONLY)
Abs Immature Granulocytes: 0.03 10*3/uL (ref 0.00–0.07)
Basophils Absolute: 0.1 10*3/uL (ref 0.0–0.1)
Basophils Relative: 1 %
Eosinophils Absolute: 0.1 10*3/uL (ref 0.0–0.5)
Eosinophils Relative: 1 %
HCT: 36.9 % — ABNORMAL LOW (ref 39.0–52.0)
Hemoglobin: 12.6 g/dL — ABNORMAL LOW (ref 13.0–17.0)
Immature Granulocytes: 0 %
Lymphocytes Relative: 18 %
Lymphs Abs: 1.9 10*3/uL (ref 0.7–4.0)
MCH: 31 pg (ref 26.0–34.0)
MCHC: 34.1 g/dL (ref 30.0–36.0)
MCV: 90.7 fL (ref 80.0–100.0)
Monocytes Absolute: 0.9 10*3/uL (ref 0.1–1.0)
Monocytes Relative: 8 %
Neutro Abs: 7.5 10*3/uL (ref 1.7–7.7)
Neutrophils Relative %: 72 %
Platelet Count: 319 10*3/uL (ref 150–400)
RBC: 4.07 MIL/uL — ABNORMAL LOW (ref 4.22–5.81)
RDW: 13.9 % (ref 11.5–15.5)
WBC Count: 10.5 10*3/uL (ref 4.0–10.5)
nRBC: 0 % (ref 0.0–0.2)

## 2019-04-06 LAB — COMPREHENSIVE METABOLIC PANEL
ALT: 14 U/L (ref 0–53)
AST: 14 U/L (ref 0–37)
Albumin: 4 g/dL (ref 3.5–5.2)
Alkaline Phosphatase: 182 U/L — ABNORMAL HIGH (ref 39–117)
BUN: 23 mg/dL (ref 6–23)
CO2: 33 mEq/L — ABNORMAL HIGH (ref 19–32)
Calcium: 9.5 mg/dL (ref 8.4–10.5)
Chloride: 104 mEq/L (ref 96–112)
Creatinine, Ser: 1.23 mg/dL (ref 0.40–1.50)
GFR: 55.9 mL/min — ABNORMAL LOW (ref >60.00)
Glucose, Bld: 143 mg/dL — ABNORMAL HIGH (ref 70–99)
Potassium: 4.4 mEq/L (ref 3.5–5.1)
Sodium: 144 mEq/L (ref 135–145)
Total Bilirubin: 0.5 mg/dL (ref 0.2–1.2)
Total Protein: 6.5 g/dL (ref 6.0–8.3)

## 2019-04-06 LAB — LIPID PANEL
Cholesterol: 122 mg/dL (ref 0–200)
HDL: 38.4 mg/dL — ABNORMAL LOW (ref >39.00)
LDL Cholesterol: 69 mg/dL (ref 0–99)
NonHDL: 83.58
Total CHOL/HDL Ratio: 3
Triglycerides: 74 mg/dL (ref 0.0–149.0)
VLDL: 14.8 mg/dL (ref 0.0–40.0)

## 2019-04-06 LAB — CMP (CANCER CENTER ONLY)
ALT: 16 U/L (ref 0–44)
AST: 14 U/L — ABNORMAL LOW (ref 15–41)
Albumin: 3.8 g/dL (ref 3.5–5.0)
Alkaline Phosphatase: 211 U/L — ABNORMAL HIGH (ref 38–126)
Anion gap: 11 (ref 5–15)
BUN: 26 mg/dL — ABNORMAL HIGH (ref 8–23)
CO2: 29 mmol/L (ref 22–32)
Calcium: 9.5 mg/dL (ref 8.9–10.3)
Chloride: 105 mmol/L (ref 98–111)
Creatinine: 1.38 mg/dL — ABNORMAL HIGH (ref 0.61–1.24)
GFR, Est AFR Am: 54 mL/min — ABNORMAL LOW (ref >60)
GFR, Estimated: 46 mL/min — ABNORMAL LOW (ref >60)
Glucose, Bld: 183 mg/dL — ABNORMAL HIGH (ref 70–99)
Potassium: 4 mmol/L (ref 3.5–5.1)
Sodium: 145 mmol/L (ref 135–145)
Total Bilirubin: 0.4 mg/dL (ref 0.3–1.2)
Total Protein: 7.5 g/dL (ref 6.5–8.1)

## 2019-04-06 LAB — LACTATE DEHYDROGENASE: LDH: 173 U/L (ref 98–192)

## 2019-04-06 LAB — HEMOGLOBIN A1C: Hgb A1c MFr Bld: 7.3 % — ABNORMAL HIGH (ref 4.6–6.5)

## 2019-04-06 LAB — SAVE SMEAR(SSMR), FOR PROVIDER SLIDE REVIEW

## 2019-04-06 NOTE — Patient Instructions (Signed)
Preventive Care 83 Years and Older, Male Preventive care refers to lifestyle choices and visits with your health care provider that can promote health and wellness. This includes:  A yearly physical exam. This is also called an annual well check.  Regular dental and eye exams.  Immunizations.  Screening for certain conditions.  Healthy lifestyle choices, such as diet and exercise. What can I expect for my preventive care visit? Physical exam Your health care provider will check:  Height and weight. These may be used to calculate body mass index (BMI), which is a measurement that tells if you are at a healthy weight.  Heart rate and blood pressure.  Your skin for abnormal spots. Counseling Your health care provider may ask you questions about:  Alcohol, tobacco, and drug use.  Emotional well-being.  Home and relationship well-being.  Sexual activity.  Eating habits.  History of falls.  Memory and ability to understand (cognition).  Work and work Statistician. What immunizations do I need?  Influenza (flu) vaccine  This is recommended every year. Tetanus, diphtheria, and pertussis (Tdap) vaccine  You may need a Td booster every 10 years. Varicella (chickenpox) vaccine  You may need this vaccine if you have not already been vaccinated. Zoster (shingles) vaccine  You may need this after age 50. Pneumococcal conjugate (PCV13) vaccine  One dose is recommended after age 24. Pneumococcal polysaccharide (PPSV23) vaccine  One dose is recommended after age 33. Measles, mumps, and rubella (MMR) vaccine  You may need at least one dose of MMR if you were born in 1957 or later. You may also need a second dose. Meningococcal conjugate (MenACWY) vaccine  You may need this if you have certain conditions. Hepatitis A vaccine  You may need this if you have certain conditions or if you travel or work in places where you may be exposed to hepatitis A. Hepatitis B vaccine   You may need this if you have certain conditions or if you travel or work in places where you may be exposed to hepatitis B. Haemophilus influenzae type b (Hib) vaccine  You may need this if you have certain conditions. You may receive vaccines as individual doses or as more than one vaccine together in one shot (combination vaccines). Talk with your health care provider about the risks and benefits of combination vaccines. What tests do I need? Blood tests  Lipid and cholesterol levels. These may be checked every 5 years, or more frequently depending on your overall health.  Hepatitis C test.  Hepatitis B test. Screening  Lung cancer screening. You may have this screening every year starting at age 74 if you have a 30-pack-year history of smoking and currently smoke or have quit within the past 15 years.  Colorectal cancer screening. All adults should have this screening starting at age 57 and continuing until age 54. Your health care provider may recommend screening at age 47 if you are at increased risk. You will have tests every 1-10 years, depending on your results and the type of screening test.  Prostate cancer screening. Recommendations will vary depending on your family history and other risks.  Diabetes screening. This is done by checking your blood sugar (glucose) after you have not eaten for a while (fasting). You may have this done every 1-3 years.  Abdominal aortic aneurysm (AAA) screening. You may need this if you are a current or former smoker.  Sexually transmitted disease (STD) testing. Follow these instructions at home: Eating and drinking  Eat  a diet that includes fresh fruits and vegetables, whole grains, lean protein, and low-fat dairy products. Limit your intake of foods with high amounts of sugar, saturated fats, and salt.  Take vitamin and mineral supplements as recommended by your health care provider.  Do not drink alcohol if your health care provider  tells you not to drink.  If you drink alcohol: ? Limit how much you have to 0-2 drinks a day. ? Be aware of how much alcohol is in your drink. In the [place] one drink equals one 12 oz bottle of beer (355 mL), one 5 oz glass of wine (148 mL), or one 1 oz glass of hard liquor (44 mL). Lifestyle  Take daily care of your teeth and gums.  Stay active. Exercise for at least 30 minutes on 5 or more days each week.  Do not use any products that contain nicotine or tobacco, such as cigarettes, e-cigarettes, and chewing tobacco. If you need help quitting, ask your health care provider.  If you are sexually active, practice safe sex. Use a condom or other form of protection to prevent STIs (sexually transmitted infections).  Talk with your health care provider about taking a low-dose aspirin or statin. What's next?  Visit your health care provider once a year for a well check visit.  Ask your health care provider how often you should have your eyes and teeth checked.  Stay up to date on all vaccines. This information is not intended to replace advice given to you by your health care provider. Make sure you discuss any questions you have with your health care provider. Document Released: 06/20/2015 Document Revised: 05/18/2018 Document Reviewed: 05/18/2018 Elsevier Patient Education  2020 Elsevier Inc[employer]  

## 2019-04-06 NOTE — Assessment & Plan Note (Signed)
Tolerating statin, encouraged heart healthy diet, avoid trans fats, minimize simple carbs and saturated fats. Increase exercise as tolerated 

## 2019-04-06 NOTE — Assessment & Plan Note (Signed)
ghm utd Check labs  See avs  

## 2019-04-06 NOTE — Progress Notes (Signed)
Patient ID: [name], male    DOB: 05/27/1932  Age: 85 y.o. MRN: [id number] ° ° ° °Subjective:  °Subjective  °HPI °[name] presents for cpe.  No complaints --- he will see oncology today because nephrology saw scar tissue on the kidney that was seen by them.  ° °Review of Systems  °Constitutional: Negative.  Negative for appetite change, diaphoresis, fatigue and unexpected weight change.  °HENT: Negative for congestion, ear pain, hearing loss, nosebleeds, postnasal drip, rhinorrhea, sinus pressure, sneezing and tinnitus.   °Eyes: Negative for photophobia, pain, discharge, redness, itching and visual disturbance.  °Respiratory: Negative.  Negative for cough, chest tightness, shortness of breath and wheezing.   °Cardiovascular: Negative.  Negative for chest pain, palpitations and leg swelling.  °Gastrointestinal: Negative for abdominal distention, abdominal pain, anal bleeding, blood in stool and constipation.  °Endocrine: Negative.  Negative for cold intolerance, heat intolerance, polydipsia, polyphagia and polyuria.  °Genitourinary: Negative.  Negative for difficulty urinating, dysuria and frequency.  °Musculoskeletal: Negative.   °Skin: Negative.   °Allergic/Immunologic: Negative.   °Neurological: Negative for dizziness, weakness, light-headedness, numbness and headaches.  °Psychiatric/Behavioral: Negative for agitation, confusion, decreased concentration, dysphoric mood, sleep disturbance and suicidal ideas. The patient is not nervous/anxious.   ° ° °History °Past Medical History:  °Diagnosis Date  °• CAD (coronary artery disease)   ° LHC 03/25/11 by Dr. Cooper:  pLAD 99%, oCFX 20-30%, pOM1 40%, dAVCFX 20-30%.  EF was normal on nuclear study.  He was treated with a Promus DES to his pLAD.   °• Colon polyps 1996  ° villous adenoma  °• DM type 2 (diabetes mellitus, type 2) (HCC) 2002  °• Fuchs' corneal dystrophy   °• Hyperlipidemia   °• Hypertension   °• Macular degeneration   ° posterior vitreous vitreous  detachment  °• Prostate cancer (HCC) dx 2018  ° ° °He has a past surgical history that includes cataract (Bilateral, 2010); Coronary stent placement (03/25/2011); corenea implants (june and sept 2018); Cardiac catheterization (03/25/2011); Radioactive seed implant (N/A, 05/06/2017); and SPACE OAR INSTILLATION (N/A, 05/06/2017).  ° °His family history includes Coronary artery disease (age of onset: 54) in his father; Stomach cancer (age of onset: 97) in his mother.He reports that he quit smoking about 60 years ago. He has a 0.75 pack-year smoking history. He has never used smokeless tobacco. He reports current alcohol use. He reports that he does not use drugs. ° °Current Outpatient Medications on File Prior to Visit  °Medication Sig Dispense Refill  °• Alcohol Swabs PADS Use as directed once a day 100 each 1  °• amLODipine (NORVASC) 5 MG tablet Take 1 tablet (5 mg total) by mouth daily. 90 tablet 1  °• aspirin 81 MG tablet Take 81 mg by mouth daily.    °• atorvastatin (LIPITOR) 10 MG tablet TAKE 1 TABLET EVERY DAY 90 tablet 1  °• Blood Glucose Calibration (TRUE METRIX LEVEL 1) Low SOLN Use as directed. 1 each 0  °• Blood Glucose Monitoring Suppl (TRUE METRIX AIR GLUCOSE METER) w/Device KIT 1 each by Does not apply route daily. Use as directed once a day 1 kit 0  °• brimonidine-timolol (COMBIGAN) 0.2-0.5 % ophthalmic solution Place 1 drop into the right eye 2 (two) times daily.     °• docusate sodium (COLACE) 100 MG capsule Take 1 capsule (100 mg total) by mouth every 12 (twelve) hours. 60 capsule 0  °• fish oil-omega-3 fatty acids 1000 MG capsule Take 2 g by mouth daily.      °•   glimepiride (AMARYL) 4 MG tablet TAKE 1 TABLET EVERY DAY WITH BREAKFAST 90 tablet 1   glucose blood (TRUE METRIX BLOOD GLUCOSE TEST) test strip Use as directed once a day.  Dx code: E11.9 100 each 12   HYDROcodone-acetaminophen (NORCO/VICODIN) 5-325 MG tablet Take 1 tablet by mouth every 6 (six) hours as needed for severe pain. 15 tablet  0   losartan-hydrochlorothiazide (HYZAAR) 100-25 MG tablet TAKE 1 TABLET EVERY DAY 90 tablet 1   Multiple Vitamin (MULTIVITAMIN) tablet Take 1 tablet by mouth daily.     prednisoLONE acetate (PRED FORTE) 1 % ophthalmic suspension Place 1 drop into the left eye 4 (four) times daily.      SitaGLIPtin-MetFORMIN HCl (JANUMET XR) [phone] MG TB24 Take 1 tablet by mouth daily. 90 tablet 1   tamsulosin (FLOMAX) 0.4 MG CAPS capsule TAKE 1 CAPSULE (0.4 MG TOTAL) BY MOUTH DAILY. 90 capsule 3   TRUEplus Lancets 30G MISC Use as directed once a day.  E11.9 100 each 1   No current facility-administered medications on file prior to visit.      Objective:  Objective  Physical Exam Vitals signs and nursing note reviewed.  Constitutional:      General: He is sleeping. He is not in acute distress.    Appearance: He is well-developed. He is not diaphoretic.  HENT:     Head: Normocephalic and atraumatic.     Right Ear: External ear normal.     Left Ear: External ear normal.     Nose: Nose normal.     Mouth/Throat:     Pharynx: No oropharyngeal exudate.  Eyes:     General:        Right eye: No discharge.        Left eye: No discharge.     Conjunctiva/sclera: Conjunctivae normal.     Pupils: Pupils are equal, round, and reactive to light.  Neck:     Musculoskeletal: Normal range of motion and neck supple.     Thyroid: No thyromegaly.     Vascular: No JVD.  Cardiovascular:     Rate and Rhythm: Normal rate and regular rhythm.     Heart sounds: No murmur. No friction rub. No gallop.   Pulmonary:     Effort: Pulmonary effort is normal. No respiratory distress.     Breath sounds: Normal breath sounds. No wheezing or rales.  Chest:     Chest wall: No tenderness.  Abdominal:     General: Bowel sounds are normal. There is no distension.     Palpations: Abdomen is soft. There is no mass.     Tenderness: There is no abdominal tenderness. There is no guarding or rebound.  Genitourinary:     Comments: Per urology Musculoskeletal: Normal range of motion.        General: No tenderness.  Lymphadenopathy:     Cervical: No cervical adenopathy.  Skin:    General: Skin is warm and dry.     Coloration: Skin is not pale.     Findings: No erythema or rash.  Neurological:     Mental Status: He is oriented to person, place, and time.     Motor: No abnormal muscle tone.     Deep Tendon Reflexes: Reflexes are normal and symmetric. Reflexes normal.  Psychiatric:        Behavior: Behavior normal.        Thought Content: Thought content normal.        Judgment: Judgment normal.  BP (!) 160/64 (BP Location: Right Arm, Patient Position: Sitting, Cuff Size: Normal)    Pulse 67    Temp (!) 97.2 °F (36.2 °C) (Temporal)    Resp 18    Ht 5' 4" (1.626 m)    Wt 130 lb (59 kg)    SpO2 96%    BMI 22.31 kg/m²  °Wt Readings from Last 3 Encounters:  °04/06/19 130 lb (59 kg)  °03/28/19 128 lb (58.1 kg)  °03/21/19 128 lb (58.1 kg)  ° ° ° °Lab Results  °Component Value Date  ° WBC 11.6 (H) 03/20/2019  ° HGB 12.4 (L) 03/20/2019  ° HCT 38.2 (L) 03/20/2019  ° PLT 239 03/20/2019  ° GLUCOSE 338 (H) 03/20/2019  ° CHOL 135 01/31/2019  ° TRIG 75.0 01/31/2019  ° HDL 47.70 01/31/2019  ° LDLCALC 73 01/31/2019  ° ALT 16 03/20/2019  ° AST 17 03/20/2019  ° NA 138 03/20/2019  ° K 3.6 03/20/2019  ° CL 103 03/20/2019  ° CREATININE 1.45 (H) 03/20/2019  ° BUN 30 (H) 03/20/2019  ° CO2 25 03/20/2019  ° TSH 2.47 10/20/2009  ° PSA 9.32 (H) 08/30/2016  ° INR 1.03 05/02/2017  ° HGBA1C 7.2 (H) 01/31/2019  ° MICROALBUR 14.1 (H) 01/31/2019  ° ° °Dg Wrist 2 Views Left ° °Result Date: 03/28/2019 °CLINICAL DATA:  Left wrist fracture. EXAM: LEFT WRIST - 2 VIEW COMPARISON:  March 20, 2019. FINDINGS: Minimally displaced ulnar styloid fracture is noted. Nondisplaced distal radial metaphyseal fracture is noted which is not significantly changed compared to prior exam. No soft tissue abnormality is noted. IMPRESSION: Stable appearance of ulnar  styloid and distal radial fractures. Electronically Signed   By: James  Green Jr M.D.   On: 03/28/2019 13:30  ° °  °Assessment & Plan:  °Plan  °I am having Lathyn K. Mcraney maintain his fish oil-omega-3 fatty acids, aspirin, multivitamin, brimonidine-timolol, SitaGLIPtin-MetFORMIN HCl, tamsulosin, prednisoLONE acetate, glucose blood, TRUEplus Lancets 30G, Alcohol Swabs, True Metrix Level 1, True Metrix Air Glucose Meter, amLODipine, glimepiride, losartan-hydrochlorothiazide, HYDROcodone-acetaminophen, docusate sodium, and atorvastatin. ° °No orders of the defined types were placed in this encounter. ° ° °Problem List Items Addressed This Visit   °  ° Unprioritized  ° 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, without long-term current use of insulin (HCC)  °  hgba1c to be checked, minimize simple carbs. Increase exercise as tolerated. Continue current meds ° °  °  ° Relevant Orders  ° Hemoglobin A1c  ° Lipid panel  ° Microalbumin / creatinine urine ratio  ° Essential hypertension  °  Well controlled, no changes to meds. Encouraged heart healthy diet such as the DASH diet and exercise as tolerated.  °  °  ° Relevant Orders  ° Lipid panel  ° Comprehensive metabolic panel  ° Hyperlipidemia  °  Tolerating statin, encouraged heart healthy diet, avoid trans fats, minimize simple carbs and saturated fats. Increase exercise as tolerated °  °  ° Hyperlipidemia associated with type 2 diabetes mellitus (HCC)  ° Relevant Orders  ° Lipid panel  ° Preventative health care - Primary  °  ghm utd °Check labs  °See avs °  °  ° Prostate cancer (HCC)  °  Per urology °  °  °  °Other Visit Diagnoses   ° Need for influenza vaccination      ° Relevant Orders  ° Flu Vaccine QUAD High Dose(Fluad)  °  ° ° °Follow-up: Return in about 6 months (around 10/05/2019), or   if symptoms worsen or fail to improve, for hypertension, hyperlipidemia, diabetes II.  [name], DO

## 2019-04-06 NOTE — Assessment & Plan Note (Signed)
Well controlled, no changes to meds. Encouraged heart healthy diet such as the DASH diet and exercise as tolerated.  °

## 2019-04-06 NOTE — Assessment & Plan Note (Signed)
Per u rology 

## 2019-04-06 NOTE — Assessment & Plan Note (Signed)
hgba1c to be checked, minimize simple carbs. Increase exercise as tolerated. Continue current meds  

## 2019-04-07 LAB — BETA 2 MICROGLOBULIN, SERUM: Beta-2 Microglobulin: 3.3 mg/L — ABNORMAL HIGH (ref 0.6–2.4)

## 2019-04-09 ENCOUNTER — Telehealth: Payer: Self-pay | Admitting: Hematology and Oncology

## 2019-04-09 LAB — KAPPA/LAMBDA LIGHT CHAINS
Kappa free light chain: 25 mg/L — ABNORMAL HIGH (ref 3.3–19.4)
Kappa, lambda light chain ratio: 0.05 — ABNORMAL LOW (ref 0.26–1.65)
Lambda free light chains: 528.8 mg/L — ABNORMAL HIGH (ref 5.7–26.3)

## 2019-04-09 NOTE — Telephone Encounter (Signed)
I talk with patient regarding schedule  

## 2019-04-10 ENCOUNTER — Telehealth: Payer: Self-pay | Admitting: Hematology and Oncology

## 2019-04-10 ENCOUNTER — Telehealth: Payer: Self-pay | Admitting: *Deleted

## 2019-04-10 LAB — MULTIPLE MYELOMA PANEL, SERUM
Albumin SerPl Elph-Mcnc: 3.7 g/dL (ref 2.9–4.4)
Albumin/Glob SerPl: 1.3 (ref 0.7–1.7)
Alpha 1: 0.3 g/dL (ref 0.0–0.4)
Alpha2 Glob SerPl Elph-Mcnc: 0.9 g/dL (ref 0.4–1.0)
B-Globulin SerPl Elph-Mcnc: 0.8 g/dL (ref 0.7–1.3)
Gamma Glob SerPl Elph-Mcnc: 1 g/dL (ref 0.4–1.8)
Globulin, Total: 3 g/dL (ref 2.2–3.9)
IgA: 192 mg/dL (ref 61–437)
IgG (Immunoglobin G), Serum: 1002 mg/dL (ref 603–1613)
IgM (Immunoglobulin M), Srm: 58 mg/dL (ref 15–143)
Total Protein ELP: 6.7 g/dL (ref 6.0–8.5)

## 2019-04-10 NOTE — Telephone Encounter (Signed)
Called pt per 11/3 sch message - pt aware of appt date and time

## 2019-04-10 NOTE — Telephone Encounter (Signed)
TCT patient to advise him of time and date of bone survey in Radiology.  Spoke with patient and informed him of that appt on Thursday @ 11am in Radiology @ [facility]. Also informed him that the bone narrow biopsy will likely be next week and his appt with Dr. Lorenso Courier is on 04/27/19. He voiced understanding.

## 2019-04-12 ENCOUNTER — Ambulatory Visit (HOSPITAL_COMMUNITY)
Admission: RE | Admit: 2019-04-12 | Discharge: 2019-04-12 | Disposition: A | Discharge status: Home / Self Care | Payer: Medicare HMO | Source: Ambulatory Visit | Attending: Hematology and Oncology | Admitting: Hematology and Oncology

## 2019-04-12 ENCOUNTER — Other Ambulatory Visit: Payer: Self-pay

## 2019-04-12 DIAGNOSIS — D472 Monoclonal gammopathy: Secondary | ICD-10-CM | POA: Diagnosis not present

## 2019-04-23 ENCOUNTER — Other Ambulatory Visit: Payer: Self-pay

## 2019-04-23 ENCOUNTER — Inpatient Hospital Stay: Payer: Medicare HMO

## 2019-04-23 ENCOUNTER — Inpatient Hospital Stay: Payer: Medicare HMO | Attending: Hematology and Oncology | Admitting: Adult Health

## 2019-04-23 ENCOUNTER — Telehealth: Payer: Self-pay | Admitting: Family Medicine

## 2019-04-23 VITALS — BP 151/72 | HR 56 | Temp 97.9°F | Resp 16

## 2019-04-23 DIAGNOSIS — C9000 Multiple myeloma not having achieved remission: Secondary | ICD-10-CM | POA: Insufficient documentation

## 2019-04-23 DIAGNOSIS — D649 Anemia, unspecified: Secondary | ICD-10-CM | POA: Diagnosis not present

## 2019-04-23 DIAGNOSIS — I10 Essential (primary) hypertension: Secondary | ICD-10-CM | POA: Diagnosis not present

## 2019-04-23 DIAGNOSIS — C61 Malignant neoplasm of prostate: Secondary | ICD-10-CM | POA: Diagnosis not present

## 2019-04-23 DIAGNOSIS — E119 Type 2 diabetes mellitus without complications: Secondary | ICD-10-CM | POA: Insufficient documentation

## 2019-04-23 DIAGNOSIS — D472 Monoclonal gammopathy: Secondary | ICD-10-CM

## 2019-04-23 LAB — CBC WITH DIFFERENTIAL (CANCER CENTER ONLY)
Abs Immature Granulocytes: 0.02 10*3/uL (ref 0.00–0.07)
Basophils Absolute: 0.1 10*3/uL (ref 0.0–0.1)
Basophils Relative: 1 %
Eosinophils Absolute: 0.1 10*3/uL (ref 0.0–0.5)
Eosinophils Relative: 1 %
HCT: 38 % — ABNORMAL LOW (ref 39.0–52.0)
Hemoglobin: 12.4 g/dL — ABNORMAL LOW (ref 13.0–17.0)
Immature Granulocytes: 0 %
Lymphocytes Relative: 21 %
Lymphs Abs: 1.8 10*3/uL (ref 0.7–4.0)
MCH: 30.2 pg (ref 26.0–34.0)
MCHC: 32.6 g/dL (ref 30.0–36.0)
MCV: 92.7 fL (ref 80.0–100.0)
Monocytes Absolute: 0.7 10*3/uL (ref 0.1–1.0)
Monocytes Relative: 9 %
Neutro Abs: 5.9 10*3/uL (ref 1.7–7.7)
Neutrophils Relative %: 68 %
Platelet Count: 221 10*3/uL (ref 150–400)
RBC: 4.1 MIL/uL — ABNORMAL LOW (ref 4.22–5.81)
RDW: 13.8 % (ref 11.5–15.5)
WBC Count: 8.6 10*3/uL (ref 4.0–10.5)
nRBC: 0 % (ref 0.0–0.2)

## 2019-04-23 NOTE — Progress Notes (Signed)
Consent reviewed by Wilber Bihari NP & signed.  BMBX performed by Mendel Ryder.  Pt instructed to rest x 30 minutes.   VSS, no c/o's, Dressing to L Sacrum intact & dry.  Instructed to call for any bleeding that doesn't stop with pressure or any worsening pain. D/C To home.

## 2019-04-23 NOTE — Telephone Encounter (Signed)
Informed of prolia.   Rosemarie Ax, MD [facility] 04/23/2019, 4:50 PM

## 2019-04-23 NOTE — Progress Notes (Signed)
INDICATION: MGUS  Brief examination was performed. ENT: adequate airway clearance Heart: regular rate and rhythm.No Murmurs Lungs: clear to auscultation, no wheezes, normal respiratory effort  Bone Marrow Biopsy and Aspiration Procedure Note   Informed consent was obtained and potential risks including bleeding, infection and pain were reviewed with the patient.  The patient's name, date of birth, identification, consent and allergies were verified prior to the start of procedure and time out was performed.  The left posterior iliac crest was chosen as the site of biopsy.  The skin was prepped with ChloraPrep.   8 cc of 2% lidocaine was used to provide local anaesthesia.   8 cc of bone marrow aspirate was obtained followed by 1cm biopsy.  Pressure was applied to the biopsy site and bandage was placed over the biopsy site. Patient was made to lie on the back for 30 mins prior to discharge.  The procedure was tolerated well. COMPLICATIONS: None BLOOD LOSS: none The patient was discharged home in stable condition with a 1 week follow up to review results.  Patient was provided with post bone marrow biopsy instructions and instructed to call if there was any bleeding or worsening pain.  Specimens sent for flow cytometry, cytogenetics and additional studies.  Signed Scot Dock, NP

## 2019-04-23 NOTE — Patient Instructions (Signed)

## 2019-04-24 LAB — IGG, IGA, IGM
IgA: 170 mg/dL (ref 61–437)
IgG (Immunoglobin G), Serum: 1034 mg/dL (ref 603–1613)
IgM (Immunoglobulin M), Srm: 54 mg/dL (ref 15–143)

## 2019-04-25 ENCOUNTER — Other Ambulatory Visit: Payer: Self-pay

## 2019-04-25 ENCOUNTER — Ambulatory Visit: Payer: Medicare HMO | Admitting: Family Medicine

## 2019-04-25 ENCOUNTER — Encounter: Payer: Self-pay | Admitting: Family Medicine

## 2019-04-25 DIAGNOSIS — M80032D Age-related osteoporosis with current pathological fracture, left forearm, subsequent encounter for fracture with routine healing: Secondary | ICD-10-CM | POA: Diagnosis not present

## 2019-04-25 LAB — UPEP/UIFE/LIGHT CHAINS/TP, 24-HR UR
% BETA, Urine: 7.8 %
ALPHA 1 URINE: 1.1 %
Albumin, U: 46.3 %
Alpha 2, Urine: 2.6 %
Free Kappa Lt Chains,Ur: 54.5 mg/L (ref 0.63–113.79)
Free Kappa/Lambda Ratio: 0.06 — ABNORMAL LOW (ref 1.03–31.76)
Free Lambda Lt Chains,Ur: 912.5 mg/L — ABNORMAL HIGH (ref 0.47–11.77)
GAMMA GLOBULIN URINE: 42.2 %
M-SPIKE %, Urine: 38.1 % — ABNORMAL HIGH
M-Spike, Mg/24 Hr: 171 mg/24 hr — ABNORMAL HIGH
Total Protein, Urine-Ur/day: 449 mg/24 hr — ABNORMAL HIGH (ref 30–150)
Total Protein, Urine: 44.9 mg/dL

## 2019-04-25 LAB — SURGICAL PATHOLOGY

## 2019-04-25 NOTE — Progress Notes (Signed)
[name] - 85 y.o. male MRN [id number]  Date of birth: 1931-12-24  SUBJECTIVE:  Including CC & ROS.  Chief Complaint  Patient presents with  . Follow-up    follow up for rib cage / left wrist    [name] is a 85 y.o. male that is following up for his left wrist fracture.  He feels his pain is minimal in nature.  He does experience some swelling over the radial aspect of the wrist.  Has been wearing his brace intermittently.  He has been taking the vitamin K2 and vitamin D.  Denies any numbness or tingling.  His grip strength is returning..  Independent review of the left wrist x-ray from 10/21 shows a stable appearance of the distal fractures of the radius and ulna styloid.   Review of Systems  Constitutional: Negative for fever.  HENT: Negative for congestion.   Respiratory: Negative for cough.   Cardiovascular: Negative for chest pain.  Gastrointestinal: Negative for abdominal pain.  Musculoskeletal: Positive for arthralgias.  Skin: Negative for color change.  Neurological: Negative for weakness.  Hematological: Negative for adenopathy.    HISTORY: Past Medical, Surgical, Social, and Family History Reviewed & Updated per EMR.   Pertinent Historical Findings include:  Past Medical History:  Diagnosis Date  . CAD (coronary artery disease)    LHC 03/25/11 by Dr. Burt Knack:  pLAD 99%, oCFX 20-30%, pOM1 40%, dAVCFX 20-30%.  EF was normal on nuclear study.  He was treated with a Promus DES to his pLAD.   [name] Colon polyps 1996   villous adenoma  . DM type 2 (diabetes mellitus, type 2) ([place]) 2002  . Fuchs' corneal dystrophy   . Hyperlipidemia   . Hypertension   . Macular degeneration    posterior vitreous vitreous detachment  . Prostate cancer [facility]) dx 2018    Past Surgical History:  Procedure Laterality Date  . CARDIAC CATHETERIZATION  03/25/2011  . cataract Bilateral 2010  . corenea implants  june and sept 2018   [name]  . CORONARY STENT PLACEMENT   03/25/2011  . RADIOACTIVE SEED IMPLANT N/A 05/06/2017   Procedure: RADIOACTIVE SEED IMPLANT/BRACHYTHERAPY IMPLANT;  Surgeon: Kathie Rhodes, MD;  Location: [facility];  Service: Urology;  Laterality: N/A;  . SPACE OAR INSTILLATION N/A 05/06/2017   Procedure: SPACE OAR INSTILLATION;  Surgeon: Kathie Rhodes, MD;  Location: [facility];  Service: Urology;  Laterality: N/A;    No Known Allergies  Family History  Problem Relation Age of Onset  . Coronary artery disease Father 43       deceased  . Stomach cancer Mother 59     Social History   Socioeconomic History  . Marital status: Widowed    Spouse name: Not on file  . Number of children: Not on file  . Years of education: Not on file  . Highest education level: Not on file  Occupational History  . Not on file  Social Needs  . Financial resource strain: Not on file  . Food insecurity    Worry: Not on file    Inability: Not on file  . Transportation needs    Medical: Not on file    Non-medical: Not on file  Tobacco Use  . Smoking status: Former Smoker    Packs/day: 0.25    Years: 3.00    Pack years: 0.75    Quit date: 06/07/1958    Years since quitting: 60.9  . Smokeless tobacco: Never  Used  Substance and Sexual Activity  . Alcohol use: Yes    Comment: 1 glass wine per day  . Drug use: No  . Sexual activity: Not Currently  Lifestyle  . Physical activity    Days per week: Not on file    Minutes per session: Not on file  . Stress: Not on file  Relationships  . Social Herbalist on phone: Not on file    Gets together: Not on file    Attends religious service: Not on file    Active member of club or organization: Not on file    Attends meetings of clubs or organizations: Not on file    Relationship status: Not on file  . Intimate partner violence    Fear of current or ex partner: Not on file    Emotionally abused: Not on file    Physically abused: Not on file    Forced  sexual activity: Not on file  Other Topics Concern  . Not on file  Social History Narrative  . Not on file     PHYSICAL EXAM:  VS: BP (!) 177/75   Pulse 60   Ht 5\' 4"  (1.626 m)   Wt 130 lb (59 kg)   BMI 22.31 kg/m  Physical Exam Gen: NAD, alert, cooperative with exam, well-appearing ENT: normal lips, normal nasal mucosa,  Eye: normal EOM, normal conjunctiva and lids CV:  no edema, +2 pedal pulses   Resp: no accessory muscle use, non-labored,  Skin: no rashes, no areas of induration  Neuro: normal tone, normal sensation to touch Psych:  normal insight, alert and oriented MSK:  Left wrist:  Mild swelling of the distal radius. Normal grip strength. Has good flexion and extension of the wrist. Neurovascularly intact     ASSESSMENT & PLAN:   Osteoporosis with pathological fracture of forearm Initial injury was on 10/7.  Has good range of motion. -Counseled on the brace use. -Counseled on home exercise therapy and supportive care. -Can follow-up in 4 weeks.  Consider imaging at that time.

## 2019-04-25 NOTE — Assessment & Plan Note (Signed)
Initial injury was on 10/7.  Has good range of motion. -Counseled on the brace use. -Counseled on home exercise therapy and supportive care. -Can follow-up in 4 weeks.  Consider imaging at that time.

## 2019-04-25 NOTE — Patient Instructions (Signed)
Good to see you Please use ice if needed  Please continue the range of motion movements  Please use the brace during the day  Please send me a message in MyChart with any questions or updates.  Please see me back in 4 weeks.   --Dr. Raeford Razor

## 2019-04-26 NOTE — Progress Notes (Signed)
[place] [phone]   [phone]  PROGRESS NOTE  Patient Care Team: Carollee Herter, Alferd Apa, DO as PCP - General (Family Medicine) Kathie Rhodes, MD as Consulting Physician (Urology) Tyler Pita, MD as Consulting Physician (Radiation Oncology) Marilynne Halsted, MD as Referring Physician (Ophthalmology)  Hematological/Oncological History #Smoldering Multiple Myeloma, Intermediate Risk 1) 02/15/19: found to have ambda chains 645.5, kappa 27.9 with a ratio of 0.04, no serum M protein during nephrology work up.  2) 04/06/19: establish care with Dr. Lorenso Courier 3) 04/06/19: SPEP shows no serum monoclonal protein, however high levels of M protein detected in the urine. B2Microglobulin at 3.3. Bone Survey negative.  4)04/23/19: Bone Marrow biopsy performed, confirmed 10-15% clonal plasma cells (9% in aspirate). Confirmed diagnosis of Smoldering multiple myeloma, intermediate risk.   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 Gleason Score of 3+4 1) 05/06/17:Insertion of radioactive I-125 seeds into the prostate glandwith placement of SpaceOAR;145Gy, definitive therapy. Radiation oncologist was Dr. Tammi Klippel at [facility] 2) subsequently followed by outside urology with serial PSA measurements. Reported last measurement in Oct 2020 at PSA 0.2.   Interval History:  [name] 85 y.o. male with medical history significant for smoldering multiple myeloma and prostate cancer presents for a follow up visit.   In the interim since his last visit he has had a UPEP results and a BmBx. The UPEP showed increased M protein, and the bone marrow biopsy confirmed the diagnosis of smoldering multiple myeloma. Fortunately his bone survey was negative and he has not developed any CRAB criteria.   On exam today [name] notes he feels well he has been doing his baseline level of running and working in the yard.  He is still eating well and has had no new symptoms in the  interim since his last visit he reports that his bone marrow biopsy went well and that he had very minimal pain during and after the procedure.  He notes no bleeding at the site of the marrow.  He does report that he is looking forward to the senior Olympics in [place] in next November.  Otherwise he had no additional questions or concerns today.  A full 10 point ROS is listed below.  MEDICAL HISTORY:  Past Medical History:  Diagnosis Date  . CAD (coronary artery disease)    LHC 03/25/11 by Dr. Burt Knack:  pLAD 99%, oCFX 20-30%, pOM1 40%, dAVCFX 20-30%.  EF was normal on nuclear study.  He was treated with a Promus DES to his pLAD.   [name] Colon polyps 1996   villous adenoma  . DM type 2 (diabetes mellitus, type 2) ([place]) 2002  . Fuchs' corneal dystrophy   . Hyperlipidemia   . Hypertension   . Macular degeneration    posterior vitreous vitreous detachment  . Prostate cancer [facility]) dx 2018    SURGICAL HISTORY: Past Surgical History:  Procedure Laterality Date  . CARDIAC CATHETERIZATION  03/25/2011  . cataract Bilateral 2010  . corenea implants  june and sept 2018   [name]  . CORONARY STENT PLACEMENT  03/25/2011  . RADIOACTIVE SEED IMPLANT N/A 05/06/2017   Procedure: RADIOACTIVE SEED IMPLANT/BRACHYTHERAPY IMPLANT;  Surgeon: Kathie Rhodes, MD;  Location: [place];  Service: Urology;  Laterality: N/A;  . SPACE OAR INSTILLATION N/A 05/06/2017   Procedure: SPACE OAR INSTILLATION;  Surgeon: Kathie Rhodes, MD;  Location: [facility];  Service: Urology;  Laterality: N/A;    SOCIAL HISTORY: Social History   Socioeconomic History  .  Marital status: Widowed    Spouse name: Not on file  . Number of children: Not on file  . Years of education: Not on file  . Highest education level: Not on file  Occupational History  . Not on file  Social Needs  . Financial resource strain: Not on file  . Food insecurity    Worry: Not on file    Inability:  Not on file  . Transportation needs    Medical: Not on file    Non-medical: Not on file  Tobacco Use  . Smoking status: Former Smoker    Packs/day: 0.25    Years: 3.00    Pack years: 0.75    Quit date: 06/07/1958    Years since quitting: 60.9  . Smokeless tobacco: Never Used  Substance and Sexual Activity  . Alcohol use: Yes    Comment: 1 glass wine per day  . Drug use: No  . Sexual activity: Not Currently  Lifestyle  . Physical activity    Days per week: Not on file    Minutes per session: Not on file  . Stress: Not on file  Relationships  . Social Herbalist on phone: Not on file    Gets together: Not on file    Attends religious service: Not on file    Active member of club or organization: Not on file    Attends meetings of clubs or organizations: Not on file    Relationship status: Not on file  . Intimate partner violence    Fear of current or ex partner: Not on file    Emotionally abused: Not on file    Physically abused: Not on file    Forced sexual activity: Not on file  Other Topics Concern  . Not on file  Social History Narrative  . Not on file    FAMILY HISTORY: Family History  Problem Relation Age of Onset  . Coronary artery disease Father 79       deceased  . Stomach cancer Mother 73    ALLERGIES:  has No Known Allergies.  MEDICATIONS:  Current Outpatient Medications  Medication Sig Dispense Refill  . Alcohol Swabs PADS Use as directed once a day 100 each 1  . amLODipine (NORVASC) 5 MG tablet Take 1 tablet (5 mg total) by mouth daily. 90 tablet 1  . aspirin 81 MG tablet Take 81 mg by mouth daily.    [name] atorvastatin (LIPITOR) 10 MG tablet TAKE 1 TABLET EVERY DAY 90 tablet 1  . Blood Glucose Calibration (TRUE METRIX LEVEL 1) Low SOLN Use as directed. 1 each 0  . Blood Glucose Monitoring Suppl (TRUE METRIX AIR GLUCOSE METER) w/Device KIT 1 each by Does not apply route daily. Use as directed once a day 1 kit 0  . brimonidine-timolol  (COMBIGAN) 0.2-0.5 % ophthalmic solution Place 1 drop into the right eye 2 (two) times daily.     [name] docusate sodium (COLACE) 100 MG capsule Take 1 capsule (100 mg total) by mouth every 12 (twelve) hours. 60 capsule 0  . fish oil-omega-3 fatty acids 1000 MG capsule Take 2 g by mouth daily.      [name] glimepiride (AMARYL) 4 MG tablet TAKE 1 TABLET EVERY DAY WITH BREAKFAST 90 tablet 1  . glucose blood (TRUE METRIX BLOOD GLUCOSE TEST) test strip Use as directed once a day.  Dx code: E11.9 100 each 12  . losartan-hydrochlorothiazide (HYZAAR) 100-25 MG tablet TAKE 1 TABLET EVERY DAY 90  tablet 1  . Multiple Vitamin (MULTIVITAMIN) tablet Take 1 tablet by mouth daily.    . SitaGLIPtin-MetFORMIN HCl (JANUMET XR) [phone] MG TB24 Take 1 tablet by mouth daily. 90 tablet 1  . tamsulosin (FLOMAX) 0.4 MG CAPS capsule TAKE 1 CAPSULE (0.4 MG TOTAL) BY MOUTH DAILY. 90 capsule 3  . TRUEplus Lancets 30G MISC Use as directed once a day.  E11.9 100 each 1   No current facility-administered medications for this visit.     REVIEW OF SYSTEMS:   Constitutional: ( - ) fevers, ( - )  chills , ( - ) night sweats Eyes: ( - ) blurriness of vision, ( - ) double vision, ( - ) watery eyes Ears, nose, mouth, throat, and face: ( - ) mucositis, ( - ) sore throat Respiratory: ( - ) cough, ( - ) dyspnea, ( - ) wheezes Cardiovascular: ( - ) palpitation, ( - ) chest discomfort, ( - ) lower extremity swelling Gastrointestinal:  ( - ) nausea, ( - ) heartburn, ( - ) change in bowel habits Skin: ( - ) abnormal skin rashes Lymphatics: ( - ) new lymphadenopathy, ( - ) easy bruising Neurological: ( - ) numbness, ( - ) tingling, ( - ) new weaknesses Behavioral/Psych: ( - ) mood change, ( - ) new changes  All other systems were reviewed with the patient and are negative.  PHYSICAL EXAMINATION: ECOG PERFORMANCE STATUS: 0 - Asymptomatic  Vitals:   04/27/19 1056  BP: 138/65  Pulse: 70  Resp: 17  Temp: 98 F (36.7 C)  SpO2: 99%    Filed Weights   04/27/19 1056  Weight: 131 lb 12.8 oz (59.8 kg)    GENERAL: well appearing elderly Caucasian male, alert, no distress and comfortable SKIN: skin color, texture, turgor are normal, no rashes or significant lesions EYES: conjunctiva are pink and non-injected, sclera clear LUNGS: clear to auscultation and percussion with normal breathing effort HEART: regular rate & rhythm and no murmurs and no lower extremity edema ABDOMEN: soft, non-tender, non-distended, normal bowel sounds Musculoskeletal: no cyanosis of digits and no clubbing  PSYCH: alert & oriented x 3, fluent speech NEURO: no focal motor/sensory deficits  LABORATORY DATA:  I have reviewed the data as listed  CMP Latest Ref Rng & Units 04/06/2019 04/06/2019 03/20/2019  Glucose 70 - 99 mg/dL 183(H) 143(H) 338(H)  BUN 8 - 23 mg/dL 26(H) 23 30(H)  Creatinine 0.61 - 1.24 mg/dL 1.38(H) 1.23 1.45(H)  Sodium 135 - 145 mmol/L 145 144 138  Potassium 3.5 - 5.1 mmol/L 4.0 4.4 3.6  Chloride 98 - 111 mmol/L 105 104 103  CO2 22 - 32 mmol/L 29 33(H) 25  Calcium 8.9 - 10.3 mg/dL 9.5 9.5 9.1  Total Protein 6.5 - 8.1 g/dL 7.5 6.5 6.9  Total Bilirubin 0.3 - 1.2 mg/dL 0.4 0.5 0.9  Alkaline Phos 38 - 126 U/L 211(H) 182(H) 76  AST 15 - 41 U/L 14(L) 14 17  ALT 0 - 44 U/L _0 CBC Latest Ref Rng & Units 04/23/2019 04/06/2019 03/20/2019  WBC 4.0 - 10.5 K/uL 8.6 10.5 11.6(H)  Hemoglobin 13.0 - 17.0 g/dL 12.4(L) 12.6(L) 12.4(L)  Hematocrit 39.0 - 52.0 % 38.0(L) 36.9(L) 38.2(L)  Platelets 150 - 400 K/uL 221 319 239     Surgical Pathology  CASE: [id number]  PATIENT: Jonatha Sonnenberg  Bone Marrow Report   Clinical History: MGUS, left posterior iliac crest, (ADC)   DIAGNOSIS:   BONE MARROW, ASPIRATE, CLOT, CORE:  -  Plasma cell myeloma, see comment.  - No amyloid deposits.  - Minimal iron stores.   PERIPHERAL BLOOD:  - Normocytic anemia.   COMMENT:   The marrow is mildly hypercellular with increased monotypic plasma  cells  (9% aspirate, 10-15% CD138 immunohistochemistry). There are no amyloid  deposits seen with Congo red, although there is a lack of larger vessels  in the core biopsy. The findings are consistent with plasma cell  myeloma.   MICROSCOPIC DESCRIPTION:   PERIPHERAL BLOOD SMEAR: There is a normocytic anemia with scattered  elliptocytes/ovalocytes. There is no rouleaux formation. Leukocytes  are present in normal numbers. Circulating plasma cells are not  identified. Platelets are present in normal numbers.   BONE MARROW ASPIRATE: Spicular cellular.  Erythroid precursors:Present in appropriate proportions. No  significant dysplasia.  Granulocytic precursors: Present in appropriate proportions. No  significant dysplasia. No increase in blasts.  Megakaryocytes: Present and largely unremarkable.  Lymphocytes/plasma cells: There is a mild increase in plasma cells (9%  by manual differential counts) with scattered atypical forms (large  forms, binucleation). Lymphocytes are not increased.   TOUCH PREPARATIONS: Similar to aspirate smears.   CLOT AND BIOPSY: The core biopsies and clot sections are mildly  hypercellular for age (84 to 30%). CD138 immunohistochemistry reveals  increased plasma cells (10 to 15%) scattered in small clusters. By  light chain in situ hybridization plasma cells are lambda restricted.  Myeloid and erythroid elements are present in appropriate proportions.  Megakaryocytes exhibit a spectrum of maturation without tight clusters.  There are few benign appearing lymphoid aggregates. Congo red is  negative for amyloid deposits.   IRON STAIN: Iron stains are performed on a bone marrow aspirate or touch  imprint smear and section of clot. The controls stained appropriately.     Storage Iron: Minimal.    Ring Sideroblasts: Absent.   ADDITIONAL DATA/TESTING: Cytogenetics, including FISH for myeloma, was  ordered and will be reported separately.    CELL COUNT DATA:   Bone Marrow count performed on 500 cells shows:  Blasts:  0%  Myeloid: 47%  Promyelocytes: 0%  Erythroid:   23%  Myelocytes:  5%  Lymphocytes:  17%  Metamyelocytes:   0%  Plasma cells: 9%  Bands:  16%  Neutrophils:  22% M:E ratio:   2.04  Eosinophils:  4%  Basophils:   0%  Monocytes:   4%   Lab Data: CBC performed on 04/23/2019 shows:  WBC: 8.6 k/uL Neutrophils:  71%  Hgb: 12.4 g/dL Lymphocytes:  25%  HCT: 38.0 %  Monocytes:   3%  MCV: 92.7 fL  Eosinophils:  0%  RDW: 13.8 %  Basophils:   1%  PLT: 221 k/uL   GROSS DESCRIPTION:   A: Aspirate smear   B: The specimen is received in B-plus fixative and consists of a 21.0 x  12.0 x 5.0 mm aggregate of red-brown clotted blood. The specimen is  entirely submitted in cassette B.   C: The specimen is received in B-plus fixative and consists of 2 cores  of tan bone, measuring 0.6 and 0.9 cm in length by 0.2 cm in diameter.  The specimen is entirely submitted in cassette C. Craig Staggers 04/25/2019)   Final Diagnosis performed by Vicente Males, MD.  Electronically signed  04/25/2019  Technical and / or Professional components performed at [facility], [place] [address] [place], [place] [address].  Immunohistochemistry Technical component (if applicable) was performed  at [facility]. [address]  [place],  [place], [place] [address].  IMMUNOHISTOCHEMISTRY DISCLAIMER (if applicable):  Some of these immunohistochemical stains may have been developed and the  performance characteristics determine by [facility]. Some  may not have been cleared or approved by the U.S. Food and Drug  Administration. The FDA has determined that such clearance or approval  is not necessary. This test is used for clinical purposes. It should not  be regarded as investigational or for research. This laboratory is  certified under the [place]  (CLIA-88) as qualified to perform high complexity clinical laboratory  testing. The controls stained appropriately.   RADIOGRAPHIC STUDIES: No relevant radiographic studies.   ASSESSMENT & PLAN MYER BOHLMAN 85 y.o. male with medical history significant for CAD s/p PCI in 2012, DM type II, Prostate cancer s/p brachytherapy, and HTN who presents for follow up for his smoldering multiple myeloma.   Treatment of smoldering multiple myeloma is a new and developing area of hematology.  There have been clinical trials showing benefit in the treatment of intermediate to high risk SMM (J Clin Oncol. 2020 Apr 10;38(11):1126-1137).  The current treatment recommendation consists of lenalidomide 25 mg daily for 21 of 28 days.  This therapy is however best suited for younger patients with longer life expectancy is given the long time to progression the average smoldering multiple myeloma patient has.  Review of the literature shows that intermediate risk patients have a time to progression of approximately 5 years (Blood Cancer Journal 8, 59 (2018).  Given Mr. Otten advanced age I would recommend close monitoring of his hematological parameters and creatinine/calcium to assure that his disease is not progressing.  I have shared with Mr. Paule these statistics and recommendations.   After detailed discussion Mr. Heinzman was in agreement with continued monitoring of his smoldering multiple myeloma.  He acknowledges since he feels well that for chemotherapy would likely inhibit his physical activity and his overall wellbeing.  I am in strong agreement that holding on treatment at this time is a very reasonable option.  #Smoldering Multiple Myeloma, Intermediate Risk --confirmed diagnosis based on the bone marrow biopsy, with plasma cells of 10-15% and no CRAB criteria. No evidence of amyloidosis on bone marrow stain.  --treatment of Smoldering multiple myeloma is a new idea and  predominately consists of monotherapy lenalidomide. Using the [facility] 2018 20/20/20 guidelines, Mr. Cuevas is a borderline Intermediate Risk SMM.  --given his advance aged and lack of any CRAB criteria, I would recommend holding on treatment at this time with close clinical monitoring for progression.  --Mr. Capriotti was in agreement with close continued monitoring.  --RTC in 3 months time.   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 Gleason Score of 3+4 --Mr. Spinella is s/p definitive radioactive seed placement in Nov 2018 --continue to f/u with outside urology group for routine PSA monitoring, though if we are following Mr. Edmondson for a monoclonal gammopathy this is something we can monitor as well.  --last PSA collected earlier this month at [facility], found to be PSA 0.2.   All questions were answered. The patient knows to call the clinic with any problems, questions or concerns.  A total of more than 25 minutes were spent face-to-face with the patient during this encounter and over half of that time was spent on counseling and coordination of care as outlined above.   Ledell Peoples, MD Department of Hematology/Oncology [place] at [facility] Phone: [phone] Pager: [phone]  Email: [email or web address]  04/27/2019 12:11 PM  Literature Support:  Rene Paci, Ron Parker AM, Buadi FK, Wylie Hail, Matous JV, Anderson DM, Emmons RV, Mahindra A, Wagner LI, Dhodapkar MV, Rajkumar SV. Randomized Trial of Lenalidomide Versus Observation in Smoldering Multiple Myeloma. J Clin Oncol. 2020 Apr 10;38(11):1126-1137. doi: 10.1200/JCO.19.01740. Epub 2019 Oct 25. PMID: 34356861; PMCID: UOH7290211. --Progression-free survival was significantly longer with lenalidomide compared with observation (hazard ratio, 0.28; 95% CI, 0.12 to 0.62; P = .002). One-, 2-, and 3-year progression-free survival was 98%, 93%, and  91% for the lenalidomide arm versus 89%, 76%, and 66% for the observation arm, respectively.  Lakshman, A., Rajkumar, S.V., Buadi, F.K. et al. Risk stratification of smoldering multiple myeloma incorporating revised IMWG diagnostic criteria. Blood Cancer Journal 8, 59 (2018). [email or web address] --The median TTP for low-, intermediate-, and high-risk groups were 110, 68, and 29 months, respectively (p?<?0.0001). BMPC%?>?20%, M-protein?>?2?g/dL, and FLCr?>?20 at diagnosis can be used to risk stratify patients with SMM. Patients with high-risk SMM need close follow-up and are candidates for clinical trials aiming to prevent progression.

## 2019-04-27 ENCOUNTER — Inpatient Hospital Stay: Payer: Medicare HMO | Admitting: Hematology and Oncology

## 2019-04-27 ENCOUNTER — Other Ambulatory Visit: Payer: Self-pay

## 2019-04-27 VITALS — BP 138/65 | HR 70 | Temp 98.0°F | Resp 17 | Ht 64.0 in | Wt 131.8 lb

## 2019-04-27 DIAGNOSIS — D472 Monoclonal gammopathy: Secondary | ICD-10-CM

## 2019-04-27 DIAGNOSIS — C9000 Multiple myeloma not having achieved remission: Secondary | ICD-10-CM

## 2019-04-27 DIAGNOSIS — C61 Malignant neoplasm of prostate: Secondary | ICD-10-CM

## 2019-04-30 ENCOUNTER — Encounter (HOSPITAL_COMMUNITY): Payer: Self-pay | Admitting: Hematology and Oncology

## 2019-05-04 ENCOUNTER — Encounter (HOSPITAL_COMMUNITY): Payer: Self-pay | Admitting: Hematology and Oncology

## 2019-05-23 ENCOUNTER — Ambulatory Visit: Payer: Medicare HMO | Admitting: Family Medicine

## 2019-05-23 ENCOUNTER — Other Ambulatory Visit: Payer: Self-pay

## 2019-05-23 ENCOUNTER — Encounter: Payer: Self-pay | Admitting: Family Medicine

## 2019-05-23 ENCOUNTER — Telehealth: Payer: Self-pay | Admitting: Family Medicine

## 2019-05-23 ENCOUNTER — Other Ambulatory Visit: Payer: Self-pay | Admitting: Family Medicine

## 2019-05-23 ENCOUNTER — Ambulatory Visit (HOSPITAL_BASED_OUTPATIENT_CLINIC_OR_DEPARTMENT_OTHER)
Admission: RE | Admit: 2019-05-23 | Discharge: 2019-05-23 | Disposition: A | Discharge status: Home / Self Care | Payer: Medicare HMO | Source: Ambulatory Visit | Attending: Family Medicine | Admitting: Family Medicine

## 2019-05-23 VITALS — HR 65 | Ht 64.0 in | Wt 130.0 lb

## 2019-05-23 DIAGNOSIS — M80032D Age-related osteoporosis with current pathological fracture, left forearm, subsequent encounter for fracture with routine healing: Secondary | ICD-10-CM

## 2019-05-23 DIAGNOSIS — I10 Essential (primary) hypertension: Secondary | ICD-10-CM

## 2019-05-23 DIAGNOSIS — S52692D Other fracture of lower end of left ulna, subsequent encounter for closed fracture with routine healing: Secondary | ICD-10-CM | POA: Diagnosis not present

## 2019-05-23 DIAGNOSIS — S52592D Other fractures of lower end of left radius, subsequent encounter for closed fracture with routine healing: Secondary | ICD-10-CM | POA: Diagnosis not present

## 2019-05-23 NOTE — Patient Instructions (Signed)
Good to see you Please continue the range of motion movements.  Please use ice as needed Please send me a message in MyChart with any questions or updates.  Please see [place] back as needed.   --Dr. Raeford Razor

## 2019-05-23 NOTE — Assessment & Plan Note (Signed)
Injury occurred on 10/7.  Swelling is improved and has good range of motion. -X-ray. -Can discontinue the splint. -Counseled on supportive care. -Can follow-up as needed at this point.

## 2019-05-23 NOTE — Telephone Encounter (Signed)
Informed of xray results.   Rosemarie Ax, MD [facility] 05/23/2019, 2:26 PM

## 2019-05-23 NOTE — Progress Notes (Signed)
[name] - 85 y.o. male MRN [id number]  Date of birth: 10-25-1931  SUBJECTIVE:  Including CC & ROS.  Chief Complaint  Patient presents with  . Follow-up    follow up for left wrist and ribs    [name] is a 85 y.o. male that is following up for his distal radius fracture.  Is doing well with improvement of the swelling.  Is working on his range of motion.  There is just 1 spot over the distal radius that is tender to touch.  Has been getting back to his regular exercise.  Has been wearing the Velcro splint.   Review of Systems  Constitutional: Negative for fever.  HENT: Negative for congestion.   Respiratory: Negative for cough.   Cardiovascular: Negative for chest pain.  Gastrointestinal: Negative for abdominal pain.  Musculoskeletal: Positive for arthralgias.  Skin: Negative for color change.  Neurological: Negative for weakness.  Hematological: Negative for adenopathy.    HISTORY: Past Medical, Surgical, Social, and Family History Reviewed & Updated per EMR.   Pertinent Historical Findings include:  Past Medical History:  Diagnosis Date  . CAD (coronary artery disease)    LHC 03/25/11 by Dr. Burt Knack:  pLAD 99%, oCFX 20-30%, pOM1 40%, dAVCFX 20-30%.  EF was normal on nuclear study.  He was treated with a Promus DES to his pLAD.   [name] Colon polyps 1996   villous adenoma  . DM type 2 (diabetes mellitus, type 2) ([place]) 2002  . Fuchs' corneal dystrophy   . Hyperlipidemia   . Hypertension   . Macular degeneration    posterior vitreous vitreous detachment  . Prostate cancer [facility]) dx 2018    Past Surgical History:  Procedure Laterality Date  . CARDIAC CATHETERIZATION  03/25/2011  . cataract Bilateral 2010  . corenea implants  june and sept 2018   [name]  . CORONARY STENT PLACEMENT  03/25/2011  . RADIOACTIVE SEED IMPLANT N/A 05/06/2017   Procedure: RADIOACTIVE SEED IMPLANT/BRACHYTHERAPY IMPLANT;  Surgeon: Kathie Rhodes, MD;  Location: [place];  Service: Urology;  Laterality: N/A;  . SPACE OAR INSTILLATION N/A 05/06/2017   Procedure: SPACE OAR INSTILLATION;  Surgeon: Kathie Rhodes, MD;  Location: [facility];  Service: Urology;  Laterality: N/A;    No Known Allergies  Family History  Problem Relation Age of Onset  . Coronary artery disease Father 23       deceased  . Stomach cancer Mother 47     Social History   Socioeconomic History  . Marital status: Widowed    Spouse name: Not on file  . Number of children: Not on file  . Years of education: Not on file  . Highest education level: Not on file  Occupational History  . Not on file  Tobacco Use  . Smoking status: Former Smoker    Packs/day: 0.25    Years: 3.00    Pack years: 0.75    Quit date: 06/07/1958    Years since quitting: 61.0  . Smokeless tobacco: Never Used  Substance and Sexual Activity  . Alcohol use: Yes    Comment: 1 glass wine per day  . Drug use: No  . Sexual activity: Not Currently  Other Topics Concern  . Not on file  Social History Narrative  . Not on file   Social Determinants of Health   Financial Resource Strain:   . Difficulty of Paying Living Expenses: Not on file  Food Insecurity:   .  Worried About Charity fundraiser in the Last Year: Not on file  . Ran Out of Food in the Last Year: Not on file  Transportation Needs:   . Lack of Transportation (Medical): Not on file  . Lack of Transportation (Non-Medical): Not on file  Physical Activity:   . Days of Exercise per Week: Not on file  . Minutes of Exercise per Session: Not on file  Stress:   . Feeling of Stress : Not on file  Social Connections:   . Frequency of Communication with Friends and Family: Not on file  . Frequency of Social Gatherings with Friends and Family: Not on file  . Attends Religious Services: Not on file  . Active Member of Clubs or Organizations: Not on file  . Attends Archivist Meetings: Not on file  . Marital Status:  Not on file  Intimate Partner Violence:   . Fear of Current or Ex-Partner: Not on file  . Emotionally Abused: Not on file  . Physically Abused: Not on file  . Sexually Abused: Not on file     PHYSICAL EXAM:  VS: Pulse 65   Ht 5\' 4"  (1.626 m)   Wt 130 lb (59 kg)   BMI 22.31 kg/m  Physical Exam Gen: NAD, alert, cooperative with exam, well-appearing ENT: normal lips, normal nasal mucosa,  Eye: normal EOM, normal conjunctiva and lids CV:  no edema, +2 pedal pulses   Resp: no accessory muscle use, non-labored,  Skin: no rashes, no areas of induration  Neuro: normal tone, normal sensation to touch Psych:  normal insight, alert and oriented MSK:  Left wrist: Tenderness to palpation over the distal radius in 1 spot. Normal range of motion. Normal grip strength. Normal thumb range of motion. Neurovascularly intact     ASSESSMENT & PLAN:   Osteoporosis with pathological fracture of forearm Injury occurred on 10/7.  Swelling is improved and has good range of motion. -X-ray. -Can discontinue the splint. -Counseled on supportive care. -Can follow-up as needed at this point.

## 2019-05-24 NOTE — Telephone Encounter (Signed)
Last OV 04/06/19 Last refill 11/15/18 # 90/1 Next OV 10/05/19

## 2019-06-05 ENCOUNTER — Telehealth: Payer: Self-pay | Admitting: Hematology and Oncology

## 2019-06-05 NOTE — Telephone Encounter (Signed)
Scheduled per 11/20 los. Called and left VM. Mailing printout

## 2019-06-27 ENCOUNTER — Other Ambulatory Visit: Payer: Self-pay | Admitting: Family Medicine

## 2019-06-27 DIAGNOSIS — I10 Essential (primary) hypertension: Secondary | ICD-10-CM

## 2019-07-16 DIAGNOSIS — H401121 Primary open-angle glaucoma, left eye, mild stage: Secondary | ICD-10-CM | POA: Diagnosis not present

## 2019-07-16 DIAGNOSIS — H401112 Primary open-angle glaucoma, right eye, moderate stage: Secondary | ICD-10-CM | POA: Diagnosis not present

## 2019-07-27 ENCOUNTER — Telehealth: Payer: Self-pay | Admitting: *Deleted

## 2019-07-27 NOTE — Telephone Encounter (Signed)
TCT patient to advise that he has a 7:45 am lab appt prior to seeing Dr. Lorenso Courier @ 8:30am.  No answer but was able to leave vm message for pt regarding his appts.

## 2019-07-29 ENCOUNTER — Other Ambulatory Visit: Payer: Self-pay | Admitting: Hematology and Oncology

## 2019-07-29 DIAGNOSIS — C9000 Multiple myeloma not having achieved remission: Secondary | ICD-10-CM

## 2019-07-29 DIAGNOSIS — D472 Monoclonal gammopathy: Secondary | ICD-10-CM

## 2019-07-29 NOTE — Progress Notes (Signed)
[place] [phone]   [phone]  PROGRESS NOTE  Patient Care Team: Carollee Herter, Alferd Apa, DO as PCP - General (Family Medicine) Kathie Rhodes, MD as Consulting Physician (Urology) Tyler Pita, MD as Consulting Physician (Radiation Oncology) Marilynne Halsted, MD as Referring Physician (Ophthalmology)  Hematological/Oncological History #Smoldering Multiple Myeloma, Intermediate Risk 1) 02/15/19: found to have ambda chains 645.5, kappa 27.9 with a ratio of 0.04, no serum M protein during nephrology work up.  2) 04/06/19: establish care with Dr. Lorenso Courier 3) 04/06/19: SPEP shows no serum monoclonal protein, however high levels of M protein detected in the urine. B2Microglobulin at 3.3. Bone Survey negative.  4)04/23/19: Bone Marrow biopsy performed, confirmed 10-15% clonal plasma cells (9% in aspirate). Confirmed diagnosis of Smoldering multiple myeloma, intermediate risk. 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 Gleason Score of 3+4 1) 05/06/17:Insertion of radioactive I-125 seeds into the prostate glandwith placement of SpaceOAR;145Gy, definitive therapy. Radiation oncologist was Dr. Tammi Klippel at [facility] 2) Subsequently followed by outside urology with serial PSA measurements. Reported last measurement in Oct 2020 at PSA 0.2.   Interval History:  [name] 86 y.o. male with medical history significant for smoldering multiple myeloma and prostate cancer presents for a follow up visit. He was last seen on 04/27/2019 to discuss the results of his bone marrow biopsy. In the interim since his last visit [name] has had no changes in medications, no hospitalizations or emergency room visits.  On exam today [name] notes he is doing great.  He reports that he has been running approximately half mile per day around the neighborhood.  He notes that his appetite is great and that he continues to eat out at restaurants nightly.  He  reports that his blood pressure is running a little bit high this morning as he normally takes his blood pressure medications around lunchtime.  He reports that he continues to eat a well-balanced diet including lamb, but not too much red meat.  He does prefer fish and salmon when possible.  Additionally of note he reports that he is taking collagen powder which has been darkening the color of his hair.  On further discussion he denies any bleeding, bruising, dark stools, shortness of breath, fatigue, lightheadedness, dizziness or new bone or back pain.  A full 10 point ROS is listed below.  MEDICAL HISTORY:  Past Medical History:  Diagnosis Date  . CAD (coronary artery disease)    [facility]HC 03/25/11 by Dr. Burt Knack:  pLAD 99%, oCFX 20-30%, pOM1 40%, dAVCFX 20-30%.  EF was normal on nuclear study.  He was treated with a Promus DES to his pLAD.   [name] Colon polyps 1996   villous adenoma  . DM type 2 (diabetes mellitus, type 2) ([place]) 2002  . Fuchs' corneal dystrophy   . Hyperlipidemia   . Hypertension   . Macular degeneration    posterior vitreous vitreous detachment  . Prostate cancer [facility]) dx 2018    SURGICAL HISTORY: Past Surgical History:  Procedure Laterality Date  . CARDIAC CATHETERIZATION  03/25/2011  . cataract Bilateral 2010  . corenea implants  june and sept 2018   [name]  . CORONARY STENT PLACEMENT  03/25/2011  . RADIOACTIVE SEED IMPLANT N/A 05/06/2017   Procedure: RADIOACTIVE SEED IMPLANT/BRACHYTHERAPY IMPLANT;  Surgeon: Kathie Rhodes, MD;  Location: [place];  Service: Urology;  Laterality: N/A;  . SPACE OAR INSTILLATION N/A 05/06/2017   Procedure: SPACE OAR INSTILLATION;  Surgeon:  Kathie Rhodes, MD;  Location: [facility];  Service: Urology;  Laterality: N/A;    SOCIAL HISTORY: Social History   Socioeconomic History  . Marital status: Widowed    Spouse name: Not on file  . Number of children: Not on file  . Years of education: Not  on file  . Highest education level: Not on file  Occupational History  . Not on file  Tobacco Use  . Smoking status: Former Smoker    Packs/day: 0.25    Years: 3.00    Pack years: 0.75    Quit date: 06/07/1958    Years since quitting: 61.1  . Smokeless tobacco: Never Used  Substance and Sexual Activity  . Alcohol use: Yes    Comment: 1 glass wine per day  . Drug use: No  . Sexual activity: Not Currently  Other Topics Concern  . Not on file  Social History Narrative  . Not on file   Social Determinants of Health   Financial Resource Strain:   . Difficulty of Paying Living Expenses: Not on file  Food Insecurity:   . Worried About Charity fundraiser in the Last Year: Not on file  . Ran Out of Food in the Last Year: Not on file  Transportation Needs:   . Lack of Transportation (Medical): Not on file  . Lack of Transportation (Non-Medical): Not on file  Physical Activity:   . Days of Exercise per Week: Not on file  . Minutes of Exercise per Session: Not on file  Stress:   . Feeling of Stress : Not on file  Social Connections:   . Frequency of Communication with Friends and Family: Not on file  . Frequency of Social Gatherings with Friends and Family: Not on file  . Attends Religious Services: Not on file  . Active Member of Clubs or Organizations: Not on file  . Attends Archivist Meetings: Not on file  . Marital Status: Not on file  Intimate Partner Violence:   . Fear of Current or Ex-Partner: Not on file  . Emotionally Abused: Not on file  . Physically Abused: Not on file  . Sexually Abused: Not on file    FAMILY HISTORY: Family History  Problem Relation Age of Onset  . Coronary artery disease Father 23       deceased  . Stomach cancer Mother 68    ALLERGIES:  has No Known Allergies.  MEDICATIONS:  Current Outpatient Medications  Medication Sig Dispense Refill  . Alcohol Swabs PADS Use as directed once a day 100 each 1  . amLODipine (NORVASC) 5  MG tablet TAKE 1 TABLET EVERY DAY 90 tablet 1  . aspirin 81 MG tablet Take 81 mg by mouth daily.    [name] atorvastatin (LIPITOR) 10 MG tablet TAKE 1 TABLET EVERY DAY 90 tablet 1  . Blood Glucose Calibration (TRUE METRIX LEVEL 1) Low SOLN Use as directed. 1 each 0  . Blood Glucose Monitoring Suppl (TRUE METRIX AIR GLUCOSE METER) w/Device KIT 1 each by Does not apply route daily. Use as directed once a day 1 kit 0  . brimonidine-timolol (COMBIGAN) 0.2-0.5 % ophthalmic solution Place 1 drop into the right eye 2 (two) times daily.     . brimonidine-timolol (COMBIGAN) 0.2-0.5 % ophthalmic solution Apply 0.5 drops to eye 2 (two) times daily.    . fish oil-omega-3 fatty acids 1000 MG capsule Take 2 g by mouth daily.      [name] glimepiride (AMARYL)  4 MG tablet TAKE 1 TABLET EVERY DAY WITH BREAKFAST 90 tablet 1  . glucose blood (TRUE METRIX BLOOD GLUCOSE TEST) test strip Use as directed once a day.  Dx code: E11.9 100 each 12  . losartan-hydrochlorothiazide (HYZAAR) 100-25 MG tablet TAKE 1 TABLET EVERY DAY 90 tablet 1  . Multiple Vitamin (MULTIVITAMIN) tablet Take 1 tablet by mouth daily.    . SitaGLIPtin-MetFORMIN HCl (JANUMET XR) [phone] MG TB24 Take 1 tablet by mouth daily. 90 tablet 1  . tamsulosin (FLOMAX) 0.4 MG CAPS capsule TAKE 1 CAPSULE (0.4 MG TOTAL) BY MOUTH DAILY. 90 capsule 3  . TRUEplus Lancets 30G MISC Use as directed once a day.  E11.9 100 each 1   No current facility-administered medications for this visit.    REVIEW OF SYSTEMS:   Constitutional: ( - ) fevers, ( - )  chills , ( - ) night sweats Eyes: ( - ) blurriness of vision, ( - ) double vision, ( - ) watery eyes Ears, nose, mouth, throat, and face: ( - ) mucositis, ( - ) sore throat Respiratory: ( - ) cough, ( - ) dyspnea, ( - ) wheezes Cardiovascular: ( - ) palpitation, ( - ) chest discomfort, ( - ) lower extremity swelling Gastrointestinal:  ( - ) nausea, ( - ) heartburn, ( - ) change in bowel habits Skin: ( - ) abnormal skin  rashes Lymphatics: ( - ) new lymphadenopathy, ( - ) easy bruising Neurological: ( - ) numbness, ( - ) tingling, ( - ) new weaknesses Behavioral/Psych: ( - ) mood change, ( - ) new changes  All other systems were reviewed with the patient and are negative.  PHYSICAL EXAMINATION: ECOG PERFORMANCE STATUS: 0 - Asymptomatic  Vitals:   07/30/19 0827  BP: (!) 179/74  Pulse: 79  Resp: 16  Temp: 97.9 F (36.6 C)  SpO2: 96%   Filed Weights   07/30/19 0827  Weight: 140 lb 12.8 oz (63.9 kg)    GENERAL: well appearing elderly Caucasian male, alert, no distress and comfortable SKIN: skin color, texture, turgor are normal, no rashes or significant lesions EYES: conjunctiva are pink and non-injected, sclera clear LUNGS: clear to auscultation and percussion with normal breathing effort HEART: regular rate & rhythm and no murmurs and no lower extremity edema Musculoskeletal: no cyanosis of digits and no clubbing  PSYCH: alert & oriented x 3, fluent speech NEURO: no focal motor/sensory deficits  LABORATORY DATA:  I have reviewed the data as listed  CMP Latest Ref Rng & Units 07/30/2019 04/06/2019 04/06/2019  Glucose 70 - 99 mg/dL 200(H) 183(H) 143(H)  BUN 8 - 23 mg/dL 18 26(H) 23  Creatinine 0.61 - 1.24 mg/dL 1.46(H) 1.38(H) 1.23  Sodium 135 - 145 mmol/L 143 145 144  Potassium 3.5 - 5.1 mmol/L 4.2 4.0 4.4  Chloride 98 - 111 mmol/L 103 105 104  CO2 22 - 32 mmol/L 28 29 33(H)  Calcium 8.9 - 10.3 mg/dL 9.2 9.5 9.5  Total Protein 6.5 - 8.1 g/dL 6.7 7.5 6.5  Total Bilirubin 0.3 - 1.2 mg/dL 0.6 0.4 0.5  Alkaline Phos 38 - 126 U/L 94 211(H) 182(H)  AST 15 - 41 U/L 15 14(L) 14  ALT 0 - 44 U/L 17 16 14    CBC Latest Ref Rng & Units 07/30/2019 04/23/2019 04/06/2019  WBC 4.0 - 10.5 K/uL 10.0 8.6 10.5  Hemoglobin 13.0 - 17.0 g/dL 13.9 12.4(L) 12.6(L)  Hematocrit 39.0 - 52.0 % 42.2 38.0(L) 36.9(L)  Platelets 150 -  400 K/uL 224 221 319     Surgical Pathology  CASE: [id number]  PATIENT:  Alrick Pelissier  Bone Marrow Report   Clinical History: MGUS, left posterior iliac crest, (ADC)   DIAGNOSIS:   BONE MARROW, ASPIRATE, CLOT, CORE:  - Plasma cell myeloma, see comment.  - No amyloid deposits.  - Minimal iron stores.   PERIPHERAL BLOOD:  - Normocytic anemia.   COMMENT:   The marrow is mildly hypercellular with increased monotypic plasma cells  (9% aspirate, 10-15% CD138 immunohistochemistry). There are no amyloid  deposits seen with Congo red, although there is a lack of larger vessels  in the core biopsy. The findings are consistent with plasma cell  myeloma.   MICROSCOPIC DESCRIPTION:   PERIPHERAL BLOOD SMEAR: There is a normocytic anemia with scattered  elliptocytes/ovalocytes. There is no rouleaux formation. Leukocytes  are present in normal numbers. Circulating plasma cells are not  identified. Platelets are present in normal numbers.   BONE MARROW ASPIRATE: Spicular cellular.  Erythroid precursors:Present in appropriate proportions. No  significant dysplasia.  Granulocytic precursors: Present in appropriate proportions. No  significant dysplasia. No increase in blasts.  Megakaryocytes: Present and largely unremarkable.  Lymphocytes/plasma cells: There is a mild increase in plasma cells (9%  by manual differential counts) with scattered atypical forms (large  forms, binucleation). Lymphocytes are not increased.   TOUCH PREPARATIONS: Similar to aspirate smears.   CLOT AND BIOPSY: The core biopsies and clot sections are mildly  hypercellular for age (85 to 30%). CD138 immunohistochemistry reveals  increased plasma cells (10 to 15%) scattered in small clusters. By  light chain in situ hybridization plasma cells are lambda restricted.  Myeloid and erythroid elements are present in appropriate proportions.  Megakaryocytes exhibit a spectrum of maturation without tight clusters.  There are few benign appearing lymphoid aggregates. Congo red is   negative for amyloid deposits.   IRON STAIN: Iron stains are performed on a bone marrow aspirate or touch  imprint smear and section of clot. The controls stained appropriately.     Storage Iron: Minimal.    Ring Sideroblasts: Absent.   ADDITIONAL DATA/TESTING: Cytogenetics, including FISH for myeloma, was  ordered and will be reported separately.   CELL COUNT DATA:   Bone Marrow count performed on 500 cells shows:  Blasts:  0%  Myeloid: 47%  Promyelocytes: 0%  Erythroid:   23%  Myelocytes:  5%  Lymphocytes:  17%  Metamyelocytes:   0%  Plasma cells: 9%  Bands:  16%  Neutrophils:  22% M:E ratio:   2.04  Eosinophils:  4%  Basophils:   0%  Monocytes:   4%   Lab Data: CBC performed on 04/23/2019 shows:  WBC: 8.6 k/uL Neutrophils:  71%  Hgb: 12.4 g/dL Lymphocytes:  25%  HCT: 38.0 %  Monocytes:   3%  MCV: 92.7 fL  Eosinophils:  0%  RDW: 13.8 %  Basophils:   1%  PLT: 221 k/uL   GROSS DESCRIPTION:   A: Aspirate smear   B: The specimen is received in B-plus fixative and consists of a 21.0 x  12.0 x 5.0 mm aggregate of red-brown clotted blood. The specimen is  entirely submitted in cassette B.   C: The specimen is received in B-plus fixative and consists of 2 cores  of tan bone, measuring 0.6 and 0.9 cm in length by 0.2 cm in diameter.  The specimen is entirely submitted in cassette C. Craig Staggers 04/25/2019)   Final Diagnosis performed by Vicente Males, MD.  Electronically signed  04/25/2019  Technical and / or Professional components performed at [facility], [place] [address], [place], [place] [address].  Immunohistochemistry Technical component (if applicable) was performed  at [facility]. [address], [place],  [place], [place] [address].  IMMUNOHISTOCHEMISTRY DISCLAIMER (if applicable):  Some of these immunohistochemical stains may have been developed and the  performance characteristics  determine by [facility]. Some  may not have been cleared or approved by the U.S. Food and Drug  Administration. The FDA has determined that such clearance or approval  is not necessary. This test is used for clinical purposes. It should not  be regarded as investigational or for research. This laboratory is  certified under the [place]  (CLIA-88) as qualified to perform high complexity clinical laboratory  testing. The controls stained appropriately.   RADIOGRAPHIC STUDIES: No relevant radiographic studies.   ASSESSMENT & PLAN [place] FANT 86 y.o. male with medical history significant for CAD s/p PCI in 2012, DM type II, Prostate cancer s/p brachytherapy, and HTN who presents for follow up for his smoldering multiple myeloma.   On exam today [name] remains at baseline.  He continues to do the activities he enjoys such as running and eating out dinner with his son.  He has had no new concerning symptoms and blood work today is reassuring with normal hemoglobin, calcium, and creatinine.  Full multiple myeloma labs are pending but overall it appears as though his disease is stable.  Treatment of smoldering multiple myeloma is a developing area of hematology.  There have been clinical trials showing benefit in the treatment of intermediate to high risk SMM (J Clin Oncol. 2020 Apr 10;38(11):1126-1137).  The current treatment recommendation consists of lenalidomide 25 mg daily for 21 of 28 days.  This therapy is however best suited for younger patients with longer life expectancy is given the long time to progression the average smoldering multiple myeloma patient has.  Review of the literature shows that intermediate risk patients have a time to progression of approximately 5 years (Blood Cancer Journal 8, 59 (2018).  Given [name] advanced age I would recommend close monitoring of his hematological parameters and creatinine/calcium to assure  that his disease is not progressing.  I have shared with [name] these statistics and recommendations.   After detailed discussion [name] was in agreement with continued monitoring of his smoldering multiple myeloma.  He acknowledged since he feels well, therefore chemotherapy would likely inhibit his physical activity and his overall wellbeing.  I am in strong agreement that holding on treatment at this time is a reasonable option.  #Smoldering Multiple Myeloma, Intermediate Risk --confirmed diagnosis based on the bone marrow biopsy, with plasma cells of 10-15% and no CRAB criteria. No evidence of amyloidosis on bone marrow stain.  --treatment of Smoldering multiple myeloma is a new idea and predominately consists of monotherapy lenalidomide. Using the [facility] 2018 20/20/20 guidelines, [name] is a borderline Intermediate Risk SMM.  --given his advance aged and lack of any CRAB criteria, I would recommend holding on treatment at this time with close clinical monitoring for progression. [name] was in agreement with close continued monitoring.  --RTC in 3 months time.   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 Gleason Score of 3+4 --[name] is s/p definitive radioactive seed placement in Nov 2018 --continue to f/u with outside urology group for routine PSA monitoring, though if we are following  [name] for a monoclonal gammopathy this is something we can monitor as well.  --last PSA collected at [facility], found to be PSA 0.2.   All questions were answered. The patient knows to call the clinic with any problems, questions or concerns.  A total of more than 20 minutes were spent face-to-face with the patient during this encounter and over half of that time was spent on counseling and coordination of care as outlined above.   Ledell Peoples, MD Department of Hematology/Oncology [place] at [facility] Phone: [phone] Pager: [phone] Email:  [email or web address]  07/31/2019 1:18 PM  Literature Support:  Rene Paci, Ron Parker AM, Buadi FK, Wylie Hail, Matous JV, Anderson DM, Emmons RV, Mahindra A, Wagner LI, Dhodapkar MV, Rajkumar SV. Randomized Trial of Lenalidomide Versus Observation in Smoldering Multiple Myeloma. J Clin Oncol. 2020 Apr 10;38(11):1126-1137. doi: 10.1200/JCO.19.01740. Epub 2019 Oct 25. PMID: 53967289; PMCID: [id number]. --Progression-free survival was significantly longer with lenalidomide compared with observation (hazard ratio, 0.28; 95% CI, 0.12 to 0.62; P = .002). One-, 2-, and 3-year progression-free survival was 98%, 93%, and 91% for the lenalidomide arm versus 89%, 76%, and 66% for the observation arm, respectively.  Lakshman, A., Rajkumar, S.V., Buadi, F.K. et al. Risk stratification of smoldering multiple myeloma incorporating revised IMWG diagnostic criteria. Blood Cancer Journal 8, 59 (2018). [email or web address] --The median TTP for low-, intermediate-, and high-risk groups were 110, 68, and 29 months, respectively (p?<?0.0001). BMPC%?>?20%, M-protein?>?2?g/dL, and FLCr?>?20 at diagnosis can be used to risk stratify patients with SMM. Patients with high-risk SMM need close follow-up and are candidates for clinical trials aiming to prevent progression.

## 2019-07-30 ENCOUNTER — Other Ambulatory Visit: Payer: Self-pay

## 2019-07-30 ENCOUNTER — Encounter: Payer: Self-pay | Admitting: Hematology and Oncology

## 2019-07-30 ENCOUNTER — Inpatient Hospital Stay: Payer: Medicare HMO | Attending: Hematology and Oncology | Admitting: Hematology and Oncology

## 2019-07-30 ENCOUNTER — Inpatient Hospital Stay: Payer: Medicare HMO

## 2019-07-30 VITALS — BP 179/74 | HR 79 | Temp 97.9°F | Resp 16 | Ht 64.0 in | Wt 140.8 lb

## 2019-07-30 DIAGNOSIS — C61 Malignant neoplasm of prostate: Secondary | ICD-10-CM | POA: Insufficient documentation

## 2019-07-30 DIAGNOSIS — D472 Monoclonal gammopathy: Secondary | ICD-10-CM

## 2019-07-30 DIAGNOSIS — C9000 Multiple myeloma not having achieved remission: Secondary | ICD-10-CM

## 2019-07-30 DIAGNOSIS — I10 Essential (primary) hypertension: Secondary | ICD-10-CM | POA: Diagnosis not present

## 2019-07-30 DIAGNOSIS — I2510 Atherosclerotic heart disease of native coronary artery without angina pectoris: Secondary | ICD-10-CM | POA: Diagnosis not present

## 2019-07-30 LAB — CMP (CANCER CENTER ONLY)
ALT: 17 U/L (ref 0–44)
AST: 15 U/L (ref 15–41)
Albumin: 3.7 g/dL (ref 3.5–5.0)
Alkaline Phosphatase: 94 U/L (ref 38–126)
Anion gap: 12 (ref 5–15)
BUN: 18 mg/dL (ref 8–23)
CO2: 28 mmol/L (ref 22–32)
Calcium: 9.2 mg/dL (ref 8.9–10.3)
Chloride: 103 mmol/L (ref 98–111)
Creatinine: 1.46 mg/dL — ABNORMAL HIGH (ref 0.61–1.24)
GFR, Est AFR Am: 50 mL/min — ABNORMAL LOW (ref >60)
GFR, Estimated: 43 mL/min — ABNORMAL LOW (ref >60)
Glucose, Bld: 200 mg/dL — ABNORMAL HIGH (ref 70–99)
Potassium: 4.2 mmol/L (ref 3.5–5.1)
Sodium: 143 mmol/L (ref 135–145)
Total Bilirubin: 0.6 mg/dL (ref 0.3–1.2)
Total Protein: 6.7 g/dL (ref 6.5–8.1)

## 2019-07-30 LAB — LACTATE DEHYDROGENASE: LDH: 166 U/L (ref 98–192)

## 2019-07-30 LAB — CBC WITH DIFFERENTIAL (CANCER CENTER ONLY)
Abs Immature Granulocytes: 0.03 10*3/uL (ref 0.00–0.07)
Basophils Absolute: 0.1 10*3/uL (ref 0.0–0.1)
Basophils Relative: 1 %
Eosinophils Absolute: 0.4 10*3/uL (ref 0.0–0.5)
Eosinophils Relative: 4 %
HCT: 42.2 % (ref 39.0–52.0)
Hemoglobin: 13.9 g/dL (ref 13.0–17.0)
Immature Granulocytes: 0 %
Lymphocytes Relative: 29 %
Lymphs Abs: 2.9 10*3/uL (ref 0.7–4.0)
MCH: 29.6 pg (ref 26.0–34.0)
MCHC: 32.9 g/dL (ref 30.0–36.0)
MCV: 90 fL (ref 80.0–100.0)
Monocytes Absolute: 1 10*3/uL (ref 0.1–1.0)
Monocytes Relative: 10 %
Neutro Abs: 5.5 10*3/uL (ref 1.7–7.7)
Neutrophils Relative %: 56 %
Platelet Count: 224 10*3/uL (ref 150–400)
RBC: 4.69 MIL/uL (ref 4.22–5.81)
RDW: 12.9 % (ref 11.5–15.5)
WBC Count: 10 10*3/uL (ref 4.0–10.5)
nRBC: 0 % (ref 0.0–0.2)

## 2019-07-31 ENCOUNTER — Encounter: Payer: Self-pay | Admitting: Hematology and Oncology

## 2019-07-31 LAB — BETA 2 MICROGLOBULIN, SERUM: Beta-2 Microglobulin: 3.7 mg/L — ABNORMAL HIGH (ref 0.6–2.4)

## 2019-07-31 LAB — KAPPA/LAMBDA LIGHT CHAINS
Kappa free light chain: 22.2 mg/L — ABNORMAL HIGH (ref 3.3–19.4)
Kappa, lambda light chain ratio: 0.04 — ABNORMAL LOW (ref 0.26–1.65)
Lambda free light chains: 587.3 mg/L — ABNORMAL HIGH (ref 5.7–26.3)

## 2019-08-01 DIAGNOSIS — C9000 Multiple myeloma not having achieved remission: Secondary | ICD-10-CM | POA: Diagnosis not present

## 2019-08-01 DIAGNOSIS — C61 Malignant neoplasm of prostate: Secondary | ICD-10-CM | POA: Diagnosis not present

## 2019-08-01 DIAGNOSIS — I10 Essential (primary) hypertension: Secondary | ICD-10-CM | POA: Diagnosis not present

## 2019-08-01 DIAGNOSIS — I2510 Atherosclerotic heart disease of native coronary artery without angina pectoris: Secondary | ICD-10-CM | POA: Diagnosis not present

## 2019-08-01 LAB — MULTIPLE MYELOMA PANEL, SERUM
Albumin SerPl Elph-Mcnc: 3.6 g/dL (ref 2.9–4.4)
Albumin/Glob SerPl: 1.4 (ref 0.7–1.7)
Alpha 1: 0.2 g/dL (ref 0.0–0.4)
Alpha2 Glob SerPl Elph-Mcnc: 0.7 g/dL (ref 0.4–1.0)
B-Globulin SerPl Elph-Mcnc: 0.9 g/dL (ref 0.7–1.3)
Gamma Glob SerPl Elph-Mcnc: 0.9 g/dL (ref 0.4–1.8)
Globulin, Total: 2.7 g/dL (ref 2.2–3.9)
IgA: 166 mg/dL (ref 61–437)
IgG (Immunoglobin G), Serum: 956 mg/dL (ref 603–1613)
IgM (Immunoglobulin M), Srm: 60 mg/dL (ref 15–143)
Total Protein ELP: 6.3 g/dL (ref 6.0–8.5)

## 2019-08-03 LAB — UPEP/UIFE/LIGHT CHAINS/TP, 24-HR UR
% BETA, Urine: 7.8 %
ALPHA 1 URINE: 3.8 %
Albumin, U: 60.5 %
Alpha 2, Urine: 3.4 %
Free Kappa Lt Chains,Ur: 84.92 mg/L (ref 0.63–113.79)
Free Kappa/Lambda Ratio: 0.08 — ABNORMAL LOW (ref 1.03–31.76)
Free Lambda Lt Chains,Ur: 1111.49 mg/L — ABNORMAL HIGH (ref 0.47–11.77)
GAMMA GLOBULIN URINE: 24.6 %
M-SPIKE %, Urine: 19.8 % — ABNORMAL HIGH
M-Spike, Mg/24 Hr: 308 mg/24 hr — ABNORMAL HIGH
Total Protein, Urine-Ur/day: 1553 mg/24 hr — ABNORMAL HIGH (ref 30–150)
Total Protein, Urine: 172.6 mg/dL
Total Volume: 900

## 2019-08-06 ENCOUNTER — Telehealth: Payer: Self-pay | Admitting: *Deleted

## 2019-08-06 NOTE — Telephone Encounter (Signed)
-----   Message from Orson Slick, MD sent at 08/06/2019  9:10 AM EST ----- Please let Mr. Trewin know that his Smoldering Myeloma labs are all stable. We will have him return in 3 months time for continued monitoring.   [name]  ----- Message ----- From: Buel Ream, Lab In [place] Sent: 07/30/2019   8:25 AM EST To: Orson Slick, MD

## 2019-08-06 NOTE — Telephone Encounter (Signed)
TCT patient regarding lab results from 07/30/19. Spoke with patient and per Dr. Lorenso Courier, his labs are stable at this time.  Advised that we would see him back in 3 months for labs and f/u visit. He voiced understanding.  No further questions or concerns

## 2019-08-13 ENCOUNTER — Ambulatory Visit: Payer: Medicare HMO | Attending: Internal Medicine

## 2019-08-13 DIAGNOSIS — Z23 Encounter for immunization: Secondary | ICD-10-CM

## 2019-08-13 NOTE — Progress Notes (Signed)
   Covid-19 Vaccination Clinic  Name:  [name]    MRN: [id number] DOB: 03/06/1932  08/13/2019  [name] was observed post Covid-19 immunization for 15 minutes without incident. He was provided with Vaccine Information Sheet and instruction to access the V-Safe system.   [name] was instructed to call 911 with any severe reactions post vaccine: [name] Difficulty breathing  . Swelling of face and throat  . A fast heartbeat  . A bad rash all over body  . Dizziness and weakness   Immunizations Administered    Name Date Dose VIS Date Route   Pfizer COVID-19 Vaccine 08/13/2019 11:51 AM 0.3 mL 05/18/2019 Intramuscular   Manufacturer: [place]   Lot: [id number]   [place]: [id number]

## 2019-09-06 NOTE — Progress Notes (Signed)
Nurse connected with patient 09/10/19 at  8:00 AM EDT by a telephone enabled telemedicine application and verified that I am speaking with the correct person using two identifiers. Patient stated full name and DOB. Patient gave permission to continue with virtual visit. Patient's location was at home and Nurse's location was at [place] office.   Subjective:   [name] is a 86 y.o. male who presents for Medicare Annual/Subsequent preventive examination.  Pt runs in Senior games every year. Attends church online every Sunday.   Review of Systems:  Home Safety/Smoke Alarms: Feels safe in home. Smoke alarms in place.  Lives in 2 story home. Son lives w/ him. No issues w/ stairs.   Male:   PSA-  Lab Results  Component Value Date   PSA 9.32 (H) 08/30/2016   PSA 3.74 02/28/2014   PSA 3.53 10/20/2009       Objective:    Vitals: Unable to assess. This visit is enabled though telemedicine due to Covid 19.   Advanced Directives 09/10/2019 07/30/2019 04/06/2019 03/20/2019 05/06/2017 02/16/2017 04/22/2016  Does Patient Have a Medical Advance Directive? Yes Yes Yes No Yes Yes No  Type of Paramedic of [place];Living will [employer];Living will [place];Living will - [place];Living will [place];Living will -  Does patient want to make changes to medical advance directive? No - Patient declined No - Patient declined - - No - Patient declined No - Patient declined -  Copy of [place] in Chart? No - copy requested - No - copy requested - No - copy requested No - copy requested -  Would patient like information on creating a medical advance directive? - - - No - Patient declined - - No - patient declined information    Tobacco Social History   Tobacco Use  Smoking Status Former Smoker  . Packs/day: 0.25  . Years: 3.00  . Pack years: 0.75  . Quit date: 06/07/1958  .  Years since quitting: 61.3  Smokeless Tobacco Never Used     Counseling given: Not Answered   Clinical Intake: Pain : No/denies pain     Past Medical History:  Diagnosis Date  . CAD (coronary artery disease)    [facility] 03/25/11 by Dr. Burt Knack:  pLAD 99%, oCFX 20-30%, pOM1 40%, dAVCFX 20-30%.  EF was normal on nuclear study.  He was treated with a Promus DES to his pLAD.   [name] Colon polyps 1996   villous adenoma  . DM type 2 (diabetes mellitus, type 2) ([place]) 2002  . Fuchs' corneal dystrophy   . Hyperlipidemia   . Hypertension   . Macular degeneration    posterior vitreous vitreous detachment  . Prostate cancer [facility]) dx 2018   Past Surgical History:  Procedure Laterality Date  . CARDIAC CATHETERIZATION  03/25/2011  . cataract Bilateral 2010  . corenea implants  june and sept 2018   [name]  . CORONARY STENT PLACEMENT  03/25/2011  . RADIOACTIVE SEED IMPLANT N/A 05/06/2017   Procedure: RADIOACTIVE SEED IMPLANT/BRACHYTHERAPY IMPLANT;  Surgeon: Kathie Rhodes, MD;  Location: [place];  Service: Urology;  Laterality: N/A;  . SPACE OAR INSTILLATION N/A 05/06/2017   Procedure: SPACE OAR INSTILLATION;  Surgeon: Kathie Rhodes, MD;  Location: [facility];  Service: Urology;  Laterality: N/A;   Family History  Problem Relation Age of Onset  . Coronary artery disease Father 2  deceased  . Stomach cancer Mother 48   Social History   Socioeconomic History  . Marital status: Widowed    Spouse name: Not on file  . Number of children: Not on file  . Years of education: Not on file  . Highest education level: Not on file  Occupational History  . Not on file  Tobacco Use  . Smoking status: Former Smoker    Packs/day: 0.25    Years: 3.00    Pack years: 0.75    Quit date: 06/07/1958    Years since quitting: 61.3  . Smokeless tobacco: Never Used  Substance and Sexual Activity  . Alcohol use: Yes    Comment: 1 glass wine per day  . Drug  use: No  . Sexual activity: Not Currently  Other Topics Concern  . Not on file  Social History Narrative  . Not on file   Social Determinants of Health   Financial Resource Strain:   . Difficulty of Paying Living Expenses:   Food Insecurity:   . Worried About Charity fundraiser in the Last Year:   . Arboriculturist in the Last Year:   Transportation Needs:   . Film/video editor (Medical):   [name] Lack of Transportation (Non-Medical):   Physical Activity:   . Days of Exercise per Week:   . Minutes of Exercise per Session:   Stress:   . Feeling of Stress :   Social Connections:   . Frequency of Communication with Friends and Family:   . Frequency of Social Gatherings with Friends and Family:   . Attends Religious Services:   . Active Member of Clubs or Organizations:   . Attends Archivist Meetings:   [name] Marital Status:     Outpatient Encounter Medications as of 09/10/2019  Medication Sig  . Alcohol Swabs PADS Use as directed once a day  . amLODipine (NORVASC) 5 MG tablet TAKE 1 TABLET EVERY DAY  . aspirin 81 MG tablet Take 81 mg by mouth daily.  [name] atorvastatin (LIPITOR) 10 MG tablet TAKE 1 TABLET EVERY DAY  . Blood Glucose Calibration (TRUE METRIX LEVEL 1) Low SOLN Use as directed.  . Blood Glucose Monitoring Suppl (TRUE METRIX AIR GLUCOSE METER) w/Device KIT 1 each by Does not apply route daily. Use as directed once a day  . brimonidine-timolol (COMBIGAN) 0.2-0.5 % ophthalmic solution Place 1 drop into the right eye 2 (two) times daily.   . fish oil-omega-3 fatty acids 1000 MG capsule Take 2 g by mouth daily.    [name] glimepiride (AMARYL) 4 MG tablet TAKE 1 TABLET EVERY DAY WITH BREAKFAST  . glucose blood (TRUE METRIX BLOOD GLUCOSE TEST) test strip Use as directed once a day.  Dx code: E11.9  . losartan-hydrochlorothiazide (HYZAAR) 100-25 MG tablet TAKE 1 TABLET EVERY DAY  . Multiple Vitamin (MULTIVITAMIN) tablet Take 1 tablet by mouth daily.  . TRUEplus Lancets 30G  MISC Use as directed once a day.  E11.9  . SitaGLIPtin-MetFORMIN HCl (JANUMET XR) [phone] MG TB24 Take 1 tablet by mouth daily. (Patient not taking: Reported on 09/10/2019)  . tamsulosin (FLOMAX) 0.4 MG CAPS capsule TAKE 1 CAPSULE (0.4 MG TOTAL) BY MOUTH DAILY. (Patient not taking: Reported on 09/10/2019)  . [DISCONTINUED] brimonidine-timolol (COMBIGAN) 0.2-0.5 % ophthalmic solution Apply 0.5 drops to eye 2 (two) times daily.   No facility-administered encounter medications on file as of 09/10/2019.    Activities of Daily Living In your present state of health, do  you have any difficulty performing the following activities: 09/10/2019  Hearing? N  Vision? N  Difficulty concentrating or making decisions? N  Walking or climbing stairs? N  Dressing or bathing? N  Doing errands, shopping? N  Preparing Food and eating ? N  Using the Toilet? N  In the past six months, have you accidently leaked urine? N  Do you have problems with loss of bowel control? N  Managing your Medications? N  Managing your Finances? N  Housekeeping or managing your Housekeeping? N  Some recent data might be hidden    Patient Care Team: Carollee Herter, Alferd Apa, DO as PCP - General (Family Medicine) Kathie Rhodes, MD as Consulting Physician (Urology) Tyler Pita, MD as Consulting Physician (Radiation Oncology) Marilynne Halsted, MD as Referring Physician (Ophthalmology)   Assessment:   This is a routine wellness examination for [place]. Physical assessment deferred to PCP.  Exercise Activities and Dietary recommendations Current Exercise Habits: Home exercise routine, Type of exercise: walking, Time (Minutes): 30, Frequency (Times/Week): 7, Weekly Exercise (Minutes/Week): 210, Intensity: Mild, Exercise limited by: None identified Diet (meal preparation, eat out, water intake, caffeinated beverages, dairy products, fruits and vegetables): in general, a "healthy" diet  , well balanced  Goals    . maintain healthy  active lifestyle.       Fall Risk Fall Risk  09/10/2019 04/04/2018 02/16/2017 09/01/2015 08/26/2014  Falls in the past year? 1 Yes No No No  Comment dog pulled him down chasing a squirrel - - - -  Number falls in past yr: 0 - - - -  Injury with Fall? 1 - - - -  Follow up Education provided;Falls prevention discussed - - - -   Depression Screen PHQ 2/9 Scores 09/10/2019 04/04/2018 02/16/2017 03/01/2016  PHQ - 2 Score 0 0 0 0    Cognitive Function   Ad8 score reviewed for issues:  Issues making decisions:no  Less interest in hobbies / activities:no  Repeats questions, stories (family complaining):no  Trouble using ordinary gadgets (microwave, computer, phone):no  Forgets the month or year: no  Mismanaging finances: no  Remembering appts:no  Daily problems with thinking and/or memory:no Ad8 score is=0       Immunization History  Administered Date(s) Administered  . Fluad Quad(high Dose 65+) 04/06/2019  . Influenza Split 03/05/2011, 04/24/2012  . Influenza Whole 02/27/2008, 03/04/2010  . Influenza, High Dose Seasonal PF 04/01/2017, 03/14/2018  . Influenza,inj,quad, With Preservative 03/07/2013  . Influenza-Unspecified 03/08/2015  . PFIZER SARS-COV-2 Vaccination 08/13/2019  . Pneumococcal Conjugate-13 08/26/2014  . Pneumococcal Polysaccharide-23 02/27/2008   Screening Tests Health Maintenance  Topic Date Due  . TETANUS/TDAP  Never done  . FOOT EXAM  09/27/2018  . HEMOGLOBIN A1C  10/05/2019  . OPHTHALMOLOGY EXAM  11/06/2019  . INFLUENZA VACCINE  01/06/2020  . PNA vac Low Risk Adult  Completed     Plan:    Please schedule your next medicare wellness visit with me in 1 yr.  Continue to eat heart healthy diet (full of fruits, vegetables, whole grains, lean protein, water--limit salt, fat, and sugar intake) and increase physical activity as tolerated.  Continue doing brain stimulating activities (puzzles, reading, adult coloring books, staying active) to keep memory  sharp.   Bring a copy of your living will and/or healthcare power of attorney to your next office visit.  I have personally reviewed and noted the following in the patient's chart:   . Medical and social history . Use of alcohol, tobacco or illicit drugs  .  Current medications and supplements . Functional ability and status . Nutritional status . Physical activity . Advanced directives . List of other physicians . Hospitalizations, surgeries, and ER visits in previous 12 months . Vitals . Screenings to include cognitive, depression, and falls . Referrals and appointments  In addition, I have reviewed and discussed with patient certain preventive protocols, quality metrics, and best practice recommendations. A written personalized care plan for preventive services as well as general preventive health recommendations were provided to patient.     Shela Nevin, [place]  09/10/2019

## 2019-09-07 ENCOUNTER — Ambulatory Visit: Payer: Medicare HMO | Admitting: *Deleted

## 2019-09-10 ENCOUNTER — Encounter: Payer: Self-pay | Admitting: *Deleted

## 2019-09-10 ENCOUNTER — Ambulatory Visit (INDEPENDENT_AMBULATORY_CARE_PROVIDER_SITE_OTHER): Payer: Medicare HMO | Admitting: *Deleted

## 2019-09-10 ENCOUNTER — Other Ambulatory Visit: Payer: Self-pay

## 2019-09-10 DIAGNOSIS — Z0000 Encounter for general adult medical examination without abnormal findings: Secondary | ICD-10-CM

## 2019-09-10 NOTE — Patient Instructions (Signed)
Please schedule your next medicare wellness visit with me in 1 yr.  Continue to eat heart healthy diet (full of fruits, vegetables, whole grains, lean protein, water--limit salt, fat, and sugar intake) and increase physical activity as tolerated.  Continue doing brain stimulating activities (puzzles, reading, adult coloring books, staying active) to keep memory sharp.   Bring a copy of your living will and/or healthcare power of attorney to your next office visit.   [name] , Thank you for taking time to come for your Medicare Wellness Visit. I appreciate your ongoing commitment to your health goals. Please review the following plan we discussed and let me know if I can assist you in the future.   These are the goals we discussed: Goals    . maintain healthy active lifestyle.       This is a list of the screening recommended for you and due dates:  Health Maintenance  Topic Date Due  . Tetanus Vaccine  Never done  . Complete foot exam   09/27/2018  . Hemoglobin A1C  10/05/2019  . Eye exam for diabetics  11/06/2019  . Flu Shot  01/06/2020  . Pneumonia vaccines  Completed    Preventive Care 6 Years and Older, Male Preventive care refers to lifestyle choices and visits with your health care provider that can promote health and wellness. This includes:  A yearly physical exam. This is also called an annual well check.  Regular dental and eye exams.  Immunizations.  Screening for certain conditions.  Healthy lifestyle choices, such as diet and exercise. What can I expect for my preventive care visit? Physical exam Your health care provider will check:  Height and weight. These may be used to calculate body mass index (BMI), which is a measurement that tells if you are at a healthy weight.  Heart rate and blood pressure.  Your skin for abnormal spots. Counseling Your health care provider may ask you questions about:  Alcohol, tobacco, and drug use.  Emotional  well-being.  Home and relationship well-being.  Sexual activity.  Eating habits.  History of falls.  Memory and ability to understand (cognition).  Work and work Statistician. What immunizations do I need?  Influenza (flu) vaccine  This is recommended every year. Tetanus, diphtheria, and pertussis (Tdap) vaccine  You may need a Td booster every 10 years. Varicella (chickenpox) vaccine  You may need this vaccine if you have not already been vaccinated. Zoster (shingles) vaccine  You may need this after age 33. Pneumococcal conjugate (PCV13) vaccine  One dose is recommended after age 24. Pneumococcal polysaccharide (PPSV23) vaccine  One dose is recommended after age 96. Measles, mumps, and rubella (MMR) vaccine  You may need at least one dose of MMR if you were born in 1957 or later. You may also need a second dose. Meningococcal conjugate (MenACWY) vaccine  You may need this if you have certain conditions. Hepatitis A vaccine  You may need this if you have certain conditions or if you travel or work in places where you may be exposed to hepatitis A. Hepatitis B vaccine  You may need this if you have certain conditions or if you travel or work in places where you may be exposed to hepatitis B. Haemophilus influenzae type b (Hib) vaccine  You may need this if you have certain conditions. You may receive vaccines as individual doses or as more than one vaccine together in one shot (combination vaccines). Talk with your health care provider about  the risks and benefits of combination vaccines. What tests do I need? Blood tests  Lipid and cholesterol levels. These may be checked every 5 years, or more frequently depending on your overall health.  Hepatitis C test.  Hepatitis B test. Screening  Lung cancer screening. You may have this screening every year starting at age 90 if you have a 30-pack-year history of smoking and currently smoke or have quit within the  past 15 years.  Colorectal cancer screening. All adults should have this screening starting at age 66 and continuing until age 17. Your health care provider may recommend screening at age 1 if you are at increased risk. You will have tests every 1-10 years, depending on your results and the type of screening test.  Prostate cancer screening. Recommendations will vary depending on your family history and other risks.  Diabetes screening. This is done by checking your blood sugar (glucose) after you have not eaten for a while (fasting). You may have this done every 1-3 years.  Abdominal aortic aneurysm (AAA) screening. You may need this if you are a current or former smoker.  Sexually transmitted disease (STD) testing. Follow these instructions at home: Eating and drinking  Eat a diet that includes fresh fruits and vegetables, whole grains, lean protein, and low-fat dairy products. Limit your intake of foods with high amounts of sugar, saturated fats, and salt.  Take vitamin and mineral supplements as recommended by your health care provider.  Do not drink alcohol if your health care provider tells you not to drink.  If you drink alcohol: ? Limit how much you have to 0-2 drinks a day. ? Be aware of how much alcohol is in your drink. In the [place] one drink equals one 12 oz bottle of beer (355 mL), one 5 oz glass of wine (148 mL), or one 1 oz glass of hard liquor (44 mL). Lifestyle  Take daily care of your teeth and gums.  Stay active. Exercise for at least 30 minutes on 5 or more days each week.  Do not use any products that contain nicotine or tobacco, such as cigarettes, e-cigarettes, and chewing tobacco. If you need help quitting, ask your health care provider.  If you are sexually active, practice safe sex. Use a condom or other form of protection to prevent STIs (sexually transmitted infections).  Talk with your health care provider about taking a low-dose aspirin or  statin. What's next?  Visit your health care provider once a year for a well check visit.  Ask your health care provider how often you should have your eyes and teeth checked.  Stay up to date on all vaccines. This information is not intended to replace advice given to you by your health care provider. Make sure you discuss any questions you have with your health care provider. Document Revised: 05/18/2018 Document Reviewed: 05/18/2018 Elsevier Patient Education  2020 [employer].

## 2019-09-12 ENCOUNTER — Ambulatory Visit: Payer: Medicare HMO

## 2019-09-18 ENCOUNTER — Ambulatory Visit: Payer: Medicare HMO | Attending: Family Medicine

## 2019-09-18 DIAGNOSIS — Z23 Encounter for immunization: Secondary | ICD-10-CM

## 2019-09-18 NOTE — Progress Notes (Signed)
   Covid-19 Vaccination Clinic  Name:  [name]    MRN: [id number] DOB: [birth year]  09/18/2019  [name] was observed post Covid-19 immunization for 15 minutes without incident. He was provided with Vaccine Information Sheet and instruction to access the V-Safe system.   [name] was instructed to call 911 with any severe reactions post vaccine: [name] Difficulty breathing  . Swelling of face and throat  . A fast heartbeat  . A bad rash all over body  . Dizziness and weakness   Immunizations Administered    Name Date Dose VIS Date Route   Pfizer COVID-19 Vaccine 09/18/2019  8:13 AM 0.3 mL 05/18/2019 Intramuscular   Manufacturer: [place]   Lot: [id number]   [place]: [id number]

## 2019-10-04 DIAGNOSIS — N2581 Secondary hyperparathyroidism of renal origin: Secondary | ICD-10-CM | POA: Diagnosis not present

## 2019-10-04 DIAGNOSIS — N189 Chronic kidney disease, unspecified: Secondary | ICD-10-CM | POA: Diagnosis not present

## 2019-10-04 DIAGNOSIS — D631 Anemia in chronic kidney disease: Secondary | ICD-10-CM | POA: Diagnosis not present

## 2019-10-04 DIAGNOSIS — I129 Hypertensive chronic kidney disease with stage 1 through stage 4 chronic kidney disease, or unspecified chronic kidney disease: Secondary | ICD-10-CM | POA: Diagnosis not present

## 2019-10-04 DIAGNOSIS — N1832 Chronic kidney disease, stage 3b: Secondary | ICD-10-CM | POA: Diagnosis not present

## 2019-10-04 DIAGNOSIS — C61 Malignant neoplasm of prostate: Secondary | ICD-10-CM | POA: Diagnosis not present

## 2019-10-04 DIAGNOSIS — N183 Chronic kidney disease, stage 3 unspecified: Secondary | ICD-10-CM | POA: Diagnosis not present

## 2019-10-04 DIAGNOSIS — C9000 Multiple myeloma not having achieved remission: Secondary | ICD-10-CM | POA: Diagnosis not present

## 2019-10-05 ENCOUNTER — Ambulatory Visit: Payer: Medicare HMO | Admitting: Family Medicine

## 2019-10-09 ENCOUNTER — Telehealth: Payer: Self-pay | Admitting: Family Medicine

## 2019-10-09 NOTE — Progress Notes (Signed)
°  Chronic Care Management   Note  10/09/2019 Name: [name] MRN: [id number] DOB: Jan 07, 1932  [name] is a 86 y.o. year old male who is a primary care patient of [name], DO. I reached out to [name] by phone today in response to a referral sent by [name] PCP, [name], DO.   [name] was given information about Chronic Care Management services today including:  1. CCM service includes personalized support from designated clinical staff supervised by his physician, including individualized plan of care and coordination with other care providers 2. 24/7 contact phone numbers for assistance for urgent and routine care needs. 3. Service will only be billed when office clinical staff spend 20 minutes or more in a month to coordinate care. 4. Only one practitioner may furnish and bill the service in a calendar month. 5. The patient may stop CCM services at any time (effective at the end of the month) by phone call to the office staff.   Patient agreed to services and verbal consent obtained.    This note is not being shared with the patient for the following reason: To respect privacy (The patient or proxy has requested that the information not be shared).  Follow up plan:  Raynicia Dukes [employer] Scheduler

## 2019-10-15 ENCOUNTER — Other Ambulatory Visit: Payer: Self-pay

## 2019-10-15 ENCOUNTER — Ambulatory Visit (INDEPENDENT_AMBULATORY_CARE_PROVIDER_SITE_OTHER): Payer: Medicare HMO | Admitting: Family Medicine

## 2019-10-15 ENCOUNTER — Encounter: Payer: Self-pay | Admitting: Family Medicine

## 2019-10-15 VITALS — BP 150/78 | HR 63 | Temp 97.2°F | Resp 18 | Ht 64.0 in | Wt 132.0 lb

## 2019-10-15 DIAGNOSIS — E118 Type 2 diabetes mellitus with unspecified complications: Secondary | ICD-10-CM

## 2019-10-15 DIAGNOSIS — E1169 Type 2 diabetes mellitus with other specified complication: Secondary | ICD-10-CM | POA: Diagnosis not present

## 2019-10-15 DIAGNOSIS — E785 Hyperlipidemia, unspecified: Secondary | ICD-10-CM

## 2019-10-15 DIAGNOSIS — I10 Essential (primary) hypertension: Secondary | ICD-10-CM

## 2019-10-15 DIAGNOSIS — E1136 Type 2 diabetes mellitus with diabetic cataract: Secondary | ICD-10-CM | POA: Diagnosis not present

## 2019-10-15 LAB — LIPID PANEL
Cholesterol: 172 mg/dL (ref 0–200)
HDL: 37.2 mg/dL — ABNORMAL LOW (ref >39.00)
LDL Cholesterol: 107 mg/dL — ABNORMAL HIGH (ref 0–99)
NonHDL: 134.95
Total CHOL/HDL Ratio: 5
Triglycerides: 141 mg/dL (ref 0.0–149.0)
VLDL: 28.2 mg/dL (ref 0.0–40.0)

## 2019-10-15 LAB — COMPREHENSIVE METABOLIC PANEL
ALT: 13 U/L (ref 0–53)
AST: 12 U/L (ref 0–37)
Albumin: 4.1 g/dL (ref 3.5–5.2)
Alkaline Phosphatase: 89 U/L (ref 39–117)
BUN: 24 mg/dL — ABNORMAL HIGH (ref 6–23)
CO2: 32 mEq/L (ref 19–32)
Calcium: 9.2 mg/dL (ref 8.4–10.5)
Chloride: 103 mEq/L (ref 96–112)
Creatinine, Ser: 1.29 mg/dL (ref 0.40–1.50)
GFR: 52.85 mL/min — ABNORMAL LOW (ref >60.00)
Glucose, Bld: 208 mg/dL — ABNORMAL HIGH (ref 70–99)
Potassium: 4.6 mEq/L (ref 3.5–5.1)
Sodium: 140 mEq/L (ref 135–145)
Total Bilirubin: 0.6 mg/dL (ref 0.2–1.2)
Total Protein: 6.4 g/dL (ref 6.0–8.3)

## 2019-10-15 LAB — HEMOGLOBIN A1C: Hgb A1c MFr Bld: 8.8 % — ABNORMAL HIGH (ref 4.6–6.5)

## 2019-10-15 MED ORDER — AMLODIPINE BESYLATE 10 MG PO TABS: 10.0000 mg | ORAL_TABLET | Freq: Every day | ORAL | 3 refills | Status: DC

## 2019-10-15 MED ORDER — AMLODIPINE BESYLATE 2.5 MG PO TABS: 2.5000 mg | ORAL_TABLET | Freq: Every day | ORAL | 3 refills | Status: DC

## 2019-10-15 NOTE — Progress Notes (Signed)
Patient ID: [name], male    DOB: 04-Oct-1931  Age: 86 y.o. MRN: [id number]    Subjective:  Subjective  HPI [name] presents for f/u dm, chol and htn.    HPI HYPERTENSION   Blood pressure range-good per pt   Chest pain- no      Dyspnea- no Lightheadedness- no   Edema- no  Other side effects - no   Medication compliance: good Low salt diet- yes    DIABETES    Blood Sugar ranges-good per pt   Polyuria- no New Visual problems- no  Hypoglycemic symptoms- no  Other side effects-no Medication compliance - good Last eye exam- due Foot exam- today   HYPERLIPIDEMIA  Medication compliance- good RUQ pain- no  Muscle aches- no Other side effects-no     Review of Systems  Constitutional: Negative for appetite change, diaphoresis, fatigue and unexpected weight change.  Eyes: Negative for pain, redness and visual disturbance.  Respiratory: Negative for cough, chest tightness, shortness of breath and wheezing.   Cardiovascular: Negative for chest pain, palpitations and leg swelling.  Endocrine: Negative for cold intolerance, heat intolerance, polydipsia, polyphagia and polyuria.  Genitourinary: Negative for difficulty urinating, dysuria and frequency.  Neurological: Negative for dizziness, light-headedness, numbness and headaches.    History Past Medical History:  Diagnosis Date  . CAD (coronary artery disease)    LHC 03/25/11 by Dr. Burt Knack:  pLAD 99%, oCFX 20-30%, pOM1 40%, dAVCFX 20-30%.  EF was normal on nuclear study.  He was treated with a Promus DES to his pLAD.   [name] Colon polyps 1996   villous adenoma  . DM type 2 (diabetes mellitus, type 2) ([place]) 2002  . Fuchs' corneal dystrophy   . Hyperlipidemia   . Hypertension   . Macular degeneration    posterior vitreous vitreous detachment  . Prostate cancer [facility]) dx 2018    He has a past surgical history that includes cataract (Bilateral, 2010); Coronary stent placement (03/25/2011); corenea implants (june and sept  2018); Cardiac catheterization (03/25/2011); Radioactive seed implant (N/A, 05/06/2017); and SPACE OAR INSTILLATION (N/A, 05/06/2017).   His family history includes Coronary artery disease (age of onset: 55) in his father; Stomach cancer (age of onset: 50) in his mother.He reports that he quit smoking about 61 years ago. He has a 0.75 pack-year smoking history. He has never used smokeless tobacco. He reports current alcohol use. He reports that he does not use drugs.  Current Outpatient Medications on File Prior to Visit  Medication Sig Dispense Refill  . Alcohol Swabs PADS Use as directed once a day 100 each 1  . amLODipine (NORVASC) 5 MG tablet TAKE 1 TABLET EVERY DAY 90 tablet 1  . aspirin 81 MG tablet Take 81 mg by mouth daily.    [name] atorvastatin (LIPITOR) 10 MG tablet TAKE 1 TABLET EVERY DAY 90 tablet 1  . Blood Glucose Calibration (TRUE METRIX LEVEL 1) Low SOLN Use as directed. 1 each 0  . Blood Glucose Monitoring Suppl (TRUE METRIX AIR GLUCOSE METER) w/Device KIT 1 each by Does not apply route daily. Use as directed once a day 1 kit 0  . brimonidine-timolol (COMBIGAN) 0.2-0.5 % ophthalmic solution Place 1 drop into the right eye 2 (two) times daily.     . fish oil-omega-3 fatty acids 1000 MG capsule Take 2 g by mouth daily.      [name] glimepiride (AMARYL) 4 MG tablet TAKE 1 TABLET EVERY DAY WITH BREAKFAST 90 tablet 1  . glucose  blood (TRUE METRIX BLOOD GLUCOSE TEST) test strip Use as directed once a day.  Dx code: E11.9 100 each 12  . losartan-hydrochlorothiazide (HYZAAR) 100-25 MG tablet TAKE 1 TABLET EVERY DAY 90 tablet 1  . Multiple Vitamin (MULTIVITAMIN) tablet Take 1 tablet by mouth daily.    . SitaGLIPtin-MetFORMIN HCl (JANUMET XR) [phone] MG TB24 Take 1 tablet by mouth daily. 90 tablet 1  . tamsulosin (FLOMAX) 0.4 MG CAPS capsule TAKE 1 CAPSULE (0.4 MG TOTAL) BY MOUTH DAILY. 90 capsule 3  . TRUEplus Lancets 30G MISC Use as directed once a day.  E11.9 100 each 1   No current  facility-administered medications on file prior to visit.     Objective:  Objective  Physical Exam Vitals and nursing note reviewed.  Constitutional:      General: He is sleeping.     Appearance: He is well-developed.  HENT:     Head: Normocephalic and atraumatic.  Eyes:     Pupils: Pupils are equal, round, and reactive to light.  Neck:     Thyroid: No thyromegaly.  Cardiovascular:     Rate and Rhythm: Normal rate and regular rhythm.     Heart sounds: No murmur.  Pulmonary:     Effort: Pulmonary effort is normal. No respiratory distress.     Breath sounds: Normal breath sounds. No wheezing or rales.  Chest:     Chest wall: No tenderness.  Musculoskeletal:        General: No tenderness.     Cervical back: Normal range of motion and neck supple.  Skin:    General: Skin is warm and dry.  Neurological:     Mental Status: He is oriented to person, place, and time.  Psychiatric:        Behavior: Behavior normal.        Thought Content: Thought content normal.        Judgment: Judgment normal.    Diabetic Foot Exam - Simple   Simple Foot Form Diabetic Foot exam was performed with the following findings: Yes 10/15/2019 12:43 PM  Visual Inspection No deformities, no ulcerations, no other skin breakdown bilaterally: Yes Sensation Testing Intact to touch and monofilament testing bilaterally: Yes Pulse Check Posterior Tibialis and Dorsalis pulse intact bilaterally: Yes Comments     BP (!) 150/78 (BP Location: Right Arm, Patient Position: Sitting, Cuff Size: Normal)   Pulse 63   Temp (!) 97.2 F (36.2 C) (Temporal)   Resp 18   Ht 5' 4"  (1.626 m)   Wt 132 lb (59.9 kg)   SpO2 96%   BMI 22.66 kg/m  Wt Readings from Last 3 Encounters:  10/15/19 132 lb (59.9 kg)  07/30/19 140 lb 12.8 oz (63.9 kg)  05/23/19 130 lb (59 kg)     Lab Results  Component Value Date   WBC 10.0 07/30/2019   HGB 13.9 07/30/2019   HCT 42.2 07/30/2019   PLT 224 07/30/2019   GLUCOSE 208 (H)  10/15/2019   CHOL 172 10/15/2019   TRIG 141.0 10/15/2019   HDL 37.20 (L) 10/15/2019   LDLCALC 107 (H) 10/15/2019   ALT 13 10/15/2019   AST 12 10/15/2019   NA 140 10/15/2019   K 4.6 10/15/2019   CL 103 10/15/2019   CREATININE 1.29 10/15/2019   BUN 24 (H) 10/15/2019   CO2 32 10/15/2019   TSH 2.47 10/20/2009   PSA 9.32 (H) 08/30/2016   INR 1.03 05/02/2017   HGBA1C 8.8 (H) 10/15/2019   MICROALBUR 50.7 (  H) 04/06/2019    DG Wrist 2 Views Left  Result Date: 05/23/2019 CLINICAL DATA:  Left wrist pain for 2 months following injury EXAM: LEFT WRIST - 2 VIEW COMPARISON:  03/28/2019,  03/20/2019 FINDINGS: Previously seen distal radial and ulnar fractures are again identified with mild increased healing when compared with the prior study. No new fractures are seen. No soft tissue abnormality is noted. IMPRESSION: Healing distal radial and ulnar fractures. Electronically Signed   By: Inez Catalina M.D.   On: 05/23/2019 12:40     Assessment & Plan:  Plan  I am having [name] start on amLODipine. I am also having him maintain his fish oil-omega-3 fatty acids, aspirin, multivitamin, brimonidine-timolol, SitaGLIPtin-MetFORMIN HCl, tamsulosin, glucose blood, TRUEplus Lancets 30G, Alcohol Swabs, True Metrix Level 1, True Metrix Air Glucose Meter, glimepiride, atorvastatin, amLODipine, and losartan-hydrochlorothiazide.  Meds ordered this encounter  Medications  . DISCONTD: amLODipine (NORVASC) 2.5 MG tablet    Sig: Take 1 tablet (2.5 mg total) by mouth daily.    Dispense:  30 tablet    Refill:  3  . amLODipine (NORVASC) 10 MG tablet    Sig: Take 1 tablet (10 mg total) by mouth daily.    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 Increase exercise as  tolerated. Continue current meds       Essential hypertension    Poorly controlled will alter medications, encouraged DASH diet, minimize caffeine and obtain adequate sleep. Report concerning symptoms and follow up as directed and as needed      Relevant Medications   amLODipine (NORVASC) 10 MG tablet   Other Relevant Orders   Lipid panel (Completed)   Hemoglobin A1c (Completed)   Comprehensive metabolic panel (Completed)   Hyperlipidemia associated with type 2 diabetes mellitus ([place]) - Primary    Encouraged heart healthy diet, increase exercise, avoid trans fats, consider a krill oil cap daily      Relevant Orders   Lipid panel (Completed)   Comprehensive metabolic panel (Completed)    Other Visit Diagnoses    Controlled type 2 diabetes mellitus with complication, without long-term current use of insulin ([facility])       Relevant Orders   Hemoglobin A1c (Completed)   Comprehensive metabolic panel (Completed)      Follow-up: Return in about 3 months (around 01/15/2020) for hypertension, hyperlipidemia, diabetes II.  [name], DO      i will take the cake home -- if there is any left -- going to my moms for dinner

## 2019-10-15 NOTE — Assessment & Plan Note (Signed)
hgba1c to be checked, minimize simple carbs. Increase exercise as tolerated. Continue current meds  

## 2019-10-15 NOTE — Assessment & Plan Note (Signed)
Poorly controlled will alter medications, encouraged DASH diet, minimize caffeine and obtain adequate sleep. Report concerning symptoms and follow up as directed and as needed 

## 2019-10-15 NOTE — Patient Instructions (Signed)
DASH Eating Plan DASH stands for "Dietary Approaches to Stop Hypertension." The DASH eating plan is a healthy eating plan that has been shown to reduce high blood pressure (hypertension). It may also reduce your risk for type 2 diabetes, heart disease, and stroke. The DASH eating plan may also help with weight loss. What are tips for following this plan?  General guidelines  Avoid eating more than 2,300 mg (milligrams) of salt (sodium) a day. If you have hypertension, you may need to reduce your sodium intake to 1,500 mg a day.  Limit alcohol intake to no more than 1 drink a day for nonpregnant women and 2 drinks a day for men. One drink equals 12 oz of beer, 5 oz of wine, or 1 oz of hard liquor.  Work with your health care provider to maintain a healthy body weight or to lose weight. Ask what an ideal weight is for you.  Get at least 30 minutes of exercise that causes your heart to beat faster (aerobic exercise) most days of the week. Activities may include walking, swimming, or biking.  Work with your health care provider or diet and nutrition specialist (dietitian) to adjust your eating plan to your individual calorie needs. Reading food labels   Check food labels for the amount of sodium per serving. Choose foods with less than 5 percent of the Daily Value of sodium. Generally, foods with less than 300 mg of sodium per serving fit into this eating plan.  To find whole grains, look for the word "whole" as the first word in the ingredient list. Shopping  Buy products labeled as "low-sodium" or "no salt added."  Buy fresh foods. Avoid canned foods and premade or frozen meals. Cooking  Avoid adding salt when cooking. Use salt-free seasonings or herbs instead of table salt or sea salt. Check with your health care provider or pharmacist before using salt substitutes.  Do not fry foods. Cook foods using healthy methods such as baking, boiling, grilling, and broiling instead.  Cook with  heart-healthy oils, such as olive, canola, soybean, or sunflower oil. Meal planning  Eat a balanced diet that includes: ? 5 or more servings of fruits and vegetables each day. At each meal, try to fill half of your plate with fruits and vegetables. ? Up to 6-8 servings of whole grains each day. ? Less than 6 oz of lean meat, poultry, or fish each day. A 3-oz serving of meat is about the same size as a deck of cards. One egg equals 1 oz. ? 2 servings of low-fat dairy each day. ? A serving of nuts, seeds, or beans 5 times each week. ? Heart-healthy fats. Healthy fats called Omega-3 fatty acids are found in foods such as flaxseeds and coldwater fish, like sardines, salmon, and mackerel.  Limit how much you eat of the following: ? Canned or prepackaged foods. ? Food that is high in trans fat, such as fried foods. ? Food that is high in saturated fat, such as fatty meat. ? Sweets, desserts, sugary drinks, and other foods with added sugar. ? Full-fat dairy products.  Do not salt foods before eating.  Try to eat at least 2 vegetarian meals each week.  Eat more home-cooked food and less restaurant, buffet, and fast food.  When eating at a restaurant, ask that your food be prepared with less salt or no salt, if possible. What foods are recommended? The items listed may not be a complete list. Talk with your dietitian about   what dietary choices are best for you. Grains Whole-grain or whole-wheat bread. Whole-grain or whole-wheat pasta. Brown rice. Oatmeal. Quinoa. Bulgur. Whole-grain and low-sodium cereals. Pita bread. Low-fat, low-sodium crackers. Whole-wheat flour tortillas. Vegetables Fresh or frozen vegetables (raw, steamed, roasted, or grilled). Low-sodium or reduced-sodium tomato and vegetable juice. Low-sodium or reduced-sodium tomato sauce and tomato paste. Low-sodium or reduced-sodium canned vegetables. Fruits All fresh, dried, or frozen fruit. Canned fruit in natural juice (without  added sugar). Meat and other protein foods Skinless chicken or turkey. Ground chicken or turkey. Pork with fat trimmed off. Fish and seafood. Egg whites. Dried beans, peas, or lentils. Unsalted nuts, nut butters, and seeds. Unsalted canned beans. Lean cuts of beef with fat trimmed off. Low-sodium, lean deli meat. Dairy Low-fat (1%) or fat-free (skim) milk. Fat-free, low-fat, or reduced-fat cheeses. Nonfat, low-sodium ricotta or cottage cheese. Low-fat or nonfat yogurt. Low-fat, low-sodium cheese. Fats and oils Soft margarine without trans fats. Vegetable oil. Low-fat, reduced-fat, or light mayonnaise and salad dressings (reduced-sodium). Canola, safflower, olive, soybean, and sunflower oils. Avocado. Seasoning and other foods Herbs. Spices. Seasoning mixes without salt. Unsalted popcorn and pretzels. Fat-free sweets. What foods are not recommended? The items listed may not be a complete list. Talk with your dietitian about what dietary choices are best for you. Grains Baked goods made with fat, such as croissants, muffins, or some breads. Dry pasta or rice meal packs. Vegetables Creamed or fried vegetables. Vegetables in a cheese sauce. Regular canned vegetables (not low-sodium or reduced-sodium). Regular canned tomato sauce and paste (not low-sodium or reduced-sodium). Regular tomato and vegetable juice (not low-sodium or reduced-sodium). Pickles. Olives. Fruits Canned fruit in a light or heavy syrup. Fried fruit. Fruit in cream or butter sauce. Meat and other protein foods Fatty cuts of meat. Ribs. Fried meat. Bacon. Sausage. Bologna and other processed lunch meats. Salami. Fatback. Hotdogs. Bratwurst. Salted nuts and seeds. Canned beans with added salt. Canned or smoked fish. Whole eggs or egg yolks. Chicken or turkey with skin. Dairy Whole or 2% milk, cream, and half-and-half. Whole or full-fat cream cheese. Whole-fat or sweetened yogurt. Full-fat cheese. Nondairy creamers. Whipped toppings.  Processed cheese and cheese spreads. Fats and oils Butter. Stick margarine. Lard. Shortening. Ghee. Bacon fat. Tropical oils, such as coconut, palm kernel, or palm oil. Seasoning and other foods Salted popcorn and pretzels. Onion salt, garlic salt, seasoned salt, table salt, and sea salt. Worcestershire sauce. Tartar sauce. Barbecue sauce. Teriyaki sauce. Soy sauce, including reduced-sodium. Steak sauce. Canned and packaged gravies. Fish sauce. Oyster sauce. Cocktail sauce. Horseradish that you find on the shelf. Ketchup. Mustard. Meat flavorings and tenderizers. Bouillon cubes. Hot sauce and Tabasco sauce. Premade or packaged marinades. Premade or packaged taco seasonings. Relishes. Regular salad dressings. Where to find more information:  National Heart, Lung, and Blood Institute: www.nhlbi.nih.gov  American Heart Association: www.heart.org Summary  The DASH eating plan is a healthy eating plan that has been shown to reduce high blood pressure (hypertension). It may also reduce your risk for type 2 diabetes, heart disease, and stroke.  With the DASH eating plan, you should limit salt (sodium) intake to 2,300 mg a day. If you have hypertension, you may need to reduce your sodium intake to 1,500 mg a day.  When on the DASH eating plan, aim to eat more fresh fruits and vegetables, whole grains, lean proteins, low-fat dairy, and heart-healthy fats.  Work with your health care provider or diet and nutrition specialist (dietitian) to adjust your eating plan to your   individual calorie needs. This information is not intended to replace advice given to you by your health care provider. Make sure you discuss any questions you have with your health care provider. Document Revised: 05/06/2017 Document Reviewed: 05/17/2016 Elsevier Patient Education  2020 [employer].  

## 2019-10-15 NOTE — Assessment & Plan Note (Signed)
Encouraged heart healthy diet, increase exercise, avoid trans fats, consider a krill oil cap daily 

## 2019-10-17 ENCOUNTER — Other Ambulatory Visit: Payer: Self-pay

## 2019-10-17 ENCOUNTER — Telehealth: Payer: Self-pay

## 2019-10-17 ENCOUNTER — Other Ambulatory Visit: Payer: Self-pay | Admitting: Family Medicine

## 2019-10-17 DIAGNOSIS — E1169 Type 2 diabetes mellitus with other specified complication: Secondary | ICD-10-CM

## 2019-10-17 DIAGNOSIS — E1165 Type 2 diabetes mellitus with hyperglycemia: Secondary | ICD-10-CM

## 2019-10-17 NOTE — Telephone Encounter (Signed)
ok 

## 2019-10-17 NOTE — Telephone Encounter (Signed)
In the patients chart the Janumet hasn't been refilled since 2017. I called patient and he couldn't tell me if he was taking this medication and patient was not at home. Pt states he will give [place] a call back.

## 2019-10-17 NOTE — Telephone Encounter (Signed)
-----   Message from [name], DO sent at 10/17/2019 11:13 AM EDT ----- Diabetes not controlled---- inc janumet------ janumet xr 50/1000  2 po qd  #60  2 refills  Cholesterol--- LDL goal < 70,  HDL >30,  TG < 150.  Diet and exercise will increase HDL and decrease LDL and TG.  Fish,  Fish Oil, Flaxseed oil will also help increase the HDL and decrease Triglycerides.   Recheck labs in 3 months--- increase lipitor to 20 mg #30  2 refills  .

## 2019-10-25 ENCOUNTER — Other Ambulatory Visit: Payer: Self-pay | Admitting: Hematology and Oncology

## 2019-10-25 DIAGNOSIS — D472 Monoclonal gammopathy: Secondary | ICD-10-CM

## 2019-10-25 NOTE — Progress Notes (Signed)
Orders only

## 2019-10-26 ENCOUNTER — Inpatient Hospital Stay: Payer: Medicare HMO | Attending: Hematology and Oncology | Admitting: Hematology and Oncology

## 2019-10-26 ENCOUNTER — Other Ambulatory Visit: Payer: Self-pay

## 2019-10-26 ENCOUNTER — Inpatient Hospital Stay: Payer: Medicare HMO

## 2019-10-26 VITALS — BP 155/81 | HR 73 | Temp 97.7°F | Resp 18 | Ht 64.0 in | Wt 137.4 lb

## 2019-10-26 DIAGNOSIS — Z8546 Personal history of malignant neoplasm of prostate: Secondary | ICD-10-CM | POA: Diagnosis not present

## 2019-10-26 DIAGNOSIS — D472 Monoclonal gammopathy: Secondary | ICD-10-CM

## 2019-10-26 DIAGNOSIS — C9000 Multiple myeloma not having achieved remission: Secondary | ICD-10-CM | POA: Diagnosis not present

## 2019-10-26 DIAGNOSIS — C61 Malignant neoplasm of prostate: Secondary | ICD-10-CM

## 2019-10-26 LAB — CBC WITH DIFFERENTIAL (CANCER CENTER ONLY)
Abs Immature Granulocytes: 0.02 10*3/uL (ref 0.00–0.07)
Basophils Absolute: 0.1 10*3/uL (ref 0.0–0.1)
Basophils Relative: 1 %
Eosinophils Absolute: 0.1 10*3/uL (ref 0.0–0.5)
Eosinophils Relative: 1 %
HCT: 43.6 % (ref 39.0–52.0)
Hemoglobin: 14.6 g/dL (ref 13.0–17.0)
Immature Granulocytes: 0 %
Lymphocytes Relative: 28 %
Lymphs Abs: 2.6 10*3/uL (ref 0.7–4.0)
MCH: 30.5 pg (ref 26.0–34.0)
MCHC: 33.5 g/dL (ref 30.0–36.0)
MCV: 91.2 fL (ref 80.0–100.0)
Monocytes Absolute: 0.9 10*3/uL (ref 0.1–1.0)
Monocytes Relative: 10 %
Neutro Abs: 5.5 10*3/uL (ref 1.7–7.7)
Neutrophils Relative %: 60 %
Platelet Count: 234 10*3/uL (ref 150–400)
RBC: 4.78 MIL/uL (ref 4.22–5.81)
RDW: 13.1 % (ref 11.5–15.5)
WBC Count: 9.2 10*3/uL (ref 4.0–10.5)
nRBC: 0 % (ref 0.0–0.2)

## 2019-10-26 LAB — CMP (CANCER CENTER ONLY)
ALT: 16 U/L (ref 0–44)
AST: 14 U/L — ABNORMAL LOW (ref 15–41)
Albumin: 4 g/dL (ref 3.5–5.0)
Alkaline Phosphatase: 101 U/L (ref 38–126)
Anion gap: 12 (ref 5–15)
BUN: 23 mg/dL (ref 8–23)
CO2: 29 mmol/L (ref 22–32)
Calcium: 9.5 mg/dL (ref 8.9–10.3)
Chloride: 102 mmol/L (ref 98–111)
Creatinine: 1.59 mg/dL — ABNORMAL HIGH (ref 0.61–1.24)
GFR, Est AFR Am: 45 mL/min — ABNORMAL LOW (ref >60)
GFR, Estimated: 39 mL/min — ABNORMAL LOW (ref >60)
Glucose, Bld: 204 mg/dL — ABNORMAL HIGH (ref 70–99)
Potassium: 3.9 mmol/L (ref 3.5–5.1)
Sodium: 143 mmol/L (ref 135–145)
Total Bilirubin: 0.8 mg/dL (ref 0.3–1.2)
Total Protein: 7.2 g/dL (ref 6.5–8.1)

## 2019-10-26 LAB — LACTATE DEHYDROGENASE: LDH: 177 U/L (ref 98–192)

## 2019-10-27 LAB — BETA 2 MICROGLOBULIN, SERUM: Beta-2 Microglobulin: 3.3 mg/L — ABNORMAL HIGH (ref 0.6–2.4)

## 2019-10-29 ENCOUNTER — Telehealth: Payer: Self-pay | Admitting: Hematology and Oncology

## 2019-10-29 LAB — KAPPA/LAMBDA LIGHT CHAINS
Kappa free light chain: 29.4 mg/L — ABNORMAL HIGH (ref 3.3–19.4)
Kappa, lambda light chain ratio: 0.05 — ABNORMAL LOW (ref 0.26–1.65)
Lambda free light chains: 597.1 mg/L — ABNORMAL HIGH (ref 5.7–26.3)

## 2019-10-29 NOTE — Telephone Encounter (Signed)
Scheduled per los. Called and left msg. Mailed printout  °

## 2019-10-30 ENCOUNTER — Encounter: Payer: Self-pay | Admitting: Hematology and Oncology

## 2019-10-30 LAB — MULTIPLE MYELOMA PANEL, SERUM
Albumin SerPl Elph-Mcnc: 3.6 g/dL (ref 2.9–4.4)
Albumin/Glob SerPl: 1.3 (ref 0.7–1.7)
Alpha 1: 0.3 g/dL (ref 0.0–0.4)
Alpha2 Glob SerPl Elph-Mcnc: 0.8 g/dL (ref 0.4–1.0)
B-Globulin SerPl Elph-Mcnc: 1 g/dL (ref 0.7–1.3)
Gamma Glob SerPl Elph-Mcnc: 1 g/dL (ref 0.4–1.8)
Globulin, Total: 3 g/dL (ref 2.2–3.9)
IgA: 171 mg/dL (ref 61–437)
IgG (Immunoglobin G), Serum: 918 mg/dL (ref 603–1613)
IgM (Immunoglobulin M), Srm: 62 mg/dL (ref 15–143)
Total Protein ELP: 6.6 g/dL (ref 6.0–8.5)

## 2019-10-30 NOTE — Progress Notes (Signed)
[name] [phone]   [phone]  PROGRESS NOTE  Patient Care Team: Carollee Herter, Alferd Apa, DO as PCP - General (Family Medicine) Kathie Rhodes, MD as Consulting Physician (Urology) Tyler Pita, MD as Consulting Physician (Radiation Oncology) Marilynne Halsted, MD as Referring Physician (Ophthalmology) Day, Melvenia Beam, [facility] as Pharmacist (Pharmacist) Orson Slick, MD as Consulting Physician (Hematology and Oncology)  Hematological/Oncological History #Smoldering Multiple Myeloma, Intermediate Risk 1) 02/15/19: found to have ambda chains 645.5, kappa 27.9 with a ratio of 0.04, no serum M protein during nephrology work up.  2) 04/06/19: establish care with Dr. Lorenso Courier 3) 04/06/19: SPEP shows no serum monoclonal protein, however high levels of M protein detected in the urine. B2Microglobulin at 3.3. Bone Survey negative.  4)04/23/19: Bone Marrow biopsy performed, confirmed 10-15% clonal plasma cells (9% in aspirate). Confirmed diagnosis of Smoldering multiple myeloma, intermediate risk. WBC 8.6, Hgb 12.4 5) 10/30/2019: WBC 9.2, Hgb 14.6, Cr 1.59, Kappa 29.4, Lambda 597.1, ratio 0.05.   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 Gleason Score of 3+4 1) 05/06/17:Insertion of radioactive I-125 seeds into the prostate glandwith placement of SpaceOAR;145Gy, definitive therapy. Radiation oncologist was Dr. Tammi Klippel at [facility] 2) Subsequently followed by outside urology with serial PSA measurements. Reported last measurement in Oct 2020 at PSA 0.2.   Interval History:  [name] 86 y.o. male with medical history significant for smoldering multiple myeloma and prostate cancer presents for a follow up visit. He was last seen on 2/22/202 for a routine f/u. In the interim since his last visit [name] has had no changes in medications, no hospitalizations or emergency room visits.  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  He notes that he exceeded in the 15 and 100 m races.  He continues to exercise daily at the gym reporting that runs at least one half a mile daily.  He continues to eat well reporting a diet rich in seafood, lamb, and other meats and vegetables.  He reports that he is fully vaccinated at this time.  He does continue to drink at least one coffee daily and 1 glass of wine.  He notes that overall he feels good with no new symptoms since our last visit.  On further discussion he denies any bleeding, bruising, dark stools, shortness of breath, fatigue, lightheadedness, dizziness or new bone or back pain.  A full 10 point ROS is listed below.  MEDICAL HISTORY:  Past Medical History:  Diagnosis Date  . CAD (coronary artery disease)    LHC 03/25/11 by Dr. Burt Knack:  pLAD 99%, oCFX 20-30%, pOM1 40%, dAVCFX 20-30%.  EF was normal on nuclear study.  He was treated with a Promus DES to his pLAD.   [name] Colon polyps 1996   villous adenoma  . DM type 2 (diabetes mellitus, type 2) ([place]) 2002  . Fuchs' corneal dystrophy   . Hyperlipidemia   . Hypertension   . Macular degeneration    posterior vitreous vitreous detachment  . Prostate cancer [facility]) dx 2018    SURGICAL HISTORY: Past Surgical History:  Procedure Laterality Date  . CARDIAC CATHETERIZATION  03/25/2011  . cataract Bilateral 2010  . corenea implants  june and sept 2018   [name]  . CORONARY STENT PLACEMENT  03/25/2011  . RADIOACTIVE SEED IMPLANT N/A 05/06/2017   Procedure: RADIOACTIVE SEED IMPLANT/BRACHYTHERAPY IMPLANT;  Surgeon: Kathie Rhodes,  MD;  Location: [facility];  Service: Urology;  Laterality: N/A;  . SPACE OAR INSTILLATION N/A 05/06/2017   Procedure: SPACE OAR INSTILLATION;  Surgeon: Kathie Rhodes, MD;  Location: [facility];  Service: Urology;  Laterality: N/A;    SOCIAL HISTORY: Social History   Socioeconomic  History  . Marital status: Widowed    Spouse name: Not on file  . Number of children: Not on file  . Years of education: Not on file  . Highest education level: Not on file  Occupational History  . Not on file  Tobacco Use  . Smoking status: Former Smoker    Packs/day: 0.25    Years: 3.00    Pack years: 0.75    Quit date: 06/07/1958    Years since quitting: 61.4  . Smokeless tobacco: Never Used  Substance and Sexual Activity  . Alcohol use: Yes    Comment: 1 glass wine per day  . Drug use: No  . Sexual activity: Not Currently  Other Topics Concern  . Not on file  Social History Narrative  . Not on file   Social Determinants of Health   Financial Resource Strain: Low Risk   . Difficulty of Paying Living Expenses: Not hard at all  Food Insecurity: No Food Insecurity  . Worried About Charity fundraiser in the Last Year: Never true  . Ran Out of Food in the Last Year: Never true  Transportation Needs: No Transportation Needs  . Lack of Transportation (Medical): No  . Lack of Transportation (Non-Medical): No  Physical Activity:   . Days of Exercise per Week:   . Minutes of Exercise per Session:   Stress:   . Feeling of Stress :   Social Connections:   . Frequency of Communication with Friends and Family:   . Frequency of Social Gatherings with Friends and Family:   . Attends Religious Services:   . Active Member of Clubs or Organizations:   . Attends Archivist Meetings:   [name] Marital Status:   Intimate Partner Violence:   . Fear of Current or Ex-Partner:   . Emotionally Abused:   [name] Physically Abused:   . Sexually Abused:     FAMILY HISTORY: Family History  Problem Relation Age of Onset  . Coronary artery disease Father 23       deceased  . Stomach cancer Mother 62    ALLERGIES:  has No Known Allergies.  MEDICATIONS:  Current Outpatient Medications  Medication Sig Dispense Refill  . Alcohol Swabs PADS Use as directed once a day 100 each 1  .  amLODipine (NORVASC) 10 MG tablet Take 1 tablet (10 mg total) by mouth daily. 90 tablet 3  . aspirin 81 MG tablet Take 81 mg by mouth daily.    [name] atorvastatin (LIPITOR) 10 MG tablet TAKE 1 TABLET EVERY DAY 90 tablet 1  . Blood Glucose Calibration (TRUE METRIX LEVEL 1) Low SOLN Use as directed. 1 each 0  . Blood Glucose Monitoring Suppl (TRUE METRIX AIR GLUCOSE METER) w/Device KIT 1 each by Does not apply route daily. Use as directed once a day 1 kit 0  . brimonidine-timolol (COMBIGAN) 0.2-0.5 % ophthalmic solution Place 1 drop into the right eye 2 (two) times daily.     . fish oil-omega-3 fatty acids 1000 MG capsule Take 2 g by mouth daily.      [name] glimepiride (AMARYL) 4 MG tablet TAKE 1 TABLET EVERY DAY WITH BREAKFAST 90 tablet  1  . glucose blood (TRUE METRIX BLOOD GLUCOSE TEST) test strip Use as directed once a day.  Dx code: E11.9 100 each 12  . losartan-hydrochlorothiazide (HYZAAR) 100-25 MG tablet TAKE 1 TABLET EVERY DAY 90 tablet 1  . Multiple Vitamin (MULTIVITAMIN) tablet Take 1 tablet by mouth daily.    . SitaGLIPtin-MetFORMIN HCl (JANUMET XR) [phone] MG TB24 Take 1 tablet by mouth daily. 90 tablet 1  . tamsulosin (FLOMAX) 0.4 MG CAPS capsule TAKE 1 CAPSULE (0.4 MG TOTAL) BY MOUTH DAILY. 90 capsule 3  . TRUEplus Lancets 30G MISC Use as directed once a day.  E11.9 100 each 1   No current facility-administered medications for this visit.    REVIEW OF SYSTEMS:   Constitutional: ( - ) fevers, ( - )  chills , ( - ) night sweats Eyes: ( - ) blurriness of vision, ( - ) double vision, ( - ) watery eyes Ears, nose, mouth, throat, and face: ( - ) mucositis, ( - ) sore throat Respiratory: ( - ) cough, ( - ) dyspnea, ( - ) wheezes Cardiovascular: ( - ) palpitation, ( - ) chest discomfort, ( - ) lower extremity swelling Gastrointestinal:  ( - ) nausea, ( - ) heartburn, ( - ) change in bowel habits Skin: ( - ) abnormal skin rashes Lymphatics: ( - ) new lymphadenopathy, ( - ) easy  bruising Neurological: ( - ) numbness, ( - ) tingling, ( - ) new weaknesses Behavioral/Psych: ( - ) mood change, ( - ) new changes  All other systems were reviewed with the patient and are negative.  PHYSICAL EXAMINATION: ECOG PERFORMANCE STATUS: 0 - Asymptomatic  Vitals:   10/26/19 0928  BP: (!) 155/81  Pulse: 73  Resp: 18  Temp: 97.7 F (36.5 C)  SpO2: 98%   Filed Weights   10/26/19 0928  Weight: 137 lb 6.4 oz (62.3 kg)    GENERAL: well appearing elderly Caucasian male, alert, no distress and comfortable SKIN: skin color, texture, turgor are normal, no rashes or significant lesions EYES: conjunctiva are pink and non-injected, sclera clear LUNGS: clear to auscultation and percussion with normal breathing effort HEART: regular rate & rhythm and no murmurs and no lower extremity edema Musculoskeletal: no cyanosis of digits and no clubbing  PSYCH: alert & oriented x 3, fluent speech NEURO: no focal motor/sensory deficits  LABORATORY DATA:  I have reviewed the data as listed  CMP Latest Ref Rng & Units 10/26/2019 10/15/2019 07/30/2019  Glucose 70 - 99 mg/dL 204(H) 208(H) 200(H)  BUN 8 - 23 mg/dL 23 24(H) 18  Creatinine 0.61 - 1.24 mg/dL 1.59(H) 1.29 1.46(H)  Sodium 135 - 145 mmol/L 143 140 143  Potassium 3.5 - 5.1 mmol/L 3.9 4.6 4.2  Chloride 98 - 111 mmol/L 102 103 103  CO2 22 - 32 mmol/L 29 32 28  Calcium 8.9 - 10.3 mg/dL 9.5 9.2 9.2  Total Protein 6.5 - 8.1 g/dL 7.2 6.4 6.7  Total Bilirubin 0.3 - 1.2 mg/dL 0.8 0.6 0.6  Alkaline Phos 38 - 126 U/L 101 89 94  AST 15 - 41 U/L 14(L) 12 15  ALT 0 - 44 U/L 16 13 17    CBC Latest Ref Rng & Units 10/26/2019 07/30/2019 04/23/2019  WBC 4.0 - 10.5 K/uL 9.2 10.0 8.6  Hemoglobin 13.0 - 17.0 g/dL 14.6 13.9 12.4(L)  Hematocrit 39.0 - 52.0 % 43.6 42.2 38.0(L)  Platelets 150 - 400 K/uL 234 224 221     Surgical Pathology  CASE: [id number]  PATIENT: Demetrios Mcgonagle  Bone Marrow Report   Clinical History: MGUS, left posterior iliac  crest, (ADC)   DIAGNOSIS:   BONE MARROW, ASPIRATE, CLOT, CORE:  - Plasma cell myeloma, see comment.  - No amyloid deposits.  - Minimal iron stores.   PERIPHERAL BLOOD:  - Normocytic anemia.   COMMENT:   The marrow is mildly hypercellular with increased monotypic plasma cells  (9% aspirate, 10-15% CD138 immunohistochemistry). There are no amyloid  deposits seen with Congo red, although there is a lack of larger vessels  in the core biopsy. The findings are consistent with plasma cell  myeloma.   MICROSCOPIC DESCRIPTION:   PERIPHERAL BLOOD SMEAR: There is a normocytic anemia with scattered  elliptocytes/ovalocytes. There is no rouleaux formation. Leukocytes  are present in normal numbers. Circulating plasma cells are not  identified. Platelets are present in normal numbers.   BONE MARROW ASPIRATE: Spicular cellular.  Erythroid precursors:Present in appropriate proportions. No  significant dysplasia.  Granulocytic precursors: Present in appropriate proportions. No  significant dysplasia. No increase in blasts.  Megakaryocytes: Present and largely unremarkable.  Lymphocytes/plasma cells: There is a mild increase in plasma cells (9%  by manual differential counts) with scattered atypical forms (large  forms, binucleation). Lymphocytes are not increased.   TOUCH PREPARATIONS: Similar to aspirate smears.   CLOT AND BIOPSY: The core biopsies and clot sections are mildly  hypercellular for age (6 to 30%). CD138 immunohistochemistry reveals  increased plasma cells (10 to 15%) scattered in small clusters. By  light chain in situ hybridization plasma cells are lambda restricted.  Myeloid and erythroid elements are present in appropriate proportions.  Megakaryocytes exhibit a spectrum of maturation without tight clusters.  There are few benign appearing lymphoid aggregates. Congo red is  negative for amyloid deposits.   IRON STAIN: Iron stains are performed on a bone  marrow aspirate or touch  imprint smear and section of clot. The controls stained appropriately.     Storage Iron: Minimal.    Ring Sideroblasts: Absent.   ADDITIONAL DATA/TESTING: Cytogenetics, including FISH for myeloma, was  ordered and will be reported separately.   CELL COUNT DATA:   Bone Marrow count performed on 500 cells shows:  Blasts:  0%  Myeloid: 47%  Promyelocytes: 0%  Erythroid:   23%  Myelocytes:  5%  Lymphocytes:  17%  Metamyelocytes:   0%  Plasma cells: 9%  Bands:  16%  Neutrophils:  22% M:E ratio:   2.04  Eosinophils:  4%  Basophils:   0%  Monocytes:   4%   Lab Data: CBC performed on 04/23/2019 shows:  WBC: 8.6 k/uL Neutrophils:  71%  Hgb: 12.4 g/dL Lymphocytes:  25%  HCT: 38.0 %  Monocytes:   3%  MCV: 92.7 fL  Eosinophils:  0%  RDW: 13.8 %  Basophils:   1%  PLT: 221 k/uL   GROSS DESCRIPTION:   A: Aspirate smear   B: The specimen is received in B-plus fixative and consists of a 21.0 x  12.0 x 5.0 mm aggregate of red-brown clotted blood. The specimen is  entirely submitted in cassette B.   C: The specimen is received in B-plus fixative and consists of 2 cores  of tan bone, measuring 0.6 and 0.9 cm in length by 0.2 cm in diameter.  The specimen is entirely submitted in cassette C. Craig [name] 04/25/2019)   Final Diagnosis performed by Vicente Males, MD.  Electronically signed  04/25/2019  Technical and / or Professional components  performed at [facility], [place] [address] [place], [place] [address].  Immunohistochemistry Technical component (if applicable) was performed  at [facility]. [address] [place],  [place],  [address].  IMMUNOHISTOCHEMISTRY DISCLAIMER (if applicable):  Some of these immunohistochemical stains may have been developed and the  performance characteristics determine by [facility]. Some  may not have been cleared or approved by  the U.S. Food and Drug  Administration. The FDA has determined that such clearance or approval  is not necessary. This test is used for clinical purposes. It should not  be regarded as investigational or for research. This laboratory is  certified under the [place]  (CLIA-88) as qualified to perform high complexity clinical laboratory  testing. The controls stained appropriately.   RADIOGRAPHIC STUDIES: No relevant radiographic studies.   ASSESSMENT & PLAN CORDELLE DAHMEN 86 y.o. male with medical history significant for CAD s/p PCI in 2012, DM type II, Prostate cancer s/p brachytherapy, and HTN who presents for follow up for his smoldering multiple myeloma.   On exam today [name] is doing quite well.  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  He continues to be at his baseline level of health with excellent dietary habits and frequent visits to the gym.  He has had no major changes since we last saw him.  Treatment of smoldering multiple myeloma is a developing area of hematology.  There have been clinical trials showing benefit in the treatment of intermediate to high risk SMM (J Clin Oncol. 2020 Apr 10;38(11):1126-1137).  The current treatment recommendation consists of lenalidomide 25 mg daily for 21 of 28 days.  This therapy is however best suited for younger patients with longer life expectancy is given the long time to progression the average smoldering multiple myeloma patient has.  Review of the literature shows that intermediate risk patients have a time to progression of approximately 5 years (Blood Cancer Journal 8, 59 (2018).  Given Mr. Zajkowski advanced age I would recommend close monitoring of his hematological parameters and creatinine/calcium to assure that his disease is not progressing.  I have shared with [name] these statistics and recommendations.   After detailed  discussion [name] was in agreement with continued monitoring of his smoldering multiple myeloma.  He acknowledged since he feels well, therefore chemotherapy would likely inhibit his physical activity and his overall wellbeing.  I am in strong agreement that holding on treatment at this time is a reasonable option.  #Smoldering Multiple Myeloma, Intermediate Risk --confirmed diagnosis based on the bone marrow biopsy, with plasma cells of 10-15% and no CRAB criteria. No evidence of amyloidosis on bone marrow stain.  --treatment of Smoldering multiple myeloma is a new idea and predominately consists of monotherapy lenalidomide. Using the [facility] 2018 20/20/20 guidelines, [name] is a borderline Intermediate Risk SMM.  --given his advance aged and lack of any CRAB criteria, I would recommend holding on treatment at this time with close clinical monitoring for progression. [name] was in agreement with close continued monitoring.  --RTC in 6 months time.   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  --  last PSA collected at [facility], found to be PSA 0.2.   All questions were answered. The patient knows to call the clinic with any problems, questions or concerns.  A total of more than 20 minutes were spent face-to-face with the patient during this encounter and over half of that time was spent on counseling and coordination of care as outlined above.   Ledell Peoples, MD Department of Hematology/Oncology [place] at [facility] Phone: [phone] Pager: [phone] Email: [email or web address]  10/30/2019 2:47 PM  Literature Support:  Rene Paci, Ron Parker AM, Buadi FK, Wylie Hail, Matous JV, Anderson DM, Emmons RV, Mahindra A, Wagner LI, Dhodapkar MV, Rajkumar SV. Randomized Trial of Lenalidomide Versus Observation in Smoldering Multiple Myeloma. J Clin Oncol. 2020 Apr 10;38(11):1126-1137. doi: 10.1200/JCO.19.01740. Epub 2019 Oct 25. PMID: 35456256; PMCID: [id number]. --Progression-free survival was significantly longer with lenalidomide compared with observation (hazard ratio, 0.28; 95% CI, 0.12 to 0.62; P = .002). One-, 2-, and 3-year progression-free survival was 98%, 93%, and 91% for the lenalidomide arm versus 89%, 76%, and 66% for the observation arm, respectively.  Lakshman, A., Rajkumar, S.V., Buadi, F.K. et al. Risk stratification of smoldering multiple myeloma incorporating revised IMWG diagnostic criteria. Blood Cancer Journal 8, 59 (2018). [email or web address] --The median TTP for low-, intermediate-, and high-risk groups were 110, 68, and 29 months, respectively (p?<?0.0001). BMPC%?>?20%, M-protein?>?2?g/dL, and FLCr?>?20 at diagnosis can be used to risk stratify patients with SMM. Patients with high-risk SMM need close follow-up and are candidates for clinical trials aiming to prevent progression.

## 2019-11-01 ENCOUNTER — Telehealth: Payer: Self-pay | Admitting: *Deleted

## 2019-11-01 NOTE — Telephone Encounter (Signed)
-----  Message from Orson Slick, MD sent at 10/30/2019  2:15 PM EDT ----- Please let [name] know that his Smoldering Multiple Myeloma labs look stable. His blood counts are great, but his creatinine is mildly elevated. Encourage hydration, particularly when patient is outside.  We will see him again in 3 months time.  [name]  ----- Message ----- From: Buel Ream, Lab In [place] Sent: 10/26/2019   9:23 AM EDT To: Orson Slick, MD

## 2019-11-01 NOTE — Telephone Encounter (Signed)
TCT patient regarding recent lab results. No answer but was able to leave vm message for pt to return call to [phone] at his earl;iest convenience

## 2019-11-06 DIAGNOSIS — Z8546 Personal history of malignant neoplasm of prostate: Secondary | ICD-10-CM | POA: Diagnosis not present

## 2019-11-08 ENCOUNTER — Telehealth: Payer: Self-pay | Admitting: *Deleted

## 2019-11-08 NOTE — Telephone Encounter (Signed)
-----  Message from Orson Slick, MD sent at 10/30/2019  2:15 PM EDT ----- Please let Mr. Conry know that his Smoldering Multiple Myeloma labs look stable. His blood counts are great, but his creatinine is mildly elevated. Encourage hydration, particularly when patient is outside.  We will see him again in 3 months time.  [name]  ----- Message ----- From: Buel Ream, Lab In [place] Sent: 10/26/2019   9:23 AM EDT To: Orson Slick, MD

## 2019-11-08 NOTE — Telephone Encounter (Signed)
TCT patient regarding his lab results. Spoke with patient and advised that his multiple myeloma panel is stable.  Also advised to increase his water intake as his creatinine was slightly elevated.  Patient voiced understanding. No questions or concerns.

## 2019-11-13 DIAGNOSIS — Z8546 Personal history of malignant neoplasm of prostate: Secondary | ICD-10-CM | POA: Diagnosis not present

## 2019-11-21 NOTE — Chronic Care Management (AMB) (Deleted)
Chronic Care Management Pharmacy  Name: RUGER SAXER  MRN: [id number] DOB: Aug 22, 1931  Chief Complaint/ HPI  [name],  86 y.o. , male presents for their Initial CCM visit with the clinical pharmacist via telephone due to COVID-19 Pandemic.  PCP : [name], DO  Their chronic conditions include: HTN, CAD, Gastritis, T2DM, Hyperlipidemia, osteoporosis, BPH, CKD  Office Visits: 10/15/19: Patient presented to Dr. Etter Sjogren for follow-up. BP in clinic 150/78, amlodipine increased to 10 mg daily.  09/10/19: Patient presented to Naaman Plummer, RN for AWV.    Consult Visit: 10/26/19: Patient presented to Dr. Lorenso Courier (oncology) for smoldering myeloma follow-up. Myeloma will continue with close monitoring. BP in clinic 155/81. 10/04/19: Patient presented to Dr. Hollie Salk (nephrology) for CKD 07/30/19: Patient presented to Dr. Lorenso Courier (oncology) for smoldering myeloma follow-up.  Docusate stopped (patient no longer taking). BP in clinic 179/74. 07/16/19: Patient presented to Dr. Edilia Bo (Ophthalmology) for glaucoma follow-up.  05/23/19: Patient presented to Dr. Raeford Razor (Sports Medicine) for osteoporosis follow-up.    Medications: Outpatient Encounter Medications as of 11/22/2019  Medication Sig  . Alcohol Swabs PADS Use as directed once a day  . amLODipine (NORVASC) 10 MG tablet Take 1 tablet (10 mg total) by mouth daily.  [name] aspirin 81 MG tablet Take 81 mg by mouth daily.  [name] atorvastatin (LIPITOR) 10 MG tablet TAKE 1 TABLET EVERY DAY  . Blood Glucose Calibration (TRUE METRIX LEVEL 1) Low SOLN Use as directed.  . Blood Glucose Monitoring Suppl (TRUE METRIX AIR GLUCOSE METER) w/Device KIT 1 each by Does not apply route daily. Use as directed once a day  . brimonidine-timolol (COMBIGAN) 0.2-0.5 % ophthalmic solution Place 1 drop into the right eye 2 (two) times daily.   . fish oil-omega-3 fatty acids 1000 MG capsule Take 2 g by mouth daily.    [name] glimepiride (AMARYL) 4 MG tablet TAKE 1 TABLET EVERY  DAY WITH BREAKFAST  . glucose blood (TRUE METRIX BLOOD GLUCOSE TEST) test strip Use as directed once a day.  Dx code: E11.9  . losartan-hydrochlorothiazide (HYZAAR) 100-25 MG tablet TAKE 1 TABLET EVERY DAY  . Multiple Vitamin (MULTIVITAMIN) tablet Take 1 tablet by mouth daily.  . SitaGLIPtin-MetFORMIN HCl (JANUMET XR) [phone] MG TB24 Take 1 tablet by mouth daily.  . tamsulosin (FLOMAX) 0.4 MG CAPS capsule TAKE 1 CAPSULE (0.4 MG TOTAL) BY MOUTH DAILY.  . TRUEplus Lancets 30G MISC Use as directed once a day.  E11.9   No facility-administered encounter medications on file as of 11/22/2019.     Current Diagnosis/Assessment:  Goals Addressed   None     Diabetes   Recent Relevant Labs: Lab Results  Component Value Date/Time   HGBA1C 8.8 (H) 10/15/2019 08:30 AM   HGBA1C 7.3 (H) 04/06/2019 09:34 AM   MICROALBUR 50.7 (H) 04/06/2019 09:34 AM   MICROALBUR 14.1 (H) 01/31/2019 08:03 AM     Checking BG: {CHL HP Blood Glucose Monitoring Frequency:[phone]}  Recent FBG Readings: Recent pre-meal BG readings: *** Recent 2hr PP BG readings:  *** Recent HS BG readings: ***  Patient has failed these meds in past: *** Patient is currently {CHL Controlled/Uncontrolled:[phone]} on the following medications:   Glimepiride 4 mg daily   Sitagliptin-metformin XR [phone] mg daily   Last diabetic Foot exam:  Lab Results  Component Value Date/Time   HMDIABEYEEXA No Retinopathy 08/28/2015 12:00 AM    Last diabetic Eye exam:  Lab Results  Component Value Date/Time   HMDIABFOOTEX done 10/23/2013 12:00 AM  We discussed: {CHL HP Upstream Pharmacy [phone]}  Plan  Continue {CHL HP Upstream Pharmacy [phone]} and  Hypertension   BP today is:  {CHL HP UPSTREAM Pharmacist BP ranges:[phone]}  Office blood pressures are  BP Readings from Last 3 Encounters:  10/26/19 (!) 155/81  10/15/19 (!) 150/78  07/30/19 (!) 179/74    Patient has failed these meds in the  past: *** Patient is currently {CHL Controlled/Uncontrolled:[phone]} on the following medications:   Amlodipine 10 mg daily   Losartan 100-25 mg daily  Patient checks BP at home {CHL HP BP Monitoring Frequency:[phone]}  Patient home BP readings are ranging: ***  We discussed {CHL HP Upstream Pharmacy [phone]}  Plan  Continue {CHL HP Upstream Pharmacy [phone]}   Hyperlipidemia   LDL goal < 70  Lipid Panel     Component Value Date/Time   CHOL 172 10/15/2019 0830   TRIG 141.0 10/15/2019 0830   HDL 37.20 (L) 10/15/2019 0830   LDLCALC 107 (H) 10/15/2019 0830    Hepatic Function Latest Ref Rng & Units 10/26/2019 10/15/2019 07/30/2019  Total Protein 6.5 - 8.1 g/dL 7.2 6.4 6.7  Albumin 3.5 - 5.0 g/dL 4.0 4.1 3.7  AST 15 - 41 U/L 14(L) 12 15  ALT 0 - 44 U/L 16 13 17   Alk Phosphatase 38 - 126 U/L 101 89 94  Total Bilirubin 0.3 - 1.2 mg/dL 0.8 0.6 0.6  Bilirubin, Direct 0.0 - 0.3 mg/dL - - -     The ASCVD Risk score (Ingram., et al., 2013) failed to calculate for the following reasons:   The 2013 ASCVD risk score is only valid for ages 72 to 38   Patient has failed these meds in past: *** Patient is currently {CHL Controlled/Uncontrolled:[phone]} on the following medications:   Aspirin 81 mg daily  Atorvastatin 10 mg   Fish Oil 2g daily  We discussed:  {CHL HP Upstream Pharmacy [phone]}  Plan  Continue {CHL HP Upstream Pharmacy YHCWC:[id number]}  Osteoporosis   Pathological fracture forearm (Noted 03/21/19)  Last DEXA Scan: n/a   T-Score femoral neck: n/a  T-Score total hip: n/a  T-Score lumbar spine: n/a  T-Score forearm radius: n/a  10-year probability of major osteoporotic fracture: n/a  10-year probability of hip fracture: n/a  No results found for: VD25OH   Patient {is;is not an osteoporosis candidate:23886}  Patient has failed these meds in past: *** Patient is currently {CHL  Controlled/Uncontrolled:[phone]} on the following medications: None  We discussed:  {Osteoporosis Counseling:23892}  Plan  Continue {CHL HP Upstream Pharmacy [phone]}  BPH   PSA  Date Value Ref Range Status  08/30/2016 9.32 (H) 0.10 - 4.00 ng/mL Final  02/28/2014 3.74 0.10 - 4.00 ng/mL Final  10/20/2009 3.53 0.10 - 4.00 ng/mL Final   Patient has failed these meds in past: *** Patient is currently {CHL Controlled/Uncontrolled:[phone]} on the following medications:   Tamsulosin 0.4 mg daily   We discussed:  ***  Plan  Continue {CHL HP Upstream Pharmacy [phone]}   Misc/OTC   Combigan 0.2-0.5% ophth 1 drop right eye BID  Multivitamin daily  We discussed:  ***  Plan  Continue {CHL HP Upstream Pharmacy OHYWV:[id number]}  Vaccines   Reviewed and discussed patient's vaccination history.    Immunization History  Administered Date(s) Administered  . Fluad Quad(high Dose 65+) 04/06/2019  . Influenza Split 03/05/2011, 04/24/2012  . Influenza Whole 02/27/2008, 03/04/2010  . Influenza, High Dose Seasonal PF 04/01/2017, 03/14/2018  . Influenza,inj,quad, With Preservative 03/07/2013  . Influenza-Unspecified  03/08/2015  . PFIZER SARS-COV-2 Vaccination 08/13/2019, 09/18/2019  . Pneumococcal Conjugate-13 08/26/2014  . Pneumococcal Polysaccharide-23 02/27/2008    Plan  Recommended patient receive *** vaccine in *** office/pharmacy.   Medication Management   Pt uses [facility] for all medications Uses pill box? {Yes or If no, why not?:20788} Pt endorses ***% compliance  We discussed: ***  Plan  {US Pharmacy GYFV:49449}    Follow up: *** month phone visit  ***

## 2019-11-21 NOTE — Chronic Care Management (AMB) (Signed)
Chronic Care Management Pharmacy  Name: [name]  MRN: [id number] DOB: [birth year]  Chief Complaint/ HPI  [name],  86 y.o. , male presents for their Initial CCM visit with the clinical pharmacist In office (scheduled for telephone visit, but appeared in office).  PCP : [name], DO  Their chronic conditions include: Diabetes, HTN, Hyperlipidemia/CAD, BPH, CKD  Office Visits: 10/15/19: Visit w/ Dr. Etter Sjogren -  BP in clinic 150/78, amlodipine increased to 10 mg daily and atorvastatin increased to 55m daily.  09/10/19: Medicare Annual Wellness Exam w/ VNaaman Plummer RN    Consult Visit: 10/26/19: Oncology visit w/ Dr. DLorenso Courier for smoldering myeloma follow-up. Myeloma will continue with close monitoring. BP in clinic 155/81.  10/04/19: Patient presented to Dr. UHollie Salk(nephrology) for CKD  07/30/19: Patient presented to Dr. DLorenso Courier(oncology) for smoldering myeloma follow-up.  Docusate stopped (patient no longer taking). BP in clinic 179/74.  07/16/19: Patient presented to Dr. BEdilia Bo(Ophthalmology) for glaucoma follow-up.   05/23/19: Patient presented to Dr. SRaeford Razor(Sports Medicine) for osteoporosis follow-up.   Medications: Outpatient Encounter Medications as of 11/22/2019  Medication Sig  . amLODipine (NORVASC) 10 MG tablet Take 1 tablet (10 mg total) by mouth daily.  .Marland Kitchenaspirin 81 MG tablet Take 81 mg by mouth daily.  . brimonidine-timolol (COMBIGAN) 0.2-0.5 % ophthalmic solution Place 1 drop into the right eye 2 (two) times daily.   . fish oil-omega-3 fatty acids 1000 MG capsule Take 2 g by mouth daily.    .Marland Kitchenlosartan-hydrochlorothiazide (HYZAAR) 100-25 MG tablet TAKE 1 TABLET EVERY Dmauri Rosenow  . Multiple Vitamin (MULTIVITAMIN) tablet Take 1 tablet by mouth daily.  . tamsulosin (FLOMAX) 0.4 MG CAPS capsule TAKE 1 CAPSULE (0.4 MG TOTAL) BY MOUTH DAILY.  .Marland KitchenAlcohol Swabs PADS Use as directed once a Jody Aguinaga  . atorvastatin (LIPITOR) 10 MG tablet TAKE 1 TABLET EVERY Kyri Dai  . Blood  Glucose Calibration (TRUE METRIX LEVEL 1) Low SOLN Use as directed.  . Blood Glucose Monitoring Suppl (TRUE METRIX AIR GLUCOSE METER) w/Device KIT 1 each by Does not apply route daily. Use as directed once a Kaylean Tupou  . glimepiride (AMARYL) 4 MG tablet TAKE 1 TABLET EVERY Marytza Grandpre WITH BREAKFAST  . glucose blood (TRUE METRIX BLOOD GLUCOSE TEST) test strip Use as directed once a Akai Dollard.  Dx code: E11.9  . SitaGLIPtin-MetFORMIN HCl (JANUMET XR) [phone] MG TB24 Take 1 tablet by mouth daily. (Patient not taking: Reported on 11/22/2019)  . TRUEplus Lancets 30G MISC Use as directed once a Shawana Knoch.  E11.9   No facility-administered encounter medications on file as of 11/22/2019.   SDOH Screenings   Alcohol Screen:   . Last Alcohol Screening Score (AUDIT):   Depression (PHQ2-9): Low Risk   . PHQ-2 Score: 0  Financial Resource Strain: Low Risk   . Difficulty of Paying Living Expenses: Not hard at all  Food Insecurity: No Food Insecurity  . Worried About RCharity fundraiserin the Last Year: Never true  . Ran Out of Food in the Last Year: Never true  Housing: Low Risk   . Last Housing Risk Score: 0  Physical Activity:   . Days of Exercise per Week:   . Minutes of Exercise per Session:   Social Connections:   . Frequency of Communication with Friends and Family:   . Frequency of Social Gatherings with Friends and Family:   . Attends Religious Services:   . Active Member of Clubs or Organizations:   .  Attends Archivist Meetings:   [name] Marital Status:   Stress:   . Feeling of Stress :   Tobacco Use: Medium Risk  . Smoking Tobacco Use: Former Smoker  . Smokeless Tobacco Use: Never Used  Transportation Needs: No Transportation Needs  . Lack of Transportation (Medical): No  . Lack of Transportation (Non-Medical): No     Current Diagnosis/Assessment:  Goals Addressed            This Visit's Progress   . Chronic Care Management Pharmacy Care Plan       CARE PLAN ENTRY (see longitudinal plan  of care for additional care plan information)  Current Barriers:  . Chronic Disease Management support, education, and care coordination needs related to Diabetes, HTN, Hyperlipidemia/CAD, BPH, CKD   Hypertension BP Readings from Last 3 Encounters:  11/22/19 (!) 164/63  10/26/19 (!) 155/81  10/15/19 (!) 150/78   . Pharmacist Clinical Goal(s): o Over the next 90 days, patient will work with PharmD and providers to achieve BP goal <140/90 . Current regimen:  o Amlodipine 10 mg daily, Losartan/HCTZ 100-25 mg daily  . Interventions: o Requested patient to check his blood pressure 3-4 times per week and record o Discussed BP goal and importance of monitoring BP . Patient self care activities - Over the next 90 days, patient will: o Check BP 3-4 times per week, document, and provide at future appointments o Ensure daily salt intake < 2300 mg/Ladye Macnaughton  Hyperlipidemia Lab Results  Component Value Date/Time   LDLCALC 107 (H) 10/15/2019 08:30 AM   . Pharmacist Clinical Goal(s): o Over the next 90 days, patient will work with PharmD and providers to achieve LDL goal < 70 . Current regimen:  o Atorvastatin 59m daily . Interventions: o Get refill of atorvastatin for patient  o Discussed importance of medication adherence  . Patient self care activities - Over the next 90 days, patient will: o Maintain cholesterol medication regimen.   Diabetes Lab Results  Component Value Date/Time   HGBA1C 8.8 (H) 10/15/2019 08:30 AM   HGBA1C 7.3 (H) 04/06/2019 09:34 AM   . Pharmacist Clinical Goal(s): o Over the next 90 days, patient will work with PharmD and providers to achieve A1c goal <7% . Current regimen:  o Glimepiride 450mdaily . Interventions: o Requested patient to check blood sugar daily and record o Collaboration with provider regarding medication management (Janumet titration) o Consider starting Janumet XR 50-1000 once daily for a week then increasing to Janumet XR 50-1000 twice  daily . Patient self care activities - Over the next 90 days, patient will: o Check blood sugar once daily, document, and provide at future appointments o Contact provider with any episodes of hypoglycemia  Osteoporosis Screening . Pharmacist Clinical Goal(s) o Over the next 90 days, patient will work with PharmD and providers to reduce risk of fracture due to osteopenia/osteoporosis . Current regimen:  o None . Interventions: o Consider daily intake of 120058mf calcium daily through diet or supplementation o Consider daily intake of [phone] units of vitamin D through supplementation  o Consider getting DEXA (bone density scan) for further assessment . Patient self care activities - Over the next 90 days, patient will: o Increase intake to 1200m60m calcium daily through diet and/or supplementation o Increase intake to [phone] units of vitamin D daily through supplementation o Complete DEXA Scan per Dr. LownEtter Sjogrendication management . Pharmacist Clinical Goal(s): o Over the next 90 days, patient will work with PharmD  and providers to achieve optimal medication adherence . Current pharmacy: [facility] . Interventions o Comprehensive medication review performed. o Continue current medication management strategy . Patient self care activities - Over the next 90 days, patient will: o Focus on medication adherence by filling and taking medications appropriately  o Take medications as prescribed o Report any questions or concerns to PharmD and/or provider(s)  Initial goal documentation      Social Hx: Wife passed away in July 28, 2014. He loves gardening, especially tomatoes.  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  He has 3 sons and 4 grandchildren. All of his children are successful and he is very proud of them.   Diabetes   Recent Relevant Labs: Lab Results  Component Value Date/Time   HGBA1C 8.8 (H) 10/15/2019 08:30 AM   HGBA1C 7.3 (H) 04/06/2019  09:34 AM   MICROALBUR 50.7 (H) 04/06/2019 09:34 AM   MICROALBUR 14.1 (H) 01/31/2019 08:03 AM     Checking BG: Rarely  Recent FBG Readings: Unable to assess  Patient has failed these meds in past: None noted  Patient is currently uncontrolled on the following medications:   Glimepiride 4 mg daily   Sitagliptin-metformin XR [phone] mg daily (not taking)  Glimepiride and sitagliptin-metformin not in dispense report. Patient does not recall having these medications at home.  Called patient back after his office visit and he confirms he is not taking Janumet. States he takes glimepiride even though it is not listed in dispense report.  He states pricking his fingers hurts and this limits him from checking his BG.   Interested in getting CGM.   Denies any polyuria, polydipsia, or polyphagia.   Diet Lots of fish and greens Drinks lots of water (attributes this to him not having a headache in over 10 years)  Last diabetic Eye exam:  Lab Results  Component Value Date/Time   HMDIABEYEEXA No Retinopathy 08/28/2015 12:00 AM    Last diabetic Foot exam:  Lab Results  Component Value Date/Time   HMDIABFOOTEX done 10/23/2013 12:00 AM    Assessed patient's feet. There are no blisters or calluses. Did not use monofilament for full foot exam.   We discussed: importance of diabetes control and medication adherence  Plan -Check fasting blood sugar daily and record -Consider starting Janumet 50-1023m once daily for 1 week, then increase to 1 tablet twice daily -Research options for CGM (Dexcom vs Freestyle Libre)  Hypertension   CMP Latest Ref Rng & Units 10/26/2019 10/15/2019 07/30/2019  Glucose 70 - 99 mg/dL 204(H) 208(H) 200(H)  BUN 8 - 23 mg/dL 23 24(H) 18  Creatinine 0.61 - 1.24 mg/dL 1.59(H) 1.29 1.46(H)  Sodium 135 - 145 mmol/L 143 140 143  Potassium 3.5 - 5.1 mmol/L 3.9 4.6 4.2  Chloride 98 - 111 mmol/L 102 103 103  CO2 22 - 32 mmol/L 29 32 28  Calcium 8.9 - 10.3 mg/dL 9.5  9.2 9.2  Total Protein 6.5 - 8.1 g/dL 7.2 6.4 6.7  Total Bilirubin 0.3 - 1.2 mg/dL 0.8 0.6 0.6  Alkaline Phos 38 - 126 U/L 101 89 94  AST 15 - 41 U/L 14(L) 12 15  ALT 0 - 44 U/L _0 Kidney Function Lab Results  Component Value Date/Time   CREATININE 1.59 (H) 10/26/2019 08:54 AM   CREATININE 1.29 10/15/2019 08:30 AM   CREATININE 1.46 (H) 07/30/2019 08:07 AM   GFR 52.85 (L) 10/15/2019 08:30 AM   GFRNONAA 39 (  L) 10/26/2019 08:54 AM   GFRAA 45 (L) 10/26/2019 08:54 AM   K 3.9 10/26/2019 08:54 AM   K 4.6 10/15/2019 08:30 AM   BP today is:  >140/90 as noted below  Office blood pressures are  BP Readings from Last 3 Encounters:  11/22/19 (!) 164/63  10/26/19 (!) 155/81  10/15/19 (!) 150/78   BP goal <140/90  Patient feels he has white coat syndrome  Patient has failed these meds in the past: None noted  Patient is currently uncontrolled on the following medications:   Amlodipine 10 mg daily   Losartan/HCTZ 100-25 mg daily   Per Dispense Report Amlodipine: 10/15/19 - 90 DS Losartan/hctz: 06/28/19 - 90DS  Patient checks BP at home infrequently   Amlodipine 45m: 94 tabs remaining at home per patient Losartan/hctz 100-255m 50 tabs remaining remaining at home per patient  [facility]  Patient home BP readings are ranging: Unable to assess  We discussed importance of medication adherence and monitoring BP as well as BP goal  Plan -Check BP 3-4 times per week and record -Continue current medications   Hyperlipidemia/CAD   LDL goal < 70  Lipid Panel     Component Value Date/Time   CHOL 172 10/15/2019 0830   TRIG 141.0 10/15/2019 0830   HDL 37.20 (L) 10/15/2019 0830   LDLCALC 107 (H) 10/15/2019 0830    Hepatic Function Latest Ref Rng & Units 10/26/2019 10/15/2019 07/30/2019  Total Protein 6.5 - 8.1 g/dL 7.2 6.4 6.7  Albumin 3.5 - 5.0 g/dL 4.0 4.1 3.7  AST 15 - 41 U/L 14(L) 12 15  ALT 0 - 44 U/L _0 Alk  Phosphatase 38 - 126 U/L 101 89 94  Total Bilirubin 0.3 - 1.2 mg/dL 0.8 0.6 0.6  Bilirubin, Direct 0.0 - 0.3 mg/dL - - -     The ASCVD Risk score (GoCasselman et al., 2013) failed to calculate for the following reasons:   The 2013 ASCVD risk score is only valid for ages 4029o 7923 Patient has failed these meds in past: simvastatin (intxn with amlodipine) Patient is currently uncontrolled on the following medications:   Aspirin 81 mg daily  Atorvastatin 10 mg daily  Fish Oil 2g daily  Atorvastatin not listed in dispense report  Patient reports not having this at home.  We discussed:  importance of medication adherence and LDL goal noting hx of CAD  Plan -Get refill of atorvastatin sent into pharmacy so patient can resume therapy -Continue current medications  Osteoporosis Screening   Pathological fracture forearm (Noted 03/21/19)  Last DEXA Scan: n/a   T-Score femoral neck: n/a  T-Score total hip: n/a  T-Score lumbar spine: n/a  T-Score forearm radius: n/a  10-year probability of major osteoporotic fracture: n/a  10-year probability of hip fracture: n/a  No results found for: VD25OH   Patient has failed these meds in past: None noted  Patient is currently uncontrolled on the following medications: None  fractured wrist caring for germ shephard  We discussed:  Recommend [phone] units of vitamin D daily. Recommend 1200 mg of calcium daily from dietary and supplemental sources.  Plan -Consider supplementation of calcium and vitamin D to get recommended daily values noted above -Consider DEXA Scan to determine need for further pharmacological treatment   BPH   PSA  Date Value Ref Range Status  08/30/2016 9.32 (H) 0.10 - 4.00 ng/mL Final  02/28/2014 3.74 0.10 - 4.00  ng/mL Final  10/20/2009 3.53 0.10 - 4.00 ng/mL Final  Patient states his PSA was 1.1 2 weeks prior at Dr. Keane Scrape office at [facility] urology  Patient has failed these meds in past: None noted  Patient  is currently controlled per patient report of most recent PSA on the following medications:   Tamsulosin 0.4 mg daily  Followed by urology Stream not as good, not smooth, less bubbles, good emptying  Plan -Continue current medications   Misc/OTC   Combigan 0.2-0.5% ophth 1 drop right eye BID  Multivitamin daily  Plan -Continue current medications  Vaccines   Reviewed and discussed patient's vaccination history.    Immunization History  Administered Date(s) Administered  . Fluad Quad(high Dose 65+) 04/06/2019  . Influenza Split 03/05/2011, 04/24/2012  . Influenza Whole 02/27/2008, 03/04/2010  . Influenza, High Dose Seasonal PF 04/01/2017, 03/14/2018  . Influenza,inj,quad, With Preservative 03/07/2013  . Influenza-Unspecified 03/08/2015  . PFIZER SARS-COV-2 Vaccination 08/13/2019, 09/18/2019  . Pneumococcal Conjugate-13 08/26/2014  . Pneumococcal Polysaccharide-23 02/27/2008    Plan -Recommended patient receive Shingrix vaccine in pharmacy.   Medication Management   Pt uses [facility] for most medications Uses pill box? No - doesn't like pill boxes Pt endorses 100% compliance  We discussed: Importance of medication adherence.   Plan Continue current medication management strategy

## 2019-11-22 ENCOUNTER — Other Ambulatory Visit: Payer: Self-pay

## 2019-11-22 ENCOUNTER — Ambulatory Visit: Payer: Medicare HMO | Admitting: Pharmacist

## 2019-11-22 VITALS — BP 164/63 | HR 72 | Wt 137.0 lb

## 2019-11-22 DIAGNOSIS — I10 Essential (primary) hypertension: Secondary | ICD-10-CM

## 2019-11-22 DIAGNOSIS — E1136 Type 2 diabetes mellitus with diabetic cataract: Secondary | ICD-10-CM

## 2019-11-22 DIAGNOSIS — E1169 Type 2 diabetes mellitus with other specified complication: Secondary | ICD-10-CM

## 2019-11-28 ENCOUNTER — Other Ambulatory Visit: Payer: Self-pay

## 2019-11-28 DIAGNOSIS — E1136 Type 2 diabetes mellitus with diabetic cataract: Secondary | ICD-10-CM

## 2019-11-28 DIAGNOSIS — I10 Essential (primary) hypertension: Secondary | ICD-10-CM

## 2019-11-28 DIAGNOSIS — E785 Hyperlipidemia, unspecified: Secondary | ICD-10-CM

## 2019-11-29 ENCOUNTER — Ambulatory Visit: Payer: Medicare HMO | Admitting: Pharmacist

## 2019-11-29 ENCOUNTER — Other Ambulatory Visit: Payer: Self-pay

## 2019-11-29 DIAGNOSIS — E785 Hyperlipidemia, unspecified: Secondary | ICD-10-CM

## 2019-11-29 DIAGNOSIS — E1169 Type 2 diabetes mellitus with other specified complication: Secondary | ICD-10-CM

## 2019-11-29 DIAGNOSIS — E1165 Type 2 diabetes mellitus with hyperglycemia: Secondary | ICD-10-CM

## 2019-11-29 DIAGNOSIS — I10 Essential (primary) hypertension: Secondary | ICD-10-CM

## 2019-11-29 NOTE — Patient Instructions (Addendum)
Visit Information  Goals Addressed            This Visit's Progress    Chronic Care Management Pharmacy Care Plan       CARE PLAN ENTRY (see longitudinal plan of care for additional care plan information)  Current Barriers:   Chronic Disease Management support, education, and care coordination needs related to Diabetes, HTN, Hyperlipidemia/CAD, BPH, CKD   Hypertension BP Readings from Last 3 Encounters:  11/22/19 (!) 164/63  10/26/19 (!) 155/81  10/15/19 (!) 150/78    Pharmacist Clinical Goal(s): o Over the next 90 days, patient will work with PharmD and providers to achieve BP goal <140/90  Current regimen:  o Amlodipine 10 mg daily, Losartan/HCTZ 100-25 mg daily   Interventions: o Requested patient to check his blood pressure 3-4 times per week and record o Discussed BP goal and importance of monitoring BP  Patient self care activities - Over the next 90 days, patient will: o Check BP 3-4 times per week, document, and provide at future appointments o Ensure daily salt intake < 2300 mg/[name]  Hyperlipidemia Lab Results  Component Value Date/Time   LDLCALC 107 (H) 10/15/2019 08:30 AM    Pharmacist Clinical Goal(s): o Over the next 90 days, patient will work with PharmD and providers to achieve LDL goal < 70  Current regimen:  o Atorvastatin 20mg  daily  Interventions: o Get refill of atorvastatin for patient  o Discussed importance of medication adherence   Patient self care activities - Over the next 90 days, patient will: o Maintain cholesterol medication regimen.   Diabetes Lab Results  Component Value Date/Time   HGBA1C 8.8 (H) 10/15/2019 08:30 AM   HGBA1C 7.3 (H) 04/06/2019 09:34 AM    Pharmacist Clinical Goal(s): o Over the next 90 days, patient will work with PharmD and providers to achieve A1c goal <7%  Current regimen:  o Glimepiride 4mg  daily  Interventions: o Requested patient to check blood sugar daily and record o Collaboration with  provider regarding medication management (Janumet titration) o Consider starting Janumet XR 50-1000 once daily for a week then increasing to Janumet XR 50-1000 twice daily  Patient self care activities - Over the next 90 days, patient will: o Check blood sugar once daily, document, and provide at future appointments o Contact provider with any episodes of hypoglycemia  Osteoporosis Screening  Pharmacist Clinical Goal(s) o Over the next 90 days, patient will work with PharmD and providers to reduce risk of fracture due to osteopenia/osteoporosis  Current regimen:  o None  Interventions: o Consider daily intake of 1200mg  of calcium daily through diet or supplementation o Consider daily intake of [phone] units of vitamin D through supplementation  o Consider getting DEXA (bone density scan) for further assessment  Patient self care activities - Over the next 90 days, patient will: o Increase intake to 1200mg  of calcium daily through diet and/or supplementation o Increase intake to [phone] units of vitamin D daily through supplementation o Complete DEXA Scan per Dr. Etter [name]  Medication management  Pharmacist Clinical Goal(s): o Over the next 90 days, patient will work with PharmD and providers to achieve optimal medication adherence  Current pharmacy: [facility]  Interventions o Comprehensive medication review performed. o Continue current medication management strategy  Patient self care activities - Over the next 90 days, patient will: o Focus on medication adherence by filling and taking medications appropriately  o Take medications as prescribed o Report any questions or concerns to PharmD and/or provider(s)  Please see past updates related to this goal by clicking on the "Past Updates" button in the selected goal         [name] was given information about Chronic Care Management services today including:  1. CCM service includes personalized support from designated  clinical staff supervised by his physician, including individualized plan of care and coordination with other care providers 2. 24/7 contact phone numbers for assistance for urgent and routine care needs. 3. Standard insurance, coinsurance, copays and deductibles apply for chronic care management only during months in which we provide at least 20 minutes of these services. Most insurances cover these services at 100%, however patients may be responsible for any copay, coinsurance and/or deductible if applicable. This service may help you avoid the need for more expensive face-to-face services. 4. Only one practitioner may furnish and bill the service in a calendar month. 5. The patient may stop CCM services at any time (effective at the end of the month) by phone call to the office staff.  Patient agreed to services and verbal consent obtained.   The patient verbalized understanding of instructions provided today and agreed to receive a mailed copy of patient instruction and/or educational materials. Telephone follow up appointment with pharmacy team member scheduled for: 12/11/2019  Melvenia Beam [name], PharmD Clinical Pharmacist [facility] [phone]    Carbohydrate Counting for Diabetes Mellitus, Adult  Carbohydrate counting is a method of keeping track of how many carbohydrates you eat. Eating carbohydrates naturally increases the amount of sugar (glucose) in the blood. Counting how many carbohydrates you eat helps keep your blood glucose within normal limits, which helps you manage your diabetes (diabetes mellitus). It is important to know how many carbohydrates you can safely have in each meal. This is different for every person. A diet and nutrition specialist (registered dietitian) can help you make a meal plan and calculate how many carbohydrates you should have at each meal and snack. Carbohydrates are found in the following foods:  Grains, such as breads  and cereals.  Dried beans and soy products.  Starchy vegetables, such as potatoes, peas, and corn.  Fruit and fruit juices.  Milk and yogurt.  Sweets and snack foods, such as cake, cookies, candy, chips, and soft drinks. How do I count carbohydrates? There are two ways to count carbohydrates in food. You can use either of the methods or a combination of both. Reading "Nutrition Facts" on packaged food The "Nutrition Facts" list is included on the labels of almost all packaged foods and beverages in the [place] It includes:  The serving size.  Information about nutrients in each serving, including the grams (g) of carbohydrate per serving. To use the Nutrition Facts":  Decide how many servings you will have.  Multiply the number of servings by the number of carbohydrates per serving.  The resulting number is the total amount of carbohydrates that you will be having. Learning standard serving sizes of other foods When you eat carbohydrate foods that are not packaged or do not include "Nutrition Facts" on the label, you need to measure the servings in order to count the amount of carbohydrates:  Measure the foods that you will eat with a food scale or measuring cup, if needed.  Decide how many standard-size servings you will eat.  Multiply the number of servings by 15. Most carbohydrate-rich foods have about 15 g of carbohydrates per serving. ? For example, if you eat 8 oz (170 g) of  strawberries, you will have eaten 2 servings and 30 g of carbohydrates (2 servings x 15 g = 30 g).  For foods that have more than one food mixed, such as soups and casseroles, you must count the carbohydrates in each food that is included. The following list contains standard serving sizes of common carbohydrate-rich foods. Each of these servings has about 15 g of carbohydrates:   hamburger bun or  English muffin.   oz (15 mL) syrup.   oz (14 g) jelly.  1 slice of bread.  1 six-inch  tortilla.  3 oz (85 g) cooked rice or pasta.  4 oz (113 g) cooked dried beans.  4 oz (113 g) starchy vegetable, such as peas, corn, or potatoes.  4 oz (113 g) hot cereal.  4 oz (113 g) mashed potatoes or  of a large baked potato.  4 oz (113 g) canned or frozen fruit.  4 oz (120 mL) fruit juice.  4-6 crackers.  6 chicken nuggets.  6 oz (170 g) unsweetened dry cereal.  6 oz (170 g) plain fat-free yogurt or yogurt sweetened with artificial sweeteners.  8 oz (240 mL) milk.  8 oz (170 g) fresh fruit or one small piece of fruit.  24 oz (680 g) popped popcorn. Example of carbohydrate counting Sample meal  3 oz (85 g) chicken breast.  6 oz (170 g) brown rice.  4 oz (113 g) corn.  8 oz (240 mL) milk.  8 oz (170 g) strawberries with sugar-free whipped topping. Carbohydrate calculation 1. Identify the foods that contain carbohydrates: ? Rice. ? Corn. ? Milk. ? Strawberries. 2. Calculate how many servings you have of each food: ? 2 servings rice. ? 1 serving corn. ? 1 serving milk. ? 1 serving strawberries. 3. Multiply each number of servings by 15 g: ? 2 servings rice x 15 g = 30 g. ? 1 serving corn x 15 g = 15 g. ? 1 serving milk x 15 g = 15 g. ? 1 serving strawberries x 15 g = 15 g. 4. Add together all of the amounts to find the total grams of carbohydrates eaten: ? 30 g + 15 g + 15 g + 15 g = 75 g of carbohydrates total. Summary  Carbohydrate counting is a method of keeping track of how many carbohydrates you eat.  Eating carbohydrates naturally increases the amount of sugar (glucose) in the blood.  Counting how many carbohydrates you eat helps keep your blood glucose within normal limits, which helps you manage your diabetes.  A diet and nutrition specialist (registered dietitian) can help you make a meal plan and calculate how many carbohydrates you should have at each meal and snack. This information is not intended to replace advice given to you by  your health care provider. Make sure you discuss any questions you have with your health care provider. Document Revised: 12/16/2016 Document Reviewed: 11/05/2015 Elsevier Patient Education  [place].

## 2019-11-29 NOTE — Patient Instructions (Addendum)
Visit Information  Goals Addressed            This Visit's Progress   . Chronic Care Management Pharmacy Care Plan       CARE PLAN ENTRY (see longitudinal plan of care for additional care plan information)  Current Barriers:  . Chronic Disease Management support, education, and care coordination needs related to Diabetes, HTN, Hyperlipidemia/CAD, BPH, CKD   Hypertension BP Readings from Last 3 Encounters:  11/22/19 (!) 164/63  10/26/19 (!) 155/81  10/15/19 (!) 150/78   . Pharmacist Clinical Goal(s): o Over the next 90 days, patient will work with PharmD and providers to achieve BP goal <140/90 . Current regimen:  o Amlodipine 10 mg daily, Losartan/HCTZ 100-25 mg daily  . Interventions: o Requested patient to check his blood pressure 3-4 times per week and record o Discussed BP goal and importance of monitoring BP . Patient self care activities - Over the next 90 days, patient will: o Check BP 3-4 times per week, document, and provide at future appointments o Ensure daily salt intake < 2300 mg/[name]  Hyperlipidemia Lab Results  Component Value Date/Time   LDLCALC 107 (H) 10/15/2019 08:30 AM   . Pharmacist Clinical Goal(s): o Over the next 90 days, patient will work with PharmD and providers to achieve LDL goal < 70 . Current regimen:  o Atorvastatin 10mg  daily . Interventions: o Get refill of atorvastatin for patient  o Discussed importance of medication adherence  . Patient self care activities - Over the next 90 days, patient will: o Maintain cholesterol medication regimen.   Diabetes Lab Results  Component Value Date/Time   HGBA1C 8.8 (H) 10/15/2019 08:30 AM   HGBA1C 7.3 (H) 04/06/2019 09:34 AM   . Pharmacist Clinical Goal(s): o Over the next 90 days, patient will work with PharmD and providers to achieve A1c goal <7% . Current regimen:  o Glimepiride 4mg  daily . Interventions: o Requested patient to check blood sugar daily and record o Collaboration with  provider regarding medication management (Janumet titration) o Consider starting Janumet XR 50-1000 once daily for a week then increasing to Janumet XR 50-1000 twice daily . Patient self care activities - Over the next 90 days, patient will: o Check blood sugar once daily, document, and provide at future appointments o Contact provider with any episodes of hypoglycemia  Osteoporosis Screening . Pharmacist Clinical Goal(s) o Over the next 90 days, patient will work with PharmD and providers to reduce risk of fracture due to osteopenia/osteoporosis . Current regimen:  o None . Interventions: o Consider daily intake of 1200mg  of calcium daily through diet or supplementation o Consider daily intake of [phone] units of vitamin D through supplementation  o Consider getting DEXA (bone density scan) for further assessment . Patient self care activities - Over the next 90 days, patient will: o Increase intake to 1200mg  of calcium daily through diet and/or supplementation o Increase intake to [phone] units of vitamin D daily through supplementation o Complete DEXA Scan per Dr. Etter Sjogren  Medication management . Pharmacist Clinical Goal(s): o Over the next 90 days, patient will work with PharmD and providers to achieve optimal medication adherence . Current pharmacy: [facility] . Interventions o Comprehensive medication review performed. o Continue current medication management strategy . Patient self care activities - Over the next 90 days, patient will: o Focus on medication adherence by filling and taking medications appropriately  o Take medications as prescribed o Report any questions or concerns to PharmD and/or  provider(s)  Initial goal documentation        [name] was given information about Chronic Care Management services today including:  1. CCM service includes personalized support from designated clinical staff supervised by his physician, including individualized plan  of care and coordination with other care providers 2. 24/7 contact phone numbers for assistance for urgent and routine care needs. 3. Standard insurance, coinsurance, copays and deductibles apply for chronic care management only during months in which we provide at least 20 minutes of these services. Most insurances cover these services at 100%, however patients may be responsible for any copay, coinsurance and/or deductible if applicable. This service may help you avoid the need for more expensive face-to-face services. 4. Only one practitioner may furnish and bill the service in a calendar month. 5. The patient may stop CCM services at any time (effective at the end of the month) by phone call to the office staff.  Patient agreed to services and verbal consent obtained.   The patient verbalized understanding of instructions provided today and agreed to receive a mailed copy of patient instruction and/or educational materials. Telephone follow up appointment with pharmacy team member scheduled for: 11/29/2019  Melvenia Beam [name], PharmD Clinical Pharmacist [facility] [phone]

## 2019-11-29 NOTE — Chronic Care Management (AMB) (Signed)
Chronic Care Management Pharmacy  Name: [name]  MRN: [id number] DOB: 04-22-1932  Chief Complaint/ HPI  [name],  86 y.o. , male presents for their Follow-Up CCM visit with the clinical pharmacist via telephone due to COVID-19 Pandemic   PCP : [name], DO  Their chronic conditions include: Diabetes, HTN, Hyperlipidemia/CAD, BPH, CKD  Office Visits: None since last CCM visit on 11/22/19.   Consult Visit: None since last CCM visit on 11/22/19.   Medications: Outpatient Encounter Medications as of 11/29/2019  Medication Sig   Alcohol Swabs PADS Use as directed once a [name]   amLODipine (NORVASC) 10 MG tablet Take 1 tablet (10 mg total) by mouth daily.   aspirin 81 MG tablet Take 81 mg by mouth daily.   atorvastatin (LIPITOR) 10 MG tablet TAKE 1 TABLET EVERY [name]   Blood Glucose Calibration (TRUE METRIX LEVEL 1) Low SOLN Use as directed.   Blood Glucose Monitoring Suppl (TRUE METRIX AIR GLUCOSE METER) w/Device KIT 1 each by Does not apply route daily. Use as directed once a [name]   brimonidine-timolol (COMBIGAN) 0.2-0.5 % ophthalmic solution Place 1 drop into the right eye 2 (two) times daily.    fish oil-omega-3 fatty acids 1000 MG capsule Take 2 g by mouth daily.     glimepiride (AMARYL) 4 MG tablet TAKE 1 TABLET EVERY [name] WITH BREAKFAST   glucose blood (TRUE METRIX BLOOD GLUCOSE TEST) test strip Use as directed once a [name].  Dx code: E11.9   losartan-hydrochlorothiazide (HYZAAR) 100-25 MG tablet TAKE 1 TABLET EVERY [name]   Multiple Vitamin (MULTIVITAMIN) tablet Take 1 tablet by mouth daily.   SitaGLIPtin-MetFORMIN HCl (JANUMET XR) [phone] MG TB24 Take 1 tablet by mouth daily. (Patient not taking: Reported on 11/22/2019)   tamsulosin (FLOMAX) 0.4 MG CAPS capsule TAKE 1 CAPSULE (0.4 MG TOTAL) BY MOUTH DAILY.   TRUEplus Lancets 30G MISC Use as directed once a [name].  E11.9   No facility-administered encounter medications on file as of 11/29/2019.   SDOH  Screenings   Alcohol Screen:    Last Alcohol Screening Score (AUDIT):   Depression (PHQ2-9): Low Risk    PHQ-2 Score: 0  Financial Resource Strain: Low Risk    Difficulty of Paying Living Expenses: Not hard at all  Food Insecurity: No Food Insecurity   Worried About Charity fundraiser in the Last Year: Never true   Ran Out of Food in the Last Year: Never true  Housing: Low Risk    Last Housing Risk Score: 0  Physical Activity:    Days of Exercise per Week:    Minutes of Exercise per Session:   Social Connections:    Frequency of Communication with Friends and Family:    Frequency of Social Gatherings with Friends and Family:    Attends Religious Services:    Active Member of Clubs or Organizations:    Attends Music therapist:    Marital Status:   Stress:    Feeling of Stress :   Tobacco Use: Medium Risk   Smoking Tobacco Use: Former Smoker   Smokeless Tobacco Use: Never Used  Transportation Needs: No Data processing manager (Medical): No   Lack of Transportation (Non-Medical): No     Current Diagnosis/Assessment:  Goals Addressed            This Visit's Progress    Chronic Care Management Pharmacy Care Plan       CARE PLAN  ENTRY (see longitudinal plan of care for additional care plan information)  Current Barriers:   Chronic Disease Management support, education, and care coordination needs related to Diabetes, HTN, Hyperlipidemia/CAD, BPH, CKD   Hypertension BP Readings from Last 3 Encounters:  11/22/19 (!) 164/63  10/26/19 (!) 155/81  10/15/19 (!) 150/78    Pharmacist Clinical Goal(s): o Over the next 90 days, patient will work with PharmD and providers to achieve BP goal <140/90  Current regimen:  o Amlodipine 10 mg daily, Losartan/HCTZ 100-25 mg daily   Interventions: o Requested patient to check his blood pressure 3-4 times per week and record o Discussed BP goal and importance of monitoring  BP  Patient self care activities - Over the next 90 days, patient will: o Check BP 3-4 times per week, document, and provide at future appointments o Ensure daily salt intake < 2300 mg/[name]  Hyperlipidemia Lab Results  Component Value Date/Time   LDLCALC 107 (H) 10/15/2019 08:30 AM    Pharmacist Clinical Goal(s): o Over the next 90 days, patient will work with PharmD and providers to achieve LDL goal < 70  Current regimen:  o Atorvastatin 40m daily  Interventions: o Get refill of atorvastatin for patient  o Discussed importance of medication adherence   Patient self care activities - Over the next 90 days, patient will: o Maintain cholesterol medication regimen.   Diabetes Lab Results  Component Value Date/Time   HGBA1C 8.8 (H) 10/15/2019 08:30 AM   HGBA1C 7.3 (H) 04/06/2019 09:34 AM    Pharmacist Clinical Goal(s): o Over the next 90 days, patient will work with PharmD and providers to achieve A1c goal <7%  Current regimen:  o Glimepiride 425mdaily  Interventions: o Requested patient to check blood sugar daily and record o Collaboration with provider regarding medication management (Janumet titration) o Consider starting Janumet XR 50-1000 once daily for a week then increasing to Janumet XR 50-1000 twice daily  Patient self care activities - Over the next 90 days, patient will: o Check blood sugar once daily, document, and provide at future appointments o Contact provider with any episodes of hypoglycemia  Osteoporosis Screening  Pharmacist Clinical Goal(s) o Over the next 90 days, patient will work with PharmD and providers to reduce risk of fracture due to osteopenia/osteoporosis  Current regimen:  o None  Interventions: o Consider daily intake of 120061mf calcium daily through diet or supplementation o Consider daily intake of [phone] units of vitamin D through supplementation  o Consider getting DEXA (bone density scan) for further assessment  Patient  self care activities - Over the next 90 days, patient will: o Increase intake to 1200m43m calcium daily through diet and/or supplementation o Increase intake to [phone] units of vitamin D daily through supplementation o Complete DEXA Scan per Dr. LownEtter Sjogrendication management  Pharmacist Clinical Goal(s): o Over the next 90 days, patient will work with PharmD and providers to achieve optimal medication adherence  Current pharmacy: [facility]  Interventions o Comprehensive medication review performed. o Continue current medication management strategy  Patient self care activities - Over the next 90 days, patient will: o Focus on medication adherence by filling and taking medications appropriately  o Take medications as prescribed o Report any questions or concerns to PharmD and/or provider(s)  Please see past updates related to this goal by clicking on the "Past Updates" button in the selected goal       Social Hx: Wife passed away in 2016Mar 10, 2016 loves  gardening, especially tomatoes.  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  He has 3 sons and 4 grandchildren. All of his children are successful and he is very proud of them.   Diabetes   Recent Relevant Labs: Lab Results  Component Value Date/Time   HGBA1C 8.8 (H) 10/15/2019 08:30 AM   HGBA1C 7.3 (H) 04/06/2019 09:34 AM   MICROALBUR 50.7 (H) 04/06/2019 09:34 AM   MICROALBUR 14.1 (H) 01/31/2019 08:03 AM    From 11/22/19 CCM Visit  Checking BG: Rarely   Patient has failed these meds in past: None noted  Patient is currently uncontrolled on the following medications:   Glimepiride 4 mg daily   Sitagliptin-metformin XR [phone] mg daily (not taking)  Glimepiride and sitagliptin-metformin not in dispense report. Patient does not recall having these medications at home.  Called patient back after his office visit and he confirms he is not taking Janumet. States he takes glimepiride even though it is not  listed in dispense report.  He states pricking his fingers hurts and this limits him from checking his BG.   Interested in getting CGM.   Denies any polyuria, polydipsia, or polyphagia.   Diet Lots of fish and greens Drinks lots of water (attributes this to him not having a headache in over 10 years)  Last diabetic Eye exam:  Lab Results  Component Value Date/Time   HMDIABEYEEXA No Retinopathy 08/28/2015 12:00 AM    Last diabetic Foot exam:  Lab Results  Component Value Date/Time   HMDIABFOOTEX done 10/23/2013 12:00 AM    Assessed patient's feet. There are no blisters or calluses. Did not use monofilament for full foot exam.   We discussed: importance of diabetes control and medication adherence   Update 11/29/19 Patient checked his BG for the past week Recent FBG Readings:  174 188 168 197 190 181 180 Average 182.5  Patient is aware that these BG are above goal. He states he got frustrated with receiving too many mail order prescriptions so he told them to take him off of automatic refill and may have forgotten to get the Janumet refilled.   Will work with getting Janumet restarted per Dr. Etter Sjogren. Pt states he would [facility]  Plan -Continue checking fasting blood sugar daily and record -Consider starting Janumet 50-107m once daily for 1 week, then increase to 1 tablet twice daily with Dr. LNonda Louapproval  -Research options for CGM (Dexcom vs Freestyle Libre)  Hypertension   CMP Latest Ref Rng & Units 10/26/2019 10/15/2019 07/30/2019  Glucose 70 - 99 mg/dL 204(H) 208(H) 200(H)  BUN 8 - 23 mg/dL 23 24(H) 18  Creatinine 0.61 - 1.24 mg/dL 1.59(H) 1.29 1.46(H)  Sodium 135 - 145 mmol/L 143 140 143  Potassium 3.5 - 5.1 mmol/L 3.9 4.6 4.2  Chloride 98 - 111 mmol/L 102 103 103  CO2 22 - 32 mmol/L 29 32 28  Calcium 8.9 - 10.3 mg/dL 9.5 9.2 9.2  Total Protein 6.5 - 8.1 g/dL 7.2 6.4 6.7  Total Bilirubin 0.3 - 1.2 mg/dL  0.8 0.6 0.6  Alkaline Phos 38 - 126 U/L 101 89 94  AST 15 - 41 U/L 14(L) 12 15  ALT 0 - 44 U/L _0 Kidney Function Lab Results  Component Value Date/Time   CREATININE 1.59 (H) 10/26/2019 08:54 AM   CREATININE 1.29 10/15/2019 08:30 AM   CREATININE 1.46 (  H) 07/30/2019 08:07 AM   GFR 52.85 (L) 10/15/2019 08:30 AM   GFRNONAA 39 (L) 10/26/2019 08:54 AM   GFRAA 45 (L) 10/26/2019 08:54 AM   K 3.9 10/26/2019 08:54 AM   K 4.6 10/15/2019 08:30 AM   BP today is:  >140/90 as noted below  Office blood pressures are  BP Readings from Last 3 Encounters:  11/22/19 (!) 164/63  10/26/19 (!) 155/81  10/15/19 (!) 150/78   BP goal <140/90  Patient feels he has white coat syndrome  Patient has failed these meds in the past: None noted  Patient is currently uncontrolled on the following medications:   Amlodipine 20 mg daily   Losartan/HCTZ 100-25 mg daily   Per Dispense Report Amlodipine: 10/15/19 - 90 DS Losartan/hctz: 06/28/19 - 90DS  Patient checks BP at home infrequently   Amlodipine 16m: 94 tabs remaining at home per patient Losartan/hctz 100-242m 50 tabs remaining remaining at home per patient  [facility]  Patient home BP readings are ranging: Unable to assess  We discussed importance of medication adherence and monitoring BP as well as BP goal  Update 11/29/19 States he left his other BP readings at home as he is preparing to go to lunch with friends.  He reports his BP today was 123/59 with is usually BP being in the 11974Bystolic.   Plan -Continue to check BP 3-4 times per week and record -Continue current medications   Hyperlipidemia/CAD   LDL goal < 70  Lipid Panel     Component Value Date/Time   CHOL 172 10/15/2019 0830   TRIG 141.0 10/15/2019 0830   HDL 37.20 (L) 10/15/2019 0830   LDLCALC 107 (H) 10/15/2019 0830    Hepatic Function Latest Ref Rng & Units 10/26/2019 10/15/2019 07/30/2019  Total Protein 6.5 -  8.1 g/dL 7.2 6.4 6.7  Albumin 3.5 - 5.0 g/dL 4.0 4.1 3.7  AST 15 - 41 U/L 14(L) 12 15  ALT 0 - 44 U/L _0 Alk Phosphatase 38 - 126 U/L 101 89 94  Total Bilirubin 0.3 - 1.2 mg/dL 0.8 0.6 0.6  Bilirubin, Direct 0.0 - 0.3 mg/dL - - -     The ASCVD Risk score ([place] et al., 2013) failed to calculate for the following reasons:   The 2013 ASCVD risk score is only valid for ages 4067o [id number] Patient has failed these meds in past: simvastatin (intxn with amlodipine) Patient is currently uncontrolled on the following medications:   Aspirin 81 mg daily  Atorvastatin 20 mg daily  Fish Oil 2g daily  Atorvastatin not listed in dispense report  Patient reports not having this at home.  We discussed:  importance of medication adherence and LDL goal noting hx of CAD  Plan -Get refill of atorvastatin sent into pharmacy so patient can resume therapy -Continue current medications   BPH   PSA  Date Value Ref Range Status  08/30/2016 9.32 (H) 0.10 - 4.00 ng/mL Final  02/28/2014 3.74 0.10 - 4.00 ng/mL Final  10/20/2009 3.53 0.10 - 4.00 ng/mL Final  Patient states his PSA was 0.11 (he originally stated it was 1.1 at his 11/22/19 CCM visit) [facility]  Patient has failed these meds in past: None noted  Patient is currently controlled per patient report of most recent PSA on the following medications:   Tamsulosin 0.4 mg daily  Followed by urology. Sees him every  6 months Stream not as good, not smooth, less bubbles, good emptying  Plan -Continue current medications   Medication Management   Pt [facility] most medications Uses pill box? No - doesn't like pill boxes Pt endorses 100% compliance  We discussed: Importance of medication adherence.   Plan Continue current medication management strategy

## 2019-12-11 ENCOUNTER — Other Ambulatory Visit: Payer: Self-pay

## 2019-12-11 ENCOUNTER — Ambulatory Visit: Payer: Medicare HMO | Admitting: Pharmacist

## 2019-12-11 DIAGNOSIS — E1165 Type 2 diabetes mellitus with hyperglycemia: Secondary | ICD-10-CM

## 2019-12-11 DIAGNOSIS — I10 Essential (primary) hypertension: Secondary | ICD-10-CM

## 2019-12-11 DIAGNOSIS — E1169 Type 2 diabetes mellitus with other specified complication: Secondary | ICD-10-CM

## 2019-12-11 NOTE — Chronic Care Management (AMB) (Signed)
Chronic Care Management Pharmacy  Name: [name]  MRN: [id number] DOB: 03/24/1932  Chief Complaint/ HPI  [name],  86 y.o. , male presents for their Follow-Up CCM visit with the clinical pharmacist via telephone due to COVID-19 Pandemic   PCP : [name], DO  Their chronic conditions include: Diabetes, HTN, Hyperlipidemia/CAD, BPH, CKD  Office Visits: None since last CCM visit on 11/29/19.   Consult Visit: None since last CCM visit on 11/29/19.   Medications: Outpatient Encounter Medications as of 12/11/2019  Medication Sig  . Alcohol Swabs PADS Use as directed once a [name]  . amLODipine (NORVASC) 10 MG tablet Take 1 tablet (10 mg total) by mouth daily.  [name] aspirin 81 MG tablet Take 81 mg by mouth daily.  [name] atorvastatin (LIPITOR) 10 MG tablet TAKE 1 TABLET EVERY [name]  . Blood Glucose Calibration (TRUE METRIX LEVEL 1) Low SOLN Use as directed.  . Blood Glucose Monitoring Suppl (TRUE METRIX AIR GLUCOSE METER) w/Device KIT 1 each by Does not apply route daily. Use as directed once a [name]  . brimonidine-timolol (COMBIGAN) 0.2-0.5 % ophthalmic solution Place 1 drop into the right eye 2 (two) times daily.   . fish oil-omega-3 fatty acids 1000 MG capsule Take 2 g by mouth daily.    [name] glimepiride (AMARYL) 4 MG tablet TAKE 1 TABLET EVERY [name] WITH BREAKFAST  . glucose blood (TRUE METRIX BLOOD GLUCOSE TEST) test strip Use as directed once a [name].  Dx code: E11.9  . losartan-hydrochlorothiazide (HYZAAR) 100-25 MG tablet TAKE 1 TABLET EVERY [name]  . Multiple Vitamin (MULTIVITAMIN) tablet Take 1 tablet by mouth daily.  . SitaGLIPtin-MetFORMIN HCl (JANUMET XR) [phone] MG TB24 Take 1 tablet by mouth daily. (Patient not taking: Reported on 11/22/2019)  . tamsulosin (FLOMAX) 0.4 MG CAPS capsule TAKE 1 CAPSULE (0.4 MG TOTAL) BY MOUTH DAILY.  . TRUEplus Lancets 30G MISC Use as directed once a [name].  E11.9   No facility-administered encounter medications on file as of 12/11/2019.   SDOH  Screenings   Alcohol Screen:   . Last Alcohol Screening Score (AUDIT):   Depression (PHQ2-9): Low Risk   . PHQ-2 Score: 0  Financial Resource Strain: Low Risk   . Difficulty of Paying Living Expenses: Not hard at all  Food Insecurity: No Food Insecurity  . Worried About Charity fundraiser in the Last Year: Never true  . Ran Out of Food in the Last Year: Never true  Housing: Low Risk   . Last Housing Risk Score: 0  Physical Activity:   . Days of Exercise per Week:   . Minutes of Exercise per Session:   Social Connections:   . Frequency of Communication with Friends and Family:   . Frequency of Social Gatherings with Friends and Family:   . Attends Religious Services:   . Active Member of Clubs or Organizations:   . Attends Archivist Meetings:   [name] Marital Status:   Stress:   . Feeling of Stress :   Tobacco Use: Medium Risk  . Smoking Tobacco Use: Former Smoker  . Smokeless Tobacco Use: Never Used  Transportation Needs: No Transportation Needs  . Lack of Transportation (Medical): No  . Lack of Transportation (Non-Medical): No     Current Diagnosis/Assessment:  Goals Addressed            This Visit's Progress   . Chronic Care Management Pharmacy Care Plan       CARE PLAN  ENTRY (see longitudinal plan of care for additional care plan information)  Current Barriers:  . Chronic Disease Management support, education, and care coordination needs related to Diabetes, HTN, Hyperlipidemia/CAD, BPH, CKD   Hypertension BP Readings from Last 3 Encounters:  11/22/19 (!) 164/63  10/26/19 (!) 155/81  10/15/19 (!) 150/78   . Pharmacist Clinical Goal(s): o Over the next 90 days, patient will work with PharmD and providers to achieve BP goal <140/90 . Current regimen:  o Amlodipine 10 mg daily, Losartan/HCTZ 100-25 mg daily  . Interventions: o Requested patient to check his blood pressure 3-4 times per week and record o Discussed BP goal and importance of monitoring  BP o Reinforced importance of adherence . Patient self care activities - Over the next 90 days, patient will: o Check BP 3-4 times per week, document, and provide at future appointments o Ensure daily salt intake < 2300 mg/Amana [name]  Hyperlipidemia Lab Results  Component Value Date/Time   LDLCALC 107 (H) 10/15/2019 08:30 AM   . Pharmacist Clinical Goal(s): o Over the next 90 days, patient will work with PharmD and providers to achieve LDL goal < 70 . Current regimen:  o Atorvastatin 7m daily . Interventions: o Get refill of atorvastatin for patient  o Discussed importance of medication adherence  . Patient self care activities - Over the next 90 days, patient will: o Maintain cholesterol medication regimen.   Diabetes Lab Results  Component Value Date/Time   HGBA1C 8.8 (H) 10/15/2019 08:30 AM   HGBA1C 7.3 (H) 04/06/2019 09:34 AM   . Pharmacist Clinical Goal(s): o Over the next 90 days, patient will work with PharmD and providers to achieve A1c goal <7% . Current regimen:  o Glimepiride 445mdaily . Interventions: o Requested patient to check blood sugar daily and record o Collaboration with provider regarding medication management (Janumet titration) o Consider starting Janumet XR 50-1000 once daily for a week then increasing to Janumet XR 50-1000 twice daily . Patient self care activities - Over the next 90 days, patient will: o Check blood sugar once daily, document, and provide at future appointments o Contact provider with any episodes of hypoglycemia  Osteoporosis Screening . Pharmacist Clinical Goal(s) o Over the next 90 days, patient will work with PharmD and providers to reduce risk of fracture due to osteopenia/osteoporosis . Current regimen:  o None . Interventions: o Consider daily intake of 120028mf calcium daily through diet or supplementation o Consider daily intake of [phone] units of vitamin D through supplementation  o Consider getting DEXA (bone density  scan) for further assessment . Patient self care activities - Over the next 90 days, patient will: o Increase intake to 1200m70m calcium daily through diet and/or supplementation o Increase intake to [phone] units of vitamin D daily through supplementation o Complete DEXA Scan per Dr. LownEtter Sjogrendication management . Pharmacist Clinical Goal(s): o Over the next 90 days, patient will work with PharmD and providers to achieve optimal medication adherence . Current pharmacy: [facility] . Interventions o Comprehensive medication review performed. o Continue current medication management strategy . Patient self care activities - Over the next 90 days, patient will: o Focus on medication adherence by filling and taking medications appropriately  o Take medications as prescribed o Report any questions or concerns to PharmD and/or provider(s)  Please see past updates related to this goal by clicking on the "Past Updates" button in the selected goal       Social Hx: Wife passed  away in 2016. He loves gardening, especially tomatoes.  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  He has 3 sons and 4 grandchildren. All of his children are successful and he is very proud of them.   Diabetes   Recent Relevant Labs: Lab Results  Component Value Date/Time   HGBA1C 8.8 (H) 10/15/2019 08:30 AM   HGBA1C 7.3 (H) 04/06/2019 09:34 AM   MICROALBUR 50.7 (H) 04/06/2019 09:34 AM   MICROALBUR 14.1 (H) 01/31/2019 08:03 AM    From 11/22/19 CCM Visit  Checking BG: Rarely   Patient has failed these meds in past: None noted  Patient is currently uncontrolled on the following medications:   Glimepiride 4 mg daily   Sitagliptin-metformin XR [phone] mg daily (not taking)  Glimepiride and sitagliptin-metformin not in dispense report. Patient does not recall having these medications at home.  Called patient back after his office visit and he confirms he is not taking Janumet. States he  takes glimepiride even though it is not listed in dispense report.  He states pricking his fingers hurts and this limits him from checking his BG.   Interested in getting CGM.   Denies any polyuria, polydipsia, or polyphagia.   Diet Lots of fish and greens Drinks lots of water (attributes this to him not having a headache in over 10 years)  Last diabetic Eye exam:  Lab Results  Component Value Date/Time   HMDIABEYEEXA No Retinopathy 08/28/2015 12:00 AM    Last diabetic Foot exam:  Lab Results  Component Value Date/Time   HMDIABFOOTEX done 10/23/2013 12:00 AM    Assessed patient's feet. There are no blisters or calluses. Did not use monofilament for full foot exam.   We discussed: importance of diabetes control and medication adherence   Update 11/29/19 Patient checked his BG for the past week Recent FBG Readings:  174 188 168 197 190 181 180 Average 182.5  Patient is aware that these BG are above goal. He states he got frustrated with receiving too many mail order prescriptions so he told them to take him off of automatic refill and may have forgotten to get the Janumet refilled.   Will work with getting Janumet restarted per Dr. Etter Sjogren. Pt states [facility]  Update 12/11/19 184 124 149 126 173 148 166 179 189  178 153 154 Average 160.25  Eating better. Avocado daily. English crumpet w/ 2 sunny side up eggs plus coffee with cream, but not sugar for breakfast . Minimal bread. Buckwheat cereal. Eating fish. Asparagus Brocoli Brussel Sprouts, salads with vinegarette daily  Has not received new Janumet script  Running in the evening about 1/2 mile.    Plan -Continue checking fasting blood sugar daily and record -Consider starting Janumet XR 50-1036m once daily for 1 week, then increase to 1 tablet twice daily with Dr. LNonda Louapproval  -Get patient set up with Dexcom  Hypertension   CMP  Latest Ref Rng & Units 10/26/2019 10/15/2019 07/30/2019  Glucose 70 - 99 mg/dL 204(H) 208(H) 200(H)  BUN 8 - 23 mg/dL 23 24(H) 18  Creatinine 0.61 - 1.24 mg/dL 1.59(H) 1.29 1.46(H)  Sodium 135 - 145 mmol/L 143 140 143  Potassium 3.5 - 5.1 mmol/L 3.9 4.6 4.2  Chloride 98 - 111 mmol/L 102 103 103  CO2 22 - 32 mmol/L 29 32 28  Calcium 8.9 - 10.3 mg/dL 9.5 9.2 9.2  Total  Protein 6.5 - 8.1 g/dL 7.2 6.4 6.7  Total Bilirubin 0.3 - 1.2 mg/dL 0.8 0.6 0.6  Alkaline Phos 38 - 126 U/L 101 89 94  AST 15 - 41 U/L 14(L) 12 15  ALT 0 - 44 U/L 16 13 17    Kidney Function Lab Results  Component Value Date/Time   CREATININE 1.59 (H) 10/26/2019 08:54 AM   CREATININE 1.29 10/15/2019 08:30 AM   CREATININE 1.46 (H) 07/30/2019 08:07 AM   GFR 52.85 (L) 10/15/2019 08:30 AM   GFRNONAA 39 (L) 10/26/2019 08:54 AM   GFRAA 45 (L) 10/26/2019 08:54 AM   K 3.9 10/26/2019 08:54 AM   K 4.6 10/15/2019 08:30 AM   BP today is:  >140/90 as noted below  Office blood pressures are  BP Readings from Last 3 Encounters:  11/22/19 (!) 164/63  10/26/19 (!) 155/81  10/15/19 (!) 150/78   BP goal <140/90  Patient feels he has white coat syndrome  Patient has failed these meds in the past: None noted  Patient is currently uncontrolled on the following medications:   Amlodipine 10 mg daily   Losartan/HCTZ 100-25 mg daily   Per Dispense Report Amlodipine: 10/15/19 - 90 DS Losartan/hctz: 06/28/19 - 90DS  Patient checks BP at home infrequently   Amlodipine 81m: 94 tabs remaining at home per patient Losartan/hctz 100-243m 50 tabs remaining remaining at home per patient  [facility]  Patient home BP readings are ranging: Unable to assess  We discussed importance of medication adherence and monitoring BP as well as BP goal  Update 11/29/19 States he left his other BP readings at home as he is preparing to go to lunch with friends.  He reports his BP today was 123/59 with is  usually BP being in the 11670Lystolic.   Update 12/11/19 Patient home BP readings are ranging: 156 65 61 156 67 62 144 64 61 163 64 60 174 57 59 173 64 62 166 69 58 157 69 65 154  69 62 178 63 60 143 65 68 173 70  63 Average 161.4 65.5 61.7  Denies headache and chest pain. Reinforced adherence. Believe elevated bp is due to adherence.   Plan -Continue to check BP 3-4 times per week and record -Continue current medications   Hyperlipidemia/CAD   LDL goal < 70  Lipid Panel     Component Value Date/Time   CHOL 172 10/15/2019 0830   TRIG 141.0 10/15/2019 0830   HDL 37.20 (L) 10/15/2019 0830   LDLCALC 107 (H) 10/15/2019 0830    Hepatic Function Latest Ref Rng & Units 10/26/2019 10/15/2019 07/30/2019  Total Protein 6.5 - 8.1 g/dL 7.2 6.4 6.7  Albumin 3.5 - 5.0 g/dL 4.0 4.1 3.7  AST 15 - 41 U/L 14(L) 12 15  ALT 0 - 44 U/L 16 13 17   Alk Phosphatase 38 - 126 U/L 101 89 94  Total Bilirubin 0.3 - 1.2 mg/dL 0.8 0.6 0.6  Bilirubin, Direct 0.0 - 0.3 mg/dL - - -     The ASCVD Risk score ([place] et al., 2013) failed to calculate for the following reasons:   The 2013 ASCVD risk score is only valid for ages 4020o 7945 Patient has failed these meds in past: simvastatin (intxn with amlodipine) Patient is currently uncontrolled on the following medications:   Aspirin 81 mg daily  Atorvastatin 20 mg daily  Fish Oil 2g daily  Atorvastatin not listed in dispense report  Patient  reports not having this at home.  We discussed:  importance of medication adherence and LDL goal noting hx of CAD  Plan -Get refill of atorvastatin sent into pharmacy so patient can resume therapy -Continue current medications

## 2019-12-26 ENCOUNTER — Other Ambulatory Visit: Payer: Self-pay | Admitting: Family Medicine

## 2019-12-26 DIAGNOSIS — E1165 Type 2 diabetes mellitus with hyperglycemia: Secondary | ICD-10-CM

## 2019-12-26 DIAGNOSIS — E1169 Type 2 diabetes mellitus with other specified complication: Secondary | ICD-10-CM

## 2019-12-26 MED ORDER — JANUMET XR 50-1000 MG PO TB24: ORAL_TABLET | ORAL | 3 refills | Status: DC

## 2019-12-26 MED ORDER — ATORVASTATIN CALCIUM 20 MG PO TABS: 20.0000 mg | ORAL_TABLET | Freq: Every day | ORAL | 3 refills | Status: DC

## 2019-12-26 NOTE — Patient Instructions (Addendum)
Visit Information  Goals Addressed            This Visit's Progress   . Chronic Care Management Pharmacy Care Plan       CARE PLAN ENTRY (see longitudinal plan of care for additional care plan information)  Current Barriers:  . Chronic Disease Management support, education, and care coordination needs related to Diabetes, HTN, Hyperlipidemia/CAD, BPH, CKD   Hypertension BP Readings from Last 3 Encounters:  11/22/19 (!) 164/63  10/26/19 (!) 155/81  10/15/19 (!) 150/78   . Pharmacist Clinical Goal(s): o Over the next 90 days, patient will work with PharmD and providers to achieve BP goal <140/90 . Current regimen:  o Amlodipine 10 mg daily, Losartan/HCTZ 100-25 mg daily  . Interventions: o Requested patient to check his blood pressure 3-4 times per week and record o Discussed BP goal and importance of monitoring BP o Reinforced importance of adherence . Patient self care activities - Over the next 90 days, patient will: o Check BP 3-4 times per week, document, and provide at future appointments o Ensure daily salt intake < 2300 mg/Yvonda Fouty  Hyperlipidemia Lab Results  Component Value Date/Time   LDLCALC 107 (H) 10/15/2019 08:30 AM   . Pharmacist Clinical Goal(s): o Over the next 90 days, patient will work with PharmD and providers to achieve LDL goal < 70 . Current regimen:  o Atorvastatin 20mg  daily . Interventions: o Get refill of atorvastatin for patient  o Discussed importance of medication adherence  . Patient self care activities - Over the next 90 days, patient will: o Maintain cholesterol medication regimen.   Diabetes Lab Results  Component Value Date/Time   HGBA1C 8.8 (H) 10/15/2019 08:30 AM   HGBA1C 7.3 (H) 04/06/2019 09:34 AM   . Pharmacist Clinical Goal(s): o Over the next 90 days, patient will work with PharmD and providers to achieve A1c goal <7% . Current regimen:  o Glimepiride 4mg  daily . Interventions: o Requested patient to check blood sugar  daily and record o Collaboration with provider regarding medication management (Janumet titration) o Consider starting Janumet XR 50-1000 once daily for a week then increasing to Janumet XR 50-1000 twice daily . Patient self care activities - Over the next 90 days, patient will: o Check blood sugar once daily, document, and provide at future appointments o Contact provider with any episodes of hypoglycemia  Osteoporosis Screening . Pharmacist Clinical Goal(s) o Over the next 90 days, patient will work with PharmD and providers to reduce risk of fracture due to osteopenia/osteoporosis . Current regimen:  o None . Interventions: o Consider daily intake of 1200mg  of calcium daily through diet or supplementation o Consider daily intake of [phone] units of vitamin D through supplementation  o Consider getting DEXA (bone density scan) for further assessment . Patient self care activities - Over the next 90 days, patient will: o Increase intake to 1200mg  of calcium daily through diet and/or supplementation o Increase intake to [phone] units of vitamin D daily through supplementation o Complete DEXA Scan per Dr. Etter Sjogren  Medication management . Pharmacist Clinical Goal(s): o Over the next 90 days, patient will work with PharmD and providers to achieve optimal medication adherence . Current pharmacy: [facility] . Interventions o Comprehensive medication review performed. o Continue current medication management strategy . Patient self care activities - Over the next 90 days, patient will: o Focus on medication adherence by filling and taking medications appropriately  o Take medications as prescribed o Report any questions or  concerns to PharmD and/or provider(s)  Please see past updates related to this goal by clicking on the "Past Updates" button in the selected goal         Patient verbalizes understanding of instructions provided today.   Telephone follow up appointment with  pharmacy team member scheduled for: 01/14/2020  Melvenia Beam Sanvika Cuttino, PharmD Clinical Pharmacist [facility] [phone]

## 2020-01-14 ENCOUNTER — Ambulatory Visit: Payer: Medicare HMO | Admitting: Pharmacist

## 2020-01-14 DIAGNOSIS — I10 Essential (primary) hypertension: Secondary | ICD-10-CM

## 2020-01-14 DIAGNOSIS — E785 Hyperlipidemia, unspecified: Secondary | ICD-10-CM

## 2020-01-14 DIAGNOSIS — E1165 Type 2 diabetes mellitus with hyperglycemia: Secondary | ICD-10-CM

## 2020-01-14 NOTE — Chronic Care Management (AMB) (Signed)
Chronic Care Management Pharmacy  Name: [name]  MRN: [id number] DOB: 1931-12-02  Chief Complaint/ HPI  [name],  86 y.o. , male presents for their Follow-Up CCM visit with the clinical pharmacist via telephone due to COVID-19 Pandemic   PCP : [name], DO  Their chronic conditions include: Diabetes, HTN, Hyperlipidemia/CAD, BPH, CKD  Office Visits: None since last CCM visit on 12/11/19.   Consult Visit: None since last CCM visit on 12/11/19.    Medications: Outpatient Encounter Medications as of 01/14/2020  Medication Sig  . Alcohol Swabs PADS Use as directed once a [name]  . amLODipine (NORVASC) 10 MG tablet Take 1 tablet (10 mg total) by mouth daily.  [name] aspirin 81 MG tablet Take 81 mg by mouth daily.  [name] atorvastatin (LIPITOR) 20 MG tablet Take 1 tablet (20 mg total) by mouth daily.  . Blood Glucose Calibration (TRUE METRIX LEVEL 1) Low SOLN Use as directed.  . Blood Glucose Monitoring Suppl (TRUE METRIX AIR GLUCOSE METER) w/Device KIT 1 each by Does not apply route daily. Use as directed once a [name]  . brimonidine-timolol (COMBIGAN) 0.2-0.5 % ophthalmic solution Place 1 drop into the right eye 2 (two) times daily.   . fish oil-omega-3 fatty acids 1000 MG capsule Take 2 g by mouth daily.    [name] glimepiride (AMARYL) 4 MG tablet TAKE 1 TABLET EVERY [name] WITH BREAKFAST  . glucose blood (TRUE METRIX BLOOD GLUCOSE TEST) test strip Use as directed once a [name].  Dx code: E11.9  . losartan-hydrochlorothiazide (HYZAAR) 100-25 MG tablet TAKE 1 TABLET EVERY [name]  . Multiple Vitamin (MULTIVITAMIN) tablet Take 1 tablet by mouth daily.  . SitaGLIPtin-MetFORMIN HCl (JANUMET XR) 50-1000 MG TB24 2 po qd  . tamsulosin (FLOMAX) 0.4 MG CAPS capsule TAKE 1 CAPSULE (0.4 MG TOTAL) BY MOUTH DAILY.  . TRUEplus Lancets 30G MISC Use as directed once a [name].  E11.9   No facility-administered encounter medications on file as of 01/14/2020.   SDOH Screenings   Alcohol Screen:   . Last Alcohol  Screening Score (AUDIT):   Depression (PHQ2-9): Low Risk   . PHQ-2 Score: 0  Financial Resource Strain: Low Risk   . Difficulty of Paying Living Expenses: Not hard at all  Food Insecurity: No Food Insecurity  . Worried About Charity fundraiser in the Last Year: Never true  . Ran Out of Food in the Last Year: Never true  Housing: Low Risk   . Last Housing Risk Score: 0  Physical Activity:   . Days of Exercise per Week:   . Minutes of Exercise per Session:   Social Connections:   . Frequency of Communication with Friends and Family:   . Frequency of Social Gatherings with Friends and Family:   . Attends Religious Services:   . Active Member of Clubs or Organizations:   . Attends Archivist Meetings:   [name] Marital Status:   Stress:   . Feeling of Stress :   Tobacco Use: Medium Risk  . Smoking Tobacco Use: Former Smoker  . Smokeless Tobacco Use: Never Used  Transportation Needs: No Transportation Needs  . Lack of Transportation (Medical): No  . Lack of Transportation (Non-Medical): No     Current Diagnosis/Assessment:  Goals Addressed            This Visit's Progress   . Chronic Care Management Pharmacy Care Plan       CARE PLAN ENTRY (see longitudinal plan  of care for additional care plan information)  Current Barriers:  . Chronic Disease Management support, education, and care coordination needs related to Diabetes, HTN, Hyperlipidemia/CAD, BPH, CKD   Hypertension BP Readings from Last 3 Encounters:  11/22/19 (!) 164/63  10/26/19 (!) 155/81  10/15/19 (!) 150/78   . Pharmacist Clinical Goal(s): o Over the next 90 days, patient will work with PharmD and providers to achieve BP goal <140/90 . Current regimen:  o Amlodipine 10 mg daily o Losartan/HCTZ 100-25 mg daily  . Interventions: o Requested patient to check his blood pressure 3-4 times per week and record o Discussed BP goal and importance of monitoring BP o Reinforced importance of  adherence . Patient self care activities - Over the next 90 days, patient will: o Check BP 3-4 times per week, document, and provide at future appointments o Ensure daily salt intake < 2300 mg/[name]  Hyperlipidemia Lab Results  Component Value Date/Time   LDLCALC 107 (H) 10/15/2019 08:30 AM   . Pharmacist Clinical Goal(s): o Over the next 90 days, patient will work with PharmD and providers to achieve LDL goal < 70 . Current regimen:  o Atorvastatin 37m daily . Interventions: o Get refill of atorvastatin for patient  o Discussed importance of medication adherence  . Patient self care activities - Over the next 90 days, patient will: o Maintain cholesterol medication regimen.   Diabetes Lab Results  Component Value Date/Time   HGBA1C 8.8 (H) 10/15/2019 08:30 AM   HGBA1C 7.3 (H) 04/06/2019 09:34 AM   . Pharmacist Clinical Goal(s): o Over the next 90 days, patient will work with PharmD and providers to achieve A1c goal <7% . Current regimen:  o Glimepiride 448mdaily o Janumet 50-100019maily x 1 week, then increase to twice daily . Interventions: o Requested patient to check blood sugar daily and record o Collaboration with provider regarding medication management (Janumet titration) o Consider starting Janumet XR 50-1000 once daily for a week then increasing to Janumet XR 50-1000 twice daily . Patient self care activities - Over the next 90 days, patient will: o Check blood sugar once daily, document, and provide at future appointments o Contact provider with any episodes of hypoglycemia  Osteoporosis Screening . Pharmacist Clinical Goal(s) o Over the next 90 days, patient will work with PharmD and providers to reduce risk of fracture due to osteopenia/osteoporosis . Current regimen:  o None . Interventions: o Consider daily intake of 1200m5m calcium daily through diet or supplementation o Consider daily intake of [phone] units of vitamin D through supplementation   o Consider getting DEXA (bone density scan) for further assessment . Patient self care activities - Over the next 90 days, patient will: o Increase intake to 1200mg61mcalcium daily through diet and/or supplementation o Increase intake to [phone] units of vitamin D daily through supplementation o Complete DEXA Scan per Dr. LowneEtter Sjogrenication management . Pharmacist Clinical Goal(s): o Over the next 90 days, patient will work with PharmD and providers to achieve optimal medication adherence . Current pharmacy: [facility] . Interventions o Comprehensive medication review performed. o Continue current medication management strategy . Patient self care activities - Over the next 90 days, patient will: o Focus on medication adherence by filling and taking medications appropriately  o Take medications as prescribed o Report any questions or concerns to PharmD and/or provider(s)  Please see past updates related to this goal by clicking on the "Past Updates" button in the selected goal  Social Hx: Wife passed away in 2014/08/08. He loves gardening, especially tomatoes.  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  He has 3 sons and 4 grandchildren. All of his children are successful and he is very proud of them.   Diabetes   Recent Relevant Labs: Lab Results  Component Value Date/Time   HGBA1C 8.8 (H) 10/15/2019 08:30 AM   HGBA1C 7.3 (H) 04/06/2019 09:34 AM   MICROALBUR 50.7 (H) 04/06/2019 09:34 AM   MICROALBUR 14.1 (H) 01/31/2019 08:03 AM    From 11/22/19 CCM Visit  Checking BG: Rarely   Patient has failed these meds in past: None noted  Patient is currently uncontrolled on the following medications:   Glimepiride 4 mg daily   Sitagliptin-metformin XR [phone] mg daily (not taking)  Glimepiride and sitagliptin-metformin not in dispense report. Patient does not recall having these medications at home.  Called patient back after his office visit and he  confirms he is not taking Janumet. States he takes glimepiride even though it is not listed in dispense report.  He states pricking his fingers hurts and this limits him from checking his BG.   Interested in getting CGM.   Denies any polyuria, polydipsia, or polyphagia.   Diet Lots of fish and greens Drinks lots of water (attributes this to him not having a headache in over 10 years)  Last diabetic Eye exam:  Lab Results  Component Value Date/Time   HMDIABEYEEXA No Retinopathy 08/28/2015 12:00 AM    Last diabetic Foot exam:  Lab Results  Component Value Date/Time   HMDIABFOOTEX done 10/23/2013 12:00 AM    Assessed patient's feet. There are no blisters or calluses. Did not use monofilament for full foot exam.   We discussed: importance of diabetes control and medication adherence   Update 11/29/19 Patient checked his BG for the past week Recent FBG Readings:  174 188 168 197 190 181 180 Average 182.5  Patient is aware that these BG are above goal. He states he got frustrated with receiving too many mail order prescriptions so he told them to take him off of automatic refill and may have forgotten to get the Janumet refilled.   Will work with getting Janumet restarted per Dr. Etter Sjogren. Pt states he [facility]  Update 7/6/_0 178 153 154 Average 160.25  Eating better. Avocado daily. English crumpet w/ 2 sunny side up eggs plus coffee with cream, but not sugar for breakfast . Minimal bread. Buckwheat cereal. Eating fish. Asparagus Brocoli Brussel Sprouts, salads with vinegarette daily  Has not received new Janumet script  Running in the evening about 1/2 mile.   Update 01/14/20 Received Janumet? No: Not in dispense report and pt states he has not picked up the medication. Dexcom: Cost prohibitive; $180 initial plus $130/90 DS. Pt states this is not affordable for  him. FBG: 266 179 179 190 173 157 Average 190.6  Called pharmacy after visit and confirmed that both scripts (Janumet and atorvastatin) were received. Technician stated they would be ready this evening. Called patient to inform him. He verbalized understanding.   Plan -Continue checking fasting blood sugar daily and record -Start Janumet XR 50-1042m once daily for 1 week, then increase to 1 tablet twice daily (Dr.Lowne approved)  Future Plan -See if Freestyle Libre available through pharmacy coverage.   Hypertension   CMP  Latest Ref Rng & Units 10/26/2019 10/15/2019 07/30/2019  Glucose 70 - 99 mg/dL 204(H) 208(H) 200(H)  BUN 8 - 23 mg/dL 23 24(H) 18  Creatinine 0.61 - 1.24 mg/dL 1.59(H) 1.29 1.46(H)  Sodium 135 - 145 mmol/L 143 140 143  Potassium 3.5 - 5.1 mmol/L 3.9 4.6 4.2  Chloride 98 - 111 mmol/L 102 103 103  CO2 22 - 32 mmol/L 29 32 28  Calcium 8.9 - 10.3 mg/dL 9.5 9.2 9.2  Total Protein 6.5 - 8.1 g/dL 7.2 6.4 6.7  Total Bilirubin 0.3 - 1.2 mg/dL 0.8 0.6 0.6  Alkaline Phos 38 - 126 U/L 101 89 94  AST 15 - 41 U/L 14(L) 12 15  ALT 0 - 44 U/L _0 Kidney Function Lab Results  Component Value Date/Time   CREATININE 1.59 (H) 10/26/2019 08:54 AM   CREATININE 1.29 10/15/2019 08:30 AM   CREATININE 1.46 (H) 07/30/2019 08:07 AM   GFR 52.85 (L) 10/15/2019 08:30 AM   GFRNONAA 39 (L) 10/26/2019 08:54 AM   GFRAA 45 (L) 10/26/2019 08:54 AM   K 3.9 10/26/2019 08:54 AM   K 4.6 10/15/2019 08:30 AM   BP today is:  >140/90 as noted below  Office blood pressures are  BP Readings from Last 3 Encounters:  11/22/19 (!) 164/63  10/26/19 (!) 155/81  10/15/19 (!) 150/78   BP goal <140/90  Patient feels he has white coat syndrome  Patient has failed these meds in the past: None noted  Patient is currently uncontrolled, but improving on the following medications:   Amlodipine 10 mg daily   Losartan/HCTZ 100-25 mg daily   Per Dispense Report Amlodipine: 10/15/19 - 90  DS Losartan/hctz: 06/28/19 - 90DS  Patient checks BP at home infrequently   Amlodipine 37m: 94 tabs remaining at home per patient Losartan/hctz 100-244m 50 tabs remaining remaining at home per patient  [facility].  We discussed importance of medication adherence and monitoring BP as well as BP goal  Update 11/29/19 States he left his other BP readings at home as he is preparing to go to lunch with friends.  He reports his BP today was 123/59 with is usually BP being in the 11749Systolic.   Update 12/11/19 Patient home BP readings are ranging: _1 _2 63 Average 161.4 65.5 61.7  Denies headache and chest pain. Reinforced adherence. Believe elevated bp is due to adherence.   Update 8/9/_3 140 77 68 120 63 65 149 70 65 Average 140.8 67.5 64.5  Plan -Continue to check BP 3-4 times per week and record -Continue current medications   Hyperlipidemia/CAD   LDL goal < 70  Lipid Panel     Component Value Date/Time   CHOL 172 10/15/2019 0830   TRIG 141.0 10/15/2019 0830   HDL 37.20 (L) 10/15/2019 0830   LDLCALC 107 (H) 10/15/2019 0830    Hepatic Function Latest Ref Rng & Units 10/26/2019 10/15/2019 07/30/2019  Total Protein 6.5 - 8.1 g/dL 7.2 6.4 6.7  Albumin 3.5 - 5.0 g/dL 4.0 4.1 3.7  AST 15 - 41 U/L 14(L) 12 15  ALT 0 - 44 U/L _4 Alk Phosphatase 38 - 126 U/L 101 89 94  Total Bilirubin  0.3 - 1.2 mg/dL 0.8 0.6 0.6  Bilirubin, Direct 0.0 - 0.3 mg/dL - - -     The ASCVD Risk score Mikey Bussing DC Jr., et al., 2013) failed to calculate for the following reasons:   The 2013 ASCVD risk score is only valid for ages 42 to 85   Patient has failed these meds in past: simvastatin (intxn with amlodipine) Patient is currently uncontrolled on the following medications:   Aspirin 81 mg  daily  Atorvastatin 20 mg daily  Fish Oil 2g daily  Atorvastatin not listed in dispense report  Patient reports not having this at home.  We discussed:  importance of medication adherence and LDL goal noting hx of CAD   Update 01/14/20 Receive atorvastatin script? No  Called pharmacy after visit and confirmed that both scripts (Janumet and atorvastatin) were received. Technician stated they would be ready this evening. Called patient to inform him. He verbalized understanding.   Plan -Start atorvastatin -Continue current medications

## 2020-01-14 NOTE — Patient Instructions (Signed)
Visit Information  Goals Addressed            This Visit's Progress   . Chronic Care Management Pharmacy Care Plan       CARE PLAN ENTRY (see longitudinal plan of care for additional care plan information)  Current Barriers:  . Chronic Disease Management support, education, and care coordination needs related to Diabetes, HTN, Hyperlipidemia/CAD, BPH, CKD   Hypertension BP Readings from Last 3 Encounters:  11/22/19 (!) 164/63  10/26/19 (!) 155/81  10/15/19 (!) 150/78   . Pharmacist Clinical Goal(s): o Over the next 90 days, patient will work with PharmD and providers to achieve BP goal <140/90 . Current regimen:  o Amlodipine 10 mg daily o Losartan/HCTZ 100-25 mg daily  . Interventions: o Requested patient to check his blood pressure 3-4 times per week and record o Discussed BP goal and importance of monitoring BP o Reinforced importance of adherence . Patient self care activities - Over the next 90 days, patient will: o Check BP 3-4 times per week, document, and provide at future appointments o Ensure daily salt intake < 2300 mg/Jsean Taussig  Hyperlipidemia Lab Results  Component Value Date/Time   LDLCALC 107 (H) 10/15/2019 08:30 AM   . Pharmacist Clinical Goal(s): o Over the next 90 days, patient will work with PharmD and providers to achieve LDL goal < 70 . Current regimen:  o Atorvastatin 20mg  daily . Interventions: o Get refill of atorvastatin for patient  o Discussed importance of medication adherence  . Patient self care activities - Over the next 90 days, patient will: o Maintain cholesterol medication regimen.   Diabetes Lab Results  Component Value Date/Time   HGBA1C 8.8 (H) 10/15/2019 08:30 AM   HGBA1C 7.3 (H) 04/06/2019 09:34 AM   . Pharmacist Clinical Goal(s): o Over the next 90 days, patient will work with PharmD and providers to achieve A1c goal <7% . Current regimen:  o Glimepiride 4mg  daily o Janumet 50-1000mg  daily x 1 week, then increase to twice  daily . Interventions: o Requested patient to check blood sugar daily and record o Collaboration with provider regarding medication management (Janumet titration) o Consider starting Janumet XR 50-1000 once daily for a week then increasing to Janumet XR 50-1000 twice daily . Patient self care activities - Over the next 90 days, patient will: o Check blood sugar once daily, document, and provide at future appointments o Contact provider with any episodes of hypoglycemia  Osteoporosis Screening . Pharmacist Clinical Goal(s) o Over the next 90 days, patient will work with PharmD and providers to reduce risk of fracture due to osteopenia/osteoporosis . Current regimen:  o None . Interventions: o Consider daily intake of 1200mg  of calcium daily through diet or supplementation o Consider daily intake of [phone] units of vitamin D through supplementation  o Consider getting DEXA (bone density scan) for further assessment . Patient self care activities - Over the next 90 days, patient will: o Increase intake to 1200mg  of calcium daily through diet and/or supplementation o Increase intake to [phone] units of vitamin D daily through supplementation o Complete DEXA Scan per Dr. Etter Sjogren  Medication management . Pharmacist Clinical Goal(s): o Over the next 90 days, patient will work with PharmD and providers to achieve optimal medication adherence . Current pharmacy: [facility] . Interventions o Comprehensive medication review performed. o Continue current medication management strategy . Patient self care activities - Over the next 90 days, patient will: o Focus on medication adherence by filling and taking  medications appropriately  o Take medications as prescribed o Report any questions or concerns to PharmD and/or provider(s)  Please see past updates related to this goal by clicking on the "Past Updates" button in the selected goal         Patient verbalizes understanding of  instructions provided today.   The pharmacy team will reach out to the patient again over the next 30 days.   De Blanch, PharmD Clinical Pharmacist [facility] [phone]

## 2020-01-15 ENCOUNTER — Encounter: Payer: Self-pay | Admitting: Family Medicine

## 2020-01-15 ENCOUNTER — Other Ambulatory Visit: Payer: Self-pay

## 2020-01-15 ENCOUNTER — Ambulatory Visit (INDEPENDENT_AMBULATORY_CARE_PROVIDER_SITE_OTHER): Payer: Medicare HMO | Admitting: Family Medicine

## 2020-01-15 VITALS — BP 152/70 | HR 65 | Temp 97.7°F | Resp 18 | Ht 64.0 in | Wt 133.6 lb

## 2020-01-15 DIAGNOSIS — E1169 Type 2 diabetes mellitus with other specified complication: Secondary | ICD-10-CM

## 2020-01-15 DIAGNOSIS — E785 Hyperlipidemia, unspecified: Secondary | ICD-10-CM

## 2020-01-15 DIAGNOSIS — C61 Malignant neoplasm of prostate: Secondary | ICD-10-CM | POA: Diagnosis not present

## 2020-01-15 DIAGNOSIS — I10 Essential (primary) hypertension: Secondary | ICD-10-CM

## 2020-01-15 DIAGNOSIS — E1165 Type 2 diabetes mellitus with hyperglycemia: Secondary | ICD-10-CM

## 2020-01-15 LAB — LIPID PANEL
Cholesterol: 150 mg/dL (ref 0–200)
HDL: 39.6 mg/dL (ref >39.00)
LDL Cholesterol: 87 mg/dL (ref 0–99)
NonHDL: 110.59
Total CHOL/HDL Ratio: 4
Triglycerides: 117 mg/dL (ref 0.0–149.0)
VLDL: 23.4 mg/dL (ref 0.0–40.0)

## 2020-01-15 LAB — MICROALBUMIN / CREATININE URINE RATIO
Creatinine,U: 122.5 mg/dL
Microalb Creat Ratio: 46.6 mg/g — ABNORMAL HIGH (ref 0.0–30.0)
Microalb, Ur: 57.1 mg/dL — ABNORMAL HIGH (ref 0.0–1.9)

## 2020-01-15 LAB — COMPREHENSIVE METABOLIC PANEL
ALT: 15 U/L (ref 0–53)
AST: 13 U/L (ref 0–37)
Albumin: 4.1 g/dL (ref 3.5–5.2)
Alkaline Phosphatase: 76 U/L (ref 39–117)
BUN: 31 mg/dL — ABNORMAL HIGH (ref 6–23)
CO2: 31 mEq/L (ref 19–32)
Calcium: 9.6 mg/dL (ref 8.4–10.5)
Chloride: 102 mEq/L (ref 96–112)
Creatinine, Ser: 1.5 mg/dL (ref 0.40–1.50)
GFR: 44.38 mL/min — ABNORMAL LOW (ref >60.00)
Glucose, Bld: 168 mg/dL — ABNORMAL HIGH (ref 70–99)
Potassium: 4.4 mEq/L (ref 3.5–5.1)
Sodium: 141 mEq/L (ref 135–145)
Total Bilirubin: 0.5 mg/dL (ref 0.2–1.2)
Total Protein: 7 g/dL (ref 6.0–8.3)

## 2020-01-15 LAB — HEMOGLOBIN A1C: Hgb A1c MFr Bld: 9.6 % — ABNORMAL HIGH (ref 4.6–6.5)

## 2020-01-15 NOTE — Patient Instructions (Signed)
Carbohydrate Counting for Diabetes Mellitus, Adult  Carbohydrate counting is a method of keeping track of how many carbohydrates you eat. Eating carbohydrates naturally increases the amount of sugar (glucose) in the blood. Counting how many carbohydrates you eat helps keep your blood glucose within normal limits, which helps you manage your diabetes (diabetes mellitus). It is important to know how many carbohydrates you can safely have in each meal. This is different for every person. A diet and nutrition specialist (registered dietitian) can help you make a meal plan and calculate how many carbohydrates you should have at each meal and snack. Carbohydrates are found in the following foods:  Grains, such as breads and cereals.  Dried beans and soy products.  Starchy vegetables, such as potatoes, peas, and corn.  Fruit and fruit juices.  Milk and yogurt.  Sweets and snack foods, such as cake, cookies, candy, chips, and soft drinks. How do I count carbohydrates? There are two ways to count carbohydrates in food. You can use either of the methods or a combination of both. Reading "Nutrition Facts" on packaged food The "Nutrition Facts" list is included on the labels of almost all packaged foods and beverages in the [place] It includes:  The serving size.  Information about nutrients in each serving, including the grams (g) of carbohydrate per serving. To use the "Nutrition Facts":  Decide how many servings you will have.  Multiply the number of servings by the number of carbohydrates per serving.  The resulting number is the total amount of carbohydrates that you will be having. Learning standard serving sizes of other foods When you eat carbohydrate foods that are not packaged or do not include "Nutrition Facts" on the label, you need to measure the servings in order to count the amount of carbohydrates:  Measure the foods that you will eat with a food scale or measuring cup, if  needed.  Decide how many standard-size servings you will eat.  Multiply the number of servings by 15. Most carbohydrate-rich foods have about 15 g of carbohydrates per serving. ? For example, if you eat 8 oz (170 g) of strawberries, you will have eaten 2 servings and 30 g of carbohydrates (2 servings x 15 g = 30 g).  For foods that have more than one food mixed, such as soups and casseroles, you must count the carbohydrates in each food that is included. The following list contains standard serving sizes of common carbohydrate-rich foods. Each of these servings has about 15 g of carbohydrates:   hamburger bun or  English muffin.   oz (15 mL) syrup.   oz (14 g) jelly.  1 slice of bread.  1 six-inch tortilla.  3 oz (85 g) cooked rice or pasta.  4 oz (113 g) cooked dried beans.  4 oz (113 g) starchy vegetable, such as peas, corn, or potatoes.  4 oz (113 g) hot cereal.  4 oz (113 g) mashed potatoes or  of a large baked potato.  4 oz (113 g) canned or frozen fruit.  4 oz (120 mL) fruit juice.  4-6 crackers.  6 chicken nuggets.  6 oz (170 g) unsweetened dry cereal.  6 oz (170 g) plain fat-free yogurt or yogurt sweetened with artificial sweeteners.  8 oz (240 mL) milk.  8 oz (170 g) fresh fruit or one small piece of fruit.  24 oz (680 g) popped popcorn. Example of carbohydrate counting Sample meal  3 oz (85 g) chicken breast.  6 oz (170 g)   brown rice.  4 oz (113 g) corn.  8 oz (240 mL) milk.  8 oz (170 g) strawberries with sugar-free whipped topping. Carbohydrate calculation 1. Identify the foods that contain carbohydrates: ? Rice. ? Corn. ? Milk. ? Strawberries. 2. Calculate how many servings you have of each food: ? 2 servings rice. ? 1 serving corn. ? 1 serving milk. ? 1 serving strawberries. 3. Multiply each number of servings by 15 g: ? 2 servings rice x 15 g = 30 g. ? 1 serving corn x 15 g = 15 g. ? 1 serving milk x 15 g = 15 g. ? 1  serving strawberries x 15 g = 15 g. 4. Add together all of the amounts to find the total grams of carbohydrates eaten: ? 30 g + 15 g + 15 g + 15 g = 75 g of carbohydrates total. Summary  Carbohydrate counting is a method of keeping track of how many carbohydrates you eat.  Eating carbohydrates naturally increases the amount of sugar (glucose) in the blood.  Counting how many carbohydrates you eat helps keep your blood glucose within normal limits, which helps you manage your diabetes.  A diet and nutrition specialist (registered dietitian) can help you make a meal plan and calculate how many carbohydrates you should have at each meal and snack. This information is not intended to replace advice given to you by your health care provider. Make sure you discuss any questions you have with your health care provider. Document Revised: 12/16/2016 Document Reviewed: 11/05/2015 Elsevier Patient Education  2020 [employer]  

## 2020-01-15 NOTE — Assessment & Plan Note (Signed)
Tolerating statin, encouraged heart healthy diet, avoid trans fats, minimize simple carbs and saturated fats. Increase exercise as tolerated 

## 2020-01-15 NOTE — Progress Notes (Signed)
Patient ID: [name], male    DOB: 1931-11-19  Age: 86 y.o. MRN: [id number]    Subjective:  Subjective  HPI [name] presents for f/u dm, chol and bp   No complaints  HYPERTENSION   Blood pressure range-good per pt   Chest pain- no      Dyspnea- no Lightheadedness- no   Edema- no  Other side effects - no   Medication compliance: good Low salt diet- yes     DIABETES    Blood Sugar ranges-good per pt   Polyuria- no New Visual problems- no  Hypoglycemic symptoms- no  Other side effects-no Medication compliance - good Last eye exam- recent Foot exam- today   HYPERLIPIDEMIA  Medication compliance- good RUQ pain- no  Muscle aches- no Other side effects-no      Review of Systems Review of Systems  Constitutional: Negative for activity change, appetite change and fatigue.  HENT: Negative for hearing loss, congestion, tinnitus and ear discharge.  dentist q37mEyes: Negative for visual disturbance (see optho q1y --  ) Respiratory: Negative for cough, chest tightness and shortness of breath.   Cardiovascular: Negative for chest pain, palpitations and leg swelling.  Gastrointestinal: Negative for abdominal pain, diarrhea, constipation and abdominal distention.  Genitourinary: Negative for urgency, frequency, decreased urine volume and difficulty urinating.  Musculoskeletal: Negative for back pain, arthralgias and gait problem.  Skin: Negative for color change, pallor and rash.  Neurological: Negative for dizziness, light-headedness, numbness and headaches.  Hematological: Negative for adenopathy. Does not bruise/bleed easily.  Psychiatric/Behavioral: Negative for suicidal ideas, confusion, sleep disturbance, self-injury, dysphoric mood, decreased concentration and agitation.     History Past Medical History:  Diagnosis Date  . CAD (coronary artery disease)    LHC 03/25/11 by Dr. CBurt Knack  pLAD 99%, oCFX 20-30%, pOM1 40%, dAVCFX 20-30%.  EF was normal on nuclear study.   He was treated with a Promus DES to his pLAD.   .Marland KitchenColon polyps 1996   villous adenoma  . DM type 2 (diabetes mellitus, type 2) ([place] 2002  . Fuchs' corneal dystrophy   . Hyperlipidemia   . Hypertension   . Macular degeneration    posterior vitreous vitreous detachment  . Prostate cancer ([facility] dx 2018    He has a past surgical history that includes cataract (Bilateral, 2010); Coronary stent placement (03/25/2011); corenea implants (june and sept 2018); Cardiac catheterization (03/25/2011); Radioactive seed implant (N/A, 05/06/2017); and SPACE OAR INSTILLATION (N/A, 05/06/2017).   His family history includes Coronary artery disease (age of onset: 590 in his father; Stomach cancer (age of onset: 964 in his mother.He reports that he quit smoking about 61 years ago. He has a 0.75 pack-year smoking history. He has never used smokeless tobacco. He reports current alcohol use. He reports that he does not use drugs.  Current Outpatient Medications on File Prior to Visit  Medication Sig Dispense Refill  . Alcohol Swabs PADS Use as directed once a day 100 each 1  . amLODipine (NORVASC) 10 MG tablet Take 1 tablet (10 mg total) by mouth daily. 90 tablet 3  . aspirin 81 MG tablet Take 81 mg by mouth daily.    .Marland Kitchenatorvastatin (LIPITOR) 20 MG tablet Take 1 tablet (20 mg total) by mouth daily. 90 tablet 3  . Blood Glucose Calibration (TRUE METRIX LEVEL 1) Low SOLN Use as directed. 1 each 0  . Blood Glucose Monitoring Suppl (TRUE METRIX AIR GLUCOSE METER) w/Device KIT 1 each by Does not  apply route daily. Use as directed once a day 1 kit 0  . brimonidine-timolol (COMBIGAN) 0.2-0.5 % ophthalmic solution Place 1 drop into the right eye 2 (two) times daily.     . fish oil-omega-3 fatty acids 1000 MG capsule Take 2 g by mouth daily.      [name] glimepiride (AMARYL) 4 MG tablet TAKE 1 TABLET EVERY DAY WITH BREAKFAST 90 tablet 1  . glucose blood (TRUE METRIX BLOOD GLUCOSE TEST) test strip Use as directed once a day.   Dx code: E11.9 100 each 12  . losartan-hydrochlorothiazide (HYZAAR) 100-25 MG tablet TAKE 1 TABLET EVERY DAY 90 tablet 1  . Multiple Vitamin (MULTIVITAMIN) tablet Take 1 tablet by mouth daily.    . SitaGLIPtin-MetFORMIN HCl (JANUMET XR) 50-1000 MG TB24 2 po qd 180 tablet 3  . tamsulosin (FLOMAX) 0.4 MG CAPS capsule TAKE 1 CAPSULE (0.4 MG TOTAL) BY MOUTH DAILY. 90 capsule 3  . TRUEplus Lancets 30G MISC Use as directed once a day.  E11.9 100 each 1   No current facility-administered medications on file prior to visit.     Objective:  Objective  Physical Exam Vitals and nursing note reviewed.  Constitutional:      General: He is sleeping.     Appearance: He is well-developed.  HENT:     Head: Normocephalic and atraumatic.  Eyes:     Pupils: Pupils are equal, round, and reactive to light.  Neck:     Thyroid: No thyromegaly.  Cardiovascular:     Rate and Rhythm: Normal rate and regular rhythm.     Heart sounds: No murmur heard.   Pulmonary:     Effort: Pulmonary effort is normal. No respiratory distress.     Breath sounds: Normal breath sounds. No wheezing or rales.  Chest:     Chest wall: No tenderness.  Musculoskeletal:        General: No tenderness.     Cervical back: Normal range of motion and neck supple.  Skin:    General: Skin is warm and dry.  Neurological:     Mental Status: He is oriented to person, place, and time.  Psychiatric:        Behavior: Behavior normal.        Thought Content: Thought content normal.        Judgment: Judgment normal.    Diabetic Foot Exam - Simple   Simple Foot Form Diabetic Foot exam was performed with the following findings: Yes 01/15/2020  9:32 AM  Visual Inspection No deformities, no ulcerations, no other skin breakdown bilaterally: Yes Sensation Testing Intact to touch and monofilament testing bilaterally: Yes Pulse Check Posterior Tibialis and Dorsalis pulse intact bilaterally: Yes Comments     BP (!) 152/70 (BP Location:  Right Arm, Patient Position: Sitting, Cuff Size: Normal)   Pulse 65   Temp 97.7 F (36.5 C) (Oral)   Resp 18   Ht 5' 4" (1.626 m)   Wt 133 lb 9.6 oz (60.6 kg)   SpO2 95%   BMI 22.93 kg/m  Wt Readings from Last 3 Encounters:  01/15/20 133 lb 9.6 oz (60.6 kg)  11/22/19 137 lb (62.1 kg)  10/26/19 137 lb 6.4 oz (62.3 kg)     Lab Results  Component Value Date   WBC 9.2 10/26/2019   HGB 14.6 10/26/2019   HCT 43.6 10/26/2019   PLT 234 10/26/2019   GLUCOSE 204 (H) 10/26/2019   CHOL 172 10/15/2019   TRIG 141.0 10/15/2019   HDL  37.20 (L) 10/15/2019   LDLCALC 107 (H) 10/15/2019   ALT 16 10/26/2019   AST 14 (L) 10/26/2019   NA 143 10/26/2019   K 3.9 10/26/2019   CL 102 10/26/2019   CREATININE 1.59 (H) 10/26/2019   BUN 23 10/26/2019   CO2 29 10/26/2019   TSH 2.47 10/20/2009   PSA 9.32 (H) 08/30/2016   INR 1.03 05/02/2017   HGBA1C 8.8 (H) 10/15/2019   MICROALBUR 50.7 (H) 04/06/2019    DG Wrist 2 Views Left  Result Date: 05/23/2019 CLINICAL DATA:  Left wrist pain for 2 months following injury EXAM: LEFT WRIST - 2 VIEW COMPARISON:  03/28/2019,  03/20/2019 FINDINGS: Previously seen distal radial and ulnar fractures are again identified with mild increased healing when compared with the prior study. No new fractures are seen. No soft tissue abnormality is noted. IMPRESSION: Healing distal radial and ulnar fractures. Electronically Signed   By: Inez Catalina M.D.   On: 05/23/2019 12:40     Assessment & Plan:  Plan  I am having [name] maintain his fish oil-omega-3 fatty acids, aspirin, multivitamin, brimonidine-timolol, tamsulosin, glucose blood, TRUEplus Lancets 30G, Alcohol Swabs, True Metrix Level 1, True Metrix Air Glucose Meter, glimepiride, losartan-hydrochlorothiazide, amLODipine, atorvastatin, and Janumet XR.  No orders of the defined types were placed in this encounter.   Problem List Items Addressed This Visit      Unprioritized   Essential hypertension    Well  controlled, no changes to meds. Encouraged heart healthy diet such as the DASH diet and exercise as tolerated. --- did not take meds today      Relevant Orders   Hemoglobin A1c   Lipid panel   Comprehensive metabolic panel   Microalbumin / creatinine urine ratio   Hyperlipidemia associated with type 2 diabetes mellitus ([place])    Tolerating statin, encouraged heart healthy diet, avoid trans fats, minimize simple carbs and saturated fats. Increase exercise as tolerated      Relevant Orders   Hemoglobin A1c   Lipid panel   Comprehensive metabolic panel   Microalbumin / creatinine urine ratio   Prostate cancer [facility])    Per urology      Uncontrolled type 2 diabetes mellitus with hyperglycemia ([place]) - Primary    Check labs today      Relevant Orders   Hemoglobin A1c   Lipid panel   Comprehensive metabolic panel   Microalbumin / creatinine urine ratio      Follow-up: Return in about 6 months (around 07/17/2020), or if symptoms worsen or fail to improve, for fasting, annual exam.  [name], DO

## 2020-01-15 NOTE — Assessment & Plan Note (Signed)
Check labs today.

## 2020-01-15 NOTE — Assessment & Plan Note (Signed)
Well controlled, no changes to meds. Encouraged heart healthy diet such as the DASH diet and exercise as tolerated. --- did not take meds today

## 2020-01-15 NOTE — Assessment & Plan Note (Signed)
Per u rology 

## 2020-01-22 NOTE — Addendum Note (Signed)
Addended byDamita Dunnings D on: 01/22/2020 09:34 AM   Modules accepted: Orders

## 2020-01-24 ENCOUNTER — Telehealth: Payer: Self-pay | Admitting: *Deleted

## 2020-01-24 DIAGNOSIS — E785 Hyperlipidemia, unspecified: Secondary | ICD-10-CM

## 2020-01-24 DIAGNOSIS — E1169 Type 2 diabetes mellitus with other specified complication: Secondary | ICD-10-CM

## 2020-01-24 DIAGNOSIS — E1165 Type 2 diabetes mellitus with hyperglycemia: Secondary | ICD-10-CM

## 2020-01-24 DIAGNOSIS — I10 Essential (primary) hypertension: Secondary | ICD-10-CM

## 2020-01-24 MED ORDER — TRUEPLUS LANCETS 33G MISC: 1 refills | Status: DC

## 2020-01-24 MED ORDER — TRUE METRIX LEVEL 3 HIGH VI SOLN: 1 refills | Status: AC

## 2020-01-24 MED ORDER — GLIMEPIRIDE 4 MG PO TABS
ORAL_TABLET | ORAL | 1 refills | Status: DC
Start: 2020-01-24 — End: 2020-04-15

## 2020-01-24 MED ORDER — ATORVASTATIN CALCIUM 20 MG PO TABS: 20.0000 mg | ORAL_TABLET | Freq: Every day | ORAL | 1 refills | Status: DC

## 2020-01-24 MED ORDER — AMLODIPINE BESYLATE 10 MG PO TABS: 10.0000 mg | ORAL_TABLET | Freq: Every day | ORAL | 1 refills | Status: DC

## 2020-01-24 MED ORDER — LOSARTAN POTASSIUM-HCTZ 100-25 MG PO TABS: 1.0000 | ORAL_TABLET | Freq: Every day | ORAL | 1 refills | Status: DC

## 2020-01-24 MED ORDER — ALCOHOL SWABS PADS: MEDICATED_PAD | 1 refills | Status: DC

## 2020-01-24 MED ORDER — JANUMET XR 50-1000 MG PO TB24: 2.0000 | ORAL_TABLET | Freq: Every day | ORAL | 1 refills | Status: DC

## 2020-01-24 MED ORDER — TRUE METRIX METER W/DEVICE KIT: PACK | 0 refills | Status: DC

## 2020-01-24 MED ORDER — TAMSULOSIN HCL 0.4 MG PO CAPS
ORAL_CAPSULE | ORAL | 1 refills | Status: DC
Start: 2020-01-24 — End: 2021-11-18

## 2020-01-24 MED ORDER — TRUE METRIX BLOOD GLUCOSE TEST VI STRP: ORAL_STRIP | 1 refills | Status: DC

## 2020-01-24 NOTE — Telephone Encounter (Signed)
Received additional fax from [facility] for new RXs:  Glimepiride, Janumet XR and Tamsulosin. RXs sent.

## 2020-01-24 NOTE — Addendum Note (Signed)
Addended by: Kelle Darting A on: 01/24/2020 02:35 PM   Modules accepted: Orders

## 2020-01-24 NOTE — Telephone Encounter (Signed)
Received fax from [facility] mail order requesting new RXs for:  Amlodipine, atorvastatin, losartan/hctz, true metrix glucometer, ture metrix test strip, trueplus 33G lancets, tre metrix level 3 control solution and BD single Use swab.  RXs sent.

## 2020-02-04 DIAGNOSIS — H401112 Primary open-angle glaucoma, right eye, moderate stage: Secondary | ICD-10-CM | POA: Diagnosis not present

## 2020-02-04 DIAGNOSIS — H401121 Primary open-angle glaucoma, left eye, mild stage: Secondary | ICD-10-CM | POA: Diagnosis not present

## 2020-02-06 ENCOUNTER — Other Ambulatory Visit: Payer: Self-pay

## 2020-02-06 ENCOUNTER — Ambulatory Visit: Payer: Medicare HMO | Admitting: Internal Medicine

## 2020-02-06 ENCOUNTER — Encounter: Payer: Self-pay | Admitting: Internal Medicine

## 2020-02-06 VITALS — BP 155/57 | HR 70 | Ht 64.0 in | Wt 135.2 lb

## 2020-02-06 DIAGNOSIS — E1121 Type 2 diabetes mellitus with diabetic nephropathy: Secondary | ICD-10-CM

## 2020-02-06 DIAGNOSIS — E1142 Type 2 diabetes mellitus with diabetic polyneuropathy: Secondary | ICD-10-CM | POA: Insufficient documentation

## 2020-02-06 DIAGNOSIS — E1165 Type 2 diabetes mellitus with hyperglycemia: Secondary | ICD-10-CM

## 2020-02-06 DIAGNOSIS — N1832 Chronic kidney disease, stage 3b: Secondary | ICD-10-CM | POA: Diagnosis not present

## 2020-02-06 DIAGNOSIS — E1122 Type 2 diabetes mellitus with diabetic chronic kidney disease: Secondary | ICD-10-CM | POA: Insufficient documentation

## 2020-02-06 HISTORY — DX: Type 2 diabetes mellitus with diabetic polyneuropathy: E11.42

## 2020-02-06 HISTORY — DX: Chronic kidney disease, stage 3b: N18.32

## 2020-02-06 LAB — POCT GLUCOSE (DEVICE FOR HOME USE): Glucose Fasting, POC: 143 mg/dL — AB (ref 70–99)

## 2020-02-06 NOTE — Patient Instructions (Addendum)
-   Please continue Amaryl 4 mg daily before Breakfast  - Please continue Janumet 50-1000 mg TWO tablets with Breakfast          HOW TO TREAT LOW BLOOD SUGARS (Blood sugar LESS THAN 70 MG/DL)  Please follow the RULE OF 15 for the treatment of hypoglycemia treatment (when your (blood sugars are less than 70 mg/dL)    STEP 1: Take 15 grams of carbohydrates when your blood sugar is low, which includes:   3-4 GLUCOSE TABS  OR  3-4 OZ OF JUICE OR REGULAR SODA OR  ONE TUBE OF GLUCOSE GEL     STEP 2: RECHECK blood sugar in 15 MINUTES STEP 3: If your blood sugar is still low at the 15 minute recheck --> then, go back to STEP 1 and treat AGAIN with another 15 grams of carbohydrates.

## 2020-02-06 NOTE — Progress Notes (Signed)
Name: [name]  MRN/ DOB: [id number], [birth year]   Age/ Sex: 86 y.o., male    PCP: Carollee Herter, Alferd Apa, DO   Reason for Endocrinology Evaluation: Type 2 Diabetes Mellitus     Date of Initial Endocrinology Visit: 02/06/2020     PATIENT IDENTIFIER: [name] is a 86 y.o. male with a past medical history of T2DM, CAD and HTN. The patient presented for initial endocrinology clinic visit on 02/06/2020 for consultative assistance with his diabetes management.    HPI: [name] was    Diagnosed with DM in 2008 Prior Medications tried/Intolerance: Janumet started 01/2020.  Currently checking blood sugars 1 x / day Hypoglycemia episodes : no          Hemoglobin A1c has ranged from 6.7% in 2017, peaking at 9.6%in 2021. Patient required assistance for hypoglycemia: no  Patient has required hospitalization within the last 1 year from hyper or hypoglycemia: no   In terms of diet, the patient eats 3 x a day. Does not snack. Avoids sweetened sugar beverages.    HOME DIABETES REGIMEN: Glimepiride 4 mg  Janumet 50-1000 mg 2 tabs daily     Statin: yes ACE-I/ARB: yes Prior Diabetic Education: no    METER DOWNLOAD SUMMARY: Did not bring  150-200 mg/dL     DIABETIC COMPLICATIONS: Microvascular complications:  CKD III Denies: retinopathy, neuropathy  Last eye exam: Completed 01/2020  Macrovascular complications:  CAD ( S/P stent placement)  Denies:  PVD, CVA   PAST HISTORY: Past Medical History:  Past Medical History:  Diagnosis Date  . CAD (coronary artery disease)    LHC 03/25/11 by Dr. Burt Knack:  pLAD 99%, oCFX 20-30%, pOM1 40%, dAVCFX 20-30%.  EF was normal on nuclear study.  He was treated with a Promus DES to his pLAD.   [name] Colon polyps 1996   villous adenoma  . DM type 2 (diabetes mellitus, type 2) ([place]) 2002  . Fuchs' corneal dystrophy   . Hyperlipidemia   . Hypertension   . Macular degeneration    posterior vitreous vitreous detachment  . Prostate cancer [facility])  dx 2018   Past Surgical History:  Past Surgical History:  Procedure Laterality Date  . CARDIAC CATHETERIZATION  03/25/2011  . cataract Bilateral 2010  . corenea implants  june and sept 2018   [name]  . CORONARY STENT PLACEMENT  03/25/2011  . RADIOACTIVE SEED IMPLANT N/A 05/06/2017   Procedure: RADIOACTIVE SEED IMPLANT/BRACHYTHERAPY IMPLANT;  Surgeon: Kathie Rhodes, MD;  Location: [facility];  Service: Urology;  Laterality: N/A;  . SPACE OAR INSTILLATION N/A 05/06/2017   Procedure: SPACE OAR INSTILLATION;  Surgeon: Kathie Rhodes, MD;  Location: [facility];  Service: Urology;  Laterality: N/A;    Social History:  reports that he quit smoking about 61 years ago. He has a 0.75 pack-year smoking history. He has never used smokeless tobacco. He reports current alcohol use. He reports that he does not use drugs. Family History:  Family History  Problem Relation Age of Onset  . Coronary artery disease Father 51       deceased  . Stomach cancer Mother 25     HOME MEDICATIONS: Allergies as of 02/06/2020   No Known Allergies     Medication List       Accurate as of February 06, 2020  8:27 AM. If you have any questions, ask your nurse or doctor.        Alcohol Swabs Pads  Use as directed once a day   amLODipine 10 MG tablet Commonly known as: NORVASC Take 1 tablet (10 mg total) by mouth daily.   aspirin 81 MG tablet Take 81 mg by mouth daily.   atorvastatin 20 MG tablet Commonly known as: LIPITOR Take 1 tablet (20 mg total) by mouth daily.   Combigan 0.2-0.5 % ophthalmic solution Generic drug: brimonidine-timolol Place 1 drop into the right eye 2 (two) times daily.   fish oil-omega-3 fatty acids 1000 MG capsule Take 2 g by mouth daily.   glimepiride 4 MG tablet Commonly known as: AMARYL TAKE 1 TABLET EVERY DAY WITH BREAKFAST   Janumet XR 50-1000 MG Tb24 Generic drug: SitaGLIPtin-MetFORMIN HCl Take 2 tablets by mouth daily.    losartan-hydrochlorothiazide 100-25 MG tablet Commonly known as: HYZAAR Take 1 tablet by mouth daily.   multivitamin tablet Take 1 tablet by mouth daily.   tamsulosin 0.4 MG Caps capsule Commonly known as: FLOMAX TAKE 1 CAPSULE (0.4 MG TOTAL) BY MOUTH DAILY.   True Metrix Blood Glucose Test test strip Generic drug: glucose blood Use as directed once a day.  Dx code: E11.9   True Metrix Level 1 Low Soln Use as directed.   True Metrix Level 3 High Soln Use to check controls on glucometer strips every 30 days or with each new bottle of strips (whichever comes first).   True Metrix Meter w/Device Kit Use to check blood sugar once a day. DX E11.9   TRUEplus Lancets 30G Misc Use as directed once a day.  E11.9   TRUEplus Lancets 33G Misc Use to check blood sugar once a day.  DX  E11.9        ALLERGIES: No Known Allergies   REVIEW OF SYSTEMS: A comprehensive ROS was conducted with the patient and is negative except as per HPI and below:  Review of Systems  Gastrointestinal: Negative for diarrhea and nausea.  Neurological: Negative for headaches.      OBJECTIVE:   VITAL SIGNS: BP (!) 155/57 (BP Location: Right Arm, Patient Position: Sitting, Cuff Size: Normal)   Pulse 70   Ht 5' 4" (1.626 m)   Wt 135 lb 3.2 oz (61.3 kg)   SpO2 96%   BMI 23.21 kg/m    PHYSICAL EXAM:  General: Pt appears well and is in NAD  Neck: General: Supple without adenopathy or carotid bruits. Thyroid: Thyroid size normal.  No goiter or nodules appreciated. No thyroid bruit.  Lungs: Clear with good BS bilat with no rales, rhonchi, or wheezes  Heart: RRR with normal S1 and S2 and no gallops; no murmurs; no rub  Abdomen: Normoactive bowel sounds, soft, nontender, without masses or organomegaly palpable  Extremities:  Lower extremities - No pretibial edema. No lesions.  Skin: Normal texture and temperature to palpation  Neuro: MS is good with appropriate affect, pt is alert and Ox3    DM  foot exam: 02/06/2020  The skin of the feet is intact without sores or ulcerations. The pedal pulses are 1+ on right and 1+ on left. The sensation is decreased to a screening 5.07, 10 gram monofilament on the right    DATA REVIEWED:  Lab Results  Component Value Date   HGBA1C 9.6 (H) 01/15/2020   HGBA1C 8.8 (H) 10/15/2019   HGBA1C 7.3 (H) 04/06/2019   Lab Results  Component Value Date   MICROALBUR 57.1 (H) 01/15/2020   LDLCALC 87 01/15/2020   CREATININE 1.50 01/15/2020   Lab Results  Component Value Date  MICRALBCREAT 46.6 (H) 01/15/2020    Lab Results  Component Value Date   CHOL 150 01/15/2020   HDL 39.60 01/15/2020   LDLCALC 87 01/15/2020   TRIG 117.0 01/15/2020   CHOLHDL 4 01/15/2020        ASSESSMENT / PLAN / RECOMMENDATIONS:   1) Type 2 Diabetes Mellitus, Poorly controlled, With CKD III and neuropathic complications - Most recent A1c of 9.6 %. Goal A1c < 7.0 %.    Plan: GENERAL: I have discussed with the patient the pathophysiology of diabetes. We went over the natural progression of the disease. We talked about both insulin resistance and insulin deficiency. We stressed the importance of lifestyle changes including diet and exercise. I explained the complications associated with diabetes including retinopathy, nephropathy, neuropathy as well as increased risk of cardiovascular disease. We went over the benefit seen with glycemic control.   I explained to the patient that diabetic patients are at higher than normal risk for amputations.  Pt would like a referral to our RD- referral initiated Pt with a low BMI for T2DM. This is something we need to keep in mind, if no improvement, may consider checking for type 1DM  He was recently started on Janumet ~ 10 days ago. He already has noted improvement and No changes will be made today   MEDICATIONS: Continue Amaryl 4 mg daily  Continue Janumet 50-1000 mg Two tabs daily   EDUCATION / INSTRUCTIONS: BG monitoring  instructions: Patient is instructed to check his blood sugars 1 times a day, fasting . Call [facility] if: BG persistently < 70  I reviewed the Rule of 15 for the treatment of hypoglycemia in detail with the patient. Literature supplied.   2) Diabetic complications:  Eye: Does not have known diabetic retinopathy.  Neuro/ Feet: Does  have known diabetic peripheral neuropathy. Renal: Patient does  have known baseline CKD. He is on an ACEI/ARB at present.  3) Dyslipidemia: Patient is on Atorvastatin 20 mg daily .     F/u in 04/2020    Signed electronically by: Mack Guise, MD  [facility] Endocrinology  [address] [place], [place] [address] Phone: [phone] FAX: [phone]   CC: [address] [place] [address] Phone: [phone]  Fax: [phone]    Return to Endocrinology clinic as below: Future Appointments  Date Time Provider [place]  04/15/2020  1:00 PM [facility] PHARMACIST [facility]  04/28/2020  9:30 AM [facility] LAB [facility] None  04/28/2020 10:00 AM Orson Slick, MD [facility] None  07/31/2020  8:40 AM [name], DO [facility]  09/10/2020  8:00 AM Dennis Bast, RN [facility]

## 2020-02-13 ENCOUNTER — Telehealth: Payer: Self-pay | Admitting: Pharmacist

## 2020-02-13 NOTE — Progress Notes (Addendum)
Chronic Care Management Pharmacy Assistant   Name: [name]  MRN: [id number] DOB: [birth year]  Reason for Encounter: Disease State  Patient Questions:  1.  Have you seen any other providers since your last visit? Yes  2.  Any changes in your medicines or health? No   PCP : [name], DO   Their chronic conditions include: Diabetes, HTN, Hyperlipidemia/CAD, BPH, CKD.  Office Visits: 01-15-2020 (PCP) Patient presented in the office with Dr. Carollee Herter for a follow up. Notes from this visit indicate patient is tolerating statin, and was encouraged to incorporate a heart healthy diet. Increasing exercise as tolerated and minimize simple carbs and saturated fats.There were no medication changes indicated.  Consult Visit: 02-04-2020 (Ophthalmology) Patient presented in the office for a follow up exam with Dr. Edilia Bo. Patient received a pressure check. Notes indicate eye pressure was stable without change. Patient was advised to continue Combigan eye drops and reviewed proper drop instillation.  Allergies:  No Known Allergies  Medications: Outpatient Encounter Medications as of 02/13/2020  Medication Sig  . Alcohol Swabs PADS Use as directed once a day  . amLODipine (NORVASC) 10 MG tablet Take 1 tablet (10 mg total) by mouth daily.  [name] aspirin 81 MG tablet Take 81 mg by mouth daily.  [name] atorvastatin (LIPITOR) 20 MG tablet Take 1 tablet (20 mg total) by mouth daily.  . Blood Glucose Calibration (TRUE METRIX LEVEL 1) Low SOLN Use as directed.  . Blood Glucose Calibration (TRUE METRIX LEVEL 3) High SOLN Use to check controls on glucometer strips every 30 days or with each new bottle of strips (whichever comes first).  . Blood Glucose Monitoring Suppl (TRUE METRIX METER) w/Device KIT Use to check blood sugar once a day. DX E11.9  . brimonidine-timolol (COMBIGAN) 0.2-0.5 % ophthalmic solution Place 1 drop into the right eye 2 (two) times daily.   . fish oil-omega-3 fatty acids 1000  MG capsule Take 2 g by mouth daily.    [name] glimepiride (AMARYL) 4 MG tablet TAKE 1 TABLET EVERY DAY WITH BREAKFAST  . glucose blood (TRUE METRIX BLOOD GLUCOSE TEST) test strip Use as directed once a day.  Dx code: E11.9  . losartan-hydrochlorothiazide (HYZAAR) 100-25 MG tablet Take 1 tablet by mouth daily.  . Multiple Vitamin (MULTIVITAMIN) tablet Take 1 tablet by mouth daily.  . SitaGLIPtin-MetFORMIN HCl (JANUMET XR) 50-1000 MG TB24 Take 2 tablets by mouth daily.  . tamsulosin (FLOMAX) 0.4 MG CAPS capsule TAKE 1 CAPSULE (0.4 MG TOTAL) BY MOUTH DAILY.  . TRUEplus Lancets 30G MISC Use as directed once a day.  E11.9  . TRUEplus Lancets 33G MISC Use to check blood sugar once a day.  DX  E11.9   No facility-administered encounter medications on file as of 02/13/2020.    Current Diagnosis: Patient Active Problem List   Diagnosis Date Noted  . Type 2 diabetes mellitus with stage 3b chronic kidney disease, without long-term current use of insulin ([place]) 02/06/2020  . Type 2 diabetes mellitus with diabetic polyneuropathy, without long-term current use of insulin ([place]) 02/06/2020  . Uncontrolled type 2 diabetes mellitus with hyperglycemia ([place]) 04/06/2019  . Hyperlipidemia associated with type 2 diabetes mellitus ([place]) 04/06/2019  . Monoclonal gammopathy 04/06/2019  . Osteoporosis with pathological fracture of forearm 03/21/2019  . Multiple closed fractures of ribs of left side 03/21/2019  . Preventative health care 04/01/2017  . Prostate cancer ([place]) 02/15/2017  . BPH with urinary obstruction 12/13/2012  . CAD (coronary  artery disease) 04/09/2011  . GASTRITIS 05/25/2010  . COLONIC POLYPS, ADENOMATOUS, HX OF 04/20/2010  . ANEMIA 10/27/2009  . 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, without long-term current use of insulin ([place]) 07/04/2006  . Hyperlipidemia 07/04/2006  . Essential hypertension 07/04/2006    Goals Addressed   None    Recent Relevant Labs: Lab Results    Component Value Date/Time   HGBA1C 9.6 (H) 01/15/2020 09:58 AM   HGBA1C 8.8 (H) 10/15/2019 08:30 AM   MICROALBUR 57.1 (H) 01/15/2020 09:58 AM   MICROALBUR 50.7 (H) 04/06/2019 09:34 AM    Kidney Function Lab Results  Component Value Date/Time   CREATININE 1.50 01/15/2020 09:58 AM   CREATININE 1.59 (H) 10/26/2019 08:54 AM   CREATININE 1.29 10/15/2019 08:30 AM   CREATININE 1.46 (H) 07/30/2019 08:07 AM   GFR 44.38 (L) 01/15/2020 09:58 AM   GFRNONAA 39 (L) 10/26/2019 08:54 AM   GFRAA 45 (L) 10/26/2019 08:54 AM    . Current antihyperglycemic regimen:  ? Glimepiride 61m daily o Janumet 50-10052mdaily x 1 week, then increase to twice daily . What recent interventions/DTPs have been made to improve glycemic control:  Patient was encouraged to incorporate a heart healthy diet, minimize simple carbs and saturated fats  At his last visit with PCP on 01-16-2020 . Have there been any recent hospitalizations or ED visits since last visit with CPP? No . Patient denies hypoglycemic symptoms, including None . Patient denies hyperglycemic symptoms, including none . How often are you checking your blood sugar? once daily . What are your blood sugars ranging? Patient states his blood sugar today was 89. o Fasting: 89, 97, 102 are the readings from the past three days.  . During the week, how often does your blood glucose drop below 70? Never . Are you checking your feet daily/regularly? Patient states he does not check his feet regularly, but he hasn't noticed any issues and does not have any concerns.  Patient reports running a half mile once a day and has stopped having dessert after dinner. He believes this has helped his blood sugar numbers decrease. He also reported obtaining and taking his Atorvastatin 2034mab and Janumet 50-1000m11me states he is taking this medication as directed.  Adherence Review: Is the patient currently on a STATIN medication? Yes Is the patient currently on ACE/ARB  medication? No Does the patient have >5 day gap between last estimated fill dates? No    Follow-Up:  Pharmacist Review   IveyFanny SkatesA Shellyrmacist Assistant [phone] by: KaneDe BlancharmD Clinical Pharmacist [facility] at [phone]

## 2020-03-13 ENCOUNTER — Telehealth: Payer: Self-pay | Admitting: Pharmacist

## 2020-03-13 NOTE — Progress Notes (Addendum)
Chronic Care Management Pharmacy Assistant   Name: [name]  MRN: [id number] DOB: [birth year]  Reason for Encounter: Disease State  Patient Questions:  1.  Have you seen any other providers since your last visit? No  2.  Any changes in your medicines or health? No   PCP : [name], DO   Their chronic conditions include: Diabetes, HTN, Hyperlipidemia/CAD, BPH, CKD.  Office Visits: None since their last CCM phone call with the clinical pharmacist assistant.  Consults: None since their last CCM phone call with the clinical pharmacist assistant.  Allergies:  No Known Allergies  Medications: Outpatient Encounter Medications as of 03/13/2020  Medication Sig  . Alcohol Swabs PADS Use as directed once a day  . amLODipine (NORVASC) 10 MG tablet Take 1 tablet (10 mg total) by mouth daily.  [name] aspirin 81 MG tablet Take 81 mg by mouth daily.  [name] atorvastatin (LIPITOR) 20 MG tablet Take 1 tablet (20 mg total) by mouth daily.  . Blood Glucose Calibration (TRUE METRIX LEVEL 1) Low SOLN Use as directed.  . Blood Glucose Calibration (TRUE METRIX LEVEL 3) High SOLN Use to check controls on glucometer strips every 30 days or with each new bottle of strips (whichever comes first).  . Blood Glucose Monitoring Suppl (TRUE METRIX METER) w/Device KIT Use to check blood sugar once a day. DX E11.9  . brimonidine-timolol (COMBIGAN) 0.2-0.5 % ophthalmic solution Place 1 drop into the right eye 2 (two) times daily.   . fish oil-omega-3 fatty acids 1000 MG capsule Take 2 g by mouth daily.    [name] glimepiride (AMARYL) 4 MG tablet TAKE 1 TABLET EVERY DAY WITH BREAKFAST  . glucose blood (TRUE METRIX BLOOD GLUCOSE TEST) test strip Use as directed once a day.  Dx code: E11.9  . losartan-hydrochlorothiazide (HYZAAR) 100-25 MG tablet Take 1 tablet by mouth daily.  . Multiple Vitamin (MULTIVITAMIN) tablet Take 1 tablet by mouth daily.  . SitaGLIPtin-MetFORMIN HCl (JANUMET XR) 50-1000 MG TB24 Take 2  tablets by mouth daily.  . tamsulosin (FLOMAX) 0.4 MG CAPS capsule TAKE 1 CAPSULE (0.4 MG TOTAL) BY MOUTH DAILY.  . TRUEplus Lancets 30G MISC Use as directed once a day.  E11.9  . TRUEplus Lancets 33G MISC Use to check blood sugar once a day.  DX  E11.9   No facility-administered encounter medications on file as of 03/13/2020.    Current Diagnosis: Patient Active Problem List   Diagnosis Date Noted  . Type 2 diabetes mellitus with stage 3b chronic kidney disease, without long-term current use of insulin ([place]) 02/06/2020  . Type 2 diabetes mellitus with diabetic polyneuropathy, without long-term current use of insulin ([place]) 02/06/2020  . Uncontrolled type 2 diabetes mellitus with hyperglycemia ([place]) 04/06/2019  . Hyperlipidemia associated with type 2 diabetes mellitus ([place]) 04/06/2019  . Monoclonal gammopathy 04/06/2019  . Osteoporosis with pathological fracture of forearm 03/21/2019  . Multiple closed fractures of ribs of left side 03/21/2019  . Preventative health care 04/01/2017  . Prostate cancer ([place]) 02/15/2017  . BPH with urinary obstruction 12/13/2012  . CAD (coronary artery disease) 04/09/2011  . GASTRITIS 05/25/2010  . COLONIC POLYPS, ADENOMATOUS, HX OF 04/20/2010  . ANEMIA 10/27/2009  . 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, without long-term current use of insulin ([place]) 07/04/2006  . Hyperlipidemia 07/04/2006  . Essential hypertension 07/04/2006    Goals Addressed   None    Recent Relevant Labs: Lab Results  Component Value Date/Time  HGBA1C 9.6 (H) 01/15/2020 09:58 AM   HGBA1C 8.8 (H) 10/15/2019 08:30 AM   MICROALBUR 57.1 (H) 01/15/2020 09:58 AM   MICROALBUR 50.7 (H) 04/06/2019 09:34 AM    Kidney Function Lab Results  Component Value Date/Time   CREATININE 1.50 01/15/2020 09:58 AM   CREATININE 1.59 (H) 10/26/2019 08:54 AM   CREATININE 1.29 10/15/2019 08:30 AM   CREATININE 1.46 (H) 07/30/2019 08:07 AM   GFR 44.38 (L) 01/15/2020 09:58 AM    GFRNONAA 39 (L) 10/26/2019 08:54 AM   GFRAA 45 (L) 10/26/2019 08:54 AM    . Current antihyperglycemic regimen:  ? *Glimepiride 95m daily o Janumet 50-10088mdaily x 1 week, then increase to twice daily. Patient reports he is taking two tabs of Janumet every morning.  . What recent interventions/DTPs have been made to improve glycemic control:  o None at this time  . Have there been any recent hospitalizations or ED visits since last visit with CPP? No   . Patient denies hypoglycemic symptoms, including None   . Patient denies hyperglycemic symptoms, including none   . How often are you checking your blood sugar? once daily   . What are your blood sugars ranging?  o Fasting: 125, 127, 80, 98, 101, 108  . During the week, how often does your blood glucose drop below 70? Never   . Are you checking your feet daily/regularly? Yes. Patient states he takes really good care of his feet. Reports soaking his feet in epsom salt this morning  Patient reports eating well and having minimum sugar unless he goes to his son's house. He stated when he does have too much sugar he doesn't feel well. Advised that he try to lessen the amount of sugar he eats.  Reminded patient of his appointment with the clinical pharmacist on November 9 at 1pm over the telephone. Also informed him of his appointment with the endocrinologist the same day at 8:30 am in the office.  Adherence Review: Is the patient currently on a STATIN medication? Yes Atorvastatin 20 mg Is the patient currently on ACE/ARB medication? Yes Losartan/hctz Does the patient have >5 day gap between last estimated fill dates? No    Follow-Up:  Pharmacist Review   IvFanny SkatesCMBeaux Arts Villageharmacist Assistant [phone] by: KaDe BlanchPharmD Clinical Pharmacist [facility] at [phone]

## 2020-03-18 ENCOUNTER — Other Ambulatory Visit: Payer: Self-pay | Admitting: Family Medicine

## 2020-03-24 ENCOUNTER — Encounter: Payer: Self-pay | Admitting: Dietician

## 2020-03-24 ENCOUNTER — Other Ambulatory Visit: Payer: Self-pay

## 2020-03-24 ENCOUNTER — Encounter: Payer: Medicare HMO | Attending: Internal Medicine | Admitting: Dietician

## 2020-03-24 DIAGNOSIS — E1165 Type 2 diabetes mellitus with hyperglycemia: Secondary | ICD-10-CM | POA: Diagnosis not present

## 2020-03-24 NOTE — Patient Instructions (Addendum)
Decrease your saturated fat (coconut oil, butter). Consider having your Vitamin B-12 and Vitamin D rechecked Consider Magnesium supplement to see if this helps your leg pain (400 mg) before bed Avoid grapefruit as this interacts with your medication. Consider having fresh fruit rather than juice most often. Variety Continue to stay active.

## 2020-03-24 NOTE — Progress Notes (Signed)
Diabetes Self-Management Education  Visit Type: First/Initial  Appt. Start Time: 1045 Appt. End Time: 1130  03/24/2020  [name], identified by name and date of birth, is a 86 y.o. male with a diagnosis of Diabetes: Type 2.   ASSESSMENT Patient is here today alone. He was referred by Dr. Kelton Pillar  History includes:  Type 2 Diabetes (2008), CAD, HTN A1C 9.6%, GFR 44 01/15/2020 Fasting blood glucose 76-129 and notes 246 the am after a dessert.  Keeps no desserts in his house and overall avoids. Medication:  Glimepiride, Janumet UBW 125 lbs his whole life.   Activity:  Very active (mowing lawn, gardening (90 tomato plants this year), was running 1/2 mile daily and has won Special educational needs teacher for senior races.  Currently stopped due to lower leg pain. Sleep:  7 hours Diet:  3 meals, occasional snacks. Doesn't cook.  They eat out each evening at [facility] or [place] or [place].  He is mindful of his choices.  Enjoys a salad frequently. Only can eat 1/2 his meal and saves the rest for Lunch.  Drinks 1/2-1 cup juice daily.  Meat portion is 2-3 ounces per lunch and dinner He does like butter and takes 1 T coconut oil daily as he heard it was a good thing to do.  He has a 41 yo son that lives with patient.  Retired Chief Financial Officer at age 67.  Weight 126 lb (57.2 kg). Body mass index is 21.63 kg/m.   Diabetes Self-Management Education - 03/24/20 1213      Visit Information   Visit Type First/Initial      Initial Visit   Diabetes Type Type 2    Are you currently following a meal plan? Yes    What type of meal plan do you follow? diabetic    Are you taking your medications as prescribed? Yes    Date Diagnosed 2008      Health Coping   How would you rate your overall health? Good      Psychosocial Assessment   Patient Belief/Attitude about Diabetes Motivated to manage diabetes    Self-care barriers None    Self-management support Doctor's office    Other persons present Patient     Patient Concerns Glycemic Control    Special Needs None    Preferred Learning Style No preference indicated    Learning Readiness Ready    How often do you need to have someone help you when you read instructions, pamphlets, or other written materials from your doctor or pharmacy? 1 - Never    What is the last grade level you completed in school? 1 year college      Pre-Education Assessment   Patient understands the diabetes disease and treatment process. Needs Review    Patient understands incorporating nutritional management into lifestyle. Needs Review    Patient undertands incorporating physical activity into lifestyle. Needs Review    Patient understands using medications safely. Needs Review    Patient understands monitoring blood glucose, interpreting and using results Needs Review    Patient understands prevention, detection, and treatment of acute complications. Needs Review    Patient understands prevention, detection, and treatment of chronic complications. Needs Review    Patient understands how to develop strategies to address psychosocial issues. Needs Review    Patient understands how to develop strategies to promote health/change behavior. Needs Review      Complications   Last HgB A1C per patient/outside source 9.6 %   01/15/2020  How often do you check your blood sugar? 1-2 times/day    Fasting Blood glucose range (mg/dL) 70-129    Number of hypoglycemic episodes per month 0    Number of hyperglycemic episodes per week 0    Have you had a dilated eye exam in the past 12 months? Yes    Have you had a dental exam in the past 12 months? Yes    Are you checking your feet? Yes    How many days per week are you checking your feet? 7      Dietary Intake   Breakfast Cream of buckwheat, almond milk, berries OR egg and berries    Lunch Leftovers from dinner, fresh tomato when in season    Snack (afternoon) natural peanut butter or nuts    Dinner salad with goat cheese OR  fish, sweet potato OR other - not fried    Beverage(s) water, 1/2-1 cup juice per day, occasional coffee with cream      Exercise   Exercise Type Light (walking / raking leaves)    How many days per week to you exercise? 7    How many minutes per day do you exercise? 30    Total minutes per week of exercise 210      Patient Education   Previous Diabetes Education No    Disease state  Definition of diabetes, type 1 and 2, and the diagnosis of diabetes   insulin resistance and insulin insufficiency   Nutrition management  Meal options for control of blood glucose level and chronic complications.;Information on hints to eating out and maintain blood glucose control.    Physical activity and exercise  Role of exercise on diabetes management, blood pressure control and cardiac health.    Medications Reviewed patients medication for diabetes, action, purpose, timing of dose and side effects.    Monitoring Identified appropriate SMBG and/or A1C goals.    Acute complications Taught treatment of hypoglycemia - the 15 rule.    Psychosocial adjustment Identified and addressed patients feelings and concerns about diabetes      Individualized Goals (developed by patient)   Nutrition General guidelines for healthy choices and portions discussed    Physical Activity Exercise 5-7 days per week;30 minutes per day    Medications take my medication as prescribed    Monitoring  test my blood glucose as discussed    Reducing Risk increase portions of healthy fats;do foot checks daily    Health Coping discuss diabetes with (comment)   MD, RD, CDCES     Post-Education Assessment   Patient understands the diabetes disease and treatment process. Demonstrates understanding / competency    Patient understands incorporating nutritional management into lifestyle. Demonstrates understanding / competency    Patient undertands incorporating physical activity into lifestyle. Demonstrates understanding / competency     Patient understands using medications safely. Demonstrates understanding / competency    Patient understands monitoring blood glucose, interpreting and using results Demonstrates understanding / competency    Patient understands prevention, detection, and treatment of acute complications. Demonstrates understanding / competency    Patient understands prevention, detection, and treatment of chronic complications. Demonstrates understanding / competency    Patient understands how to develop strategies to address psychosocial issues. Demonstrates understanding / competency    Patient understands how to develop strategies to promote health/change behavior. Demonstrates understanding / competency      Outcomes   Expected Outcomes Demonstrated interest in learning. Expect positive outcomes    Future DMSE  PRN    Program Status Completed           Individualized Plan for Diabetes Self-Management Training:   Learning Objective:  Patient will have a greater understanding of diabetes self-management. Patient education plan is to attend individual and/or group sessions per assessed needs and concerns.   Plan:   Patient Instructions  Decrease your saturated fat (coconut oil, butter). Consider having your Vitamin B-12 and Vitamin D rechecked Consider Magnesium supplement to see if this helps your leg pain (400 mg) before bed Avoid grapefruit as this interacts with your medication. Consider having fresh fruit rather than juice most often. Variety Continue to stay active.   Expected Outcomes:  Demonstrated interest in learning. Expect positive outcomes  Education material provided:   If problems or questions, patient to contact team via:  Phone, email  Future DSME appointment: PRN

## 2020-04-04 ENCOUNTER — Telehealth: Payer: Self-pay | Admitting: Pharmacist

## 2020-04-04 DIAGNOSIS — E1122 Type 2 diabetes mellitus with diabetic chronic kidney disease: Secondary | ICD-10-CM | POA: Diagnosis not present

## 2020-04-04 DIAGNOSIS — C9000 Multiple myeloma not having achieved remission: Secondary | ICD-10-CM | POA: Diagnosis not present

## 2020-04-04 DIAGNOSIS — N1832 Chronic kidney disease, stage 3b: Secondary | ICD-10-CM | POA: Diagnosis not present

## 2020-04-04 DIAGNOSIS — I129 Hypertensive chronic kidney disease with stage 1 through stage 4 chronic kidney disease, or unspecified chronic kidney disease: Secondary | ICD-10-CM | POA: Diagnosis not present

## 2020-04-04 NOTE — Progress Notes (Addendum)
Verified Adherence Gap Information. Per insurance data, the patient is 100% compliant with his Atorvastatin 20 mg last fill date 03-03-2020, HCTZ-Losartan 25-100 mg last fill date 01-25-2020, Metformin 1000 mg and Janumet XR 50 mg last fill date 03-03-2020.  Fanny Skates, [place] Pharmacist Assistant [phone]  Reviewed by: De Blanch, PharmD Clinical Pharmacist [facility] at [facility] [phone]

## 2020-04-15 ENCOUNTER — Ambulatory Visit: Payer: Medicare HMO | Admitting: Pharmacist

## 2020-04-15 ENCOUNTER — Encounter: Payer: Self-pay | Admitting: Internal Medicine

## 2020-04-15 ENCOUNTER — Ambulatory Visit (INDEPENDENT_AMBULATORY_CARE_PROVIDER_SITE_OTHER): Payer: Medicare HMO | Admitting: Internal Medicine

## 2020-04-15 ENCOUNTER — Other Ambulatory Visit: Payer: Self-pay

## 2020-04-15 VITALS — BP 126/72 | HR 69 | Ht 64.0 in | Wt 130.0 lb

## 2020-04-15 DIAGNOSIS — E1142 Type 2 diabetes mellitus with diabetic polyneuropathy: Secondary | ICD-10-CM

## 2020-04-15 DIAGNOSIS — E1169 Type 2 diabetes mellitus with other specified complication: Secondary | ICD-10-CM

## 2020-04-15 DIAGNOSIS — E1159 Type 2 diabetes mellitus with other circulatory complications: Secondary | ICD-10-CM | POA: Diagnosis not present

## 2020-04-15 DIAGNOSIS — E785 Hyperlipidemia, unspecified: Secondary | ICD-10-CM

## 2020-04-15 DIAGNOSIS — I10 Essential (primary) hypertension: Secondary | ICD-10-CM

## 2020-04-15 DIAGNOSIS — E119 Type 2 diabetes mellitus without complications: Secondary | ICD-10-CM | POA: Insufficient documentation

## 2020-04-15 DIAGNOSIS — E1122 Type 2 diabetes mellitus with diabetic chronic kidney disease: Secondary | ICD-10-CM | POA: Diagnosis not present

## 2020-04-15 DIAGNOSIS — N1832 Chronic kidney disease, stage 3b: Secondary | ICD-10-CM

## 2020-04-15 LAB — POCT GLYCOSYLATED HEMOGLOBIN (HGB A1C): Hemoglobin A1C: 6.6 % — AB (ref 4.0–5.6)

## 2020-04-15 MED ORDER — GLIMEPIRIDE 2 MG PO TABS: 2.0000 mg | ORAL_TABLET | Freq: Every day | ORAL | 1 refills | Status: DC

## 2020-04-15 NOTE — Progress Notes (Signed)
Name: [name]  Age/ Sex: 86 y.o., male   MRN/ DOB: [id number], 1931/09/19     PCP: Carollee Herter, Alferd Apa, DO   Reason for Endocrinology Evaluation: Type 2 Diabetes Mellitus  Initial Endocrine Consultative Visit: 02/06/2020    PATIENT IDENTIFIER: [name] is a 86 y.o. male with a past medical history of T2DM, CAD and HTN. The patient has followed with Endocrinology clinic since 02/06/2020 for consultative assistance with management of his diabetes.  DIABETIC HISTORY:  [name] was diagnosed with DM in 2008, has been on Glimepiride for years, janumet started in 01/2020. His hemoglobin A1c has ranged from 6.7% in 2017, peaking at 9.6%in 2021.   On initial visit his A1c was 9.6 % , he was just started on Janumet and we did not make any changes and continued Glimepiride.   SUBJECTIVE:   During the last visit (02/06/2020): A1c 9.6 %. We continued Glimepiride and Janumet   Today (04/15/2020): [name] is here for a follow up on diabetes management.  He checks his blood sugars 1 times daily, preprandial to breakfast. The patient has had hypoglycemic episodes since the last clinic visit, which typically occur 1 x /month - most often occuring fasting. The patient is  symptomatic with these episodes.   Denies nausea or diarrhea Denies leg numbness Has been hydrating well.     HOME DIABETES REGIMEN:  Glimepiride 4 mg daily  Janumet 50-1000 mg, 2 tabs daily     Statin: yes ACE-I/ARB: yes   GLUCOSE LOG: 58- 246 mg/dL   DIABETIC COMPLICATIONS: Microvascular complications:  CKD III Denies: retinopathy, neuropathy Last Eye Exam: Completed 01/2020  Macrovascular complications:  CAD ( S/P stent) Denies:  CVA, PVD   HISTORY:  Past Medical History:  Past Medical History:  Diagnosis Date   CAD (coronary artery disease)    LHC 03/25/11 by Dr. Burt Knack:  pLAD 99%, oCFX 20-30%, pOM1 40%, dAVCFX 20-30%.  EF was normal on nuclear study.  He was treated with a Promus DES to his  pLAD.    Colon polyps 1996   villous adenoma   DM type 2 (diabetes mellitus, type 2) ([place]) 2002   Fuchs' corneal dystrophy    Hyperlipidemia    Hypertension    Macular degeneration    posterior vitreous vitreous detachment   Prostate cancer ([facility]) dx 2018   Past Surgical History:  Past Surgical History:  Procedure Laterality Date   CARDIAC CATHETERIZATION  03/25/2011   cataract Bilateral 2010   corenea implants  june and sept 2018   [name]   CORONARY STENT PLACEMENT  03/25/2011   RADIOACTIVE SEED IMPLANT N/A 05/06/2017   Procedure: RADIOACTIVE SEED IMPLANT/BRACHYTHERAPY IMPLANT;  Surgeon: Kathie Rhodes, MD;  Location: [place];  Service: Urology;  Laterality: N/A;   SPACE OAR INSTILLATION N/A 05/06/2017   Procedure: SPACE OAR INSTILLATION;  Surgeon: Kathie Rhodes, MD;  Location: [facility];  Service: Urology;  Laterality: N/A;   Social History:  reports that he quit smoking about 61 years ago. He has a 0.75 pack-year smoking history. He has never used smokeless tobacco. He reports current alcohol use. He reports that he does not use drugs. Family History:  Family History  Problem Relation Age of Onset   Coronary artery disease Father 30       deceased   Stomach cancer Mother 3     HOME MEDICATIONS: Allergies as of 04/15/2020   No Known Allergies  Medication List       Accurate as of April 15, 2020  8:26 AM. If you have any questions, ask your nurse or doctor.        Alcohol Swabs Pads Use as directed once a day   amLODipine 10 MG tablet Commonly known as: NORVASC Take 1 tablet (10 mg total) by mouth daily.   aspirin 81 MG tablet Take 81 mg by mouth daily.   atorvastatin 20 MG tablet Commonly known as: LIPITOR Take 1 tablet (20 mg total) by mouth daily.   Combigan 0.2-0.5 % ophthalmic solution Generic drug: brimonidine-timolol Place 1 drop into the right eye 2 (two) times daily.   fish  oil-omega-3 fatty acids 1000 MG capsule Take 2 g by mouth daily.   glimepiride 4 MG tablet Commonly known as: AMARYL TAKE 1 TABLET EVERY DAY WITH BREAKFAST   Janumet XR 50-1000 MG Tb24 Generic drug: SitaGLIPtin-MetFORMIN HCl Take 2 tablets by mouth daily.   losartan-hydrochlorothiazide 100-25 MG tablet Commonly known as: HYZAAR Take 1 tablet by mouth daily.   multivitamin tablet Take 1 tablet by mouth daily.   tamsulosin 0.4 MG Caps capsule Commonly known as: FLOMAX TAKE 1 CAPSULE (0.4 MG TOTAL) BY MOUTH DAILY.   True Metrix Blood Glucose Test test strip Generic drug: glucose blood USE AS DIRECTED ONCE A DAY   True Metrix Level 3 High Soln Use to check controls on glucometer strips every 30 days or with each new bottle of strips (whichever comes first).   True Metrix Level 1 Low Soln USE AS DIRECTED   True Metrix Meter w/Device Kit USE AS DIRECTED ONCE A DAY   TRUEplus Lancets 33G Misc Use to check blood sugar once a day.  DX  E11.9   TRUEplus Lancets 30G Misc USE AS DIRECTED ONCE A DAY        OBJECTIVE:   Vital Signs: BP 126/72    Pulse 69    Ht _0  (1.626 m)    Wt 130 lb (59 kg)    SpO2 97%    BMI 22.31 kg/m   Wt Readings from Last 3 Encounters:  04/15/20 130 lb (59 kg)  03/24/20 126 lb (57.2 kg)  02/06/20 135 lb 3.2 oz (61.3 kg)     Exam: General: Pt appears well and is in NAD  Lungs: Clear with good BS bilat with no rales, rhonchi, or wheezes  Heart: RRR with normal S1 and S2 and no gallops; no murmurs; no rub  Abdomen: Normoactive bowel sounds, soft, nontender, without masses or organomegaly palpable  Extremities: No pretibial edema..  Neuro: MS is good with appropriate affect, pt is alert and Ox3   DM foot exam: 02/06/2020  The skin of the feet is intact without sores or ulcerations. The pedal pulses are 1+ on right and 1+ on left. The sensation is decreased to a screening 5.07, 10 gram monofilament on the right          DATA  REVIEWED:  Lab Results  Component Value Date   HGBA1C 6.6 (A) 04/15/2020   HGBA1C 9.6 (H) 01/15/2020   HGBA1C 8.8 (H) 10/15/2019   Lab Results  Component Value Date   MICROALBUR 57.1 (H) 01/15/2020   LDLCALC 87 01/15/2020   CREATININE 1.50 01/15/2020   Lab Results  Component Value Date   MICRALBCREAT 46.6 (H) 01/15/2020     Lab Results  Component Value Date   CHOL 150 01/15/2020   HDL 39.60 01/15/2020   LDLCALC 87 01/15/2020  TRIG 117.0 01/15/2020   CHOLHDL 4 01/15/2020         ASSESSMENT / PLAN / RECOMMENDATIONS:   1) Type 2 Diabetes Mellitus, With CKD III and neuropathic and macrovascular complications - Most recent A1c of 6.6 %. Goal A1c < 7.0 %.      - Pt with hypoglycemic episodes , will reduce Glimepiride as below -He is tolerating the Janumet without any side effects, we will continue to monitor his GFR, and adjust Janumet appropriately. -He was recently seen by nephrology, per patient creatinine level is improving     MEDICATIONS: Decrease glimepiride to 2 mg daily Continue Janumet 50-1000 mg, 2 tabs daily  EDUCATION / INSTRUCTIONS: BG monitoring instructions: Patient is instructed to check his blood sugars 1 times a day. Call [facility] if: BG persistently < 70  I reviewed the Rule of 15 for the treatment of hypoglycemia in detail with the patient. Literature supplied.    2) Diabetic complications:  Eye: Does not have known diabetic retinopathy.  Neuro/ Feet: Does have known diabetic peripheral neuropathy .  Renal: Patient does  have known baseline CKD. He   is  on an ACEI/ARB at present.    F/U in 4 months   Signed electronically by: Mack Guise, MD  [facility]  [address] [facility] [address] [place], [place] [address] Phone: [phone] FAX: [phone]   CC: [address] [place] [address] Phone: [phone]  Fax:  [phone]  Return to Endocrinology clinic as below: Future Appointments  Date Time Provider [place]  04/15/2020  8:30 AM Keyara Ent, Melanie Crazier, MD [facility]  04/15/2020  1:00 PM [facility] PHARMACIST [facility]  04/28/2020  9:30 AM [facility] None  04/28/2020 10:00 AM Orson Slick, MD [facility] None  07/31/2020  8:40 AM [name], DO [facility]  09/10/2020  8:00 AM Dennis Bast, RN [facility]

## 2020-04-15 NOTE — Chronic Care Management (AMB) (Signed)
Chronic Care Management Pharmacy  Name: [name]  MRN: [id number] DOB: January 14, 1932  Chief Complaint/ HPI  [name],  86 y.o. , male presents for their Follow-Up CCM visit with the clinical pharmacist via telephone due to COVID-19 Pandemic   PCP : [name], DO  Their chronic conditions include: Diabetes, HTN, Hyperlipidemia/CAD, BPH, CKD  Office Visits: 01/15/20: Visit w/ Dr. Etter Sjogren - Referral to endo  Consult Visit: 04/15/20: Endo visit w/ Dr. Kelton Pillar - A1c to goal! A1c 6.6% with start of Janumet. Did have hypoglycemic episodes so glimepiride reduced to 47m daily. Continue Janumet.   02/06/20: Endo visit w/ Dr. SKelton Pillar- referral to RD. Noted improvement on Janument started about 10 days ago. No med changes made.   02/04/20: Ophthalmology visit w/ Dr. BEdilia Bo Medications: Outpatient Encounter Medications as of 04/15/2020  Medication Sig  . Alcohol Swabs PADS Use as directed once a [name]  . amLODipine (NORVASC) 10 MG tablet Take 1 tablet (10 mg total) by mouth daily.  .Marland Kitchenaspirin 81 MG tablet Take 81 mg by mouth daily.  .Marland Kitchenatorvastatin (LIPITOR) 20 MG tablet Take 1 tablet (20 mg total) by mouth daily.  . Blood Glucose Calibration (TRUE METRIX LEVEL 1) Low SOLN USE AS DIRECTED  . Blood Glucose Calibration (TRUE METRIX LEVEL 3) High SOLN Use to check controls on glucometer strips every 30 days or with each new bottle of strips (whichever comes first).  . Blood Glucose Monitoring Suppl (TRUE METRIX METER) w/Device KIT USE AS DIRECTED ONCE A [name]  . brimonidine-timolol (COMBIGAN) 0.2-0.5 % ophthalmic solution Place 1 drop into the right eye 2 (two) times daily.   . fish oil-omega-3 fatty acids 1000 MG capsule Take 2 g by mouth daily.    .Marland Kitchenglimepiride (AMARYL) 2 MG tablet Take 1 tablet (2 mg total) by mouth daily before breakfast.  . glucose blood (TRUE METRIX BLOOD GLUCOSE TEST) test strip USE AS DIRECTED ONCE A [name]  . losartan-hydrochlorothiazide (HYZAAR) 100-25 MG tablet  Take 1 tablet by mouth daily.  . Multiple Vitamin (MULTIVITAMIN) tablet Take 1 tablet by mouth daily.  . SitaGLIPtin-MetFORMIN HCl (JANUMET XR) 50-1000 MG TB24 Take 2 tablets by mouth daily.  . tamsulosin (FLOMAX) 0.4 MG CAPS capsule TAKE 1 CAPSULE (0.4 MG TOTAL) BY MOUTH DAILY.  . TRUEplus Lancets 30G MISC USE AS DIRECTED ONCE A [name]  . TRUEplus Lancets 33G MISC Use to check blood sugar once a [name].  DX  E11.9  . [DISCONTINUED] glimepiride (AMARYL) 4 MG tablet TAKE 1 TABLET EVERY [name] WITH BREAKFAST   No facility-administered encounter medications on file as of 04/15/2020.   SDOH Screenings   Alcohol Screen:   . Last Alcohol Screening Score (AUDIT): Not on file  Depression (PHQ2-9): Low Risk   . PHQ-2 Score: 0  Financial Resource Strain: Low Risk   . Difficulty of Paying Living Expenses: Not hard at all  Food Insecurity: No Food Insecurity  . Worried About RCharity fundraiserin the Last Year: Never true  . Ran Out of Food in the Last Year: Never true  Housing: Low Risk   . Last Housing Risk Score: 0  Physical Activity:   . Days of Exercise per Week: Not on file  . Minutes of Exercise per Session: Not on file  Social Connections:   . Frequency of Communication with Friends and Family: Not on file  . Frequency of Social Gatherings with Friends and Family: Not on file  .  Attends Religious Services: Not on file  . Active Member of Clubs or Organizations: Not on file  . Attends Archivist Meetings: Not on file  . Marital Status: Not on file  Stress:   . Feeling of Stress : Not on file  Tobacco Use: Medium Risk  . Smoking Tobacco Use: Former Smoker  . Smokeless Tobacco Use: Never Used  Transportation Needs: No Transportation Needs  . Lack of Transportation (Medical): No  . Lack of Transportation (Non-Medical): No     Current Diagnosis/Assessment:  Goals Addressed            This Visit's Progress   . Chronic Care Management Pharmacy Care Plan       CARE PLAN  ENTRY (see longitudinal plan of care for additional care plan information)  Current Barriers:  . Chronic Disease Management support, education, and care coordination needs related to Diabetes, HTN, Hyperlipidemia/CAD, BPH, CKD   Hypertension BP Readings from Last 3 Encounters:  04/15/20 126/72  02/06/20 (!) 155/57  01/15/20 (!) 152/70   . Pharmacist Clinical Goal(s): o Over the next 90 days, patient will work with PharmD and providers to achieve BP goal <140/90 . Current regimen:  o Amlodipine 10 mg daily o Losartan/HCTZ 100-25 mg daily  . Interventions: o Requested patient to check his blood pressure 3-4 times per week and record o Discussed BP goal and importance of monitoring BP o Reinforced importance of adherence . Patient self care activities - Over the next 90 days, patient will: o Check BP 3-4 times per week, document, and provide at future appointments o Ensure daily salt intake < 2300 mg/[name]  Hyperlipidemia Lab Results  Component Value Date/Time   LDLCALC 87 01/15/2020 09:58 AM   . Pharmacist Clinical Goal(s): o Over the next 90 days, patient will work with PharmD and providers to achieve LDL goal < 70 . Current regimen:  o Atorvastatin 51m daily . Interventions: o Get refill of atorvastatin for patient  o Discussed importance of medication adherence  . Patient self care activities - Over the next 90 days, patient will: o Maintain cholesterol medication regimen.   Diabetes Lab Results  Component Value Date/Time   HGBA1C 6.6 (A) 04/15/2020 08:17 AM   HGBA1C 9.6 (H) 01/15/2020 09:58 AM   HGBA1C 8.8 (H) 10/15/2019 08:30 AM   . Pharmacist Clinical Goal(s): o Over the next 90 days, patient will work with PharmD and providers to maintain A1c goal <7% . Current regimen:  o Glimepiride 278mdaily o Janumet 50-100032mwice daily . Interventions: o Requested patient to check blood sugar daily and record o Collaboration with provider regarding medication  management (Janumet titration) o Consider starting Janumet XR 50-1000 once daily for a week then increasing to Janumet XR 50-1000 twice daily o A1c to goal!!! . Patient self care activities - Over the next 90 days, patient will: o Check blood sugar once daily, document, and provide at future appointments o Contact provider with any episodes of hypoglycemia  Osteoporosis Screening . Pharmacist Clinical Goal(s) o Over the next 90 days, patient will work with PharmD and providers to reduce risk of fracture due to osteopenia/osteoporosis . Current regimen:  o None . Interventions: o Consider daily intake of 1200m47m calcium daily through diet or supplementation o Consider daily intake of [phone] units of vitamin D through supplementation  o Consider getting DEXA (bone density scan) for further assessment . Patient self care activities - Over the next 90 days, patient will: o Increase intake  to 126m of calcium daily through diet and/or supplementation o Increase intake to [phone] units of vitamin D daily through supplementation o Complete DEXA Scan per Dr. LEtter Sjogren Medication management . Pharmacist Clinical Goal(s): o Over the next 90 days, patient will work with PharmD and providers to achieve optimal medication adherence . Current pharmacy: [facility] . Interventions o Comprehensive medication review performed. o Continue current medication management strategy . Patient self care activities - Over the next 90 days, patient will: o Focus on medication adherence by filling and taking medications appropriately  o Take medications as prescribed o Report any questions or concerns to PharmD and/or provider(s)  Please see past updates related to this goal by clicking on the "Past Updates" button in the selected goal       Social Hx: Wife passed away in 225-Feb-2016 He loves gardening, especially tomatoes.  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  He has 3 sons and  4 grandchildren. All of his children are successful and he is very proud of them.   Diabetes   A1c goal <7%  Recent Relevant Labs: Lab Results  Component Value Date/Time   HGBA1C 6.6 (A) 04/15/2020 08:17 AM   HGBA1C 9.6 (H) 01/15/2020 09:58 AM   HGBA1C 8.8 (H) 10/15/2019 08:30 AM   MICROALBUR 57.1 (H) 01/15/2020 09:58 AM   MICROALBUR 50.7 (H) 04/06/2019 09:34 AM    From 11/22/19 CCM Visit  Checking BG: Rarely   Patient has failed these meds in past: None noted  Patient is currently controlled on the following medications:   Glimepiride 2 mg daily (decreased today per endo)  Sitagliptin-metformin XR [phone] mg daily   Glimepiride and sitagliptin-metformin not in dispense report. Patient does not recall having these medications at home.  Called patient back after his office visit and he confirms he is not taking Janumet. States he takes glimepiride even though it is not listed in dispense report.  He states pricking his fingers hurts and this limits him from checking his BG.   Interested in getting CGM.   Denies any polyuria, polydipsia, or polyphagia.   Diet Lots of fish and greens Drinks lots of water (attributes this to him not having a headache in over 10 years)  Last diabetic Eye exam:  Lab Results  Component Value Date/Time   HMDIABEYEEXA No Retinopathy 08/28/2015 12:00 AM    Last diabetic Foot exam:  Lab Results  Component Value Date/Time   HMDIABFOOTEX done 10/23/2013 12:00 AM    Assessed patient's feet. There are no blisters or calluses. Did not use monofilament for full foot exam.   We discussed: importance of diabetes control and medication adherence   Update 11/29/19 Patient checked his BG for the past week Recent FBG Readings:  174 188 168 197 190 181 180 Average 182.5  Patient is aware that these BG are above goal. He states he got frustrated with receiving too many mail order prescriptions so he told them to take him off of automatic  refill and may have forgotten to get the Janumet refilled.   Will work with getting Janumet restarted per Dr. LEtter Sjogren Pt states he would [facility] since [facility]  Update 12/11/19 184 124 149 126 173 148 166 179 189  178 153 154 Average 160.25  Eating better. Avocado daily. English crumpet w/ 2 sunny side up eggs plus coffee with cream, but not sugar for breakfast . Minimal  bread. Buckwheat cereal. Eating fish. Asparagus Brocoli Brussel Sprouts, salads with vinegarette daily  Has not received new Janumet script  Running in the evening about 1/2 mile.   Update 01/14/20 Received Janumet? No: Not in dispense report and pt states he has not picked up the medication. Dexcom: Cost prohibitive; $180 initial plus $130/90 DS. Pt states this is not affordable for him. FBG: 266 179 179 190 173 157 Average 190.6  Called pharmacy after visit and confirmed that both scripts (Janumet and atorvastatin) were received. Technician stated they would be ready this evening. Called patient to inform him. He verbalized understanding.   Update 04/15/20 Patient's a1c at goal! Congratulated patient on this! States he is eating well. States he is drinking more water. He is drinking #3 20oz bottles of water. Feels hydration is really helping him. Tries not to eat after 6pm.  States he feels good.  No longer has tingling in feet.  Pt is not home. Can not report any specific readings.   Plan -Continue checking fasting blood sugar daily and record -Continue current medications  Future Plan -See if Freestyle Libre available through pharmacy coverage.   Hypertension   BP goal <140/90  CMP Latest Ref Rng & Units 01/15/2020 10/26/2019 10/15/2019  Glucose 70 - 99 mg/dL 168(H) 204(H) 208(H)  BUN 6 - 23 mg/dL 31(H) 23 24(H)  Creatinine 0.40 - 1.50 mg/dL 1.50 1.59(H) 1.29  Sodium 135 - 145 mEq/L 141 143 140  Potassium 3.5 - 5.1 mEq/L 4.4 3.9 4.6  Chloride 96 -  112 mEq/L 102 102 103  CO2 19 - 32 mEq/L 31 29 32  Calcium 8.4 - 10.5 mg/dL 9.6 9.5 9.2  Total Protein 6.0 - 8.3 g/dL 7.0 7.2 6.4  Total Bilirubin 0.2 - 1.2 mg/dL 0.5 0.8 0.6  Alkaline Phos 39 - 117 U/L 76 101 89  AST 0 - 37 U/L 13 14(L) 12  ALT 0 - 53 U/L 15 16 13    Kidney Function Lab Results  Component Value Date/Time   CREATININE 1.50 01/15/2020 09:58 AM   CREATININE 1.59 (H) 10/26/2019 08:54 AM   CREATININE 1.29 10/15/2019 08:30 AM   CREATININE 1.46 (H) 07/30/2019 08:07 AM   GFR 44.38 (L) 01/15/2020 09:58 AM   GFRNONAA 39 (L) 10/26/2019 08:54 AM   GFRAA 45 (L) 10/26/2019 08:54 AM   K 4.4 01/15/2020 09:58 AM   K 3.9 10/26/2019 08:54 AM   Office blood pressures are  BP Readings from Last 3 Encounters:  04/15/20 126/72  02/06/20 (!) 155/57  01/15/20 (!) 152/70   Patient feels he has white coat syndrome  Patient has failed these meds in the past: None noted  Patient is currently uncontrolled, but improving on the following medications:   Amlodipine 10 mg daily   Losartan/HCTZ 100-25 mg daily   Per Dispense Report Amlodipine: 10/15/19 - 90 DS Losartan/hctz: 06/28/19 - 90DS  Patient checks BP at home infrequently   Amlodipine 22m: 94 tabs remaining at home per patient Losartan/hctz 100-270m 50 tabs remaining remaining at home per patient  [facility].  We discussed importance of medication adherence and monitoring BP as well as BP goal  Update 11/29/19 States he left his other BP readings at home as he is preparing to go to lunch with friends.  He reports his BP today was 123/59 with is usually BP being in the 11156Fystolic.   Update 12/11/19 Patient home BP readings are ranging: 156 65 61 156 67  62 144 64 61 163 64 60 174 57 59 173 64 62 166 69 58 157 69 65 154 69 62 178 63 60  143 65 68 173 70 63 Average 161.4 65.5 61.7  Denies headache and chest pain. Reinforced adherence. Believe elevated bp is due to  adherence.   Update 01/14/20 127 61 63 164 69 62 145 65 64  140 77 68 120 63 65 149 70 65 Average 140.8 67.5 64.5  Update 04/15/20 Pt is not home. Can not report any specific readings, but states his BP is normal.  Plan -Continue to check BP 3-4 times per week and record -Continue current medications   Hyperlipidemia/CAD   LDL goal < 70  Lipid Panel     Component Value Date/Time   CHOL 150 01/15/2020 0958   TRIG 117.0 01/15/2020 0958   HDL 39.60 01/15/2020 0958   LDLCALC 87 01/15/2020 0958    Hepatic Function Latest Ref Rng & Units 01/15/2020 10/26/2019 10/15/2019  Total Protein 6.0 - 8.3 g/dL 7.0 7.2 6.4  Albumin 3.5 - 5.2 g/dL 4.1 4.0 4.1  AST 0 - 37 U/L 13 14(L) 12  ALT 0 - 53 U/L 15 16 13   Alk Phosphatase 39 - 117 U/L 76 101 89  Total Bilirubin 0.2 - 1.2 mg/dL 0.5 0.8 0.6  Bilirubin, Direct 0.0 - 0.3 mg/dL - - -     The ASCVD Risk score (Machias., et al., 2013) failed to calculate for the following reasons:   The 2013 ASCVD risk score is only valid for ages 25 to 63   Patient has failed these meds in past: simvastatin (intxn with amlodipine) Patient is currently uncontrolled on the following medications:   Aspirin 81 mg daily  Atorvastatin 20 mg daily  Fish Oil 2g daily  Atorvastatin not listed in dispense report  Patient reports not having this at home.  We discussed:  importance of medication adherence and LDL goal noting hx of CAD   Update 01/14/20 Receive atorvastatin script? No  Called pharmacy after visit and confirmed that both scripts (Janumet and atorvastatin) were received. Technician stated they would be ready this evening. Called patient to inform him. He verbalized understanding.   Update 04/15/20 Receive atorvastatin script? Unsure if he received. He is not currently home.  Asked patient to call me back to confirm if he has atorvastatin at home.   Plan -Update lipid panel if atorvastatin has indeed been started by patient.  -Continue  current medications    De Blanch, PharmD Clinical Pharmacist [facility] [phone]

## 2020-04-15 NOTE — Patient Instructions (Signed)
Visit Information  Goals Addressed            This Visit's Progress    Chronic Care Management Pharmacy Care Plan       CARE PLAN ENTRY (see longitudinal plan of care for additional care plan information)  Current Barriers:   Chronic Disease Management support, education, and care coordination needs related to Diabetes, HTN, Hyperlipidemia/CAD, BPH, CKD   Hypertension BP Readings from Last 3 Encounters:  04/15/20 126/72  02/06/20 (!) 155/57  01/15/20 (!) 152/70    Pharmacist Clinical Goal(s): o Over the next 90 days, patient will work with PharmD and providers to achieve BP goal <140/90  Current regimen:  o Amlodipine 10 mg daily o Losartan/HCTZ 100-25 mg daily   Interventions: o Requested patient to check his blood pressure 3-4 times per week and record o Discussed BP goal and importance of monitoring BP o Reinforced importance of adherence  Patient self care activities - Over the next 90 days, patient will: o Check BP 3-4 times per week, document, and provide at future appointments o Ensure daily salt intake < 2300 mg/Yuliana Vandrunen  Hyperlipidemia Lab Results  Component Value Date/Time   [facility] 01/15/2020 09:58 AM    Pharmacist Clinical Goal(s): o Over the next 90 days, patient will work with PharmD and providers to achieve LDL goal < 70  Current regimen:  o Atorvastatin 20mg  daily  Interventions: o Get refill of atorvastatin for patient  o Discussed importance of medication adherence   Patient self care activities - Over the next 90 days, patient will: o Maintain cholesterol medication regimen.   Diabetes Lab Results  Component Value Date/Time   HGBA1C 6.6 (A) 04/15/2020 08:17 AM   HGBA1C 9.6 (H) 01/15/2020 09:58 AM   HGBA1C 8.8 (H) 10/15/2019 08:30 AM    Pharmacist Clinical Goal(s): o Over the next 90 days, patient will work with PharmD and providers to maintain A1c goal <7%  Current regimen:  o Glimepiride 2mg  daily o Janumet 50-1000mg  twice  daily  Interventions: o Requested patient to check blood sugar daily and record o Collaboration with provider regarding medication management (Janumet titration) o Consider starting Janumet XR 50-1000 once daily for a week then increasing to Janumet XR 50-1000 twice daily o A1c to goal!!!  Patient self care activities - Over the next 90 days, patient will: o Check blood sugar once daily, document, and provide at future appointments o Contact provider with any episodes of hypoglycemia  Osteoporosis Screening  Pharmacist Clinical Goal(s) o Over the next 90 days, patient will work with PharmD and providers to reduce risk of fracture due to osteopenia/osteoporosis  Current regimen:  o None  Interventions: o Consider daily intake of 1200mg  of calcium daily through diet or supplementation o Consider daily intake of [phone] units of vitamin D through supplementation  o Consider getting DEXA (bone density scan) for further assessment  Patient self care activities - Over the next 90 days, patient will: o Increase intake to 1200mg  of calcium daily through diet and/or supplementation o Increase intake to [phone] units of vitamin D daily through supplementation o Complete DEXA Scan per Dr. Etter Sjogren  Medication management  Pharmacist Clinical Goal(s): o Over the next 90 days, patient will work with PharmD and providers to achieve optimal medication adherence  Current pharmacy: [facility]  Interventions o Comprehensive medication review performed. o Continue current medication management strategy  Patient self care activities - Over the next 90 days, patient will: o Focus on medication adherence by  filling and taking medications appropriately  o Take medications as prescribed o Report any questions or concerns to PharmD and/or provider(s)  Please see past updates related to this goal by clicking on the "Past Updates" button in the selected goal         Patient verbalizes  understanding of instructions provided today.   Telephone follow up appointment with pharmacy team member scheduled for: 08/01/2020  De Blanch, PharmD Clinical Pharmacist [facility] [phone]

## 2020-04-15 NOTE — Patient Instructions (Signed)
-   Decrease Amaryl 2 mg daily before Breakfast  - Please continue Janumet 50-1000 mg TWO tablets with Breakfast        HOW TO TREAT LOW BLOOD SUGARS (Blood sugar LESS THAN 70 MG/DL)  Please follow the RULE OF 15 for the treatment of hypoglycemia treatment (when your (blood sugars are less than 70 mg/dL)    STEP 1: Take 15 grams of carbohydrates when your blood sugar is low, which includes:   3-4 GLUCOSE TABS  OR  3-4 OZ OF JUICE OR REGULAR SODA OR  ONE TUBE OF GLUCOSE GEL     STEP 2: RECHECK blood sugar in 15 MINUTES STEP 3: If your blood sugar is still low at the 15 minute recheck --> then, go back to STEP 1 and treat AGAIN with another 15 grams of carbohydrates.

## 2020-04-28 ENCOUNTER — Encounter: Payer: Self-pay | Admitting: Hematology and Oncology

## 2020-04-28 ENCOUNTER — Other Ambulatory Visit: Payer: Self-pay

## 2020-04-28 ENCOUNTER — Inpatient Hospital Stay: Payer: Medicare HMO | Attending: Hematology and Oncology | Admitting: Hematology and Oncology

## 2020-04-28 ENCOUNTER — Other Ambulatory Visit: Payer: Self-pay | Admitting: Hematology and Oncology

## 2020-04-28 ENCOUNTER — Inpatient Hospital Stay: Payer: Medicare HMO

## 2020-04-28 VITALS — BP 120/61 | HR 76 | Temp 97.4°F | Resp 16 | Ht 64.0 in | Wt 128.2 lb

## 2020-04-28 DIAGNOSIS — Z87891 Personal history of nicotine dependence: Secondary | ICD-10-CM | POA: Insufficient documentation

## 2020-04-28 DIAGNOSIS — D472 Monoclonal gammopathy: Secondary | ICD-10-CM

## 2020-04-28 DIAGNOSIS — I10 Essential (primary) hypertension: Secondary | ICD-10-CM | POA: Insufficient documentation

## 2020-04-28 DIAGNOSIS — C61 Malignant neoplasm of prostate: Secondary | ICD-10-CM | POA: Diagnosis not present

## 2020-04-28 DIAGNOSIS — D649 Anemia, unspecified: Secondary | ICD-10-CM | POA: Insufficient documentation

## 2020-04-28 DIAGNOSIS — Z7982 Long term (current) use of aspirin: Secondary | ICD-10-CM | POA: Insufficient documentation

## 2020-04-28 DIAGNOSIS — I2510 Atherosclerotic heart disease of native coronary artery without angina pectoris: Secondary | ICD-10-CM | POA: Insufficient documentation

## 2020-04-28 DIAGNOSIS — E1136 Type 2 diabetes mellitus with diabetic cataract: Secondary | ICD-10-CM | POA: Insufficient documentation

## 2020-04-28 DIAGNOSIS — E785 Hyperlipidemia, unspecified: Secondary | ICD-10-CM | POA: Diagnosis not present

## 2020-04-28 DIAGNOSIS — Z79899 Other long term (current) drug therapy: Secondary | ICD-10-CM | POA: Insufficient documentation

## 2020-04-28 DIAGNOSIS — Z7984 Long term (current) use of oral hypoglycemic drugs: Secondary | ICD-10-CM | POA: Insufficient documentation

## 2020-04-28 DIAGNOSIS — Z23 Encounter for immunization: Secondary | ICD-10-CM | POA: Diagnosis not present

## 2020-04-28 DIAGNOSIS — C9000 Multiple myeloma not having achieved remission: Secondary | ICD-10-CM | POA: Insufficient documentation

## 2020-04-28 LAB — CBC WITH DIFFERENTIAL (CANCER CENTER ONLY)
Abs Immature Granulocytes: 0.02 10*3/uL (ref 0.00–0.07)
Basophils Absolute: 0.1 10*3/uL (ref 0.0–0.1)
Basophils Relative: 1 %
Eosinophils Absolute: 0.2 10*3/uL (ref 0.0–0.5)
Eosinophils Relative: 2 %
HCT: 37 % — ABNORMAL LOW (ref 39.0–52.0)
Hemoglobin: 12.2 g/dL — ABNORMAL LOW (ref 13.0–17.0)
Immature Granulocytes: 0 %
Lymphocytes Relative: 22 %
Lymphs Abs: 2.3 10*3/uL (ref 0.7–4.0)
MCH: 30.3 pg (ref 26.0–34.0)
MCHC: 33 g/dL (ref 30.0–36.0)
MCV: 92 fL (ref 80.0–100.0)
Monocytes Absolute: 0.9 10*3/uL (ref 0.1–1.0)
Monocytes Relative: 9 %
Neutro Abs: 7.2 10*3/uL (ref 1.7–7.7)
Neutrophils Relative %: 66 %
Platelet Count: 243 10*3/uL (ref 150–400)
RBC: 4.02 MIL/uL — ABNORMAL LOW (ref 4.22–5.81)
RDW: 13.6 % (ref 11.5–15.5)
WBC Count: 10.8 10*3/uL — ABNORMAL HIGH (ref 4.0–10.5)
nRBC: 0 % (ref 0.0–0.2)

## 2020-04-28 LAB — CMP (CANCER CENTER ONLY)
ALT: 11 U/L (ref 0–44)
AST: 12 U/L — ABNORMAL LOW (ref 15–41)
Albumin: 4 g/dL (ref 3.5–5.0)
Alkaline Phosphatase: 85 U/L (ref 38–126)
Anion gap: 8 (ref 5–15)
BUN: 31 mg/dL — ABNORMAL HIGH (ref 8–23)
CO2: 29 mmol/L (ref 22–32)
Calcium: 9.7 mg/dL (ref 8.9–10.3)
Chloride: 104 mmol/L (ref 98–111)
Creatinine: 1.52 mg/dL — ABNORMAL HIGH (ref 0.61–1.24)
GFR, Estimated: 44 mL/min — ABNORMAL LOW (ref >60)
Glucose, Bld: 129 mg/dL — ABNORMAL HIGH (ref 70–99)
Potassium: 4.3 mmol/L (ref 3.5–5.1)
Sodium: 141 mmol/L (ref 135–145)
Total Bilirubin: 0.7 mg/dL (ref 0.3–1.2)
Total Protein: 7 g/dL (ref 6.5–8.1)

## 2020-04-28 LAB — LACTATE DEHYDROGENASE: LDH: 288 U/L — ABNORMAL HIGH (ref 98–192)

## 2020-04-28 MED ORDER — INFLUENZA VAC A&B SA ADJ QUAD 0.5 ML IM PRSY
0.5000 mL | PREFILLED_SYRINGE | Freq: Once | INTRAMUSCULAR | Status: AC
  Administered 2020-04-28: 0.5 mL via INTRAMUSCULAR

## 2020-04-28 MED ORDER — INFLUENZA VAC A&B SA ADJ QUAD 0.5 ML IM PRSY
PREFILLED_SYRINGE | INTRAMUSCULAR | Status: AC
  Filled 2020-04-28: qty 0.5

## 2020-04-29 LAB — MULTIPLE MYELOMA PANEL, SERUM
Albumin SerPl Elph-Mcnc: 3.8 g/dL (ref 2.9–4.4)
Albumin/Glob SerPl: 1.4 (ref 0.7–1.7)
Alpha 1: 0.2 g/dL (ref 0.0–0.4)
Alpha2 Glob SerPl Elph-Mcnc: 0.8 g/dL (ref 0.4–1.0)
B-Globulin SerPl Elph-Mcnc: 0.9 g/dL (ref 0.7–1.3)
Gamma Glob SerPl Elph-Mcnc: 0.9 g/dL (ref 0.4–1.8)
Globulin, Total: 2.8 g/dL (ref 2.2–3.9)
IgA: 135 mg/dL (ref 61–437)
IgG (Immunoglobin G), Serum: 946 mg/dL (ref 603–1613)
IgM (Immunoglobulin M), Srm: 49 mg/dL (ref 15–143)
Total Protein ELP: 6.6 g/dL (ref 6.0–8.5)

## 2020-04-29 LAB — BETA 2 MICROGLOBULIN, SERUM: Beta-2 Microglobulin: 3.7 mg/L — ABNORMAL HIGH (ref 0.6–2.4)

## 2020-04-29 LAB — KAPPA/LAMBDA LIGHT CHAINS
Kappa free light chain: 32.9 mg/L — ABNORMAL HIGH (ref 3.3–19.4)
Kappa, lambda light chain ratio: 0.06 — ABNORMAL LOW (ref 0.26–1.65)
Lambda free light chains: 531 mg/L — ABNORMAL HIGH (ref 5.7–26.3)

## 2020-04-30 ENCOUNTER — Telehealth: Payer: Self-pay | Admitting: *Deleted

## 2020-04-30 ENCOUNTER — Telehealth: Payer: Self-pay | Admitting: Hematology and Oncology

## 2020-04-30 NOTE — Telephone Encounter (Signed)
Scheduled per 11/22 los. Called and spoke with pt, confirmed 5/23 appts

## 2020-04-30 NOTE — Telephone Encounter (Signed)
TCT patient regarding lab results.  Spoke with patient and advised that his Multiple Myeloma labs are stable. Reminded pt of appt in 6 months. He has an appt in May 2022. Pt voiced understanding.

## 2020-04-30 NOTE — Telephone Encounter (Signed)
-----  Message from Orson Slick, MD sent at 04/30/2020  2:10 PM EST ----- Please let Mr. Beyene know that all his multiple myeloma labs looks stable. We will see him back in 6 months time.  ----- Message ----- From: Buel Ream, [facility] [place] Sent: 04/28/2020   9:03 AM EST To: Orson Slick, MD

## 2020-05-04 NOTE — Progress Notes (Signed)
[place] Telephone:([phone]   [phone]  PROGRESS NOTE  Patient Care Team: Carollee Herter, Alferd Apa, DO as PCP - General (Family Medicine) Kathie Rhodes, MD (Inactive) as Consulting Physician (Urology) Tyler Pita, MD as Consulting Physician (Radiation Oncology) Marilynne Halsted, MD as Referring Physician (Ophthalmology) Day, Melvenia Beam, [facility] as Pharmacist (Pharmacist) Orson Slick, MD as Consulting Physician (Hematology and Oncology)  Hematological/Oncological History #Smoldering Multiple Myeloma, Intermediate Risk 1) 02/15/19: found to have ambda chains 645.5, kappa 27.9 with a ratio of 0.04, no serum M protein during nephrology work up.  2) 04/06/19: establish care with Dr. Lorenso Courier 3) 04/06/19: SPEP shows no serum monoclonal protein, however high levels of M protein detected in the urine. B2Microglobulin at 3.3. Bone Survey negative.  4)04/23/19: Bone Marrow biopsy performed, confirmed 10-15% clonal plasma cells (9% in aspirate). Confirmed diagnosis of Smoldering multiple myeloma, intermediate risk. WBC 8.6, Hgb 12.4 5) 10/30/2019: WBC 9.2, Hgb 14.6, Cr 1.59, Kappa 29.4, Lambda 597.1, ratio 0.05.   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 Gleason Score of 3+4 1) 05/06/17:Insertion of radioactive I-125 seeds into the prostate glandwith placement of SpaceOAR;145Gy, definitive therapy. Radiation oncologist was Dr. Tammi Klippel at [facility] 2) Subsequently followed by outside urology with serial PSA measurements. Reported last measurement in Oct 2020 at PSA 0.2.   Interval History:  [name] 86 y.o. male with medical history significant for smoldering multiple myeloma and prostate cancer presents for a follow up visit. He was last seen on 5/21/202 for a routine f/u. In the interim since his last visit [name] has had no changes in medications, no hospitalizations or emergency room visits.  On exam today [name] reports he has been well in the interim  since our last visit approximately 6 months ago.  He notes that he has been drinking a lot of water and taking magnesium because has been having some issues with leg cramps.  He notes that he is out in the yard at the time and continues to run 50 to 100 m dashwithout difficulty.  He notes that his energy is good, his appetite is good, and he is having no issues with bone pain or back pain.  He has lost a few pounds but otherwise reports that he feels good.  He has no questions, concerns, or complaints today.  On further discussion he denies any bleeding, bruising, dark stools, shortness of breath, fatigue, lightheadedness, dizziness or new bone or back pain.  A full 10 point ROS is listed below.  MEDICAL HISTORY:  Past Medical History:  Diagnosis Date   CAD (coronary artery disease)    LHC 03/25/11 by Dr. Burt Knack:  pLAD 99%, oCFX 20-30%, pOM1 40%, dAVCFX 20-30%.  EF was normal on nuclear study.  He was treated with a Promus DES to his pLAD.    Colon polyps 1996   villous adenoma   DM type 2 (diabetes mellitus, type 2) ([place]) 2002   Fuchs' corneal dystrophy    Hyperlipidemia    Hypertension    Macular degeneration    posterior vitreous vitreous detachment   Prostate cancer ([place]) dx 2018    SURGICAL HISTORY: Past Surgical History:  Procedure Laterality Date   CARDIAC CATHETERIZATION  03/25/2011   cataract Bilateral 2010   corenea implants  june and sept 2018   [name]   CORONARY STENT PLACEMENT  03/25/2011   RADIOACTIVE SEED IMPLANT N/A 05/06/2017   Procedure: RADIOACTIVE SEED IMPLANT/BRACHYTHERAPY IMPLANT;  Surgeon: Kathie Rhodes, MD;  Location:  [facility];  Service: Urology;  Laterality: N/A;   SPACE OAR INSTILLATION N/A 05/06/2017   Procedure: SPACE OAR INSTILLATION;  Surgeon: Kathie Rhodes, MD;  Location: [facility];  Service: Urology;  Laterality: N/A;    SOCIAL HISTORY: Social History   Socioeconomic History   Marital  status: Widowed    Spouse name: Not on file   Number of children: Not on file   Years of education: Not on file   Highest education level: Not on file  Occupational History   Not on file  Tobacco Use   Smoking status: Former Smoker    Packs/day: 0.25    Years: 3.00    Pack years: 0.75    Quit date: 06/07/1958    Years since quitting: 61.9   Smokeless tobacco: Never Used  Vaping Use   Vaping Use: Never used  Substance and Sexual Activity   Alcohol use: Yes    Comment: 1 glass wine per day   Drug use: No   Sexual activity: Not Currently  Other Topics Concern   Not on file  Social History Narrative   Not on file   Social Determinants of Health   Financial Resource Strain: Low Risk    Difficulty of Paying Living Expenses: Not hard at all  Food Insecurity: No Food Insecurity   Worried About Charity fundraiser in the Last Year: Never true   [place] in the Last Year: Never true  Transportation Needs: No Transportation Needs   Lack of Transportation (Medical): No   Lack of Transportation (Non-Medical): No  Physical Activity:    Days of Exercise per Week: Not on file   Minutes of Exercise per Session: Not on file  Stress:    Feeling of Stress : Not on file  Social Connections:    Frequency of Communication with Friends and Family: Not on file   Frequency of Social Gatherings with Friends and Family: Not on file   Attends Religious Services: Not on file   Active Member of Clubs or Organizations: Not on file   Attends Archivist Meetings: Not on file   Marital Status: Not on file  Intimate Partner Violence:    Fear of Current or Ex-Partner: Not on file   Emotionally Abused: Not on file   Physically Abused: Not on file   Sexually Abused: Not on file    FAMILY HISTORY: Family History  Problem Relation Age of Onset   Coronary artery disease Father 19       deceased   Stomach cancer Mother 43    ALLERGIES:  has No Known  Allergies.  MEDICATIONS:  Current Outpatient Medications  Medication Sig Dispense Refill   amLODipine (NORVASC) 5 MG tablet 10 mg daily.     Alcohol Swabs PADS Use as directed once a day 100 each 1   aspirin 81 MG tablet Take 81 mg by mouth daily.     atorvastatin (LIPITOR) 20 MG tablet Take 1 tablet (20 mg total) by mouth daily. 90 tablet 1   Blood Glucose Calibration (TRUE METRIX LEVEL 1) Low SOLN USE AS DIRECTED 1 each 3   Blood Glucose Calibration (TRUE METRIX LEVEL 3) High SOLN Use to check controls on glucometer strips every 30 days or with each new bottle of strips (whichever comes first). 3 each 1   Blood Glucose Monitoring Suppl (TRUE METRIX METER) w/Device KIT USE AS DIRECTED ONCE A DAY 1 kit 0   brimonidine-timolol (COMBIGAN)  0.2-0.5 % ophthalmic solution Place 1 drop into the right eye 2 (two) times daily.      fish oil-omega-3 fatty acids 1000 MG capsule Take 2 g by mouth daily.       glimepiride (AMARYL) 2 MG tablet Take 1 tablet (2 mg total) by mouth daily before breakfast. 90 tablet 1   glucose blood (TRUE METRIX BLOOD GLUCOSE TEST) test strip USE AS DIRECTED ONCE A DAY 100 strip 3   losartan-hydrochlorothiazide (HYZAAR) 100-25 MG tablet Take 1 tablet by mouth daily. 90 tablet 1   Multiple Vitamin (MULTIVITAMIN) tablet Take 1 tablet by mouth daily.     SitaGLIPtin-MetFORMIN HCl (JANUMET XR) 50-1000 MG TB24 Take 2 tablets by mouth daily. 180 tablet 1   tamsulosin (FLOMAX) 0.4 MG CAPS capsule TAKE 1 CAPSULE (0.4 MG TOTAL) BY MOUTH DAILY. 90 capsule 1   TRUEplus Lancets 30G MISC USE AS DIRECTED ONCE A DAY 100 each 3   TRUEplus Lancets 33G MISC Use to check blood sugar once a day.  DX  E11.9 100 each 1   No current facility-administered medications for this visit.    REVIEW OF SYSTEMS:   Constitutional: ( - ) fevers, ( - )  chills , ( - ) night sweats Eyes: ( - ) blurriness of vision, ( - ) double vision, ( - ) watery eyes Ears, nose, mouth, throat, and face:  ( - ) mucositis, ( - ) sore throat Respiratory: ( - ) cough, ( - ) dyspnea, ( - ) wheezes Cardiovascular: ( - ) palpitation, ( - ) chest discomfort, ( - ) lower extremity swelling Gastrointestinal:  ( - ) nausea, ( - ) heartburn, ( - ) change in bowel habits Skin: ( - ) abnormal skin rashes Lymphatics: ( - ) new lymphadenopathy, ( - ) easy bruising Neurological: ( - ) numbness, ( - ) tingling, ( - ) new weaknesses Behavioral/Psych: ( - ) mood change, ( - ) new changes  All other systems were reviewed with the patient and are negative.  PHYSICAL EXAMINATION: ECOG PERFORMANCE STATUS: 0 - Asymptomatic  Vitals:   04/28/20 0934  BP: 120/61  Pulse: 76  Resp: 16  Temp: (!) 97.4 F (36.3 C)  SpO2: 99%   Filed Weights   04/28/20 0934  Weight: 128 lb 3.2 oz (58.2 kg)    GENERAL: well appearing elderly Caucasian male, alert, no distress and comfortable SKIN: skin color, texture, turgor are normal, no rashes or significant lesions EYES: conjunctiva are pink and non-injected, sclera clear LUNGS: clear to auscultation and percussion with normal breathing effort HEART: regular rate & rhythm and no murmurs and no lower extremity edema Musculoskeletal: no cyanosis of digits and no clubbing  PSYCH: alert & oriented x 3, fluent speech NEURO: no focal motor/sensory deficits  LABORATORY DATA:  I have reviewed the data as listed  CMP Latest Ref Rng & Units 04/28/2020 01/15/2020 10/26/2019  Glucose 70 - 99 mg/dL 129(H) 168(H) 204(H)  BUN 8 - 23 mg/dL 31(H) 31(H) 23  Creatinine 0.61 - 1.24 mg/dL 1.52(H) 1.50 1.59(H)  Sodium 135 - 145 mmol/L 141 141 143  Potassium 3.5 - 5.1 mmol/L 4.3 4.4 3.9  Chloride 98 - 111 mmol/L 104 102 102  CO2 22 - 32 mmol/L _0 Calcium 8.9 - 10.3 mg/dL 9.7 9.6 9.5  Total Protein 6.5 - 8.1 g/dL 7.0 7.0 7.2  Total Bilirubin 0.3 - 1.2 mg/dL 0.7 0.5 0.8  Alkaline Phos 38 - 126 U/L 85 76  101  AST 15 - 41 U/L 12(L) 13 14(L)  ALT 0 - 44 U/L _0 CBC Latest  Ref Rng & Units 04/28/2020 10/26/2019 07/30/2019  WBC 4.0 - 10.5 K/uL 10.8(H) 9.2 10.0  Hemoglobin 13.0 - 17.0 g/dL 12.2(L) 14.6 13.9  Hematocrit 39 - 52 % 37.0(L) 43.6 42.2  Platelets 150 - 400 K/uL 243 234 224     Surgical Pathology  CASE: [id number]  PATIENT: [place] Odonnel  Bone Marrow Report   Clinical History: MGUS, left posterior iliac crest, (ADC)   DIAGNOSIS:   BONE MARROW, ASPIRATE, CLOT, CORE:  - Plasma cell myeloma, see comment.  - No amyloid deposits.  - Minimal iron stores.   PERIPHERAL BLOOD:  - Normocytic anemia.   COMMENT:   The marrow is mildly hypercellular with increased monotypic plasma cells  (9% aspirate, 10-15% CD138 immunohistochemistry). There are no amyloid  deposits seen with Congo red, although there is a lack of larger vessels  in the core biopsy. The findings are consistent with plasma cell  myeloma.   MICROSCOPIC DESCRIPTION:   PERIPHERAL BLOOD SMEAR: There is a normocytic anemia with scattered  elliptocytes/ovalocytes. There is no rouleaux formation. Leukocytes  are present in normal numbers. Circulating plasma cells are not  identified. Platelets are present in normal numbers.   BONE MARROW ASPIRATE: Spicular cellular.  Erythroid precursors:Present in appropriate proportions. No  significant dysplasia.  Granulocytic precursors: Present in appropriate proportions. No  significant dysplasia. No increase in blasts.  Megakaryocytes: Present and largely unremarkable.  Lymphocytes/plasma cells: There is a mild increase in plasma cells (9%  by manual differential counts) with scattered atypical forms (large  forms, binucleation). Lymphocytes are not increased.   TOUCH PREPARATIONS: Similar to aspirate smears.   CLOT AND BIOPSY: The core biopsies and clot sections are mildly  hypercellular for age (33 to 30%). CD138 immunohistochemistry reveals  increased plasma cells (10 to 15%) scattered in small clusters. By  light chain in  situ hybridization plasma cells are lambda restricted.  Myeloid and erythroid elements are present in appropriate proportions.  Megakaryocytes exhibit a spectrum of maturation without tight clusters.  There are few benign appearing lymphoid aggregates. Congo red is  negative for amyloid deposits.   IRON STAIN: Iron stains are performed on a bone marrow aspirate or touch  imprint smear and section of clot. The controls stained appropriately.     Storage Iron: Minimal.    Ring Sideroblasts: Absent.   ADDITIONAL DATA/TESTING: Cytogenetics, including FISH for myeloma, was  ordered and will be reported separately.   CELL COUNT DATA:   Bone Marrow count performed on 500 cells shows:  Blasts:  0%  Myeloid: 47%  Promyelocytes: 0%  Erythroid:   23%  Myelocytes:  5%  Lymphocytes:  17%  Metamyelocytes:   0%  Plasma cells: 9%  Bands:  16%  Neutrophils:  22% M:E ratio:   2.04  Eosinophils:  4%  Basophils:   0%  Monocytes:   4%   Lab Data: CBC performed on 04/23/2019 shows:  WBC: 8.6 k/uL Neutrophils:  71%  Hgb: 12.4 g/dL Lymphocytes:  25%  HCT: 38.0 %  Monocytes:   3%  MCV: 92.7 fL  Eosinophils:  0%  RDW: 13.8 %  Basophils:   1%  PLT: 221 k/uL   GROSS DESCRIPTION:   A: Aspirate smear   B: The specimen is received in B-plus fixative and consists of a 21.0 x  12.0 x 5.0 mm aggregate of red-brown clotted  blood. The specimen is  entirely submitted in cassette B.   C: The specimen is received in B-plus fixative and consists of 2 cores  of tan bone, measuring 0.6 and 0.9 cm in length by 0.2 cm in diameter.  The specimen is entirely submitted in cassette C. Craig Staggers 04/25/2019)   Final Diagnosis performed by Vicente Males, MD.  Electronically signed  04/25/2019  Technical and / or Professional components performed at [facility], [place] [address] [place], [place] [address].  Immunohistochemistry Technical component (if  applicable) was performed  at [facility]. [address] [place],  [place], [place] [address].  IMMUNOHISTOCHEMISTRY DISCLAIMER (if applicable):  Some of these immunohistochemical stains may have been developed and the  performance characteristics determine by [facility]. Some  may not have been cleared or approved by the U.S. Food and Drug  Administration. The FDA has determined that such clearance or approval  is not necessary. This test is used for clinical purposes. It should not  be regarded as investigational or for research. This laboratory is  certified under the [place]  (CLIA-88) as qualified to perform high complexity clinical laboratory  testing. The controls stained appropriately.   RADIOGRAPHIC STUDIES: No relevant radiographic studies.   ASSESSMENT & PLAN ARRICK DUTTON 86 y.o. male with medical history significant for CAD s/p PCI in 2012, DM type II, Prostate cancer s/p brachytherapy, and HTN who presents for follow up for his smoldering multiple myeloma.   On exam today [name] continues to do well.  He continues to be physically active and works in his yard on a routine basis.[name]  He continues to be at his baseline level of health with excellent dietary habits and frequent visits to the gym.  He has had no major changes since we last saw him.  Treatment of smoldering multiple myeloma is a developing area of hematology.  There have been clinical trials showing benefit in the treatment of intermediate to high risk SMM (J Clin Oncol. 2020 Apr 10;38(11):1126-1137).  The current treatment recommendation consists of lenalidomide 25 mg daily for 21 of 28 days.  This therapy is however best suited for younger patients with longer life expectancy is given the long time to progression the average smoldering multiple myeloma patient has.  Review of the literature shows that intermediate risk patients have a time  to progression of approximately 5 years (Blood Cancer Journal 8, 59 (2018).  Given [name] advanced age I would recommend close monitoring of his hematological parameters and creatinine/calcium to assure that his disease is not progressing.  I have shared with [name] these statistics and recommendations.   Previously after detailed discussion [name] was in agreement with continued monitoring of his smoldering multiple myeloma.  He acknowledged since he feels well, therefore chemotherapy would likely inhibit his physical activity and his overall wellbeing.  I am in strong agreement that holding on treatment at this time is a reasonable option.  #Smoldering Multiple Myeloma, Intermediate Risk --confirmed diagnosis based on the bone marrow biopsy, with plasma cells of 10-15% and no CRAB criteria. No evidence of amyloidosis on bone marrow stain.  --treatment of Smoldering multiple myeloma is a newer idea and predominately consists of monotherapy lenalidomide. Using the [facility] 2018 20/20/20 guidelines, [name] is a borderline Intermediate Risk SMM.  --given his advance aged and lack of any CRAB criteria, I would recommend holding on treatment at this time with close  clinical monitoring for progression. [name] was in agreement with close continued monitoring.  --RTC in 6 months time.   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  --last PSA collected at [facility], found to be PSA 0.2.   All questions were answered. The patient knows to call the clinic with any problems, questions or concerns.  A total of more than 20 minutes were spent face-to-face with the patient during this encounter and over half of that time was spent on  counseling and coordination of care as outlined above.   Ledell Peoples, MD Department of Hematology/Oncology [place] [facility] Phone: [phone] Pager: [phone] Email: [email or web address]  05/04/2020 6:34 PM  Literature Support:  Rene Paci, Ron Parker AM, Buadi FK, Wylie Hail, Matous JV, Anderson DM, Emmons RV, Mahindra A, Wagner LI, Dhodapkar MV, Rajkumar SV. Randomized Trial of Lenalidomide Versus Observation in Smoldering Multiple Myeloma. J Clin Oncol. 2020 Apr 10;38(11):1126-1137. doi: 10.1200/JCO.19.01740. Epub 2019 Oct 25. PMID: 46803212; PMCID: [id number]. --Progression-free survival was significantly longer with lenalidomide compared with observation (hazard ratio, 0.28; 95% CI, 0.12 to 0.62; P = .002). One-, 2-, and 3-year progression-free survival was 98%, 93%, and 91% for the lenalidomide arm versus 89%, 76%, and 66% for the observation arm, respectively.  Lakshman, A., Rajkumar, S.V., Buadi, F.K. et al. Risk stratification of smoldering multiple myeloma incorporating revised IMWG diagnostic criteria. Blood Cancer Journal 8, 59 (2018). [email or web address] --The median TTP for low-, intermediate-, and high-risk groups were 110, 68, and 29 months, respectively (p?<?0.0001). BMPC%?>?20%, M-protein?>?2?g/dL, and FLCr?>?20 at diagnosis can be used to risk stratify patients with SMM. Patients with high-risk SMM need close follow-up and are candidates for clinical trials aiming to prevent progression.

## 2020-05-21 ENCOUNTER — Telehealth: Payer: Self-pay | Admitting: Pharmacist

## 2020-05-21 NOTE — Progress Notes (Addendum)
Chronic Care Management Pharmacy Assistant   Name: [name]  MRN: [id number] DOB: 11-12-1931  Reason for Encounter: DM Disease State  Patient Questions:  1.  Have you seen any other providers since your last visit? Yes  2.  Any changes in your medicines or health? Yes    PCP : [name], DO   Their chronic conditions include: Diabetes, HTN, Hyperlipidemia/CAD, BPH, CKD.  Office Visit: None since their last CCM visit with the clinical pharmacist on 04-15-2020.  Consults: 04-28-2020 ([facility]) Patient presented in the office for f/u visit with a dx of smoldering multiple myeloma and prostate cancer. There was a change with Amlodipine medication from 10 mg daily to 5 mg twice a day.   Allergies:  No Known Allergies  Medications: Outpatient Encounter Medications as of 05/21/2020  Medication Sig   Alcohol Swabs PADS Use as directed once a day   amLODipine (NORVASC) 5 MG tablet 10 mg daily.   aspirin 81 MG tablet Take 81 mg by mouth daily.   atorvastatin (LIPITOR) 20 MG tablet Take 1 tablet (20 mg total) by mouth daily.   Blood Glucose Calibration (TRUE METRIX LEVEL 1) Low SOLN USE AS DIRECTED   Blood Glucose Calibration (TRUE METRIX LEVEL 3) High SOLN Use to check controls on glucometer strips every 30 days or with each new bottle of strips (whichever comes first).   Blood Glucose Monitoring Suppl (TRUE METRIX METER) w/Device KIT USE AS DIRECTED ONCE A DAY   brimonidine-timolol (COMBIGAN) 0.2-0.5 % ophthalmic solution Place 1 drop into the right eye 2 (two) times daily.    fish oil-omega-3 fatty acids 1000 MG capsule Take 2 g by mouth daily.     glimepiride (AMARYL) 2 MG tablet Take 1 tablet (2 mg total) by mouth daily before breakfast.   glucose blood (TRUE METRIX BLOOD GLUCOSE TEST) test strip USE AS DIRECTED ONCE A DAY   losartan-hydrochlorothiazide (HYZAAR) 100-25 MG tablet Take 1 tablet by mouth daily.   Multiple Vitamin (MULTIVITAMIN) tablet Take 1 tablet by  mouth daily.   SitaGLIPtin-MetFORMIN HCl (JANUMET XR) 50-1000 MG TB24 Take 2 tablets by mouth daily.   tamsulosin (FLOMAX) 0.4 MG CAPS capsule TAKE 1 CAPSULE (0.4 MG TOTAL) BY MOUTH DAILY.   TRUEplus Lancets 30G MISC USE AS DIRECTED ONCE A DAY   TRUEplus Lancets 33G MISC Use to check blood sugar once a day.  DX  E11.9   No facility-administered encounter medications on file as of 05/21/2020.    Current Diagnosis: Patient Active Problem List   Diagnosis Date Noted   Diabetes mellitus ([place]) 04/15/2020   Type 2 diabetes mellitus with stage 3b chronic kidney disease, without long-term current use of insulin ([place]) 02/06/2020   Type 2 diabetes mellitus with diabetic polyneuropathy, without long-term current use of insulin ([place]) 02/06/2020   Uncontrolled type 2 diabetes mellitus with hyperglycemia ([place]) 04/06/2019   Hyperlipidemia associated with type 2 diabetes mellitus ([place]) 04/06/2019   Monoclonal gammopathy 04/06/2019   Osteoporosis with pathological fracture of forearm 03/21/2019   Multiple closed fractures of ribs of left side 03/21/2019   Preventative health care 04/01/2017   Prostate cancer ([place]) 02/15/2017   BPH with urinary obstruction 12/13/2012   CAD (coronary artery disease) 04/09/2011   GASTRITIS 05/25/2010   COLONIC POLYPS, ADENOMATOUS, HX OF 04/20/2010   ANEMIA 10/27/2009   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, without long-term current use of insulin ([place]) 07/04/2006   Hyperlipidemia 07/04/2006   Essential hypertension  07/04/2006    Goals Addressed   None    Recent Relevant Labs: Lab Results  Component Value Date/Time   HGBA1C 6.6 (A) 04/15/2020 08:17 AM   HGBA1C 9.6 (H) 01/15/2020 09:58 AM   HGBA1C 8.8 (H) 10/15/2019 08:30 AM   MICROALBUR 57.1 (H) 01/15/2020 09:58 AM   MICROALBUR 50.7 (H) 04/06/2019 09:34 AM    Kidney Function Lab Results  Component Value Date/Time   CREATININE 1.52 (H) 04/28/2020 08:48 AM   CREATININE 1.50 01/15/2020  09:58 AM   CREATININE 1.59 (H) 10/26/2019 08:54 AM   GFR 44.38 (L) 01/15/2020 09:58 AM   GFRNONAA 44 (L) 04/28/2020 08:48 AM   GFRAA 45 (L) 10/26/2019 08:54 AM    Current antihyperglycemic regimen:  Glimepiride 2 mg daily Janumet 50-1000 mg twice daily  What recent interventions/DTPs have been made to improve glycemic control:  none  Have there been any recent hospitalizations or ED visits since last visit with CPP? No   Patient denies hypoglycemic symptoms.    Patient denies hyperglycemic symptoms  How often are you checking your blood sugar? once daily   What are your blood sugars ranging?  Fasting: 80's-90's  During the week, how often does your blood glucose drop below 70? Never   Are you checking your feet daily/regularly? Yes, there are no issues. Patient reports taking very good care of his feet.  Adherence Review: Is the patient currently on a STATIN medication? Yes atorvastatin 20 MG tablet Is the patient currently on ACE/ARB medication? Yes losartan-hydrochlorothiazide 100-25 MG tablet Does the patient have >5 day gap between last estimated fill dates? Yes   Follow-Up:  Pharmacist Review   Fanny Skates, [place] Pharmacist Assistant [phone]  Noted >5 day gap history per dispense report. Need to assure adherence. -KDay Fill Dates Per Dispense Report  losartan-hydrochlorothiazide 100-25 MG tablet: Filled 01/25/20 - 90 DS   Need to confirm last fill date.   5 minutes spent in review, coordination, and documentation.   Reviewed by: De Blanch, PharmD, BCACP Clinical Pharmacist [facility] [phone]

## 2020-05-30 ENCOUNTER — Other Ambulatory Visit: Payer: Self-pay | Admitting: Internal Medicine

## 2020-06-20 ENCOUNTER — Encounter: Payer: Self-pay | Admitting: Gastroenterology

## 2020-07-24 ENCOUNTER — Encounter (HOSPITAL_BASED_OUTPATIENT_CLINIC_OR_DEPARTMENT_OTHER): Payer: Self-pay

## 2020-07-24 ENCOUNTER — Other Ambulatory Visit: Payer: Self-pay

## 2020-07-24 ENCOUNTER — Emergency Department (HOSPITAL_BASED_OUTPATIENT_CLINIC_OR_DEPARTMENT_OTHER): Payer: Medicare HMO

## 2020-07-24 ENCOUNTER — Emergency Department (HOSPITAL_BASED_OUTPATIENT_CLINIC_OR_DEPARTMENT_OTHER)
Admission: EM | Admit: 2020-07-24 | Discharge: 2020-07-24 | Disposition: A | Discharge status: Home / Self Care | Payer: Medicare HMO | Attending: Emergency Medicine | Admitting: Emergency Medicine

## 2020-07-24 DIAGNOSIS — R739 Hyperglycemia, unspecified: Secondary | ICD-10-CM | POA: Diagnosis present

## 2020-07-24 DIAGNOSIS — Z7982 Long term (current) use of aspirin: Secondary | ICD-10-CM | POA: Diagnosis not present

## 2020-07-24 DIAGNOSIS — E1122 Type 2 diabetes mellitus with diabetic chronic kidney disease: Secondary | ICD-10-CM | POA: Insufficient documentation

## 2020-07-24 DIAGNOSIS — E1142 Type 2 diabetes mellitus with diabetic polyneuropathy: Secondary | ICD-10-CM | POA: Insufficient documentation

## 2020-07-24 DIAGNOSIS — Z8546 Personal history of malignant neoplasm of prostate: Secondary | ICD-10-CM | POA: Diagnosis not present

## 2020-07-24 DIAGNOSIS — E1165 Type 2 diabetes mellitus with hyperglycemia: Secondary | ICD-10-CM | POA: Diagnosis not present

## 2020-07-24 DIAGNOSIS — R030 Elevated blood-pressure reading, without diagnosis of hypertension: Secondary | ICD-10-CM | POA: Diagnosis not present

## 2020-07-24 DIAGNOSIS — Z7984 Long term (current) use of oral hypoglycemic drugs: Secondary | ICD-10-CM | POA: Diagnosis not present

## 2020-07-24 DIAGNOSIS — I509 Heart failure, unspecified: Secondary | ICD-10-CM | POA: Diagnosis not present

## 2020-07-24 DIAGNOSIS — N1832 Chronic kidney disease, stage 3b: Secondary | ICD-10-CM | POA: Insufficient documentation

## 2020-07-24 DIAGNOSIS — I160 Hypertensive urgency: Secondary | ICD-10-CM

## 2020-07-24 DIAGNOSIS — I2510 Atherosclerotic heart disease of native coronary artery without angina pectoris: Secondary | ICD-10-CM | POA: Diagnosis not present

## 2020-07-24 DIAGNOSIS — Z87891 Personal history of nicotine dependence: Secondary | ICD-10-CM | POA: Insufficient documentation

## 2020-07-24 DIAGNOSIS — Z955 Presence of coronary angioplasty implant and graft: Secondary | ICD-10-CM | POA: Diagnosis not present

## 2020-07-24 DIAGNOSIS — Z79899 Other long term (current) drug therapy: Secondary | ICD-10-CM | POA: Diagnosis not present

## 2020-07-24 DIAGNOSIS — R9431 Abnormal electrocardiogram [ECG] [EKG]: Secondary | ICD-10-CM | POA: Diagnosis not present

## 2020-07-24 DIAGNOSIS — I130 Hypertensive heart and chronic kidney disease with heart failure and stage 1 through stage 4 chronic kidney disease, or unspecified chronic kidney disease: Secondary | ICD-10-CM | POA: Insufficient documentation

## 2020-07-24 DIAGNOSIS — I700 Atherosclerosis of aorta: Secondary | ICD-10-CM | POA: Diagnosis not present

## 2020-07-24 LAB — BASIC METABOLIC PANEL
Anion gap: 9 (ref 5–15)
BUN: 15 mg/dL (ref 8–23)
CO2: 29 mmol/L (ref 22–32)
Calcium: 9.2 mg/dL (ref 8.9–10.3)
Chloride: 102 mmol/L (ref 98–111)
Creatinine, Ser: 1.11 mg/dL (ref 0.61–1.24)
GFR, Estimated: >60 mL/min (ref >60)
Glucose, Bld: 179 mg/dL — ABNORMAL HIGH (ref 70–99)
Potassium: 3.9 mmol/L (ref 3.5–5.1)
Sodium: 140 mmol/L (ref 135–145)

## 2020-07-24 LAB — URINALYSIS, ROUTINE W REFLEX MICROSCOPIC
Bilirubin Urine: NEGATIVE
Glucose, UA: NEGATIVE mg/dL
Hgb urine dipstick: NEGATIVE
Ketones, ur: NEGATIVE mg/dL
Leukocytes,Ua: NEGATIVE
Nitrite: NEGATIVE
Protein, ur: 100 mg/dL — AB
Specific Gravity, Urine: 1.01 (ref 1.005–1.030)
pH: 8 (ref 5.0–8.0)

## 2020-07-24 LAB — CBC WITH DIFFERENTIAL/PLATELET
Abs Immature Granulocytes: 0.05 10*3/uL (ref 0.00–0.07)
Basophils Absolute: 0.1 10*3/uL (ref 0.0–0.1)
Basophils Relative: 1 %
Eosinophils Absolute: 0.1 10*3/uL (ref 0.0–0.5)
Eosinophils Relative: 1 %
HCT: 40.6 % (ref 39.0–52.0)
Hemoglobin: 13.5 g/dL (ref 13.0–17.0)
Immature Granulocytes: 1 %
Lymphocytes Relative: 25 %
Lymphs Abs: 2.5 10*3/uL (ref 0.7–4.0)
MCH: 30.3 pg (ref 26.0–34.0)
MCHC: 33.3 g/dL (ref 30.0–36.0)
MCV: 91 fL (ref 80.0–100.0)
Monocytes Absolute: 0.8 10*3/uL (ref 0.1–1.0)
Monocytes Relative: 8 %
Neutro Abs: 6.4 10*3/uL (ref 1.7–7.7)
Neutrophils Relative %: 64 %
Platelets: 248 10*3/uL (ref 150–400)
RBC: 4.46 MIL/uL (ref 4.22–5.81)
RDW: 13.4 % (ref 11.5–15.5)
WBC: 10 10*3/uL (ref 4.0–10.5)
nRBC: 0 % (ref 0.0–0.2)

## 2020-07-24 LAB — URINALYSIS, MICROSCOPIC (REFLEX)

## 2020-07-24 LAB — CBG MONITORING, ED: Glucose-Capillary: 163 mg/dL — ABNORMAL HIGH (ref 70–99)

## 2020-07-24 MED ORDER — AMLODIPINE BESYLATE 5 MG PO TABS
10.0000 mg | ORAL_TABLET | Freq: Once | ORAL | Status: AC
  Administered 2020-07-24: 10 mg via ORAL
  Filled 2020-07-24: qty 2

## 2020-07-24 MED ORDER — HYDRALAZINE HCL 20 MG/ML IJ SOLN
10.0000 mg | Freq: Once | INTRAMUSCULAR | Status: AC
  Administered 2020-07-24: 10 mg via INTRAVENOUS
  Filled 2020-07-24: qty 1

## 2020-07-24 MED ORDER — HYDRALAZINE HCL 10 MG PO TABS: 10.0000 mg | ORAL_TABLET | Freq: Three times a day (TID) | ORAL | 0 refills | Status: DC | PRN

## 2020-07-24 MED ORDER — HYDRALAZINE HCL 25 MG PO TABS
25.0000 mg | ORAL_TABLET | Freq: Once | ORAL | Status: AC
  Administered 2020-07-24: 25 mg via ORAL

## 2020-07-24 MED ORDER — HYDRALAZINE HCL 25 MG PO TABS
ORAL_TABLET | ORAL | Status: AC
  Filled 2020-07-24: qty 1

## 2020-07-24 NOTE — Discharge Instructions (Signed)
You were seen in the ER for elevated blood pressure.  Fortunately, all the lab results are reassuring.  We are sending you home with 10 mg of hydralazine tablet prescription.  You can take it only if your systolic blood pressure is over 180.  You have an appointment with your primary care doctor next week.  We request that you keep a log of your blood pressure for the rest of the week, and take it with you to the doctor for appropriate BP management.  Return to the ER if you start having chest pain, headaches, confusion, strokelike symptoms.

## 2020-07-24 NOTE — ED Notes (Signed)
Pt has medications with him, did not take his BP medication today.

## 2020-07-24 NOTE — ED Provider Notes (Addendum)
[name] EMERGENCY DEPARTMENT Provider Note   CSN: [id number] Arrival date & time: 07/24/20  1010     History Chief Complaint  Patient presents with  . Hyperglycemia  . Hypertension    [name] is a 87 y.o. male.  HPI     87 year old male with history of CAD, diabetes, hypertension, hyperlipidemia, CHF comes in with chief complaint of elevated blood sugar and elevated blood pressure.  Patient states that he checks his blood pressure regularly.  Since the last 3 days, his blood pressure has been abnormally high.  Normally the blood pressure is in the 120s.  He has no associated headache, chest pain, shortness of breath, new neurologic symptoms such as vision change, slurred speech, dizziness, numbness or weakness.  Patient also states his blood sugar has run slightly high.  He has been taking all of his medications as prescribed.  Past Medical History:  Diagnosis Date  . CAD (coronary artery disease)    LHC 03/25/11 by Dr. Burt Knack:  pLAD 99%, oCFX 20-30%, pOM1 40%, dAVCFX 20-30%.  EF was normal on nuclear study.  He was treated with a Promus DES to his pLAD.   [name] Colon polyps 1996   villous adenoma  . DM type 2 (diabetes mellitus, type 2) ([place]) 2002  . Fuchs' corneal dystrophy   . Hyperlipidemia   . Hypertension   . Macular degeneration    posterior vitreous vitreous detachment  . Prostate cancer [facility]) dx 2018    Patient Active Problem List   Diagnosis Date Noted  . Diabetes mellitus ([place]) 04/15/2020  . Type 2 diabetes mellitus with stage 3b chronic kidney disease, without long-term current use of insulin ([place]) 02/06/2020  . Type 2 diabetes mellitus with diabetic polyneuropathy, without long-term current use of insulin ([place]) 02/06/2020  . Uncontrolled type 2 diabetes mellitus with hyperglycemia ([place]) 04/06/2019  . Hyperlipidemia associated with type 2 diabetes mellitus ([place]) 04/06/2019  . Monoclonal gammopathy 04/06/2019  . Osteoporosis with pathological  fracture of forearm 03/21/2019  . Multiple closed fractures of ribs of left side 03/21/2019  . Preventative health care 04/01/2017  . Prostate cancer ([place]) 02/15/2017  . BPH with urinary obstruction 12/13/2012  . CAD (coronary artery disease) 04/09/2011  . GASTRITIS 05/25/2010  . COLONIC POLYPS, ADENOMATOUS, HX OF 04/20/2010  . ANEMIA 10/27/2009  . 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, without long-term current use of insulin ([place]) 07/04/2006  . Hyperlipidemia 07/04/2006  . Essential hypertension 07/04/2006    Past Surgical History:  Procedure Laterality Date  . CARDIAC CATHETERIZATION  03/25/2011  . cataract Bilateral 2010  . corenea implants  june and sept 2018   [name]  . CORONARY STENT PLACEMENT  03/25/2011  . RADIOACTIVE SEED IMPLANT N/A 05/06/2017   Procedure: RADIOACTIVE SEED IMPLANT/BRACHYTHERAPY IMPLANT;  Surgeon: Kathie Rhodes, MD;  Location: [facility];  Service: Urology;  Laterality: N/A;  . SPACE OAR INSTILLATION N/A 05/06/2017   Procedure: SPACE OAR INSTILLATION;  Surgeon: Kathie Rhodes, MD;  Location[facility];  Service: Urology;  Laterality: N/A;       Family History  Problem Relation Age of Onset  . Coronary artery disease Father 40       deceased  . Stomach cancer Mother 68    Social History   Tobacco Use  . Smoking status: Former Smoker    Packs/day: 0.25    Years: 3.00    Pack years: 0.75    Quit date: 06/07/1958  Years since quitting: 62.1  . Smokeless tobacco: Never Used  Vaping Use  . Vaping Use: Never used  Substance Use Topics  . Alcohol use: Yes    Comment: 1 glass wine per day  . Drug use: No    Home Medications Prior to Admission medications   Medication Sig Start Date End Date Taking? Authorizing Provider  Alcohol Swabs PADS Use as directed once a day 01/24/20   Carollee Herter, Alferd Apa, DO  amLODipine (NORVASC) 5 MG tablet 10 mg daily. 05/04/16   [provider]  aspirin 81 MG tablet Take 81 mg by mouth daily.    [provider]  atorvastatin (LIPITOR) 20 MG tablet Take 1 tablet (20 mg total) by mouth daily. 01/24/20   [name], DO  Blood Glucose Calibration (TRUE METRIX LEVEL 1) Low SOLN USE AS DIRECTED 03/19/20   [name], Melanie Crazier, MD  Blood Glucose Calibration (TRUE METRIX LEVEL 3) High SOLN Use to check controls on glucometer strips every 30 days or with each new bottle of strips (whichever comes first). 01/24/20   Roma Schanz R, DO  Blood Glucose Monitoring Suppl (TRUE METRIX METER) w/Device KIT USE AS DIRECTED ONCE A DAY 06/01/20   [name], Melanie Crazier, MD  brimonidine-timolol (COMBIGAN) 0.2-0.5 % ophthalmic solution Place 1 drop into the right eye 2 (two) times daily.     [provider]  fish oil-omega-3 fatty acids 1000 MG capsule Take 2 g by mouth daily.      [provider]  glimepiride (AMARYL) 2 MG tablet Take 1 tablet (2 mg total) by mouth daily before breakfast. 04/15/20   [name], Melanie Crazier, MD  glucose blood (TRUE METRIX BLOOD GLUCOSE TEST) test strip USE AS DIRECTED ONCE A DAY 03/19/20   [name], Melanie Crazier, MD  losartan-hydrochlorothiazide (HYZAAR) 100-25 MG tablet Take 1 tablet by mouth daily. 01/24/20   [name], DO  Multiple Vitamin (MULTIVITAMIN) tablet Take 1 tablet by mouth daily.    [provider]  SitaGLIPtin-MetFORMIN HCl (JANUMET XR) 50-1000 MG TB24 Take 2 tablets by mouth daily. 01/24/20   [name], DO  tamsulosin (FLOMAX) 0.4 MG CAPS capsule TAKE 1 CAPSULE (0.4 MG TOTAL) BY MOUTH DAILY. 01/24/20   Roma Schanz R, DO  TRUEplus Lancets 30G MISC USE AS DIRECTED ONCE A DAY 03/19/20   [name], Melanie Crazier, MD  TRUEplus Lancets 33G MISC Use to check blood sugar once a day.  DX  E11.9 01/24/20   [name], DO    Allergies    Patient has no known allergies.  Review of Systems    Review of Systems  Constitutional: Positive for activity change.  Respiratory: Negative for shortness of breath.   Cardiovascular: Negative for chest pain.  Gastrointestinal: Negative for nausea and vomiting.  Genitourinary: Negative for dysuria.  Musculoskeletal: Negative for back pain.  Neurological: Negative for dizziness, syncope, speech difficulty, weakness, light-headedness and headaches.  All other systems reviewed and are negative.   Physical Exam Updated Vital Signs BP (!) 217/106   Pulse 72   Temp 98.1 F (36.7 C) (Oral)   Resp 18   Ht 5' 4"  (1.626 m)   Wt 55.8 kg   SpO2 97%   BMI 21.11 kg/m   Physical Exam Vitals and nursing note reviewed.  Constitutional:      Appearance: He is well-developed.  HENT:     Head: Atraumatic.  Cardiovascular:     Rate and  Rhythm: Normal rate.  Pulmonary:     Effort: Pulmonary effort is normal.  Musculoskeletal:     Cervical back: Neck supple.  Skin:    General: Skin is warm.  Neurological:     Mental Status: He is alert and oriented to person, place, and time.     ED Results / Procedures / Treatments   Labs (all labs ordered are listed, but only abnormal results are displayed) Labs Reviewed  BASIC METABOLIC PANEL  CBC WITH DIFFERENTIAL/PLATELET  URINALYSIS, ROUTINE W REFLEX MICROSCOPIC  CBG MONITORING, ED    EKG EKG Interpretation  Date/Time:  Thursday July 24 2020 10:46:36 EST Ventricular Rate:  71 PR Interval:    QRS Duration: 87 QT Interval:  392 QTC Calculation: 426 R Axis:   53 Text Interpretation: Sinus rhythm Multiple ventricular premature complexes Abnormal R-wave progression, early transition No acute changes No significant change since last tracing Confirmed by Varney Biles ([id number]) on 07/24/2020 11:16:58 AM   Radiology DG Chest Port 1 View  Result Date: 07/24/2020 CLINICAL DATA:  Elevated blood pressure. EXAM: PORTABLE CHEST 1 VIEW COMPARISON:  CT scan 03/20/2019 FINDINGS: The cardiac  silhouette, mediastinal and hilar contours are within normal limits. Mild tortuosity and calcification of the thoracic aorta. The lungs are clear of an acute process. No pulmonary lesions or pleural effusions. No pneumothorax. The bony thorax is intact. IMPRESSION: No acute cardiopulmonary findings. Electronically Signed   By: Marijo Sanes M.D.   On: 07/24/2020 11:37    Procedures Procedures   Medications Ordered in ED Medications  amLODipine (NORVASC) tablet 10 mg (10 mg Oral Given 07/24/20 1150)  hydrALAZINE (APRESOLINE) injection 10 mg (10 mg Intravenous Given 07/24/20 1151)    ED Course  I have reviewed the triage vital signs and the nursing notes.  Pertinent labs & imaging results that were available during my care of the patient were reviewed by me and considered in my medical decision making (see chart for details).  Clinical Course as of 07/24/20 1514  Thu Jul 24, 2020  1513 BP(!): 167/63 I called patient's PCP. Patient already has an appointment next week. We will discharge him with as needed hydralazine 10 mg only, that he can take only if the blood pressure is over 180. This plan discussed with the patient and his son. They are in agreement to keep a BP log of that they can take to the PCP next week. [AN]    Clinical Course User Index [AN] Varney Biles, MD   MDM Rules/Calculators/A&P                          87 year old male comes in with chief complaint of elevated blood pressure and elevated blood sugar.  History is not suggestive of any source of infection, medication noncompliance.  We will check basic labs and a UA for the elevated blood sugar.  Rule out DKA.  Blood pressure is 208/77 during my assessment.  Vascular exam is normal.  He denies any endorgan dysfunction symptoms like chest pain, numbness, headaches, back pain or shortness of.  Unclear why he is having rising blood pressure.  I set of labs and chest x-ray, I do not think he needs any other advanced  work-up for organ dysfunction including for dissection or brain bleed.  Tropes are not indicated given his chest pain-free.  We will give his morning BP meds and IV hydralazine with it.  Final Clinical Impression(s) / ED Diagnoses Final diagnoses:  Hyperglycemia    Rx / DC Orders ED Discharge Orders    None       Varney Biles, MD 07/24/20 1156

## 2020-07-24 NOTE — ED Triage Notes (Signed)
Pt arrives with c/o high blood sugar at home last reading of 183, also reports his BP has been elevated as well at 204/90.

## 2020-07-25 NOTE — Progress Notes (Deleted)
Chronic Care Management Pharmacy Note  07/25/2020 Name:  [name] MRN:  [id number] DOB:  04-Jun-1932  Subjective: [name] is an 87 y.o. year old male who is a primary patient of [name], DO.  The CCM team was consulted for assistance with disease management and care coordination needs.    Engaged with patient by telephone for follow up visit in response to provider referral for pharmacy case management and/or care coordination services.   Consent to Services:  The patient was given the following information about Chronic Care Management services today, agreed to services, and gave verbal consent: 1. CCM service includes personalized support from designated clinical staff supervised by the primary care provider, including individualized plan of care and coordination with other care providers 2. 24/7 contact phone numbers for assistance for urgent and routine care needs. 3. Service will only be billed when office clinical staff spend 20 minutes or more in a month to coordinate care. 4. Only one practitioner may furnish and bill the service in a calendar month. 5.The patient may stop CCM services at any time (effective at the end of the month) by phone call to the office staff. 6. The patient will be responsible for cost sharing (co-pay) of up to 20% of the service fee (after annual deductible is met). Patient agreed to services and consent obtained.  Patient Care Team: Carollee Herter, Alferd Apa, DO as PCP - General (Family Medicine) Kathie Rhodes, MD (Inactive) as Consulting Physician (Urology) Tyler Pita, MD as Consulting Physician (Radiation Oncology) Marilynne Halsted, MD as Referring Physician (Ophthalmology) Day, Melvenia Beam, [facility] (Inactive) as Pharmacist (Pharmacist) Orson Slick, MD as Consulting Physician (Hematology and Oncology)  Recent office visits: 01/15/20: Visit w/ Dr. Etter Sjogren - Referral to endo  Recent consult visits: 04/15/20: Endo visit w/ Dr. Kelton Pillar  - A1c to goal! A1c 6.6% with start of Janumet. Did have hypoglycemic episodes so glimepiride reduced to 39m daily. Continue Janumet.   02/06/20: Endo visit w/ Dr. SKelton Pillar- referral to RD. Noted improvement on Janument started about 10 days ago. No med changes made.   02/04/20: Ophthalmology visit w/ Dr. BHulda Marinvisits: 07/24/2020 - hypertensive urgency 208/77 during assessment.  Hydralazine 173mtid added  Objective:  Lab Results  Component Value Date   CREATININE 1.11 07/24/2020   BUN 15 07/24/2020   GFR 44.38 (L) 01/15/2020   GFRNONAA >60 07/24/2020   GFRAA 45 (L) 10/26/2019   NA 140 07/24/2020   K 3.9 07/24/2020   CALCIUM 9.2 07/24/2020   CO2 29 07/24/2020    Lab Results  Component Value Date/Time   HGBA1C 6.6 (A) 04/15/2020 08:17 AM   HGBA1C 9.6 (H) 01/15/2020 09:58 AM   HGBA1C 8.8 (H) 10/15/2019 08:30 AM   GFR 44.38 (L) 01/15/2020 09:58 AM   GFR 52.85 (L) 10/15/2019 08:30 AM   MICROALBUR 57.1 (H) 01/15/2020 09:58 AM   MICROALBUR 50.7 (H) 04/06/2019 09:34 AM    Last diabetic Eye exam:  Lab Results  Component Value Date/Time   HMDIABEYEEXA No Retinopathy 08/28/2015 12:00 AM    Last diabetic Foot exam:  Lab Results  Component Value Date/Time   HMDIABFOOTEX done 10/23/2013 12:00 AM     Lab Results  Component Value Date   CHOL 150 01/15/2020   HDL 39.60 01/15/2020   LDLCALC 87 01/15/2020   TRIG 117.0 01/15/2020   CHOLHDL 4 01/15/2020    Hepatic Function Latest Ref Rng & Units 04/28/2020 01/15/2020 10/26/2019  Total  Protein 6.5 - 8.1 g/dL 7.0 7.0 7.2  Albumin 3.5 - 5.0 g/dL 4.0 4.1 4.0  AST 15 - 41 U/L 12(L) 13 14(L)  ALT 0 - 44 U/L _0 Alk Phosphatase 38 - 126 U/L 85 76 101  Total Bilirubin 0.3 - 1.2 mg/dL 0.7 0.5 0.8  Bilirubin, Direct 0.0 - 0.3 mg/dL - - -    Lab Results  Component Value Date/Time   TSH 2.47 10/20/2009 08:42 AM    CBC Latest Ref Rng & Units 07/24/2020 04/28/2020 10/26/2019  WBC 4.0 - 10.5 K/uL 10.0 10.8(H) 9.2   Hemoglobin 13.0 - 17.0 g/dL 13.5 12.2(L) 14.6  Hematocrit 39.0 - 52.0 % 40.6 37.0(L) 43.6  Platelets 150 - 400 K/uL 248 243 234    No results found for: VD25OH  Clinical ASCVD: {YES/NO:21197} The ASCVD Risk score Mikey Bussing DC Jr., et al., 2013) failed to calculate for the following reasons:   The 2013 ASCVD risk score is only valid for ages 57 to 25    Depression screen PHQ 2/9 03/24/2020 09/10/2019 04/04/2018  Decreased Interest 0 0 0  Down, Depressed, Hopeless 0 0 0  PHQ - 2 Score 0 0 0     ***Other: (CHADS2VASc if Afib, MMRC or CAT for COPD, ACT, DEXA)  Social History   Tobacco Use  Smoking Status Former Smoker  . Packs/day: 0.25  . Years: 3.00  . Pack years: 0.75  . Quit date: 06/07/1958  . Years since quitting: 62.1  Smokeless Tobacco Never Used   BP Readings from Last 3 Encounters:  07/24/20 (!) 167/63  04/28/20 120/61  04/15/20 126/72   Pulse Readings from Last 3 Encounters:  07/24/20 74  04/28/20 76  04/15/20 69   Wt Readings from Last 3 Encounters:  07/24/20 123 lb (55.8 kg)  04/28/20 128 lb 3.2 oz (58.2 kg)  04/15/20 130 lb (59 kg)    Assessment/Interventions: Review of patient past medical history, allergies, medications, health status, including review of consultants reports, laboratory and other test data, was performed as part of comprehensive evaluation and provision of chronic care management services.   SDOH:  (Social Determinants of Health) assessments and interventions performed: {yes/no:20286}   CCM Care Plan  No Known Allergies  Medications Reviewed Today    Reviewed by Orson Slick, MD (Physician) on 04/28/20 at 1000  Med List Status: <None>  Medication Order Taking? Sig Documenting Provider Last Dose Status Informant  Alcohol Swabs PADS [id number]  Use as directed once a day [name], DO  Active   amLODipine (NORVASC) 5 MG tablet [id number] Yes 10 mg daily. [provider]  Active   aspirin 81 MG tablet [id number]   Take 81 mg by mouth daily. [provider]  Active Self  atorvastatin (LIPITOR) 20 MG tablet [id number]  Take 1 tablet (20 mg total) by mouth daily. Carollee Herter, Kendrick Fries R, DO  Active   Blood Glucose Calibration (TRUE METRIX LEVEL 1) Low SOLN [id number]  USE AS DIRECTED [name], Melanie Crazier, MD  Active   Blood Glucose Calibration (TRUE METRIX LEVEL 3) High SOLN [id number]  Use to check controls on glucometer strips every 30 days or with each new bottle of strips (whichever comes first). Carollee Herter, Kendrick Fries R, DO  Active   Blood Glucose Monitoring Suppl (TRUE METRIX METER) w/Device KIT [id number]  USE AS DIRECTED ONCE A DAY [name], Melanie Crazier, MD  Active   brimonidine-timolol (COMBIGAN) 0.2-0.5 % ophthalmic solution [id number]  Place 1 drop  into the right eye 2 (two) times daily.  [provider]  Active Self  fish oil-omega-3 fatty acids 1000 MG capsule [id number]  Take 2 g by mouth daily.   [provider]  Active Self  glimepiride (AMARYL) 2 MG tablet [id number]  Take 1 tablet (2 mg total) by mouth daily before breakfast. [name], Melanie Crazier, MD  Active   glucose blood (TRUE METRIX BLOOD GLUCOSE TEST) test strip [id number]  USE AS DIRECTED ONCE A DAY [name], Melanie Crazier, MD  Active   influenza vaccine adjuvanted (FLUAD) injection 0.5 mL [id number]   Orson Slick, MD  Active   losartan-hydrochlorothiazide (HYZAAR) 100-25 MG tablet [id number]  Take 1 tablet by mouth daily. [name], DO  Active   Multiple Vitamin (MULTIVITAMIN) tablet [id number]  Take 1 tablet by mouth daily. [provider]  Active Self  SitaGLIPtin-MetFORMIN HCl (JANUMET XR) 50-1000 MG TB24 [id number]  Take 2 tablets by mouth daily. Roma Schanz R, DO  Active   tamsulosin (FLOMAX) 0.4 MG CAPS capsule [id number]  TAKE 1 CAPSULE (0.4 MG TOTAL) BY MOUTH DAILY. [name], DO  Active   TRUEplus Lancets 30G [place] [id number]  USE AS DIRECTED ONCE A DAY  [name], Melanie Crazier, MD  Active   TRUEplus Lancets 33G MISC [id number]  Use to check blood sugar once a day.  DX  E11.9 [name], DO  Active           Patient Active Problem List   Diagnosis Date Noted  . Diabetes mellitus ([place]) 04/15/2020  . Type 2 diabetes mellitus with stage 3b chronic kidney disease, without long-term current use of insulin ([place]) 02/06/2020  . Type 2 diabetes mellitus with diabetic polyneuropathy, without long-term current use of insulin ([place]) 02/06/2020  . Uncontrolled type 2 diabetes mellitus with hyperglycemia ([place]) 04/06/2019  . Hyperlipidemia associated with type 2 diabetes mellitus ([place]) 04/06/2019  . Monoclonal gammopathy 04/06/2019  . Osteoporosis with pathological fracture of forearm 03/21/2019  . Multiple closed fractures of ribs of left side 03/21/2019  . Preventative health care 04/01/2017  . Prostate cancer ([place]) 02/15/2017  . BPH with urinary obstruction 12/13/2012  . CAD (coronary artery disease) 04/09/2011  . GASTRITIS 05/25/2010  . COLONIC POLYPS, ADENOMATOUS, HX OF 04/20/2010  . ANEMIA 10/27/2009  . 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, without long-term current use of insulin ([place]) 07/04/2006  . Hyperlipidemia 07/04/2006  . Essential hypertension 07/04/2006    Immunization History  Administered Date(s) Administered  . Fluad Quad(high Dose 65+) 04/06/2019, 04/28/2020  . Influenza Split 03/05/2011, 04/24/2012  . Influenza Whole 02/27/2008, 03/04/2010  . Influenza, High Dose Seasonal PF 04/01/2017, 03/14/2018  . Influenza,inj,quad, With Preservative 03/07/2013  . Influenza-Unspecified 03/08/2015  . PFIZER(Purple Top)SARS-COV-2 Vaccination 08/13/2019, 09/18/2019  . Pneumococcal Conjugate-13 08/26/2014  . Pneumococcal Polysaccharide-23 02/27/2008    Conditions to be addressed/monitored:  Diabetes, HTN, Hyperlipidemia/CAD, BPH, CKD  There are no care plans that you recently modified to display for  this patient.    Medication Assistance: {MEDASSISTANCEINFO:25044}  Patient's preferred pharmacy is:  [id number] [address]Phone: [phone]: [phone] [place] [place] [address]Phone: [phone]: [phone] [place]Phone:  [phone] Fax: [phone]  Uses pill box? {Yes or If no, why not?:20788} Pt endorses ***% compliance  We discussed: {Pharmacy options:24294} Patient decided to: {US Pharmacy Plan:23885}  Care Plan and Follow Up Patient Decision:  {FOLLOWUP:24991}  Plan: {CM FOLLOW UP PLAN:25073}  *** Current Barriers:  . {pharmacybarriers:24917} . ***  Pharmacist Clinical Goal(s):  [name] Over the next *** days, patient will {PHARMACYGOALCHOICES:24921} through collaboration with PharmD and provider.  . ***  Interventions: . 1:1 collaboration with Carollee Herter, Alferd Apa, DO regarding development and update of comprehensive plan of care as evidenced by provider attestation and co-signature . Inter-disciplinary care team collaboration (see longitudinal plan of care) . Comprehensive medication review performed; medication list updated in electronic medical record  Hypertension (BP goal {CHL HP UPSTREAM Pharmacist BP [phone]}) -{CHL Controlled/Uncontrolled:[phone]} -Current treatment: . *** -Medications previously tried: ***  -Current home readings: *** -Current dietary habits: *** -Current exercise habits: *** -{ACTIONS;DENIES/REPORTS:21021675::"Denies"} hypotensive/hypertensive symptoms -Educated on {CCM BP Counseling:25124} -Counseled to monitor BP at home ***, document, and provide log at future appointments -{CCMPHARMDINTERVENTION:25122}  Hyperlipidemia: (LDL  goal < ***) -{CHL Controlled/Uncontrolled:801-018-1617} -Current treatment: . *** -Medications previously tried: ***  -Current dietary patterns: *** -Current exercise habits: *** -Educated on {CCM HLD Counseling:25126} -{CCMPHARMDINTERVENTION:25122}  Diabetes (A1c goal {A1c goals:23924}) -{CHL Controlled/Uncontrolled:801-018-1617} -Current medications: . *** -Medications previously tried: ***  -Current home glucose readings . fasting glucose: *** . post prandial glucose: *** -{ACTIONS;DENIES/REPORTS:21021675::"Denies"} hypoglycemic/hyperglycemic symptoms -Current meal patterns:  . breakfast: ***  . lunch: ***  . dinner: *** . snacks: *** . drinks: *** -Current exercise: *** -Educated on{CCM DM COUNSELING:25123} -Counseled to check feet daily and get yearly eye exams -{CCMPHARMDINTERVENTION:25122}   Patient Goals/Self-Care Activities . Over the next *** days, patient will:  - {pharmacypatientgoals:24919}  Follow Up Plan: {CM FOLLOW UP VZSM:27078}

## 2020-07-30 ENCOUNTER — Other Ambulatory Visit: Payer: Self-pay

## 2020-07-31 ENCOUNTER — Encounter: Payer: Self-pay | Admitting: Family Medicine

## 2020-07-31 ENCOUNTER — Ambulatory Visit (INDEPENDENT_AMBULATORY_CARE_PROVIDER_SITE_OTHER): Payer: Medicare HMO | Admitting: Family Medicine

## 2020-07-31 ENCOUNTER — Other Ambulatory Visit: Payer: Self-pay

## 2020-07-31 VITALS — BP 162/80 | HR 99 | Temp 97.9°F | Resp 18 | Ht 64.0 in | Wt 122.0 lb

## 2020-07-31 DIAGNOSIS — Z23 Encounter for immunization: Secondary | ICD-10-CM

## 2020-07-31 DIAGNOSIS — C61 Malignant neoplasm of prostate: Secondary | ICD-10-CM

## 2020-07-31 DIAGNOSIS — E1169 Type 2 diabetes mellitus with other specified complication: Secondary | ICD-10-CM

## 2020-07-31 DIAGNOSIS — E1136 Type 2 diabetes mellitus with diabetic cataract: Secondary | ICD-10-CM

## 2020-07-31 DIAGNOSIS — I10 Essential (primary) hypertension: Secondary | ICD-10-CM | POA: Diagnosis not present

## 2020-07-31 DIAGNOSIS — Z0000 Encounter for general adult medical examination without abnormal findings: Secondary | ICD-10-CM | POA: Diagnosis not present

## 2020-07-31 DIAGNOSIS — E785 Hyperlipidemia, unspecified: Secondary | ICD-10-CM | POA: Diagnosis not present

## 2020-07-31 DIAGNOSIS — E1165 Type 2 diabetes mellitus with hyperglycemia: Secondary | ICD-10-CM | POA: Diagnosis not present

## 2020-07-31 LAB — COMPREHENSIVE METABOLIC PANEL
ALT: 12 U/L (ref 0–53)
AST: 12 U/L (ref 0–37)
Albumin: 4.1 g/dL (ref 3.5–5.2)
Alkaline Phosphatase: 81 U/L (ref 39–117)
BUN: 27 mg/dL — ABNORMAL HIGH (ref 6–23)
CO2: 32 mEq/L (ref 19–32)
Calcium: 9.4 mg/dL (ref 8.4–10.5)
Chloride: 105 mEq/L (ref 96–112)
Creatinine, Ser: 1.2 mg/dL (ref 0.40–1.50)
GFR: 54.77 mL/min — ABNORMAL LOW (ref >60.00)
Glucose, Bld: 161 mg/dL — ABNORMAL HIGH (ref 70–99)
Potassium: 4.3 mEq/L (ref 3.5–5.1)
Sodium: 143 mEq/L (ref 135–145)
Total Bilirubin: 0.5 mg/dL (ref 0.2–1.2)
Total Protein: 6.5 g/dL (ref 6.0–8.3)

## 2020-07-31 LAB — CBC WITH DIFFERENTIAL/PLATELET
Basophils Absolute: 0.1 10*3/uL (ref 0.0–0.1)
Basophils Relative: 0.9 % (ref 0.0–3.0)
Eosinophils Absolute: 0.1 10*3/uL (ref 0.0–0.7)
Eosinophils Relative: 0.9 % (ref 0.0–5.0)
HCT: 37.4 % — ABNORMAL LOW (ref 39.0–52.0)
Hemoglobin: 12.5 g/dL — ABNORMAL LOW (ref 13.0–17.0)
Lymphocytes Relative: 26.7 % (ref 12.0–46.0)
Lymphs Abs: 2.3 10*3/uL (ref 0.7–4.0)
MCHC: 33.5 g/dL (ref 30.0–36.0)
MCV: 91.4 fl (ref 78.0–100.0)
Monocytes Absolute: 0.8 10*3/uL (ref 0.1–1.0)
Monocytes Relative: 9.8 % (ref 3.0–12.0)
Neutro Abs: 5.2 10*3/uL (ref 1.4–7.7)
Neutrophils Relative %: 61.7 % (ref 43.0–77.0)
Platelets: 221 10*3/uL (ref 150.0–400.0)
RBC: 4.09 Mil/uL — ABNORMAL LOW (ref 4.22–5.81)
RDW: 13.4 % (ref 11.5–15.5)
WBC: 8.4 10*3/uL (ref 4.0–10.5)

## 2020-07-31 LAB — LIPID PANEL
Cholesterol: 133 mg/dL (ref 0–200)
HDL: 45.6 mg/dL (ref >39.00)
LDL Cholesterol: 75 mg/dL (ref 0–99)
NonHDL: 87.86
Total CHOL/HDL Ratio: 3
Triglycerides: 65 mg/dL (ref 0.0–149.0)
VLDL: 13 mg/dL (ref 0.0–40.0)

## 2020-07-31 LAB — MICROALBUMIN / CREATININE URINE RATIO
Creatinine,U: 115.3 mg/dL
Microalb Creat Ratio: 55 mg/g — ABNORMAL HIGH (ref 0.0–30.0)
Microalb, Ur: 63.4 mg/dL — ABNORMAL HIGH (ref 0.0–1.9)

## 2020-07-31 LAB — HEMOGLOBIN A1C: Hgb A1c MFr Bld: 7.2 % — ABNORMAL HIGH (ref 4.6–6.5)

## 2020-07-31 MED ORDER — AMLODIPINE BESYLATE 10 MG PO TABS: 10.0000 mg | ORAL_TABLET | Freq: Every day | ORAL | 3 refills | Status: DC

## 2020-07-31 MED ORDER — METOPROLOL SUCCINATE ER 50 MG PO TB24: 50.0000 mg | ORAL_TABLET | Freq: Every day | ORAL | 3 refills | Status: DC

## 2020-07-31 NOTE — Assessment & Plan Note (Signed)
Per u rology 

## 2020-07-31 NOTE — Patient Instructions (Signed)
Preventive Care 65 Years and Older, Male Preventive care refers to lifestyle choices and visits with your health care provider that can promote health and wellness. This includes:  A yearly physical exam. This is also called an annual wellness visit.  Regular dental and eye exams.  Immunizations.  Screening for certain conditions.  Healthy lifestyle choices, such as: ? Eating a healthy diet. ? Getting regular exercise. ? Not using drugs or products that contain nicotine and tobacco. ? Limiting alcohol use. What can I expect for my preventive care visit? Physical exam Your health care provider will check your:  Height and weight. These may be used to calculate your BMI (body mass index). BMI is a measurement that tells if you are at a healthy weight.  Heart rate and blood pressure.  Body temperature.  Skin for abnormal spots. Counseling Your health care provider may ask you questions about your:  Past medical problems.  Family's medical history.  Alcohol, tobacco, and drug use.  Emotional well-being.  Home life and relationship well-being.  Sexual activity.  Diet, exercise, and sleep habits.  History of falls.  Memory and ability to understand (cognition).  Work and work environment.  Access to firearms. What immunizations do I need? Vaccines are usually given at various ages, according to a schedule. Your health care provider will recommend vaccines for you based on your age, medical history, and lifestyle or other factors, such as travel or where you work.   What tests do I need? Blood tests  Lipid and cholesterol levels. These may be checked every 5 years, or more often depending on your overall health.  Hepatitis C test.  Hepatitis B test. Screening  Lung cancer screening. You may have this screening every year starting at age 55 if you have a 30-pack-year history of smoking and currently smoke or have quit within the past 15 years.  Colorectal  cancer screening. ? All adults should have this screening starting at age 50 and continuing until age 75. ? Your health care provider may recommend screening at age 45 if you are at increased risk. ? You will have tests every 1-10 years, depending on your results and the type of screening test.  Prostate cancer screening. Recommendations will vary depending on your family history and other risks.  Genital exam to check for testicular cancer or hernias.  Diabetes screening. ? This is done by checking your blood sugar (glucose) after you have not eaten for a while (fasting). ? You may have this done every 1-3 years.  Abdominal aortic aneurysm (AAA) screening. You may need this if you are a current or former smoker.  STD (sexually transmitted disease) testing, if you are at risk. Follow these instructions at home: Eating and drinking  Eat a diet that includes fresh fruits and vegetables, whole grains, lean protein, and low-fat dairy products. Limit your intake of foods with high amounts of sugar, saturated fats, and salt.  Take vitamin and mineral supplements as recommended by your health care provider.  Do not drink alcohol if your health care provider tells you not to drink.  If you drink alcohol: ? Limit how much you have to 0-2 drinks a day. ? Be aware of how much alcohol is in your drink. In the [place] one drink equals one 12 oz bottle of beer (355 mL), one 5 oz glass of wine (148 mL), or one 1 oz glass of hard liquor (44 mL).   Lifestyle  Take daily care of your teeth   and gums. Brush your teeth every morning and night with fluoride toothpaste. Floss one time each day.  Stay active. Exercise for at least 30 minutes 5 or more days each week.  Do not use any products that contain nicotine or tobacco, such as cigarettes, e-cigarettes, and chewing tobacco. If you need help quitting, ask your health care provider.  Do not use drugs.  If you are sexually active, practice safe sex.  Use a condom or other form of protection to prevent STIs (sexually transmitted infections).  Talk with your health care provider about taking a low-dose aspirin or statin.  Find healthy ways to cope with stress, such as: ? Meditation, yoga, or listening to music. ? Journaling. ? Talking to a trusted person. ? Spending time with friends and family. Safety  Always wear your seat belt while driving or riding in a vehicle.  Do not drive: ? If you have been drinking alcohol. Do not ride with someone who has been drinking. ? When you are tired or distracted. ? While texting.  Wear a helmet and other protective equipment during sports activities.  If you have firearms in your house, make sure you follow all gun safety procedures. What's next?  Visit your health care provider once a year for an annual wellness visit.  Ask your health care provider how often you should have your eyes and teeth checked.  Stay up to date on all vaccines. This information is not intended to replace advice given to you by your health care provider. Make sure you discuss any questions you have with your health care provider. Document Revised: 02/20/2019 Document Reviewed: 05/18/2018 Elsevier Patient Education  2021 [employer]  

## 2020-07-31 NOTE — Assessment & Plan Note (Signed)
Tolerating statin, encouraged heart healthy diet, avoid trans fats, minimize simple carbs and saturated fats. Increase exercise as tolerated 

## 2020-07-31 NOTE — Progress Notes (Signed)
Patient ID: [name], male    DOB: [birth year]  Age: 87 y.o. MRN: [id number]    Subjective:  Subjective  HPI [name] presents for cpe and f/u bp.  He was in the er due to high bp ----  Its been better since and he has not needed the hydralazine but it is still high.  No other complaints.   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   Review of Systems  Constitutional: Negative.  Negative for fever.  HENT: Negative for congestion, ear pain, hearing loss, nosebleeds, postnasal drip, rhinorrhea, sinus pressure, sneezing and tinnitus.   Eyes: Negative for photophobia, discharge, itching and visual disturbance.  Respiratory: Negative.  Negative for shortness of breath.   Cardiovascular: Negative.  Negative for chest pain, palpitations and leg swelling.  Gastrointestinal: Negative for abdominal distention, abdominal pain, anal bleeding, blood in stool, constipation and nausea.  Endocrine: Negative.   Genitourinary: Negative.  Negative for dysuria and frequency.  Musculoskeletal: Negative.   Skin: Negative.  Negative for rash.  Allergic/Immunologic: Negative.  Negative for environmental allergies.  Neurological: Negative for dizziness, weakness, light-headedness, numbness and headaches.  Psychiatric/Behavioral: Negative for agitation, confusion, decreased concentration, dysphoric mood, sleep disturbance and suicidal ideas. The patient is not nervous/anxious.     History Past Medical History:  Diagnosis Date  . CAD (coronary artery disease)    LHC 03/25/11 by Dr. Burt Knack:  pLAD 99%, oCFX 20-30%, pOM1 40%, dAVCFX 20-30%.  EF was normal on nuclear study.  He was treated with a Promus DES to his pLAD.   [name] Colon polyps 1996   villous adenoma  . DM type 2 (diabetes mellitus, type 2) ([place]) 2002  . Fuchs' corneal dystrophy   . Hyperlipidemia   . Hypertension   . Macular degeneration    posterior vitreous vitreous detachment  . Prostate cancer [facility]) dx 2018    He has a past surgical  history that includes cataract (Bilateral, 2010); Coronary stent placement (03/25/2011); corenea implants (june and sept 2018); Cardiac catheterization (03/25/2011); Radioactive seed implant (N/A, 05/06/2017); and SPACE OAR INSTILLATION (N/A, 05/06/2017).   His family history includes Coronary artery disease (age of onset: 53) in his father; Stomach cancer (age of onset: 54) in his mother.He reports that he quit smoking about 62 years ago. He has a 0.75 pack-year smoking history. He has never used smokeless tobacco. He reports current alcohol use. He reports that he does not use drugs.  Current Outpatient Medications on File Prior to Visit  Medication Sig Dispense Refill  . Alcohol Swabs PADS Use as directed once a day 100 each 1  . aspirin 81 MG tablet Take 81 mg by mouth daily.    [name] atorvastatin (LIPITOR) 20 MG tablet Take 1 tablet (20 mg total) by mouth daily. 90 tablet 1  . Blood Glucose Calibration (TRUE METRIX LEVEL 1) Low SOLN USE AS DIRECTED 1 each 3  . Blood Glucose Calibration (TRUE METRIX LEVEL 3) High SOLN Use to check controls on glucometer strips every 30 days or with each new bottle of strips (whichever comes first). 3 each 1  . Blood Glucose Monitoring Suppl (TRUE METRIX METER) w/Device KIT USE AS DIRECTED ONCE A DAY 1 kit 0  . brimonidine-timolol (COMBIGAN) 0.2-0.5 % ophthalmic solution Place 1 drop into the right eye 2 (two) times daily.     . fish oil-omega-3 fatty acids 1000 MG capsule Take 2 g by mouth daily.    [name] glimepiride (AMARYL) 2 MG tablet  Take 1 tablet (2 mg total) by mouth daily before breakfast. 90 tablet 1  . glucose blood (TRUE METRIX BLOOD GLUCOSE TEST) test strip USE AS DIRECTED ONCE A DAY 100 strip 3  . losartan-hydrochlorothiazide (HYZAAR) 100-25 MG tablet Take 1 tablet by mouth daily. 90 tablet 1  . Multiple Vitamin (MULTIVITAMIN) tablet Take 1 tablet by mouth daily.    . SitaGLIPtin-MetFORMIN HCl (JANUMET XR) 50-1000 MG TB24 Take 2 tablets by mouth daily. 180  tablet 1  . tamsulosin (FLOMAX) 0.4 MG CAPS capsule TAKE 1 CAPSULE (0.4 MG TOTAL) BY MOUTH DAILY. 90 capsule 1  . TRUEplus Lancets 30G MISC USE AS DIRECTED ONCE A DAY 100 each 3  . TRUEplus Lancets 33G MISC Use to check blood sugar once a day.  DX  E11.9 100 each 1   No current facility-administered medications on file prior to visit.     Objective:  Objective  Physical Exam Vitals and nursing note reviewed.  Constitutional:      General: He is not in acute distress.    Appearance: He is well-developed and well-nourished. He is not diaphoretic.  HENT:     Head: Normocephalic and atraumatic.     Right Ear: External ear normal.     Left Ear: External ear normal.     Nose: Nose normal.     Mouth/Throat:     Mouth: Oropharynx is clear and moist.     Pharynx: No oropharyngeal exudate.  Eyes:     General:        Right eye: No discharge.        Left eye: No discharge.     Extraocular Movements: EOM normal.     Conjunctiva/sclera: Conjunctivae normal.     Pupils: Pupils are equal, round, and reactive to light.  Neck:     Thyroid: No thyromegaly.     Vascular: No JVD.  Cardiovascular:     Rate and Rhythm: Normal rate and regular rhythm.     Pulses: Intact distal pulses.     Heart sounds: Normal heart sounds. No murmur heard. No friction rub. No gallop.   Pulmonary:     Effort: Pulmonary effort is normal. No respiratory distress.     Breath sounds: Normal breath sounds. No wheezing or rales.  Chest:     Chest wall: No tenderness.  Abdominal:     General: Bowel sounds are normal. There is no distension.     Palpations: Abdomen is soft. There is no mass.     Tenderness: There is no abdominal tenderness. There is no guarding or rebound.  Genitourinary:    Comments: Per urology Musculoskeletal:        General: No tenderness or edema. Normal range of motion.     Cervical back: Normal range of motion and neck supple.  Lymphadenopathy:     Cervical: No cervical adenopathy.   Skin:    General: Skin is warm and dry.     Coloration: Skin is not pale.     Findings: No erythema or rash.  Neurological:     Mental Status: He is alert and oriented to person, place, and time.     Motor: No abnormal muscle tone.     Deep Tendon Reflexes: Reflexes are normal and symmetric. Reflexes normal.  Psychiatric:        Mood and Affect: Mood and affect normal.        Behavior: Behavior normal.        Thought Content: Thought content normal.  Judgment: Judgment normal.    BP (!) 162/80 (BP Location: Left Arm, Patient Position: Sitting, Cuff Size: Normal)   Pulse 99   Temp 97.9 F (36.6 C) (Oral)   Resp 18   Ht 5' 4"  (1.626 m)   Wt 122 lb (55.3 kg)   SpO2 99%   BMI 20.94 kg/m  Wt Readings from Last 3 Encounters:  07/31/20 122 lb (55.3 kg)  07/24/20 123 lb (55.8 kg)  04/28/20 128 lb 3.2 oz (58.2 kg)     Lab Results  Component Value Date   WBC 10.0 07/24/2020   HGB 13.5 07/24/2020   HCT 40.6 07/24/2020   PLT 248 07/24/2020   GLUCOSE 179 (H) 07/24/2020   CHOL 150 01/15/2020   TRIG 117.0 01/15/2020   HDL 39.60 01/15/2020   LDLCALC 87 01/15/2020   ALT 11 04/28/2020   AST 12 (L) 04/28/2020   NA 140 07/24/2020   K 3.9 07/24/2020   CL 102 07/24/2020   CREATININE 1.11 07/24/2020   BUN 15 07/24/2020   CO2 29 07/24/2020   TSH 2.47 10/20/2009   PSA 9.32 (H) 08/30/2016   INR 1.03 05/02/2017   HGBA1C 6.6 (A) 04/15/2020   MICROALBUR 57.1 (H) 01/15/2020    DG Chest Port 1 View  Result Date: 07/24/2020 CLINICAL DATA:  Elevated blood pressure. EXAM: PORTABLE CHEST 1 VIEW COMPARISON:  CT scan 03/20/2019 FINDINGS: The cardiac silhouette, mediastinal and hilar contours are within normal limits. Mild tortuosity and calcification of the thoracic aorta. The lungs are clear of an acute process. No pulmonary lesions or pleural effusions. No pneumothorax. The bony thorax is intact. IMPRESSION: No acute cardiopulmonary findings. Electronically Signed   By: Marijo Sanes  M.D.   On: 07/24/2020 11:37     Assessment & Plan:  Plan  I have discontinued Daouda K. Masden's amLODipine and hydrALAZINE. I am also having him start on amLODipine and metoprolol succinate. Additionally, I am having him maintain his fish oil-omega-3 fatty acids, aspirin, multivitamin, brimonidine-timolol, Alcohol Swabs, atorvastatin, losartan-hydrochlorothiazide, True Metrix Level 3, TRUEplus Lancets 33G, Janumet XR, tamsulosin, True Metrix Level 1, TRUEplus Lancets 30G, True Metrix Blood Glucose Test, glimepiride, and True Metrix Meter.  Meds ordered this encounter  Medications  . amLODipine (NORVASC) 10 MG tablet    Sig: Take 1 tablet (10 mg total) by mouth daily.    Dispense:  90 tablet    Refill:  3  . metoprolol succinate (TOPROL-XL) 50 MG 24 hr tablet    Sig: Take 1 tablet (50 mg total) by mouth daily. Take with or immediately following a meal.    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 Increase exercise as tolerated. Continue current meds       Diabetes mellitus ([place])   Relevant Orders   Lipid panel   CBC with Differential/Platelet   Comprehensive metabolic panel   Hemoglobin A1c   Microalbumin / creatinine urine ratio   Essential hypertension    Poorly controlled will alter medications, encouraged DASH diet, minimize caffeine and obtain adequate sleep. Report concerning symptoms and follow up as directed and as needed      Relevant Medications   amLODipine (NORVASC) 10 MG tablet   metoprolol succinate (TOPROL-XL) 50 MG 24 hr tablet  Hyperlipidemia    Tolerating statin, encouraged heart healthy diet, avoid trans fats, minimize simple carbs and saturated fats. Increase exercise as tolerated      Relevant Medications   amLODipine (NORVASC) 10 MG tablet    metoprolol succinate (TOPROL-XL) 50 MG 24 hr tablet   Hyperlipidemia associated with type 2 diabetes mellitus (HCC)   Relevant Orders   Lipid panel   Comprehensive metabolic panel   Preventative health care - Primary   Relevant Orders   Lipid panel   CBC with Differential/Platelet   Comprehensive metabolic panel   Hemoglobin A1c   Microalbumin / creatinine urine ratio   Prostate cancer [facility])    Per urology       Other Visit Diagnoses    Primary hypertension       Relevant Medications   amLODipine (NORVASC) 10 MG tablet   metoprolol succinate (TOPROL-XL) 50 MG 24 hr tablet   Other Relevant Orders   Lipid panel   CBC with Differential/Platelet   Comprehensive metabolic panel   Hemoglobin A1c   Microalbumin / creatinine urine ratio   Need for pneumococcal vaccination       Relevant Orders   Pneumococcal polysaccharide vaccine 23-valent greater than or equal to 2yo subcutaneous/IM (Completed)      Follow-up: Return in about 3 weeks (around 08/21/2020), or if symptoms worsen or fail to improve, for hypertension.  [name], DO

## 2020-07-31 NOTE — Assessment & Plan Note (Signed)
hgba1c per endo, minimize simple carbs. Increase exercise as tolerated. Continue current meds  

## 2020-07-31 NOTE — Assessment & Plan Note (Signed)
Poorly controlled will alter medications, encouraged DASH diet, minimize caffeine and obtain adequate sleep. Report concerning symptoms and follow up as directed and as needed 

## 2020-08-01 ENCOUNTER — Telehealth: Payer: Medicare HMO

## 2020-08-04 ENCOUNTER — Telehealth: Payer: Self-pay | Admitting: Pharmacist

## 2020-08-04 NOTE — Progress Notes (Signed)
    Chronic Care Management Pharmacy Assistant   Name: [name]  MRN: [id number] DOB: July 31, 1931  Reason for Encounter: Adherence Review  PCP : [name], DO  Verified Adherence Gap Information. Per insurance data, the patient is 100% compliant with their CHOL-(Atorvastatin) and DM-(Glimepiride) medication. The patient is 50-59% compliant with the their HTN-(Hydrochlorthiazide/Losartan) medication. Per insurance data patient has met their wellness bundle and annual wellness visit screening. Their most recent A1C was 9.6 on 01/15/20. Their most recent blood pressure was 155/57 on 02/06/20. Their total gaps-all measures is equal 1.  Follow-Up:  Pharmacist Review   Charlann Lange, [place] Pharmacist Assistant [phone]

## 2020-08-07 ENCOUNTER — Telehealth: Payer: Self-pay | Admitting: Pharmacist

## 2020-08-07 NOTE — Progress Notes (Addendum)
Chronic Care Management Pharmacy Assistant   Name: [name]  MRN: [id number] DOB: 18-Sep-1931  Reason for Encounter: Disease State  For DM.  Patient Questions:  1.  Have you seen any other providers since your last visit? No.    2.  Any changes in your medicines or health? Yes.  PCP : [name], DO   Their chronic conditions include: Diabetes, HTN, Hyperlipidemia/CAD, BPH, CKD.  Office Visits: 07/31/20 [name], DO. Follow up from hospital visit. STARTED Metoprolol 50 mg daily, following a meal.   Consults: None since 08/04/20  Hospital: 07/24/20 Varney Biles, MD. For Hyperglycemia and hypertension.  EKG and Xray, Urinalysis and Labs drawn preformed. STARTED Hydralazine 10 mg 3 times daily PRN.  Allergies:  No Known Allergies  Medications: Outpatient Encounter Medications as of 08/07/2020  Medication Sig   Alcohol Swabs PADS Use as directed once a day   amLODipine (NORVASC) 10 MG tablet Take 1 tablet (10 mg total) by mouth daily.   aspirin 81 MG tablet Take 81 mg by mouth daily.   atorvastatin (LIPITOR) 20 MG tablet Take 1 tablet (20 mg total) by mouth daily.   Blood Glucose Calibration (TRUE METRIX LEVEL 1) Low SOLN USE AS DIRECTED   Blood Glucose Calibration (TRUE METRIX LEVEL 3) High SOLN Use to check controls on glucometer strips every 30 days or with each new bottle of strips (whichever comes first).   Blood Glucose Monitoring Suppl (TRUE METRIX METER) w/Device KIT USE AS DIRECTED ONCE A DAY   brimonidine-timolol (COMBIGAN) 0.2-0.5 % ophthalmic solution Place 1 drop into the right eye 2 (two) times daily.    fish oil-omega-3 fatty acids 1000 MG capsule Take 2 g by mouth daily.   glimepiride (AMARYL) 2 MG tablet Take 1 tablet (2 mg total) by mouth daily before breakfast.   glucose blood (TRUE METRIX BLOOD GLUCOSE TEST) test strip USE AS DIRECTED ONCE A DAY   losartan-hydrochlorothiazide (HYZAAR) 100-25 MG tablet Take 1 tablet by mouth daily.    metoprolol succinate (TOPROL-XL) 50 MG 24 hr tablet Take 1 tablet (50 mg total) by mouth daily. Take with or immediately following a meal.   Multiple Vitamin (MULTIVITAMIN) tablet Take 1 tablet by mouth daily.   SitaGLIPtin-MetFORMIN HCl (JANUMET XR) 50-1000 MG TB24 Take 2 tablets by mouth daily.   tamsulosin (FLOMAX) 0.4 MG CAPS capsule TAKE 1 CAPSULE (0.4 MG TOTAL) BY MOUTH DAILY.   TRUEplus Lancets 30G MISC USE AS DIRECTED ONCE A DAY   TRUEplus Lancets 33G MISC Use to check blood sugar once a day.  DX  E11.9   No facility-administered encounter medications on file as of 08/07/2020.    Current Diagnosis: Patient Active Problem List   Diagnosis Date Noted   Diabetes mellitus ([place]) 04/15/2020   Type 2 diabetes mellitus with stage 3b chronic kidney disease, without long-term current use of insulin ([place]) 02/06/2020   Type 2 diabetes mellitus with diabetic polyneuropathy, without long-term current use of insulin ([place]) 02/06/2020   Uncontrolled type 2 diabetes mellitus with hyperglycemia ([place]) 04/06/2019   Hyperlipidemia associated with type 2 diabetes mellitus ([place]) 04/06/2019   Monoclonal gammopathy 04/06/2019   Osteoporosis with pathological fracture of forearm 03/21/2019   Multiple closed fractures of ribs of left side 03/21/2019   Preventative health care 04/01/2017   Prostate cancer ([place]) 02/15/2017   BPH with urinary obstruction 12/13/2012   CAD (coronary artery disease) 04/09/2011   GASTRITIS 05/25/2010   COLONIC POLYPS, ADENOMATOUS, HX  OF 04/20/2010   ANEMIA 10/27/2009   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, without long-term current use of insulin ([place]) 07/04/2006   Hyperlipidemia 07/04/2006   Essential hypertension 07/04/2006    Goals Addressed   None    Recent Relevant Labs: Lab Results  Component Value Date/Time   HGBA1C 7.2 (H) 07/31/2020 09:58 AM   HGBA1C 6.6 (A) 04/15/2020 08:17 AM   HGBA1C 9.6 (H) 01/15/2020 09:58 AM   MICROALBUR 63.4 (H)  07/31/2020 09:58 AM   MICROALBUR 57.1 (H) 01/15/2020 09:58 AM    Kidney Function Lab Results  Component Value Date/Time   CREATININE 1.20 07/31/2020 09:58 AM   CREATININE 1.11 07/24/2020 11:44 AM   CREATININE 1.52 (H) 04/28/2020 08:48 AM   CREATININE 1.59 (H) 10/26/2019 08:54 AM   GFR 54.77 (L) 07/31/2020 09:58 AM   GFRNONAA >60 07/24/2020 11:44 AM   GFRNONAA 44 (L) 04/28/2020 08:48 AM   GFRAA 45 (L) 10/26/2019 08:54 AM    Current antihyperglycemic regimen:  Glimepiride 2 mg daily Janumet 50-1000 mg twice daily  What recent interventions/DTPs have been made to improve glycemic control:  None.  Have there been any recent hospitalizations or ED visits since last visit with CPP?  Yes, 07/24/20 Varney Biles, MD. For Hyperglycemia and hypertension.  EKG and Xray, Urinalysis and Labs drawn preformed. STARTED Hydralazine 10 mg 3 times daily PRN.  Patient denies hypoglycemic symptoms, including None   Patient denies hyperglycemic symptoms, including none   How often are you checking your blood sugar? once daily   What are your blood sugars ranging?  08/04/20 95 08/05/20 102  08/07/20 106  3/3/22117  During the week, how often does your blood glucose drop below 70? Patient stated Never   Are you checking your feet daily/regularly?  Patient stated he check his feet regularly.   Adherence Review: Is the patient currently on a STATIN medication? Yes, Atorvastatin 20 mg  Is the patient currently on ACE/ARB medication? Yes, Losartan-hydrochlorothiazide 100-25 mg   Does the patient have >5 day gap between last estimated fill dates? No  Patient stated he does not have any questions or concerns about this medications at this time. Patient stated he runs and exercises daily.    Follow-Up:  Pharmacist Review   Charlann Lange, RMA Clinical Pharmacist Assistant [phone]  10 minutes spent in review, coordination, and documentation.  Reviewed by: Beverly Milch,  PharmD Clinical Pharmacist [facility] [phone]

## 2020-08-11 DIAGNOSIS — H401112 Primary open-angle glaucoma, right eye, moderate stage: Secondary | ICD-10-CM | POA: Diagnosis not present

## 2020-08-11 DIAGNOSIS — H401121 Primary open-angle glaucoma, left eye, mild stage: Secondary | ICD-10-CM | POA: Diagnosis not present

## 2020-08-12 ENCOUNTER — Other Ambulatory Visit: Payer: Self-pay

## 2020-08-12 ENCOUNTER — Encounter: Payer: Self-pay | Admitting: Internal Medicine

## 2020-08-12 ENCOUNTER — Ambulatory Visit: Payer: Medicare HMO | Admitting: Internal Medicine

## 2020-08-12 VITALS — BP 136/68 | HR 89 | Ht 64.0 in | Wt 128.2 lb

## 2020-08-12 DIAGNOSIS — E1142 Type 2 diabetes mellitus with diabetic polyneuropathy: Secondary | ICD-10-CM | POA: Diagnosis not present

## 2020-08-12 DIAGNOSIS — E1122 Type 2 diabetes mellitus with diabetic chronic kidney disease: Secondary | ICD-10-CM

## 2020-08-12 DIAGNOSIS — N1832 Chronic kidney disease, stage 3b: Secondary | ICD-10-CM | POA: Diagnosis not present

## 2020-08-12 DIAGNOSIS — E1159 Type 2 diabetes mellitus with other circulatory complications: Secondary | ICD-10-CM | POA: Diagnosis not present

## 2020-08-12 LAB — POCT GLUCOSE (DEVICE FOR HOME USE): POC Glucose: 143 mg/dl — AB (ref 70–99)

## 2020-08-12 NOTE — Patient Instructions (Signed)
-   Continue Amaryl 2 mg daily before Breakfast  - Continue Janumet 50-1000 mg TWO tablets with Breakfast        HOW TO TREAT LOW BLOOD SUGARS (Blood sugar LESS THAN 70 MG/DL)  Please follow the RULE OF 15 for the treatment of hypoglycemia treatment (when your (blood sugars are less than 70 mg/dL)    STEP 1: Take 15 grams of carbohydrates when your blood sugar is low, which includes:   3-4 GLUCOSE TABS  OR  3-4 OZ OF JUICE OR REGULAR SODA OR  ONE TUBE OF GLUCOSE GEL     STEP 2: RECHECK blood sugar in 15 MINUTES STEP 3: If your blood sugar is still low at the 15 minute recheck --> then, go back to STEP 1 and treat AGAIN with another 15 grams of carbohydrates.

## 2020-08-12 NOTE — Progress Notes (Signed)
Name: [name]  Age/ Sex: 87 y.o., male   MRN/ DOB: [id number], 1932-03-18     PCP: Carollee Herter, Alferd Apa, DO   Reason for Endocrinology Evaluation: Type 2 Diabetes Mellitus  Initial Endocrine Consultative Visit: 02/06/2020    PATIENT IDENTIFIER: [name] is a 87 y.o. male with a past medical history of T2DM, CAD and HTN. The patient has followed with Endocrinology clinic since 02/06/2020 for consultative assistance with management of his diabetes.  DIABETIC HISTORY:  [name] was diagnosed with DM in 2008, has been on Glimepiride for years, janumet started in 01/2020. His hemoglobin A1c has ranged from 6.7% in 2017, peaking at 9.6%in 2021.   On initial visit his A1c was 9.6 % , he was just started on Janumet and we did not make any changes and continued Glimepiride.   SUBJECTIVE:   During the last visit (04/15/2020): A1c 6.6 %. We continued  Janumet  And decreased Glimepiride   Today (08/12/2020): [name] is here for a follow up on diabetes management.  He checks his blood sugars 1 times daily, preprandial to breakfast. The patient has not hypoglycemic episodes since the last clinic visit.   Denies nausea or diarrhea Denies frequency     HOME DIABETES REGIMEN:  Glimepiride 2 mg daily  Janumet 50-1000 mg, 2 tabs daily     Statin: yes ACE-I/ARB: yes   GLUCOSE LOG: 95- 161  mg/dL   DIABETIC COMPLICATIONS: Microvascular complications:  CKD III Denies: retinopathy, neuropathy Last Eye Exam: Completed 07/2020  Macrovascular complications:  CAD ( S/P stent) Denies:  CVA, PVD   HISTORY:  Past Medical History:  Past Medical History:  Diagnosis Date  . CAD (coronary artery disease)    LHC 03/25/11 by Dr. Burt Knack:  pLAD 99%, oCFX 20-30%, pOM1 40%, dAVCFX 20-30%.  EF was normal on nuclear study.  He was treated with a Promus DES to his pLAD.   [name] Colon polyps 1996   villous adenoma  . DM type 2 (diabetes mellitus, type 2) ([place]) 2002  . Fuchs' corneal  dystrophy   . Hyperlipidemia   . Hypertension   . Macular degeneration    posterior vitreous vitreous detachment  . Prostate cancer [facility]) dx 2018   Past Surgical History:  Past Surgical History:  Procedure Laterality Date  . CARDIAC CATHETERIZATION  03/25/2011  . cataract Bilateral 2010  . corenea implants  june and sept 2018   [name]  . CORONARY STENT PLACEMENT  03/25/2011  . RADIOACTIVE SEED IMPLANT N/A 05/06/2017   Procedure: RADIOACTIVE SEED IMPLANT/BRACHYTHERAPY IMPLANT;  Surgeon: Kathie Rhodes, MD;  Location: [place];  Service: Urology;  Laterality: N/A;  . SPACE OAR INSTILLATION N/A 05/06/2017   Procedure: SPACE OAR INSTILLATION;  Surgeon: Kathie Rhodes, MD;  Location: [facility];  Service: Urology;  Laterality: N/A;   Social History:  reports that he quit smoking about 62 years ago. He has a 0.75 pack-year smoking history. He has never used smokeless tobacco. He reports current alcohol use. He reports that he does not use drugs. Family History:  Family History  Problem Relation Age of Onset  . Coronary artery disease Father 80       deceased  . Stomach cancer Mother 8     HOME MEDICATIONS: Allergies as of 08/12/2020   No Known Allergies     Medication List       Accurate as of August 12, 2020  8:29  AM. If you have any questions, ask your nurse or doctor.        Alcohol Swabs Pads Use as directed once a day   amLODipine 10 MG tablet Commonly known as: NORVASC Take 1 tablet (10 mg total) by mouth daily.   aspirin 81 MG tablet Take 81 mg by mouth daily.   atorvastatin 20 MG tablet Commonly known as: LIPITOR Take 1 tablet (20 mg total) by mouth daily.   brimonidine-timolol 0.2-0.5 % ophthalmic solution Commonly known as: COMBIGAN Place 1 drop into both eyes 2 (two) times daily.   fish oil-omega-3 fatty acids 1000 MG capsule Take 2 g by mouth daily.   glimepiride 2 MG tablet Commonly known as: AMARYL Take  1 tablet (2 mg total) by mouth daily before breakfast.   Janumet XR 50-1000 MG Tb24 Generic drug: SitaGLIPtin-MetFORMIN HCl Take 2 tablets by mouth daily.   losartan-hydrochlorothiazide 100-25 MG tablet Commonly known as: HYZAAR Take 1 tablet by mouth daily.   metoprolol succinate 50 MG 24 hr tablet Commonly known as: TOPROL-XL Take 1 tablet (50 mg total) by mouth daily. Take with or immediately following a meal.   multivitamin tablet Take 1 tablet by mouth daily.   tamsulosin 0.4 MG Caps capsule Commonly known as: FLOMAX TAKE 1 CAPSULE (0.4 MG TOTAL) BY MOUTH DAILY.   True Metrix Blood Glucose Test test strip Generic drug: glucose blood USE AS DIRECTED ONCE A DAY   True Metrix Level 3 High Soln Use to check controls on glucometer strips every 30 days or with each new bottle of strips (whichever comes first).   True Metrix Level 1 Low Soln USE AS DIRECTED   True Metrix Meter w/Device Kit USE AS DIRECTED ONCE A DAY   TRUEplus Lancets 33G Misc Use to check blood sugar once a day.  DX  E11.9   TRUEplus Lancets 30G Misc USE AS DIRECTED ONCE A DAY        OBJECTIVE:   Vital Signs: BP 136/68   Pulse 89   Ht 5' 4"  (1.626 m)   Wt 128 lb 4 oz (58.2 kg)   SpO2 98%   BMI 22.01 kg/m   Wt Readings from Last 3 Encounters:  08/12/20 128 lb 4 oz (58.2 kg)  07/31/20 122 lb (55.3 kg)  07/24/20 123 lb (55.8 kg)     Exam: General: Pt appears well and is in NAD  Lungs: Clear with good BS bilat with no rales, rhonchi, or wheezes  Heart: RRR with normal S1 and S2 and no gallops; no murmurs; no rub  Abdomen: Normoactive bowel sounds, soft, nontender, without masses or organomegaly palpable  Extremities: No pretibial edema..  Neuro: MS is good with appropriate affect, pt is alert and Ox3   DM foot exam: 02/06/2020  The skin of the feet is intact without sores or ulcerations. The pedal pulses are 1+ on right and 1+ on left. The sensation is decreased to a screening 5.07,  10 gram monofilament on the right          DATA REVIEWED:  Lab Results  Component Value Date   HGBA1C 7.2 (H) 07/31/2020   HGBA1C 6.6 (A) 04/15/2020   HGBA1C 9.6 (H) 01/15/2020   Lab Results  Component Value Date   MICROALBUR 63.4 (H) 07/31/2020   LDLCALC 75 07/31/2020   CREATININE 1.20 07/31/2020   Lab Results  Component Value Date   MICRALBCREAT 55.0 (H) 07/31/2020     Lab Results  Component Value Date  CHOL 133 07/31/2020   HDL 45.60 07/31/2020   LDLCALC 75 07/31/2020   TRIG 65.0 07/31/2020   CHOLHDL 3 07/31/2020         ASSESSMENT / PLAN / RECOMMENDATIONS:   1) Type 2 Diabetes Mellitus, With CKD III and neuropathic and macrovascular complications - Most recent A1c of 7.2 %. Goal A1c < 7.0 %.      - Hypoglycemic resolved, he has not had any since we reduced Glimepiride to 2 mg -He is tolerating the Janumet without any side effects, we will continue to monitor his GFR, and adjust Janumet appropriately. - No changes today     MEDICATIONS: Continue Glimepiride  2 mg daily Continue Janumet 50-1000 mg, 2 tabs daily  EDUCATION / INSTRUCTIONS: BG monitoring instructions: Patient is instructed to check his blood sugars 1 times a day. Call [facility] if: BG persistently < 70  I reviewed the Rule of 15 for the treatment of hypoglycemia in detail with the patient. Literature supplied.    2) Diabetic complications:  Eye: Does not have known diabetic retinopathy.  Neuro/ Feet: Does have known diabetic peripheral neuropathy .  Renal: Patient does  have known baseline CKD. He   is  on an ACEI/ARB at present.    F/U in 6 months   Signed electronically by: Mack Guise, MD  [facility] End[facility]  [address] [place], [place] [address] Phone: [phone] FAX: [phone]   CC: [address] [place] [address] Phone: [phone]  Fax:  [phone]  Return to Endocrinology clinic as below: Future Appointments  Date Time Provider [place]  08/12/2020  8:30 AM Yvana Samonte, Melanie Crazier, MD [facility]  08/15/2020 10:00 AM [facility] C[facility] PHARMACIST [facility]  08/21/2020  8:20 AM [name], DO [facility]  09/11/2020  9:00 AM [facility] HEALTH COACH [facility]  10/27/2020  9:30 AM [facility] None  10/27/2020 10:00 AM Orson Slick, MD [facility] None

## 2020-08-13 ENCOUNTER — Other Ambulatory Visit: Payer: Self-pay | Admitting: Internal Medicine

## 2020-08-15 ENCOUNTER — Ambulatory Visit (INDEPENDENT_AMBULATORY_CARE_PROVIDER_SITE_OTHER): Payer: Medicare HMO | Admitting: Pharmacist

## 2020-08-15 DIAGNOSIS — E1136 Type 2 diabetes mellitus with diabetic cataract: Secondary | ICD-10-CM | POA: Diagnosis not present

## 2020-08-15 DIAGNOSIS — E785 Hyperlipidemia, unspecified: Secondary | ICD-10-CM

## 2020-08-15 DIAGNOSIS — I10 Essential (primary) hypertension: Secondary | ICD-10-CM

## 2020-08-15 NOTE — Progress Notes (Signed)
Chronic Care Management Pharmacy Note  08/15/2020 Name:  [name] MRN:  [id number] DOB:  Apr 22, 1932  Subjective: [name] is an 87 y.o. year old male who is a primary patient of [name], DO.  The CCM team was consulted for assistance with disease management and care coordination needs.    Engaged with patient by telephone for follow up visit in response to provider referral for pharmacy case management and/or care coordination services.   Consent to Services:  The patient was given the following information about Chronic Care Management services today, agreed to services, and gave verbal consent: 1. CCM service includes personalized support from designated clinical staff supervised by the primary care provider, including individualized plan of care and coordination with other care providers 2. 24/7 contact phone numbers for assistance for urgent and routine care needs. 3. Service will only be billed when office clinical staff spend 20 minutes or more in a month to coordinate care. 4. Only one practitioner may furnish and bill the service in a calendar month. 5.The patient may stop CCM services at any time (effective at the end of the month) by phone call to the office staff. 6. The patient will be responsible for cost sharing (co-pay) of up to 20% of the service fee (after annual deductible is met). Patient agreed to services and consent obtained.  Patient Care Team: Carollee Herter, Alferd Apa, DO as PCP - General (Family Medicine) Kathie Rhodes, MD (Inactive) as Consulting Physician (Urology) Tyler Pita, MD as Consulting Physician (Radiation Oncology) Marilynne Halsted, MD as Referring Physician (Ophthalmology) Orson Slick, MD as Consulting Physician (Hematology and Oncology) [facility], Melanie Crazier, MD as Consulting Physician (Endocrinology) Robley Fries, MD as Consulting Physician (Urology) Edythe Clarity, [facility] (Pharmacist)  Recent office  visits: 07/31/20 [name], DO. Follow up from hospital visit. STARTED Metoprolol 50 mg daily, following a meal.   Recent consult visits: 08/12/20 [facility]) - no medication changes noted. Regular follow up and monitoring GFR for Janumet dosing.  Hospital visits: 07/24/20 (ED) - Hypertensive urgency, patient was started on Hydralazine 59m tid  Objective:  Lab Results  Component Value Date   CREATININE 1.20 07/31/2020   BUN 27 (H) 07/31/2020   GFR 54.77 (L) 07/31/2020   GFRNONAA >60 07/24/2020   GFRAA 45 (L) 10/26/2019   NA 143 07/31/2020   K 4.3 07/31/2020   CALCIUM 9.4 07/31/2020   CO2 32 07/31/2020    Lab Results  Component Value Date/Time   HGBA1C 7.2 (H) 07/31/2020 09:58 AM   HGBA1C 6.6 (A) 04/15/2020 08:17 AM   HGBA1C 9.6 (H) 01/15/2020 09:58 AM   GFR 54.77 (L) 07/31/2020 09:58 AM   GFR 44.38 (L) 01/15/2020 09:58 AM   MICROALBUR 63.4 (H) 07/31/2020 09:58 AM   MICROALBUR 57.1 (H) 01/15/2020 09:58 AM    Last diabetic Eye exam:  Lab Results  Component Value Date/Time   HMDIABEYEEXA No Retinopathy 08/28/2015 12:00 AM    Last diabetic Foot exam:  Lab Results  Component Value Date/Time   HMDIABFOOTEX done 10/23/2013 12:00 AM     Lab Results  Component Value Date   CHOL 133 07/31/2020   HDL 45.60 07/31/2020   LDLCALC 75 07/31/2020   TRIG 65.0 07/31/2020   CHOLHDL 3 07/31/2020    Hepatic Function Latest Ref Rng & Units 07/31/2020 04/28/2020 01/15/2020  Total Protein 6.0 - 8.3 g/dL 6.5 7.0 7.0  Albumin 3.5 - 5.2 g/dL 4.1 4.0 4.1  AST  0 - 37 U/L 12 12(L) 13  ALT 0 - 53 U/L _0 Alk Phosphatase 39 - 117 U/L 81 85 76  Total Bilirubin 0.2 - 1.2 mg/dL 0.5 0.7 0.5  Bilirubin, Direct 0.0 - 0.3 mg/dL - - -    Lab Results  Component Value Date/Time   TSH 2.47 10/20/2009 08:42 AM    CBC Latest Ref Rng & Units 07/31/2020 07/24/2020 04/28/2020  WBC 4.0 - 10.5 K/uL 8.4 10.0 10.8(H)  Hemoglobin 13.0 - 17.0 g/dL 12.5(L) 13.5 12.2(L)  Hematocrit 39.0  - 52.0 % 37.4(L) 40.6 37.0(L)  Platelets 150.0 - 400.0 K/uL 221.0 248 243    No results found for: VD25OH  Clinical ASCVD: Yes  The ASCVD Risk score Mikey Bussing DC Jr., et al., 2013) failed to calculate for the following reasons:   The 2013 ASCVD risk score is only valid for ages 102 to 22    Depression screen PHQ 2/9 03/24/2020 09/10/2019 04/04/2018  Decreased Interest 0 0 0  Down, Depressed, Hopeless 0 0 0  PHQ - 2 Score 0 0 0      Social History   Tobacco Use  Smoking Status Former Smoker  . Packs/day: 0.25  . Years: 3.00  . Pack years: 0.75  . Quit date: 06/07/1958  . Years since quitting: 62.2  Smokeless Tobacco Never Used   BP Readings from Last 3 Encounters:  08/12/20 136/68  07/31/20 (!) 162/80  07/24/20 (!) 167/63   Pulse Readings from Last 3 Encounters:  08/12/20 89  07/31/20 99  07/24/20 74   Wt Readings from Last 3 Encounters:  08/12/20 128 lb 4 oz (58.2 kg)  07/31/20 122 lb (55.3 kg)  07/24/20 123 lb (55.8 kg)    Assessment/Interventions: Review of patient past medical history, allergies, medications, health status, including review of consultants reports, laboratory and other test data, was performed as part of comprehensive evaluation and provision of chronic care management services.   SDOH:  (Social Determinants of Health) assessments and interventions performed: No   CCM Care Plan  No Known Allergies  Medications Reviewed Today    Reviewed by Edythe Clarity, [facility] (Pharmacist) on 08/15/20 at [place] List Status: <None>  Medication Order Taking? Sig Documenting Provider Last Dose Status Informant  Alcohol Swabs PADS [id number] Yes Use as directed once a day Roma Schanz R, DO Taking Active   amLODipine (NORVASC) 10 MG tablet [id number] Yes Take 1 tablet (10 mg total) by mouth daily. Roma Schanz R, DO Taking Active   aspirin 81 MG tablet [id number] Yes Take 81 mg by mouth daily. [provider] Taking Active Self  atorvastatin  (LIPITOR) 20 MG tablet [id number] Yes Take 1 tablet (20 mg total) by mouth daily. Roma Schanz R, DO Taking Active   Blood Glucose Calibration (TRUE METRIX LEVEL 1) Low SOLN [id number] Yes USE AS DIRECTED [name], Melanie Crazier, MD Taking Active   Blood Glucose Calibration (TRUE METRIX LEVEL 3) High SOLN [id number] Yes Use to check controls on glucometer strips every 30 days or with each new bottle of strips (whichever comes first). Roma Schanz R, DO Taking Active   Blood Glucose Monitoring Suppl (TRUE METRIX METER) w/Device Drucie Opitz [id number] Yes USE AS DIRECTED ONCE A DAY [name], Melanie Crazier, MD Taking Active   brimonidine-timolol (COMBIGAN) 0.2-0.5 % ophthalmic solution [id number] Yes Place 1 drop into both eyes 2 (two) times daily. [provider] Taking Active Self  fish oil-omega-3 fatty acids 1000 MG capsule  [id number] Yes Take 2 g by mouth daily. [provider] Taking Active Self  glimepiride (AMARYL) 2 MG tablet [id number] Yes Take 1 tablet (2 mg total) by mouth daily before breakfast. [name], Melanie Crazier, MD Taking Active   glucose blood (TRUE METRIX BLOOD GLUCOSE TEST) test strip [id number] Yes USE AS DIRECTED ONCE A DAY [name], Melanie Crazier, MD Taking Active   losartan-hydrochlorothiazide (HYZAAR) 100-25 MG tablet [id number] Yes Take 1 tablet by mouth daily. [name], DO Taking Active   metoprolol succinate (TOPROL-XL) 50 MG 24 hr tablet [id number] Yes Take 1 tablet (50 mg total) by mouth daily. Take with or immediately following a meal. Carollee Herter, Alferd Apa, DO Taking Active   Multiple Vitamin (MULTIVITAMIN) tablet [id number] Yes Take 1 tablet by mouth daily. [provider] Taking Active Self  SitaGLIPtin-MetFORMIN HCl (JANUMET XR) 50-1000 MG TB24 [id number] Yes Take 2 tablets by mouth daily. Roma Schanz R, DO Taking Active   tamsulosin (FLOMAX) 0.4 MG CAPS capsule [id number] Yes TAKE 1 CAPSULE (0.4 MG TOTAL) BY  MOUTH DAILY. [name], DO Taking Active   TRUEplus Lancets 30G MISC [id number] Yes USE AS DIRECTED ONCE A DAY [name], Melanie Crazier, MD Taking Active   TRUEplus Lancets 33G [place] [id number] Yes Use to check blood sugar once a day.  DX  E11.9 [name], DO Taking Active           Patient Active Problem List   Diagnosis Date Noted  . Diabetes mellitus ([place]) 04/15/2020  . Type 2 diabetes mellitus with stage 3b chronic kidney disease, without long-term current use of insulin ([place]) 02/06/2020  . Type 2 diabetes mellitus with diabetic polyneuropathy, without long-term current use of insulin ([place]) 02/06/2020  . Uncontrolled type 2 diabetes mellitus with hyperglycemia ([place]) 04/06/2019  . Hyperlipidemia associated with type 2 diabetes mellitus ([place]) 04/06/2019  . Monoclonal gammopathy 04/06/2019  . Osteoporosis with pathological fracture of forearm 03/21/2019  . Multiple closed fractures of ribs of left side 03/21/2019  . Preventative health care 04/01/2017  . Prostate cancer ([place]) 02/15/2017  . BPH with urinary obstruction 12/13/2012  . CAD (coronary artery disease) 04/09/2011  . GASTRITIS 05/25/2010  . COLONIC POLYPS, ADENOMATOUS, HX OF 04/20/2010  . ANEMIA 10/27/2009  . 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, without long-term current use of insulin ([place]) 07/04/2006  . Hyperlipidemia 07/04/2006  . Essential hypertension 07/04/2006    Immunization History  Administered Date(s) Administered  . Fluad Quad(high Dose 65+) 04/06/2019, 04/28/2020  . Influenza Split 03/05/2011, 04/24/2012  . Influenza Whole 02/27/2008, 03/04/2010  . Influenza, High Dose Seasonal PF 04/01/2017, 03/14/2018  . Influenza,inj,quad, With Preservative 03/07/2013  . Influenza-Unspecified 03/08/2015  . PFIZER(Purple Top)SARS-COV-2 Vaccination 08/13/2019, 09/18/2019, 03/21/2020  . Pneumococcal Conjugate-13 08/26/2014  . Pneumococcal Polysaccharide-23 02/27/2008,  07/31/2020    Conditions to be addressed/monitored:  Diabetes, HTN, Hyperlipidemia/CAD, BPH, CKD   Care Plan : General Pharmacy (Adult)  Updates made by Edythe Clarity, RPH since 08/15/2020 12:00 AM    Problem: HTN, HLD, DM   Priority: High  Onset Date: 08/15/2020    Long-Range Goal: Patient-Specific Goal   Start Date: 08/15/2020  Expected End Date: 02/15/2021  This Visit's Progress: On track  Priority: High  Note:   Current Barriers:  . No specific barriers identified today.  Pharmacist Clinical Goal(s):  [name] Over the next 120 days, patient will achieve adherence to monitoring guidelines and medication adherence to achieve therapeutic efficacy . maintain control of  blood pressure and blood sugar as evidenced by home monitoring  . adhere to prescribed medication regimen as evidenced by fill dates through collaboration with PharmD and provider.   Interventions: . 1:1 collaboration with Carollee Herter, Alferd Apa, DO regarding development and update of comprehensive plan of care as evidenced by provider attestation and co-signature . Inter-disciplinary care team collaboration (see longitudinal plan of care) . Comprehensive medication review performed; medication list updated in electronic medical record  Hypertension (BP goal <130/80) -Controlled -Current treatment:  Amlodipine 10 mg daily   Losartan/HCTZ 100-25 mg daily   Toprol XL 57m daily -Medications previously tried: hydralazine (d/c post hospital) -Current home readings: 120-130/70s -Current exercise habits: He is very active, runs or walks almost daily.  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 -Denies hypotensive/hypertensive symptoms -Educated on BP goals and benefits of medications for prevention of heart attack, stroke and kidney damage; Importance of home blood pressure monitoring; -Counseled to monitor BP at home daily, document, and provide log at future appointments -Per dispense report his  amlodipine was late to fill, patient confirmed he does have them at home and they mail order pharmacy had him overstocked.  This is why he has not filled them.  He reports 100% compliance. -Recommended to continue current medication.  Hyperlipidemia: (LDL goal < 70) -Controlled -Current treatment:  Aspirin 81 mg daily  Atorvastatin 20 mg daily  Fish Oil 2g daily -Medications previously tried: simvastatin (amlodipine interaction) -Current exercise habits: see above -Educated on Cholesterol goals;  Benefits of statin for ASCVD risk reduction;  -LDL improved at most recent visit, congratulated patient -Still not quite to goal, considering his age this is acceptable and I would make no changes -Recommended to continue current medication  Diabetes (A1c goal <7%) -Not ideally controlled -Current medications: ? Glimepiride 231mdaily ? Janumet 50-100022mwice daily -Medications previously tried: none noted  -Current home glucose readings . fasting glucose: 108, 131, 120 -Denies hypoglycemic/hyperglycemic symptoms -Current exercise: see above -Educated on A1c and blood sugar goals; Benefits of routine self-monitoring of blood sugar; Carbohydrate limitation -Counseled to check feet daily and get yearly eye exams -A1c up to 7.2, patient aware and plans to work to bring it back down -Recommended to continue current medication  -Counseled on staying active    Patient Goals/Self-Care Activities . Over the next 120 days, patient will:  - take medications as prescribed check glucose daily, document, and provide at future appointments check blood pressure daily, document, and provide at future appointments target a minimum of 150 minutes of moderate intensity exercise weekly  Follow Up Plan: The care management team will reach out to the patient again over the next 120 days.        Medication Assistance: None required.  Patient affirms current coverage meets needs.  Patient's  [facility]  [id number] [id number]: [phone]: [address] [facility] [place]: [phone]: [id number]: [phone]: [phone] pill box? No -  he does not like them Pt endorses 100% compliance  We discussed: Benefits of medication synchronization, packaging and delivery as well as enhanced pharmacist oversight with [employer]. Patient decided to: Continue current medication  management strategy  Care Plan and Follow Up Patient Decision:  Patient agrees to Care Plan and Follow-up.  Plan: The care management team will reach out to the patient again over the next 120 days.  Beverly Milch, PharmD Clinical Pharmacist [place] [phone]

## 2020-08-15 NOTE — Patient Instructions (Addendum)
Visit Information  Goals Addressed            This Visit's Progress   . Manage My Medicine       Timeframe:  Long-Range Goal Priority:  High Start Date:   08/15/20                          Expected End Date:   02/15/21                    Follow Up Date 11/15/20   - call for medicine refill 2 or 3 days before it runs out - keep a list of all the medicines I take; vitamins and herbals too    Why is this important?   . These steps will help you keep on track with your medicines.   Notes:       Patient Care Plan: General Pharmacy (Adult)    Problem Identified: HTN, HLD, DM   Priority: High  Onset Date: 08/15/2020    Long-Range Goal: Patient-Specific Goal   Start Date: 08/15/2020  Expected End Date: 02/15/2021  This Visit's Progress: On track  Priority: High  Note:   Current Barriers:  . No specific barriers identified today.  Pharmacist Clinical Goal(s):  [name] Over the next 120 days, patient will achieve adherence to monitoring guidelines and medication adherence to achieve therapeutic efficacy . maintain control of blood pressure and blood sugar as evidenced by home monitoring  . adhere to prescribed medication regimen as evidenced by fill dates through collaboration with PharmD and provider.   Interventions: . 1:1 collaboration with Carollee Herter, Alferd Apa, DO regarding development and update of comprehensive plan of care as evidenced by provider attestation and co-signature . Inter-disciplinary care team collaboration (see longitudinal plan of care) . Comprehensive medication review performed; medication list updated in electronic medical record  Hypertension (BP goal <130/80) -Controlled -Current treatment:  Amlodipine 10 mg daily   Losartan/HCTZ 100-25 mg daily   Toprol XL 50mg  daily -Medications previously tried: hydralazine (d/c post hospital) -Current home readings: 120-130/70s -Current exercise habits: He is very active, runs or walks almost daily.  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 -Denies hypotensive/hypertensive symptoms -Educated on BP goals and benefits of medications for prevention of heart attack, stroke and kidney damage; Importance of home blood pressure monitoring; -Counseled to monitor BP at home daily, document, and provide log at future appointments -Per dispense report his amlodipine was late to fill, patient confirmed he does have them at home and they mail order pharmacy had him overstocked.  This is why he has not filled them.  He reports 100% compliance. -Recommended to continue current medication.  Hyperlipidemia: (LDL goal < 70) -Controlled -Current treatment:  Aspirin 81 mg daily  Atorvastatin 20 mg daily  Fish Oil 2g daily -Medications previously tried: simvastatin (amlodipine interaction) -Current exercise habits: see above -Educated on Cholesterol goals;  Benefits of statin for ASCVD risk reduction;  -LDL improved at most recent visit, congratulated patient -Still not quite to goal, considering his age this is acceptable and I would make no changes -Recommended to continue current medication  Diabetes (A1c goal <7%) -Not ideally controlled -Current medications: ? Glimepiride 2mg  daily ? Janumet 50-1000mg  twice daily -Medications previously tried: none noted  -Current home glucose readings . fasting glucose: 108, 131, 120 -Denies hypoglycemic/hyperglycemic symptoms -Current exercise: see above -Educated on A1c and blood sugar goals; Benefits of routine self-monitoring of  blood sugar; Carbohydrate limitation -Counseled to check feet daily and get yearly eye exams -A1c up to 7.2, patient aware and plans to work to bring it back down -Recommended to continue current medication  -Counseled on staying active    Patient Goals/Self-Care Activities . Over the next 120 days, patient will:  - take medications as prescribed check glucose daily, document, and provide at future  appointments check blood pressure daily, document, and provide at future appointments target a minimum of 150 minutes of moderate intensity exercise weekly  Follow Up Plan: The care management team will reach out to the patient again over the next 120 days.        The patient verbalized understanding of instructions, educational materials, and care plan provided today and agreed to receive a mailed copy of patient instructions, educational materials, and care plan.  Telephone follow up appointment with pharmacy team member scheduled for: 4 months  Edythe Clarity, [facility]  Hypertension, Adult High blood pressure (hypertension) is when the force of blood pumping through the arteries is too strong. The arteries are the blood vessels that carry blood from the heart throughout the body. Hypertension forces the heart to work harder to pump blood and may cause arteries to become narrow or stiff. Untreated or uncontrolled hypertension can cause a heart attack, heart failure, a stroke, kidney disease, and other problems. A blood pressure reading consists of a higher number over a lower number. Ideally, your blood pressure should be below 120/80. The first ("top") number is called the systolic pressure. It is a measure of the pressure in your arteries as your heart beats. The second ("bottom") number is called the diastolic pressure. It is a measure of the pressure in your arteries as the heart relaxes. What are the causes? The exact cause of this condition is not known. There are some conditions that result in or are related to high blood pressure. What increases the risk? Some risk factors for high blood pressure are under your control. The following factors may make you more likely to develop this condition:  Smoking.  Having type 2 diabetes mellitus, high cholesterol, or both.  Not getting enough exercise or physical activity.  Being overweight.  Having too much fat, sugar, calories, or salt  (sodium) in your diet.  Drinking too much alcohol. Some risk factors for high blood pressure may be difficult or impossible to change. Some of these factors include:  Having chronic kidney disease.  Having a family history of high blood pressure.  Age. Risk increases with age.  Race. You may be at higher risk if you are African American.  Gender. Men are at higher risk than women before age 59. After age 68, women are at higher risk than men.  Having obstructive sleep apnea.  Stress. What are the signs or symptoms? High blood pressure may not cause symptoms. Very high blood pressure (hypertensive crisis) may cause:  Headache.  Anxiety.  Shortness of breath.  Nosebleed.  Nausea and vomiting.  Vision changes.  Severe chest pain.  Seizures. How is this diagnosed? This condition is diagnosed by measuring your blood pressure while you are seated, with your arm resting on a flat surface, your legs uncrossed, and your feet flat on the floor. The cuff of the blood pressure monitor will be placed directly against the skin of your upper arm at the level of your heart. It should be measured at least twice using the same arm. Certain conditions can cause a difference in  blood pressure between your right and left arms. Certain factors can cause blood pressure readings to be lower or higher than normal for a short period of time:  When your blood pressure is higher when you are in a health care provider's office than when you are at home, this is called white coat hypertension. Most people with this condition do not need medicines.  When your blood pressure is higher at home than when you are in a health care provider's office, this is called masked hypertension. Most people with this condition may need medicines to control blood pressure. If you have a high blood pressure reading during one visit or you have normal blood pressure with other risk factors, you may be asked to:  Return  on a different day to have your blood pressure checked again.  Monitor your blood pressure at home for 1 week or longer. If you are diagnosed with hypertension, you may have other blood or imaging tests to help your health care provider understand your overall risk for other conditions. How is this treated? This condition is treated by making healthy lifestyle changes, such as eating healthy foods, exercising more, and reducing your alcohol intake. Your health care provider may prescribe medicine if lifestyle changes are not enough to get your blood pressure under control, and if:  Your systolic blood pressure is above 130.  Your diastolic blood pressure is above 80. Your personal target blood pressure may vary depending on your medical conditions, your age, and other factors. Follow these instructions at home: Eating and drinking  Eat a diet that is high in fiber and potassium, and low in sodium, added sugar, and fat. An example eating plan is called the DASH (Dietary Approaches to Stop Hypertension) diet. To eat this way: ? Eat plenty of fresh fruits and vegetables. Try to fill one half of your plate at each meal with fruits and vegetables. ? Eat whole grains, such as whole-wheat pasta, brown rice, or whole-grain bread. Fill about one fourth of your plate with whole grains. ? Eat or drink low-fat dairy products, such as skim milk or low-fat yogurt. ? Avoid fatty cuts of meat, processed or cured meats, and poultry with skin. Fill about one fourth of your plate with lean proteins, such as fish, chicken without skin, beans, eggs, or tofu. ? Avoid pre-made and processed foods. These tend to be higher in sodium, added sugar, and fat.  Reduce your daily sodium intake. Most people with hypertension should eat less than 1,500 mg of sodium a day.  Do not drink alcohol if: ? Your health care provider tells you not to drink. ? You are pregnant, may be pregnant, or are planning to become  pregnant.  If you drink alcohol: ? Limit how much you use to:  0-1 drink a day for women.  0-2 drinks a day for men. ? Be aware of how much alcohol is in your drink. In the [place] one drink equals one 12 oz bottle of beer (355 mL), one 5 oz glass of wine (148 mL), or one 1 oz glass of hard liquor (44 mL).   Lifestyle  Work with your health care provider to maintain a healthy body weight or to lose weight. Ask what an ideal weight is for you.  Get at least 30 minutes of exercise most days of the week. Activities may include walking, swimming, or biking.  Include exercise to strengthen your muscles (resistance exercise), such as Pilates or lifting weights, as part  of your weekly exercise routine. Try to do these types of exercises for 30 minutes at least 3 days a week.  Do not use any products that contain nicotine or tobacco, such as cigarettes, e-cigarettes, and chewing tobacco. If you need help quitting, ask your health care provider.  Monitor your blood pressure at home as told by your health care provider.  Keep all follow-up visits as told by your health care provider. This is important.   Medicines  Take over-the-counter and prescription medicines only as told by your health care provider. Follow directions carefully. Blood pressure medicines must be taken as prescribed.  Do not skip doses of blood pressure medicine. Doing this puts you at risk for problems and can make the medicine less effective.  Ask your health care provider about side effects or reactions to medicines that you should watch for. Contact a health care provider if you:  Think you are having a reaction to a medicine you are taking.  Have headaches that keep coming back (recurring).  Feel dizzy.  Have swelling in your ankles.  Have trouble with your vision. Get help right away if you:  Develop a severe headache or confusion.  Have unusual weakness or numbness.  Feel faint.  Have severe pain in  your chest or abdomen.  Vomit repeatedly.  Have trouble breathing. Summary  Hypertension is when the force of blood pumping through your arteries is too strong. If this condition is not controlled, it may put you at risk for serious complications.  Your personal target blood pressure may vary depending on your medical conditions, your age, and other factors. For most people, a normal blood pressure is less than 120/80.  Hypertension is treated with lifestyle changes, medicines, or a combination of both. Lifestyle changes include losing weight, eating a healthy, low-sodium diet, exercising more, and limiting alcohol. This information is not intended to replace advice given to you by your health care provider. Make sure you discuss any questions you have with your health care provider. Document Revised: 02/01/2018 Document Reviewed: 02/01/2018 Elsevier Patient Education  2021 [employer].

## 2020-08-21 ENCOUNTER — Ambulatory Visit (INDEPENDENT_AMBULATORY_CARE_PROVIDER_SITE_OTHER): Payer: Medicare HMO | Admitting: Family Medicine

## 2020-08-21 ENCOUNTER — Other Ambulatory Visit: Payer: Self-pay

## 2020-08-21 ENCOUNTER — Encounter: Payer: Self-pay | Admitting: Family Medicine

## 2020-08-21 VITALS — BP 170/80 | HR 61 | Temp 98.0°F | Resp 18 | Ht 64.0 in | Wt 127.6 lb

## 2020-08-21 DIAGNOSIS — I10 Essential (primary) hypertension: Secondary | ICD-10-CM

## 2020-08-21 MED ORDER — AMLODIPINE BESYLATE 2.5 MG PO TABS: 2.5000 mg | ORAL_TABLET | Freq: Every day | ORAL | 2 refills | Status: DC

## 2020-08-21 NOTE — Progress Notes (Signed)
Patient ID: [name], male    DOB: 05-23-1932  Age: 87 y.o. MRN: [id number]    Subjective:  Subjective  HPI [name] presents for f/u bp   He has not taken his meds today----he bp at home are high as well   Review of Systems  Constitutional: Negative for appetite change, diaphoresis, fatigue and unexpected weight change.  Eyes: Negative for pain, redness and visual disturbance.  Respiratory: Negative for cough, chest tightness, shortness of breath and wheezing.   Cardiovascular: Negative for chest pain, palpitations and leg swelling.  Endocrine: Negative for cold intolerance, heat intolerance, polydipsia, polyphagia and polyuria.  Genitourinary: Negative for difficulty urinating, dysuria and frequency.  Neurological: Negative for dizziness, light-headedness, numbness and headaches.    History Past Medical History:  Diagnosis Date  . CAD (coronary artery disease)    LHC 03/25/11 by Dr. Burt Knack:  pLAD 99%, oCFX 20-30%, pOM1 40%, dAVCFX 20-30%.  EF was normal on nuclear study.  He was treated with a Promus DES to his pLAD.   [name] Colon polyps 1996   villous adenoma  . DM type 2 (diabetes mellitus, type 2) ([place]) 2002  . Fuchs' corneal dystrophy   . Hyperlipidemia   . Hypertension   . Macular degeneration    posterior vitreous vitreous detachment  . Prostate cancer [facility]) dx 2018    He has a past surgical history that includes cataract (Bilateral, 2010); Coronary stent placement (03/25/2011); corenea implants (june and sept 2018); Cardiac catheterization (03/25/2011); Radioactive seed implant (N/A, 05/06/2017); and SPACE OAR INSTILLATION (N/A, 05/06/2017).   His family history includes Coronary artery disease (age of onset: 39) in his father; Stomach cancer (age of onset: 30) in his mother.He reports that he quit smoking about 62 years ago. He has a 0.75 pack-year smoking history. He has never used smokeless tobacco. He reports current alcohol use. He reports that he does not use  drugs.  Current Outpatient Medications on File Prior to Visit  Medication Sig Dispense Refill  . Alcohol Swabs PADS Use as directed once a day 100 each 1  . amLODipine (NORVASC) 10 MG tablet Take 1 tablet (10 mg total) by mouth daily. 90 tablet 3  . aspirin 81 MG tablet Take 81 mg by mouth daily.    [name] atorvastatin (LIPITOR) 20 MG tablet Take 1 tablet (20 mg total) by mouth daily. 90 tablet 1  . Blood Glucose Calibration (TRUE METRIX LEVEL 1) Low SOLN USE AS DIRECTED 1 each 3  . Blood Glucose Calibration (TRUE METRIX LEVEL 3) High SOLN Use to check controls on glucometer strips every 30 days or with each new bottle of strips (whichever comes first). 3 each 1  . Blood Glucose Monitoring Suppl (TRUE METRIX METER) w/Device KIT USE AS DIRECTED ONCE A DAY 1 kit 0  . brimonidine-timolol (COMBIGAN) 0.2-0.5 % ophthalmic solution Place 1 drop into both eyes 2 (two) times daily.    . fish oil-omega-3 fatty acids 1000 MG capsule Take 2 g by mouth daily.    [name] glimepiride (AMARYL) 2 MG tablet Take 1 tablet (2 mg total) by mouth daily before breakfast. 90 tablet 1  . glucose blood (TRUE METRIX BLOOD GLUCOSE TEST) test strip USE AS DIRECTED ONCE A DAY 100 strip 3  . losartan-hydrochlorothiazide (HYZAAR) 100-25 MG tablet Take 1 tablet by mouth daily. 90 tablet 1  . metoprolol succinate (TOPROL-XL) 50 MG 24 hr tablet Take 1 tablet (50 mg total) by mouth daily. Take with or immediately following a  meal. 30 tablet 3  . Multiple Vitamin (MULTIVITAMIN) tablet Take 1 tablet by mouth daily.    . SitaGLIPtin-MetFORMIN HCl (JANUMET XR) 50-1000 MG TB24 Take 2 tablets by mouth daily. 180 tablet 1  . tamsulosin (FLOMAX) 0.4 MG CAPS capsule TAKE 1 CAPSULE (0.4 MG TOTAL) BY MOUTH DAILY. 90 capsule 1  . TRUEplus Lancets 30G MISC USE AS DIRECTED ONCE A DAY 100 each 3  . TRUEplus Lancets 33G MISC Use to check blood sugar once a day.  DX  E11.9 100 each 1   No current facility-administered medications on file prior to visit.      Objective:  Objective  Physical Exam Vitals and nursing note reviewed.  Constitutional:      General: He is sleeping.     Appearance: He is well-developed.  HENT:     Head: Normocephalic and atraumatic.  Eyes:     Pupils: Pupils are equal, round, and reactive to light.  Neck:     Thyroid: No thyromegaly.  Cardiovascular:     Rate and Rhythm: Normal rate and regular rhythm.     Heart sounds: No murmur heard.   Pulmonary:     Effort: Pulmonary effort is normal. No respiratory distress.     Breath sounds: Normal breath sounds. No wheezing or rales.  Chest:     Chest wall: No tenderness.  Musculoskeletal:        General: No tenderness.     Cervical back: Normal range of motion and neck supple.  Skin:    General: Skin is warm and dry.  Neurological:     Mental Status: He is oriented to person, place, and time.  Psychiatric:        Behavior: Behavior normal.        Thought Content: Thought content normal.        Judgment: Judgment normal.    BP (!) 170/80 (BP Location: Right Arm, Patient Position: Sitting, Cuff Size: Normal)   Pulse 61   Temp 98 F (36.7 C) (Oral)   Resp 18   Ht 5' 4"  (1.626 m)   Wt 127 lb 9.6 oz (57.9 kg)   SpO2 98%   BMI 21.90 kg/m  Wt Readings from Last 3 Encounters:  08/21/20 127 lb 9.6 oz (57.9 kg)  08/12/20 128 lb 4 oz (58.2 kg)  07/31/20 122 lb (55.3 kg)     Lab Results  Component Value Date   WBC 8.4 07/31/2020   HGB 12.5 (L) 07/31/2020   HCT 37.4 (L) 07/31/2020   PLT 221.0 07/31/2020   GLUCOSE 161 (H) 07/31/2020   CHOL 133 07/31/2020   TRIG 65.0 07/31/2020   HDL 45.60 07/31/2020   LDLCALC 75 07/31/2020   ALT 12 07/31/2020   AST 12 07/31/2020   NA 143 07/31/2020   K 4.3 07/31/2020   CL 105 07/31/2020   CREATININE 1.20 07/31/2020   BUN 27 (H) 07/31/2020   CO2 32 07/31/2020   TSH 2.47 10/20/2009   PSA 9.32 (H) 08/30/2016   INR 1.03 05/02/2017   HGBA1C 7.2 (H) 07/31/2020   MICROALBUR 63.4 (H) 07/31/2020    DG Chest  Port 1 View  Result Date: 07/24/2020 CLINICAL DATA:  Elevated blood pressure. EXAM: PORTABLE CHEST 1 VIEW COMPARISON:  CT scan 03/20/2019 FINDINGS: The cardiac silhouette, mediastinal and hilar contours are within normal limits. Mild tortuosity and calcification of the thoracic aorta. The lungs are clear of an acute process. No pulmonary lesions or pleural effusions. No pneumothorax. The bony thorax  is intact. IMPRESSION: No acute cardiopulmonary findings. Electronically Signed   By: Marijo Sanes M.D.   On: 07/24/2020 11:37     Assessment & Plan:  Plan  I am having [name] start on amLODipine. I am also having him maintain his fish oil-omega-3 fatty acids, aspirin, multivitamin, brimonidine-timolol, Alcohol Swabs, atorvastatin, losartan-hydrochlorothiazide, True Metrix Level 3, TRUEplus Lancets 33G, Janumet XR, tamsulosin, True Metrix Level 1, TRUEplus Lancets 30G, True Metrix Blood Glucose Test, glimepiride, amLODipine, metoprolol succinate, and True Metrix Meter.  Meds ordered this encounter  Medications  . amLODipine (NORVASC) 2.5 MG tablet    Sig: Take 1 tablet (2.5 mg total) by mouth daily.    Dispense:  30 tablet    Refill:  2    Problem List Items Addressed This Visit      Unprioritized   Essential hypertension    Poorly controlled will alter medications, encouraged DASH diet, minimize caffeine and obtain adequate sleep. Report concerning symptoms and follow up as directed and as needed Add novasc 2.5 daily to metoprolol and losartan hct F/u 2-3 weeks       Relevant Medications   amLODipine (NORVASC) 2.5 MG tablet    Other Visit Diagnoses    Primary hypertension    -  Primary   Relevant Medications   amLODipine (NORVASC) 2.5 MG tablet      Follow-up: Return in about 2 weeks (around 09/04/2020), or if symptoms worsen or fail to improve, for bp check ----- needs ov in August as well for f/u .  [name], DO

## 2020-08-21 NOTE — Assessment & Plan Note (Signed)
Poorly controlled will alter medications, encouraged DASH diet, minimize caffeine and obtain adequate sleep. Report concerning symptoms and follow up as directed and as needed Add novasc 2.5 daily to metoprolol and losartan hct F/u 2-3 weeks

## 2020-08-21 NOTE — Patient Instructions (Signed)

## 2020-09-04 ENCOUNTER — Ambulatory Visit (INDEPENDENT_AMBULATORY_CARE_PROVIDER_SITE_OTHER): Payer: Medicare HMO

## 2020-09-04 ENCOUNTER — Other Ambulatory Visit: Payer: Self-pay

## 2020-09-04 DIAGNOSIS — I10 Essential (primary) hypertension: Secondary | ICD-10-CM

## 2020-09-04 NOTE — Progress Notes (Signed)
Pt here for Blood pressure check per   Pt currently takes:   Amlodipine 2.5 MG tablet---Once daily   metoprolol succinate (TOPROL-XL) 50 MG 24 hr  Once daily   Pt reports compliance with medication.  BP today @ = 136/76 HR = 61  Pt advised per Dr. Etter Sjogren, no changes at this time and f/u in 3 months with continued BP monitoring.  Pt scheduled 12/04/20 with Dr. Etter Sjogren.

## 2020-09-10 ENCOUNTER — Ambulatory Visit: Payer: Medicare HMO | Admitting: *Deleted

## 2020-09-11 ENCOUNTER — Ambulatory Visit (INDEPENDENT_AMBULATORY_CARE_PROVIDER_SITE_OTHER): Payer: Medicare HMO

## 2020-09-11 ENCOUNTER — Other Ambulatory Visit: Payer: Self-pay

## 2020-09-11 VITALS — BP 118/70 | HR 65 | Temp 97.6°F | Resp 16 | Ht 64.0 in | Wt 124.4 lb

## 2020-09-11 DIAGNOSIS — Z0000 Encounter for general adult medical examination without abnormal findings: Secondary | ICD-10-CM | POA: Diagnosis not present

## 2020-09-11 NOTE — Patient Instructions (Signed)
[name] , Thank you for taking time to come for your Medicare Wellness Visit. I appreciate your ongoing commitment to your health goals. Please review the following plan we discussed and let me know if I can assist you in the future.   Screening recommendations/referrals: Colonoscopy: No longer required Recommended yearly ophthalmology/optometry visit for glaucoma screening and checkup Recommended yearly dental visit for hygiene and checkup  Vaccinations: Influenza vaccine: Up to date Pneumococcal vaccine: Completed vaccines Tdap vaccine: Discuss with pharmacy Shingles vaccine: Discuss with pharmacy Covid-19: Completed vaccines  Advanced directives: Please bring a copy for your chart  Conditions/risks identified: See problem list  Next appointment: Follow up in one year for your annual wellness visit. 09/17/21 @ 8:20  Preventive Care 65 Years and Older, Male Preventive care refers to lifestyle choices and visits with your health care provider that can promote health and wellness. What does preventive care include?  A yearly physical exam. This is also called an annual well check.  Dental exams once or twice a year.  Routine eye exams. Ask your health care provider how often you should have your eyes checked.  Personal lifestyle choices, including:  Daily care of your teeth and gums.  Regular physical activity.  Eating a healthy diet.  Avoiding tobacco and drug use.  Limiting alcohol use.  Practicing safe sex.  Taking low doses of aspirin every day.  Taking vitamin and mineral supplements as recommended by your health care provider. What happens during an annual well check? The services and screenings done by your health care provider during your annual well check will depend on your age, overall health, lifestyle risk factors, and family history of disease. Counseling  Your health care provider may ask you questions about your:  Alcohol use.  Tobacco  use.  Drug use.  Emotional well-being.  Home and relationship well-being.  Sexual activity.  Eating habits.  History of falls.  Memory and ability to understand (cognition).  Work and work Statistician. Screening  You may have the following tests or measurements:  Height, weight, and BMI.  Blood pressure.  Lipid and cholesterol levels. These may be checked every 5 years, or more frequently if you are over 27 years old.  Skin check.  Lung cancer screening. You may have this screening every year starting at age 36 if you have a 30-pack-year history of smoking and currently smoke or have quit within the past 15 years.  Fecal occult blood test (FOBT) of the stool. You may have this test every year starting at age 70.  Flexible sigmoidoscopy or colonoscopy. You may have a sigmoidoscopy every 5 years or a colonoscopy every 10 years starting at age 53.  Prostate cancer screening. Recommendations will vary depending on your family history and other risks.  Hepatitis C blood test.  Hepatitis B blood test.  Sexually transmitted disease (STD) testing.  Diabetes screening. This is done by checking your blood sugar (glucose) after you have not eaten for a while (fasting). You may have this done every 1-3 years.  Abdominal aortic aneurysm (AAA) screening. You may need this if you are a current or former smoker.  Osteoporosis. You may be screened starting at age 82 if you are at high risk. Talk with your health care provider about your test results, treatment options, and if necessary, the need for more tests. Vaccines  Your health care provider may recommend certain vaccines, such as:  Influenza vaccine. This is recommended every year.  Tetanus, diphtheria, and acellular pertussis (  Tdap, Td) vaccine. You may need a Td booster every 10 years.  Zoster vaccine. You may need this after age 42.  Pneumococcal 13-valent conjugate (PCV13) vaccine. One dose is recommended after age  57.  Pneumococcal polysaccharide (PPSV23) vaccine. One dose is recommended after age 2. Talk to your health care provider about which screenings and vaccines you need and how often you need them. This information is not intended to replace advice given to you by your health care provider. Make sure you discuss any questions you have with your health care provider. Document Released: 06/20/2015 Document Revised: 02/11/2016 Document Reviewed: 03/25/2015 Elsevier Interactive Patient Education  2017 [employer] Prevention in the Home Falls can cause injuries. They can happen to people of all ages. There are many things you can do to make your home safe and to help prevent falls. What can I do on the outside of my home?  Regularly fix the edges of walkways and driveways and fix any cracks.  Remove anything that might make you trip as you walk through a door, such as a raised step or threshold.  Trim any bushes or trees on the path to your home.  Use bright outdoor lighting.  Clear any walking paths of anything that might make someone trip, such as rocks or tools.  Regularly check to see if handrails are loose or broken. Make sure that both sides of any steps have handrails.  Any raised decks and porches should have guardrails on the edges.  Have any leaves, snow, or ice cleared regularly.  Use sand or salt on walking paths during winter.  Clean up any spills in your garage right away. This includes oil or grease spills. What can I do in the bathroom?  Use night lights.  Install grab bars by the toilet and in the tub and shower. Do not use towel bars as grab bars.  Use non-skid mats or decals in the tub or shower.  If you need to sit down in the shower, use a plastic, non-slip stool.  Keep the floor dry. Clean up any water that spills on the floor as soon as it happens.  Remove soap buildup in the tub or shower regularly.  Attach bath mats securely with double-sided  non-slip rug tape.  Do not have throw rugs and other things on the floor that can make you trip. What can I do in the bedroom?  Use night lights.  Make sure that you have a light by your bed that is easy to reach.  Do not use any sheets or blankets that are too big for your bed. They should not hang down onto the floor.  Have a firm chair that has side arms. You can use this for support while you get dressed.  Do not have throw rugs and other things on the floor that can make you trip. What can I do in the kitchen?  Clean up any spills right away.  Avoid walking on wet floors.  Keep items that you use a lot in easy-to-reach places.  If you need to reach something above you, use a strong step stool that has a grab bar.  Keep electrical cords out of the way.  Do not use floor polish or wax that makes floors slippery. If you must use wax, use non-skid floor wax.  Do not have throw rugs and other things on the floor that can make you trip. What can I do with my stairs?  Do  not leave any items on the stairs.  Make sure that there are handrails on both sides of the stairs and use them. Fix handrails that are broken or loose. Make sure that handrails are as long as the stairways.  Check any carpeting to make sure that it is firmly attached to the stairs. Fix any carpet that is loose or worn.  Avoid having throw rugs at the top or bottom of the stairs. If you do have throw rugs, attach them to the floor with carpet tape.  Make sure that you have a light switch at the top of the stairs and the bottom of the stairs. If you do not have them, ask someone to add them for you. What else can I do to help prevent falls?  Wear shoes that:  Do not have high heels.  Have rubber bottoms.  Are comfortable and fit you well.  Are closed at the toe. Do not wear sandals.  If you use a stepladder:  Make sure that it is fully opened. Do not climb a closed stepladder.  Make sure that both  sides of the stepladder are locked into place.  Ask someone to hold it for you, if possible.  Clearly mark and make sure that you can see:  Any grab bars or handrails.  First and last steps.  Where the edge of each step is.  Use tools that help you move around (mobility aids) if they are needed. These include:  Canes.  Walkers.  Scooters.  Crutches.  Turn on the lights when you go into a dark area. Replace any light bulbs as soon as they burn out.  Set up your furniture so you have a clear path. Avoid moving your furniture around.  If any of your floors are uneven, fix them.  If there are any pets around you, be aware of where they are.  Review your medicines with your doctor. Some medicines can make you feel dizzy. This can increase your chance of falling. Ask your doctor what other things that you can do to help prevent falls. This information is not intended to replace advice given to you by your health care provider. Make sure you discuss any questions you have with your health care provider. Document Released: 03/20/2009 Document Revised: 10/30/2015 Document Reviewed: 06/28/2014 Elsevier Interactive Patient Education  2017 [employer].

## 2020-09-11 NOTE — Progress Notes (Signed)
Subjective:   [name] is a 87 y.o. male who presents for Medicare Annual/Subsequent preventive examination.  Review of Systems     Cardiac Risk Factors include: advanced age (>66mn, >>65women);diabetes mellitus;hypertension;dyslipidemia     Objective:    Today's Vitals   09/11/20 0838  BP: 118/70  Pulse: 65  Resp: 16  Temp: 97.6 F (36.4 C)  TempSrc: Temporal  SpO2: 98%  Weight: 124 lb 6.4 oz (56.4 kg)  Height: 5' 4"  (1.626 m)   Body mass index is 21.35 kg/m.  Advanced Directives 09/11/2020 07/24/2020 03/24/2020 09/10/2019 07/30/2019 04/06/2019 03/20/2019  Does Patient Have a Medical Advance Directive? Yes No No Yes Yes Yes No  Type of [place]aramed[place]of [place]ount [place] will - - [place] will [place] will [place] will -  Does patient want to make changes to medical advance directive? - - - No - Patient declined No - Patient declined - -  Copy of [place] Chart? No - copy requested - - No - copy requested - No - copy requested -  Would patient like information on creating a medical advance directive? - No - Patient declined Yes (MAU/Ambulatory/Procedural Areas - Information given) - - - No - Patient declined    Current Medications (verified) Outpatient Encounter Medications as of 09/11/2020  Medication Sig  . Alcohol Swabs PADS Use as directed once a day  . amLODipine (NORVASC) 10 MG tablet Take 1 tablet (10 mg total) by mouth daily.  .Marland KitchenamLODipine (NORVASC) 2.5 MG tablet Take 1 tablet (2.5 mg total) by mouth daily.  .Marland Kitchenaspirin 81 MG tablet Take 81 mg by mouth daily.  .Marland Kitchenatorvastatin (LIPITOR) 20 MG tablet Take 1 tablet (20 mg total) by mouth daily.  . Blood Glucose Calibration (TRUE METRIX LEVEL 1) Low SOLN USE AS DIRECTED  . Blood Glucose Calibration (TRUE METRIX LEVEL 3) High SOLN Use to check controls on glucometer strips every 30 days or with each  new bottle of strips (whichever comes first).  . Blood Glucose Monitoring Suppl (TRUE METRIX METER) w/Device KIT USE AS DIRECTED ONCE A DAY  . brimonidine-timolol (COMBIGAN) 0.2-0.5 % ophthalmic solution Place 1 drop into both eyes 2 (two) times daily.  . fish oil-omega-3 fatty acids 1000 MG capsule Take 2 g by mouth daily.  .Marland Kitchenglimepiride (AMARYL) 2 MG tablet Take 1 tablet (2 mg total) by mouth daily before breakfast.  . glucose blood (TRUE METRIX BLOOD GLUCOSE TEST) test strip USE AS DIRECTED ONCE A DAY  . losartan-hydrochlorothiazide (HYZAAR) 100-25 MG tablet Take 1 tablet by mouth daily.  . metoprolol succinate (TOPROL-XL) 50 MG 24 hr tablet Take 1 tablet (50 mg total) by mouth daily. Take with or immediately following a meal.  . Multiple Vitamin (MULTIVITAMIN) tablet Take 1 tablet by mouth daily.  . SitaGLIPtin-MetFORMIN HCl (JANUMET XR) 50-1000 MG TB24 Take 2 tablets by mouth daily.  . tamsulosin (FLOMAX) 0.4 MG CAPS capsule TAKE 1 CAPSULE (0.4 MG TOTAL) BY MOUTH DAILY.  . TRUEplus Lancets 30G MISC USE AS DIRECTED ONCE A DAY  . TRUEplus Lancets 33G MISC Use to check blood sugar once a day.  DX  E11.9   No facility-administered encounter medications on file as of 09/11/2020.    Allergies (verified) Patient has no known allergies.   History: Past Medical History:  Diagnosis Date  . CAD (coronary artery disease)    [facility] 03/25/11 by Dr. CBurt Knack  pLAD 99%, oCFX 20-30%, pOM1 40%, dAVCFX 20-30%.  EF was normal on nuclear study.  He was treated with a Promus DES to his pLAD.   [name] Colon polyps 1996   villous adenoma  . DM type 2 (diabetes mellitus, type 2) ([place]) 2002  . Fuchs' corneal dystrophy   . Hyperlipidemia   . Hypertension   . Macular degeneration    posterior vitreous vitreous detachment  . Prostate cancer [facility]) dx 2018   Past Surgical History:  Procedure Laterality Date  . CARDIAC CATHETERIZATION  03/25/2011  . cataract Bilateral 2010  . corenea implants  june and sept 2018    [name]  . CORONARY STENT PLACEMENT  03/25/2011  . RADIOACTIVE SEED IMPLANT N/A 05/06/2017   Procedure: RADIOACTIVE SEED IMPLANT/BRACHYTHERAPY IMPLANT;  Surgeon: Kathie Rhodes, MD;  Location: [facility];  Service: Urology;  Laterality: N/A;  . SPACE OAR INSTILLATION N/A 05/06/2017   Procedure: SPACE OAR INSTILLATION;  Surgeon: Kathie Rhodes, MD;  Location: [facility];  Service: Urology;  Laterality: N/A;   Family History  Problem Relation Age of Onset  . Coronary artery disease Father 38       deceased  . Stomach cancer Mother 38   Social History   Socioeconomic History  . Marital status: Widowed    Spouse name: Not on file  . Number of children: Not on file  . Years of education: Not on file  . Highest education level: Not on file  Occupational History  . Occupation: retired  Tobacco Use  . Smoking status: Former Smoker    Packs/day: 0.25    Years: 3.00    Pack years: 0.75    Quit date: 06/07/1958    Years since quitting: 62.3  . Smokeless tobacco: Never Used  Vaping Use  . Vaping Use: Never used  Substance and Sexual Activity  . Alcohol use: Not Currently    Comment: 1 glass wine per day  . Drug use: No  . Sexual activity: Not Currently  Other Topics Concern  . Not on file  Social History Narrative  . Not on file   Social Determinants of Health   Financial Resource Strain: Low Risk   . Difficulty of Paying Living Expenses: Not hard at all  Food Insecurity: No Food Insecurity  . Worried About Charity fundraiser in the Last Year: Never true  . Ran Out of Food in the Last Year: Never true  Transportation Needs: No Transportation Needs  . Lack of Transportation (Medical): No  . Lack of Transportation (Non-Medical): No  Physical Activity: Sufficiently Active  . Days of Exercise per Week: 7 days  . Minutes of Exercise per Session: 30 min  Stress: No Stress Concern Present  . Feeling of Stress : Not at all  Social  Connections: Moderately Isolated  . Frequency of Communication with Friends and Family: More than three times a week  . Frequency of Social Gatherings with Friends and Family: More than three times a week  . Attends Religious Services: More than 4 times per year  . Active Member of Clubs or Organizations: No  . Attends Archivist Meetings: Never  . Marital Status: Widowed    Tobacco Counseling Counseling given: Not Answered   Clinical Intake:  Pre-visit preparation completed: Yes  Pain : No/denies pain     Nutritional Status: BMI of 19-24  Normal Nutritional Risks: None Diabetes: Yes CBG done?: No Did pt. bring in CBG monitor from  home?: No  How often do you need to have someone help you when you read instructions, pamphlets, or other written materials from your doctor or pharmacy?: 1 - Never  Diabetes:  Is the patient diabetic?  Yes  If diabetic, was a CBG obtained today?  No  Did the patient bring in their glucometer from home?  No  How often do you monitor your CBG's? daily.   Financial Strains and Diabetes Management:  Are you having any financial strains with the device, your supplies or your medication? No .  Does the patient want to be seen by Chronic Care Management for management of their diabetes?  No  Would the patient like to be referred to a Nutritionist or for Diabetic Management?  No   Diabetic Exams:  Diabetic Eye Exam: Completed 01/16/2020.   Diabetic Foot Exam: Completed 01/15/2020.  Interpreter Needed?: No  Information entered by :: Caroleen Hamman LPN   Activities of Daily Living In your present state of health, do you have any difficulty performing the following activities: 09/11/2020  Hearing? N  Vision? N  Difficulty concentrating or making decisions? Y  Comment occasionally  Walking or climbing stairs? N  Dressing or bathing? N  Doing errands, shopping? N  Preparing Food and eating ? N  Using the Toilet? N  In the past six  months, have you accidently leaked urine? N  Do you have problems with loss of bowel control? N  Managing your Medications? N  Managing your Finances? N  Housekeeping or managing your Housekeeping? N  Some recent data might be hidden    Patient Care Team: Carollee Herter, Alferd Apa, DO as PCP - General (Family Medicine) Kathie Rhodes, MD (Inactive) as Consulting Physician (Urology) Tyler Pita, MD as Consulting Physician (Radiation Oncology) Marilynne Halsted, MD as Referring Physician (Ophthalmology) Orson Slick, MD as Consulting Physician (Hematology and Oncology) [facility], Melanie Crazier, MD as Consulting Physician (Endocrinology) Robley Fries, MD as Consulting Physician (Urology) Edythe Clarity, [facility] (Pharmacist)  Indicate any recent Medical Services you may have received from other than [facility] providers in the past year (date may be approximate).     Assessment:   This is a routine wellness examination for Tayshon.  Hearing/Vision screen  Hearing Screening   125Hz  250Hz  500Hz  1000Hz  2000Hz  3000Hz  4000Hz  6000Hz  8000Hz   Right ear:           Left ear:           Comments: No issues  Vision Screening Comments: Last eye exam-01/2020-Dr. Bond  Dietary issues and exercise activities discussed: Current Exercise Habits: Home exercise routine, Type of exercise: walking;Other - see comments (jogging), Time (Minutes): 30, Frequency (Times/Week): 7, Weekly Exercise (Minutes/Week): 210, Intensity: Mild, Exercise limited by: None identified  Goals    . Chronic Care Management Pharmacy Care Plan     CARE PLAN ENTRY (see longitudinal plan of care for additional care plan information)  Current Barriers:  . Chronic Disease Management support, education, and care coordination needs related to Diabetes, HTN, Hyperlipidemia/CAD, BPH, CKD   Hypertension BP Readings from Last 3 Encounters:  04/15/20 126/72  02/06/20 (!) 155/57  01/15/20 (!) 152/70   . Pharmacist Clinical  Goal(s): o Over the next 90 days, patient will work with PharmD and providers to achieve BP goal <140/90 . Current regimen:  o Amlodipine 10 mg daily o Losartan/HCTZ 100-25 mg daily  . Interventions: o Requested patient to check his blood pressure 3-4 times per week and record  o Discussed BP goal and importance of monitoring BP o Reinforced importance of adherence . Patient self care activities - Over the next 90 days, patient will: o Check BP 3-4 times per week, document, and provide at future appointments o Ensure daily salt intake < 2300 mg/day  Hyperlipidemia Lab Results  Component Value Date/Time   LDLCALC 87 01/15/2020 09:58 AM   . Pharmacist Clinical Goal(s): o Over the next 90 days, patient will work with PharmD and providers to achieve LDL goal < 70 . Current regimen:  o Atorvastatin 79m daily . Interventions: o Get refill of atorvastatin for patient  o Discussed importance of medication adherence  . Patient self care activities - Over the next 90 days, patient will: o Maintain cholesterol medication regimen.   Diabetes Lab Results  Component Value Date/Time   HGBA1C 6.6 (A) 04/15/2020 08:17 AM   HGBA1C 9.6 (H) 01/15/2020 09:58 AM   HGBA1C 8.8 (H) 10/15/2019 08:30 AM   . Pharmacist Clinical Goal(s): o Over the next 90 days, patient will work with PharmD and providers to maintain A1c goal <7% . Current regimen:  o Glimepiride 250mdaily o Janumet 50-100017mwice daily . Interventions: o Requested patient to check blood sugar daily and record o Collaboration with provider regarding medication management (Janumet titration) o Consider starting Janumet XR 50-1000 once daily for a week then increasing to Janumet XR 50-1000 twice daily o A1c to goal!!! . Patient self care activities - Over the next 90 days, patient will: o Check blood sugar once daily, document, and provide at future appointments o Contact provider with any episodes of hypoglycemia  Osteoporosis  Screening . Pharmacist Clinical Goal(s) o Over the next 90 days, patient will work with PharmD and providers to reduce risk of fracture due to osteopenia/osteoporosis . Current regimen:  o None . Interventions: o Consider daily intake of 1200m31m calcium daily through diet or supplementation o Consider daily intake of 8048283990 units of vitamin D through supplementation  o Consider getting DEXA (bone density scan) for further assessment . Patient self care activities - Over the next 90 days, patient will: o Increase intake to 1200mg23mcalcium daily through diet and/or supplementation o Increase intake to 8048283990 units of vitamin D daily through supplementation o Complete DEXA Scan per Dr. LowneEtter Sjogrenication management . Pharmacist Clinical Goal(s): o Over the next 90 days, patient will work with PharmD and providers to achieve optimal medication adherence . Current pharmacy: [facility] . Interventions o Comprehensive medication review performed. o Continue current medication management strategy . Patient self care activities - Over the next 90 days, patient will: o Focus on medication adherence by filling and taking medications appropriately  o Take medications as prescribed o Report any questions or concerns to PharmD and/or provider(s)  Please see past updates related to this goal by clicking on the "Past Updates" button in the selected goal      . maintain healthy active lifestyle.    . Manage My Medicine     Timeframe:  Long-Range Goal Priority:  High Start Date:   08/15/20                          Expected End Date:   02/15/21                    Follow Up Date 11/15/20   - call for medicine refill 2 or 3 days before it runs out -  keep a list of all the medicines I take; vitamins and herbals too    Why is this important?   . These steps will help you keep on track with your medicines.   Notes:     . Patient Stated     Drink more water      Depression  Screen PHQ 2/9 Scores 09/11/2020 03/24/2020 09/10/2019 04/04/2018 02/16/2017 03/01/2016 09/01/2015  PHQ - 2 Score 0 0 0 0 0 0 0    Fall Risk Fall Risk  09/11/2020 03/24/2020 09/10/2019 04/04/2018 02/16/2017  Falls in the past year? 0 0 1 Yes No  Comment - - dog pulled him down chasing a squirrel - -  Number falls in past yr: 0 - 0 - -  Injury with Fall? 0 - 1 - -  Follow up Falls prevention discussed - Education provided;Falls prevention discussed - -    FALL RISK PREVENTION PERTAINING TO THE HOME:  Any stairs in or around the home? Yes  If so, are there any without handrails? No  Home free of loose throw rugs in walkways, pet beds, electrical cords, etc? Yes  Adequate lighting in your home to reduce risk of falls? Yes   ASSISTIVE DEVICES UTILIZED TO PREVENT FALLS:  Life alert? No  Use of a cane, walker or w/c? No  Grab bars in the bathroom? Yes  Shower chair or bench in shower? No  Elevated toilet seat or a handicapped toilet? No   TIMED UP AND GO:  Was the test performed? Yes .  Length of time to ambulate 10 feet: 9 sec.   Gait steady and fast without use of assistive device  Cognitive Function:Normal cognitive status assessed by direct observation by this Nurse Health Advisor. No abnormalities found.       6CIT Screen 09/11/2020  What Year? 0 points  What month? 0 points  What time? 0 points  Count back from 20 0 points  Months in reverse 2 points  Repeat phrase 2 points  Total Score 4    Immunizations Immunization History  Administered Date(s) Administered  . Fluad Quad(high Dose 65+) 04/06/2019, 04/28/2020  . Influenza Split 03/05/2011, 04/24/2012  . Influenza Whole 02/27/2008, 03/04/2010  . Influenza, High Dose Seasonal PF 04/01/2017, 03/14/2018  . Influenza,inj,quad, With Preservative 03/07/2013  . Influenza-Unspecified 03/08/2015  . PFIZER(Purple Top)SARS-COV-2 Vaccination 08/13/2019, 09/18/2019, 03/21/2020  . Pneumococcal Conjugate-13 08/26/2014  . Pneumococcal  Polysaccharide-23 02/27/2008, 07/31/2020    TDAP status: Due, Education has been provided regarding the importance of this vaccine. Advised may receive this vaccine at local pharmacy or Health Dept. Aware to provide a copy of the vaccination record if obtained from local pharmacy or Health Dept. Verbalized acceptance and understanding.  Flu Vaccine status: Up to date  Pneumococcal vaccine status: Up to date  Covid-19 vaccine status: Completed vaccines  Qualifies for Shingles Vaccine? Yes   Zostavax completed No   Shingrix Completed?: No.    Education has been provided regarding the importance of this vaccine. Patient has been advised to call insurance company to determine out of pocket expense if they have not yet received this vaccine. Advised may also receive vaccine at local pharmacy or Health Dept. Verbalized acceptance and understanding.  Screening Tests Health Maintenance  Topic Date Due  . TETANUS/TDAP  Never done  . COVID-19 Vaccine (4 - Booster for Pfizer series) 09/19/2020  . INFLUENZA VACCINE  01/05/2021  . FOOT EXAM  01/14/2021  . OPHTHALMOLOGY EXAM  01/15/2021  . HEMOGLOBIN A1C  01/28/2021  . PNA vac Low Risk Adult  Completed  . HPV VACCINES  Aged Out    Health Maintenance  Health Maintenance Due  Topic Date Due  . TETANUS/TDAP  Never done    Colorectal cancer screening: No longer required.   Lung Cancer Screening: (Low Dose CT Chest recommended if Age 75-80 years, 30 pack-year currently smoking OR have quit w/in 15years.) does not qualify.    Additional Screening:  Hepatitis C Screening: does not qualify  Vision Screening: Recommended annual ophthalmology exams for early detection of glaucoma and other disorders of the eye. Is the patient up to date with their annual eye exam?  Yes  Who is the provider or what is the name of the office in which the patient attends annual eye exams? Dr. Edilia Bo   Dental Screening: Recommended annual dental exams for proper  oral hygiene  Community Resource Referral / Chronic Care Management: CRR required this visit?  No   CCM required this visit?  No      Plan:     I have personally reviewed and noted the following in the patient's chart:   . Medical and social history . Use of alcohol, tobacco or illicit drugs  . Current medications and supplements . Functional ability and status . Nutritional status . Physical activity . Advanced directives . List of other physicians . Hospitalizations, surgeries, and ER visits in previous 12 months . Vitals . Screenings to include cognitive, depression, and falls . Referrals and appointments  In addition, I have reviewed and discussed with patient certain preventive protocols, quality metrics, and best practice recommendations. A written personalized care plan for preventive services as well as general preventive health recommendations were provided to patient.   Patient would like to access avs on mychart   Marta Antu, [place]   07/09/2977  Nurse Health Advisor  Nurse Notes: None

## 2020-09-22 ENCOUNTER — Other Ambulatory Visit: Payer: Self-pay | Admitting: Internal Medicine

## 2020-10-01 ENCOUNTER — Emergency Department (HOSPITAL_BASED_OUTPATIENT_CLINIC_OR_DEPARTMENT_OTHER): Payer: Medicare HMO

## 2020-10-01 ENCOUNTER — Other Ambulatory Visit: Payer: Self-pay

## 2020-10-01 ENCOUNTER — Emergency Department (HOSPITAL_BASED_OUTPATIENT_CLINIC_OR_DEPARTMENT_OTHER)
Admission: EM | Admit: 2020-10-01 | Discharge: 2020-10-01 | Disposition: A | Discharge status: Home / Self Care | Payer: Medicare HMO | Attending: Emergency Medicine | Admitting: Emergency Medicine

## 2020-10-01 ENCOUNTER — Encounter (HOSPITAL_BASED_OUTPATIENT_CLINIC_OR_DEPARTMENT_OTHER): Payer: Self-pay

## 2020-10-01 DIAGNOSIS — Z8546 Personal history of malignant neoplasm of prostate: Secondary | ICD-10-CM | POA: Diagnosis not present

## 2020-10-01 DIAGNOSIS — Z87891 Personal history of nicotine dependence: Secondary | ICD-10-CM | POA: Insufficient documentation

## 2020-10-01 DIAGNOSIS — E1122 Type 2 diabetes mellitus with diabetic chronic kidney disease: Secondary | ICD-10-CM | POA: Insufficient documentation

## 2020-10-01 DIAGNOSIS — Z79899 Other long term (current) drug therapy: Secondary | ICD-10-CM | POA: Insufficient documentation

## 2020-10-01 DIAGNOSIS — N1832 Chronic kidney disease, stage 3b: Secondary | ICD-10-CM | POA: Diagnosis not present

## 2020-10-01 DIAGNOSIS — M19071 Primary osteoarthritis, right ankle and foot: Secondary | ICD-10-CM | POA: Insufficient documentation

## 2020-10-01 DIAGNOSIS — I2510 Atherosclerotic heart disease of native coronary artery without angina pectoris: Secondary | ICD-10-CM | POA: Diagnosis not present

## 2020-10-01 DIAGNOSIS — Y92007 Garden or yard of unspecified non-institutional (private) residence as the place of occurrence of the external cause: Secondary | ICD-10-CM | POA: Diagnosis not present

## 2020-10-01 DIAGNOSIS — M79671 Pain in right foot: Secondary | ICD-10-CM

## 2020-10-01 DIAGNOSIS — I129 Hypertensive chronic kidney disease with stage 1 through stage 4 chronic kidney disease, or unspecified chronic kidney disease: Secondary | ICD-10-CM | POA: Diagnosis not present

## 2020-10-01 DIAGNOSIS — Z7982 Long term (current) use of aspirin: Secondary | ICD-10-CM | POA: Insufficient documentation

## 2020-10-01 DIAGNOSIS — S99921A Unspecified injury of right foot, initial encounter: Secondary | ICD-10-CM | POA: Diagnosis not present

## 2020-10-01 DIAGNOSIS — Z7984 Long term (current) use of oral hypoglycemic drugs: Secondary | ICD-10-CM | POA: Insufficient documentation

## 2020-10-01 DIAGNOSIS — M1990 Unspecified osteoarthritis, unspecified site: Secondary | ICD-10-CM

## 2020-10-01 DIAGNOSIS — M7731 Calcaneal spur, right foot: Secondary | ICD-10-CM | POA: Diagnosis not present

## 2020-10-01 MED ORDER — HYDROCODONE-ACETAMINOPHEN 5-325 MG PO TABS
1.0000 | ORAL_TABLET | Freq: Once | ORAL | Status: AC
  Administered 2020-10-01: 1 via ORAL
  Filled 2020-10-01: qty 1

## 2020-10-01 MED ORDER — TRAMADOL HCL 50 MG PO TABS: 50.0000 mg | ORAL_TABLET | Freq: Four times a day (QID) | ORAL | 0 refills | Status: DC | PRN

## 2020-10-01 NOTE — ED Notes (Signed)
Patient denies pain and is resting comfortably. Pt noted, "while laying here no pain."

## 2020-10-01 NOTE — Discharge Instructions (Addendum)
Symptoms could be related to gout.  But since she had diabetes we will just treat the pain for now.  Particularly since prednisone will tend to bump your blood sugars up.  Make an appointment to follow-up with your regular doctor.  Return for any new or worse symptoms.  X-ray were negative other than showing signs of arthritis.  No bony abnormalities it is okay to walk on the foot as tolerated.  You can also call sports medicine for follow-up.  Take the tramadol as needed for pain.  Return for any new or worse symptoms.

## 2020-10-01 NOTE — ED Provider Notes (Signed)
[facility] Provider Note   CSN: [id number] Arrival date & time: 10/01/20  2024     History Chief Complaint  Patient presents with  . Foot Injury    [name] is a 87 y.o. male.  Patient with complaint of right foot pain.  Patient thinks he had a bucket kind of leaning on his foot while he was gardening.  Denies any fall or injury.  But he is concerned that may be he has a fracture in his foot.  Patient has diabetes.  Patient does not have a history of gout.  Patient points to the forefoot for the pain laterally        Past Medical History:  Diagnosis Date  . CAD (coronary artery disease)    [facility]HC 03/25/11 by Dr. Burt Knack:  pLAD 99%, oCFX 20-30%, pOM1 40%, dAVCFX 20-30%.  EF was normal on nuclear study.  He was treated with a Promus DES to his pLAD.   [name] Colon polyps 1996   villous adenoma  . DM type 2 (diabetes mellitus, type 2) ([place]) 2002  . Fuchs' corneal dystrophy   . Hyperlipidemia   . Hypertension   . Macular degeneration    posterior vitreous vitreous detachment  . Prostate cancer [facility]) dx 2018    Patient Active Problem List   Diagnosis Date Noted  . Diabetes mellitus ([place]) 04/15/2020  . Type 2 diabetes mellitus with stage 3b chronic kidney disease, without long-term current use of insulin ([place]) 02/06/2020  . Type 2 diabetes mellitus with diabetic polyneuropathy, without long-term current use of insulin ([place]) 02/06/2020  . Uncontrolled type 2 diabetes mellitus with hyperglycemia ([place]) 04/06/2019  . Hyperlipidemia associated with type 2 diabetes mellitus ([place]) 04/06/2019  . Monoclonal gammopathy 04/06/2019  . Osteoporosis with pathological fracture of forearm 03/21/2019  . Multiple closed fractures of ribs of left side 03/21/2019  . Preventative health care 04/01/2017  . Prostate cancer ([place]) 02/15/2017  . BPH with urinary obstruction 12/13/2012  . CAD (coronary artery disease) 04/09/2011  . GASTRITIS 05/25/2010  . COLONIC POLYPS,  ADENOMATOUS, HX OF 04/20/2010  . ANEMIA 10/27/2009  . 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, without long-term current use of insulin ([place]) 07/04/2006  . Hyperlipidemia 07/04/2006  . Essential hypertension 07/04/2006    Past Surgical History:  Procedure Laterality Date  . CARDIAC CATHETERIZATION  03/25/2011  . cataract Bilateral 2010  . corenea implants  june and sept 2018   [name]  . CORONARY STENT PLACEMENT  03/25/2011  . RADIOACTIVE SEED IMPLANT N/A 05/06/2017   Procedure: RADIOACTIVE SEED IMPLANT/BRACHYTHERAPY IMPLANT;  Surgeon: Kathie Rhodes, MD;  Location: [place];  Service: Urology;  Laterality: N/A;  . SPACE OAR INSTILLATION N/A 05/06/2017   Procedure: SPACE OAR INSTILLATION;  Surgeon: Kathie Rhodes, MD;  Location: [facility];  Service: Urology;  Laterality: N/A;       Family History  Problem Relation Age of Onset  . Coronary artery disease Father 22       deceased  . Stomach cancer Mother 74    Social History   Tobacco Use  . Smoking status: Former Smoker    Packs/day: 0.25    Years: 3.00    Pack years: 0.75    Quit date: 06/07/1958    Years since quitting: 62.3  . Smokeless tobacco: Never Used  Vaping Use  . Vaping Use: Never used  Substance Use Topics  . Alcohol use: Not Currently  Comment: 1 glass wine per day  . Drug use: No    Home Medications Prior to Admission medications   Medication Sig Start Date End Date Taking? Authorizing Provider  traMADol (ULTRAM) 50 MG tablet Take 1 tablet (50 mg total) by mouth every 6 (six) hours as needed. 10/01/20  Yes Fredia Sorrow, MD  Alcohol Swabs PADS Use as directed once a day 01/24/20   Carollee Herter, Alferd Apa, DO  amLODipine (NORVASC) 10 MG tablet Take 1 tablet (10 mg total) by mouth daily. 07/31/20   Roma Schanz R, DO  amLODipine (NORVASC) 2.5 MG tablet Take 1 tablet (2.5 mg total) by mouth daily. 08/21/20   [name], DO   aspirin 81 MG tablet Take 81 mg by mouth daily.    [provider]  atorvastatin (LIPITOR) 20 MG tablet Take 1 tablet (20 mg total) by mouth daily. 01/24/20   [name], DO  Blood Glucose Calibration (TRUE METRIX LEVEL 1) Low SOLN USE AS DIRECTED 03/19/20   [name], Melanie Crazier, MD  Blood Glucose Calibration (TRUE METRIX LEVEL 3) High SOLN Use to check controls on glucometer strips every 30 days or with each new bottle of strips (whichever comes first). 01/24/20   Roma Schanz R, DO  Blood Glucose Monitoring Suppl (TRUE METRIX METER) w/Device KIT USE AS DIRECTED ONCE A DAY 08/14/20   [name], Melanie Crazier, MD  brimonidine-timolol (COMBIGAN) 0.2-0.5 % ophthalmic solution Place 1 drop into both eyes 2 (two) times daily.    [provider]  fish oil-omega-3 fatty acids 1000 MG capsule Take 2 g by mouth daily.    [provider]  glimepiride (AMARYL) 2 MG tablet TAKE 1 TABLET EVERY DAY BEFORE BREAKFAST 09/22/20   [name], Melanie Crazier, MD  glucose blood (TRUE METRIX BLOOD GLUCOSE TEST) test strip USE AS DIRECTED ONCE A DAY 03/19/20   [name], Melanie Crazier, MD  losartan-hydrochlorothiazide (HYZAAR) 100-25 MG tablet Take 1 tablet by mouth daily. 01/24/20   [name], DO  metoprolol succinate (TOPROL-XL) 50 MG 24 hr tablet Take 1 tablet (50 mg total) by mouth daily. Take with or immediately following a meal. 07/31/20   Carollee Herter, Alferd Apa, DO  Multiple Vitamin (MULTIVITAMIN) tablet Take 1 tablet by mouth daily.    [provider]  SitaGLIPtin-MetFORMIN HCl (JANUMET XR) 50-1000 MG TB24 Take 2 tablets by mouth daily. 01/24/20   [name], DO  tamsulosin (FLOMAX) 0.4 MG CAPS capsule TAKE 1 CAPSULE (0.4 MG TOTAL) BY MOUTH DAILY. 01/24/20   Roma Schanz R, DO  TRUEplus Lancets 30G MISC USE AS DIRECTED ONCE A DAY 03/19/20   [name], Melanie Crazier, MD  TRUEplus Lancets 33G MISC Use to check blood sugar  once a day.  DX  E11.9 01/24/20   [name], DO    Allergies    Patient has no known allergies.  Review of Systems   Review of Systems  Constitutional: Negative for chills and fever.  HENT: Negative for rhinorrhea and sore throat.   Eyes: Negative for visual disturbance.  Respiratory: Negative for cough and shortness of breath.   Cardiovascular: Negative for chest pain and leg swelling.  Gastrointestinal: Negative for abdominal pain, diarrhea, nausea and vomiting.  Genitourinary: Negative for dysuria.  Musculoskeletal: Positive for joint swelling. Negative for back pain and neck pain.  Skin: Negative for rash.  Neurological: Negative for dizziness, light-headedness and headaches.  Hematological: Does not bruise/bleed easily.  Psychiatric/Behavioral:  Negative for confusion.    Physical Exam Updated Vital Signs BP (!) 177/64 (BP Location: Right Arm)   Pulse 70   Temp 98.4 F (36.9 C) (Oral)   Resp 14   Ht 1.626 m (5' 4" )   Wt 54.9 kg   SpO2 99%   BMI 20.77 kg/m   Physical Exam Vitals and nursing note reviewed.  Constitutional:      Appearance: Normal appearance. He is well-developed.  HENT:     Head: Normocephalic and atraumatic.  Eyes:     Extraocular Movements: Extraocular movements intact.     Conjunctiva/sclera: Conjunctivae normal.     Pupils: Pupils are equal, round, and reactive to light.  Cardiovascular:     Rate and Rhythm: Normal rate and regular rhythm.     Heart sounds: No murmur heard.   Pulmonary:     Effort: Pulmonary effort is normal. No respiratory distress.     Breath sounds: Normal breath sounds.  Abdominal:     Palpations: Abdomen is soft.     Tenderness: There is no abdominal tenderness.  Musculoskeletal:        General: Tenderness present.     Cervical back: Neck supple.     Comments: Left foot and ankle normal.  Right foot with some erythema measuring about 4 cm on the lateral aspect of the forefoot.  And increased warmth.   Distally neurovascularly intact.  No swelling at the ankle no red streaking.  Tenderness to palpation at that area  Skin:    General: Skin is warm and dry.     Capillary Refill: Capillary refill takes less than 2 seconds.  Neurological:     General: No focal deficit present.     Mental Status: He is alert and oriented to person, place, and time.     Cranial Nerves: No cranial nerve deficit.     Sensory: No sensory deficit.     Motor: No weakness.     ED Results / Procedures / Treatments   Labs (all labs ordered are listed, but only abnormal results are displayed) Labs Reviewed - No data to display  EKG None  Radiology DG Foot Complete Right  Result Date: 10/01/2020 CLINICAL DATA:  Injury. EXAM: RIGHT FOOT COMPLETE - 3+ VIEW COMPARISON:  None. FINDINGS: There is no evidence of fracture or dislocation. Minimal osteoarthritis of the first metatarsal phalangeal joint. Slight flattening of the second metatarsal head appears chronic. Moderate plantar calcaneal spur. Soft tissues are unremarkable. IMPRESSION: 1. No acute fracture or subluxation of the right foot. 2. Minimal osteoarthritis of the first metatarsophalangeal joint. 3. Moderate plantar calcaneal spur. Electronically Signed   By: Keith Rake M.D.   On: 10/01/2020 21:15    Procedures Procedures   Medications Ordered in ED Medications  HYDROcodone-acetaminophen (NORCO/VICODIN) 5-325 MG per tablet 1 tablet (1 tablet Oral Given 10/01/20 2221)    ED Course  I have reviewed the triage vital signs and the nursing notes.  Pertinent labs & imaging results that were available during my care of the patient were reviewed by me and considered in my medical decision making (see chart for details).    MDM Rules/Calculators/A&P                         X-ray of the right foot shows no fracture or subluxation.  There is minimal osteoarthritis of the first metatarsal phalangeal joint.  And moderate plantar calcaneal spur.  Think  patient may very well  have an inflammatory arthritis callus a possibility.  But he is a diabetic.  So will not start on prednisone.  We will just treat with pain medication and have him follow-up with sports medicine or his regular doctor.  We will try a cam walker to see if it provides some comfort.     Final Clinical Impression(s) / ED Diagnoses Final diagnoses:  Right foot pain  Inflammatory arthritis    Rx / DC Orders ED Discharge Orders         Ordered    traMADol (ULTRAM) 50 MG tablet  Every 6 hours PRN        10/01/20 2225           Fredia Sorrow, MD 10/01/20 2233

## 2020-10-01 NOTE — ED Triage Notes (Addendum)
Pt states he thinks he sat ~5lb pail on right foot today while gardening-NAD-to triage in w/c holding one crutch

## 2020-10-26 ENCOUNTER — Other Ambulatory Visit: Payer: Self-pay | Admitting: Family Medicine

## 2020-10-26 DIAGNOSIS — I10 Essential (primary) hypertension: Secondary | ICD-10-CM

## 2020-10-27 ENCOUNTER — Other Ambulatory Visit: Payer: Self-pay | Admitting: Hematology and Oncology

## 2020-10-27 ENCOUNTER — Inpatient Hospital Stay: Payer: Medicare HMO

## 2020-10-27 ENCOUNTER — Inpatient Hospital Stay: Payer: Medicare HMO | Attending: Hematology and Oncology | Admitting: Hematology and Oncology

## 2020-10-27 ENCOUNTER — Other Ambulatory Visit: Payer: Self-pay

## 2020-10-27 VITALS — BP 145/83 | HR 57 | Temp 97.2°F | Resp 18 | Ht 64.0 in | Wt 126.3 lb

## 2020-10-27 DIAGNOSIS — I10 Essential (primary) hypertension: Secondary | ICD-10-CM | POA: Diagnosis not present

## 2020-10-27 DIAGNOSIS — D8989 Other specified disorders involving the immune mechanism, not elsewhere classified: Secondary | ICD-10-CM

## 2020-10-27 DIAGNOSIS — D649 Anemia, unspecified: Secondary | ICD-10-CM | POA: Diagnosis not present

## 2020-10-27 DIAGNOSIS — C61 Malignant neoplasm of prostate: Secondary | ICD-10-CM | POA: Insufficient documentation

## 2020-10-27 DIAGNOSIS — D472 Monoclonal gammopathy: Secondary | ICD-10-CM

## 2020-10-27 DIAGNOSIS — C9000 Multiple myeloma not having achieved remission: Secondary | ICD-10-CM | POA: Diagnosis not present

## 2020-10-27 LAB — CBC WITH DIFFERENTIAL (CANCER CENTER ONLY)
Abs Immature Granulocytes: 0.02 10*3/uL (ref 0.00–0.07)
Basophils Absolute: 0.1 10*3/uL (ref 0.0–0.1)
Basophils Relative: 1 %
Eosinophils Absolute: 0.1 10*3/uL (ref 0.0–0.5)
Eosinophils Relative: 1 %
HCT: 37.1 % — ABNORMAL LOW (ref 39.0–52.0)
Hemoglobin: 12.5 g/dL — ABNORMAL LOW (ref 13.0–17.0)
Immature Granulocytes: 0 %
Lymphocytes Relative: 25 %
Lymphs Abs: 2.4 10*3/uL (ref 0.7–4.0)
MCH: 30.1 pg (ref 26.0–34.0)
MCHC: 33.7 g/dL (ref 30.0–36.0)
MCV: 89.4 fL (ref 80.0–100.0)
Monocytes Absolute: 0.8 10*3/uL (ref 0.1–1.0)
Monocytes Relative: 9 %
Neutro Abs: 6 10*3/uL (ref 1.7–7.7)
Neutrophils Relative %: 64 %
Platelet Count: 234 10*3/uL (ref 150–400)
RBC: 4.15 MIL/uL — ABNORMAL LOW (ref 4.22–5.81)
RDW: 13.4 % (ref 11.5–15.5)
WBC Count: 9.4 10*3/uL (ref 4.0–10.5)
nRBC: 0 % (ref 0.0–0.2)

## 2020-10-27 LAB — CMP (CANCER CENTER ONLY)
ALT: 11 U/L (ref 0–44)
AST: 13 U/L — ABNORMAL LOW (ref 15–41)
Albumin: 3.8 g/dL (ref 3.5–5.0)
Alkaline Phosphatase: 79 U/L (ref 38–126)
Anion gap: 8 (ref 5–15)
BUN: 29 mg/dL — ABNORMAL HIGH (ref 8–23)
CO2: 30 mmol/L (ref 22–32)
Calcium: 9.3 mg/dL (ref 8.9–10.3)
Chloride: 104 mmol/L (ref 98–111)
Creatinine: 1.54 mg/dL — ABNORMAL HIGH (ref 0.61–1.24)
GFR, Estimated: 44 mL/min — ABNORMAL LOW (ref >60)
Glucose, Bld: 150 mg/dL — ABNORMAL HIGH (ref 70–99)
Potassium: 3.8 mmol/L (ref 3.5–5.1)
Sodium: 142 mmol/L (ref 135–145)
Total Bilirubin: 0.8 mg/dL (ref 0.3–1.2)
Total Protein: 6.9 g/dL (ref 6.5–8.1)

## 2020-10-27 LAB — LACTATE DEHYDROGENASE: LDH: 163 U/L (ref 98–192)

## 2020-10-27 NOTE — Progress Notes (Signed)
[place] [phone]   [phone]  PROGRESS NOTE  Patient Care Team: Carollee Herter, Alferd Apa, DO as PCP - General (Family Medicine) Kathie Rhodes, MD (Inactive) as Consulting Physician (Urology) Tyler Pita, MD as Consulting Physician (Radiation Oncology) Marilynne Halsted, MD as Referring Physician (Ophthalmology) Orson Slick, MD as Consulting Physician (Hematology and Oncology) [facility], Melanie Crazier, MD as Consulting Physician (Endocrinology) Robley Fries, MD as Consulting Physician (Urology) Edythe Clarity, [facility] (Pharmacist)  Hematological/Oncological History #Smoldering Multiple Myeloma, Intermediate Risk 1) 02/15/19: found to have ambda chains 645.5, kappa 27.9 with a ratio of 0.04, no serum M protein during nephrology work up.  2) 04/06/19: establish care with Dr. Lorenso Courier 3) 04/06/19: SPEP shows no serum monoclonal protein, however high levels of M protein detected in the urine. B2Microglobulin at 3.3. Bone Survey negative.  4)04/23/19: Bone Marrow biopsy performed, confirmed 10-15% clonal plasma cells (9% in aspirate). Confirmed diagnosis of Smoldering multiple myeloma, intermediate risk. WBC 8.6, Hgb 12.4 5) 10/30/2019: WBC 9.2, Hgb 14.6, Cr 1.59, Kappa 29.4, Lambda 597.1, ratio 0.05.   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 Gleason Score of 3+4 1) 05/06/17:Insertion of radioactive I-125 seeds into the prostate glandwith placement of SpaceOAR;145Gy, definitive therapy. Radiation oncologist was Dr. Tammi Klippel at [facility] 2) Subsequently followed by outside urology with serial PSA measurements. Reported last measurement in Oct 2020 at PSA 0.2.   Interval History:  [name] 87 y.o. male with medical history significant for smoldering multiple myeloma and prostate cancer presents for a follow up visit. He was last seen on 04/28/2020 for a routine f/u. In the interim since his last visit [name] has had no changes in  medications, no hospitalizations or emergency room visits.  On exam today [name] reports he has been well in the interim since her last visit.  He reports that he has planted 55 tomato plants and is growing in without pesticides.  He continues to run and exercise and eating quite well 7 nights per week out at restaurants eating lamb, fish, chicken.  He has a he does his best to hydrate and drink plenty of water.  On further discussion he denies any bleeding, bruising, dark stools, shortness of breath, fatigue, lightheadedness, dizziness or new bone or back pain.  A full 10 point ROS is listed below.  MEDICAL HISTORY:  Past Medical History:  Diagnosis Date  . CAD (coronary artery disease)    LHC 03/25/11 by Dr. Burt Knack:  pLAD 99%, oCFX 20-30%, pOM1 40%, dAVCFX 20-30%.  EF was normal on nuclear study.  He was treated with a Promus DES to his pLAD.   [name] Colon polyps 1996   villous adenoma  . DM type 2 (diabetes mellitus, type 2) ([place]) 2002  . Fuchs' corneal dystrophy   . Hyperlipidemia   . Hypertension   . Macular degeneration    posterior vitreous vitreous detachment  . Prostate cancer [facility]) dx 2018    SURGICAL HISTORY: Past Surgical History:  Procedure Laterality Date  . CARDIAC CATHETERIZATION  03/25/2011  . cataract Bilateral 2010  . corenea implants  june and sept 2018   [name]  . CORONARY STENT PLACEMENT  03/25/2011  . RADIOACTIVE SEED IMPLANT N/A 05/06/2017   Procedure: RADIOACTIVE SEED IMPLANT/BRACHYTHERAPY IMPLANT;  Surgeon: Kathie Rhodes, MD;  Location: [place];  Service: Urology;  Laterality: N/A;  . SPACE OAR INSTILLATION N/A 05/06/2017   Procedure: SPACE OAR INSTILLATION;  Surgeon: Kathie Rhodes, MD;  Location:  [place];  Service: Urology;  Laterality: N/A;    SOCIAL HISTORY: Social History   Socioeconomic History  . Marital status: Widowed    Spouse name: Not on file  . Number of children: Not on file  . Years of  education: Not on file  . Highest education level: Not on file  Occupational History  . Occupation: retired  Tobacco Use  . Smoking status: Former Smoker    Packs/day: 0.25    Years: 3.00    Pack years: 0.75    Quit date: 06/07/1958    Years since quitting: 62.4  . Smokeless tobacco: Never Used  Vaping Use  . Vaping Use: Never used  Substance and Sexual Activity  . Alcohol use: Not Currently    Comment: 1 glass wine per day  . Drug use: No  . Sexual activity: Not Currently  Other Topics Concern  . Not on file  Social History Narrative  . Not on file   Social Determinants of Health   Financial Resource Strain: Low Risk   . Difficulty of Paying Living Expenses: Not hard at all  Food Insecurity: No Food Insecurity  . Worried About Charity fundraiser in the Last Year: Never true  . Ran Out of Food in the Last Year: Never true  Transportation Needs: No Transportation Needs  . Lack of Transportation (Medical): No  . Lack of Transportation (Non-Medical): No  Physical Activity: Sufficiently Active  . Days of Exercise per Week: 7 days  . Minutes of Exercise per Session: 30 min  Stress: No Stress Concern Present  . Feeling of Stress : Not at all  Social Connections: Moderately Isolated  . Frequency of Communication with Friends and Family: More than three times a week  . Frequency of Social Gatherings with Friends and Family: More than three times a week  . Attends Religious Services: More than 4 times per year  . Active Member of Clubs or Organizations: No  . Attends Archivist Meetings: Never  . Marital Status: Widowed  Intimate Partner Violence: Not At Risk  . Fear of Current or Ex-Partner: No  . Emotionally Abused: No  . Physically Abused: No  . Sexually Abused: No    FAMILY HISTORY: Family History  Problem Relation Age of Onset  . Coronary artery disease Father 87       deceased  . Stomach cancer Mother 23    ALLERGIES:  has No Known  Allergies.  MEDICATIONS:  Current Outpatient Medications  Medication Sig Dispense Refill  . Alcohol Swabs PADS Use as directed once a day 100 each 1  . amLODipine (NORVASC) 2.5 MG tablet Take 1 tablet (2.5 mg total) by mouth daily. 30 tablet 2  . aspirin 81 MG tablet Take 81 mg by mouth daily.    [name] atorvastatin (LIPITOR) 20 MG tablet Take 1 tablet (20 mg total) by mouth daily. 90 tablet 1  . Blood Glucose Calibration (TRUE METRIX LEVEL 1) Low SOLN USE AS DIRECTED 1 each 3  . Blood Glucose Calibration (TRUE METRIX LEVEL 3) High SOLN Use to check controls on glucometer strips every 30 days or with each new bottle of strips (whichever comes first). 3 each 1  . Blood Glucose Monitoring Suppl (TRUE METRIX METER) w/Device KIT USE AS DIRECTED ONCE A DAY 1 kit 0  . brimonidine-timolol (COMBIGAN) 0.2-0.5 % ophthalmic solution Place 1 drop into both eyes 2 (two) times daily.    . fish oil-omega-3 fatty acids 1000  MG capsule Take 2 g by mouth daily.    [name] glimepiride (AMARYL) 2 MG tablet TAKE 1 TABLET EVERY DAY BEFORE BREAKFAST 90 tablet 1  . glucose blood (TRUE METRIX BLOOD GLUCOSE TEST) test strip USE AS DIRECTED ONCE A DAY 100 strip 3  . losartan-hydrochlorothiazide (HYZAAR) 100-25 MG tablet Take 1 tablet by mouth daily. 90 tablet 1  . metoprolol succinate (TOPROL-XL) 50 MG 24 hr tablet Take 1 tablet (50 mg total) by mouth daily. Take with or immediately following a meal. 30 tablet 3  . Multiple Vitamin (MULTIVITAMIN) tablet Take 1 tablet by mouth daily.    . SitaGLIPtin-MetFORMIN HCl (JANUMET XR) 50-1000 MG TB24 Take 2 tablets by mouth daily. 180 tablet 1  . tamsulosin (FLOMAX) 0.4 MG CAPS capsule TAKE 1 CAPSULE (0.4 MG TOTAL) BY MOUTH DAILY. 90 capsule 1  . traMADol (ULTRAM) 50 MG tablet Take 1 tablet (50 mg total) by mouth every 6 (six) hours as needed. 15 tablet 0  . TRUEplus Lancets 30G MISC USE AS DIRECTED ONCE A DAY 100 each 3  . TRUEplus Lancets 33G MISC Use to check blood sugar once a day.  DX   E11.9 100 each 1   No current facility-administered medications for this visit.    REVIEW OF SYSTEMS:   Constitutional: ( - ) fevers, ( - )  chills , ( - ) night sweats Eyes: ( - ) blurriness of vision, ( - ) double vision, ( - ) watery eyes Ears, nose, mouth, throat, and face: ( - ) mucositis, ( - ) sore throat Respiratory: ( - ) cough, ( - ) dyspnea, ( - ) wheezes Cardiovascular: ( - ) palpitation, ( - ) chest discomfort, ( - ) lower extremity swelling Gastrointestinal:  ( - ) nausea, ( - ) heartburn, ( - ) change in bowel habits Skin: ( - ) abnormal skin rashes Lymphatics: ( - ) new lymphadenopathy, ( - ) easy bruising Neurological: ( - ) numbness, ( - ) tingling, ( - ) new weaknesses Behavioral/Psych: ( - ) mood change, ( - ) new changes  All other systems were reviewed with the patient and are negative.  PHYSICAL EXAMINATION: ECOG PERFORMANCE STATUS: 0 - Asymptomatic  Vitals:   10/27/20 0957  BP: (!) 145/83  Pulse: (!) 57  Resp: 18  Temp: (!) 97.2 F (36.2 C)  SpO2: 99%   Filed Weights   10/27/20 0957  Weight: 126 lb 4.8 oz (57.3 kg)    GENERAL: well appearing elderly Caucasian male, alert, no distress and comfortable SKIN: skin color, texture, turgor are normal, no rashes or significant lesions EYES: conjunctiva are pink and non-injected, sclera clear LUNGS: clear to auscultation and percussion with normal breathing effort HEART: regular rate & rhythm and no murmurs and no lower extremity edema Musculoskeletal: no cyanosis of digits and no clubbing  PSYCH: alert & oriented x 3, fluent speech NEURO: no focal motor/sensory deficits  LABORATORY DATA:  I have reviewed the data as listed  CMP Latest Ref Rng & Units 10/27/2020 07/31/2020 07/24/2020  Glucose 70 - 99 mg/dL 150(H) 161(H) 179(H)  BUN 8 - 23 mg/dL 29(H) 27(H) 15  Creatinine 0.61 - 1.24 mg/dL 1.54(H) 1.20 1.11  Sodium 135 - 145 mmol/L 142 143 140  Potassium 3.5 - 5.1 mmol/L 3.8 4.3 3.9  Chloride 98 - 111  mmol/L 104 105 102  CO2 22 - 32 mmol/L 30 32 29  Calcium 8.9 - 10.3 mg/dL 9.3 9.4 9.2  Total Protein 6.5 -  8.1 g/dL 6.9 6.5 -  Total Bilirubin 0.3 - 1.2 mg/dL 0.8 0.5 -  Alkaline Phos 38 - 126 U/L 79 81 -  AST 15 - 41 U/L 13(L) 12 -  ALT 0 - 44 U/L 11 12 -   CBC Latest Ref Rng & Units 10/27/2020 07/31/2020 07/24/2020  WBC 4.0 - 10.5 K/uL 9.4 8.4 10.0  Hemoglobin 13.0 - 17.0 g/dL 12.5(L) 12.5(L) 13.5  Hematocrit 39.0 - 52.0 % 37.1(L) 37.4(L) 40.6  Platelets 150 - 400 K/uL 234 221.0 248     Surgical Pathology  CASE: [id number]  PATIENT: [name]  Bone Marrow Report   Clinical History: MGUS, left posterior iliac crest, (ADC)   DIAGNOSIS:   BONE MARROW, ASPIRATE, CLOT, CORE:  - Plasma cell myeloma, see comment.  - No amyloid deposits.  - Minimal iron stores.   PERIPHERAL BLOOD:  - Normocytic anemia.   COMMENT:   The marrow is mildly hypercellular with increased monotypic plasma cells  (9% aspirate, 10-15% CD138 immunohistochemistry). There are no amyloid  deposits seen with Congo red, although there is a lack of larger vessels  in the core biopsy. The findings are consistent with plasma cell  myeloma.   MICROSCOPIC DESCRIPTION:   PERIPHERAL BLOOD SMEAR: There is a normocytic anemia with scattered  elliptocytes/ovalocytes. There is no rouleaux formation. Leukocytes  are present in normal numbers. Circulating plasma cells are not  identified. Platelets are present in normal numbers.   BONE MARROW ASPIRATE: Spicular cellular.  Erythroid precursors:Present in appropriate proportions. No  significant dysplasia.  Granulocytic precursors: Present in appropriate proportions. No  significant dysplasia. No increase in blasts.  Megakaryocytes: Present and largely unremarkable.  Lymphocytes/plasma cells: There is a mild increase in plasma cells (9%  by manual differential counts) with scattered atypical forms (large  forms, binucleation). Lymphocytes are not  increased.   TOUCH PREPARATIONS: Similar to aspirate smears.   CLOT AND BIOPSY: The core biopsies and clot sections are mildly  hypercellular for age (38 to 30%). CD138 immunohistochemistry reveals  increased plasma cells (10 to 15%) scattered in small clusters. By  light chain in situ hybridization plasma cells are lambda restricted.  Myeloid and erythroid elements are present in appropriate proportions.  Megakaryocytes exhibit a spectrum of maturation without tight clusters.  There are few benign appearing lymphoid aggregates. Congo red is  negative for amyloid deposits.   IRON STAIN: Iron stains are performed on a bone marrow aspirate or touch  imprint smear and section of clot. The controls stained appropriately.     Storage Iron: Minimal.    Ring Sideroblasts: Absent.   ADDITIONAL DATA/TESTING: Cytogenetics, including FISH for myeloma, was  ordered and will be reported separately.   CELL COUNT DATA:   Bone Marrow count performed on 500 cells shows:  Blasts:  0%  Myeloid: 47%  Promyelocytes: 0%  Erythroid:   23%  Myelocytes:  5%  Lymphocytes:  17%  Metamyelocytes:   0%  Plasma cells: 9%  Bands:  16%  Neutrophils:  22% M:E ratio:   2.04  Eosinophils:  4%  Basophils:   0%  Monocytes:   4%   Lab Data: CBC performed on 04/23/2019 shows:  WBC: 8.6 k/uL Neutrophils:  71%  Hgb: 12.4 g/dL Lymphocytes:  25%  HCT: 38.0 %  Monocytes:   3%  MCV: 92.7 fL  Eosinophils:  0%  RDW: 13.8 %  Basophils:   1%  PLT: 221 k/uL   GROSS DESCRIPTION:   A: Aspirate smear  B: The specimen is received in B-plus fixative and consists of a 21.0 x  12.0 x 5.0 mm aggregate of red-brown clotted blood. The specimen is  entirely submitted in cassette B.   C: The specimen is received in B-plus fixative and consists of 2 cores  of tan bone, measuring 0.6 and 0.9 cm in length by 0.2 cm in diameter.  The specimen is entirely submitted in cassette C. Craig Staggers  04/25/2019)   Final Diagnosis performed by Vicente Males, MD.  Electronically signed  04/25/2019  Technical and / or Professional components performed at [facility], [place] [address] [place], [place] [address].  Immunohistochemistry Technical component (if applicable) was performed  at [facility]. [address] [place],  [place], [place] [address].  IMMUNOHISTOCHEMISTRY DISCLAIMER (if applicable):  Some of these immunohistochemical stains may have been developed and the  performance characteristics determine by [facility]. Some  may not have been cleared or approved by the U.S. Food and Drug  Administration. The FDA has determined that such clearance or approval  is not necessary. This test is used for clinical purposes. It should not  be regarded as investigational or for research. This laboratory is  certified under the [place]  (CLIA-88) as qualified to perform high complexity clinical laboratory  testing. The controls stained appropriately.   RADIOGRAPHIC STUDIES: No relevant radiographic studies.   ASSESSMENT & PLAN TOM MACPHERSON 87 y.o. male with medical history significant for CAD s/p PCI in 2012, DM type II, Prostate cancer s/p brachytherapy, and HTN who presents for follow up for his smoldering multiple myeloma.   On exam today [name] continues to do well.  He continues to be physically active and works outside in his garden on a routine basis.[name]  He continues to be at his baseline level of health with excellent dietary habits and frequent visits to the gym.  He has had no major changes since we last saw him.  Treatment of smoldering multiple myeloma is a developing area of hematology.  There have been clinical trials showing benefit in the treatment of intermediate to high risk SMM (J Clin Oncol. 2020 Apr 10;38(11):1126-1137).  The current treatment recommendation consists of  lenalidomide 25 mg daily for 21 of 28 days.  This therapy is however best suited for younger patients with longer life expectancy is given the long time to progression the average smoldering multiple myeloma patient has.  Review of the literature shows that intermediate risk patients have a time to progression of approximately 5 years (Blood Cancer Journal 8, 59 (2018).  Given [name] advanced age I would recommend close monitoring of his hematological parameters and creatinine/calcium to assure that his disease is not progressing.  I have shared with [name] these statistics and recommendations.   Previously after detailed discussion [name] was in agreement with continued monitoring of his smoldering multiple myeloma.  He acknowledged since he feels well, therefore chemotherapy would likely inhibit his physical activity and his overall wellbeing.  I am in strong agreement that holding on treatment at this time is a reasonable option.  #Smoldering Multiple Myeloma, Intermediate Risk --confirmed diagnosis based on the bone marrow biopsy, with plasma cells of 10-15% and no CRAB criteria. No evidence of amyloidosis on bone marrow stain.  --treatment of Smoldering multiple myeloma is a newer idea and predominately consists of monotherapy lenalidomide. Using the [facility] 2018 20/20/20 guidelines, [name] is a borderline Intermediate  Risk SMM.  --given his advance aged and lack of any CRAB criteria, I would recommend holding on treatment at this time with close clinical monitoring for progression. [name] was in agreement with close continued monitoring.  --will collect SPEP, UPEP, and SFLC on a routine basis -- will order metastatic survey yearly (next due Nov 2022) --RTC in 6 months time.   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 Gleason Score of 3+4 --[name] is s/p definitive radioactive seed placement in Nov 2018 --continue to f/u with outside urology group for routine PSA monitoring, though  if we are following [name] for a monoclonal gammopathy this is something we can monitor as well.  --last PSA collected at [facility], found to be PSA 0.2.   All questions were answered. The patient knows to call the clinic with any problems, questions or concerns.  A total of more than 30 minutes were spent face-to-face with the patient during this encounter and over half of that time was spent on counseling and coordination of care as outlined above.   Ledell Peoples, MD Department of Hematology/Oncology [place] [facility] Phone: [phone] Pager: [phone] Email: [email or web address]  10/27/2020 10:43 AM  Literature Support:  Rene Paci, Ron Parker AM, Buadi FK, Wylie Hail, Matous JV, Anderson DM, Emmons RV, Mahindra A, Wagner LI, Dhodapkar MV, Rajkumar SV. Randomized Trial of Lenalidomide Versus Observation in Smoldering Multiple Myeloma. J Clin Oncol. 2020 Apr 10;38(11):1126-1137. doi: 10.1200/JCO.19.01740. Epub 2019 Oct 25. PMID: 32671245; PMCID: YKD9833825. --Progression-free survival was significantly longer with lenalidomide compared with observation (hazard ratio, 0.28; 95% CI, 0.12 to 0.62; P = .002). One-, 2-, and 3-year progression-free survival was 98%, 93%, and 91% for the lenalidomide arm versus 89%, 76%, and 66% for the observation arm, respectively.  Lakshman, A., Rajkumar, S.V., Buadi, F.K. et al. Risk stratification of smoldering multiple myeloma incorporating revised IMWG diagnostic criteria. Blood Cancer Journal 8, 59 (2018). [email or web address] --The median TTP for low-, intermediate-, and high-risk groups were 110, 68, and 29 months, respectively (p?<?0.0001). BMPC%?>?20%, M-protein?>?2?g/dL, and FLCr?>?20 at diagnosis can be used to risk stratify patients with SMM. Patients with high-risk SMM need close follow-up and are candidates for clinical  trials aiming to prevent progression.

## 2020-10-28 ENCOUNTER — Telehealth: Payer: Self-pay | Admitting: Hematology and Oncology

## 2020-10-28 LAB — KAPPA/LAMBDA LIGHT CHAINS
Kappa free light chain: 31.6 mg/L — ABNORMAL HIGH (ref 3.3–19.4)
Kappa, lambda light chain ratio: 0.05 — ABNORMAL LOW (ref 0.26–1.65)
Lambda free light chains: 585.2 mg/L — ABNORMAL HIGH (ref 5.7–26.3)

## 2020-10-28 NOTE — Telephone Encounter (Signed)
Scheduled per los. Called and left msg. Mailed printout  °

## 2020-10-29 ENCOUNTER — Other Ambulatory Visit: Payer: Self-pay

## 2020-10-29 DIAGNOSIS — C61 Malignant neoplasm of prostate: Secondary | ICD-10-CM | POA: Diagnosis not present

## 2020-10-29 DIAGNOSIS — D649 Anemia, unspecified: Secondary | ICD-10-CM | POA: Diagnosis not present

## 2020-10-29 DIAGNOSIS — I10 Essential (primary) hypertension: Secondary | ICD-10-CM | POA: Diagnosis not present

## 2020-10-29 DIAGNOSIS — C9000 Multiple myeloma not having achieved remission: Secondary | ICD-10-CM | POA: Diagnosis not present

## 2020-10-29 DIAGNOSIS — D472 Monoclonal gammopathy: Secondary | ICD-10-CM

## 2020-10-29 LAB — BETA 2 MICROGLOBULIN, SERUM: Beta-2 Microglobulin: 3.7 mg/L — ABNORMAL HIGH (ref 0.6–2.4)

## 2020-10-30 ENCOUNTER — Other Ambulatory Visit: Payer: Self-pay

## 2020-10-31 LAB — UPEP/UIFE/LIGHT CHAINS/TP, 24-HR UR
% BETA, Urine: 7.3 %
ALPHA 1 URINE: 3.1 %
Albumin, U: 43.7 %
Alpha 2, Urine: 2.4 %
Free Kappa Lt Chains,Ur: 34.38 mg/L (ref 1.17–86.46)
Free Kappa/Lambda Ratio: 0.07 — ABNORMAL LOW (ref 1.83–14.26)
Free Lambda Lt Chains,Ur: 479.4 mg/L — ABNORMAL HIGH (ref 0.27–15.21)
GAMMA GLOBULIN URINE: 43.5 %
M-SPIKE %, Urine: 37.4 % — ABNORMAL HIGH
M-Spike, Mg/24 Hr: 118 mg/24 hr — ABNORMAL HIGH
Total Protein, Urine-Ur/day: 315 mg/24 hr — ABNORMAL HIGH (ref 30–150)
Total Protein, Urine: 24.2 mg/dL
Total Volume: 1300

## 2020-10-31 LAB — MULTIPLE MYELOMA PANEL, SERUM
Albumin SerPl Elph-Mcnc: 4 g/dL (ref 2.9–4.4)
Albumin/Glob SerPl: 1.7 (ref 0.7–1.7)
Alpha 1: 0.2 g/dL (ref 0.0–0.4)
Alpha2 Glob SerPl Elph-Mcnc: 0.7 g/dL (ref 0.4–1.0)
B-Globulin SerPl Elph-Mcnc: 0.8 g/dL (ref 0.7–1.3)
Gamma Glob SerPl Elph-Mcnc: 0.9 g/dL (ref 0.4–1.8)
Globulin, Total: 2.5 g/dL (ref 2.2–3.9)
IgA: 149 mg/dL (ref 61–437)
IgG (Immunoglobin G), Serum: 986 mg/dL (ref 603–1613)
IgM (Immunoglobulin M), Srm: 55 mg/dL (ref 15–143)
Total Protein ELP: 6.5 g/dL (ref 6.0–8.5)

## 2020-11-05 ENCOUNTER — Telehealth: Payer: Self-pay | Admitting: Family Medicine

## 2020-11-05 NOTE — Telephone Encounter (Signed)
Medication: hydrALAZINE (APRESOLINE) injection 10 mg   amLODipine (NORVASC) 2.5 MG tablet  amLODipine (NORVASC) tablet 10 mg    Has the patient contacted their pharmacy? Yes.   (If no, request that the patient contact the pharmacy for the refill.) (If yes, when and what did the pharmacy advise?)  Preferred Pharmacy (with phone number or street name):   [place], [place]: Please be advised that RX refills may take up to 3 business days. We ask that you follow-up with your pharmacy.

## 2020-11-06 ENCOUNTER — Other Ambulatory Visit: Payer: Self-pay

## 2020-11-06 ENCOUNTER — Telehealth: Payer: Self-pay | Admitting: *Deleted

## 2020-11-06 DIAGNOSIS — E1169 Type 2 diabetes mellitus with other specified complication: Secondary | ICD-10-CM

## 2020-11-06 DIAGNOSIS — I10 Essential (primary) hypertension: Secondary | ICD-10-CM

## 2020-11-06 MED ORDER — AMLODIPINE BESYLATE 2.5 MG PO TABS: 2.5000 mg | ORAL_TABLET | Freq: Every day | ORAL | 0 refills | Status: DC

## 2020-11-06 MED ORDER — ATORVASTATIN CALCIUM 20 MG PO TABS: 20.0000 mg | ORAL_TABLET | Freq: Every day | ORAL | 1 refills | Status: DC

## 2020-11-06 NOTE — Telephone Encounter (Signed)
-----  Message from Malikhi T Dorsey IV, MD sent at 11/02/2020 11:46 AM EDT ----- Please let [name] know that his smoldering multiple myeloma labs are stable. There is no need to start treatment at this time. We will see him back in Nov 2022.  ----- Message ----- From: Interface, Lab In [facility] Sent: 10/27/2020   9:34 AM EDT To: Quanta T Dorsey IV, MD   

## 2020-11-06 NOTE — Telephone Encounter (Signed)
Rxs sent

## 2020-11-06 NOTE — Telephone Encounter (Signed)
TCT patient regarding recent lab results. Spoke with him and advised that his labs for smoldering multiple myeloma  are stable, no need to start treatment per Dr.Dorsey.  He is aware of his follow up appt in November 2022.

## 2020-11-10 ENCOUNTER — Other Ambulatory Visit: Payer: Self-pay

## 2020-11-10 DIAGNOSIS — I10 Essential (primary) hypertension: Secondary | ICD-10-CM

## 2020-11-10 DIAGNOSIS — E1165 Type 2 diabetes mellitus with hyperglycemia: Secondary | ICD-10-CM

## 2020-11-10 MED ORDER — TRUEPLUS LANCETS 33G MISC: 1 refills | Status: AC

## 2020-11-10 MED ORDER — TRUE METRIX BLOOD GLUCOSE TEST VI STRP: ORAL_STRIP | 3 refills | Status: DC

## 2020-11-10 MED ORDER — JANUMET XR 50-1000 MG PO TB24: 2.0000 | ORAL_TABLET | Freq: Every day | ORAL | 1 refills | Status: DC

## 2020-11-10 MED ORDER — LOSARTAN POTASSIUM-HCTZ 100-25 MG PO TABS: 1.0000 | ORAL_TABLET | Freq: Every day | ORAL | 1 refills | Status: DC

## 2020-11-10 MED ORDER — ALCOHOL SWABS PADS: MEDICATED_PAD | 1 refills | Status: AC

## 2020-11-11 ENCOUNTER — Telehealth: Payer: Self-pay

## 2020-11-11 NOTE — Chronic Care Management (AMB) (Signed)
Chronic Care Management Pharmacy Assistant   Name: [name]  MRN: [id number] DOB: [birth year]   Reason for Encounter: Disease State Diabetes Mellitus    Recent office visits:  08/21/2020 Roma Schanz PCP- Follow up blood pressure. Start amlodipine 2.5 mg. Return in 2 weeks.   08/15/20 Roma Schanz PCP- no noted available.  07/31/20 Roma Schanz PCP- Annual Exam. Discontinue amlodipine and hydralazine. Start amlodipine and metoprolol succinate. Follow up in 3 weeks. Recent consult visits:  10/27/20 Narda Rutherford, MD(Oncology)- Seen for smoldering myeloma. Return in 6 months.  08/12/20 Ibtehal [name] (Endocrinology)- Diabetes follow up. Follow up in 6 months  08/11/20 Raynelle Fanning (Ophthalmology)- Glaucoma evalutation.   Hospital visits:  Medication Reconciliation was completed by comparing discharge summary, patient's EMR and Pharmacy list, and upon discussion with patient.  Admitted to the hospital on 10/01/2020 due to Foot pain. Discharge date was 10/01/2020. Discharged from [place]?Medications Started at [facility] Discharge:?? -started Tramadol 50 mg due to Foot pain  Medication Changes at Hospital Discharge: -Changed none  Medications Discontinued at Hospital Discharge: -Stopped none  Medications that remain the same after Hospital Discharge:??  -All other medications will remain the same.    Hospital visits:  Medication Reconciliation was completed by comparing discharge summary, patient's EMR and Pharmacy list, and upon discussion with patient.  Admitted to the hospital on 07/24/20 due to Hypertensive urgency. Discharge date was 07/24/20 . Discharged from [place]?Medications Started at [facility] Discharge:?? -started hydralazine due to hypertension  Medication Changes at Hospital Discharge: -Changed none  Medications Discontinued at Hospital Discharge: -Stopped  none  Medications that remain the same after Hospital Discharge:??  -All other medications will remain the same.     Medications: Outpatient Encounter Medications as of 11/11/2020  Medication Sig  . metoprolol succinate (TOPROL-XL) 50 MG 24 hr tablet TAKE 1 TABLET BY MOUTH DAILY. TAKE WITH OR IMMEDIATELY FOLLOWING A MEAL.  [name] Alcohol Swabs PADS Use as directed once a day  . amLODipine (NORVASC) 2.5 MG tablet Take 1 tablet (2.5 mg total) by mouth daily.  [name] aspirin 81 MG tablet Take 81 mg by mouth daily.  [name] atorvastatin (LIPITOR) 20 MG tablet Take 1 tablet (20 mg total) by mouth daily.  . Blood Glucose Calibration (TRUE METRIX LEVEL 1) Low SOLN USE AS DIRECTED  . Blood Glucose Calibration (TRUE METRIX LEVEL 3) High SOLN Use to check controls on glucometer strips every 30 days or with each new bottle of strips (whichever comes first).  . Blood Glucose Monitoring Suppl (TRUE METRIX METER) w/Device KIT USE AS DIRECTED ONCE A DAY  . brimonidine-timolol (COMBIGAN) 0.2-0.5 % ophthalmic solution Place 1 drop into both eyes 2 (two) times daily.  . fish oil-omega-3 fatty acids 1000 MG capsule Take 2 g by mouth daily.  [name] glimepiride (AMARYL) 2 MG tablet TAKE 1 TABLET EVERY DAY BEFORE BREAKFAST  . glucose blood (TRUE METRIX BLOOD GLUCOSE TEST) test strip USE AS DIRECTED ONCE A DAY  . losartan-hydrochlorothiazide (HYZAAR) 100-25 MG tablet Take 1 tablet by mouth daily.  . Multiple Vitamin (MULTIVITAMIN) tablet Take 1 tablet by mouth daily.  . SitaGLIPtin-MetFORMIN HCl (JANUMET XR) 50-1000 MG TB24 Take 2 tablets by mouth daily.  . tamsulosin (FLOMAX) 0.4 MG CAPS capsule TAKE 1 CAPSULE (0.4 MG TOTAL) BY MOUTH DAILY.  . traMADol (ULTRAM) 50 MG tablet Take 1 tablet (50 mg total) by mouth every 6 (six) hours as  needed.  . TRUEplus Lancets 30G MISC USE AS DIRECTED ONCE A DAY  . TRUEplus Lancets 33G MISC Use to check blood sugar once a day.  DX  E11.9   No facility-administered encounter medications on file as  of 11/11/2020.   Recent Relevant Labs: Lab Results  Component Value Date/Time   HGBA1C 7.2 (H) 07/31/2020 09:58 AM   HGBA1C 6.6 (A) 04/15/2020 08:17 AM   HGBA1C 9.6 (H) 01/15/2020 09:58 AM   MICROALBUR 63.4 (H) 07/31/2020 09:58 AM   MICROALBUR 57.1 (H) 01/15/2020 09:58 AM    Kidney Function Lab Results  Component Value Date/Time   CREATININE 1.54 (H) 10/27/2020 09:15 AM   CREATININE 1.20 07/31/2020 09:58 AM   CREATININE 1.11 07/24/2020 11:44 AM   CREATININE 1.52 (H) 04/28/2020 08:48 AM   GFR 54.77 (L) 07/31/2020 09:58 AM   GFRNONAA 44 (L) 10/27/2020 09:15 AM   GFRAA 45 (L) 10/26/2019 08:54 AM    . Current antihyperglycemic regimen:   Glimepiride 2 mg daily   Janumet 50-1000 mg, 2 tabs daily   . What recent interventions/DTPs have been made to improve glycemic control:  o None noted  . Have there been any recent hospitalizations or ED visits since last visit with CPP? Yes   . Patient denies hypoglycemic symptoms, including Pale, Sweaty, Shaky, Hungry, Nervous/irritable and Vision changes   . Patient denies hyperglycemic symptoms, including blurry vision, excessive thirst, fatigue, polyuria and weakness   . How often are you checking your blood sugar? once daily   . What are your blood sugars ranging?  o Fasting: 132 11/05/20 o Before meals:  o After meals: o Bedtime:   . During the week, how often does your blood glucose drop below 70? Rarely   . Are you checking your feet daily/regularly?  o Patient states he checks his feet regularly.  Adherence Review: Is the patient currently on a STATIN medication? Yes Is the patient currently on ACE/ARB medication? Yes Does the patient have >5 day gap between last estimated fill dates? Yes    Star Rating Drugs: Atorvastatin 20 mg last filled 03/03/20 90 DS Hyzaar 100-25 mg last filled 01/25/20 90 DS janumet XR 50-1037m last filled 05/21/20 90 DS Glimepiride 2 mg last filled 09/23/20 90 DS  JRehoboth Mckinley Christian Health Care ServicesClinical Pharmacist Assistant [phone]

## 2020-11-12 NOTE — Telephone Encounter (Cosign Needed)
Per [employer], janumet was last filled 10/31/20 90 DS, Hyzaar 10/31/20 90 DS and atorvastatin was last filled 10/30/20 90 DS.

## 2020-11-15 ENCOUNTER — Other Ambulatory Visit: Payer: Self-pay | Admitting: Family Medicine

## 2020-11-15 DIAGNOSIS — I10 Essential (primary) hypertension: Secondary | ICD-10-CM

## 2020-12-04 ENCOUNTER — Other Ambulatory Visit: Payer: Self-pay

## 2020-12-04 ENCOUNTER — Ambulatory Visit (INDEPENDENT_AMBULATORY_CARE_PROVIDER_SITE_OTHER): Payer: Medicare HMO | Admitting: Family Medicine

## 2020-12-04 ENCOUNTER — Encounter: Payer: Self-pay | Admitting: Family Medicine

## 2020-12-04 VITALS — BP 138/70 | HR 58 | Temp 97.8°F | Resp 18 | Ht 64.0 in | Wt 127.8 lb

## 2020-12-04 DIAGNOSIS — I10 Essential (primary) hypertension: Secondary | ICD-10-CM | POA: Diagnosis not present

## 2020-12-04 DIAGNOSIS — M810 Age-related osteoporosis without current pathological fracture: Secondary | ICD-10-CM

## 2020-12-04 DIAGNOSIS — E785 Hyperlipidemia, unspecified: Secondary | ICD-10-CM | POA: Diagnosis not present

## 2020-12-04 LAB — COMPREHENSIVE METABOLIC PANEL
ALT: 11 U/L (ref 0–53)
AST: 14 U/L (ref 0–37)
Albumin: 4 g/dL (ref 3.5–5.2)
Alkaline Phosphatase: 73 U/L (ref 39–117)
BUN: 28 mg/dL — ABNORMAL HIGH (ref 6–23)
CO2: 31 mEq/L (ref 19–32)
Calcium: 9.4 mg/dL (ref 8.4–10.5)
Chloride: 103 mEq/L (ref 96–112)
Creatinine, Ser: 1.47 mg/dL (ref 0.40–1.50)
GFR: 42.82 mL/min — ABNORMAL LOW (ref >60.00)
Glucose, Bld: 107 mg/dL — ABNORMAL HIGH (ref 70–99)
Potassium: 4.4 mEq/L (ref 3.5–5.1)
Sodium: 141 mEq/L (ref 135–145)
Total Bilirubin: 0.7 mg/dL (ref 0.2–1.2)
Total Protein: 6.4 g/dL (ref 6.0–8.3)

## 2020-12-04 LAB — LIPID PANEL
Cholesterol: 109 mg/dL (ref 0–200)
HDL: 36.8 mg/dL — ABNORMAL LOW (ref >39.00)
LDL Cholesterol: 56 mg/dL (ref 0–99)
NonHDL: 72.68
Total CHOL/HDL Ratio: 3
Triglycerides: 84 mg/dL (ref 0.0–149.0)
VLDL: 16.8 mg/dL (ref 0.0–40.0)

## 2020-12-04 NOTE — Assessment & Plan Note (Signed)
Well controlled, no changes to meds. Encouraged heart healthy diet such as the DASH diet and exercise as tolerated.  °

## 2020-12-04 NOTE — Assessment & Plan Note (Signed)
Encourage heart healthy diet such as MIND or DASH diet, increase exercise, avoid trans fats, simple carbohydrates and processed foods, consider a krill or fish or flaxseed oil cap daily.  °

## 2020-12-04 NOTE — Progress Notes (Signed)
Established Patient Office Visit  Subjective:  Patient ID: [name], male    DOB: August 09, 1931  Age: 87 y.o. MRN: [id number]  CC:  Chief Complaint  Patient presents with   Hypertension   Follow-up    HPI [name] presents for f/u bp and cholesterol.  Pt has no complaints   he also has a hx of osteoporosis but can not remember when he had a bmd last   Past Medical History:  Diagnosis Date   CAD (coronary artery disease)    LHC 03/25/11 by Dr. Burt Knack:  pLAD 99%, oCFX 20-30%, pOM1 40%, dAVCFX 20-30%.  EF was normal on nuclear study.  He was treated with a Promus DES to his pLAD.    Colon polyps 1996   villous adenoma   DM type 2 (diabetes mellitus, type 2) ([place]) 2002   Fuchs' corneal dystrophy    Hyperlipidemia    Hypertension    Macular degeneration    posterior vitreous vitreous detachment   Prostate cancer ([place]) dx 2018    Past Surgical History:  Procedure Laterality Date   CARDIAC CATHETERIZATION  03/25/2011   cataract Bilateral 2010   corenea implants  june and sept 2018   [name]   CORONARY STENT PLACEMENT  03/25/2011   RADIOACTIVE SEED IMPLANT N/A 05/06/2017   Procedure: RADIOACTIVE SEED IMPLANT/BRACHYTHERAPY IMPLANT;  Surgeon: Kathie Rhodes, MD;  Location: [place];  Service: Urology;  Laterality: N/A;   SPACE OAR INSTILLATION N/A 05/06/2017   Procedure: SPACE OAR INSTILLATION;  Surgeon: Kathie Rhodes, MD;  Location: [facility];  Service: Urology;  Laterality: N/A;    Family History  Problem Relation Age of Onset   Coronary artery disease Father 80       deceased   Stomach cancer Mother 33    Social History   Socioeconomic History   Marital status: Widowed    Spouse name: Not on file   Number of children: Not on file   Years of education: Not on file   Highest education level: Not on file  Occupational History   Occupation: retired  Tobacco Use   Smoking status: Former    Packs/day: 0.25    Years:  3.00    Pack years: 0.75    Types: Cigarettes    Quit date: 06/07/1958    Years since quitting: 62.5   Smokeless tobacco: Never  Vaping Use   Vaping Use: Never used  Substance and Sexual Activity   Alcohol use: Not Currently    Comment: 1 glass wine per day   Drug use: No   Sexual activity: Not Currently  Other Topics Concern   Not on file  Social History Narrative   Not on file   Social Determinants of Health   Financial Resource Strain: Low Risk    Difficulty of Paying Living Expenses: Not hard at all  Food Insecurity: No Food Insecurity   Worried About Charity fundraiser in the Last Year: Never true   [place] in the Last Year: Never true  Transportation Needs: No Transportation Needs   Lack of Transportation (Medical): No   Lack of Transportation (Non-Medical): No  Physical Activity: Sufficiently Active   Days of Exercise per Week: 7 days   Minutes of Exercise per Session: 30 min  Stress: No Stress Concern Present   Feeling of Stress : Not at all  Social Connections: Moderately Isolated   Frequency of Communication with Friends and  Family: More than three times a week   Frequency of Social Gatherings with Friends and Family: More than three times a week   Attends Religious Services: More than 4 times per year   Active Member of Clubs or Organizations: No   Attends Archivist Meetings: Never   Marital Status: Widowed  Human resources officer Violence: Not At Risk   Fear of Current or Ex-Partner: No   Emotionally Abused: No   Physically Abused: No   Sexually Abused: No    Outpatient Medications Prior to Visit  Medication Sig Dispense Refill   Alcohol Swabs PADS Use as directed once a day 100 each 1   amLODipine (NORVASC) 2.5 MG tablet Take 1 tablet (2.5 mg total) by mouth daily. 90 tablet 0   aspirin 81 MG tablet Take 81 mg by mouth daily.     atorvastatin (LIPITOR) 20 MG tablet Take 1 tablet (20 mg total) by mouth daily. 90 tablet 1   Blood Glucose  Calibration (TRUE METRIX LEVEL 1) Low SOLN USE AS DIRECTED 1 each 3   Blood Glucose Calibration (TRUE METRIX LEVEL 3) High SOLN Use to check controls on glucometer strips every 30 days or with each new bottle of strips (whichever comes first). 3 each 1   Blood Glucose Monitoring Suppl (TRUE METRIX METER) w/Device KIT USE AS DIRECTED ONCE A DAY 1 kit 0   brimonidine-timolol (COMBIGAN) 0.2-0.5 % ophthalmic solution Place 1 drop into both eyes 2 (two) times daily.     fish oil-omega-3 fatty acids 1000 MG capsule Take 2 g by mouth daily.     glimepiride (AMARYL) 2 MG tablet TAKE 1 TABLET EVERY DAY BEFORE BREAKFAST 90 tablet 1   glucose blood (TRUE METRIX BLOOD GLUCOSE TEST) test strip USE AS DIRECTED ONCE A DAY 100 strip 3   losartan-hydrochlorothiazide (HYZAAR) 100-25 MG tablet Take 1 tablet by mouth daily. 90 tablet 1   metoprolol succinate (TOPROL-XL) 50 MG 24 hr tablet TAKE 1 TABLET BY MOUTH DAILY. TAKE WITH OR IMMEDIATELY FOLLOWING A MEAL. 90 tablet 0   Multiple Vitamin (MULTIVITAMIN) tablet Take 1 tablet by mouth daily.     SitaGLIPtin-MetFORMIN HCl (JANUMET XR) 50-1000 MG TB24 Take 2 tablets by mouth daily. 180 tablet 1   tamsulosin (FLOMAX) 0.4 MG CAPS capsule TAKE 1 CAPSULE (0.4 MG TOTAL) BY MOUTH DAILY. 90 capsule 1   traMADol (ULTRAM) 50 MG tablet Take 1 tablet (50 mg total) by mouth every 6 (six) hours as needed. 15 tablet 0   TRUEplus Lancets 30G MISC USE AS DIRECTED ONCE A DAY 100 each 3   TRUEplus Lancets 33G MISC Use to check blood sugar once a day.  DX  E11.9 100 each 1   No facility-administered medications prior to visit.    No Known Allergies  ROS Review of Systems  Constitutional:  Negative for appetite change, diaphoresis, fatigue and unexpected weight change.  Eyes:  Negative for pain, redness and visual disturbance.  Respiratory:  Negative for cough, chest tightness, shortness of breath and wheezing.   Cardiovascular:  Negative for chest pain, palpitations and leg  swelling.  Endocrine: Negative for cold intolerance, heat intolerance, polydipsia, polyphagia and polyuria.  Genitourinary:  Negative for difficulty urinating, dysuria and frequency.  Neurological:  Negative for dizziness, light-headedness, numbness and headaches.     Objective:    Physical Exam Vitals and nursing note reviewed.  Constitutional:      Appearance: He is well-developed.  HENT:     Head: Normocephalic and  atraumatic.  Eyes:     Pupils: Pupils are equal, round, and reactive to light.  Neck:     Thyroid: No thyromegaly.  Cardiovascular:     Rate and Rhythm: Normal rate and regular rhythm.     Heart sounds: No murmur heard. Pulmonary:     Effort: Pulmonary effort is normal. No respiratory distress.     Breath sounds: Normal breath sounds. No wheezing or rales.  Chest:     Chest wall: No tenderness.  Musculoskeletal:        General: No tenderness.     Cervical back: Normal range of motion and neck supple.  Skin:    General: Skin is warm and dry.  Neurological:     Mental Status: He is alert and oriented to person, place, and time.  Psychiatric:        Behavior: Behavior normal.        Thought Content: Thought content normal.        Judgment: Judgment normal.    BP 138/70 (BP Location: Left Arm, Patient Position: Sitting, Cuff Size: Normal)   Pulse (!) 58   Temp 97.8 F (36.6 C) (Oral)   Resp 18   Ht 5' 4"  (1.626 m)   Wt 127 lb 12.8 oz (58 kg)   SpO2 96%   BMI 21.94 kg/m  Wt Readings from Last 3 Encounters:  12/04/20 127 lb 12.8 oz (58 kg)  10/27/20 126 lb 4.8 oz (57.3 kg)  10/01/20 121 lb (54.9 kg)     Health Maintenance Due  Topic Date Due   TETANUS/TDAP  Never done   Zoster Vaccines- Shingrix (1 of 2) Never done   COVID-19 Vaccine (4 - Booster for Pfizer series) 06/21/2020    There are no preventive care reminders to display for this patient.  Lab Results  Component Value Date   TSH 2.47 10/20/2009   Lab Results  Component Value Date    WBC 9.4 10/27/2020   HGB 12.5 (L) 10/27/2020   HCT 37.1 (L) 10/27/2020   MCV 89.4 10/27/2020   PLT 234 10/27/2020   Lab Results  Component Value Date   NA 142 10/27/2020   K 3.8 10/27/2020   CO2 30 10/27/2020   GLUCOSE 150 (H) 10/27/2020   BUN 29 (H) 10/27/2020   CREATININE 1.54 (H) 10/27/2020   BILITOT 0.8 10/27/2020   ALKPHOS 79 10/27/2020   AST 13 (L) 10/27/2020   ALT 11 10/27/2020   PROT 6.9 10/27/2020   ALBUMIN 3.8 10/27/2020   CALCIUM 9.3 10/27/2020   ANIONGAP 8 10/27/2020   GFR 54.77 (L) 07/31/2020   Lab Results  Component Value Date   CHOL 133 07/31/2020   Lab Results  Component Value Date   HDL 45.60 07/31/2020   Lab Results  Component Value Date   LDLCALC 75 07/31/2020   Lab Results  Component Value Date   TRIG 65.0 07/31/2020   Lab Results  Component Value Date   CHOLHDL 3 07/31/2020   Lab Results  Component Value Date   HGBA1C 7.2 (H) 07/31/2020      Assessment & Plan:   Problem List Items Addressed This Visit       Unprioritized   Essential hypertension    Well controlled, no changes to meds. Encouraged heart healthy diet such as the DASH diet and exercise as tolerated.        Hyperlipidemia    Encourage heart healthy diet such as MIND or DASH diet, increase exercise, avoid trans fats,  simple carbohydrates and processed foods, consider a krill or fish or flaxseed oil cap daily.        Relevant Orders   Lipid panel   Comprehensive metabolic panel   Other Visit Diagnoses     Primary hypertension    -  Primary   Relevant Orders   Lipid panel   Comprehensive metabolic panel   Age-related osteoporosis without current pathological fracture       Relevant Orders   DG Bone Density       No orders of the defined types were placed in this encounter.   Follow-up: Return in about 6 months (around 06/05/2021) for fasting, annual exam.    [name], DO

## 2020-12-04 NOTE — Patient Instructions (Signed)
https://www.nhlbi.nih.gov/files/docs/public/heart/dash_brief.pdf">  DASH Eating Plan DASH stands for Dietary Approaches to Stop Hypertension. The DASH eating plan is a healthy eating plan that has been shown to: Reduce high blood pressure (hypertension). Reduce your risk for type 2 diabetes, heart disease, and stroke. Help with weight loss. What are tips for following this plan? Reading food labels Check food labels for the amount of salt (sodium) per serving. Choose foods with less than 5 percent of the Daily Value of sodium. Generally, foods with less than 300 milligrams (mg) of sodium per serving fit into this eating plan. To find whole grains, look for the word "whole" as the first word in the ingredient list. Shopping Buy products labeled as "low-sodium" or "no salt added." Buy fresh foods. Avoid canned foods and pre-made or frozen meals. Cooking Avoid adding salt when cooking. Use salt-free seasonings or herbs instead of table salt or sea salt. Check with your health care provider or pharmacist before using salt substitutes. Do not fry foods. Cook foods using healthy methods such as baking, boiling, grilling, roasting, and broiling instead. Cook with heart-healthy oils, such as olive, canola, avocado, soybean, or sunflower oil. Meal planning  Eat a balanced diet that includes: 4 or more servings of fruits and 4 or more servings of vegetables each day. Try to fill one-half of your plate with fruits and vegetables. 6-8 servings of whole grains each day. Less than 6 oz (170 g) of lean meat, poultry, or fish each day. A 3-oz (85-g) serving of meat is about the same size as a deck of cards. One egg equals 1 oz (28 g). 2-3 servings of low-fat dairy each day. One serving is 1 cup (237 mL). 1 serving of nuts, seeds, or beans 5 times each week. 2-3 servings of heart-healthy fats. Healthy fats called omega-3 fatty acids are found in foods such as walnuts, flaxseeds, fortified milks, and eggs.  These fats are also found in cold-water fish, such as sardines, salmon, and mackerel. Limit how much you eat of: Canned or prepackaged foods. Food that is high in trans fat, such as some fried foods. Food that is high in saturated fat, such as fatty meat. Desserts and other sweets, sugary drinks, and other foods with added sugar. Full-fat dairy products. Do not salt foods before eating. Do not eat more than 4 egg yolks a week. Try to eat at least 2 vegetarian meals a week. Eat more home-cooked food and less restaurant, buffet, and fast food.  Lifestyle When eating at a restaurant, ask that your food be prepared with less salt or no salt, if possible. If you drink alcohol: Limit how much you use to: 0-1 drink a day for women who are not pregnant. 0-2 drinks a day for men. Be aware of how much alcohol is in your drink. In the [place] one drink equals one 12 oz bottle of beer (355 mL), one 5 oz glass of wine (148 mL), or one 1 oz glass of hard liquor (44 mL). General information Avoid eating more than 2,300 mg of salt a day. If you have hypertension, you may need to reduce your sodium intake to 1,500 mg a day. Work with your health care provider to maintain a healthy body weight or to lose weight. Ask what an ideal weight is for you. Get at least 30 minutes of exercise that causes your heart to beat faster (aerobic exercise) most days of the week. Activities may include walking, swimming, or biking. Work with your health care provider   or dietitian to adjust your eating plan to your individual calorie needs. What foods should I eat? Fruits All fresh, dried, or frozen fruit. Canned fruit in natural juice (without addedsugar). Vegetables Fresh or frozen vegetables (raw, steamed, roasted, or grilled). Low-sodium or reduced-sodium tomato and vegetable juice. Low-sodium or reduced-sodium tomatosauce and tomato paste. Low-sodium or reduced-sodium canned vegetables. Grains Whole-grain or  whole-wheat bread. Whole-grain or whole-wheat pasta. Brown rice. Oatmeal. Quinoa. Bulgur. Whole-grain and low-sodium cereals. Pita bread.Low-fat, low-sodium crackers. Whole-wheat flour tortillas. Meats and other proteins Skinless chicken or turkey. Ground chicken or turkey. Pork with fat trimmed off. Fish and seafood. Egg whites. Dried beans, peas, or lentils. Unsalted nuts, nut butters, and seeds. Unsalted canned beans. Lean cuts of beef with fat trimmed off. Low-sodium, lean precooked or cured meat, such as sausages or meatloaves. Dairy Low-fat (1%) or fat-free (skim) milk. Reduced-fat, low-fat, or fat-free cheeses. Nonfat, low-sodium ricotta or cottage cheese. Low-fat or nonfatyogurt. Low-fat, low-sodium cheese. Fats and oils Soft margarine without trans fats. Vegetable oil. Reduced-fat, low-fat, or light mayonnaise and salad dressings (reduced-sodium). Canola, safflower, olive, avocado, soybean, andsunflower oils. Avocado. Seasonings and condiments Herbs. Spices. Seasoning mixes without salt. Other foods Unsalted popcorn and pretzels. Fat-free sweets. The items listed above may not be a complete list of foods and beverages you can eat. Contact a dietitian for more information. What foods should I avoid? Fruits Canned fruit in a light or heavy syrup. Fried fruit. Fruit in cream or buttersauce. Vegetables Creamed or fried vegetables. Vegetables in a cheese sauce. Regular canned vegetables (not low-sodium or reduced-sodium). Regular canned tomato sauce and paste (not low-sodium or reduced-sodium). Regular tomato and vegetable juice(not low-sodium or reduced-sodium). Pickles. Olives. Grains Baked goods made with fat, such as croissants, muffins, or some breads. Drypasta or rice meal packs. Meats and other proteins Fatty cuts of meat. Ribs. Fried meat. Bacon. Bologna, salami, and other precooked or cured meats, such as sausages or meat loaves. Fat from the back of a pig (fatback). Bratwurst.  Salted nuts and seeds. Canned beans with added salt. Canned orsmoked fish. Whole eggs or egg yolks. Chicken or turkey with skin. Dairy Whole or 2% milk, cream, and half-and-half. Whole or full-fat cream cheese. Whole-fat or sweetened yogurt. Full-fat cheese. Nondairy creamers. Whippedtoppings. Processed cheese and cheese spreads. Fats and oils Butter. Stick margarine. Lard. Shortening. Ghee. Bacon fat. Tropical oils, suchas coconut, palm kernel, or palm oil. Seasonings and condiments Onion salt, garlic salt, seasoned salt, table salt, and sea salt. Worcestershire sauce. Tartar sauce. Barbecue sauce. Teriyaki sauce. Soy sauce, including reduced-sodium. Steak sauce. Canned and packaged gravies. Fish sauce. Oyster sauce. Cocktail sauce. Store-bought horseradish. Ketchup. Mustard. Meat flavorings and tenderizers. Bouillon cubes. Hot sauces. Pre-made or packaged marinades. Pre-made or packaged taco seasonings. Relishes. Regular saladdressings. Other foods Salted popcorn and pretzels. The items listed above may not be a complete list of foods and beverages you should avoid. Contact a dietitian for more information. Where to find more information National Heart, Lung, and Blood Institute: www.nhlbi.nih.gov American Heart Association: www.heart.org Academy of Nutrition and Dietetics: www.eatright.org National Kidney Foundation: www.kidney.org Summary The DASH eating plan is a healthy eating plan that has been shown to reduce high blood pressure (hypertension). It may also reduce your risk for type 2 diabetes, heart disease, and stroke. When on the DASH eating plan, aim to eat more fresh fruits and vegetables, whole grains, lean proteins, low-fat dairy, and heart-healthy fats. With the DASH eating plan, you should limit salt (sodium) intake to 2,300   mg a day. If you have hypertension, you may need to reduce your sodium intake to 1,500 mg a day. Work with your health care provider or dietitian to adjust  your eating plan to your individual calorie needs. This information is not intended to replace advice given to you by your health care provider. Make sure you discuss any questions you have with your healthcare provider. Document Revised: 04/27/2019 Document Reviewed: 04/27/2019 Elsevier Patient Education  2022 [employer]vier Inc.  

## 2020-12-16 ENCOUNTER — Other Ambulatory Visit (HOSPITAL_BASED_OUTPATIENT_CLINIC_OR_DEPARTMENT_OTHER): Payer: Medicare HMO

## 2020-12-18 ENCOUNTER — Ambulatory Visit (HOSPITAL_BASED_OUTPATIENT_CLINIC_OR_DEPARTMENT_OTHER)
Admission: RE | Admit: 2020-12-18 | Discharge: 2020-12-18 | Disposition: A | Discharge status: Home / Self Care | Payer: Medicare HMO | Source: Ambulatory Visit | Attending: Family Medicine | Admitting: Family Medicine

## 2020-12-18 ENCOUNTER — Other Ambulatory Visit: Payer: Self-pay

## 2020-12-18 ENCOUNTER — Ambulatory Visit (INDEPENDENT_AMBULATORY_CARE_PROVIDER_SITE_OTHER): Payer: Medicare HMO | Admitting: Pharmacist

## 2020-12-18 DIAGNOSIS — I10 Essential (primary) hypertension: Secondary | ICD-10-CM

## 2020-12-18 DIAGNOSIS — I2510 Atherosclerotic heart disease of native coronary artery without angina pectoris: Secondary | ICD-10-CM | POA: Diagnosis not present

## 2020-12-18 DIAGNOSIS — E1165 Type 2 diabetes mellitus with hyperglycemia: Secondary | ICD-10-CM

## 2020-12-18 DIAGNOSIS — M810 Age-related osteoporosis without current pathological fracture: Secondary | ICD-10-CM

## 2020-12-18 DIAGNOSIS — E785 Hyperlipidemia, unspecified: Secondary | ICD-10-CM

## 2020-12-18 DIAGNOSIS — R7989 Other specified abnormal findings of blood chemistry: Secondary | ICD-10-CM

## 2020-12-18 DIAGNOSIS — M85831 Other specified disorders of bone density and structure, right forearm: Secondary | ICD-10-CM | POA: Diagnosis not present

## 2020-12-18 NOTE — Patient Instructions (Signed)
Visit Information  PATIENT GOALS:  Goals Addressed             This Visit's Progress    Chronic Care Management Pharmacy Care Plan       CARE PLAN ENTRY (see longitudinal plan of care for additional care plan information)  Current Barriers:  Chronic Disease Management support, education, and care coordination needs related to Diabetes, HTN, Hyperlipidemia/CAD, BPH, CKD   Hypertension BP Readings from Last 3 Encounters:  12/04/20 138/70  10/27/20 (!) 145/83  10/01/20 (!) 177/64  Pharmacist Clinical Goal(s): Over the next 90 days, patient will work with PharmD and providers to achieve BP goal <130/80 Current regimen:  Amlodipine 2.5mg  daily (restarted 08/2020) Losartan/HCTZ 100-25 mg daily  Interventions: Requested patient to check his blood pressure 2 to 3 times per week and record Discussed blood pressure goal and importance of monitoring BP Patient self care activities - Over the next 90 days, patient will: Check blood pressure 2 to 3 times per week, document, and provide at future appointments Ensure daily salt intake < 2300 mg/day Continue current blood pressure medications   Hyperlipidemia Lab Results  Component Value Date/Time   LDLCALC 56 12/04/2020 09:10 AM  Pharmacist Clinical Goal(s): Over the next 90 days, patient will work with PharmD and providers to maintain LDL goal < 70 Current regimen:  Atorvastatin 20mg  daily Interventions: Discussed importance of medication adherence  Patient self care activities - Over the next 90 days, patient will: Maintain cholesterol medication regimen.  Diabetes Lab Results  Component Value Date/Time   HGBA1C 7.2 (H) 07/31/2020 09:58 AM   HGBA1C 6.6 (A) 04/15/2020 08:17 AM   HGBA1C 9.6 (H) 01/15/2020 09:58 AM  Pharmacist Clinical Goal(s): Over the next 90 days, patient will work with PharmD and providers to maintain A1c goal <7% Current regimen:  Glimepiride 2mg  daily Janumet 50-1000mg  twice  daily Interventions: Requested patient to check blood sugar daily and record Collaboration with provider regarding medication management (Janumet titration) Reviewed home blood glucose readings and reviewed goals  Fasting blood glucose goal (before meals) = 80 to 130 Blood glucose goal after a meal = less than 180  Reviewed how to use control solution to check to see that glucometer is accurate   Patient self care activities - Over the next 90 days, patient will: Check blood sugar once daily, document, and provide at future appointments Contact provider with any episodes of hypoglycemia (blood glucose less than 70) Great job with exercise - continue current activity level.  Continue current medications to lower blood glucose  Osteoporosis Screening Pharmacist Clinical Goal(s) Over the next 90 days, patient will work with PharmD and providers to reduce risk of fracture due to osteopenia/osteoporosis Current regimen:  None Interventions: Consider daily intake of 1200mg  of calcium daily through diet or supplementation Consider daily intake of [id number] units of vitamin D through supplementation  Consider getting DEXA (bone density scan) for further assessment Patient self care activities - Over the next 90 days, patient will: Increase intake to 1200mg  of calcium daily through diet and/or supplementation Increase intake to [id number] units of vitamin D daily through supplementation Complete DEXA Scan per Dr. Etter Sjogren  Medication management Pharmacist Clinical Goal(s): Over the next 90 days, patient will work with PharmD and providers to achieve optimal medication adherence Current pharmacy[facility] Interventions Comprehensive medication review performed. Continue current medication management strategy Patient self care activities - Over the next 90 days, patient will: Focus on medication adherence by filling and taking medications appropriately  Take medications as prescribed Report  any questions or concerns to PharmD and/or provider(s)   Please see past updates related to this goal by clicking on the "Past Updates" button in the selected goal          Patient verbalizes understanding of instructions provided today and agrees to view in [place].   Telephone follow up appointment with care management team member scheduled for: 6 months  Cherre Robins, PharmD Clinical Pharmacist [place] [phone]

## 2020-12-18 NOTE — Chronic Care Management (AMB) (Signed)
Chronic Care Management Pharmacy Note  12/18/2020 Name:  Donald QUE MRN:  [name] DOB:  [birth year]  Subjective: [name] is an 87 y.o. year old male who is a primary patient of Donald Held, DO.  The CCM team was consulted for assistance with disease management and care coordination needs.    Engaged with patient by telephone for follow up visit in response to provider referral for pharmacy case management and/or care coordination services.   Consent to Services:  The patient was given information about Chronic Care Management services, agreed to services, and gave verbal consent prior to initiation of services.  Please see initial visit note for detailed documentation.   Patient Care Team: Carollee Herter, Alferd Apa, DO as PCP - General (Family Medicine) Kathie Rhodes, MD (Inactive) as Consulting Physician (Urology) Tyler Pita, MD as Consulting Physician (Radiation Oncology) Marilynne Halsted, MD as Referring Physician (Ophthalmology) Orson Slick, MD as Consulting Physician (Hematology and Oncology) [facility], Melanie Crazier, MD as Consulting Physician (Endocrinology) Robley Fries, MD as Consulting Physician (Urology) Madelon Lips, MD as Consulting Physician (Nephrology) Cherre Robins, PharmD (Pharmacist)  Recent office visits: 12/04/2020 - PCP (Dr Etter Sjogren) F/U BP and cholesterol; Ordered DEXA; no med changes.  08/21/2020 - PCP (Dr Etter Sjogren) BP noted to be above goal. Started amlodipien 2.76m daily.   Recent consult visits: 10/27/2020 - Onc / Heme (Dr DLorenso Courier f/u smoldering myeloma and prostate cancer.  Will continue close monitoring. No med changes.  08/12/2020 - Endo (Dr SKelton Pillar F/U type 2 DM; hypoglycemia reslved with lowering dose of glimepiride to 220mdaily. Continue current glimepiride dose 61m40md and Janumet50/1000mg 2 tablets daily.  Hospital visits: [facility] for fracture or  subluxation. Prescribed tramadol 91m31m prn  Objective:  Lab Results  Component Value Date   CREATININE 1.47 12/04/2020   CREATININE 1.54 (H) 10/27/2020   CREATININE 1.20 07/31/2020    Lab Results  Component Value Date   HGBA1C 7.2 (H) 07/31/2020   Last diabetic Eye exam:  Lab Results  Component Value Date/Time   HMDIABEYEEXA No Retinopathy 08/28/2015 12:00 AM    Last diabetic Foot exam:  Lab Results  Component Value Date/Time   HMDIABFOOTEX done 10/23/2013 12:00 AM        Component Value Date/Time   CHOL 109 12/04/2020 0910   TRIG 84.0 12/04/2020 0910   HDL 36.80 (L) 12/04/2020 0910   CHOLHDL 3 12/04/2020 0910   VLDL 16.8 12/04/2020 0910   LDLCALC 56 12/04/2020 0910    Hepatic Function Latest Ref Rng & Units 12/04/2020 10/27/2020 07/31/2020  Total Protein 6.0 - 8.3 g/dL 6.4 6.9 6.5  Albumin 3.5 - 5.2 g/dL 4.0 3.8 4.1  AST 0 - 37 U/L 14 13(L) 12  ALT 0 - 53 U/L _0 Alk Phosphatase 39 - 117 U/L 73 79 81  Total Bilirubin 0.2 - 1.2 mg/dL 0.7 0.8 0.5  Bilirubin, Direct 0.0 - 0.3 mg/dL - - -    Lab Results  Component Value Date/Time   TSH 2.47 10/20/2009 08:42 AM    CBC Latest Ref Rng & Units 10/27/2020 07/31/2020 07/24/2020  WBC 4.0 - 10.5 K/uL 9.4 8.4 10.0  Hemoglobin 13.0 - 17.0 g/dL 12.5(L) 12.5(L) 13.5  Hematocrit 39.0 - 52.0 % 37.1(L) 37.4(L) 40.6  Platelets 150 - 400 K/uL 234 221.0 248    No results found for: VD25OH  Clinical ASCVD: Yes  The ASCVD Risk score Mikey Bussing DC Jr., et al., 2013) failed to calculate for the following reasons:   The 2013 ASCVD risk score is only valid for ages 68 to 70     Social History   Tobacco Use  Smoking Status Former   Packs/day: 0.25   Years: 3.00   Pack years: 0.75   Types: Cigarettes   Quit date: 06/07/1958   Years since quitting: 62.5  Smokeless Tobacco Never   BP Readings from Last 3 Encounters:  12/04/20 138/70  10/27/20 (!) 145/83  10/01/20 (!) 177/64   Pulse Readings from Last 3 Encounters:   12/04/20 (!) 58  10/27/20 (!) 57  10/01/20 70   Wt Readings from Last 3 Encounters:  12/04/20 127 lb 12.8 oz (58 kg)  10/27/20 126 lb 4.8 oz (57.3 kg)  10/01/20 121 lb (54.9 kg)    Assessment: Review of patient past medical history, allergies, medications, health status, including review of consultants reports, laboratory and other test data, was performed as part of comprehensive evaluation and provision of chronic care management services.   SDOH:  (Social Determinants of Health) assessments and interventions performed:  SDOH Interventions    Flowsheet Row Most Recent Value  SDOH Interventions   Financial Strain Interventions Intervention Not Indicated  Social Connections Interventions Intervention Not Indicated       CCM Care Plan  No Known Allergies  Medications Reviewed Today     Reviewed by Cherre Robins, PharmD (Pharmacist) on 12/18/20 at 1141  Med List Status: <None>   Medication Order Taking? Sig Documenting Provider Last Dose Status Informant  Alcohol Swabs PADS [id number] Yes Use as directed once a day Carollee Herter, Alferd Apa, DO Taking Active   amLODipine (NORVASC) 2.5 MG tablet [id number] Yes Take 1 tablet (2.5 mg total) by mouth daily. Roma Schanz R, DO Taking Active   aspirin 81 MG tablet [id number] Yes Take 81 mg by mouth daily. [provider] Taking Active Self  atorvastatin (LIPITOR) 20 MG tablet [id number] Yes Take 1 tablet (20 mg total) by mouth daily. Roma Schanz R, DO Taking Active   Blood Glucose Calibration (TRUE METRIX LEVEL 1) Low SOLN [id number] Yes USE AS DIRECTED [name], Melanie Crazier, MD Taking Active   Blood Glucose Calibration (TRUE METRIX LEVEL 3) High SOLN [id number] Yes Use to check controls on glucometer strips every 30 days or with each new bottle of strips (whichever comes first). Roma Schanz R, DO Taking Active   Blood Glucose Monitoring Suppl (TRUE METRIX METER) w/Device Drucie Opitz [id number] Yes USE AS DIRECTED  ONCE A DAY [name], Melanie Crazier, MD Taking Active   brimonidine-timolol (COMBIGAN) 0.2-0.5 % ophthalmic solution [id number] Yes Place 1 drop into both eyes 2 (two) times daily. [provider] Taking Active Self  COLLAGEN PO [id number] Yes Take by mouth. 1 scoop daily [provider] Taking Active   fish oil-omega-3 fatty acids 1000 MG capsule [id number] Yes Take 2 g by mouth daily. [provider] Taking Active Self  glimepiride (AMARYL) 2 MG tablet [id number] Yes TAKE 1 TABLET EVERY DAY BEFORE BREAKFAST [name], Melanie Crazier, MD Taking Active   glucose blood (TRUE METRIX BLOOD GLUCOSE TEST) test strip [id number] Yes USE AS DIRECTED ONCE A DAY Carollee Herter, Alferd Apa, DO Taking Active   losartan-hydrochlorothiazide (HYZAAR) 100-25 MG tablet [id number] Yes Take 1 tablet by mouth daily. Roma Schanz R, DO Taking Active   metoprolol succinate (TOPROL-XL) 50 MG 24 hr tablet [id number] Yes TAKE 1 TABLET BY  MOUTH DAILY. TAKE WITH OR IMMEDIATELY FOLLOWING A MEAL. Donald Held, DO Taking Active   Multiple Vitamin (MULTIVITAMIN) tablet [id number] Yes Take 1 tablet by mouth daily. [provider] Taking Active Self  SitaGLIPtin-MetFORMIN HCl (JANUMET XR) 50-1000 MG TB24 [id number] Yes Take 2 tablets by mouth daily. Roma Schanz R, DO Taking Active   tamsulosin (FLOMAX) 0.4 MG CAPS capsule [id number] Yes TAKE 1 CAPSULE (0.4 MG TOTAL) BY MOUTH DAILY. Donald Held, DO Taking Active   TRUEplus Lancets 33G [place] [id number] Yes Use to check blood sugar once a day.  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 chronic kidney disease, without long-term current use of insulin ([place]) 02/06/2020   Type 2 diabetes mellitus with diabetic polyneuropathy, without long-term current use of insulin ([place])  02/06/2020   Uncontrolled type 2 diabetes mellitus with hyperglycemia ([place]) 04/06/2019   Hyperlipidemia associated with type 2 diabetes mellitus ([place]) 04/06/2019   Monoclonal gammopathy 04/06/2019   Osteoporosis with pathological fracture of forearm 03/21/2019   Multiple closed fractures of ribs of left side 03/21/2019   Preventative health care 04/01/2017   Prostate cancer ([place]) 02/15/2017   BPH with urinary obstruction 12/13/2012   CAD (coronary artery disease) 04/09/2011   GASTRITIS 05/25/2010   COLONIC POLYPS, ADENOMATOUS, HX OF 04/20/2010   ANEMIA 10/27/2009   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, without long-term current use of insulin ([place]) 07/04/2006   Hyperlipidemia 07/04/2006   Essential hypertension 07/04/2006    Immunization History  Administered Date(s) Administered   Fluad Quad(high Dose 65+) 04/06/2019, 04/28/2020   Influenza Split 03/05/2011, 04/24/2012   Influenza Whole 02/27/2008, 03/04/2010   Influenza, High Dose Seasonal PF 04/01/2017, 03/14/2018   Influenza,inj,quad, With Preservative 03/07/2013   Influenza-Unspecified 03/08/2015   PFIZER(Purple Top)SARS-COV-2 Vaccination 08/13/2019, 09/18/2019, 03/21/2020   Pneumococcal Conjugate-13 08/26/2014   Pneumococcal Polysaccharide-23 02/27/2008, 07/31/2020    Conditions to be addressed/monitored: CAD, HTN, HLD, DMII, CKD Stage 3b, and BPH, h/o prostate cancer; multiple myeloma; glaucoma  Care Plan : General Pharmacy (Adult)  Updates made by Cherre Robins, PHARMD since 12/18/2020 12:00 AM     Problem: HTN, HLD, DM   Priority: High  Onset Date: 08/15/2020     Long-Range Goal: Patient-Specific Goal   Start Date: 08/15/2020  Expected End Date: 02/15/2021  This Visit's Progress: On track  Recent Progress: On track  Priority: High  Note:   Current Barriers:  No specific barriers identified today. Patient education regarding BG goals and glucometer needed.   Pharmacist Clinical Goal(s):   Over the next 120 days, patient will achieve adherence to monitoring guidelines and medication adherence to achieve therapeutic efficacy maintain control of blood pressure and blood sugar as evidenced by home monitoring  adhere to prescribed medication regimen as evidenced by fill dates through collaboration with PharmD and provider.   Interventions: 1:1 collaboration with Carollee Herter, Alferd Apa, DO regarding development and update of comprehensive plan of care as evidenced by provider attestation and co-signature Inter-disciplinary care team collaboration (see longitudinal plan of care) Comprehensive medication review performed; medication list updated in electronic medical record  Hypertension (BP goal <130/80) Controlled Current treatment: Amlodipine 2.31m daily (restarted 08/2020) Losartan/HCTZ 100-25 mg daily  Metoprolol succinate ER 544mdaily Medications previously tried: hydralazine (d/c post  hospital) Current home readings:  SBP 110 to 132 DBP 70's Current exercise habits: Runs or stationary bike daily; lifts 20lb weight for arm exercise daily and does squats daily; also push mows own lawn and grows 50 organic tomato plants each summer.  Denies hypotensive/hypertensive symptoms; denies edema Interventions:  Educated on BP goals and benefits of medications for prevention of heart attack, stroke and kidney damage; Recommended continue to check blood pressure 2 to 3 times per week, document, and provide log at future appointments Recommended to continue current medication.  Hyperlipidemia / CAD: (LDL goal < 70) Controlled Current treatment: Aspirin 81 mg daily Atorvastatin 20 mg daily Fish Oil 2g daily Medications previously tried: simvastatin (amlodipine interaction) Current exercise habits: see above Interventions: (Addressed at previous visits)  Reviewed cholesterol goals;  Benefits of statin for ASCVD risk reduction;  Recommended to continue current  medication  Diabetes (A1c goal <7%) Managed by endo - Dr Kelton Pillar Last A1c 7.2% - not at goal Current medications: Glimepiride 17m daily  Janumet 50-10066mtwice daily Medications previously tried: glimepiride 36m28maily (hypoglycemia)  Current home glucose readings fasting glucose: 100, 89, 144, 67, 113, 79 Denies hypoglycemic/hyperglycemic symptoms - but noted a low BG last week. Patient reports BG <70 occurs about 1 or 2 times per week.  Patient states he felt that low of 67 might have been error on his meter. Was wondering how to check to make sure meter is working properly Treat hypoglycemia with juice Current exercise: see above Current diet: breakfast is usually barley cereal with blueberries and almond milk. Lunch - sandwich or vegetables; Dinner - eats out but usually salad, vegetable and either chicken, lamb or fish.  Interventions:  Reviewed home blood glucose readings and reviewed goals  Fasting blood glucose goal (before meals) = 80 to 130 Blood glucose goal after a meal = less than 180  Reviewed how to use control solution to check to see that glucometer is accurate Continue to check BG once a day Future options could be to use SLGT2 since patient has CKD 3b Recommended to continue current medication  Great job with exercise - continue current activity level.   CKD - stage 3B: Goal: avoid nephrotoxic medications and slow CKD progression [place] [place] Dr UptHollie Salkast visit 05/2020) Lab Results  Component Value Date   CREATININE 1.47 12/04/2020   CREATININE 1.54 (H) 10/27/2020   CREATININE 1.20 07/31/2020  Interventions:  Reviewed med list for meds that might need adjustment based on Scr / eGFR No change in metformin dose currently but will continue to monitor.  Patient endorsed that he does not take any NSAIDs  Patient Goals/Self-Care Activities Over the next 120 days, patient will:  take medications as prescribed  check glucose daily, document,  and provide at future appointments check blood pressure 2 to 3 times per week, document, and provide at future appointments target a minimum of 150 minutes of moderate intensity exercise weekly  Follow Up Plan: The care management team will reach out to the patient again over the next 120 days.       Medication Assistance: None required.  Patient affirms current coverage meets needs.  [id number]: [phone]: [phone] [place]: [phone]: [phone] [address]  [place] [address], [place] [address] Phone: [phone] Fax: [phone]   Follow Up:  Patient agrees to Care Plan and Follow-up.  Plan: Telephone follow up appointment with care management team member scheduled for:  6 months  Cherre Robins, PharmD Clinical Pharmacist [place] [phone]

## 2020-12-19 ENCOUNTER — Other Ambulatory Visit: Payer: Self-pay

## 2020-12-19 DIAGNOSIS — M810 Age-related osteoporosis without current pathological fracture: Secondary | ICD-10-CM

## 2021-01-10 ENCOUNTER — Other Ambulatory Visit: Payer: Self-pay | Admitting: Family Medicine

## 2021-01-10 DIAGNOSIS — I10 Essential (primary) hypertension: Secondary | ICD-10-CM

## 2021-01-27 NOTE — Progress Notes (Signed)
Office Visit Note  Patient: [name]             Date of Birth: 19-Jul-1931           MRN: [id number]             PCP: [name], DO Referring: [name], * Visit Date: 01/28/2021  Subjective:  New Patient (Initial Visit) (Bone density scan 12/18/2020, resulted in Epic.)   History of Present Illness: [name] is a 87 y.o. male here for osteoporosis. 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  Bone density testing checked in July demonstrated osteoporosis with the lowest BMD in the left femoral neck -2.8.  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  He denies any recent falls.  He tries to focus on a healthy diet focusing on fresh foods and some organic foods.  He takes a daily vitamin D supplement already. He has no history of kidney stones.  He has no history of thyroid disease.  Labs reviewed 10/2020 CMP eGFR 42.82 Ca 9.4   12/18/20 Bone densitometry AP Spine L1-L4 -0.8 1.079 g/cm2 Left femoral neck -2.8 0.644 g/cm2 Right forearm radius 33% -1.7 0.817 g/cm2   Activities of Daily Living:  Patient reports morning stiffness for 0 minutes.   Patient Denies nocturnal pain.  Difficulty dressing/grooming: Denies Difficulty climbing stairs: Denies Difficulty getting out of chair: Denies Difficulty using hands for taps, buttons, cutlery, and/or writing: Denies  Review of Systems  Constitutional:  Negative for fatigue.  HENT:  Negative  for mouth sores, mouth dryness and nose dryness.   Eyes:  Negative for pain, itching, visual disturbance and dryness.  Respiratory:  Negative for cough, hemoptysis, shortness of breath and difficulty breathing.   Cardiovascular:  Negative for chest pain, palpitations and swelling in legs/feet.  Gastrointestinal:  Negative for abdominal pain, blood in stool, constipation and diarrhea.  Endocrine: Negative for increased urination.  Genitourinary:  Negative for painful urination.  Musculoskeletal:  Negative for joint pain, joint pain, joint swelling, myalgias, muscle weakness, morning stiffness, muscle tenderness and myalgias.  Skin:  Negative for color change, rash and redness.  Allergic/Immunologic: Negative for susceptible to infections.  Neurological:  Negative for dizziness, numbness, headaches, memory loss and weakness.  Hematological:  Negative for swollen glands.  Psychiatric/Behavioral:  Negative for confusion and sleep disturbance.    PMFS History:  Patient Active Problem List   Diagnosis Date Noted   Vitamin D deficiency 01/28/2021   Diabetes mellitus ([place]) 04/15/2020   Type 2 diabetes mellitus with stage 3b chronic kidney disease, without long-term current use of insulin ([place]) 02/06/2020   Type 2 diabetes mellitus with diabetic polyneuropathy, without long-term current use of insulin ([place]) 02/06/2020   Uncontrolled type 2 diabetes mellitus with hyperglycemia ([place]) 04/06/2019   Hyperlipidemia associated with type 2 diabetes mellitus ([place]) 04/06/2019   Monoclonal gammopathy 04/06/2019   Osteoporosis with pathological fracture of forearm 03/21/2019   Multiple closed fractures of ribs of left side 03/21/2019   Primary  open angle glaucoma (POAG) of left eye, mild stage 02/04/2019   Primary open angle glaucoma (POAG) of right eye, moderate stage 02/04/2019   Preventative health care 04/01/2017   Prostate cancer ([place]) 02/15/2017   Fuchs' corneal dystrophy 08/18/2016   BPH with  urinary obstruction 12/13/2012   CAD (coronary artery disease) 04/09/2011   GASTRITIS 05/25/2010   COLONIC POLYPS, ADENOMATOUS, HX OF 04/20/2010   ANEMIA 10/27/2009   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, without long-term current use of insulin ([place]) 07/04/2006   Hyperlipidemia 07/04/2006   Essential hypertension 07/04/2006    Past Medical History:  Diagnosis Date   CAD (coronary artery disease)    LHC 03/25/11 by Dr. Burt Knack:  pLAD 99%, oCFX 20-30%, pOM1 40%, dAVCFX 20-30%.  EF was normal on nuclear study.  He was treated with a Promus DES to his pLAD.    Colon polyps 1996   villous adenoma   DM type 2 (diabetes mellitus, type 2) ([place]) 2002   Fuchs' corneal dystrophy    Hyperlipidemia    Hypertension    Macular degeneration    posterior vitreous vitreous detachment   Prostate cancer ([place]) dx 2018    Family History  Problem Relation Age of Onset   Stomach cancer Mother 17   Coronary artery disease Father 43       deceased   Healthy Son    Healthy Son    Healthy Son    Past Surgical History:  Procedure Laterality Date   CARDIAC CATHETERIZATION  03/25/2011   cataract Bilateral 2010   corenea implants  june and sept 2018   [name]   CORONARY STENT PLACEMENT  03/25/2011   RADIOACTIVE SEED IMPLANT N/A 05/06/2017   Procedure: RADIOACTIVE SEED IMPLANT/BRACHYTHERAPY IMPLANT;  Surgeon: Kathie Rhodes, MD;  Location: [facility];  Service: Urology;  Laterality: N/A;   SPACE OAR INSTILLATION N/A 05/06/2017   Procedure: SPACE OAR INSTILLATION;  Surgeon: Kathie Rhodes, MD;  Location[facility];  Service: Urology;  Laterality: N/A;   Social History   Social History Narrative   Not on file   Immunization History  Administered Date(s) Administered   Fluad Quad(high Dose 65+) 04/06/2019, 04/28/2020   Influenza Split 03/05/2011, 04/24/2012   Influenza Whole 02/27/2008, 03/04/2010   Influenza, High Dose Seasonal PF  04/01/2017, 03/14/2018   Influenza,inj,quad, With Preservative 03/07/2013   Influenza-Unspecified 03/08/2015   PFIZER(Purple Top)SARS-COV-2 Vaccination 08/13/2019, 09/18/2019, 03/21/2020   Pneumococcal Conjugate-13 08/26/2014   Pneumococcal Polysaccharide-23 02/27/2008, 07/31/2020     Objective: Vital Signs: BP (!) 195/77 (BP Location: Right Arm, Patient Position: Sitting, Cuff Size: Normal)   Pulse 62   Ht _0  (1.626 m)   Wt 129 lb 6.4 oz (58.7 kg)   BMI 22.21 kg/m    Physical Exam Cardiovascular:     Rate and Rhythm: Normal rate and regular rhythm.  Pulmonary:     Effort: Pulmonary effort is normal.     Breath sounds: Normal breath sounds.  Skin:    General: Skin is warm and dry.     Findings: No rash.  Neurological:     General: No focal deficit present.     Mental Status: He is alert.     Deep Tendon Reflexes: Reflexes normal.  Psychiatric:        Mood and Affect: Mood normal.     Musculoskeletal Exam:  Shoulders full ROM no tenderness or swelling Elbows full ROM no tenderness or swelling Wrists full ROM no  tenderness or swelling Fingers full ROM no tenderness or swelling, bony nodules of the PIP joints bilaterally Knees full ROM no tenderness or swelling   Investigation: No additional findings.  Imaging: No results found.  Recent Labs: Lab Results  Component Value Date   WBC 9.4 10/27/2020   HGB 12.5 (L) 10/27/2020   PLT 234 10/27/2020   NA 141 12/04/2020   K 4.4 12/04/2020   CL 103 12/04/2020   CO2 31 12/04/2020   GLUCOSE 107 (H) 12/04/2020   BUN 28 (H) 12/04/2020   CREATININE 1.47 12/04/2020   BILITOT 0.7 12/04/2020   ALKPHOS 73 12/04/2020   AST 14 12/04/2020   ALT 11 12/04/2020   PROT 6.4 12/04/2020   ALBUMIN 4.0 12/04/2020   CALCIUM 9.4 12/04/2020   GFRAA 45 (L) 10/26/2019    Speciality Comments: No specialty comments available.  Procedures:  No procedures performed Allergies: Patient has no known allergies.   Assessment / Plan:      Visit Diagnoses: Osteoporosis with pathological fracture of left forearm with routine healing, subsequent encounter  Closed fracture of multiple ribs of left side with routine healing, subsequent encounter - Plan: Parathyroid hormone, intact (no Ca), Calcium, Basic Metabolic Panel (BMET)  Osteoporotic BMD plus pathologic fracture with the left wrist ground-level fall 2 years ago would be appropriate for pharmacologic treatment.  His overall fitness especially for age is quite good I do think long-term risk reduction is important.  Recommend probable treatment initially with oral bisphosphonate no obvious contraindications with no complaints of GERD, esophagitis, dysphagia, or strictures.  We will check parathyroid hormone, calcium, and recheck renal function today. He anticipates needing some additional dental crowns placed I discussed risks of medication including atypical femoral fractures and osteonecrosis of the jaw.  Monoclonal gammopathy  Biopsy confirmed smoldering myeloma currently no evidence of complications no evidence suggesting bony lesions from this process but can be contributory beyond normal expected age-related osteoporosis.  Type 2 diabetes mellitus with stage 3b chronic kidney disease, without long-term current use of insulin (H[facility])  His kidney function was previously in CKD 3 range but has been stable.  If anything I suspect this to be the most likely problem for long-term bisphosphonate use will require periodic monitoring.  Vitamin D deficiency - Plan: VITAMIN D 25 Hydroxy (Vit-D Deficiency, Fractures)  Currently on a vitamin D supplement but no recent retesting for adequacy of replacement we will check serum vitamin D today.  Orders: Orders Placed This Encounter  Procedures   VITAMIN D 25 Hydroxy (Vit-D Deficiency, Fractures)   Parathyroid hormone, intact (no Ca)   Calcium   Basic Metabolic Panel (BMET)   No orders of the defined types were placed in this  encounter.    Follow-Up Instructions: Return in about 3 months (around 04/30/2021) for New pt OP plan bisphosphonate f/u 24mo.   CCollier Salina MD  Note - This record has been created using [employer]  Chart creation errors have been sought, but may not always  have been located. Such creation errors do not reflect on  the standard of medical care.

## 2021-01-28 ENCOUNTER — Ambulatory Visit (INDEPENDENT_AMBULATORY_CARE_PROVIDER_SITE_OTHER): Payer: Medicare HMO | Admitting: Internal Medicine

## 2021-01-28 ENCOUNTER — Encounter: Payer: Self-pay | Admitting: Internal Medicine

## 2021-01-28 ENCOUNTER — Other Ambulatory Visit: Payer: Self-pay

## 2021-01-28 VITALS — BP 195/77 | HR 62 | Ht 64.0 in | Wt 129.4 lb

## 2021-01-28 DIAGNOSIS — E559 Vitamin D deficiency, unspecified: Secondary | ICD-10-CM | POA: Diagnosis not present

## 2021-01-28 DIAGNOSIS — S2242XD Multiple fractures of ribs, left side, subsequent encounter for fracture with routine healing: Secondary | ICD-10-CM | POA: Diagnosis not present

## 2021-01-28 DIAGNOSIS — M80032D Age-related osteoporosis with current pathological fracture, left forearm, subsequent encounter for fracture with routine healing: Secondary | ICD-10-CM

## 2021-01-28 DIAGNOSIS — D472 Monoclonal gammopathy: Secondary | ICD-10-CM | POA: Diagnosis not present

## 2021-01-28 DIAGNOSIS — E1122 Type 2 diabetes mellitus with diabetic chronic kidney disease: Secondary | ICD-10-CM | POA: Diagnosis not present

## 2021-01-28 DIAGNOSIS — N1832 Chronic kidney disease, stage 3b: Secondary | ICD-10-CM | POA: Diagnosis not present

## 2021-01-28 HISTORY — DX: Vitamin D deficiency, unspecified: E55.9

## 2021-01-28 NOTE — Patient Instructions (Signed)
Checking labs and plan to start medication for the osteoporosis.  Alendronate Tablets What is this medication? ALENDRONATE (a LEN droe nate) prevents and treats osteoporosis. It may also be used to treat Paget disease of the bone. It works by Paramedic stronger and less likely to break (fracture). It belongs to a group of medicationscalled bisphosphonates. This medicine may be used for other purposes; ask your health care provider orpharmacist if you have questions. COMMON BRAND NAME(S): Fosamax What should I tell my care team before I take this medication? They need to know if you have any of these conditions: Bleeding disorder Cancer Dental disease Difficulty swallowing Infection (fever, chills, cough, sore throat, pain or trouble passing urine) Kidney disease Low levels of calcium or other minerals in the blood Low red blood cell counts Receiving steroids like dexamethasone or prednisone Stomach or intestine problems Trouble sitting or standing for 30 minutes An unusual or allergic reaction to alendronate, other medications, foods, dyes or preservatives Pregnant or trying to get pregnant Breast-feeding How should I use this medication? Take this medication by mouth with a full glass of water. Take it as directed on the prescription label at the same time every day. Take the dose right after waking up. Do not eat or drink anything before taking it. Do not take it with any other drink except water. Do not chew or crush the tablet. After taking it, do not eat breakfast, drink, or take any other medications or vitamins for at least 30 minutes. Sit or stand up for at least 30 minutes after you take it. Donot lie down. Keep taking it unless your care team tells you to stop. A special MedGuide will be given to you by the pharmacist with eachprescription and refill. Be sure to read this information carefully each time. Talk to your care team about the use of this medication in children.  Specialcare may be needed. Overdosage: If you think you have taken too much of this medicine contact apoison control center or emergency room at once. NOTE: This medicine is only for you. Do not share this medicine with others. What if I miss a dose? If you take your medication once a day, skip it. Take your next dose at thescheduled time the next morning. Do not take two doses on the same day. If you take your medication once a week, take the missed dose on the morningafter you remember. Do not take two doses on the same day. What may interact with this medication? Aluminum hydroxide Antacids Aspirin Calcium supplements Medications for inflammation like ibuprofen, naproxen, and others Iron supplements Magnesium supplements Vitamins with minerals This list may not describe all possible interactions. Give your health care provider a list of all the medicines, herbs, non-prescription drugs, or dietary supplements you use. Also tell them if you smoke, drink alcohol, or use illegaldrugs. Some items may interact with your medicine. What should I watch for while using this medication? Visit your care team for regular checks on your progress. It may be some timebefore you see the benefit from this medication. Some people who take this medication have severe bone, joint, or muscle pain. This medication may also increase your risk for jaw problems or a broken thigh bone. Tell your care team right away if you have severe pain in your jaw, bones, joints, or muscles. Tell you care team if you have any pain that doesnot go away or that gets worse. Tell your dentist and dental surgeon that you are  taking this medication. You should not have major dental surgery while on this medication. See your dentist to have a dental exam and fix any dental problems before starting this medication. Take good care of your teeth while on this medication. Make sureyou see your dentist for regular follow-up appointments. You  should make sure you get enough calcium and vitamin D while you are taking this medication. Discuss the foods you eat and the vitamins you take with yourcare team. You may need blood work done while you are taking this medication. What side effects may I notice from receiving this medication? Side effects that you should report to your care team as soon as possible: Allergic reactions-skin rash, itching, hives, swelling of the face, lips, tongue, or throat Low calcium level-muscle pain or cramps, confusion, tingling, or numbness in the hands or feet Osteonecrosis of the jaw-pain, swelling, or redness in the mouth, numbness of the jaw, poor healing after dental work, unusual discharge from the mouth, visible bones in the mouth Pain or trouble swallowing Severe bone, joint, or muscle pain Stomach bleeding-bloody or black, tar-like stools, vomiting blood or brown material that looks like coffee grounds Side effects that usually do not require medical attention (report to your careteam if they continue or are bothersome): Constipation Diarrhea Nausea Stomach pain This list may not describe all possible side effects. Call your doctor for medical advice about side effects. You may report side effects to FDA at1-800-FDA-1088. Where should I keep my medication? Keep out of the reach of children and pets. Store at room temperature between 15 and 30 degrees C (59 and 86 degrees F).Throw away any unused medication after the expiration date.  Parathyroid Hormone Test Why am I having this test? The parathyroid hormone (PTH) test may be used to evaluate the function of your parathyroid glands and to check for or monitor conditions that affect the calcium levels in your body. The parathyroid glands are four tiny glands that surround the larger thyroid gland in your neck. When your blood calcium levels are low, your parathyroid glands release PTH into the blood. This causes several changes in your body that  result in an increase in your blood calcium level. When blood calcium levels are normal or too high, blood PTH levelsdecrease. Your health care provider may want you to have this test if you have: Signs of higher-than-normal blood calcium levels (hypercalcemia). Signs of lower-than-normal blood calcium levels (hypocalcemia). A kidney infection. Kidney disease. Certain cancer cells that secrete PTH. What is being tested? This test measures the amount of PTH in your blood. What kind of sample is taken?  A blood sample is required for this test. It is usually collected by insertinga needle into a blood vessel or by sticking a finger with a small needle. How do I prepare for this test? Do not eat or drink anything after midnight on the night before the test or as told byyour health care provider. How are the results reported? Your test results will be reported as values that indicate the amount of PTH in your blood. Your health care provider will compare your results to normal ranges that were established after testing a large group of people (reference ranges). The results may include amounts for whole PTH and its fragments (PTH N-terminal and PTH C-terminal). Reference ranges may vary among labs and hospitals. For this test, common reference ranges are: PTH intact (whole): 10-65 pg/mL or 10-65 ng/L (SI units). PTH N-terminal: 8-24 pg/mL or 8-24 ng/L (SI  units). PTH C-terminal: 50-330 pg/mL or 50-330 ng/L (SI units). What do the results mean? Test results that are higher than the reference range may mean that you have: Overactive parathyroid glands (hyperparathyroidism). A non-parathyroid PTH-producing tumor. A kidney defect that is present at birth (congenital). Hypocalcemia. Long-term (chronic) kidney failure. Malabsorption syndrome. This means that your body does not properly absorb nutrients such as calcium from the intestinal tract. Vitamin D deficiency. Adequate vitamin D levels are  required for calcium to be absorbed from the intestinal tract. Vitamin D deficiency can lead to low levels of calcium in the blood. Test results that are lower than the reference range may indicate any of the following: Underactive parathyroid glands (hypoparathyroidism). Hypercalcemia. Bone tumor. A disease that causes inflammation in your organs and other areas of your body (sarcoidosis). Vitamin D intoxication. Milk-alkali syndrome. An immune system disorder (DiGeorge syndrome). Talk with your health care provider about what your results mean. Questions to ask your health care provider Ask your health care provider, or the department that is doing the test: When will my results be ready? How will I get my results? What are my treatment options? What other tests do I need? What are my next steps? Summary The parathyroid hormone (PTH) test may be used to evaluate the function of your parathyroid glands and to check for or monitor conditions that affect the calcium levels in your body. The parathyroid glands are four tiny glands that surround the larger thyroid gland in your neck. When your blood calcium levels are low, your parathyroid glands release PTH into the blood. Talk with your health care provider about what your results mean.  Vitamin D Test Why am I having this test? Vitamin D is a vitamin that your body makes when you are exposed to sunlight. It is also found naturally in fish and eggs, and it is added to some foods, such as cereals and dairy products. You need vitamin D to maintain bone health and to support your disease-fighting (immune) system. You may have a vitamin D test: To help diagnose osteoporosis or other bone disorders. To monitor treatment of osteoporosis or other bone disorders. If you have symptoms of a lack (deficiency) of vitamin D, such as experiencing broken bones from minor injuries. If you lack certain nutrients in your diet (dietary deficiencies). If  you have problems absorbing nutrients during digestion. What is being tested? There are two types of vitamin D tests that measure slightly different things: The 25-hydroxyvitamin D test measures how much of a substance called 25-hydroxyvitamin D is in your blood. The body converts vitamin D to 25-hydroxyvitamin D in the liver. Measuring how much 25-hydroxyvitamin D is in your blood provides a good idea of your vitamin D levels. This is the most common type of vitamin D test. The 1,25-dihydroxyvitamin D test measures how much of a substance called 1,25-dihydroxyvitamin D is in your blood. The body converts vitamin D into this substance and then uses it for various functions. This test provides an idea of your total vitamin D levels. What kind of sample is taken?  A blood sample is required for this test. It is usually collected by insertinga needle into a blood vessel or by sticking a finger with a small needle. Tell a health care provider about: Any allergies you have. All medicines you are taking, including vitamins, herbs, eye drops, creams, and over-the-counter medicines. Any blood disorders you have. Any surgeries you have had. Any medical conditions you have. Whether you are  pregnant or may be pregnant. How are the results reported? Your test results will be reported as a value that tells you how much vitamin D is in your blood. Your health care provider will compare your results to normal ranges that were established after testing a large group of people (reference ranges). Reference ranges may vary among labs and hospitals. For vitamin D tests, common reference ranges are: 25-hydroxyvitamin D: 25-80 ng/mL. 1,25-dihydroxyvitamin D: Males: 18-64 pg/mL. Females: 18-78 pg/mL. What do the results mean? If your result is within your reference range, this means that you have anormal amount of vitamin D in your blood. If your result is lower than your reference range, this means that you have  too little vitamin D in your body. If you had the 25-hydroxyvitamin D test specifically: A result of 21-29 ng/mL means that you have slightly lower levels of vitamin D than normal (vitamin D insufficiency). A result of 20 ng/mL or less means that you have very low levels of vitamin D (vitamin D deficiency). Low levels of vitamin D can indicate: Not having enough exposure to sunlight. Not eating enough foods that contain vitamin D. Osteoporosis. Kidney disease. Liver disease. A bone disease that makes the bones weak, soft, or poorly developed (rickets). Softening of the bones (osteomalacia). The body not absorbing vitamin D normally (gastrointestinal malabsorption). If your result is higher than your reference range, this means that you have too much vitamin D in your body. This can result from: Taking too many dietary supplements. Hyperparathyroidism. This is a rare condition in which you do not make enough of a certain type of hormone (parathyroid hormone or PTH). A disease that causes inflammation in your organs and other areas of your body (sarcoidosis). A rare developmental disorder that is present at birth Jimmye Norman syndrome). Talk with your health care provider about what your results mean. Questions to ask your health care provider Ask your health care provider, or the department that is doing the test: When will my results be ready? How will I get my results? What are my treatment options? What other tests do I need? What are my next steps? Summary You may have a vitamin D test to determine if your body has enough vitamin D. You need vitamin D to maintain bone health and to support your disease-fighting (immune) system. Your vitamin D level is determined with a blood test. Talk with your health care provider about what your results mean. This information is not intended to replace advice given to you by your health care provider. Make sure you discuss any questions you have with  your healthcare provider. Document Revised: 03/06/2020 Document Reviewed: 03/06/2020 Elsevier Patient Education  [place].

## 2021-01-29 DIAGNOSIS — H401121 Primary open-angle glaucoma, left eye, mild stage: Secondary | ICD-10-CM | POA: Diagnosis not present

## 2021-01-29 DIAGNOSIS — H401112 Primary open-angle glaucoma, right eye, moderate stage: Secondary | ICD-10-CM | POA: Diagnosis not present

## 2021-01-29 LAB — BASIC METABOLIC PANEL
BUN/Creatinine Ratio: 16 (calc) (ref 6–22)
BUN: 24 mg/dL (ref 7–25)
CO2: 31 mmol/L (ref 20–32)
Calcium: 9.8 mg/dL (ref 8.6–10.3)
Chloride: 104 mmol/L (ref 98–110)
Creat: 1.47 mg/dL — ABNORMAL HIGH (ref 0.70–1.22)
Glucose, Bld: 99 mg/dL (ref 65–99)
Potassium: 4.5 mmol/L (ref 3.5–5.3)
Sodium: 141 mmol/L (ref 135–146)

## 2021-01-29 LAB — PARATHYROID HORMONE, INTACT (NO CA): PTH: 33 pg/mL (ref 16–77)

## 2021-01-29 LAB — VITAMIN D 25 HYDROXY (VIT D DEFICIENCY, FRACTURES): Vit D, 25-Hydroxy: 80 ng/mL (ref 30–100)

## 2021-02-04 ENCOUNTER — Telehealth: Payer: Self-pay

## 2021-02-04 MED ORDER — ALENDRONATE SODIUM 70 MG PO TABS: 70.0000 mg | ORAL_TABLET | ORAL | 0 refills | Status: DC

## 2021-02-04 NOTE — Telephone Encounter (Signed)
Patient called checking the status of his prescription for Alendronate.  Patient states Dr. Benjamine Mola was calling in the prescription once he received the results of his labwork.  Patient states the prescription needs to be sent to [facility].

## 2021-02-04 NOTE — Addendum Note (Signed)
Addended by: Collier Salina on: 02/04/2021 08:37 PM   Modules accepted: Orders

## 2021-02-04 NOTE — Telephone Encounter (Signed)
Incorrectly sent to local pharmacy, can disregard this I now sent the one to mail order pharmacy today.

## 2021-02-05 NOTE — Telephone Encounter (Signed)
Spoke with patient, advised Fosamax was sent to [facility].

## 2021-02-06 ENCOUNTER — Other Ambulatory Visit (HOSPITAL_COMMUNITY): Payer: Self-pay

## 2021-02-06 ENCOUNTER — Telehealth: Payer: Self-pay | Admitting: Internal Medicine

## 2021-02-06 NOTE — Telephone Encounter (Signed)
Per [place], patient's alendronate is in the process of being filled. Unable to run test claim since filled 02/04/21. Alendronate is currently in process at [facility] [address] which is why [facility] Ros[facility]ti is unable to fill medication. Called patient to advise. He will plan to pick up from local [facility] this time. Patient aware that future refill can come from [facility] since Dr. Benjamine Mola already sent rx.  Patient verbalized understanding. Nothing further needed.  Knox Saliva, PharmD, MPH, BCPS Clinical Pharmacist (Rheumatology and Pulmonology)

## 2021-02-06 NOTE — Telephone Encounter (Signed)
Patient calling in reference to generic Fosamax rx. Per patient, [facility] sent a text stating they could not send out rx until Nov. 2. Per patient, he is not taking this medication. He does not understand why he cannot get rx until then. Please call pharmacy for patient, and advise.

## 2021-02-17 ENCOUNTER — Ambulatory Visit: Payer: Medicare HMO | Admitting: Internal Medicine

## 2021-02-17 ENCOUNTER — Encounter: Payer: Self-pay | Admitting: Internal Medicine

## 2021-02-17 ENCOUNTER — Other Ambulatory Visit: Payer: Self-pay

## 2021-02-17 VITALS — BP 124/76 | HR 80 | Ht 64.0 in | Wt 126.4 lb

## 2021-02-17 DIAGNOSIS — E1122 Type 2 diabetes mellitus with diabetic chronic kidney disease: Secondary | ICD-10-CM | POA: Diagnosis not present

## 2021-02-17 DIAGNOSIS — N1832 Chronic kidney disease, stage 3b: Secondary | ICD-10-CM

## 2021-02-17 DIAGNOSIS — E1142 Type 2 diabetes mellitus with diabetic polyneuropathy: Secondary | ICD-10-CM

## 2021-02-17 DIAGNOSIS — E1159 Type 2 diabetes mellitus with other circulatory complications: Secondary | ICD-10-CM

## 2021-02-17 LAB — POCT GLYCOSYLATED HEMOGLOBIN (HGB A1C): Hemoglobin A1C: 7.1 % — AB (ref 4.0–5.6)

## 2021-02-17 MED ORDER — GLIMEPIRIDE 1 MG PO TABS: 1.0000 mg | ORAL_TABLET | Freq: Every day | ORAL | 2 refills | Status: DC

## 2021-02-17 MED ORDER — JANUMET XR 50-1000 MG PO TB24: 2.0000 | ORAL_TABLET | Freq: Every day | ORAL | 2 refills | Status: DC

## 2021-02-17 NOTE — Progress Notes (Signed)
Name: [name]  Age/ Sex: 87 y.o., male   MRN/ DOB: [id number], [birth year]     PCP: Carollee Herter, Alferd Apa, DO   Reason for Endocrinology Evaluation: Type 2 Diabetes Mellitus  Initial Endocrine Consultative Visit: 02/06/2020    PATIENT IDENTIFIER: [name] is a 87 y.o. male with a past medical history of T2DM, CAD and HTN. The patient has followed with Endocrinology clinic since 02/06/2020 for consultative assistance with management of his diabetes.  DIABETIC HISTORY:  [name] was diagnosed with DM in 2008, has been on Glimepiride for years, janumet started in 01/2020. His hemoglobin A1c has ranged from 6.7% in 2017, peaking at 9.6%in 2021.   On initial visit his A1c was 9.6 % , he was just started on Janumet and we did not make any changes and continued Glimepiride.   SUBJECTIVE:   During the last visit (08/12/2020): A1c 7.2 %. We continued  Janumet  And Glimepiride   Today (02/17/2021): [name] is here for a follow up on diabetes management.  He checks his blood sugars 1 times daily, preprandial to breakfast. The patient has hypoglycemic episodes since the last clinic visit, less frequent then in the past    Denies nausea , vomiting or diarrhea  No headaches in yrs    HOME DIABETES REGIMEN:  Glimepiride 2 mg daily  Janumet 50-1000 mg, 2 tabs daily     Statin: yes ACE-I/ARB: yes   GLUCOSE LOG: 67- 144  mg/dL   DIABETIC COMPLICATIONS: Microvascular complications:  CKD III Denies: retinopathy, neuropathy Last Eye Exam: Completed 07/2020  Macrovascular complications:  CAD ( S/P stent) Denies:  CVA, PVD   HISTORY:  Past Medical History:  Past Medical History:  Diagnosis Date  . CAD (coronary artery disease)    LHC 03/25/11 by Dr. Burt Knack:  pLAD 99%, oCFX 20-30%, pOM1 40%, dAVCFX 20-30%.  EF was normal on nuclear study.  He was treated with a Promus DES to his pLAD.   [name] Colon polyps 1996   villous adenoma  . DM type 2 (diabetes mellitus, type 2) ([place])  2002  . Fuchs' corneal dystrophy   . Hyperlipidemia   . Hypertension   . Macular degeneration    posterior vitreous vitreous detachment  . Prostate cancer [facility]) dx 2018   Past Surgical History:  Past Surgical History:  Procedure Laterality Date  . CARDIAC CATHETERIZATION  03/25/2011  . cataract Bilateral 2010  . corenea implants  june and sept 2018   [name]  . CORONARY STENT PLACEMENT  03/25/2011  . RADIOACTIVE SEED IMPLANT N/A 05/06/2017   Procedure: RADIOACTIVE SEED IMPLANT/BRACHYTHERAPY IMPLANT;  Surgeon: Kathie Rhodes, MD;  Location: [place];  Service: Urology;  Laterality: N/A;  . SPACE OAR INSTILLATION N/A 05/06/2017   Procedure: SPACE OAR INSTILLATION;  Surgeon: Kathie Rhodes, MD;  [facility];  Service: Urology;  Laterality: N/A;   Social History:  reports that he quit smoking about 62 years ago. His smoking use included cigarettes. He has a 0.75 pack-year smoking history. He has never used smokeless tobacco. He reports that he does not currently use alcohol. He reports that he does not use drugs. Family History:  Family History  Problem Relation Age of Onset  . Stomach cancer Mother 52  . Coronary artery disease Father 24       deceased  . Healthy Son   . Healthy Son   . Healthy Son  HOME MEDICATIONS: Allergies as of 02/17/2021   No Known Allergies      Medication List        Accurate as of February 17, 2021 12:42 PM. If you have any questions, ask your nurse or doctor.          Alcohol Swabs Pads Use as directed once a day   alendronate 70 MG tablet Commonly known as: Fosamax Take 1 tablet (70 mg total) by mouth once a week. Take with a full glass of water on an empty stomach.   amLODipine 2.5 MG tablet Commonly known as: NORVASC Take 1 tablet (2.5 mg total) by mouth daily.   aspirin 81 MG tablet Take 81 mg by mouth daily.   atorvastatin 20 MG tablet Commonly known as: LIPITOR Take  1 tablet (20 mg total) by mouth daily.   brimonidine-timolol 0.2-0.5 % ophthalmic solution Commonly known as: COMBIGAN Place 1 drop into both eyes 2 (two) times daily.   COLLAGEN PO Take by mouth. 1 scoop daily   fish oil-omega-3 fatty acids 1000 MG capsule Take 2 g by mouth daily.   glimepiride 1 MG tablet Commonly known as: AMARYL Take 1 tablet (1 mg total) by mouth daily with breakfast. What changed:  medication strength See the new instructions. Changed by: Dorita Sciara, MD   Janumet XR 50-1000 MG Tb24 Generic drug: SitaGLIPtin-MetFORMIN HCl Take 2 tablets by mouth daily.   losartan-hydrochlorothiazide 100-25 MG tablet Commonly known as: HYZAAR Take 1 tablet by mouth daily.   metoprolol succinate 50 MG 24 hr tablet Commonly known as: TOPROL-XL TAKE 1 TABLET BY MOUTH DAILY. TAKE WITH OR IMMEDIATELY FOLLOWING A MEAL.   multivitamin tablet Take 1 tablet by mouth daily.   tamsulosin 0.4 MG Caps capsule Commonly known as: FLOMAX TAKE 1 CAPSULE (0.4 MG TOTAL) BY MOUTH DAILY.   True Metrix Blood Glucose Test test strip Generic drug: glucose blood USE AS DIRECTED ONCE A DAY   True Metrix Level 3 High Soln Use to check controls on glucometer strips every 30 days or with each new bottle of strips (whichever comes first).   True Metrix Level 1 Low Soln USE AS DIRECTED   True Metrix Meter w/Device Kit USE AS DIRECTED ONCE A DAY   TRUEplus Lancets 33G Misc Use to check blood sugar once a day.  DX  E11.9         OBJECTIVE:   Vital Signs: BP 124/76 (BP Location: Left Arm, Patient Position: Sitting, Cuff Size: Small)   Pulse 80   Ht _0  (1.626 m)   Wt 126 lb 6.4 oz (57.3 kg)   SpO2 96%   BMI 21.70 kg/m   Wt Readings from Last 3 Encounters:  02/17/21 126 lb 6.4 oz (57.3 kg)  01/28/21 129 lb 6.4 oz (58.7 kg)  12/04/20 127 lb 12.8 oz (58 kg)     Exam: General: Pt appears well and is in NAD  Lungs: Clear with good BS bilat with no rales,  rhonchi, or wheezes  Heart: RRR with normal S1 and S2 and no gallops; no murmurs; no rub  Extremities: No pretibial edema..  Neuro: MS is good with appropriate affect, pt is alert and Ox3   DM foot exam: 02/17/2021   The skin of the feet is intact without sores or ulcerations. The pedal pulses are 1+ on right and 1+ on left. The sensation is intact to a screening 5.07, 10 gram monofilament on the right  DATA REVIEWED:  Lab Results  Component Value Date   HGBA1C 7.1 (A) 02/17/2021   HGBA1C 7.2 (H) 07/31/2020   HGBA1C 6.6 (A) 04/15/2020   Results for [name], [name] (MRN [id number]) as of 02/17/2021 08:23  Ref. Range 01/28/2021 08:40  Sodium Latest Ref Range: 135 - 146 mmol/L 141  Potassium Latest Ref Range: 3.5 - 5.3 mmol/L 4.5  Chloride Latest Ref Range: 98 - 110 mmol/L 104  CO2 Latest Ref Range: 20 - 32 mmol/L 31  Glucose Latest Ref Range: 65 - 99 mg/dL 99  BUN Latest Ref Range: 7 - 25 mg/dL 24  Creatinine Latest Ref Range: 0.70 - 1.22 mg/dL 1.47 (H)  Calcium Latest Ref Range: 8.6 - 10.3 mg/dL 9.8  BUN/Creatinine Ratio Latest Ref Range: 6 - 22 (calc) 16  Vitamin D, 25-Hydroxy Latest Ref Range: 30 - 100 ng/mL 80  PTH, Intact Latest Ref Range: 16 - 77 pg/mL 33   04/04/2020 MA/Cr ratio 152 ASSESSMENT / PLAN / RECOMMENDATIONS:   1) Type 2 Diabetes Mellitus, With CKD III and neuropathic and macrovascular complications - Most recent A1c of 7.1 %. Goal A1c < 7.5 %.      -He is tolerating the Janumet without any side effects, we will continue to monitor his GFR, and adjust Janumet appropriately. - Pt has been noted with hypoglycemia, not as frequent , will reduce Glimepiride as below     MEDICATIONS: Decrease Glimepiride 1 mg daily Continue Janumet 50-1000 mg, 2 tabs daily  EDUCATION / INSTRUCTIONS: BG monitoring instructions: Patient is instructed to check his blood sugars 1 times a day. Call [place] [facility] if: BG persistently < 70  I reviewed the  Rule of 15 for the treatment of hypoglycemia in detail with the patient. Literature supplied.    2) Diabetic complications:  Eye: Does not have known diabetic retinopathy.  Neuro/ Feet: Does have known diabetic peripheral neuropathy .  Renal: Patient does  have known baseline CKD. He   is  on an ACEI/ARB at present.    F/U in 6 months   Signed electronically by: Mack Guise, MD  [facility] Endocrinology  [address] [place], [place] [address] Phone: [phone] FAX: [phone]   CC: [address] [place] [address] Phone: [phone]  Fax: [phone]  Return to Endocrinology clinic as below: Future Appointments  Date Time Provider [place]  04/27/2021  2:30 PM [facility] None  04/27/2021  3:00 PM Orson Slick, MD [facility] None  05/04/2021 10:40 AM Collier Salina, MD [facility] None  06/23/2021 11:15 AM [facility] PHARMACIST [facility]  08/03/2021  8:20 AM [name], DO [facility]  08/18/2021  7:50 AM Lonetta Blassingame, Melanie Crazier, MD [facility]  09/17/2021  8:20 AM [facility] HEALTH COACH [facility]

## 2021-02-17 NOTE — Patient Instructions (Signed)
-   Decrease Amaryl to 1  mg daily before Breakfast  - Continue Janumet 50-1000 mg TWO tablets with Breakfast       HOW TO TREAT LOW BLOOD SUGARS (Blood sugar LESS THAN 70 MG/DL) Please follow the RULE OF 15 for the treatment of hypoglycemia treatment (when your (blood sugars are less than 70 mg/dL)   STEP 1: Take 15 grams of carbohydrates when your blood sugar is low, which includes:  3-4 GLUCOSE TABS  OR 3-4 OZ OF JUICE OR REGULAR SODA OR ONE TUBE OF GLUCOSE GEL    STEP 2: RECHECK blood sugar in 15 MINUTES STEP 3: If your blood sugar is still low at the 15 minute recheck --> then, go back to STEP 1 and treat AGAIN with another 15 grams of carbohydrates.

## 2021-02-25 ENCOUNTER — Telehealth: Payer: Self-pay

## 2021-02-25 NOTE — Progress Notes (Signed)
Chronic Care Management Pharmacy Assistant   Name: [name]  MRN: [id number] DOB: 08/22/1931   Reason for Encounter: Disease State Diabetes    Recent office visits:  None noted   Recent consult visits:  02/17/21 Endocrinology - [name] [facility] MD - Seen for diabetes - Labs ordered - Decrease Glimepiride 1 mg daily - Follow up in 6 months  01/29/21 Eye center - Donald Harrier Bond MD - Seen for glaucoma Evaluation - No medication changes - Follow up in 6 months  01/28/21 Rheumatology - Donald Salina MD - Seen for osteoporosis with pathological fracture of left forearm with routine healing - Start Alondronate 70 mg once weekly - Follow up in 3 months     Hospital visits:   Admitted to the hospital on 10/01/20 due to Right foot pain. Discharge date was 10/01/20. Discharged from [facility].  New?Medications Started at [facility] Discharge:?? -started Tramadol 50 mg every 6 hours PRN  Medication Changes at Hospital Discharge: N/a  Medications Discontinued at Hospital Discharge: N/a  Medications that remain the same after Hospital Discharge:??  -All other medications will remain the same.    Medications: Outpatient Encounter Medications as of 02/25/2021  Medication Sig   Alcohol Swabs PADS Use as directed once a day   alendronate (FOSAMAX) 70 MG tablet Take 1 tablet (70 mg total) by mouth once a week. Take with a full glass of water on an empty stomach.   amLODipine (NORVASC) 2.5 MG tablet Take 1 tablet (2.5 mg total) by mouth daily.   aspirin 81 MG tablet Take 81 mg by mouth daily.   atorvastatin (LIPITOR) 20 MG tablet Take 1 tablet (20 mg total) by mouth daily.   Blood Glucose Calibration (TRUE METRIX LEVEL 1) Low SOLN USE AS DIRECTED   Blood Glucose Calibration (TRUE METRIX LEVEL 3) High SOLN Use to check controls on glucometer strips every 30 days or with each new bottle of strips (whichever comes first).   Blood Glucose Monitoring Suppl  (TRUE METRIX METER) w/Device KIT USE AS DIRECTED ONCE A DAY   brimonidine-timolol (COMBIGAN) 0.2-0.5 % ophthalmic solution Place 1 drop into both eyes 2 (two) times daily.   COLLAGEN PO Take by mouth. 1 scoop daily   fish oil-omega-3 fatty acids 1000 MG capsule Take 2 g by mouth daily.   glimepiride (AMARYL) 1 MG tablet Take 1 tablet (1 mg total) by mouth daily with breakfast.   glucose blood (TRUE METRIX BLOOD GLUCOSE TEST) test strip USE AS DIRECTED ONCE A DAY   losartan-hydrochlorothiazide (HYZAAR) 100-25 MG tablet Take 1 tablet by mouth daily.   metoprolol succinate (TOPROL-XL) 50 MG 24 hr tablet TAKE 1 TABLET BY MOUTH DAILY. TAKE WITH OR IMMEDIATELY FOLLOWING A MEAL.   Multiple Vitamin (MULTIVITAMIN) tablet Take 1 tablet by mouth daily.   SitaGLIPtin-MetFORMIN HCl (JANUMET XR) 50-1000 MG TB24 Take 2 tablets by mouth daily.   tamsulosin (FLOMAX) 0.4 MG CAPS capsule TAKE 1 CAPSULE (0.4 MG TOTAL) BY MOUTH DAILY.   TRUEplus Lancets 33G MISC Use to check blood sugar once a day.  DX  E11.9   No facility-administered encounter medications on file as of 02/25/2021.   Recent Relevant Labs: Lab Results  Component Value Date/Time   HGBA1C 7.1 (A) 02/17/2021 08:29 AM   HGBA1C 7.2 (H) 07/31/2020 09:58 AM   HGBA1C 6.6 (A) 04/15/2020 08:17 AM   HGBA1C 9.6 (H) 01/15/2020 09:58 AM   MICROALBUR 63.4 (H) 07/31/2020 09:58 AM   MICROALBUR  57.1 (H) 01/15/2020 09:58 AM    Kidney Function Lab Results  Component Value Date/Time   CREATININE 1.47 (H) 01/28/2021 08:40 AM   CREATININE 1.47 12/04/2020 09:10 AM   CREATININE 1.54 (H) 10/27/2020 09:15 AM   CREATININE 1.20 07/31/2020 09:58 AM   CREATININE 1.52 (H) 04/28/2020 08:48 AM   GFR 42.82 (L) 12/04/2020 09:10 AM   GFRNONAA 44 (L) 10/27/2020 09:15 AM   GFRAA 45 (L) 10/26/2019 08:54 AM    Current antihyperglycemic regimen:  Glimepiride 1 mg daily Janumet 50-1000 mg, 2 tabs daily What recent interventions/DTPs have been made to improve glycemic  control:  N/a Have there been any recent hospitalizations or ED visits since last visit with CPP? No Patient denies hypoglycemic symptoms, including Pale, Sweaty, Shaky, Hungry, Nervous/irritable, and Vision changes Patient denies hyperglycemic symptoms, including blurry vision, excessive thirst, fatigue, polyuria, and weakness How often are you checking your blood sugar? once daily What are your blood sugars ranging?  Fasting: 9/22 @ 92 9/20 _0  9/18 @ 102 9/16 _1   Before meals: n/a After meals: n/a Bedtime: n/a During the week, how often does your blood glucose drop below 70? Never Are you checking your feet daily/regularly? Patient states that he does not check feet daily but does not have any injuries   Adherence Review: Is the patient currently on a STATIN medication? Yes Is the patient currently on ACE/ARB medication? Yes Does the patient have >5 day gap between last estimated fill dates? No  Misc comment: [name] is a very pleasant man. He states that he is doing well and has no concerns at this time. He enjoys gardening and is currently planting tomatoes this year. He states that he gets a starbucks coffee every morning, eats out 7 days a week, mostly enjoys seafood, avoids red meats, drinks collagen powder daily, and enjoys playing piano with his friends. Patient also mentioned that his Metformin medication was a little expensive, but does not want to  consider patient assistance at this time. 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 Overall, Mr [name] has no concerns at this time and appreciates the call.   Star Rating Drugs: Atorvastatin 20 mg last fill date 01/06/21 90 DS  Glimepiride 1 mg last fill date 02/17/21 90 DS Losartan- hydrochlorothiazide 100-25 mg  last fill date 01/06/21 90 DS Janumet XR 50-1000 mg last fill date 02/17/21 90 DS    Andee Poles, CMA

## 2021-04-23 ENCOUNTER — Other Ambulatory Visit: Payer: Self-pay | Admitting: Internal Medicine

## 2021-04-23 DIAGNOSIS — M80032D Age-related osteoporosis with current pathological fracture, left forearm, subsequent encounter for fracture with routine healing: Secondary | ICD-10-CM

## 2021-04-23 NOTE — Telephone Encounter (Signed)
Next Visit: 05/04/2021  Last Visit: 01/28/2021  Last Fill: 02/04/2021  DX: Osteoporosis with pathological fracture of left forearm with routine healing, subsequent encounter   Current Dose per last prescription on 02/04/2021: 1 tablet (70 mg total) by mouth once a week. Take with a full glass of water on an empty stomach.  Labs: 01/28/2021   Okay to refill Fosamax?

## 2021-04-27 ENCOUNTER — Inpatient Hospital Stay: Payer: Medicare HMO | Attending: Hematology and Oncology | Admitting: Hematology and Oncology

## 2021-04-27 ENCOUNTER — Other Ambulatory Visit: Payer: Self-pay

## 2021-04-27 ENCOUNTER — Encounter: Payer: Self-pay | Admitting: Hematology and Oncology

## 2021-04-27 ENCOUNTER — Other Ambulatory Visit: Payer: Self-pay | Admitting: Hematology and Oncology

## 2021-04-27 ENCOUNTER — Inpatient Hospital Stay: Payer: Medicare HMO

## 2021-04-27 VITALS — BP 162/85 | HR 64 | Temp 97.0°F | Resp 17 | Wt 128.6 lb

## 2021-04-27 DIAGNOSIS — D472 Monoclonal gammopathy: Secondary | ICD-10-CM

## 2021-04-27 DIAGNOSIS — C61 Malignant neoplasm of prostate: Secondary | ICD-10-CM | POA: Diagnosis not present

## 2021-04-27 DIAGNOSIS — Z8546 Personal history of malignant neoplasm of prostate: Secondary | ICD-10-CM | POA: Diagnosis not present

## 2021-04-27 LAB — CBC WITH DIFFERENTIAL (CANCER CENTER ONLY)
Abs Immature Granulocytes: 0.03 10*3/uL (ref 0.00–0.07)
Basophils Absolute: 0.1 10*3/uL (ref 0.0–0.1)
Basophils Relative: 1 %
Eosinophils Absolute: 0.1 10*3/uL (ref 0.0–0.5)
Eosinophils Relative: 1 %
HCT: 38.3 % — ABNORMAL LOW (ref 39.0–52.0)
Hemoglobin: 12.8 g/dL — ABNORMAL LOW (ref 13.0–17.0)
Immature Granulocytes: 0 %
Lymphocytes Relative: 24 %
Lymphs Abs: 2.4 10*3/uL (ref 0.7–4.0)
MCH: 30.2 pg (ref 26.0–34.0)
MCHC: 33.4 g/dL (ref 30.0–36.0)
MCV: 90.3 fL (ref 80.0–100.0)
Monocytes Absolute: 0.8 10*3/uL (ref 0.1–1.0)
Monocytes Relative: 8 %
Neutro Abs: 6.7 10*3/uL (ref 1.7–7.7)
Neutrophils Relative %: 66 %
Platelet Count: 231 10*3/uL (ref 150–400)
RBC: 4.24 MIL/uL (ref 4.22–5.81)
RDW: 13.6 % (ref 11.5–15.5)
WBC Count: 10.1 10*3/uL (ref 4.0–10.5)
nRBC: 0 % (ref 0.0–0.2)

## 2021-04-27 LAB — CMP (CANCER CENTER ONLY)
ALT: 20 U/L (ref 0–44)
AST: 17 U/L (ref 15–41)
Albumin: 3.6 g/dL (ref 3.5–5.0)
Alkaline Phosphatase: 88 U/L (ref 38–126)
Anion gap: 7 (ref 5–15)
BUN: 17 mg/dL (ref 8–23)
CO2: 28 mmol/L (ref 22–32)
Calcium: 8.8 mg/dL — ABNORMAL LOW (ref 8.9–10.3)
Chloride: 107 mmol/L (ref 98–111)
Creatinine: 1.35 mg/dL — ABNORMAL HIGH (ref 0.61–1.24)
GFR, Estimated: 51 mL/min — ABNORMAL LOW (ref >60)
Glucose, Bld: 121 mg/dL — ABNORMAL HIGH (ref 70–99)
Potassium: 4.4 mmol/L (ref 3.5–5.1)
Sodium: 142 mmol/L (ref 135–145)
Total Bilirubin: 0.4 mg/dL (ref 0.3–1.2)
Total Protein: 6.7 g/dL (ref 6.5–8.1)

## 2021-04-27 NOTE — Progress Notes (Signed)
[place] [phone]   [phone]  PROGRESS NOTE  Patient Care Team: Carollee Herter, Alferd Apa, DO as PCP - General (Family Medicine) Kathie Rhodes, MD (Inactive) as Consulting Physician (Urology) Tyler Pita, MD as Consulting Physician (Radiation Oncology) Marilynne Halsted, MD as Referring Physician (Ophthalmology) Orson Slick, MD as Consulting Physician (Hematology and Oncology) [facility], Melanie Crazier, MD as Consulting Physician (Endocrinology) Robley Fries, MD as Consulting Physician (Urology) Madelon Lips, MD as Consulting Physician (Nephrology) Cherre Robins, RPH-CPP (Pharmacist)  Hematological/Oncological History #Smoldering Multiple Myeloma, Intermediate Risk 1) 02/15/19: found to have ambda chains 645.5, kappa 27.9 with a ratio of 0.04, no serum M protein during nephrology work up.  2) 04/06/19: establish care with Dr. Lorenso Courier 3) 04/06/19: SPEP shows no serum monoclonal protein, however high levels of M protein detected in the urine. B2Microglobulin at 3.3. Bone Survey negative.  4)04/23/19: Bone Marrow biopsy performed, confirmed 10-15% clonal plasma cells (9% in aspirate). Confirmed diagnosis of Smoldering multiple myeloma, intermediate risk. WBC 8.6, Hgb 12.4 5) 10/30/2019: WBC 9.2, Hgb 14.6, Cr 1.59, Kappa 29.4, Lambda 597.1, ratio 0.05.    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 Gleason Score of 3+4 1) 05/06/17: Insertion of radioactive I-125 seeds into the prostate gland with placement of SpaceOAR; 145 Gy, definitive therapy. Radiation oncologist was Dr. Tammi Klippel at [facility] 2) Subsequently followed by outside urology with serial PSA measurements. Reported last measurement in Oct 2020 at PSA 0.2.   Interval History:  [name] 87 y.o. male with medical history significant for smoldering multiple myeloma and prostate cancer presents for a follow up visit. He was last seen on 10/27/2020 for a routine f/u. In the  interim since his last visit [name] has had no changes in medications, no hospitalizations or emergency room visits.  On exam today [name] reports he has been well in the interim since her last visit.  He reports that he is "quite fast for my age".  He recently won gold and silver metals in athletics competitions for men his age.  He reports that he starts the day with avocado and coconut oil and enjoys salads with goat cheese.  He reports he has been doing his best to reduce intake of sugar.  He also enjoys eating salmon and clam on a regular basis.  He reports that his urine was frothy the last time he saw him but that this has subsequently resolved.  He is overall satisfied with his energy levels and has no questions concerns or complaints today.  On further discussion he denies any bleeding, bruising, dark stools, shortness of breath, fatigue, lightheadedness, dizziness or new bone or back pain.  A full 10 point ROS is listed below.  MEDICAL HISTORY:  Past Medical History:  Diagnosis Date   CAD (coronary artery disease)    LHC 03/25/11 by Dr. Burt Knack:  pLAD 99%, oCFX 20-30%, pOM1 40%, dAVCFX 20-30%.  EF was normal on nuclear study.  He was treated with a Promus DES to his pLAD.    Colon polyps 1996   villous adenoma   DM type 2 (diabetes mellitus, type 2) ([place]) 2002   Fuchs' corneal dystrophy    Hyperlipidemia    Hypertension    Macular degeneration    posterior vitreous vitreous detachment   Prostate cancer ([place]) dx 2018    SURGICAL HISTORY: Past Surgical History:  Procedure Laterality Date   CARDIAC CATHETERIZATION  03/25/2011   cataract Bilateral 2010   corenea implants  june  and sept 2018   [name]   CORONARY STENT PLACEMENT  03/25/2011   RADIOACTIVE SEED IMPLANT N/A 05/06/2017   Procedure: RADIOACTIVE SEED IMPLANT/BRACHYTHERAPY IMPLANT;  Surgeon: Kathie Rhodes, MD;  Location: [facility];  Service: Urology;  Laterality: N/A;   SPACE OAR  INSTILLATION N/A 05/06/2017   Procedure: SPACE OAR INSTILLATION;  Surgeon: Kathie Rhodes, MD;  [facility];  Service: Urology;  Laterality: N/A;    SOCIAL HISTORY: Social History   Socioeconomic History   Marital status: Widowed    Spouse name: Not on file   Number of children: Not on file   Years of education: Not on file   Highest education level: Not on file  Occupational History   Occupation: retired  Tobacco Use   Smoking status: Former    Packs/day: 0.25    Years: 3.00    Pack years: 0.75    Types: Cigarettes    Quit date: 06/07/1958    Years since quitting: 62.9   Smokeless tobacco: Never  Vaping Use   Vaping Use: Never used  Substance and Sexual Activity   Alcohol use: Not Currently   Drug use: No   Sexual activity: Not Currently  Other Topics Concern   Not on file  Social History Narrative   Not on file   Social Determinants of Health   Financial Resource Strain: Low Risk    Difficulty of Paying Living Expenses: Not hard at all  Food Insecurity: No Food Insecurity   Worried About Charity fundraiser in the Last Year: Never true   [place] in the Last Year: Never true  Transportation Needs: No Transportation Needs   Lack of Transportation (Medical): No   Lack of Transportation (Non-Medical): No  Physical Activity: Sufficiently Active   Days of Exercise per Week: 7 days   Minutes of Exercise per Session: 30 min  Stress: No Stress Concern Present   Feeling of Stress : Not at all  Social Connections: Moderately Integrated   Frequency of Communication with Friends and Family: More than three times a week   Frequency of Social Gatherings with Friends and Family: Twice a week   Attends Religious Services: More than 4 times per year   Active Member of [employer] or Organizations: Yes   Attends Archivist Meetings: More than 4 times per year   Marital Status: Widowed  Human resources officer Violence: Not At Risk   Fear of Current  or Ex-Partner: No   Emotionally Abused: No   Physically Abused: No   Sexually Abused: No    FAMILY HISTORY: Family History  Problem Relation Age of Onset   Stomach cancer Mother 18   Coronary artery disease Father 74       deceased   Healthy Son    Healthy Son    Healthy Son     ALLERGIES:  has No Known Allergies.  MEDICATIONS:  Current Outpatient Medications  Medication Sig Dispense Refill   Alcohol Swabs PADS Use as directed once a day 100 each 1   alendronate (FOSAMAX) 70 MG tablet TAKE 1 TABLET ONCE WEEKLY *TAKE WITH A FULL GLASS OF WATER ON AN EMPTY STOMACH* 12 tablet 0   amLODipine (NORVASC) 2.5 MG tablet Take 1 tablet (2.5 mg total) by mouth daily. 90 tablet 1   aspirin 81 MG tablet Take 81 mg by mouth daily.     atorvastatin (LIPITOR) 20 MG tablet Take 1 tablet (20 mg total) by  mouth daily. 90 tablet 1   Blood Glucose Calibration (TRUE METRIX LEVEL 1) Low SOLN USE AS DIRECTED 1 each 3   Blood Glucose Calibration (TRUE METRIX LEVEL 3) High SOLN Use to check controls on glucometer strips every 30 days or with each new bottle of strips (whichever comes first). 3 each 1   Blood Glucose Monitoring Suppl (TRUE METRIX METER) w/Device KIT USE AS DIRECTED ONCE A DAY 1 kit 0   brimonidine-timolol (COMBIGAN) 0.2-0.5 % ophthalmic solution Place 1 drop into both eyes 2 (two) times daily.     COLLAGEN PO Take by mouth. 1 scoop daily     fish oil-omega-3 fatty acids 1000 MG capsule Take 2 g by mouth daily.     glimepiride (AMARYL) 1 MG tablet Take 1 tablet (1 mg total) by mouth daily with breakfast. 90 tablet 2   glucose blood (TRUE METRIX BLOOD GLUCOSE TEST) test strip USE AS DIRECTED ONCE A DAY 100 strip 3   losartan-hydrochlorothiazide (HYZAAR) 100-25 MG tablet Take 1 tablet by mouth daily. 90 tablet 1   metoprolol succinate (TOPROL-XL) 50 MG 24 hr tablet TAKE 1 TABLET BY MOUTH DAILY. TAKE WITH OR IMMEDIATELY FOLLOWING A MEAL. 90 tablet 0   Multiple Vitamin (MULTIVITAMIN) tablet Take  1 tablet by mouth daily.     SitaGLIPtin-MetFORMIN HCl (JANUMET XR) 50-1000 MG TB24 Take 2 tablets by mouth daily. 180 tablet 2   tamsulosin (FLOMAX) 0.4 MG CAPS capsule TAKE 1 CAPSULE (0.4 MG TOTAL) BY MOUTH DAILY. 90 capsule 1   TRUEplus Lancets 33G MISC Use to check blood sugar once a day.  DX  E11.9 100 each 1   No current facility-administered medications for this visit.    REVIEW OF SYSTEMS:   Constitutional: ( - ) fevers, ( - )  chills , ( - ) night sweats Eyes: ( - ) blurriness of vision, ( - ) double vision, ( - ) watery eyes Ears, nose, mouth, throat, and face: ( - ) mucositis, ( - ) sore throat Respiratory: ( - ) cough, ( - ) dyspnea, ( - ) wheezes Cardiovascular: ( - ) palpitation, ( - ) chest discomfort, ( - ) lower extremity swelling Gastrointestinal:  ( - ) nausea, ( - ) heartburn, ( - ) change in bowel habits Skin: ( - ) abnormal skin rashes Lymphatics: ( - ) new lymphadenopathy, ( - ) easy bruising Neurological: ( - ) numbness, ( - ) tingling, ( - ) new weaknesses Behavioral/Psych: ( - ) mood change, ( - ) new changes  All other systems were reviewed with the patient and are negative.  PHYSICAL EXAMINATION: ECOG PERFORMANCE STATUS: 0 - Asymptomatic  Vitals:   04/27/21 1453  BP: (!) 162/85  Pulse: 64  Resp: 17  Temp: (!) 97 F (36.1 C)  SpO2: 99%   Filed Weights   04/27/21 1453  Weight: 128 lb 9.6 oz (58.3 kg)    GENERAL: well appearing elderly Caucasian male, alert, no distress and comfortable SKIN: skin color, texture, turgor are normal, no rashes or significant lesions EYES: conjunctiva are pink and non-injected, sclera clear LUNGS: clear to auscultation and percussion with normal breathing effort HEART: regular rate & rhythm and no murmurs and no lower extremity edema Musculoskeletal: no cyanosis of digits and no clubbing  PSYCH: alert & oriented x 3, fluent speech NEURO: no focal motor/sensory deficits  LABORATORY DATA:  I have reviewed the data as  listed  CMP Latest Ref Rng & Units 04/27/2021 01/28/2021 12/04/2020  Glucose 70 - 99 mg/dL 121(H) 99 107(H)  BUN 8 - 23 mg/dL 17 24 28(H)  Creatinine 0.61 - 1.24 mg/dL 1.35(H) 1.47(H) 1.47  Sodium 135 - 145 mmol/L 142 141 141  Potassium 3.5 - 5.1 mmol/L 4.4 4.5 4.4  Chloride 98 - 111 mmol/L 107 104 103  CO2 22 - 32 mmol/L 28 31 31   Calcium 8.9 - 10.3 mg/dL 8.8(L) 9.8 9.4  Total Protein 6.5 - 8.1 g/dL 6.7 - 6.4  Total Bilirubin 0.3 - 1.2 mg/dL 0.4 - 0.7  Alkaline Phos 38 - 126 U/L 88 - 73  AST 15 - 41 U/L 17 - 14  ALT 0 - 44 U/L 20 - 11   CBC Latest Ref Rng & Units 04/27/2021 10/27/2020 07/31/2020  WBC 4.0 - 10.5 K/uL 10.1 9.4 8.4  Hemoglobin 13.0 - 17.0 g/dL 12.8(L) 12.5(L) 12.5(L)  Hematocrit 39.0 - 52.0 % 38.3(L) 37.1(L) 37.4(L)  Platelets 150 - 400 K/uL 231 234 221.0     Surgical Pathology  CASE: [id number]  PATIENT: Deven Donate  Bone Marrow Report   Clinical History: MGUS, left posterior iliac crest, (ADC)   DIAGNOSIS:   BONE MARROW, ASPIRATE, CLOT, CORE:  - Plasma cell myeloma, see comment.  - No amyloid deposits.  - Minimal iron stores.   PERIPHERAL BLOOD:  - Normocytic anemia.   COMMENT:   The marrow is mildly hypercellular with increased monotypic plasma cells  (9% aspirate, 10-15% CD138 immunohistochemistry). There are no amyloid  deposits seen with Congo red, although there is a lack of larger vessels  in the core biopsy. The findings are consistent with plasma cell  myeloma.   MICROSCOPIC DESCRIPTION:   PERIPHERAL BLOOD SMEAR: There is a normocytic anemia with scattered  elliptocytes/ovalocytes.  There is no rouleaux formation.  Leukocytes  are present in normal numbers.  Circulating plasma cells are not  identified.  Platelets are present in normal numbers.   BONE MARROW ASPIRATE: Spicular cellular.  Erythroid precursors: Present in appropriate proportions.  No  significant dysplasia.  Granulocytic precursors: Present in appropriate proportions.   No  significant dysplasia.  No increase in blasts.  Megakaryocytes: Present and largely unremarkable.  Lymphocytes/plasma cells: There is a mild increase in plasma cells (9%  by manual differential counts) with scattered atypical forms (large  forms, binucleation).  Lymphocytes are not increased.   TOUCH PREPARATIONS: Similar to aspirate smears.   CLOT AND BIOPSY: The core biopsies and clot sections are mildly  hypercellular for age (32 to 30%).  CD138 immunohistochemistry reveals  increased plasma cells (10 to 15%) scattered in small clusters.  By  light chain in situ hybridization plasma cells are lambda restricted.  Myeloid and erythroid elements are present in appropriate proportions.  Megakaryocytes exhibit a spectrum of maturation without tight clusters.  There are few benign appearing lymphoid aggregates. Congo red is  negative for amyloid deposits.   IRON STAIN: Iron stains are performed on a bone marrow aspirate or touch  imprint smear and section of clot. The controls stained appropriately.        Storage Iron: Minimal.       Ring Sideroblasts: Absent.   ADDITIONAL DATA/TESTING: Cytogenetics, including FISH for myeloma, was  ordered and will be reported separately.   CELL COUNT DATA:   Bone Marrow count performed on 500 cells shows:  Blasts:   0%   Myeloid:  47%  Promyelocytes: 0%   Erythroid:     23%  Myelocytes:    5%   Lymphocytes:  17%  Metamyelocytes:     0%   Plasma cells:  9%  Bands:    16%  Neutrophils:   22%  M:E ratio:     2.04  Eosinophils:   4%  Basophils:     0%  Monocytes:     4%   Lab Data: CBC performed on 04/23/2019 shows:  WBC: 8.6 k/uL  Neutrophils:   71%  Hgb: 12.4 g/dL Lymphocytes:   25%  HCT: 38.0 %    Monocytes:     3%  MCV: 92.7 fL   Eosinophils:   0%  RDW: 13.8 %    Basophils:     1%  PLT: 221 k/uL   GROSS DESCRIPTION:   A: Aspirate smear   B: The specimen is received in B-plus fixative and consists of a 21.0 x  12.0 x 5.0 mm  aggregate of red-brown clotted blood.  The specimen is  entirely submitted in cassette B.   C: The specimen is received in B-plus fixative and consists of 2 cores  of tan bone, measuring 0.6 and 0.9 cm in length by 0.2 cm in diameter.  The specimen is entirely submitted in cassette C. Craig Staggers 04/25/2019)   Final Diagnosis performed by Vicente Males, MD.   Electronically signed  04/25/2019  Technical and / or Professional components performed at [facility], [place] [address] [place], [place] [address].   Immunohistochemistry Technical component (if applicable) was performed  at [facility]. [address] [place],  [place], [place] [address].   IMMUNOHISTOCHEMISTRY DISCLAIMER (if applicable):  Some of these immunohistochemical stains may have been developed and the  performance characteristics determine by [facility]. Some  may not have been cleared or approved by the U.S. Food and Drug  Administration. The FDA has determined that such clearance or approval  is not necessary. This test is used for clinical purposes. It should not  be regarded as investigational or for research. This laboratory is  certified under the [place]  (CLIA-88) as qualified to perform high complexity clinical laboratory  testing.  The controls stained appropriately.   RADIOGRAPHIC STUDIES: No relevant radiographic studies.   ASSESSMENT & PLAN MANSEL STROTHER 87 y.o. male with medical history significant for CAD s/p PCI in 2012, DM type II, Prostate cancer s/p brachytherapy, and HTN who presents for follow up for his smoldering multiple myeloma.   On exam today [name] continues to do well.  He continues to be physically active and works out on a routine basis.[name]  He continues to be at his baseline level of health with excellent dietary habits and frequent visits to the gym.  He has had no major changes since we last saw  him.  Treatment of smoldering multiple myeloma is a developing area of hematology.  There have been clinical trials showing benefit in the treatment of intermediate to high risk SMM (J Clin Oncol. 2020 Apr 10;38(11):1126-1137).  The current treatment recommendation consists of lenalidomide 25 mg daily for 21 of 28 days.  This therapy is however best suited for younger patients with longer life expectancy is given the long time to progression the average smoldering multiple myeloma patient has.  Review of the literature shows that intermediate risk patients have a time to progression of approximately 5 years (Blood Cancer Journal 8, 59 (2018).  Given [name] advanced age I would recommend close monitoring of his hematological parameters and creatinine/calcium to assure  that his disease is not progressing.  I have shared with [name] these statistics and recommendations.   Previously after detailed discussion [name] was in agreement with continued monitoring of his smoldering multiple myeloma.  He acknowledged since he feels well, therefore chemotherapy would likely inhibit his physical activity and his overall wellbeing.  I am in strong agreement that holding on treatment at this time is a reasonable option.  #Smoldering Multiple Myeloma, Intermediate Risk --confirmed diagnosis based on the bone marrow biopsy, with plasma cells of 10-15% and no CRAB criteria. No evidence of amyloidosis on bone marrow stain.  --treatment of Smoldering multiple myeloma is a newer idea and predominately consists of monotherapy lenalidomide. Using the Mayo 2018 20/2/20 guidelines, [name] is a borderline Intermediate Risk SMM.  --given his advance aged and lack of any CRAB criteria, I would recommend holding on treatment at this time with close clinical monitoring for progression. [name] was in agreement with close continued monitoring.  --will collect SPEP, UPEP, and SFLC on a routine basis -- will order metastatic  survey yearly (next due Nov 2022) --labs today show Cr 1.35, WBC 10.1, Hgb 12.8, MCV 90.3, Plt 231 --RTC in 6 months time.    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  - PSA collected by [facility]  All questions were answered. The patient knows to call the clinic with any problems, questions or concerns.  A total of more than 30 minutes were spent face-to-face with the patient during this encounter and over half of that time was spent on counseling and coordination of care as outlined above.   Ledell Peoples, MD Department of Hematology/Oncology [place] [facility] Phone: [phone] Pager: [phone] Email: [email or web address]  04/27/2021 4:24 PM  Literature Support:  Rene Paci, Ron Parker AM, Buadi FK, Wylie Hail, Matous JV, Anderson DM, Emmons RV, Mahindra A, Wagner LI, Dhodapkar MV, Rajkumar SV. Randomized Trial of Lenalidomide Versus Observation in Smoldering Multiple Myeloma. J Clin Oncol. 2020 Apr 10;38(11):1126-1137. doi: 10.1200/JCO.19.01740. Epub 2019 Oct 25. PMID: 37357897; PMCID: [id number]. --Progression-free survival was significantly longer with lenalidomide compared with observation (hazard ratio, 0.28; 95% CI, 0.12 to 0.62; P = .002). One-, 2-, and 3-year progression-free survival was 98%, 93%, and 91% for the lenalidomide arm versus 89%, 76%, and 66% for the observation arm, respectively.  Lakshman, A., Rajkumar, S.V., Buadi, F.K. et al. Risk stratification of smoldering multiple myeloma incorporating revised IMWG diagnostic criteria. Blood Cancer Journal 8, 59 (2018).  [email or web address] --The median TTP for low-, intermediate-, and high-risk groups were 110, 68, and 29 months, respectively (p?<?0.0001). BMPC%?>?20%, M-protein?>?2?g/dL, and FLCr?>?20 at diagnosis can be used to risk stratify patients with SMM. Patients with high-risk SMM need close follow-up and are candidates for clinical trials aiming to prevent progression.

## 2021-04-28 LAB — KAPPA/LAMBDA LIGHT CHAINS
Kappa free light chain: 38.9 mg/L — ABNORMAL HIGH (ref 3.3–19.4)
Kappa, lambda light chain ratio: 0.08 — ABNORMAL LOW (ref 0.26–1.65)
Lambda free light chains: 492.1 mg/L — ABNORMAL HIGH (ref 5.7–26.3)

## 2021-04-28 LAB — BETA 2 MICROGLOBULIN, SERUM: Beta-2 Microglobulin: 3.6 mg/L — ABNORMAL HIGH (ref 0.6–2.4)

## 2021-04-30 LAB — MULTIPLE MYELOMA PANEL, SERUM
Albumin SerPl Elph-Mcnc: 3.5 g/dL (ref 2.9–4.4)
Albumin/Glob SerPl: 1.3 (ref 0.7–1.7)
Alpha 1: 0.2 g/dL (ref 0.0–0.4)
Alpha2 Glob SerPl Elph-Mcnc: 0.8 g/dL (ref 0.4–1.0)
B-Globulin SerPl Elph-Mcnc: 0.9 g/dL (ref 0.7–1.3)
Gamma Glob SerPl Elph-Mcnc: 0.9 g/dL (ref 0.4–1.8)
Globulin, Total: 2.9 g/dL (ref 2.2–3.9)
IgA: 152 mg/dL (ref 61–437)
IgG (Immunoglobin G), Serum: 948 mg/dL (ref 603–1613)
IgM (Immunoglobulin M), Srm: 54 mg/dL (ref 15–143)
Total Protein ELP: 6.4 g/dL (ref 6.0–8.5)

## 2021-05-01 ENCOUNTER — Telehealth: Payer: Self-pay | Admitting: *Deleted

## 2021-05-01 NOTE — Telephone Encounter (Signed)
-----   Message from Orson Slick, MD sent at 05/01/2021  7:09 AM EST ----- Please let [name] know that his smoldering myeloma labs are stable. We will plan to see him back in May 2023.  ----- Message ----- From: Buel Ream, [facility] [place] Sent: 04/27/2021   2:57 PM EST To: Orson Slick, MD

## 2021-05-01 NOTE — Telephone Encounter (Signed)
TCT patient regarding recent lab results.. No answer but was able to leave vm message stating that labs are stable and we will see him in May 2023

## 2021-05-03 NOTE — Progress Notes (Signed)
Office Visit Note  Patient: [name]             Date of Birth: 05/16/1932           MRN: [id number]             PCP: [name], DO Referring: [name], * Visit Date: 05/04/2021   Subjective:  Follow-up (Dong good)   History of Present Illness: [name] is a 87 y.o. male here for follow up for osteoporosis treatment after new alendronate 83m PO weekly start. He had recent labs checked last week including CBC and CMP and no significant change in renal function was identified. Oncology clinic follow up without new concerns continuing observation. He remains pretty active exercising regularly with stationary bike and reports healthy diet. He had questions about his systolic blood pressure, I briefly reviewed this with him he has a widened pulse pressure and would not recommend much more aggressive targets with diastolic pressure in low 699I  Previous HPI 01/28/21 [name] here for osteoporosis. 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  Bone density testing checked in July demonstrated osteoporosis with the lowest BMD in the left femoral neck -2.8.  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  He denies any recent falls.  He tries to focus on a healthy diet focusing on fresh foods and some organic foods.  He takes a daily vitamin D supplement already. He has no history of kidney stones.  He has no history of thyroid  disease.   Labs reviewed 10/2020 CMP eGFR 42.82 Ca 9.4     12/18/20 Bone densitometry AP Spine L1-L4 -0.8 1.079 g/cm2 Left femoral neck -2.8 0.644 g/cm2 Right forearm radius 33% -1.7 0.817 g/cm2     Review of Systems  Constitutional:  Negative for fatigue.  HENT:  Negative for mouth dryness.   Eyes:  Negative for dryness.  Respiratory:  Negative for shortness of breath.   Cardiovascular:  Negative for swelling in legs/feet.  Gastrointestinal:  Negative for constipation.  Endocrine: Negative for excessive thirst.  Genitourinary:  Negative for difficulty urinating.  Musculoskeletal:  Negative for joint pain and joint pain.  Skin:  Negative for rash.  Allergic/Immunologic: Negative for susceptible to infections.  Neurological:  Negative for numbness.  Hematological:  Negative for bruising/bleeding tendency.  Psychiatric/Behavioral:  Negative for sleep disturbance.    PMFS History:  Patient Active Problem List   Diagnosis Date Noted   Vitamin D deficiency 01/28/2021   Diabetes mellitus ([facility] 04/15/2020   Type 2 diabetes mellitus with stage 3b chronic kidney disease, without long-term current use of insulin ([facility] 02/06/2020   Type 2 diabetes mellitus with diabetic polyneuropathy, without long-term current use of insulin ([facility] 02/06/2020   Uncontrolled type 2 diabetes mellitus with hyperglycemia ([facility] 04/06/2019   Hyperlipidemia associated with type 2 diabetes mellitus ([facility] 04/06/2019   Monoclonal gammopathy 04/06/2019   Osteoporosis with pathological fracture of forearm 03/21/2019   Multiple closed fractures  of ribs of left side 03/21/2019   Primary open angle glaucoma (POAG) of left eye, mild stage 02/04/2019   Primary open angle glaucoma (POAG) of right eye, moderate stage 02/04/2019   Preventative health care 04/01/2017   Prostate cancer ([place]) 02/15/2017   Fuchs' corneal dystrophy 08/18/2016   BPH with urinary obstruction 12/13/2012   CAD (coronary artery disease)  04/09/2011   Gastritis and gastroduodenitis 05/25/2010   COLONIC POLYPS, ADENOMATOUS, HX OF 04/20/2010   ANEMIA 10/27/2009   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, without long-term current use of insulin ([place]) 07/04/2006   Hyperlipidemia 07/04/2006   Essential hypertension 07/04/2006    Past Medical History:  Diagnosis Date   CAD (coronary artery disease)    LHC 03/25/11 by Dr. Burt Knack:  pLAD 99%, oCFX 20-30%, pOM1 40%, dAVCFX 20-30%.  EF was normal on nuclear study.  He was treated with a Promus DES to his pLAD.    Colon polyps 1996   villous adenoma   DM type 2 (diabetes mellitus, type 2) ([place]) 2002   Fuchs' corneal dystrophy    Hyperlipidemia    Hypertension    Macular degeneration    posterior vitreous vitreous detachment   Prostate cancer ([place]) dx 2018    Family History  Problem Relation Age of Onset   Stomach cancer Mother 41   Coronary artery disease Father 55       deceased   Healthy Son    Healthy Son    Healthy Son    Past Surgical History:  Procedure Laterality Date   CARDIAC CATHETERIZATION  03/25/2011   cataract Bilateral 2010   corenea implants  june and sept 2018   [name]   CORONARY STENT PLACEMENT  03/25/2011   RADIOACTIVE SEED IMPLANT N/A 05/06/2017   Procedure: RADIOACTIVE SEED IMPLANT/BRACHYTHERAPY IMPLANT;  Surgeon: Kathie Rhodes, MD;  Location: [facility];  Service: Urology;  Laterality: N/A;   SPACE OAR INSTILLATION N/A 05/06/2017   Procedure: SPACE OAR INSTILLATION;  Surgeon: Kathie Rhodes, MD;  Location: [facility];  Service: Urology;  Laterality: N/A;   Social History   Social History Narrative   Not on file   Immunization History  Administered Date(s) Administered   Fluad Quad(high Dose 65+) 04/06/2019, 04/28/2020   Influenza Split 03/05/2011, 04/24/2012   Influenza Whole 02/27/2008, 03/04/2010   Influenza, High Dose Seasonal PF 04/01/2017, 03/14/2018   Influenza,inj,quad,  With Preservative 03/07/2013   Influenza-Unspecified 03/08/2015   PFIZER(Purple Top)SARS-COV-2 Vaccination 08/13/2019, 09/18/2019, 03/21/2020   Pneumococcal Conjugate-13 08/26/2014   Pneumococcal Polysaccharide-23 02/27/2008, 07/31/2020     Objective: Vital Signs: BP (!) 162/61 (BP Location: Left Arm, Patient Position: Sitting, Cuff Size: Normal)   Pulse 65   Resp 16   Ht _0  (1.626 m)   Wt 128 lb (58.1 kg)   BMI 21.97 kg/m    Physical Exam Cardiovascular:     Rate and Rhythm: Normal rate and regular rhythm.  Pulmonary:     Effort: Pulmonary effort is normal.     Comments: Basilar inspiratory crackles b/l Skin:    General: Skin is warm and dry.     Findings: No rash.  Neurological:     Mental Status: He is alert.  Psychiatric:        Mood and Affect: Mood normal.     Musculoskeletal Exam:  Elbows full ROM no tenderness or swelling Wrists full ROM no tenderness or swelling Fingers full ROM no tenderness or swelling Knees full ROM  no tenderness or swelling   Investigation: No additional findings.  Imaging: No results found.  Recent Labs: Lab Results  Component Value Date   WBC 10.1 04/27/2021   HGB 12.8 (L) 04/27/2021   PLT 231 04/27/2021   NA 142 04/27/2021   K 4.4 04/27/2021   CL 107 04/27/2021   CO2 28 04/27/2021   GLUCOSE 121 (H) 04/27/2021   BUN 17 04/27/2021   CREATININE 1.35 (H) 04/27/2021   BILITOT 0.4 04/27/2021   ALKPHOS 88 04/27/2021   AST 17 04/27/2021   ALT 20 04/27/2021   PROT 6.7 04/27/2021   ALBUMIN 3.6 04/27/2021   CALCIUM 8.8 (L) 04/27/2021   GFRAA 45 (L) 10/26/2019    Speciality Comments: No specialty comments available.  Procedures:  No procedures performed Allergies: Patient has no known allergies.   Assessment / Plan:     Visit Diagnoses: Osteoporosis with pathological fracture of left forearm with routine healing, subsequent encounter - Plan: alendronate (FOSAMAX) 70 MG tablet  Doing well with alendronate so far. He  apparently has repeat bone density teting coming up soon I will look out for this but is much too early for assessing clinical response to treatment. Plan to continue alendronate 70 mg PO weekly can f/u in 1 year   Gastritis and gastroduodenitis  No GI symptom complains with taking the alendronate.   Orders: No orders of the defined types were placed in this encounter.  Meds ordered this encounter  Medications   alendronate (FOSAMAX) 70 MG tablet    Sig: Take 1 tablet (70 mg total) by mouth once a week. Take with a full glass of water on an empty stomach.    Dispense:  13 tablet    Refill:  3      Follow-Up Instructions: Return in about 1 year (around 05/04/2022) for OP on alendronate f/u 66mo.   CCollier Salina MD  Note - This record has been created using [employer]  Chart creation errors have been sought, but may not always  have been located. Such creation errors do not reflect on  the standard of medical care.

## 2021-05-04 ENCOUNTER — Ambulatory Visit: Payer: Medicare HMO | Admitting: Internal Medicine

## 2021-05-04 ENCOUNTER — Other Ambulatory Visit: Payer: Self-pay

## 2021-05-04 ENCOUNTER — Encounter: Payer: Self-pay | Admitting: Internal Medicine

## 2021-05-04 VITALS — BP 162/61 | HR 65 | Resp 16 | Ht 64.0 in | Wt 128.0 lb

## 2021-05-04 DIAGNOSIS — D472 Monoclonal gammopathy: Secondary | ICD-10-CM | POA: Diagnosis not present

## 2021-05-04 DIAGNOSIS — K2990 Gastroduodenitis, unspecified, without bleeding: Secondary | ICD-10-CM | POA: Diagnosis not present

## 2021-05-04 DIAGNOSIS — M80032D Age-related osteoporosis with current pathological fracture, left forearm, subsequent encounter for fracture with routine healing: Secondary | ICD-10-CM

## 2021-05-04 DIAGNOSIS — K2970 Gastritis, unspecified, without bleeding: Secondary | ICD-10-CM | POA: Diagnosis not present

## 2021-05-04 MED ORDER — ALENDRONATE SODIUM 70 MG PO TABS: 70.0000 mg | ORAL_TABLET | ORAL | 3 refills | Status: DC

## 2021-05-05 ENCOUNTER — Ambulatory Visit (HOSPITAL_COMMUNITY)
Admission: RE | Admit: 2021-05-05 | Discharge: 2021-05-05 | Disposition: A | Discharge status: Home / Self Care | Payer: Medicare HMO | Source: Ambulatory Visit | Attending: Hematology and Oncology | Admitting: Hematology and Oncology

## 2021-05-05 DIAGNOSIS — D472 Monoclonal gammopathy: Secondary | ICD-10-CM | POA: Insufficient documentation

## 2021-05-05 DIAGNOSIS — C9000 Multiple myeloma not having achieved remission: Secondary | ICD-10-CM | POA: Diagnosis not present

## 2021-05-06 ENCOUNTER — Telehealth: Payer: Self-pay | Admitting: *Deleted

## 2021-05-06 NOTE — Telephone Encounter (Signed)
-----   Message from Orson Slick, MD sent at 05/06/2021  4:12 PM EST ----- Please let [name] know that his smoldering MM labs were stable and that his bone survey showed no abnormalities. We will plan to see him back in May 2023.   ----- Message ----- From: Interface, Rad Results In Sent: 05/06/2021   4:05 PM EST To: Orson Slick, MD

## 2021-05-06 NOTE — Telephone Encounter (Signed)
TCT patient regarding recent labs and bone survey. No answer but was able to leave vm message for pt to call back  @ [phone]

## 2021-05-07 ENCOUNTER — Inpatient Hospital Stay: Payer: Medicare HMO | Attending: Hematology and Oncology

## 2021-05-07 ENCOUNTER — Other Ambulatory Visit: Payer: Self-pay

## 2021-05-07 DIAGNOSIS — D472 Monoclonal gammopathy: Secondary | ICD-10-CM | POA: Insufficient documentation

## 2021-05-08 ENCOUNTER — Telehealth: Payer: Self-pay | Admitting: *Deleted

## 2021-05-08 NOTE — Telephone Encounter (Signed)
-----   Message from Orson Slick, MD sent at 05/06/2021  4:12 PM EST ----- Please let [name] know that his smoldering MM labs were stable and that his bone survey showed no abnormalities. We will plan to see him back in May 2023.   ----- Message ----- From: Interface, Rad Results In Sent: 05/06/2021   4:05 PM EST To: Orson Slick, MD

## 2021-05-08 NOTE — Telephone Encounter (Signed)
TCT patient regarding recent labs and bone survey. Spoke with him and advised that his labs are stable and his bone survey did not show any abnormalities other than the known osteopenia.  Pt voiced understanding and is pleased with results.  He is aware of his next appt in May 2023

## 2021-05-11 LAB — UPEP/UIFE/LIGHT CHAINS/TP, 24-HR UR
% BETA, Urine: 10.2 %
ALPHA 1 URINE: 2.7 %
Albumin, U: 48.5 %
Alpha 2, Urine: 3.7 %
Free Kappa Lt Chains,Ur: 129.54 mg/L — ABNORMAL HIGH (ref 1.17–86.46)
Free Kappa/Lambda Ratio: 0.09 — ABNORMAL LOW (ref 1.83–14.26)
Free Lambda Lt Chains,Ur: 1385.17 mg/L — ABNORMAL HIGH (ref 0.27–15.21)
GAMMA GLOBULIN URINE: 34.9 %
M-SPIKE %, Urine: 27.5 % — ABNORMAL HIGH
M-Spike, Mg/24 Hr: 189 mg/24 hr — ABNORMAL HIGH
Total Protein, Urine-Ur/day: 689 mg/24 hr — ABNORMAL HIGH (ref 30–150)
Total Protein, Urine: 91.8 mg/dL
Total Volume: 750

## 2021-06-18 ENCOUNTER — Other Ambulatory Visit: Payer: Self-pay | Admitting: Internal Medicine

## 2021-06-18 DIAGNOSIS — M80032D Age-related osteoporosis with current pathological fracture, left forearm, subsequent encounter for fracture with routine healing: Secondary | ICD-10-CM

## 2021-06-18 NOTE — Telephone Encounter (Signed)
Next Visit: 05/03/2022  Last Visit: 05/04/2021  Last Fill: 05/04/2021 (Sent to local pharmacy) This request is from mail order pharmacy.  DX: Osteoporosis with pathological fracture of left forearm with routine healing, subsequent encounter   Current Dose per office note 05/04/2021: alendronate 70 mg PO weekly   Labs: 04/27/2021  Okay to refill Fosamax?

## 2021-06-23 ENCOUNTER — Ambulatory Visit (INDEPENDENT_AMBULATORY_CARE_PROVIDER_SITE_OTHER): Payer: Medicare HMO

## 2021-06-23 DIAGNOSIS — M810 Age-related osteoporosis without current pathological fracture: Secondary | ICD-10-CM

## 2021-06-23 DIAGNOSIS — I2510 Atherosclerotic heart disease of native coronary artery without angina pectoris: Secondary | ICD-10-CM

## 2021-06-23 DIAGNOSIS — I10 Essential (primary) hypertension: Secondary | ICD-10-CM

## 2021-06-23 DIAGNOSIS — E1142 Type 2 diabetes mellitus with diabetic polyneuropathy: Secondary | ICD-10-CM

## 2021-06-23 DIAGNOSIS — E785 Hyperlipidemia, unspecified: Secondary | ICD-10-CM

## 2021-06-23 MED ORDER — METOPROLOL SUCCINATE ER 50 MG PO TB24: 50.0000 mg | ORAL_TABLET | Freq: Every day | ORAL | 1 refills | Status: DC

## 2021-06-23 NOTE — Chronic Care Management (AMB) (Signed)
Chronic Care Management Pharmacy Note  06/25/2021 Name:  [name] MRN:  [id number] DOB:  [birth year]  Summary:  Patient has not been taking metoprolol. States he forgot to refill. He reports home blood pressure has been above goal recently. Today was 160/66.  Discussed adherence and tips for improving adherence.  Updated prescription for metoprolol sent to [facility].   Subjective: [name] is an 88 y.o. year old male who is a primary patient of [name], DO.  The CCM team was consulted for assistance with disease management and care coordination needs.    Engaged with patient by telephone for follow up visit in response to provider referral for pharmacy case management and/or care coordination services.   Consent to Services:  The patient was given information about Chronic Care Management services, agreed to services, and gave verbal consent prior to initiation of services.  Please see initial visit note for detailed documentation.   Patient Care Team: Carollee Herter, Alferd Apa, DO as PCP - General (Family Medicine) Kathie Rhodes, MD (Inactive) as Consulting Physician (Urology) Tyler Pita, MD as Consulting Physician (Radiation Oncology) Marilynne Halsted, MD as Referring Physician (Ophthalmology) Orson Slick, MD as Consulting Physician (Hematology and Oncology) [facility], Melanie Crazier, MD as Consulting Physician (Endocrinology) Robley Fries, MD as Consulting Physician (Urology) Madelon Lips, MD as Consulting Physician (Nephrology) Cherre Robins, RPH-CPP (Pharmacist)  Recent office visits: 12/04/2020 - PCP (Dr Etter Sjogren) F/U BP and cholesterol; Ordered DEXA; no med changes.  08/21/2020 - PCP (Dr Etter Sjogren) BP noted to be above goal. Started amlodipien 2.29m daily.   Recent consult visits: 05/04/2021 - Rheum (Dr RBenjamine Mola F/U osteoporosis. No med changes. 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 Labs checked. No med changes.  02/17/2021 - Endo (Dr SKelton Pillar follow up type 2 DM. Noted to have occasional hypoglycemia. Decreased dose of glimepiridie to 161mdaily.  01/29/2021 - Ophthalmology (Dr BoEdilia BoFollow up primary open angle glaucoma of both eyes. Continue Combigan 2 drops OU daily 01/28/2021 - Rheumatology (Dr RiBenjamine MolaF/U osteoporosis with left wrist fracture 2020. Lowest T-Score = -2.8 at left femoral neck. Started alendronated 708meekly. Labs checked PTH and vitamin D WNL.   Hospital visits: None in past 6 months  Objective:  Lab Results  Component Value Date   CREATININE 1.35 (H) 04/27/2021   CREATININE 1.47 (H) 01/28/2021   CREATININE 1.47 12/04/2020    Lab Results  Component Value Date   HGBA1C 7.1 (A) 02/17/2021   Last diabetic Eye exam:  Lab Results  Component Value Date/Time   HMDIABEYEEXA No Retinopathy 08/28/2015 12:00 AM    Last diabetic Foot exam:  Lab Results  Component Value Date/Time   HMDIABFOOTEX done 10/23/2013 12:00 AM        Component Value Date/Time   CHOL 109 12/04/2020 0910   TRIG 84.0 12/04/2020 0910   HDL 36.80 (L) 12/04/2020 0910   CHOLHDL 3 12/04/2020 0910   VLDL 16.8 12/04/2020 0910   LDLCALC 56 12/04/2020 0910    Hepatic Function Latest Ref Rng & Units 04/27/2021 12/04/2020 10/27/2020  Total Protein 6.5 - 8.1 g/dL 6.7 6.4 6.9  Albumin 3.5 - 5.0 g/dL 3.6 4.0 3.8  AST 15 - 41 U/L 17 14 13(L)  ALT 0 - 44 U/L _0 Alk Phosphatase 38 - 126 U/L 88 73 79  Total Bilirubin 0.3 - 1.2 mg/dL 0.4 0.7 0.8  Bilirubin,  Direct 0.0 - 0.3 mg/dL - - -    Lab Results  Component Value Date/Time   TSH 2.47 10/20/2009 08:42 AM    CBC Latest Ref Rng & Units 04/27/2021 10/27/2020 07/31/2020  WBC 4.0 - 10.5 K/uL 10.1 9.4 8.4  Hemoglobin 13.0 - 17.0 g/dL 12.8(L) 12.5(L) 12.5(L)  Hematocrit 39.0 - 52.0 % 38.3(L) 37.1(L) 37.4(L)  Platelets 150 - 400 K/uL 231 234 221.0    Lab Results  Component Value Date/Time    VD25OH 80 01/28/2021 08:40 AM    Clinical ASCVD: Yes  The ASCVD Risk score (Arnett DK, et al., 2019) failed to calculate for the following reasons:   The 2019 ASCVD risk score is only valid for ages 58 to 55     Social History   Tobacco Use  Smoking Status Former   Packs/day: 0.25   Years: 3.00   Pack years: 0.75   Types: Cigarettes   Quit date: 06/07/1958   Years since quitting: 63.0  Smokeless Tobacco Never   BP Readings from Last 3 Encounters:  05/04/21 (!) 162/61  04/27/21 (!) 162/85  02/17/21 124/76   Pulse Readings from Last 3 Encounters:  05/04/21 65  04/27/21 64  02/17/21 80   Wt Readings from Last 3 Encounters:  05/04/21 128 lb (58.1 kg)  04/27/21 128 lb 9.6 oz (58.3 kg)  02/17/21 126 lb 6.4 oz (57.3 kg)    Assessment: Review of patient past medical history, allergies, medications, health status, including review of consultants reports, laboratory and other test data, was performed as part of comprehensive evaluation and provision of chronic care management services.   SDOH:  (Social Determinants of Health) assessments and interventions performed:  SDOH Interventions    Flowsheet Row Most Recent Value  SDOH Interventions   Food Insecurity Interventions Intervention Not Indicated  Financial Strain Interventions Intervention Not Indicated  Physical Activity Interventions Intervention Not Indicated  Transportation Interventions Intervention Not Indicated        CCM Care Plan  No Known Allergies  Medications Reviewed Today     Reviewed by Cherre Robins, RPH-CPP (Pharmacist) on 06/25/21 at 2121  Med List Status: <None>   Medication Order Taking? Sig Documenting Provider Last Dose Status Informant  Alcohol Swabs PADS [id number] Yes Use as directed once a day [name], DO Taking Active   alendronate (FOSAMAX) 70 MG tablet [id number] Yes TAKE 1 TABLET (70 MG TOTAL) ONCE A WEEK. TAKE WITH A FULL GLASS OF WATER ON AN EMPTY STOMACH. Collier Salina, MD Taking Active   amLODipine (NORVASC) 2.5 MG tablet [id number] Yes Take 1 tablet (2.5 mg total) by mouth daily. Roma Schanz R, DO Taking Active   aspirin 81 MG tablet [id number] Yes Take 81 mg by mouth daily. [provider] Taking Active Self  atorvastatin (LIPITOR) 20 MG tablet [id number] Yes Take 1 tablet (20 mg total) by mouth daily. Roma Schanz R, DO Taking Active   Blood Glucose Calibration (TRUE METRIX LEVEL 1) Low SOLN [id number] Yes USE AS DIRECTED [name], Melanie Crazier, MD Taking Active   Blood Glucose Calibration (TRUE METRIX LEVEL 3) High SOLN [id number] Yes Use to check controls on glucometer strips every 30 days or with each new bottle of strips (whichever comes first). Roma Schanz R, DO Taking Active   Blood Glucose Monitoring Suppl (TRUE METRIX METER) w/Device Drucie Opitz [id number] Yes USE AS DIRECTED ONCE A DAY [name], Melanie Crazier, MD Taking Active   brimonidine-timolol (COMBIGAN) 0.2-0.5 % ophthalmic solution [id number]  Yes Place 1 drop into both eyes 2 (two) times daily. [provider] Taking Active Self  COLLAGEN PO [id number] Yes Take by mouth. 1 scoop daily [provider] Taking Active   fish oil-omega-3 fatty acids 1000 MG capsule [id number] Yes Take 2 g by mouth daily. [provider] Taking Active Self  glimepiride (AMARYL) 1 MG tablet [id number] Yes Take 1 tablet (1 mg total) by mouth daily with breakfast. [name], Melanie Crazier, MD Taking Active   glucose blood (TRUE METRIX BLOOD GLUCOSE TEST) test strip [id number] Yes USE AS DIRECTED ONCE A DAY Carollee Herter, Alferd Apa, DO Taking Active   losartan-hydrochlorothiazide (HYZAAR) 100-25 MG tablet [id number] Yes Take 1 tablet by mouth daily. [name], DO Taking Active   metoprolol succinate (TOPROL-XL) 50 MG 24 hr tablet [id number] No Take 1 tablet (50 mg total) by mouth daily. TAKE WITH OR IMMEDIATELY FOLLOWING A MEAL.  Patient not taking:  Reported on 06/25/2021   [name], DO Not Taking Active   Multiple Vitamin (MULTIVITAMIN) tablet [id number] Yes Take 1 tablet by mouth daily. [provider] Taking Active Self  SitaGLIPtin-MetFORMIN HCl (JANUMET XR) 50-1000 MG TB24 [id number] Yes Take 2 tablets by mouth daily. [name], Melanie Crazier, MD Taking Active   tamsulosin (FLOMAX) 0.4 MG CAPS capsule [id number] Yes TAKE 1 CAPSULE (0.4 MG TOTAL) BY MOUTH DAILY. [name], DO Taking Active   TRUEplus Lancets 33G [place] [id number] Yes Use to check blood sugar once a day.  DX  E11.9 [name], DO Taking Active             Patient Active Problem List   Diagnosis Date Noted   Vitamin D deficiency 01/28/2021   Diabetes mellitus ([place]) 04/15/2020   Type 2 diabetes mellitus with stage 3b chronic kidney disease, without long-term current use of insulin ([place]) 02/06/2020   Type 2 diabetes mellitus with diabetic polyneuropathy, without long-term current use of insulin ([place]) 02/06/2020   Uncontrolled type 2 diabetes mellitus with hyperglycemia ([place]) 04/06/2019   Hyperlipidemia associated with type 2 diabetes mellitus ([place]) 04/06/2019   Monoclonal gammopathy 04/06/2019   Osteoporosis with pathological fracture of forearm 03/21/2019   Multiple closed fractures of ribs of left side 03/21/2019   Primary open angle glaucoma (POAG) of left eye, mild stage 02/04/2019   Primary open angle glaucoma (POAG) of right eye, moderate stage 02/04/2019   Preventative health care 04/01/2017   Prostate cancer ([place]) 02/15/2017   Fuchs' corneal dystrophy 08/18/2016   BPH with urinary obstruction 12/13/2012   CAD (coronary artery disease) 04/09/2011   Gastritis and gastroduodenitis 05/25/2010   COLONIC POLYPS, ADENOMATOUS, HX OF 04/20/2010   ANEMIA 10/27/2009   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, without long-term current use of insulin ([place]) 07/04/2006   Hyperlipidemia 07/04/2006    Essential hypertension 07/04/2006    Immunization History  Administered Date(s) Administered   Fluad Quad(high Dose 65+) 04/06/2019, 04/28/2020   Influenza Split 03/05/2011, 04/24/2012   Influenza Whole 02/27/2008, 03/04/2010   Influenza, High Dose Seasonal PF 04/01/2017, 03/14/2018   Influenza,inj,quad, With Preservative 03/07/2013   Influenza-Unspecified 03/08/2015   PFIZER(Purple Top)SARS-COV-2 Vaccination 08/13/2019, 09/18/2019, 03/21/2020   Pneumococcal Conjugate-13 08/26/2014   Pneumococcal Polysaccharide-23 02/27/2008, 07/31/2020    Conditions to be addressed/monitored: CAD, HTN, HLD, DMII, CKD Stage 3b, and BPH, h/o prostate cancer; multiple myeloma; glaucoma  Care Plan : General Pharmacy (Adult)  Updates made by Cherre Robins, RPH-CPP since 06/25/2021 12:00 AM  Problem: HTN, HLD, DM   Priority: High  Onset Date: 08/15/2020     Long-Range Goal: Provide education, support and care coordination for medication therapy and chronic conditions   Start Date: 08/15/2020  Expected End Date: 02/15/2021  Recent Progress: On track  Priority: High  Note:   Current Barriers:  Not taking medications as prescribed. Patient education regarding BG goals and glucometer needed.   Pharmacist Clinical Goal(s):  Over the next 90 days, patient will achieve adherence to monitoring guidelines and medication adherence to achieve therapeutic efficacy maintain control of blood pressure and blood sugar as evidenced by home monitoring  adhere to prescribed medication regimen as evidenced by fill dates through collaboration with PharmD and provider.   Interventions: 1:1 collaboration with Carollee Herter, Alferd Apa, DO regarding development and update of comprehensive plan of care as evidenced by provider attestation and co-signature Inter-disciplinary care team collaboration (see longitudinal plan of care) Comprehensive medication review performed; medication list updated in electronic medical  record  Hypertension (BP goal <130/80) Controlled Current treatment: Amlodipine 2.43m daily Losartan-hydrochlorothiazide 100-25 mg daily  Metoprolol succinate ER 567mdaily (has not been taking recently) Medications previously tried: hydralazine (d/c post hospital) Current home readings:  blood pressure was 160/66 today Current exercise habits: Runs or stationary bike daily; lifts 20lb weight for arm exercise daily and does squats daily; also push mows own lawn and grows 50 organic tomato plants each summer.  Denies hypotensive/hypertensive symptoms; denies edema Interventions:  Educated on BP goals and benefits of medications for prevention of heart attack, stroke and kidney damage; Recommended continue to check blood pressure 2 to 3 times per week, document, and provide log at future appointments Restart metoprolol - updated prescription sent to pharmacy today.  Hyperlipidemia / CAD: (LDL goal < 70) Controlled Current treatment: Aspirin 81 mg daily Atorvastatin 20 mg daily Fish Oil 2g daily Medications previously tried: simvastatin (amlodipine interaction) Current exercise habits: see above Interventions:  Reviewed cholesterol goals;  Benefits of statin for ASCVD risk reduction;  Recommended to continue current medication  Diabetes (A1c goal <7%) Managed by endo - Dr ShKelton Pillarast A1c 7.1%  Current medications: Glimepiride 55m34maily  Janumet 50-1000m84mice daily Medications previously tried: glimepiride 4mg 30mly (hypoglycemia)  Current home glucose readings range from 90 to 135. Denies hypoglycemic/hyperglycemic symptoms; Denies blood glucose less than 80 Noted to have macrovascular and neuropathic complications  Current exercise: see above Current diet: breakfast is usually avocado, 1 egg and 1 slice of toast; Lunch - sandwich or vegetables; Dinner - eats out with his son most nightsbut usually salad, vegetable and either chicken, lamb or fish.  Interventions:   Reviewed home blood glucose readings and reviewed goals  Fasting blood glucose goal (before meals) = 80 to 130 Blood glucose goal after a meal = less than 180  Reviewed how to use control solution to check to see that glucometer is accurate Continue to check blood glucose  once a day Future options could be to use SLGT2 since patient has CKD 3b Recommended to continue current medication  Great job with exercise - continue current activity level.   CKD - stage 3B: Goal: avoid nephrotoxic medications and slow CKD progression [place] Dr UptonHollie Salkt visit 05/2020) Lab Results  Component Value Date   CREATININE 1.35 (H) 04/27/2021   CREATININE 1.47 (H) 01/28/2021   CREATININE 1.47 12/04/2020  Interventions:  Reviewed med list for meds that might need adjustment based on Scr / eGFR No change  in metformin dose currently but will continue to monitor.  Patient endorsed that he does not take any NSAIDs   Osteoporosis: Goal: Reduce risk of fracture due to osteopenia/osteoporosis Managed by rheumatologist left wrist fracture 2020.  Dexa done 2022: Lowest T-Score = -2.8 at left femoral neck.  Last PTH and vitamin D WNL. Current regimen:  Alendronate 16m - take 1 tablet once a week - take on an empty stomach with a full glass of water in the morning at least 30 minutes before eating or taking other medications or drinking anything other than water.  Interventions: Consider daily intake of 1204mof calcium daily through diet or supplementation  Intake of 120022mf calcium daily through diet and/or supplementation Continue alendronate  Medication management Pharmacist Clinical Goal(s): Over the next 90 days, patient will work with PharmD and providers to maintain optimal medication adherence [facility] Comprehensive medication review performed. Reviewed refill history and assessed adherence Continue current  medication management strategy   Patient Goals/Self-Care Activities Over the next 90 days, patient will:  take medications as prescribed  check glucose daily, document, and provide at future appointments check blood pressure 2 to 3 times per week, document, and provide at future appointments target a minimum of 150 minutes of moderate intensity exercise weekly Restart metoprolol for blood pressure   Follow Up Plan: The care management team will reach out to the patient again over the next 90 days.        Medication Assistance: None required.  Patient affirms current coverage meets needs.  [facility]  [id number]: [phone]: [phone] -[id number]: [phone]: [phone]itTalenth[id number]: [phone]: [phone] Up:  Patient agrees to Care Plan and Follow-up.  Plan: Telephone follow up appointment with care management team member scheduled for:  2 to 3 weeks to check blood pressure after restarting metoprolol   TammCherre RobinsarmD Clinical Pharmacist [place]-[phone]

## 2021-06-25 NOTE — Patient Instructions (Addendum)
Mr. Feagans It was a pleasure speaking with you today.  I have attached a summary of our visit today and information about your health goals.   Patient Goals/Self-Care Activities take medications as prescribed  check glucose daily, document, and provide at future appointments check blood pressure 2 to 3 times per week, document, and provide at future appointments target a minimum of 150 minutes of moderate intensity exercise weekly Restart metoprolol for blood pressure - prescription has bee send to [facility] [place] next appointment is by telephone on July 13, 2021  at 2:30pm to check blood pressure  Please call the care guide team at [phone] if you need to cancel or reschedule your appointment.    If you have any questions or concerns, please feel free to contact me either at the phone number below or with a MyChart message.   Keep up the good work!  Cherre Robins, PharmD Clinical Pharmacist [facility] [phone] (direct line)  [phone] (main office number)  CARE PLAN ENTRY   Hypertension BP Readings from Last 3 Encounters:  05/04/21 (!) 162/61  04/27/21 (!) 162/85  02/17/21 124/76   Pharmacist Clinical Goal(s): Over the next 90 days, patient will work with PharmD and providers to achieve BP goal <130/80 Current regimen:  Amlodipine 2.5mg  daily  Losartan/hydrochlorothiazide  100-25 mg daily  Metoprolol ER 50mg  daily  Interventions: Requested patient to check his blood pressure 2 to 3 times per week and record Discussed blood pressure goal and importance of monitoring blood pressure  Sent in updated prescription for metoprolol ER Patient self care activities - Over the next 90 days, patient will: Check blood pressure 2 to 3 times per week, document, and provide at future appointments Ensure daily salt intake < 2300 mg/day Continue current blood pressure medications Restart metoprolol ER 50 mg  daily   Hyperlipidemia Lab Results  Component Value Date/Time   LDLCALC 56 12/04/2020 09:10 AM   Pharmacist Clinical Goal(s): Over the next 90 days, patient will work with PharmD and providers to maintain LDL goal < 70 Current regimen:  Atorvastatin 20mg  daily Interventions: Discussed importance of medication adherence  Patient self care activities - Over the next 90 days, patient will: Maintain cholesterol medication regimen.   Diabetes Lab Results  Component Value Date/Time   HGBA1C 7.1 (A) 02/17/2021 08:29 AM   HGBA1C 7.2 (H) 07/31/2020 09:58 AM   HGBA1C 6.6 (A) 04/15/2020 08:17 AM   HGBA1C 9.6 (H) 01/15/2020 09:58 AM   Pharmacist Clinical Goal(s): Over the next 90 days, patient will work with PharmD and providers to maintain A1c goal <7% Current regimen:  Glimepiride 2mg  daily Janumet 50-1000mg  twice daily Interventions: Requested patient to check blood sugar daily and record Reviewed home blood glucose readings and reviewed goals  Fasting blood glucose goal (before meals) = 80 to 130 Blood glucose goal after a meal = less than 180  Patient self care activities - Over the next 90 days, patient will: Check blood sugar once daily, document, and provide at future appointments Contact provider with any episodes of hypoglycemia (blood glucose less than 80) Great job with exercise - continue current activity level.  Continue current medications to lower blood glucose  Osteoporosis: Pharmacist Clinical Goal(s) Over the next 90 days, patient will work with PharmD and providers to reduce risk of fracture due to osteopenia/osteoporosis Current regimen:  Alendronate 70mg  - take 1 tablet once a week - take on an empty stomach with a full  glass of water in the morning at least 30 minutes before eating or taking other medications or drinking anything other than water.  Interventions: Consider daily intake of 1200mg  of calcium daily through diet or supplementation Consider daily  intake of [phone] units of vitamin D through supplementation  Patient self care activities - Over the next 90 days, patient will: Increase intake to 1200mg  of calcium daily through diet and/or supplementation Increase intake to [phone] units of vitamin D daily through supplementation Continue alendronate  Medication management Pharmacist Clinical Goal(s): Over the next 90 days, patient will work with PharmD and providers to achieve optimal medication adherence [facility] Comprehensive medication review performed. Continue current medication management strategy Patient self care activities - Over the next 90 days, patient will: Focus on medication adherence by filling and taking medications appropriately  Take medications as prescribed Report any questions or concerns to PharmD and/or provider(s)     Patient verbalizes understanding of instructions and care plan provided today and agrees to view in [place]. Active MyChart status confirmed with patient.

## 2021-07-07 DIAGNOSIS — E1142 Type 2 diabetes mellitus with diabetic polyneuropathy: Secondary | ICD-10-CM

## 2021-07-07 DIAGNOSIS — E785 Hyperlipidemia, unspecified: Secondary | ICD-10-CM | POA: Diagnosis not present

## 2021-07-07 DIAGNOSIS — I10 Essential (primary) hypertension: Secondary | ICD-10-CM | POA: Diagnosis not present

## 2021-07-07 DIAGNOSIS — M810 Age-related osteoporosis without current pathological fracture: Secondary | ICD-10-CM | POA: Diagnosis not present

## 2021-07-07 DIAGNOSIS — I2510 Atherosclerotic heart disease of native coronary artery without angina pectoris: Secondary | ICD-10-CM | POA: Diagnosis not present

## 2021-07-13 ENCOUNTER — Ambulatory Visit (INDEPENDENT_AMBULATORY_CARE_PROVIDER_SITE_OTHER): Payer: Medicare HMO | Admitting: Pharmacist

## 2021-07-13 DIAGNOSIS — M810 Age-related osteoporosis without current pathological fracture: Secondary | ICD-10-CM

## 2021-07-13 DIAGNOSIS — I2510 Atherosclerotic heart disease of native coronary artery without angina pectoris: Secondary | ICD-10-CM

## 2021-07-13 DIAGNOSIS — E1142 Type 2 diabetes mellitus with diabetic polyneuropathy: Secondary | ICD-10-CM

## 2021-07-13 DIAGNOSIS — E785 Hyperlipidemia, unspecified: Secondary | ICD-10-CM

## 2021-07-13 DIAGNOSIS — I10 Essential (primary) hypertension: Secondary | ICD-10-CM

## 2021-07-13 NOTE — Chronic Care Management (AMB) (Signed)
Chronic Care Management Pharmacy Note  07/13/2021 Name:  [name] MRN:  [id number] DOB:  [birth year]  Summary:  Patient has not restarted metoprolol succinate. States he has not received delivery from [facility]. Called [facility] and tracked package. Package was delivered. After looking patient was able to locate and will restart today.  I will continue to follow adherence - patient is due several refills but he reviewed each medication today and states he has plenty of everything.  Patient has follow up with PCP later this month. Will check lab and blood pressure form visit   Subjective: [name] is an 88 y.o. year old male who is a primary patient of [name], DO.  The CCM team was consulted for assistance with disease management and care coordination needs.    Engaged with patient by telephone for follow up visit in response to provider referral for pharmacy case management and/or care coordination services.   Consent to Services:  The patient was given information about Chronic Care Management services, agreed to services, and gave verbal consent prior to initiation of services.  Please see initial visit note for detailed documentation.   Patient Care Team: Carollee Herter, Alferd Apa, DO as PCP - General (Family Medicine) Kathie Rhodes, MD (Inactive) as Consulting Physician (Urology) Tyler Pita, MD as Consulting Physician (Radiation Oncology) Marilynne Halsted, MD as Referring Physician (Ophthalmology) Orson Slick, MD as Consulting Physician (Hematology and Oncology) [facility], Melanie Crazier, MD as Consulting Physician (Endocrinology) Robley Fries, MD as Consulting Physician (Urology) Madelon Lips, MD as Consulting Physician (Nephrology) Cherre Robins, RPH-CPP (Pharmacist)  Recent office visits: 12/04/2020 - PCP (Dr Etter Sjogren) F/U BP and cholesterol; Ordered DEXA; no med changes.  08/21/2020 - PCP (Dr Etter Sjogren) BP noted to be above goal. Started  amlodipien 2.45m daily.   Recent consult visits: 05/04/2021 - Rheum (Dr RBenjamine Mola F/U osteoporosis. No med changes. 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 Labs checked. No med changes.  02/17/2021 - Endo (Dr SKelton Pillar follow up type 2 DM. Noted to have occasional hypoglycemia. Decreased dose of glimepiridie to 140mdaily.  01/29/2021 - Ophthalmology (Dr BoEdilia BoFollow up primary open angle glaucoma of both eyes. Continue Combigan 2 drops OU daily 01/28/2021 - Rheumatology (Dr RiBenjamine MolaF/U osteoporosis with left wrist fracture 2020. Lowest T-Score = -2.8 at left femoral neck. Started alendronated 7035meekly. Labs checked PTH and vitamin D WNL.   Hospital visits: None in past 6 months  Objective:  Lab Results  Component Value Date   CREATININE 1.35 (H) 04/27/2021   CREATININE 1.47 (H) 01/28/2021   CREATININE 1.47 12/04/2020    Lab Results  Component Value Date   HGBA1C 7.1 (A) 02/17/2021   Last diabetic Eye exam:  Lab Results  Component Value Date/Time   HMDIABEYEEXA No Retinopathy 08/28/2015 12:00 AM    Last diabetic Foot exam:  Lab Results  Component Value Date/Time   HMDIABFOOTEX done 10/23/2013 12:00 AM        Component Value Date/Time   CHOL 109 12/04/2020 0910   TRIG 84.0 12/04/2020 0910   HDL 36.80 (L) 12/04/2020 0910   CHOLHDL 3 12/04/2020 0910   VLDL 16.8 12/04/2020 0910   LDLCALC 56 12/04/2020 0910    Hepatic Function Latest Ref Rng & Units 04/27/2021 12/04/2020 10/27/2020  Total Protein 6.5 - 8.1 g/dL 6.7 6.4 6.9  Albumin 3.5 - 5.0 g/dL 3.6 4.0 3.8  AST 15 - 41  U/L 17 14 13(L)  ALT 0 - 44 U/L _0 Alk Phosphatase 38 - 126 U/L 88 73 79  Total Bilirubin 0.3 - 1.2 mg/dL 0.4 0.7 0.8  Bilirubin, Direct 0.0 - 0.3 mg/dL - - -    Lab Results  Component Value Date/Time   TSH 2.47 10/20/2009 08:42 AM    CBC Latest Ref Rng & Units 04/27/2021 10/27/2020 07/31/2020  WBC 4.0 - 10.5 K/uL 10.1 9.4  8.4  Hemoglobin 13.0 - 17.0 g/dL 12.8(L) 12.5(L) 12.5(L)  Hematocrit 39.0 - 52.0 % 38.3(L) 37.1(L) 37.4(L)  Platelets 150 - 400 K/uL 231 234 221.0    Lab Results  Component Value Date/Time   VD25OH 80 01/28/2021 08:40 AM    Clinical ASCVD: Yes  The ASCVD Risk score (Arnett DK, et al., 2019) failed to calculate for the following reasons:   The 2019 ASCVD risk score is only valid for ages 38 to 80     Social History   Tobacco Use  Smoking Status Former   Packs/day: 0.25   Years: 3.00   Pack years: 0.75   Types: Cigarettes   Quit date: 06/07/1958   Years since quitting: 63.1  Smokeless Tobacco Never   BP Readings from Last 3 Encounters:  05/04/21 (!) 162/61  04/27/21 (!) 162/85  02/17/21 124/76   Pulse Readings from Last 3 Encounters:  05/04/21 65  04/27/21 64  02/17/21 80   Wt Readings from Last 3 Encounters:  05/04/21 128 lb (58.1 kg)  04/27/21 128 lb 9.6 oz (58.3 kg)  02/17/21 126 lb 6.4 oz (57.3 kg)    Assessment: Review of patient past medical history, allergies, medications, health status, including review of consultants reports, laboratory and other test data, was performed as part of comprehensive evaluation and provision of chronic care management services.   SDOH:  (Social Determinants of Health) assessments and interventions performed:      CCM Care Plan  No Known Allergies  Medications Reviewed Today     Reviewed by Cherre Robins, RPH-CPP (Pharmacist) on 07/13/21 at 1457  Med List Status: <None>   Medication Order Taking? Sig Documenting Provider Last Dose Status Informant  Alcohol Swabs PADS [id number] Yes Use as directed once a day [name], DO Taking Active   alendronate (FOSAMAX) 70 MG tablet [id number] Yes TAKE 1 TABLET (70 MG TOTAL) ONCE A WEEK. TAKE WITH A FULL GLASS OF WATER ON AN EMPTY STOMACH. Collier Salina, MD Taking Active   amLODipine (NORVASC) 2.5 MG tablet [id number] Yes Take 1 tablet (2.5 mg total) by mouth daily.  Roma Schanz R, DO Taking Active   aspirin 81 MG tablet [id number] Yes Take 81 mg by mouth daily. [provider] Taking Active Self  atorvastatin (LIPITOR) 20 MG tablet [id number] Yes Take 1 tablet (20 mg total) by mouth daily. Roma Schanz R, DO Taking Active   Blood Glucose Calibration (TRUE METRIX LEVEL 1) Low SOLN [id number] Yes USE AS DIRECTED [name], Melanie Crazier, MD Taking Active   Blood Glucose Calibration (TRUE METRIX LEVEL 3) High SOLN [id number] Yes Use to check controls on glucometer strips every 30 days or with each new bottle of strips (whichever comes first). Roma Schanz R, DO Taking Active   Blood Glucose Monitoring Suppl (TRUE METRIX METER) w/Device Drucie Opitz [id number] Yes USE AS DIRECTED ONCE A DAY [name], Melanie Crazier, MD Taking Active   brimonidine-timolol (COMBIGAN) 0.2-0.5 % ophthalmic solution [id number] Yes Place 1 drop into both eyes 2 (two)  times daily. [provider] Taking Active Self  COLLAGEN PO [id number] Yes Take by mouth. 1 scoop daily [provider] Taking Active   fish oil-omega-3 fatty acids 1000 MG capsule [id number] Yes Take 2 g by mouth daily. [provider] Taking Active Self  glimepiride (AMARYL) 1 MG tablet [id number] Yes Take 1 tablet (1 mg total) by mouth daily with breakfast. [name], Melanie Crazier, MD Taking Active   glucose blood (TRUE METRIX BLOOD GLUCOSE TEST) test strip [id number] Yes USE AS DIRECTED ONCE A DAY Carollee Herter, Alferd Apa, DO Taking Active   losartan-hydrochlorothiazide (HYZAAR) 100-25 MG tablet [id number] Yes Take 1 tablet by mouth daily. [name], DO Taking Active   metoprolol succinate (TOPROL-XL) 50 MG 24 hr tablet [id number]  Take 1 tablet (50 mg total) by mouth daily. TAKE WITH OR IMMEDIATELY FOLLOWING A MEAL.  Patient not taking: Reported on 06/25/2021   [name], DO  Active   Multiple Vitamin (MULTIVITAMIN) tablet [id number] Yes Take 1 tablet by  mouth daily. [provider] Taking Active Self  SitaGLIPtin-MetFORMIN HCl (JANUMET XR) 50-1000 MG TB24 [id number] Yes Take 2 tablets by mouth daily. [name], Melanie Crazier, MD Taking Active   tamsulosin (FLOMAX) 0.4 MG CAPS capsule [id number] No TAKE 1 CAPSULE (0.4 MG TOTAL) BY MOUTH DAILY.  Patient not taking: Reported on 07/13/2021   [name], DO Not Taking Consider Medication Status and Discontinue   TRUEplus Lancets 33G MISC [id number]  Use to check blood sugar once a day.  DX  E11.9 [name], DO  Active             Patient Active Problem List   Diagnosis Date Noted   Vitamin D deficiency 01/28/2021   Diabetes mellitus ([place]) 04/15/2020   Type 2 diabetes mellitus with stage 3b chronic kidney disease, without long-term current use of insulin ([place]) 02/06/2020   Type 2 diabetes mellitus with diabetic polyneuropathy, without long-term current use of insulin ([place]) 02/06/2020   Uncontrolled type 2 diabetes mellitus with hyperglycemia ([place]) 04/06/2019   Hyperlipidemia associated with type 2 diabetes mellitus ([place]) 04/06/2019   Monoclonal gammopathy 04/06/2019   Osteoporosis with pathological fracture of forearm 03/21/2019   Multiple closed fractures of ribs of left side 03/21/2019   Primary open angle glaucoma (POAG) of left eye, mild stage 02/04/2019   Primary open angle glaucoma (POAG) of right eye, moderate stage 02/04/2019   Preventative health care 04/01/2017   Prostate cancer ([place]) 02/15/2017   Fuchs' corneal dystrophy 08/18/2016   BPH with urinary obstruction 12/13/2012   CAD (coronary artery disease) 04/09/2011   Gastritis and gastroduodenitis 05/25/2010   COLONIC POLYPS, ADENOMATOUS, HX OF 04/20/2010   ANEMIA 10/27/2009   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, without long-term current use of insulin ([place]) 07/04/2006   Hyperlipidemia 07/04/2006   Essential hypertension 07/04/2006    Immunization History   Administered Date(s) Administered   Fluad Quad(high Dose 65+) 04/06/2019, 04/28/2020   Influenza Split 03/05/2011, 04/24/2012   Influenza Whole 02/27/2008, 03/04/2010   Influenza, High Dose Seasonal PF 04/01/2017, 03/14/2018   Influenza,inj,quad, With Preservative 03/07/2013   Influenza-Unspecified 03/08/2015   PFIZER(Purple Top)SARS-COV-2 Vaccination 08/13/2019, 09/18/2019, 03/21/2020   Pneumococcal Conjugate-13 08/26/2014   Pneumococcal Polysaccharide-23 02/27/2008, 07/31/2020    Conditions to be addressed/monitored: CAD, HTN, HLD, DMII, CKD Stage 3b, and BPH, h/o prostate cancer; multiple myeloma; glaucoma  Care Plan : General Pharmacy (Adult)  Updates made by Cherre Robins, RPH-CPP since 07/13/2021  12:00 AM     Problem: HTN, HLD, DM   Priority: High  Onset Date: 08/15/2020     Long-Range Goal: Provide education, support and care coordination for medication therapy and chronic conditions   Start Date: 08/15/2020  Expected End Date: 02/15/2021  Recent Progress: On track  Priority: High  Note:   Current Barriers:  Not taking medications as prescribed. Patient education regarding BG goals and glucometer needed.   Pharmacist Clinical Goal(s):  Over the next 90 days, patient will achieve adherence to monitoring guidelines and medication adherence to achieve therapeutic efficacy maintain control of blood pressure and blood sugar as evidenced by home monitoring  adhere to prescribed medication regimen as evidenced by fill dates through collaboration with PharmD and provider.   Interventions: 1:1 collaboration with Carollee Herter, Alferd Apa, DO regarding development and update of comprehensive plan of care as evidenced by provider attestation and co-signature Inter-disciplinary care team collaboration (see longitudinal plan of care) Comprehensive medication review performed; medication list updated in electronic medical record  Hypertension (BP goal <130/80) Controlled Current  treatment: Amlodipine 2.16m daily Losartan-hydrochlorothiazide 100-25 mg daily  Metoprolol succinate ER 566mdaily (has not been taking recently) Medications previously tried: hydralazine (d/c post hospital) Current home readings:  blood pressure was 160/66 today Current exercise habits: Runs or stationary bike daily; lifts 20lb weight for arm exercise daily and does squats daily; also push mows own lawn and grows 50 organic tomato plants each summer.  Denies hypotensive/hypertensive symptoms; denies edema Interventions:  Educated on BP goals and benefits of medications for prevention of heart attack, stroke and kidney damage; Recommended continue to check blood pressure 2 to 3 times per week, document, and provide log at future appointments Restart metoprolol - called [employer] and tracked package - has been delivered 07/02/2021. Patient was able to find at his home. Will restart today. Discussed using [employer] over-the-counter benefits to purchase new blood pressure cuff.   Hyperlipidemia / CAD: (LDL goal < 70) Controlled Current treatment: Aspirin 81 mg daily Atorvastatin 20 mg daily Fish Oil 2g daily Medications previously tried: simvastatin (amlodipine interaction) Current exercise habits: see above Interventions:  Reviewed cholesterol goals;  Benefits of statin for ASCVD risk reduction;  Recommended to continue current medication  Diabetes (A1c goal <7%) Managed by endo - Dr ShKelton Pillarast A1c 7.1%  Current medications: Glimepiride 44m43maily  Janumet 50-1000m58mice daily Medications previously tried: glimepiride 4mg 844mly (hypoglycemia)  Current home glucose readings range from 90 to 135. Denies hypoglycemic/hyperglycemic symptoms; Denies blood glucose less than 80 Noted to have macrovascular and neuropathic complications  Current exercise: see above Current diet: breakfast is usually avocado, 1 egg and 1 slice of toast; Lunch - sandwich or vegetables; Dinner - eats out with  his son most nightsbut usually salad, vegetable and either chicken, lamb or fish.  Interventions:  Reviewed home blood glucose readings and reviewed goals  Fasting blood glucose goal (before meals) = 80 to 130 Blood glucose goal after a meal = less than 180  Reviewed how to use control solution to check to see that glucometer is accurate Continue to check blood glucose  once a day Future options could be to use SLGT2 since patient has CKD 3b Recommended to continue current medication  Great job with exercise - continue current activity level.   CKD - stage 3B: Goal: avoid nephrotoxic medications and slow CKD progression [place] Dr UptonHollie Salkt visit 05/2020) Lab Results  Component Value Date   CREATININE  1.35 (H) 04/27/2021   CREATININE 1.47 (H) 01/28/2021   CREATININE 1.47 12/04/2020  Interventions:  Reviewed med list for meds that might need adjustment based on Scr / eGFR No change in metformin dose currently but will continue to monitor.  Patient endorsed that he does not take any NSAIDs   Osteoporosis: Goal: Reduce risk of fracture due to osteopenia/osteoporosis Managed by rheumatologist left wrist fracture 2020.  Dexa done 2022: Lowest T-Score = -2.8 at left femoral neck.  Last PTH and vitamin D WNL. Current regimen:  Alendronate 65m - take 1 tablet once a week - take on an empty stomach with a full glass of water in the morning at least 30 minutes before eating or taking other medications or drinking anything other than water.  Interventions: Consider daily intake of 12077mof calcium daily through diet or supplementation  Intake of 120044mf calcium daily through diet and/or supplementation Continue alendronate  Medication management Pharmacist Clinical Goal(s): Over the next 90 days, patient will work with PharmD and providers to maintain optimal medication adherence I am concerned that patient has not filled medications on time. He reviewed each  of his medications over the phone and endorsed that he has all of them on hand.  [facility] Comprehensive medication review performed. Reviewed refill history and assessed adherence Consider getting medication container with alarm to remind you to take medications at appropriate time. Will continue to follow medication adherence   Patient Goals/Self-Care Activities Over the next 90 days, patient will:  take medications as prescribed  check glucose daily, document, and provide at future appointments check blood pressure 2 to 3 times per week, document, and provide at future appointments target a minimum of 150 minutes of moderate intensity exercise weekly Restart metoprolol for blood pressure  Consider getting medication container with alarm to remind you to take medications at appropriate time.  Follow Up Plan: The care management team will reach out to the patient again over the next 30 to 60 days.        Medication Assistance: None required.  Patient affirms current coverage meets needs.  [facility]  [id number]: [phone]: [phone] -[id number]: [phone]: [phone]it[place]: [phone]: [phone] Up:  Patient agrees to Care Plan and Follow-up.  Plan: Telephone follow up appointment with care management team member scheduled for:  CMA will check blood pressure in 4 weeks after restarting metoprolol  Clinical pharmacist will check in with patient in 3 months.   TammCherre RobinsarmD Clinical Pharmacist [phone]

## 2021-07-13 NOTE — Patient Instructions (Signed)
[name],   It was a pleasure speaking with you today.  I have attached a summary of our visit today and information about your health goals.    Patient Goals/Self-Care Activities Over the next 90 days, patient will:  take medications as prescribed  check glucose daily, document, and provide at future appointments check blood pressure 2 to 3 times per week, document, and provide at future appointments target a minimum of 150 minutes of moderate intensity exercise weekly Restart metoprolol for blood pressure  Consider getting medication container with alarm to remind you to take medications at appropriate time. I have sent you information about [employer]'s Over the Counter benefits. Please call me if you have questions.  If you have any questions or concerns, please feel free to contact me either at the phone number below or with a MyChart message.   Keep up the good work!  Cherre Robins, PharmD Clinical Pharmacist [facility] [place] [phone] (direct line)  [phone] (main office number)  Current Barriers:  Chronic Disease Management support, education, and care coordination needs related to Diabetes, HTN, Hyperlipidemia/CAD, BPH, CKD   Hypertension BP Readings from Last 3 Encounters:  05/04/21 (!) 162/61  04/27/21 (!) 162/85  02/17/21 124/76   Pharmacist Clinical Goal(s): Over the next 90 days, patient will work with PharmD and providers to achieve BP goal <130/80 Current regimen:  Amlodipine 2.5mg  daily  Losartan/hydrochlorothiazide  100-25 mg daily  Metoprolol ER 50mg  daily  Interventions: Requested patient to check his blood pressure 2 to 3 times per week and record Discussed blood pressure goal and importance of monitoring blood pressure  Sent in updated prescription for metoprolol ER Patient self care activities - Over the next 90 days, patient will: Check blood pressure 2 to 3 times per week, document, and provide at future  appointments Ensure daily salt intake < 2300 mg/day Continue current blood pressure medications Restart metoprolol ER 50 mg daily   Hyperlipidemia Lab Results  Component Value Date/Time   LDLCALC 56 12/04/2020 09:10 AM   Pharmacist Clinical Goal(s): Over the next 90 days, patient will work with PharmD and providers to maintain LDL goal < 70 Current regimen:  Atorvastatin 20mg  daily Interventions: Discussed importance of medication adherence  Patient self care activities - Over the next 90 days, patient will: Maintain cholesterol medication regimen.   Diabetes Lab Results  Component Value Date/Time   HGBA1C 7.1 (A) 02/17/2021 08:29 AM   HGBA1C 7.2 (H) 07/31/2020 09:58 AM   HGBA1C 6.6 (A) 04/15/2020 08:17 AM   HGBA1C 9.6 (H) 01/15/2020 09:58 AM   Pharmacist Clinical Goal(s): Over the next 90 days, patient will work with PharmD and providers to maintain A1c goal <7% Current regimen:  Glimepiride 2mg  daily Janumet 50-1000mg  twice daily Interventions: Requested patient to check blood sugar daily and record Reviewed home blood glucose readings and reviewed goals  Fasting blood glucose goal (before meals) = 80 to 130 Blood glucose goal after a meal = less than 180  Patient self care activities - Over the next 90 days, patient will: Check blood sugar once daily, document, and provide at future appointments Contact provider with any episodes of hypoglycemia (blood glucose less than 80) Great job with exercise - continue current activity level.  Continue current medications to lower blood glucose  Osteoporosis: Pharmacist Clinical Goal(s) Over the next 90 days, patient will work with PharmD and providers to reduce risk of fracture due to osteopenia/osteoporosis Current regimen:  Alendronate 70mg  - take 1  tablet once a week - take on an empty stomach with a full glass of water in the morning at least 30 minutes before eating or taking other medications or drinking anything other  than water.  Interventions: Consider daily intake of 1200mg  of calcium daily through diet or supplementation  Patient self care activities - Over the next 90 days, patient will: Intake of 1200mg  of calcium daily through diet and/or supplementation Continue alendronate  Medication management Pharmacist Clinical Goal(s): Over the next 90 days, patient will work with PharmD and providers to achieve optimal medication adherence [facility] Inter[facility] Comprehensive medication review performed. Continue current medication management strategy Patient self care activities - Over the next 90 days, patient will: Focus on medication adherence by filling and taking medications appropriately  Take medications as prescribed Report any questions or concerns to PharmD and/or provider(s)    Patient verbalizes understanding of instructions and care plan provided today and agrees to view in [place]. Active MyChart status confirmed with patient.

## 2021-07-27 ENCOUNTER — Emergency Department (HOSPITAL_BASED_OUTPATIENT_CLINIC_OR_DEPARTMENT_OTHER)
Admission: EM | Admit: 2021-07-27 | Discharge: 2021-07-27 | Disposition: A | Discharge status: Home / Self Care | Payer: Medicare HMO | Attending: Emergency Medicine | Admitting: Emergency Medicine

## 2021-07-27 ENCOUNTER — Encounter (HOSPITAL_BASED_OUTPATIENT_CLINIC_OR_DEPARTMENT_OTHER): Payer: Self-pay | Admitting: *Deleted

## 2021-07-27 ENCOUNTER — Emergency Department (HOSPITAL_BASED_OUTPATIENT_CLINIC_OR_DEPARTMENT_OTHER): Payer: Medicare HMO

## 2021-07-27 ENCOUNTER — Other Ambulatory Visit: Payer: Self-pay

## 2021-07-27 DIAGNOSIS — R519 Headache, unspecified: Secondary | ICD-10-CM | POA: Diagnosis not present

## 2021-07-27 DIAGNOSIS — E119 Type 2 diabetes mellitus without complications: Secondary | ICD-10-CM | POA: Diagnosis not present

## 2021-07-27 DIAGNOSIS — Z7984 Long term (current) use of oral hypoglycemic drugs: Secondary | ICD-10-CM | POA: Diagnosis not present

## 2021-07-27 DIAGNOSIS — Z79899 Other long term (current) drug therapy: Secondary | ICD-10-CM | POA: Diagnosis not present

## 2021-07-27 DIAGNOSIS — Z8579 Personal history of other malignant neoplasms of lymphoid, hematopoietic and related tissues: Secondary | ICD-10-CM | POA: Insufficient documentation

## 2021-07-27 DIAGNOSIS — Z7982 Long term (current) use of aspirin: Secondary | ICD-10-CM | POA: Insufficient documentation

## 2021-07-27 DIAGNOSIS — Z20822 Contact with and (suspected) exposure to covid-19: Secondary | ICD-10-CM | POA: Insufficient documentation

## 2021-07-27 DIAGNOSIS — R809 Proteinuria, unspecified: Secondary | ICD-10-CM | POA: Diagnosis not present

## 2021-07-27 DIAGNOSIS — I160 Hypertensive urgency: Secondary | ICD-10-CM | POA: Diagnosis not present

## 2021-07-27 DIAGNOSIS — I10 Essential (primary) hypertension: Secondary | ICD-10-CM | POA: Diagnosis present

## 2021-07-27 LAB — BASIC METABOLIC PANEL
Anion gap: 7 (ref 5–15)
BUN: 21 mg/dL (ref 8–23)
CO2: 28 mmol/L (ref 22–32)
Calcium: 9.2 mg/dL (ref 8.9–10.3)
Chloride: 100 mmol/L (ref 98–111)
Creatinine, Ser: 1.29 mg/dL — ABNORMAL HIGH (ref 0.61–1.24)
GFR, Estimated: 54 mL/min — ABNORMAL LOW (ref >60)
Glucose, Bld: 211 mg/dL — ABNORMAL HIGH (ref 70–99)
Potassium: 4.6 mmol/L (ref 3.5–5.1)
Sodium: 135 mmol/L (ref 135–145)

## 2021-07-27 LAB — CBC WITH DIFFERENTIAL/PLATELET
Abs Immature Granulocytes: 0.04 10*3/uL (ref 0.00–0.07)
Basophils Absolute: 0.1 10*3/uL (ref 0.0–0.1)
Basophils Relative: 1 %
Eosinophils Absolute: 0.2 10*3/uL (ref 0.0–0.5)
Eosinophils Relative: 1 %
HCT: 41.8 % (ref 39.0–52.0)
Hemoglobin: 14 g/dL (ref 13.0–17.0)
Immature Granulocytes: 0 %
Lymphocytes Relative: 32 %
Lymphs Abs: 3.4 10*3/uL (ref 0.7–4.0)
MCH: 30 pg (ref 26.0–34.0)
MCHC: 33.5 g/dL (ref 30.0–36.0)
MCV: 89.5 fL (ref 80.0–100.0)
Monocytes Absolute: 0.8 10*3/uL (ref 0.1–1.0)
Monocytes Relative: 8 %
Neutro Abs: 6.1 10*3/uL (ref 1.7–7.7)
Neutrophils Relative %: 58 %
Platelets: 227 10*3/uL (ref 150–400)
RBC: 4.67 MIL/uL (ref 4.22–5.81)
RDW: 13.2 % (ref 11.5–15.5)
WBC: 10.6 10*3/uL — ABNORMAL HIGH (ref 4.0–10.5)
nRBC: 0 % (ref 0.0–0.2)

## 2021-07-27 LAB — URINALYSIS, ROUTINE W REFLEX MICROSCOPIC
Bilirubin Urine: NEGATIVE
Glucose, UA: >=500 mg/dL — AB
Hgb urine dipstick: NEGATIVE
Ketones, ur: NEGATIVE mg/dL
Leukocytes,Ua: NEGATIVE
Nitrite: NEGATIVE
Protein, ur: >300 mg/dL — AB
Specific Gravity, Urine: 1.025 (ref 1.005–1.030)
pH: 7.5 (ref 5.0–8.0)

## 2021-07-27 LAB — URINALYSIS, MICROSCOPIC (REFLEX): WBC, UA: NONE SEEN WBC/hpf (ref 0–5)

## 2021-07-27 LAB — RESP PANEL BY RT-PCR (FLU A&B, COVID) ARPGX2
Influenza A by PCR: NEGATIVE
Influenza B by PCR: NEGATIVE
SARS Coronavirus 2 by RT PCR: NEGATIVE

## 2021-07-27 MED ORDER — LABETALOL HCL 5 MG/ML IV SOLN
20.0000 mg | Freq: Once | INTRAVENOUS | Status: AC
  Administered 2021-07-27: 20 mg via INTRAVENOUS
  Filled 2021-07-27: qty 4

## 2021-07-27 MED ORDER — HYDRALAZINE HCL 20 MG/ML IJ SOLN
10.0000 mg | Freq: Once | INTRAMUSCULAR | Status: AC
  Administered 2021-07-27: 10 mg via INTRAVENOUS
  Filled 2021-07-27: qty 1

## 2021-07-27 NOTE — Discharge Instructions (Signed)
Increase your Amlodipine (Norvasc) to 5mg  daily (two tabs) until you are able to see Dr. Etter Sjogren.

## 2021-07-27 NOTE — ED Provider Notes (Signed)
[facility]  Provider Note  CSN: [id number] Arrival date & time: 07/27/21 1019  History Chief Complaint  Patient presents with   Hypertension    [name] is a 88 y.o. male with history of HTN, DM and smoldering myeloma reports he has been in his usual state of health but his BP has been running high the last few days, higher than usual. He noticed his heart was skipping a beat from time to time but otherwise has been asymptomatic. He still runs 79mand 1073maces in competition. He has not had any CP, SOB, N/V/D or dysuria although he does not foamy urine. He has not had any change in diet or medications. He has been on Metoprolol, Losartan-HCTZ and Amlodipine for his BP at home.    Home Medications Prior to Admission medications   Medication Sig Start Date End Date Taking? Authorizing Provider  Alcohol Swabs PADS Use as directed once a day 11/10/20   LoCarollee HerterYvAlferd ApaDO  alendronate (FOSAMAX) 70 MG tablet TAKE 1 TABLET (70 MG TOTAL) ONCE A WEEK. TAKE WITH A FULL GLASS OF WATER ON AN EMPTY STOMACH. 06/18/21   Rice, ChResa MinerMD  amLODipine (NORVASC) 2.5 MG tablet Take 1 tablet (2.5 mg total) by mouth daily. 01/12/21   LoAnn HeldDO  aspirin 81 MG tablet Take 81 mg by mouth daily.    [provider]  atorvastatin (LIPITOR) 20 MG tablet Take 1 tablet (20 mg total) by mouth daily. 11/06/20   LoAnn HeldDO  Blood Glucose Calibration (TRUE METRIX LEVEL 1) Low SOLN USE AS DIRECTED 03/19/20   [name], IbMelanie CrazierMD  Blood Glucose Calibration (TRUE METRIX LEVEL 3) High SOLN Use to check controls on glucometer strips every 30 days or with each new bottle of strips (whichever comes first). 01/24/20   LoRoma Schanz, DO  Blood Glucose Monitoring Suppl (TRUE METRIX METER) w/Device KIT USE AS DIRECTED ONCE A DAY 08/14/20   [name], IbMelanie CrazierMD  brimonidine-timolol (COMBIGAN) 0.2-0.5 % ophthalmic solution  Place 1 drop into both eyes 2 (two) times daily.    [provider]  COLLAGEN PO Take by mouth. 1 scoop daily    [provider]  fish oil-omega-3 fatty acids 1000 MG capsule Take 2 g by mouth daily.    [provider]  glimepiride (AMARYL) 1 MG tablet Take 1 tablet (1 mg total) by mouth daily with breakfast. 02/17/21   [name], IbMelanie CrazierMD  glucose blood (TRUE METRIX BLOOD GLUCOSE TEST) test strip USE AS DIRECTED ONCE A DAY 11/10/20   LoCarollee HerterYvAlferd ApaDO  losartan-hydrochlorothiazide (HYZAAR) 100-25 MG tablet Take 1 tablet by mouth daily. 11/10/20   LoAnn HeldDO  metoprolol succinate (TOPROL-XL) 50 MG 24 hr tablet Take 1 tablet (50 mg total) by mouth daily. TAKE WITH OR IMMEDIATELY FOLLOWING A MEAL. Patient not taking: Reported on 06/25/2021 06/23/21   LoAnn HeldDO  Multiple Vitamin (MULTIVITAMIN) tablet Take 1 tablet by mouth daily.    [provider]  SitaGLIPtin-MetFORMIN HCl (JANUMET XR) 50-1000 MG TB24 Take 2 tablets by mouth daily. 02/17/21   [name], IbMelanie CrazierMD  tamsulosin (FLOMAX) 0.4 MG CAPS capsule TAKE 1 CAPSULE (0.4 MG TOTAL) BY MOUTH DAILY. Patient not taking: Reported on 07/13/2021 01/24/20   LoRoma Schanz, DO  TRUEplus Lancets 33G MISC Use to check blood sugar once a day.  DX  E11.9 11/10/20   [name], DO     Allergies    Patient has no known allergies.   Review of Systems   Review of Systems Please see HPI for pertinent positives and negatives  Physical Exam BP (!) 156/59    Pulse 70    Temp 97.9 F (36.6 C) (Oral)    Resp 14    Ht 5' 4"  (1.626 m)    Wt 58.1 kg    SpO2 96%    BMI 21.99 kg/m   Physical Exam Vitals and nursing note reviewed.  Constitutional:      Appearance: Normal appearance.  HENT:     Head: Normocephalic and atraumatic.     Nose: Nose normal.     Mouth/Throat:     Mouth: Mucous membranes are moist.  Eyes:     Extraocular Movements: Extraocular  movements intact.     Conjunctiva/sclera: Conjunctivae normal.  Cardiovascular:     Rate and Rhythm: Normal rate.  Pulmonary:     Effort: Pulmonary effort is normal.     Breath sounds: Normal breath sounds.  Abdominal:     General: Abdomen is flat.     Palpations: Abdomen is soft.     Tenderness: There is no abdominal tenderness.  Musculoskeletal:        General: No swelling. Normal range of motion.     Cervical back: Neck supple.     Right lower leg: No edema.     Left lower leg: No edema.  Skin:    General: Skin is warm and dry.  Neurological:     General: No focal deficit present.     Mental Status: He is alert.  Psychiatric:        Mood and Affect: Mood normal.    ED Results / Procedures / Treatments   EKG EKG Interpretation  Date/Time:  Monday July 27 2021 11:27:03 EST Ventricular Rate:  67 PR Interval:  168 QRS Duration: 82 QT Interval:  362 QTC Calculation: 382 R Axis:   42 Text Interpretation: Sinus rhythm with occasional Premature ventricular complexes and Premature atrial complexes Otherwise normal ECG When compared with ECG of 24-Jul-2020 10:46, No significant change since last tracing Confirmed by Calvert Cantor [phone]) on 07/27/2021 3:16:53 PM  Procedures Procedures  Medications Ordered in the ED Medications  hydrALAZINE (APRESOLINE) injection 10 mg (10 mg Intravenous Given 07/27/21 1526)  labetalol (NORMODYNE) injection 20 mg (20 mg Intravenous Given 07/27/21 1756)    Initial Impression and Plan  Patient with marked increase in BP from baseline, already on 4 different agents, reports compliance (other than missing his Metoprolol for a few days in January due to delivery delay). He denies any systemic symptoms. Given history of smoldering myeloma/MGUS, concern for progressive disease with kidney involvement. Will check labs. Sinus rhythm on monitor now.   ED Course   Clinical Course as of 07/27/21 1915  Mon Jul 27, 2021  1519 Patient has  previously been on Hydralazine from prior hospitalization. Will give a dose now given his borderline HR and worsening BP.  [CS]  1527 CBC is unremarkable.  [CS]  1540 BMP with Cr at baseline.  [CS]  1423 BP improved transiently after Hydralazine but has increased again. Awaiting UA. [CS]  9532 UA with protein as per baseline but no signs of infection. BP is back elevated again but HR is adequate to try Labetalol. Will discuss admission with Hospitalist. He is also now having a headache, will send  for CT.  [CS]  9244 I personally viewed the images from radiology studies and agree with radiologist interpretation: No acute findings.   [CS]  1822 Spoke with Dr. Posey Pronto, Hospitalist, who does not think the patient needs admission and instead recommends increasing his amlodipine and close outpatient follow up. BP is improved again after labetalol, will continue to monitor.  [CS]  1912 BP remains under control and patient remains asymptomatic. Will increase amlodipine. Recommend he have close follow up with PCP later this week for a recheck. RTED for any other concerns.  [CS]    Clinical Course User Index [CS] Truddie Hidden, MD     MDM Rules/Calculators/A&P Medical Decision Making Problems Addressed: Hypertensive urgency: acute illness or injury that poses a threat to life or bodily functions Proteinuria, unspecified type: chronic illness or injury  Amount and/or Complexity of Data Reviewed Labs: ordered. Decision-making details documented in ED Course. Radiology: ordered and independent interpretation performed. Decision-making details documented in ED Course. ECG/medicine tests: ordered and independent interpretation performed. Decision-making details documented in ED Course.  Risk Prescription drug management. Decision regarding hospitalization.    Final Clinical Impression(s) / ED Diagnoses Final diagnoses:  Hypertensive urgency  Proteinuria, unspecified type    Rx / DC  Orders ED Discharge Orders     None        Truddie Hidden, MD 07/27/21 [phone]

## 2021-07-27 NOTE — ED Notes (Signed)
Patient transported to CT 

## 2021-07-27 NOTE — ED Triage Notes (Signed)
States his BP has been up and down for a [redacted] weeks along with an irregular heart beat. He takes his BP every day. States he started running a mile on a treadmill 2 months ago.

## 2021-07-27 NOTE — ED Notes (Signed)
Written and verbal inst to pt  Pt verbalized an understanding  To home with family

## 2021-07-28 ENCOUNTER — Telehealth: Payer: Self-pay | Admitting: Family Medicine

## 2021-07-28 NOTE — Telephone Encounter (Signed)
Pt was seen at [facility] ED for blood pressure and was advised to scheduled follow up within 3 days. Pt has upcoming appt 08/03/21, pt would like to know if it's okay to come then.   Please advise.

## 2021-07-28 NOTE — Telephone Encounter (Signed)
Noted and FYI 

## 2021-07-30 IMAGING — CT CT ABD-PELV W/ CM
3 of 13 series · 12 of 46 positions shown, 18 images · IV contrast (omnipaque)
Comparison: None.

CLINICAL DATA: Fall, landed on left side, pain

EXAM:
CT ABDOMEN AND PELVIS WITH CONTRAST
TECHNIQUE: Multidetector CT imaging of the abdomen and pelvis was performed
using the standard protocol following bolus administration of
intravenous contrast.
CONTRAST:  80mL OMNIPAQUE IOHEXOL 350 MG/ML SOLN

[Series 6: pe coronal mpr · coronal · 0.51mm/px · 1 of 119 slices shown, 2 images]
[im 60/119  soft-tissue]
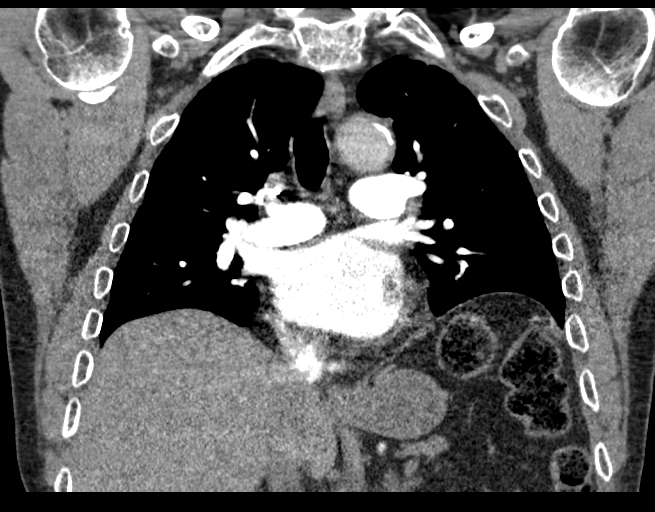
[im 60/119  bone]
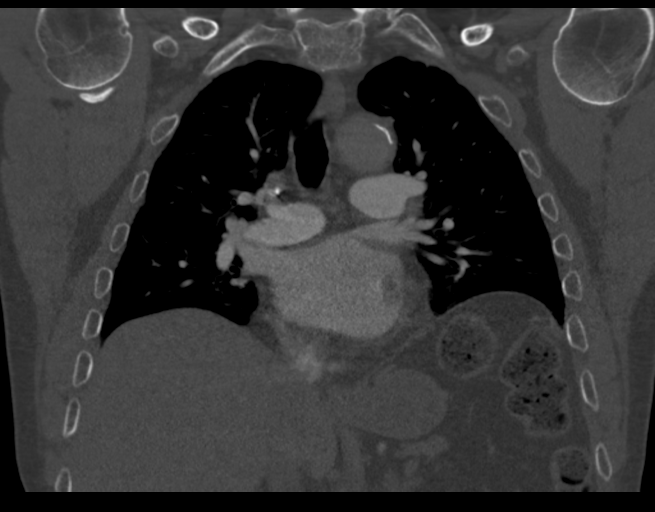

[Series 10: pe thins · axial · 0.70mm/px · z∈[-236,-96]mm · 6 of 247 slices shown]
[im 18/247  soft-tissue]
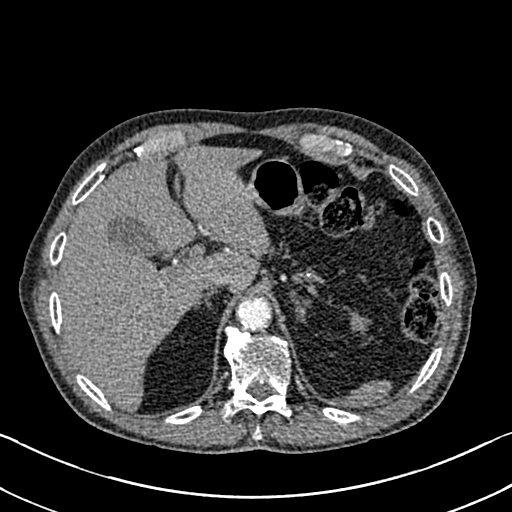
[im 53/247  soft-tissue]
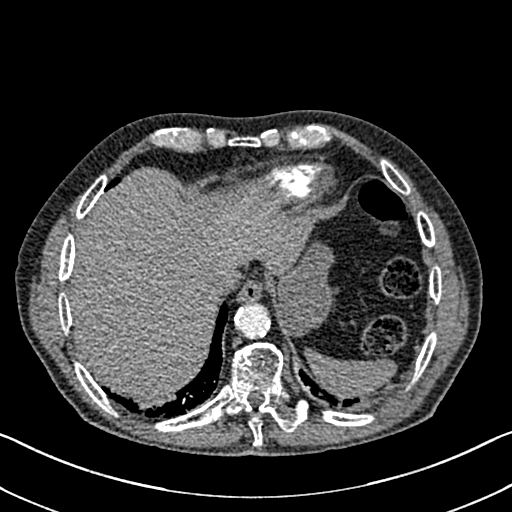
[im 88/247  soft-tissue]
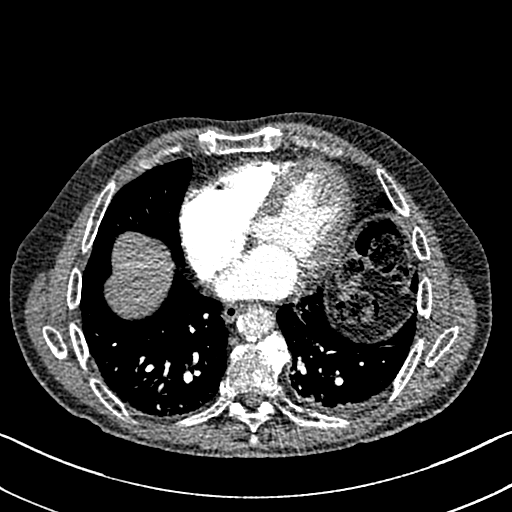
[im 106/247  soft-tissue]
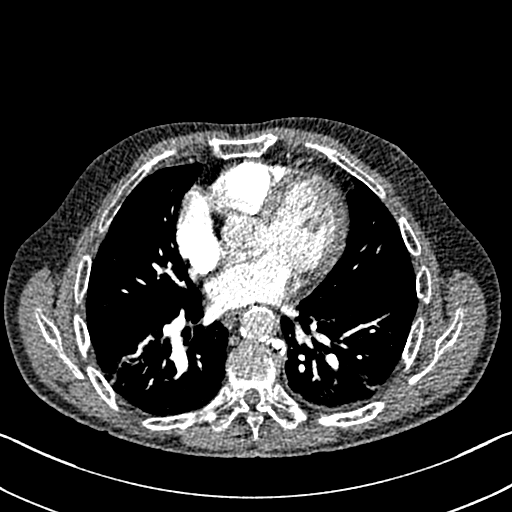
[im 141/247  soft-tissue]
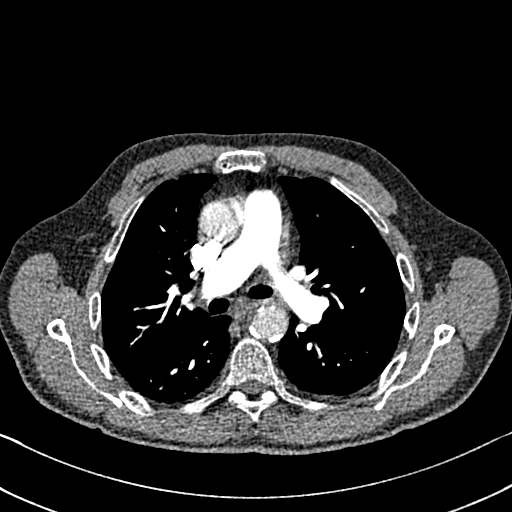
[im 159/247  soft-tissue]
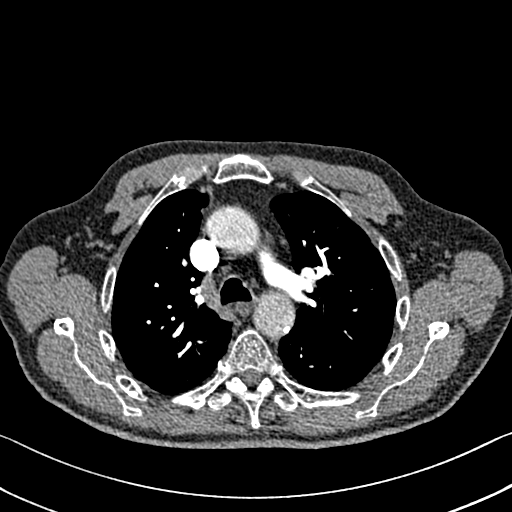

[Series 11: axial st · axial · 0.80mm/px · z∈[-584,-244]mm · 5 of 102 slices shown, 10 images]
[im 17/102  soft-tissue]
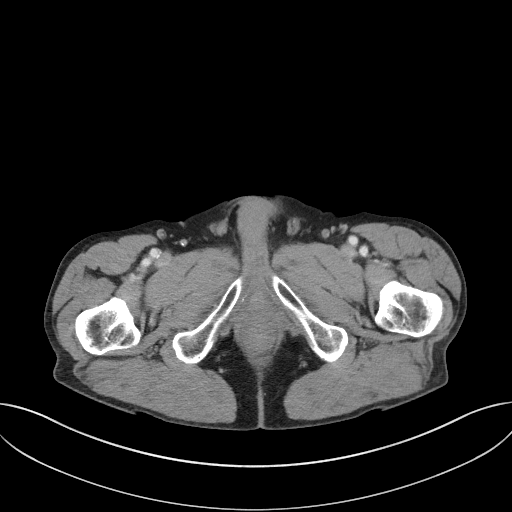
[im 17/102  bone]
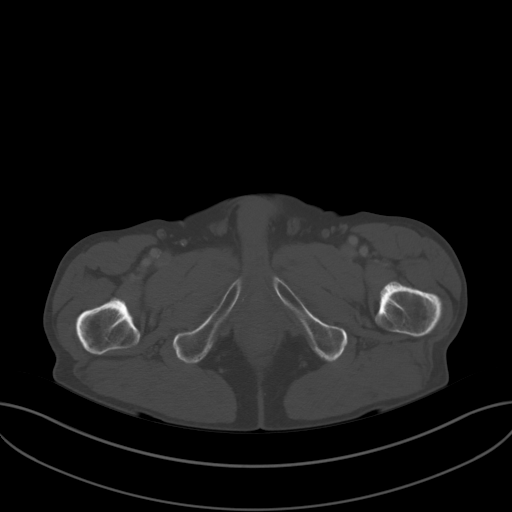
[im 34/102  soft-tissue]
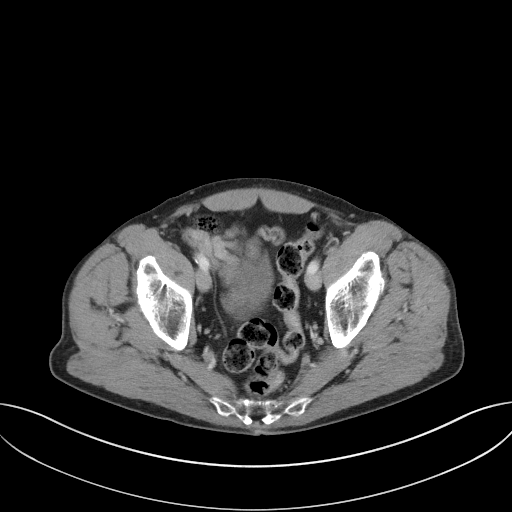
[im 34/102  lung]
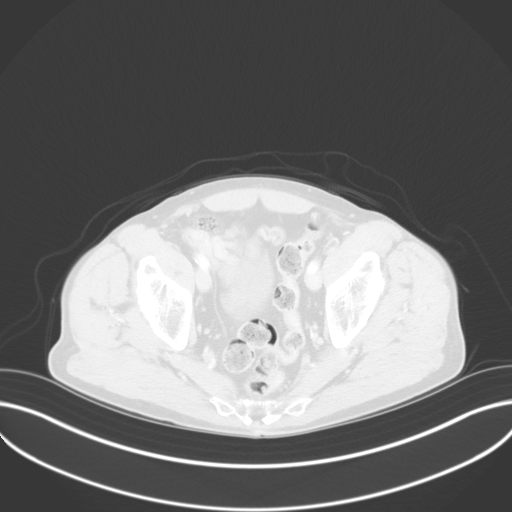
[im 51/102  soft-tissue]
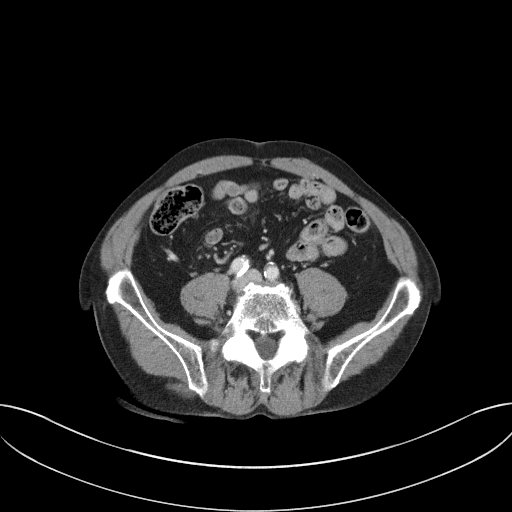
[im 51/102  lung]
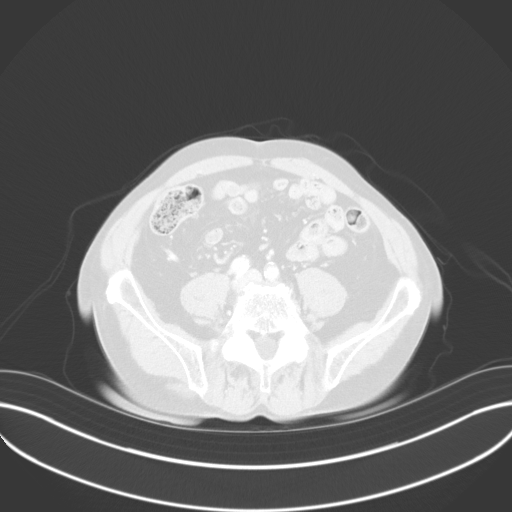
[im 68/102  soft-tissue]
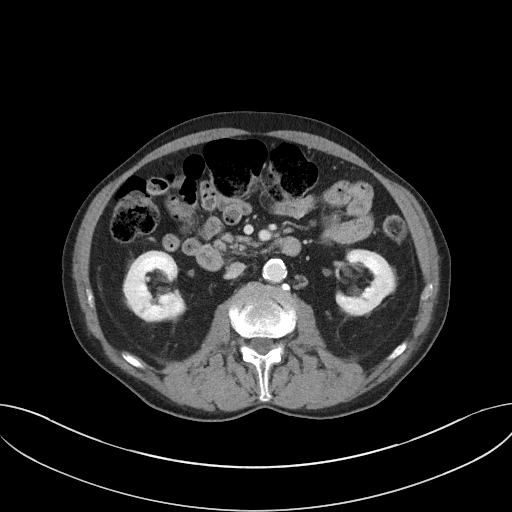
[im 68/102  lung]
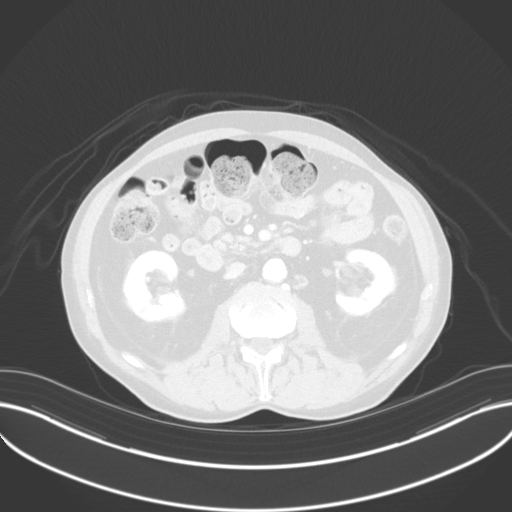
[im 85/102  soft-tissue]
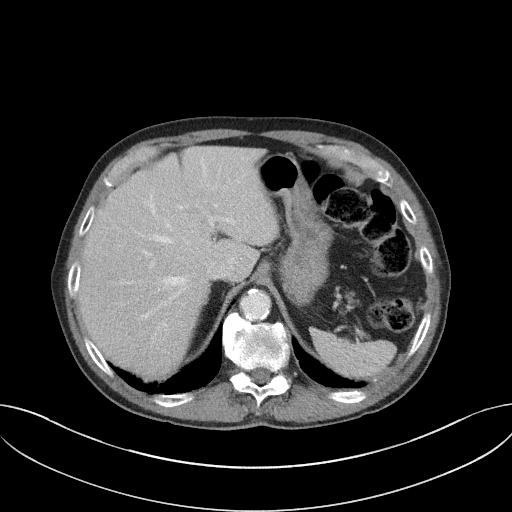
[im 85/102  lung]
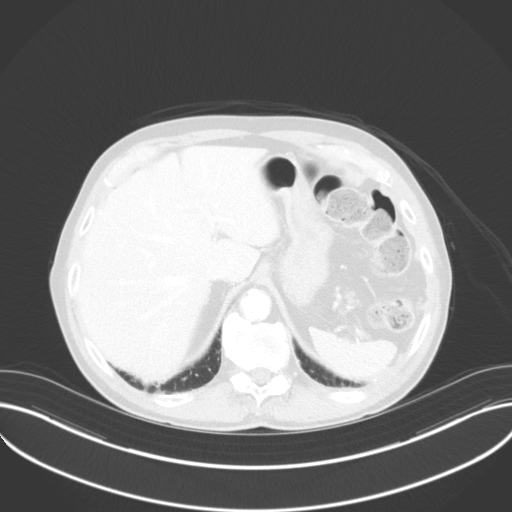

[12 of 46 positions shown; findings below may reference images not displayed]

FINDINGS: Lower chest: Please see separately dictated CT examination of the
chest.

Hepatobiliary: No solid liver abnormality is seen. No gallstones,
gallbladder wall thickening, or biliary dilatation.

Pancreas: Parenchymal atrophy. No pancreatic ductal dilatation or
surrounding inflammatory changes.

Spleen: Normal in size without significant abnormality.

Adrenals/Urinary Tract: Adrenal glands are unremarkable. Kidneys are
normal, without renal calculi, solid lesion, or hydronephrosis.
Bladder is unremarkable.

Stomach/Bowel: Stomach is within normal limits. Appendix appears
normal. No evidence of bowel wall thickening, distention, or
inflammatory changes. Large burden of stool and stool balls in the
colon.

Vascular/Lymphatic: Aortic atherosclerosis. No enlarged abdominal or
pelvic lymph nodes.

Reproductive: Prostatomegaly. Brachytherapy pellets in the prostate.

Other: No abdominal wall hernia or abnormality. No abdominopelvic
ascites.

Musculoskeletal: No acute or significant osseous findings.
IMPRESSION: 1.  No evidence of acute traumatic injury in the abdomen or pelvis.

2.  Prostatomegaly status post brachytherapy.

3.  Aortic Atherosclerosis (E1ORL-VG6.6).

## 2021-08-03 ENCOUNTER — Ambulatory Visit (INDEPENDENT_AMBULATORY_CARE_PROVIDER_SITE_OTHER): Payer: Medicare HMO | Admitting: Family Medicine

## 2021-08-03 ENCOUNTER — Encounter: Payer: Self-pay | Admitting: Family Medicine

## 2021-08-03 VITALS — BP 120/80 | HR 57 | Temp 97.8°F | Resp 16 | Ht 64.0 in | Wt 128.0 lb

## 2021-08-03 DIAGNOSIS — I6522 Occlusion and stenosis of left carotid artery: Secondary | ICD-10-CM | POA: Diagnosis not present

## 2021-08-03 DIAGNOSIS — E1169 Type 2 diabetes mellitus with other specified complication: Secondary | ICD-10-CM

## 2021-08-03 DIAGNOSIS — Z23 Encounter for immunization: Secondary | ICD-10-CM | POA: Diagnosis not present

## 2021-08-03 DIAGNOSIS — N400 Enlarged prostate without lower urinary tract symptoms: Secondary | ICD-10-CM

## 2021-08-03 DIAGNOSIS — E785 Hyperlipidemia, unspecified: Secondary | ICD-10-CM | POA: Diagnosis not present

## 2021-08-03 DIAGNOSIS — Z0000 Encounter for general adult medical examination without abnormal findings: Secondary | ICD-10-CM | POA: Diagnosis not present

## 2021-08-03 DIAGNOSIS — I10 Essential (primary) hypertension: Secondary | ICD-10-CM | POA: Diagnosis not present

## 2021-08-03 DIAGNOSIS — E1165 Type 2 diabetes mellitus with hyperglycemia: Secondary | ICD-10-CM

## 2021-08-03 LAB — CBC WITH DIFFERENTIAL/PLATELET
Basophils Absolute: 0.1 10*3/uL (ref 0.0–0.1)
Basophils Relative: 0.9 % (ref 0.0–3.0)
Eosinophils Absolute: 0.2 10*3/uL (ref 0.0–0.7)
Eosinophils Relative: 2.5 % (ref 0.0–5.0)
HCT: 38.9 % — ABNORMAL LOW (ref 39.0–52.0)
Hemoglobin: 12.7 g/dL — ABNORMAL LOW (ref 13.0–17.0)
Lymphocytes Relative: 30 % (ref 12.0–46.0)
Lymphs Abs: 2.4 10*3/uL (ref 0.7–4.0)
MCHC: 32.6 g/dL (ref 30.0–36.0)
MCV: 89.8 fl (ref 78.0–100.0)
Monocytes Absolute: 1 10*3/uL (ref 0.1–1.0)
Monocytes Relative: 12.8 % — ABNORMAL HIGH (ref 3.0–12.0)
Neutro Abs: 4.3 10*3/uL (ref 1.4–7.7)
Neutrophils Relative %: 53.8 % (ref 43.0–77.0)
Platelets: 213 10*3/uL (ref 150.0–400.0)
RBC: 4.33 Mil/uL (ref 4.22–5.81)
RDW: 14 % (ref 11.5–15.5)
WBC: 7.9 10*3/uL (ref 4.0–10.5)

## 2021-08-03 LAB — LIPID PANEL
Cholesterol: 147 mg/dL (ref 0–200)
HDL: 42.8 mg/dL (ref >39.00)
LDL Cholesterol: 89 mg/dL (ref 0–99)
NonHDL: 103.71
Total CHOL/HDL Ratio: 3
Triglycerides: 76 mg/dL (ref 0.0–149.0)
VLDL: 15.2 mg/dL (ref 0.0–40.0)

## 2021-08-03 LAB — HEMOGLOBIN A1C: Hgb A1c MFr Bld: 9.9 % — ABNORMAL HIGH (ref 4.6–6.5)

## 2021-08-03 LAB — MICROALBUMIN / CREATININE URINE RATIO
Creatinine,U: 102.9 mg/dL
Microalb Creat Ratio: 33.3 mg/g — ABNORMAL HIGH (ref 0.0–30.0)
Microalb, Ur: 34.3 mg/dL — ABNORMAL HIGH (ref 0.0–1.9)

## 2021-08-03 LAB — COMPREHENSIVE METABOLIC PANEL
ALT: 13 U/L (ref 0–53)
AST: 17 U/L (ref 0–37)
Albumin: 4.1 g/dL (ref 3.5–5.2)
Alkaline Phosphatase: 68 U/L (ref 39–117)
BUN: 30 mg/dL — ABNORMAL HIGH (ref 6–23)
CO2: 31 mEq/L (ref 19–32)
Calcium: 9.2 mg/dL (ref 8.4–10.5)
Chloride: 105 mEq/L (ref 96–112)
Creatinine, Ser: 1.51 mg/dL — ABNORMAL HIGH (ref 0.40–1.50)
GFR: 41.27 mL/min — ABNORMAL LOW (ref >60.00)
Glucose, Bld: 100 mg/dL — ABNORMAL HIGH (ref 70–99)
Potassium: 4.1 mEq/L (ref 3.5–5.1)
Sodium: 141 mEq/L (ref 135–145)
Total Bilirubin: 0.7 mg/dL (ref 0.2–1.2)
Total Protein: 6.5 g/dL (ref 6.0–8.3)

## 2021-08-03 NOTE — Progress Notes (Signed)
Subjective:   By signing my name below, I, Shehryar Baig, attest that this documentation has been prepared under the direction and in the presence of [name], DO. 08/03/2021    Patient ID: [name], male    DOB: 04-Jan-1932, 88 y.o.   MRN: [id number]  Chief Complaint  Patient presents with   Annual Exam    Pt states fasting     HPI Patient is in today for comprehensive physical exam.  He reports a couple of new moles on his right shoulder. They do not bother him at this time.  He reports visiting an emergency room last week due to high blood pressure. He was given hydralazine and labetalol to manage his blood and pressure and found it reduced while taking it. His blood pressure is doing well during this visit. His systolic blood pressure measured from 155-194 this past week according to his blood pressure log this past week.  BP Readings from Last 3 Encounters:  08/03/21 120/80  07/27/21 (!) 171/65  05/04/21 (!) 162/61   Pulse Readings from Last 3 Encounters:  08/03/21 (!) 57  07/27/21 72  05/04/21 65   He has not seen his urologist recently. He does not regularly see his nephrologist regularly. He continues seeing his endocrinologist regularly and reports completing his feet exams with them.  He denies having any fever, ear pain, new muscle pain, joint pain, congestion, sinus pain, sore throat, eye pain, chest pain, palpations, cough, SOB, wheezing, n/v/d, constipation, blood in stool, dysuria, frequency, hematuria, or headaches at this time. He does not have a flu vaccine this year and in interested in receiving it during this visit. He has 3 pfizer Covid-19 vaccines at this time. He is due for the shingles and tetanus vaccine and is interested in receiving them at his pharmacy.  He continues participating in regular exercise daily in anticipation for a running competition this year. He is UTD on vision care. He reports having 20/20 vision during his last visit.     Past Medical History:  Diagnosis Date   CAD (coronary artery disease)    LHC 03/25/11 by Dr. Burt Knack:  pLAD 99%, oCFX 20-30%, pOM1 40%, dAVCFX 20-30%.  EF was normal on nuclear study.  He was treated with a Promus DES to his pLAD.    Colon polyps 1996   villous adenoma   DM type 2 (diabetes mellitus, type 2) ([place]) 2002   Fuchs' corneal dystrophy    Hyperlipidemia    Hypertension    Macular degeneration    posterior vitreous vitreous detachment   Prostate cancer ([place]) dx 2018    Past Surgical History:  Procedure Laterality Date   CARDIAC CATHETERIZATION  03/25/2011   cataract Bilateral 2010   corenea implants  june and sept 2018   [name]   CORONARY STENT PLACEMENT  03/25/2011   RADIOACTIVE SEED IMPLANT N/A 05/06/2017   Procedure: RADIOACTIVE SEED IMPLANT/BRACHYTHERAPY IMPLANT;  Surgeon: Kathie Rhodes, MD;  Location: [facility];  Service: Urology;  Laterality: N/A;   SPACE OAR INSTILLATION N/A 05/06/2017   Procedure: SPACE OAR INSTILLATION;  Surgeon: Kathie Rhodes, MD;  Location: [facility];  Service: Urology;  Laterality: N/A;    Family History  Problem Relation Age of Onset   Stomach cancer Mother 73   Coronary artery disease Father 60       deceased   Healthy Son    Healthy Son    Healthy Son  Social History   Socioeconomic History   Marital status: Widowed    Spouse name: Not on file   Number of children: Not on file   Years of education: Not on file   Highest education level: Not on file  Occupational History   Occupation: retired  Tobacco Use   Smoking status: Former    Packs/day: 0.25    Years: 3.00    Pack years: 0.75    Types: Cigarettes    Quit date: 06/07/1958    Years since quitting: 63.2   Smokeless tobacco: Never  Vaping Use   Vaping Use: Never used  Substance and Sexual Activity   Alcohol use: Not Currently   Drug use: No   Sexual activity: Not Currently  Other Topics Concern   Not on file   Social History Narrative   Not on file   Social Determinants of Health   Financial Resource Strain: Low Risk    Difficulty of Paying Living Expenses: Not hard at all  Food Insecurity: No Food Insecurity   Worried About Charity fundraiser in the Last Year: Never true   [place] in the Last Year: Never true  Transportation Needs: No Transportation Needs   Lack of Transportation (Medical): No   Lack of Transportation (Non-Medical): No  Physical Activity: Sufficiently Active   Days of Exercise per Week: 7 days   Minutes of Exercise per Session: 30 min  Stress: No Stress Concern Present   Feeling of Stress : Not at all  Social Connections: Moderately Integrated   Frequency of Communication with Friends and Family: More than three times a week   Frequency of Social Gatherings with Friends and Family: Twice a week   Attends Religious Services: More than 4 times per year   Active Member of [employer] or Organizations: Yes   Attends Archivist Meetings: More than 4 times per year   Marital Status: Widowed  Human resources officer Violence: Not At Risk   Fear of Current or Ex-Partner: No   Emotionally Abused: No   Physically Abused: No   Sexually Abused: No    Outpatient Medications Prior to Visit  Medication Sig Dispense Refill   Alcohol Swabs PADS Use as directed once a day 100 each 1   alendronate (FOSAMAX) 70 MG tablet TAKE 1 TABLET (70 MG TOTAL) ONCE A WEEK. TAKE WITH A FULL GLASS OF WATER ON AN EMPTY STOMACH. 12 tablet 2   amLODipine (NORVASC) 2.5 MG tablet Take 1 tablet (2.5 mg total) by mouth daily. 90 tablet 1   aspirin 81 MG tablet Take 81 mg by mouth daily.     atorvastatin (LIPITOR) 20 MG tablet Take 1 tablet (20 mg total) by mouth daily. 90 tablet 1   Blood Glucose Calibration (TRUE METRIX LEVEL 1) Low SOLN USE AS DIRECTED 1 each 3   Blood Glucose Calibration (TRUE METRIX LEVEL 3) High SOLN Use to check controls on glucometer strips every 30 days or with each new  bottle of strips (whichever comes first). 3 each 1   Blood Glucose Monitoring Suppl (TRUE METRIX METER) w/Device KIT USE AS DIRECTED ONCE A DAY 1 kit 0   brimonidine-timolol (COMBIGAN) 0.2-0.5 % ophthalmic solution Place 1 drop into both eyes 2 (two) times daily.     COLLAGEN PO Take by mouth. 1 scoop daily     fish oil-omega-3 fatty acids 1000 MG capsule Take 2 g by mouth daily.     glimepiride (AMARYL) 1 MG tablet  Take 1 tablet (1 mg total) by mouth daily with breakfast. 90 tablet 2   glucose blood (TRUE METRIX BLOOD GLUCOSE TEST) test strip USE AS DIRECTED ONCE A DAY 100 strip 3   losartan-hydrochlorothiazide (HYZAAR) 100-25 MG tablet Take 1 tablet by mouth daily. 90 tablet 1   Multiple Vitamin (MULTIVITAMIN) tablet Take 1 tablet by mouth daily.     SitaGLIPtin-MetFORMIN HCl (JANUMET XR) 50-1000 MG TB24 Take 2 tablets by mouth daily. 180 tablet 2   TRUEplus Lancets 33G MISC Use to check blood sugar once a day.  DX  E11.9 100 each 1   metoprolol succinate (TOPROL-XL) 50 MG 24 hr tablet Take 1 tablet (50 mg total) by mouth daily. TAKE WITH OR IMMEDIATELY FOLLOWING A MEAL. (Patient not taking: Reported on 06/25/2021) 90 tablet 1   tamsulosin (FLOMAX) 0.4 MG CAPS capsule TAKE 1 CAPSULE (0.4 MG TOTAL) BY MOUTH DAILY. (Patient not taking: Reported on 07/13/2021) 90 capsule 1   No facility-administered medications prior to visit.    No Known Allergies  Review of Systems  Constitutional:  Negative for fever and malaise/fatigue.  HENT:  Negative for congestion and sore throat.   Eyes:  Negative for blurred vision.  Respiratory:  Negative for cough, shortness of breath and wheezing.   Cardiovascular:  Negative for chest pain, palpitations and leg swelling.  Gastrointestinal:  Negative for abdominal pain, blood in stool, constipation, diarrhea, nausea and vomiting.  Genitourinary:  Negative for dysuria, frequency and hematuria.  Musculoskeletal:  Negative for falls, joint pain and myalgias.  Skin:   Negative for rash.       (+)moles on right shoulder  Neurological:  Negative for dizziness, loss of consciousness and headaches.  Endo/Heme/Allergies:  Negative for environmental allergies.  Psychiatric/Behavioral:  Negative for depression. The patient is not nervous/anxious.       Objective:    Physical Exam Vitals and nursing note reviewed.  Constitutional:      General: He is not in acute distress.    Appearance: Normal appearance. He is not ill-appearing.  HENT:     Head: Normocephalic and atraumatic.     Right Ear: Tympanic membrane, ear canal and external ear normal.     Left Ear: Tympanic membrane, ear canal and external ear normal.  Eyes:     Extraocular Movements: Extraocular movements intact.     Pupils: Pupils are equal, round, and reactive to light.  Neck:     Vascular: Carotid bruit (left side) present.  Cardiovascular:     Rate and Rhythm: Normal rate and regular rhythm.     Heart sounds: Normal heart sounds. No murmur heard.   No gallop.  Pulmonary:     Effort: Pulmonary effort is normal. No respiratory distress.     Breath sounds: Normal breath sounds. No wheezing or rales.  Abdominal:     General: Bowel sounds are normal. There is no distension.     Palpations: Abdomen is soft.     Tenderness: There is no abdominal tenderness. There is no guarding.  Feet:     Comments: Diabetic Foot Exam - Simple   Simple Foot Form Diabetic Foot exam was performed with the following findings: Yes  08/03/2021  8:43 AM  Visual Inspection No deformities, no ulcerations, no other skin breakdown bilaterally: Yes Sensation Testing Intact to touch and monofilament testing bilaterally: Yes Pulse Check Posterior Tibialis and Dorsalis pulse intact bilaterally: Yes Comments    Skin:    General: Skin is warm and dry.  Neurological:  General: No focal deficit present.     Mental Status: He is alert and oriented to person, place, and time.  Psychiatric:        Attention and  Perception: Attention normal.        Mood and Affect: Mood and affect normal.        Speech: Speech normal.        Behavior: Behavior normal.        Thought Content: Thought content normal.        Cognition and Memory: Cognition and memory normal.        Judgment: Judgment normal.    BP 120/80 (BP Location: Left Arm, Patient Position: Sitting, Cuff Size: Normal)    Pulse (!) 57    Temp 97.8 F (36.6 C) (Oral)    Resp 16    Ht 5' 4"  (1.626 m)    Wt 128 lb (58.1 kg)    SpO2 96%    BMI 21.97 kg/m  Wt Readings from Last 3 Encounters:  08/03/21 128 lb (58.1 kg)  07/27/21 128 lb 1.4 oz (58.1 kg)  05/04/21 128 lb (58.1 kg)    Diabetic Foot Exam - Simple   No data filed    Lab Results  Component Value Date   WBC 10.6 (H) 07/27/2021   HGB 14.0 07/27/2021   HCT 41.8 07/27/2021   PLT 227 07/27/2021   GLUCOSE 211 (H) 07/27/2021   CHOL 109 12/04/2020   TRIG 84.0 12/04/2020   HDL 36.80 (L) 12/04/2020   LDLCALC 56 12/04/2020   ALT 20 04/27/2021   AST 17 04/27/2021   NA 135 07/27/2021   K 4.6 07/27/2021   CL 100 07/27/2021   CREATININE 1.29 (H) 07/27/2021   BUN 21 07/27/2021   CO2 28 07/27/2021   TSH 2.47 10/20/2009   PSA 9.32 (H) 08/30/2016   INR 1.03 05/02/2017   HGBA1C 7.1 (A) 02/17/2021   MICROALBUR 63.4 (H) 07/31/2020    Lab Results  Component Value Date   TSH 2.47 10/20/2009   Lab Results  Component Value Date   WBC 10.6 (H) 07/27/2021   HGB 14.0 07/27/2021   HCT 41.8 07/27/2021   MCV 89.5 07/27/2021   PLT 227 07/27/2021   Lab Results  Component Value Date   NA 135 07/27/2021   K 4.6 07/27/2021   CO2 28 07/27/2021   GLUCOSE 211 (H) 07/27/2021   BUN 21 07/27/2021   CREATININE 1.29 (H) 07/27/2021   BILITOT 0.4 04/27/2021   ALKPHOS 88 04/27/2021   AST 17 04/27/2021   ALT 20 04/27/2021   PROT 6.7 04/27/2021   ALBUMIN 3.6 04/27/2021   CALCIUM 9.2 07/27/2021   ANIONGAP 7 07/27/2021   GFR 42.82 (L) 12/04/2020   Lab Results  Component Value Date   CHOL  109 12/04/2020   Lab Results  Component Value Date   HDL 36.80 (L) 12/04/2020   Lab Results  Component Value Date   LDLCALC 56 12/04/2020   Lab Results  Component Value Date   TRIG 84.0 12/04/2020   Lab Results  Component Value Date   CHOLHDL 3 12/04/2020   Lab Results  Component Value Date   HGBA1C 7.1 (A) 02/17/2021   Colonoscopy-  Last completed 05/26/2010.  Dexa- Last completed 12/18/2020. Results showed he is osteoporotic. Repeat in 2 years.      Assessment & Plan:   Problem List Items Addressed This Visit   None    No orders of the defined types were placed  in this encounter.   I, Shehryar [place], personally preformed the services described in this documentation.  All medical record entries made by the scribe were at my direction and in my presence.  I have reviewed the chart and discharge instructions (if applicable) and agree that the record reflects my personal performance and is accurate and complete. 08/03/2021   I,Shehryar Baig,acting as a scribe for [name], DO.,have documented all relevant documentation on the behalf of [name], DO,as directed by  [name], DO while in the presence of [name], DO.   Shehryar [employer]

## 2021-08-03 NOTE — Patient Instructions (Addendum)
Preventive Care 65 Years and Older, Male °Preventive care refers to lifestyle choices and visits with your health care provider that can promote health and wellness. Preventive care visits are also called wellness exams. °What can I expect for my preventive care visit? °Counseling °During your preventive care visit, your health care provider may ask about your: °Medical history, including: °Past medical problems. °Family medical history. °History of falls. °Current health, including: °Emotional well-being. °Home life and relationship well-being. °Sexual activity. °Memory and ability to understand (cognition). °Lifestyle, including: °Alcohol, nicotine or tobacco, and drug use. °Access to firearms. °Diet, exercise, and sleep habits. °Work and work environment. °Sunscreen use. °Safety issues such as seatbelt and bike helmet use. °Physical exam °Your health care provider will check your: °Height and weight. These may be used to calculate your BMI (body mass index). BMI is a measurement that tells if you are at a healthy weight. °Waist circumference. This measures the distance around your waistline. This measurement also tells if you are at a healthy weight and may help predict your risk of certain diseases, such as type 2 diabetes and high blood pressure. °Heart rate and blood pressure. °Body temperature. °Skin for abnormal spots. °What immunizations do I need? °Vaccines are usually given at various ages, according to a schedule. Your health care provider will recommend vaccines for you based on your age, medical history, and lifestyle or other factors, such as travel or where you work. °What tests do I need? °Screening °Your health care provider may recommend screening tests for certain conditions. This may include: °Lipid and cholesterol levels. °Diabetes screening. This is done by checking your blood sugar (glucose) after you have not eaten for a while (fasting). °Hepatitis C test. °Hepatitis B test. °HIV (human  immunodeficiency virus) test. °STI (sexually transmitted infection) testing, if you are at risk. °Lung cancer screening. °Colorectal cancer screening. °Prostate cancer screening. °Abdominal aortic aneurysm (AAA) screening. You may need this if you are a current or former smoker. °Talk with your health care provider about your test results, treatment options, and if necessary, the need for more tests. °Follow these instructions at home: °Eating and drinking ° °Eat a diet that includes fresh fruits and vegetables, whole grains, lean protein, and low-fat dairy products. Limit your intake of foods with high amounts of sugar, saturated fats, and salt. °Take vitamin and mineral supplements as recommended by your health care provider. °Do not drink alcohol if your health care provider tells you not to drink. °If you drink alcohol: °Limit how much you have to 0-2 drinks a day. °Know how much alcohol is in your drink. In the [place] one drink equals one 12 oz bottle of beer (355 mL), one 5 oz glass of wine (148 mL), or one 1½ oz glass of hard liquor (44 mL). °Lifestyle °Brush your teeth every morning and night with fluoride toothpaste. Floss one time each day. °Exercise for at least 30 minutes 5 or more days each week. °Do not use any products that contain nicotine or tobacco. These products include cigarettes, chewing tobacco, and vaping devices, such as e-cigarettes. If you need help quitting, ask your health care provider. °Do not use drugs. °If you are sexually active, practice safe sex. Use a condom or other form of protection to prevent STIs. °Take aspirin only as told by your health care provider. Make sure that you understand how much to take and what form to take. Work with your health care provider to find out whether it is safe and   beneficial for you to take aspirin daily. °Ask your health care provider if you need to take a cholesterol-lowering medicine (statin). °Find healthy ways to manage stress, such  as: °Meditation, yoga, or listening to music. °Journaling. °Talking to a trusted person. °Spending time with friends and family. °Safety °Always wear your seat belt while driving or riding in a vehicle. °Do not drive: °If you have been drinking alcohol. Do not ride with someone who has been drinking. °When you are tired or distracted. °While texting. °If you have been using any mind-altering substances or drugs. °Wear a helmet and other protective equipment during sports activities. °If you have firearms in your house, make sure you follow all gun safety procedures. °Minimize exposure to UV radiation to reduce your risk of skin cancer. °What's next? °Visit your health care provider once a year for an annual wellness visit. °Ask your health care provider how often you should have your eyes and teeth checked. °Stay up to date on all vaccines. °This information is not intended to replace advice given to you by your health care provider. Make sure you discuss any questions you have with your health care provider. °Document Revised: 11/19/2020 Document Reviewed: 11/19/2020 °Elsevier Patient Education © 2022 [employer] Inc. ° °

## 2021-08-04 DIAGNOSIS — E785 Hyperlipidemia, unspecified: Secondary | ICD-10-CM | POA: Diagnosis not present

## 2021-08-04 DIAGNOSIS — I2510 Atherosclerotic heart disease of native coronary artery without angina pectoris: Secondary | ICD-10-CM

## 2021-08-04 DIAGNOSIS — N1832 Chronic kidney disease, stage 3b: Secondary | ICD-10-CM

## 2021-08-04 DIAGNOSIS — Z7984 Long term (current) use of oral hypoglycemic drugs: Secondary | ICD-10-CM

## 2021-08-04 DIAGNOSIS — E1122 Type 2 diabetes mellitus with diabetic chronic kidney disease: Secondary | ICD-10-CM | POA: Diagnosis not present

## 2021-08-04 DIAGNOSIS — M8580 Other specified disorders of bone density and structure, unspecified site: Secondary | ICD-10-CM | POA: Diagnosis not present

## 2021-08-04 DIAGNOSIS — I129 Hypertensive chronic kidney disease with stage 1 through stage 4 chronic kidney disease, or unspecified chronic kidney disease: Secondary | ICD-10-CM

## 2021-08-04 DIAGNOSIS — E1159 Type 2 diabetes mellitus with other circulatory complications: Secondary | ICD-10-CM

## 2021-08-04 LAB — PSA: PSA: 0.02 ng/mL — ABNORMAL LOW (ref 0.10–4.00)

## 2021-08-10 ENCOUNTER — Telehealth: Payer: Self-pay | Admitting: Pharmacist

## 2021-08-10 NOTE — Chronic Care Management (AMB) (Unsigned)
Chronic Care Management Pharmacy Assistant   Name: [name]  MRN: [id number] DOB: 03-29-1932  Reason for Encounter: Disease State Hypertension  Recent office visits:  08/03/21-Yvonne Gibson Ramp, DO (PCP) Annual exam visit. Follow up in 6 months.  Recent consult visits:  None noted  Hospital visits:  Medication Reconciliation was completed by comparing discharge summary, patients EMR and Pharmacy list, and upon discussion with patient.  Admitted to the hospital on 07/27/21 due to Hypertension. Discharge date was 07/27/21. Discharged from [facility]?Medications Started at [facility] Discharge:?? -started None noted  Medication Changes at Hospital Discharge: -Changed None noted  Medications Discontinued at Hospital Discharge: -Stopped None noted  Medications that remain the same after Hospital Discharge:??  -All other medications will remain the same.    Medications: Outpatient Encounter Medications as of 08/10/2021  Medication Sig   Alcohol Swabs PADS Use as directed once a day   alendronate (FOSAMAX) 70 MG tablet TAKE 1 TABLET (70 MG TOTAL) ONCE A WEEK. TAKE WITH A FULL GLASS OF WATER ON AN EMPTY STOMACH.   amLODipine (NORVASC) 2.5 MG tablet Take 1 tablet (2.5 mg total) by mouth daily.   aspirin 81 MG tablet Take 81 mg by mouth daily.   atorvastatin (LIPITOR) 20 MG tablet Take 1 tablet (20 mg total) by mouth daily.   Blood Glucose Calibration (TRUE METRIX LEVEL 1) Low SOLN USE AS DIRECTED   Blood Glucose Calibration (TRUE METRIX LEVEL 3) High SOLN Use to check controls on glucometer strips every 30 days or with each new bottle of strips (whichever comes first).   Blood Glucose Monitoring Suppl (TRUE METRIX METER) w/Device KIT USE AS DIRECTED ONCE A DAY   brimonidine-timolol (COMBIGAN) 0.2-0.5 % ophthalmic solution Place 1 drop into both eyes 2 (two) times daily.   COLLAGEN PO Take by mouth. 1 scoop daily   fish oil-omega-3 fatty acids 1000  MG capsule Take 2 g by mouth daily.   glimepiride (AMARYL) 1 MG tablet Take 1 tablet (1 mg total) by mouth daily with breakfast.   glucose blood (TRUE METRIX BLOOD GLUCOSE TEST) test strip USE AS DIRECTED ONCE A DAY   losartan-hydrochlorothiazide (HYZAAR) 100-25 MG tablet Take 1 tablet by mouth daily.   metoprolol succinate (TOPROL-XL) 50 MG 24 hr tablet Take 1 tablet (50 mg total) by mouth daily. TAKE WITH OR IMMEDIATELY FOLLOWING A MEAL. (Patient not taking: Reported on 06/25/2021)   Multiple Vitamin (MULTIVITAMIN) tablet Take 1 tablet by mouth daily.   SitaGLIPtin-MetFORMIN HCl (JANUMET XR) 50-1000 MG TB24 Take 2 tablets by mouth daily.   tamsulosin (FLOMAX) 0.4 MG CAPS capsule TAKE 1 CAPSULE (0.4 MG TOTAL) BY MOUTH DAILY. (Patient not taking: Reported on 07/13/2021)   TRUEplus Lancets 33G MISC Use to check blood sugar once a day.  DX  E11.9   No facility-administered encounter medications on file as of 08/10/2021.   Current antihypertensive regimen:  Amlodipine 2.53m daily Losartan-hydrochlorothiazide 100-25 mg daily  Metoprolol succinate ER 529mdaily  How often are you checking your Blood Pressure? {CHL HP BP Monitoring [phone]}  Current home BP readings:    What recent interventions/DTPs have been made by any provider to improve Blood Pressure control since last CPP Visit: None noted  Any recent hospitalizations or ED visits since last visit with CPP? Yes  What diet changes have been made to improve Blood Pressure Control?    What exercise is being done to improve your Blood Pressure Control?  Adherence Review: Is the patient currently on ACE/ARB medication? Yes Does the patient have >5 day gap between last estimated fill dates? Yes   Star Rating Drugs: Janumet 50-1000 mg Last filled:03/15/21 90 DS Losartan-hydrochlorothiazide 100-25 mg Last filled:01/06/21 90 DS Atorvastatin 20 mg Last filled:01/06/21 90 DS  Myriam Elta [place], [place]

## 2021-08-14 ENCOUNTER — Other Ambulatory Visit: Payer: Self-pay

## 2021-08-14 ENCOUNTER — Ambulatory Visit (HOSPITAL_BASED_OUTPATIENT_CLINIC_OR_DEPARTMENT_OTHER)
Admission: RE | Admit: 2021-08-14 | Discharge: 2021-08-14 | Disposition: A | Discharge status: Home / Self Care | Payer: Medicare HMO | Source: Ambulatory Visit | Attending: Family Medicine | Admitting: Family Medicine

## 2021-08-14 DIAGNOSIS — I6522 Occlusion and stenosis of left carotid artery: Secondary | ICD-10-CM

## 2021-08-14 DIAGNOSIS — I6523 Occlusion and stenosis of bilateral carotid arteries: Secondary | ICD-10-CM | POA: Diagnosis not present

## 2021-08-18 ENCOUNTER — Encounter: Payer: Self-pay | Admitting: Internal Medicine

## 2021-08-18 ENCOUNTER — Ambulatory Visit (INDEPENDENT_AMBULATORY_CARE_PROVIDER_SITE_OTHER): Payer: Medicare HMO | Admitting: Internal Medicine

## 2021-08-18 ENCOUNTER — Other Ambulatory Visit: Payer: Self-pay

## 2021-08-18 VITALS — BP 126/72 | HR 76 | Ht 64.0 in | Wt 130.0 lb

## 2021-08-18 DIAGNOSIS — E1122 Type 2 diabetes mellitus with diabetic chronic kidney disease: Secondary | ICD-10-CM | POA: Diagnosis not present

## 2021-08-18 DIAGNOSIS — N1832 Chronic kidney disease, stage 3b: Secondary | ICD-10-CM

## 2021-08-18 DIAGNOSIS — E1165 Type 2 diabetes mellitus with hyperglycemia: Secondary | ICD-10-CM | POA: Diagnosis not present

## 2021-08-18 DIAGNOSIS — R739 Hyperglycemia, unspecified: Secondary | ICD-10-CM

## 2021-08-18 LAB — POCT GLUCOSE (DEVICE FOR HOME USE): Glucose Fasting, POC: 260 mg/dL — AB (ref 70–99)

## 2021-08-18 MED ORDER — EMPAGLIFLOZIN 10 MG PO TABS
10.0000 mg | ORAL_TABLET | Freq: Every day | ORAL | 1 refills | Status: DC
Start: 2021-08-18 — End: 2021-10-18

## 2021-08-18 MED ORDER — GLIMEPIRIDE 2 MG PO TABS: 2.0000 mg | ORAL_TABLET | Freq: Every day | ORAL | 3 refills | Status: DC

## 2021-08-18 NOTE — Progress Notes (Signed)
?   ?Name: [name]  ?Age/ Sex: 88 y.o., male   ?MRN/ DOB: [id number], [birth year]    ? ?PCP: [name], DO   ?Reason for Endocrinology Evaluation: Type 2 Diabetes Mellitus  ?Initial Endocrine Consultative Visit: 02/06/2020  ? ? ?PATIENT IDENTIFIER: [name] is a 88 y.o. male with a past medical history of T2DM, CAD and HTN. The patient has followed with Endocrinology clinic since 02/06/2020 for consultative assistance with management of his diabetes. ? ?DIABETIC HISTORY:  ?[name] was diagnosed with DM in 2008, has been on Glimepiride for years, janumet started in 01/2020. His hemoglobin A1c has ranged from 6.7% in 2017, peaking at 9.6%in 2021. ? ? ?On initial visit his A1c was 9.6 % , he was just started on Janumet and we did not make any changes and continued Glimepiride.  ? ?SUBJECTIVE:  ? ?During the last visit (02/17/2021): A1c 7.2 %. We continued  Janumet  And decreased Glimepiride  ? ?Today (08/18/2021): [name] is here for a follow up on diabetes management.  He checks his blood sugars 1 times daily, preprandial to breakfast. The patient has hypoglycemic episodes since the last clinic visit, less frequent then in the past  ? ?Presented to the ED with hypertensive urgency 07/27/2021 ?Was seen by rheumatology 05/04/2021 for osteoporosis - On Alendronate  ?Denies nausea , vomiting or diarrhea ?Has noted tingling and numbness of hands and feet  ? ? ? ?HOME DIABETES REGIMEN:  ?Glimepiride 1 mg daily  ?Janumet 50-1000 mg, 2 tabs daily  ? ? ? ?Statin: yes ?ACE-I/ARB: yes ? ? ?GLUCOSE LOG: ?Not available  ? ? ?In-office BG 260 mg/dL  ? ? ?DIABETIC COMPLICATIONS: ?Microvascular complications:  ?CKD III ?Denies: retinopathy, neuropathy ?Last Eye Exam: Completed 07/2020 ? ?Macrovascular complications:  ?CAD ( S/P stent) ?Denies:  CVA, PVD ? ? ?HISTORY:  ?Past Medical History:  ?Past Medical History:  ?Diagnosis Date  ? CAD (coronary artery disease)   ? LHC 03/25/11 by Dr. Burt Knack:  pLAD 99%, oCFX 20-30%,  pOM1 40%, dAVCFX 20-30%.  EF was normal on nuclear study.  He was treated with a Promus DES to his pLAD.   ? Colon polyps 1996  ? villous adenoma  ? DM type 2 (diabetes mellitus, type 2) ([place]) 2002  ? Fuchs' corneal dystrophy   ? Hyperlipidemia   ? Hypertension   ? Macular degeneration   ? posterior vitreous vitreous detachment  ? Prostate cancer [facility]) dx 2018  ? ?Past Surgical History:  ?Past Surgical History:  ?Procedure Laterality Date  ? CARDIAC CATHETERIZATION  03/25/2011  ? cataract Bilateral 2010  ? corenea implants  june and sept 2018  ? [name]  ? CORONARY STENT PLACEMENT  03/25/2011  ? RADIOACTIVE SEED IMPLANT N/A 05/06/2017  ? Procedure: RADIOACTIVE SEED IMPLANT/BRACHYTHERAPY IMPLANT;  Surgeon: Kathie Rhodes, MD;  Location[facility];  Service: Urology;  Laterality: N/A;  ? SPACE OAR INSTILLATION N/A 05/06/2017  ? Procedure: SPACE OAR INSTILLATION;  Surgeon: Kathie Rhodes, MD;  Location: [facility];  Service: Urology;  Laterality: N/A;  ? ?Social History:  reports that he quit smoking about 63 years ago. His smoking use included cigarettes. He has a 0.75 pack-year smoking history. He has never used smokeless tobacco. He reports that he does not currently use alcohol. He reports that he does not use drugs. ?Family History:  ?Family History  ?Problem Relation Age of Onset  ? Stomach cancer Mother 68  ?  Coronary artery disease Father 54  ?     deceased  ? Healthy Son   ? Healthy Son   ? Healthy Son   ? ? ? ?HOME MEDICATIONS: ?Allergies as of 08/18/2021   ?No Known Allergies ?  ? ?  ?Medication List  ?  ? ?  ? Accurate as of August 18, 2021  4:25 PM. If you have any questions, ask your nurse or doctor.  ?  ?  ? ?  ? ?STOP taking these medications   ? ?Janumet XR 50-1000 MG Tb24 ?Generic drug: SitaGLIPtin-MetFORMIN HCl ?Stopped by: Dorita Sciara, MD ?  ? ?  ? ?TAKE these medications   ? ?Alcohol Swabs Pads ?Use as directed once a day ?  ?alendronate 70 MG  tablet ?Commonly known as: FOSAMAX ?TAKE 1 TABLET (70 MG TOTAL) ONCE A WEEK. TAKE WITH A FULL GLASS OF WATER ON AN EMPTY STOMACH. ?  ?amLODipine 2.5 MG tablet ?Commonly known as: NORVASC ?Take 1 tablet (2.5 mg total) by mouth daily. ?  ?aspirin 81 MG tablet ?Take 81 mg by mouth daily. ?  ?atorvastatin 20 MG tablet ?Commonly known as: LIPITOR ?Take 1 tablet (20 mg total) by mouth daily. ?  ?brimonidine-timolol 0.2-0.5 % ophthalmic solution ?Commonly known as: COMBIGAN ?Place 1 drop into both eyes 2 (two) times daily. ?  ?COLLAGEN PO ?Take by mouth. 1 scoop daily ?  ?empagliflozin 10 MG Tabs tablet ?Commonly known as: Jardiance ?Take 1 tablet (10 mg total) by mouth daily before breakfast. ?Started by: Dorita Sciara, MD ?  ?fish oil-omega-3 fatty acids 1000 MG capsule ?Take 2 g by mouth daily. ?  ?glimepiride 2 MG tablet ?Commonly known as: AMARYL ?Take 1 tablet (2 mg total) by mouth daily before breakfast. ?What changed:  ?medication strength ?how much to take ?when to take this ?Changed by: Dorita Sciara, MD ?  ?losartan-hydrochlorothiazide 100-25 MG tablet ?Commonly known as: HYZAAR ?Take 1 tablet by mouth daily. ?  ?metoprolol succinate 50 MG 24 hr tablet ?Commonly known as: TOPROL-XL ?Take 1 tablet (50 mg total) by mouth daily. TAKE WITH OR IMMEDIATELY FOLLOWING A MEAL. ?  ?multivitamin tablet ?Take 1 tablet by mouth daily. ?  ?tamsulosin 0.4 MG Caps capsule ?Commonly known as: FLOMAX ?TAKE 1 CAPSULE (0.4 MG TOTAL) BY MOUTH DAILY. ?  ?True Metrix Blood Glucose Test test strip ?Generic drug: glucose blood ?USE AS DIRECTED ONCE A DAY ?  ?True Metrix Level 3 High Soln ?Use to check controls on glucometer strips every 30 days or with each new bottle of strips (whichever comes first). ?  ?True Metrix Level 1 Low Soln ?USE AS DIRECTED ?  ?True Metrix Meter w/Device Kit ?USE AS DIRECTED ONCE A DAY ?  ?TRUEplus Lancets 33G Misc ?Use to check blood sugar once a day.  DX  E11.9 ?  ? ?  ? ? ? ?OBJECTIVE:   ? ?Vital Signs: BP 126/72 (BP Location: Left Arm, Patient Position: Sitting, Cuff Size: Small)   Pulse 76   Ht 5' 4" (1.626 m)   Wt 130 lb (59 kg)   SpO2 98%   BMI 22.31 kg/m?   ?Wt Readings from Last 3 Encounters:  ?08/18/21 130 lb (59 kg)  ?08/03/21 128 lb (58.1 kg)  ?07/27/21 128 lb 1.4 oz (58.1 kg)  ? ? ? ?Exam: ?General: Pt appears well and is in NAD  ?Lungs: Clear with good BS bilat with no rales, rhonchi, or wheezes  ?Heart: RRR with normal S1 and  S2 and no gallops; no murmurs; no rub  ?Extremities: No pretibial edema.[name]  ?Neuro: MS is good with appropriate affect, pt is alert and Ox3  ? ?DM foot exam: 02/17/2021 ?  ?The skin of the feet is intact without sores or ulcerations. ?The pedal pulses are 1+ on right and 1+ on left. ?The sensation is intact to a screening 5.07, 10 gram monofilament on the right  ?  ?   ? ? ? ?DATA REVIEWED: ? ?Lab Results  ?Component Value Date  ? HGBA1C 9.9 (H) 08/03/2021  ? HGBA1C 7.1 (A) 02/17/2021  ? HGBA1C 7.2 (H) 07/31/2020  ? ?Results for [name], HERMIZ (MRN [id number]) as of 02/17/2021 08:23 ? Ref. Range 01/28/2021 08:40  ?Sodium Latest Ref Range: 135 - 146 mmol/L 141  ?Potassium Latest Ref Range: 3.5 - 5.3 mmol/L 4.5  ?Chloride Latest Ref Range: 98 - 110 mmol/L 104  ?CO2 Latest Ref Range: 20 - 32 mmol/L 31  ?Glucose Latest Ref Range: 65 - 99 mg/dL 99  ?BUN Latest Ref Range: 7 - 25 mg/dL 24  ?Creatinine Latest Ref Range: 0.70 - 1.22 mg/dL 1.47 (H)  ?Calcium Latest Ref Range: 8.6 - 10.3 mg/dL 9.8  ?BUN/Creatinine Ratio Latest Ref Range: 6 - 22 (calc) 16  ?Vitamin D, 25-Hydroxy Latest Ref Range: 30 - 100 ng/mL 80  ?PTH, Intact Latest Ref Range: 16 - 77 pg/mL 33  ? ?04/04/2020 ?MA/Cr ratio 152 ?ASSESSMENT / PLAN / RECOMMENDATIONS:  ? ?1) Type 2 Diabetes Mellitus, poorly controlled, with CKD III and neuropathic and macrovascular complications - Most recent A1c of 9.9 %. Goal A1c < 7.5 %.   ? ? ?-[name]'s A1c has increased from 7.% to 9.9% with just reducing his glimepiride from  2 mg to 1 mg?[name]  He assures me compliance, he also assures me that there is no dietary changes ?-I am going to stop his Janumet due to a GFR of less than 45 ?-We discussed adding SGLT2 inhibitors, discussed cardiovas

## 2021-08-18 NOTE — Patient Instructions (Addendum)
-   Increase Amaryl to 2  mg daily before Breakfast  ?- Stop  Janumet  ?- Start Jardiance 10 mg , 1 tablet every morning  ? ? ? ?Check sugar before you run, if its less then 150 , please eat a protein bar, if your sugars drop despite the protein bar, do NOT take Amaryl on the day that you run but TAKE Jardiance  ? ? ? ? ?HOW TO TREAT LOW BLOOD SUGARS (Blood sugar LESS THAN 70 MG/DL) ?Please follow the RULE OF 15 for the treatment of hypoglycemia treatment (when your (blood sugars are less than 70 mg/dL)  ? ?STEP 1: Take 15 grams of carbohydrates when your blood sugar is low, which includes:  ?3-4 GLUCOSE TABS  OR ?3-4 OZ OF JUICE OR REGULAR SODA OR ?ONE TUBE OF GLUCOSE GEL   ? ?STEP 2: RECHECK blood sugar in 15 MINUTES ?STEP 3: If your blood sugar is still low at the 15 minute recheck --> then, go back to STEP 1 and treat AGAIN with another 15 grams of carbohydrates. ? ?

## 2021-08-21 ENCOUNTER — Other Ambulatory Visit: Payer: Self-pay

## 2021-08-21 DIAGNOSIS — I6523 Occlusion and stenosis of bilateral carotid arteries: Secondary | ICD-10-CM

## 2021-09-07 DIAGNOSIS — H401112 Primary open-angle glaucoma, right eye, moderate stage: Secondary | ICD-10-CM | POA: Diagnosis not present

## 2021-09-07 DIAGNOSIS — H401121 Primary open-angle glaucoma, left eye, mild stage: Secondary | ICD-10-CM | POA: Diagnosis not present

## 2021-09-15 ENCOUNTER — Ambulatory Visit (INDEPENDENT_AMBULATORY_CARE_PROVIDER_SITE_OTHER): Payer: Medicare HMO

## 2021-09-15 DIAGNOSIS — Z0000 Encounter for general adult medical examination without abnormal findings: Secondary | ICD-10-CM | POA: Diagnosis not present

## 2021-09-15 NOTE — Progress Notes (Signed)
? ?Subjective:  ? [name] is a 88 y.o. male who presents for Medicare Annual/Subsequent preventive examination. ? ?I connected with  [name] on 09/15/21 by a audio enabled telemedicine application and verified that I am speaking with the correct person using two identifiers. ? ?Patient Location: Home ? ?Provider Location: Office/Clinic ? ?I discussed the limitations of evaluation and management by telemedicine. The patient expressed understanding and agreed to proceed.  ? ?Review of Systems    ? ?Cardiac Risk Factors include: advanced age (>45mn, >>70women);diabetes mellitus;dyslipidemia;hypertension ? ?   ?Objective:  ?  ?There were no vitals filed for this visit. ?There is no height or weight on file to calculate BMI. ? ? ?  09/15/2021  ?  9:51 AM 07/27/2021  ? 11:25 AM 10/01/2020  ?  8:40 PM 09/11/2020  ?  8:46 AM 07/24/2020  ? 10:19 AM 03/24/2020  ? 11:03 AM 09/10/2019  ?  8:13 AM  ?Advanced Directives  ?Does Patient Have a Medical Advance Directive? Yes Yes Yes Yes No No Yes  ?Type of [place] (pink MOST or yellow form);Living will [place] will  [place] will   [place]  ?Does patient want to make changes to medical advance directive?       No - Patient declined  ?Copy of HO[place]uin Chart? No - copy requested   No - copy requested   No - copy requested  ?Would patient like information on creating a medical advance directive?  No - Patient declined   No - Patient declined Yes (MAU/Ambulatory/Procedural Areas - Information given)   ? ? ?Current Medications (verified) ?Outpatient Encounter Medications as of 09/15/2021  ?Medication Sig  ? Alcohol Swabs PADS Use as directed once a day  ? alendronate (FOSAMAX) 70 MG tablet TAKE 1 TABLET (70 MG TOTAL) ONCE A WEEK. TAKE WITH A FULL GLASS OF WATER ON AN EMPTY STOMACH.  ? amLODipine (NORVASC) 2.5 MG tablet  Take 1 tablet (2.5 mg total) by mouth daily.  ? aspirin 81 MG tablet Take 81 mg by mouth daily.  ? atorvastatin (LIPITOR) 20 MG tablet Take 1 tablet (20 mg total) by mouth daily.  ? Blood Glucose Calibration (TRUE METRIX LEVEL 1) Low SOLN USE AS DIRECTED  ? Blood Glucose Calibration (TRUE METRIX LEVEL 3) High SOLN Use to check controls on glucometer strips every 30 days or with each new bottle of strips (whichever comes first).  ? Blood Glucose Monitoring Suppl (TRUE METRIX METER) w/Device KIT USE AS DIRECTED ONCE A DAY  ? brimonidine-timolol (COMBIGAN) 0.2-0.5 % ophthalmic solution Place 1 drop into both eyes 2 (two) times daily.  ? COLLAGEN PO Take by mouth. 1 scoop daily  ? empagliflozin (JARDIANCE) 10 MG TABS tablet Take 1 tablet (10 mg total) by mouth daily before breakfast.  ? fish oil-omega-3 fatty acids 1000 MG capsule Take 2 g by mouth daily.  ? glimepiride (AMARYL) 2 MG tablet Take 1 tablet (2 mg total) by mouth daily before breakfast.  ? glucose blood (TRUE METRIX BLOOD GLUCOSE TEST) test strip USE AS DIRECTED ONCE A DAY  ? losartan-hydrochlorothiazide (HYZAAR) 100-25 MG tablet Take 1 tablet by mouth daily.  ? metoprolol succinate (TOPROL-XL) 50 MG 24 hr tablet Take 1 tablet (50 mg total) by mouth daily. TAKE WITH OR IMMEDIATELY FOLLOWING A MEAL.  ? Multiple Vitamin (MULTIVITAMIN) tablet Take 1 tablet by mouth  daily.  ? tamsulosin (FLOMAX) 0.4 MG CAPS capsule TAKE 1 CAPSULE (0.4 MG TOTAL) BY MOUTH DAILY.  ? TRUEplus Lancets 33G MISC Use to check blood sugar once a day.  DX  E11.9  ? ?No facility-administered encounter medications on file as of 09/15/2021.  ? ? ?Allergies (verified) ?Patient has no known allergies.  ? ?History: ?Past Medical History:  ?Diagnosis Date  ? CAD (coronary artery disease)   ? LHC 03/25/11 by Dr. Burt Knack:  pLAD 99%, oCFX 20-30%, pOM1 40%, dAVCFX 20-30%.  EF was normal on nuclear study.  He was treated with a Promus DES to his pLAD.   ? Colon polyps 1996  ? villous adenoma  ? DM  type 2 (diabetes mellitus, type 2) ([place]) 2002  ? Fuchs' corneal dystrophy   ? Hyperlipidemia   ? Hypertension   ? Macular degeneration   ? posterior vitreous vitreous detachment  ? Prostate cancer [facility]) dx 2018  ? ?Past Surgical History:  ?Procedure Laterality Date  ? CARDIAC CATHETERIZATION  03/25/2011  ? cataract Bilateral 2010  ? corenea implants  june and sept 2018  ? [name]  ? CORONARY STENT PLACEMENT  03/25/2011  ? RADIOACTIVE SEED IMPLANT N/A 05/06/2017  ? Procedure: RADIOACTIVE SEED IMPLANT/BRACHYTHERAPY IMPLANT;  Surgeon: Kathie Rhodes, MD;  [facility];  Service: Urology;  Laterality: N/A;  ? SPACE OAR INSTILLATION N/A 05/06/2017  ? Procedure: SPACE OAR INSTILLATION;  Surgeon: Kathie Rhodes, MD;  Location: [facility];  Service: Urology;  Laterality: N/A;  ? ?Family History  ?Problem Relation Age of Onset  ? Stomach cancer Mother 75  ? Coronary artery disease Father 82  ?     deceased  ? Healthy Son   ? Healthy Son   ? Healthy Son   ? ?Social History  ? ?Socioeconomic History  ? Marital status: Widowed  ?  Spouse name: Not on file  ? Number of children: Not on file  ? Years of education: Not on file  ? Highest education level: Not on file  ?Occupational History  ? Occupation: retired  ?Tobacco Use  ? Smoking status: Former  ?  Packs/day: 0.25  ?  Years: 3.00  ?  Pack years: 0.75  ?  Types: Cigarettes  ?  Quit date: 06/07/1958  ?  Years since quitting: 63.3  ? Smokeless tobacco: Never  ?Vaping Use  ? Vaping Use: Never used  ?Substance and Sexual Activity  ? Alcohol use: Not Currently  ? Drug use: No  ? Sexual activity: Not Currently  ?Other Topics Concern  ? Not on file  ?Social History Narrative  ? Not on file  ? ?Social Determinants of Health  ? ?Financial Resource Strain: Low Risk   ? Difficulty of Paying Living Expenses: Not hard at all  ?Food Insecurity: No Food Insecurity  ? Worried About Charity fundraiser in the Last Year: Never true  ? Ran Out of  Food in the Last Year: Never true  ?Transportation Needs: No Transportation Needs  ? Lack of Transportation (Medical): No  ? Lack of Transportation (Non-Medical): No  ?Physical Activity: Sufficiently Active  ? Days of Exercise per Week: 7 days  ? Minutes of Exercise per Session: 30 min  ?Stress: No Stress Concern Present  ? Feeling of Stress : Not at all  ?Social Connections: Moderately Integrated  ? Frequency of Communication with Friends and Family: More than three times a week  ? Frequency of Social Gatherings with Friends  and Family: Twice a week  ? Attends Religious Services: More than 4 times per year  ? Active Member of Clubs or Organizations: Yes  ? Attends Archivist Meetings: More than 4 times per year  ? Marital Status: Widowed  ? ? ?Tobacco Counseling ?Counseling given: Not Answered ? ? ?Clinical Intake: ? ?Pre-visit preparation completed: Yes ? ?Pain : No/denies pain ? ?  ? ?Nutritional Risks: None ?Diabetes: Yes ?CBG done?: No ?Did pt. bring in CBG monitor from home?: No ? ?How often do you need to have someone help you when you read instructions, pamphlets, or other written materials from your doctor or pharmacy?: 1 - Never ? ?Diabetic?Yes ?Nutrition Risk Assessment: ? ?Has the patient had any N/V/D within the last 2 months?  No  ?Does the patient have any non-healing wounds?  No  ?Has the patient had any unintentional weight loss or weight gain?  No  ? ?Diabetes: ? ?Is the patient diabetic?  Yes  ?If diabetic, was a CBG obtained today?  No  ?Did the patient bring in their glucometer from home?  No  ?How often do you monitor your CBG's? Once daily .  ? ?Financial Strains and Diabetes Management: ? ?Are you having any financial strains with the device, your supplies or your medication? No .  ?Does the patient want to be seen by Chronic Care Management for management of their diabetes?   Scheduled ?Would the patient like to be referred to a Nutritionist or for Diabetic Management?  No   ? ?Diabetic Exams: ? ?Diabetic Eye Exam: Completed per pt ?Diabetic Foot Exam: Completed 08/03/21   ? ?Interpreter Needed?: No ? ?Information entered by :: Saranya Harlin ? ? ?Activities of Daily Living ? ?  4/11

## 2021-09-15 NOTE — Patient Instructions (Signed)
[name] , ?Thank you for taking time to come for your Medicare Wellness Visit. I appreciate your ongoing commitment to your health goals. Please review the following plan we discussed and let me know if I can assist you in the future.  ? ?Screening recommendations/referrals: ?Colonoscopy: no longer needed ?Recommended yearly ophthalmology/optometry visit for glaucoma screening and checkup ?Recommended yearly dental visit for hygiene and checkup ? ?Vaccinations: ?Influenza vaccine: up to date ?Pneumococcal vaccine: up to date ?Tdap vaccine: up to date per pt ?Shingles vaccine: up to date per pt   ?Covid-19: completed per pt ? ?Advanced directives: yes, not on file ? ?Conditions/risks identified: see problem list  ? ?Next appointment: Follow up in one year for your annual wellness visit. 09/17/22 ? ?Preventive Care 86 Years and Older, Male ?Preventive care refers to lifestyle choices and visits with your health care provider that can promote health and wellness. ?What does preventive care include? ?A yearly physical exam. This is also called an annual well check. ?Dental exams once or twice a year. ?Routine eye exams. Ask your health care provider how often you should have your eyes checked. ?Personal lifestyle choices, including: ?Daily care of your teeth and gums. ?Regular physical activity. ?Eating a healthy diet. ?Avoiding tobacco and drug use. ?Limiting alcohol use. ?Practicing safe sex. ?Taking low doses of aspirin every day. ?Taking vitamin and mineral supplements as recommended by your health care provider. ?What happens during an annual well check? ?The services and screenings done by your health care provider during your annual well check will depend on your age, overall health, lifestyle risk factors, and family history of disease. ?Counseling  ?Your health care provider may ask you questions about your: ?Alcohol use. ?Tobacco use. ?Drug use. ?Emotional well-being. ?Home and relationship well-being. ?Sexual  activity. ?Eating habits. ?History of falls. ?Memory and ability to understand (cognition). ?Work and work Statistician. ?Screening  ?You may have the following tests or measurements: ?Height, weight, and BMI. ?Blood pressure. ?Lipid and cholesterol levels. These may be checked every 5 years, or more frequently if you are over 83 years old. ?Skin check. ?Lung cancer screening. You may have this screening every year starting at age 35 if you have a 30-pack-year history of smoking and currently smoke or have quit within the past 15 years. ?Fecal occult blood test (FOBT) of the stool. You may have this test every year starting at age 21. ?Flexible sigmoidoscopy or colonoscopy. You may have a sigmoidoscopy every 5 years or a colonoscopy every 10 years starting at age 33. ?Prostate cancer screening. Recommendations will vary depending on your family history and other risks. ?Hepatitis C blood test. ?Hepatitis B blood test. ?Sexually transmitted disease (STD) testing. ?Diabetes screening. This is done by checking your blood sugar (glucose) after you have not eaten for a while (fasting). You may have this done every 1-3 years. ?Abdominal aortic aneurysm (AAA) screening. You may need this if you are a current or former smoker. ?Osteoporosis. You may be screened starting at age 26 if you are at high risk. ?Talk with your health care provider about your test results, treatment options, and if necessary, the need for more tests. ?Vaccines  ?Your health care provider may recommend certain vaccines, such as: ?Influenza vaccine. This is recommended every year. ?Tetanus, diphtheria, and acellular pertussis (Tdap, Td) vaccine. You may need a Td booster every 10 years. ?Zoster vaccine. You may need this after age 81. ?Pneumococcal 13-valent conjugate (PCV13) vaccine. One dose is recommended after age 72. ?Pneumococcal  polysaccharide (PPSV23) vaccine. One dose is recommended after age 64. ?Talk to your health care provider about which  screenings and vaccines you need and how often you need them. ?This information is not intended to replace advice given to you by your health care provider. Make sure you discuss any questions you have with your health care provider. ?Document Released: 06/20/2015 Document Revised: 02/11/2016 Document Reviewed: 03/25/2015 ?Elsevier Interactive Patient Education ? 2017 [employer]. ? ?Fall Prevention in the Home ?Falls can cause injuries. They can happen to people of all ages. There are many things you can do to make your home safe and to help prevent falls. ?What can I do on the outside of my home? ?Regularly fix the edges of walkways and driveways and fix any cracks. ?Remove anything that might make you trip as you walk through a door, such as a raised step or threshold. ?Trim any bushes or trees on the path to your home. ?Use bright outdoor lighting. ?Clear any walking paths of anything that might make someone trip, such as rocks or tools. ?Regularly check to see if handrails are loose or broken. Make sure that both sides of any steps have handrails. ?Any raised decks and porches should have guardrails on the edges. ?Have any leaves, snow, or ice cleared regularly. ?Use sand or salt on walking paths during winter. ?Clean up any spills in your garage right away. This includes oil or grease spills. ?What can I do in the bathroom? ?Use night lights. ?Install grab bars by the toilet and in the tub and shower. Do not use towel bars as grab bars. ?Use non-skid mats or decals in the tub or shower. ?If you need to sit down in the shower, use a plastic, non-slip stool. ?Keep the floor dry. Clean up any water that spills on the floor as soon as it happens. ?Remove soap buildup in the tub or shower regularly. ?Attach bath mats securely with double-sided non-slip rug tape. ?Do not have throw rugs and other things on the floor that can make you trip. ?What can I do in the bedroom? ?Use night lights. ?Make sure that you have a  light by your bed that is easy to reach. ?Do not use any sheets or blankets that are too big for your bed. They should not hang down onto the floor. ?Have a firm chair that has side arms. You can use this for support while you get dressed. ?Do not have throw rugs and other things on the floor that can make you trip. ?What can I do in the kitchen? ?Clean up any spills right away. ?Avoid walking on wet floors. ?Keep items that you use a lot in easy-to-reach places. ?If you need to reach something above you, use a strong step stool that has a grab bar. ?Keep electrical cords out of the way. ?Do not use floor polish or wax that makes floors slippery. If you must use wax, use non-skid floor wax. ?Do not have throw rugs and other things on the floor that can make you trip. ?What can I do with my stairs? ?Do not leave any items on the stairs. ?Make sure that there are handrails on both sides of the stairs and use them. Fix handrails that are broken or loose. Make sure that handrails are as long as the stairways. ?Check any carpeting to make sure that it is firmly attached to the stairs. Fix any carpet that is loose or worn. ?Avoid having throw rugs at the top  or bottom of the stairs. If you do have throw rugs, attach them to the floor with carpet tape. ?Make sure that you have a light switch at the top of the stairs and the bottom of the stairs. If you do not have them, ask someone to add them for you. ?What else can I do to help prevent falls? ?Wear shoes that: ?Do not have high heels. ?Have rubber bottoms. ?Are comfortable and fit you well. ?Are closed at the toe. Do not wear sandals. ?If you use a stepladder: ?Make sure that it is fully opened. Do not climb a closed stepladder. ?Make sure that both sides of the stepladder are locked into place. ?Ask someone to hold it for you, if possible. ?Clearly mark and make sure that you can see: ?Any grab bars or handrails. ?First and last steps. ?Where the edge of each step  is. ?Use tools that help you move around (mobility aids) if they are needed. These include: ?Canes. ?Walkers. ?Scooters. ?Crutches. ?Turn on the lights when you go into a dark area. Replace any light bu

## 2021-09-17 ENCOUNTER — Ambulatory Visit: Payer: Medicare HMO

## 2021-09-24 ENCOUNTER — Encounter: Payer: Medicare HMO | Admitting: Vascular Surgery

## 2021-10-05 ENCOUNTER — Encounter: Payer: Self-pay | Admitting: Surgery

## 2021-10-05 ENCOUNTER — Ambulatory Visit: Payer: Medicare HMO | Admitting: Surgery

## 2021-10-05 VITALS — BP 175/65 | HR 60 | Temp 98.1°F | Resp 20 | Ht 64.0 in | Wt 125.0 lb

## 2021-10-05 DIAGNOSIS — I6523 Occlusion and stenosis of bilateral carotid arteries: Secondary | ICD-10-CM

## 2021-10-05 NOTE — Progress Notes (Signed)
? ?[facility] ? ?Patient name: [name] MRN: [id number] DOB: 03-Jul-1931 Sex: male ? ? ?REQUESTING PROVIDER:  ? ? Dr. Etter Sjogren ? ? ?REASON FOR CONSULT:  ?  ?Carotid disease ? ?HISTORY OF PRESENT ILLNESS:  ? ?[name] is a 88 y.o. male, who is referred for evaluation of carotid stenosis.  I bruit was auscultated and so the patient was sent for ultrasound.  He is asymptomatic.  Specifically he denies numbness or weakness in either extremity.  He denies slurred speech.  He denies amaurosis fugax ? ?Patient has a history of coronary artery disease, status post PCI.  He suffers from type 2 diabetes.  He is medically managed for hypertension.  He takes a statin for hypercholesterolemia. ? ?The patient is a active runner and competes in competition, having just 1 several gold metals.  He eats very healthy.  He is a non-smoker. ? ?PAST MEDICAL HISTORY  ? ? ?Past Medical History:  ?Diagnosis Date  ? CAD (coronary artery disease)   ? LHC 03/25/11 by Dr. Burt Knack:  pLAD 99%, oCFX 20-30%, pOM1 40%, dAVCFX 20-30%.  EF was normal on nuclear study.  He was treated with a Promus DES to his pLAD.   ? Colon polyps 1996  ? villous adenoma  ? DM type 2 (diabetes mellitus, type 2) ([place]) 2002  ? Fuchs' corneal dystrophy   ? Hyperlipidemia   ? Hypertension   ? Macular degeneration   ? posterior vitreous vitreous detachment  ? Prostate cancer [facility]) dx 2018  ? ? ? ?FAMILY HISTORY  ? ?Family History  ?Problem Relation Age of Onset  ? Stomach cancer Mother 83  ? Coronary artery disease Father 30  ?     deceased  ? Healthy Son   ? Healthy Son   ? Healthy Son   ? ? ?SOCIAL HISTORY:  ? ?Social History  ? ?Socioeconomic History  ? Marital status: Widowed  ?  Spouse name: Not on file  ? Number of children: Not on file  ? Years of education: Not on file  ? Highest education level: Not on file  ?Occupational History  ? Occupation: retired  ?Tobacco Use  ? Smoking status: Former  ?   Packs/day: 0.25  ?  Years: 3.00  ?  Pack years: 0.75  ?  Types: Cigarettes  ?  Quit date: 06/07/1958  ?  Years since quitting: 63.3  ? Smokeless tobacco: Never  ?Vaping Use  ? Vaping Use: Never used  ?Substance and Sexual Activity  ? Alcohol use: Not Currently  ? Drug use: No  ? Sexual activity: Not Currently  ?Other Topics Concern  ? Not on file  ?Social History Narrative  ? Not on file  ? ?Social Determinants of Health  ? ?Financial Resource Strain: Low Risk   ? Difficulty of Paying Living Expenses: Not hard at all  ?Food Insecurity: No Food Insecurity  ? Worried About Charity fundraiser in the Last Year: Never true  ? Ran Out of Food in the Last Year: Never true  ?Transportation Needs: No Transportation Needs  ? Lack of Transportation (Medical): No  ? Lack of Transportation (Non-Medical): No  ?Physical Activity: Sufficiently Active  ? Days of Exercise per Week: 7 days  ? Minutes of Exercise per Session: 30 min  ?Stress: No Stress Concern Present  ? Feeling of Stress : Not at all  ?Social Connections: Moderately Integrated  ? Frequency of Communication with Friends and Family: More  than three times a week  ? Frequency of Social Gatherings with Friends and Family: Twice a week  ? Attends Religious Services: More than 4 times per year  ? Active Member of Clubs or Organizations: Yes  ? Attends Archivist Meetings: More than 4 times per year  ? Marital Status: Widowed  ?Intimate Partner Violence: Not At Risk  ? Fear of Current or Ex-Partner: No  ? Emotionally Abused: No  ? Physically Abused: No  ? Sexually Abused: No  ? ? ?ALLERGIES:  ? ? ?No Known Allergies ? ?CURRENT MEDICATIONS:  ? ? ?Current Outpatient Medications  ?Medication Sig Dispense Refill  ? Alcohol Swabs PADS Use as directed once a day 100 each 1  ? alendronate (FOSAMAX) 70 MG tablet TAKE 1 TABLET (70 MG TOTAL) ONCE A WEEK. TAKE WITH A FULL GLASS OF WATER ON AN EMPTY STOMACH. 12 tablet 2  ? amLODipine (NORVASC) 2.5 MG tablet Take 1 tablet (2.5 mg  total) by mouth daily. 90 tablet 1  ? aspirin 81 MG tablet Take 81 mg by mouth daily.    ? atorvastatin (LIPITOR) 20 MG tablet Take 1 tablet (20 mg total) by mouth daily. 90 tablet 1  ? Blood Glucose Calibration (TRUE METRIX LEVEL 1) Low SOLN USE AS DIRECTED 1 each 3  ? Blood Glucose Calibration (TRUE METRIX LEVEL 3) High SOLN Use to check controls on glucometer strips every 30 days or with each new bottle of strips (whichever comes first). 3 each 1  ? Blood Glucose Monitoring Suppl (TRUE METRIX METER) w/Device KIT USE AS DIRECTED ONCE A DAY 1 kit 0  ? brimonidine-timolol (COMBIGAN) 0.2-0.5 % ophthalmic solution Place 1 drop into both eyes 2 (two) times daily.    ? COLLAGEN PO Take by mouth. 1 scoop daily    ? empagliflozin (JARDIANCE) 10 MG TABS tablet Take 1 tablet (10 mg total) by mouth daily before breakfast. 90 tablet 1  ? fish oil-omega-3 fatty acids 1000 MG capsule Take 2 g by mouth daily.    ? glimepiride (AMARYL) 2 MG tablet Take 1 tablet (2 mg total) by mouth daily before breakfast. 30 tablet 3  ? glucose blood (TRUE METRIX BLOOD GLUCOSE TEST) test strip USE AS DIRECTED ONCE A DAY 100 strip 3  ? losartan-hydrochlorothiazide (HYZAAR) 100-25 MG tablet Take 1 tablet by mouth daily. 90 tablet 1  ? metoprolol succinate (TOPROL-XL) 50 MG 24 hr tablet Take 1 tablet (50 mg total) by mouth daily. TAKE WITH OR IMMEDIATELY FOLLOWING A MEAL. 90 tablet 1  ? Multiple Vitamin (MULTIVITAMIN) tablet Take 1 tablet by mouth daily.    ? tamsulosin (FLOMAX) 0.4 MG CAPS capsule TAKE 1 CAPSULE (0.4 MG TOTAL) BY MOUTH DAILY. 90 capsule 1  ? TRUEplus Lancets 33G MISC Use to check blood sugar once a day.  DX  E11.9 100 each 1  ? ?No current facility-administered medications for this visit.  ? ? ?REVIEW OF SYSTEMS:  ? ?_0  denotes positive finding, _1  denotes negative finding ?Cardiac  Comments:  ?Chest pain or chest pressure:    ?Shortness of breath upon exertion:    ?Short of breath when lying flat:    ?Irregular heart rhythm:     ?    ?Vascular    ?Pain in calf, thigh, or hip brought on by ambulation:    ?Pain in feet at night that wakes you up from your sleep:     ?Blood clot in your veins:    ?Leg swelling:     ?    ?  Pulmonary    ?Oxygen at home:    ?Productive cough:     ?Wheezing:     ?    ?Neurologic    ?Sudden weakness in arms or legs:     ?Sudden numbness in arms or legs:     ?Sudden onset of difficulty speaking or slurred speech:    ?Temporary loss of vision in one eye:     ?Problems with dizziness:     ?    ?Gastrointestinal    ?Blood in stool:     ? ?Vomited blood:     ?    ?Genitourinary    ?Burning when urinating:     ?Blood in urine:    ?    ?Psychiatric    ?Major depression:     ?    ?Hematologic    ?Bleeding problems:    ?Problems with blood clotting too easily:    ?    ?Skin    ?Rashes or ulcers:    ?    ?Constitutional    ?Fever or chills:    ? ?PHYSICAL EXAM:  ? ?Vitals:  ? 10/05/21 1103 10/05/21 1107  ?BP: (!) 171/77 (!) 175/65  ?Pulse: 60   ?Resp: 20   ?Temp: 98.1 ?F (36.7 ?C)   ?SpO2: 96%   ?Weight: 125 lb (56.7 kg)   ?Height: _0  (1.626 m)   ? ? ?GENERAL: The patient is a well-nourished male, in no acute distress. The vital signs are documented above. ?CARDIAC: There is a regular rate and rhythm.  ?VASCULAR: Palpable posterior tibial pulses ?PULMONARY: Nonlabored respirations ?ABDOMEN: Soft and non-tender.  No pulsatile mass  ?MUSCULOSKELETAL: There are no major deformities or cyanosis. ?NEUROLOGIC: No focal weakness or paresthesias are detected. ?SKIN: There are no ulcers or rashes noted. ?PSYCHIATRIC: The patient has a normal affect. ? ?STUDIES:  ? ?I have reviewed the following carotid duplex: ?1. Mild (1-49%) stenosis proximal right internal carotid artery ?secondary to moderate smooth heterogeneous atherosclerotic plaque. ?2. Mild (1-49%) stenosis proximal left internal carotid artery ?secondary to mild smooth heterogeneous atherosclerotic plaque. ?3. Vertebral arteries are patent with normal antegrade  flow. ? ?ASSESSMENT and PLAN  ? ?Carotid: The patient has mild bilateral stenosis which is asymptomatic.  He should continue his current medical regimen.  He will follow-up with a repeat ultrasound of his carotid

## 2021-10-14 ENCOUNTER — Ambulatory Visit (INDEPENDENT_AMBULATORY_CARE_PROVIDER_SITE_OTHER): Payer: Medicare HMO | Admitting: Pharmacist

## 2021-10-14 VITALS — BP 160/70 | HR 71

## 2021-10-14 DIAGNOSIS — I2510 Atherosclerotic heart disease of native coronary artery without angina pectoris: Secondary | ICD-10-CM

## 2021-10-14 DIAGNOSIS — E1165 Type 2 diabetes mellitus with hyperglycemia: Secondary | ICD-10-CM

## 2021-10-14 DIAGNOSIS — I10 Essential (primary) hypertension: Secondary | ICD-10-CM

## 2021-10-14 DIAGNOSIS — E785 Hyperlipidemia, unspecified: Secondary | ICD-10-CM

## 2021-10-14 NOTE — Patient Instructions (Signed)
[name] ?It was a pleasure speaking with you  ?Below is a summary of your health goals and care plan ? ?If you have any questions or concerns, please feel free to contact me either at the phone number below or with a MyChart message.  ? ?Keep up the good work! ? ?Cherre Robins, PharmD ?Clinical Pharmacist ?[facility] ?[place] ?[phone] (direct line)  ?[phone] (main office number) ? ? ?Chronic Care Management Care Plan ?Hypertension ?BP Readings from Last 3 Encounters:  ?10/14/21 (!) 160/70  ?10/05/21 (!) 175/65  ?08/18/21 126/72  ? ?Pharmacist Clinical Goal(s): ?Over the next 90 days, patient will work with PharmD and providers to achieve BP goal <130/80 ?Current regimen:  ?Amlodipine 2.'5mg'$  daily  ?Losartan/hydrochlorothiazide  100-25 mg daily  ?Metoprolol ER '50mg'$  daily  ?Interventions: ?Requested patient to check his blood pressure 2 to 3 times per week and record ?Discussed blood pressure goal and importance of monitoring blood pressure  ?Sent in updated prescription for metoprolol ER ?Patient self care activities - Over the next 90 days, patient will: ?Check blood pressure 2 to 3 times per week, document, and provide at future appointments ?Ensure daily salt intake < 2300 mg/day ?Continue current blood pressure medications ?Restart metoprolol ER 50 mg daily ? ? ?Hyperlipidemia ?Lab Results  ?Component Value Date/Time  ? [place] 89 08/03/2021 08:53 AM  ? ?Pharmacist Clinical Goal(s): ?Over the next 90 days, patient will work with PharmD and providers to maintain LDL goal < 70 ?Current regimen:  ?Atorvastatin '20mg'$  daily ?Interventions: ?Discussed importance of medication adherence  ?Patient self care activities - Over the next 90 days, patient will: ?Maintain cholesterol medication regimen.  ? ?Diabetes ?Lab Results  ?Component Value Date/Time  ? HGBA1C 9.9 (H) 08/03/2021 08:53 AM  ? HGBA1C 7.1 (A) 02/17/2021 08:29 AM  ? HGBA1C 7.2 (H) 07/31/2020 09:58 AM  ? ?Pharmacist Clinical  Goal(s): ?Over the next 90 days, patient will work with PharmD and providers to maintain A1c goal <7% ?Current regimen:  ?Glimepiride '2mg'$  daily ?Jardiance '10mg'$  daily  ?Interventions: ?Requested patient to check blood sugar daily and record ?Reviewed home blood glucose readings and reviewed goals  ?Fasting blood glucose goal (before meals) = 80 to 130 ?Blood glucose goal after a meal = less than 180  ?Patient self care activities - Over the next 90 days, patient will: ?Check blood sugar once daily, document, and provide at future appointments ?Contact provider with any episodes of hypoglycemia (blood glucose less than 80) ?Great job with exercise - continue current activity level.  ?Continue current medications to lower blood glucose ? ?Osteoporosis: ?Pharmacist Clinical Goal(s) ?Over the next 90 days, patient will work with PharmD and providers to reduce risk of fracture due to osteopenia/osteoporosis ?Current regimen:  ?Alendronate '70mg'$  - take 1 tablet once a week - take on an empty stomach with a full glass of water in the morning at least 30 minutes before eating or taking other medications or drinking anything other than water.  ?Interventions: ?Consider daily intake of '1200mg'$  of calcium daily through diet or supplementation  ?Patient self care activities - Over the next 90 days, patient will: ?Intake of '1200mg'$  of calcium daily through diet and/or supplementation ?Continue alendronate ? ?Medication management ?Pharmacist Clinical Goal(s): ?Over the next 90 days, patient will work with PharmD and providers to achieve optimal medication adherence ?Current pharmacy: [facility] ?Interventions ?Comprehensive medication review performed. ?Continue current medication management strategy ?Patient self care activities - Over the next 90 days, patient will: ?  Focus on medication adherence by filling and taking medications appropriately  ?Take medications as prescribed ?Report any questions or concerns to PharmD and/or  provider(s) ? ? ?Patient Goals/Self-Care Activities ?Over the next 90 days, patient will:  ?take medications as prescribed  ?check glucose daily, document, and provide at future appointments ?check blood pressure 2 to 3 times per week, document, and provide at future appointments ?target a minimum of 150 minutes of moderate intensity exercise weekly ?Consider getting medication container with alarm to remind you to take medications at appropriate time.  ?Print copy of patient instructions, educational materials, and care plan provided in person.   ?

## 2021-10-18 MED ORDER — LOSARTAN POTASSIUM-HCTZ 100-25 MG PO TABS: 1.0000 | ORAL_TABLET | Freq: Every day | ORAL | 1 refills | Status: DC

## 2021-10-18 MED ORDER — TRUE METRIX BLOOD GLUCOSE TEST VI STRP: ORAL_STRIP | 3 refills | Status: DC

## 2021-10-18 MED ORDER — EMPAGLIFLOZIN 10 MG PO TABS: 10.0000 mg | ORAL_TABLET | Freq: Every day | ORAL | 1 refills | Status: DC

## 2021-10-18 MED ORDER — ATORVASTATIN CALCIUM 20 MG PO TABS: 20.0000 mg | ORAL_TABLET | Freq: Every day | ORAL | 1 refills | Status: DC

## 2021-10-18 NOTE — Chronic Care Management (AMB) (Signed)
Chronic Care Management Pharmacy Note  10/14/2021 Name:  [name] MRN:  [id number] DOB:  1932/05/26  Summary:  Patient did not bring in medications but we did review medication list. Discussed reason for taking each medication. Also discuss adherence as there are several medications that have not been filled on time. Has not been checking blood glucose at home. Recommended start checking blood glucose daily.  Blood pressure was elevated today but suspect patient might be missing doses of medication. Reviewed importance of taking medications every day. Provided updated medication list and encouraged him to to use medications box.  Patient later called back and stated he needed updated prescriptions for test strips, Jardiance, lisinopril hydrochlorothiazide and atorvastatin.   Subjective: [name] is an 88 y.o. year old male who is a primary patient of [name], DO.  The CCM team was consulted for assistance with disease management and care coordination needs.    Engaged with patient face to face for follow up visit in response to provider referral for pharmacy case management and/or care coordination services.   Consent to Services:  The patient was given information about Chronic Care Management services, agreed to services, and gave verbal consent prior to initiation of services.  Please see initial visit note for detailed documentation.   Patient Care Team: Carollee Herter, Alferd Apa, DO as PCP - General (Family Medicine) Kathie Rhodes, MD (Inactive) as Consulting Physician (Urology) Tyler Pita, MD as Consulting Physician (Radiation Oncology) Marilynne Halsted, MD as Referring Physician (Ophthalmology) Orson Slick, MD as Consulting Physician (Hematology and Oncology) [facility], Melanie Crazier, MD as Consulting Physician (Endocrinology) Robley Fries, MD as Consulting Physician (Urology) Madelon Lips, MD as Consulting Physician  (Nephrology) Cherre Robins, RPH-CPP (Pharmacist)  Recent office visits: 08/03/2021 - Fam Med (Dr Etter SjogrenCheri Rous) Preventative health care / ED follow up. BP today was 120/80 and HR 57. Labs checked, Vaccines discussed. Ordered carotid duplex.     Recent consult visits: 10/05/2021 - Vascular Surgery (Dr Trula Slade) Consult for Carotid disease. mild bilateral stenosis which is asymptomatic.  He should continue his current medical regimen.  He will follow-up with a repeat ultrasound of his carotid arteries in 2 years 09/07/2021 - Ophthalmology (Dr Edilia Bo - [facility]) - Seen for glaucoma both eyes. Continue Combigan twice a day in both eyes.  08/18/2021 - Endo (Dr Kelton Pillar) Uncontrolled type 2 DM. Stopped Janumet, started Jardiance 57m daily. Increased glimepiride to 235mdaily before breakfast. F/U 3 months. 05/04/2021 - Rheum (Dr RiBenjamine MolaF/U osteoporosis. No med changes. 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 Labs checked. No med changes.   Hospital visits: 07/27/2021 - ED Visit for hypertensive emergency. BP in ED was 156/59 and HR 70. EKG checked - wa sin sinus rhythm with occasional premature ventricular complexes and Premature atrial complexes. Given hydralazine and labetalol IV in ED. No medication changes noted at discharge.   Objective:  Lab Results  Component Value Date   CREATININE 1.51 (H) 08/03/2021   CREATININE 1.29 (H) 07/27/2021   CREATININE 1.35 (H) 04/27/2021    Lab Results  Component Value Date   HGBA1C 9.9 (H) 08/03/2021   Last diabetic Eye exam:  Lab Results  Component Value Date/Time   HMDIABEYEEXA No Retinopathy 08/28/2015 12:00 AM    Last diabetic Foot exam:  Lab Results  Component Value Date/Time   HMDIABFOOTEX done 10/23/2013 12:00 AM  Component Value Date/Time   CHOL 147 08/03/2021 0853   TRIG 76.0 08/03/2021 0853   HDL 42.80 08/03/2021 0853   CHOLHDL 3 08/03/2021 0853   VLDL  15.2 08/03/2021 0853   LDLCALC 89 08/03/2021 0853       Latest Ref Rng & Units 08/03/2021    8:53 AM 04/27/2021    2:33 PM 12/04/2020    9:10 AM  Hepatic Function  Total Protein 6.0 - 8.3 g/dL 6.5   6.7   6.4    Albumin 3.5 - 5.2 g/dL 4.1   3.6   4.0    AST 0 - 37 U/L _0 ALT 0 - 53 U/L _1 Alk Phosphatase 39 - 117 U/L 68   88   73    Total Bilirubin 0.2 - 1.2 mg/dL 0.7   0.4   0.7      Lab Results  Component Value Date/Time   TSH 2.47 10/20/2009 08:42 AM       Latest Ref Rng & Units 08/03/2021    8:53 AM 07/27/2021    3:10 PM 04/27/2021    2:33 PM  CBC  WBC 4.0 - 10.5 K/uL 7.9   10.6   10.1    Hemoglobin 13.0 - 17.0 g/dL 12.7   14.0   12.8    Hematocrit 39.0 - 52.0 % 38.9   41.8   38.3    Platelets 150.0 - 400.0 K/uL 213.0   227   231      Lab Results  Component Value Date/Time   VD25OH 80 01/28/2021 08:40 AM    Clinical ASCVD: Yes  The ASCVD Risk score (Arnett DK, et al., 2019) failed to calculate for the following reasons:   The 2019 ASCVD risk score is only valid for ages 79 to 62     Social History   Tobacco Use  Smoking Status Former   Packs/day: 0.25   Years: 3.00   Pack years: 0.75   Types: Cigarettes   Quit date: 06/07/1958   Years since quitting: 63.4  Smokeless Tobacco Never   BP Readings from Last 3 Encounters:  10/14/21 (!) 160/70  10/05/21 (!) 175/65  08/18/21 126/72   Pulse Readings from Last 3 Encounters:  10/14/21 71  10/05/21 60  08/18/21 76   Wt Readings from Last 3 Encounters:  10/05/21 125 lb (56.7 kg)  08/18/21 130 lb (59 kg)  08/03/21 128 lb (58.1 kg)    Assessment: Review of patient past medical history, allergies, medications, health status, including review of consultants reports, laboratory and other test data, was performed as part of comprehensive evaluation and provision of chronic care management services.   SDOH:  (Social Determinants of Health) assessments and interventions performed:       CCM Care Plan  No Known Allergies  Medications Reviewed Today     Reviewed by Cherre Robins, RPH-CPP (Pharmacist) on 10/14/21 at 1338  Med List Status: <None>   Medication Order Taking? Sig Documenting Provider Last Dose Status Informant  Alcohol Swabs PADS [id number] Yes Use as directed once a day Carollee Herter, Alferd Apa, DO Taking Active Self  alendronate (FOSAMAX) 70 MG tablet [id number] Yes TAKE 1 TABLET (70 MG TOTAL) ONCE A WEEK. TAKE WITH A FULL GLASS OF WATER ON AN EMPTY STOMACH. Collier Salina, MD Taking Active Self  amLODipine (NORVASC) 2.5 MG tablet [id number] Yes Take 1 tablet (2.5 mg  total) by mouth daily. [name], DO Taking Active Self  aspirin 81 MG tablet [id number] Yes Take 81 mg by mouth daily. [provider] Taking Active Self  atorvastatin (LIPITOR) 20 MG tablet [id number] Yes Take 1 tablet (20 mg total) by mouth daily. Roma Schanz R, DO Taking Active Self  Blood Glucose Calibration (TRUE METRIX LEVEL 1) Low SOLN [id number] Yes USE AS DIRECTED [name], Melanie Crazier, MD Taking Active Self  Blood Glucose Calibration (TRUE METRIX LEVEL 3) High SOLN [id number] Yes Use to check controls on glucometer strips every 30 days or with each new bottle of strips (whichever comes first). [name], DO Taking Active Self  Blood Glucose Monitoring Suppl (TRUE METRIX METER) w/Device Drucie Opitz [id number] Yes USE AS DIRECTED ONCE A DAY [name], Melanie Crazier, MD Taking Active Self  brimonidine-timolol (COMBIGAN) 0.2-0.5 % ophthalmic solution [id number] Yes Place 1 drop into both eyes 2 (two) times daily. [provider] Taking Active Self  COLLAGEN PO [id number] Yes Take by mouth. 1 scoop daily [provider] Taking Active Self  empagliflozin (JARDIANCE) 10 MG TABS tablet [id number] Yes Take 1 tablet (10 mg total) by mouth daily before breakfast. [name], Melanie Crazier, MD Taking Active Self  fish oil-omega-3 fatty acids  1000 MG capsule [id number] Yes Take 2 g by mouth daily. [provider] Taking Active Self  glimepiride (AMARYL) 2 MG tablet [id number] Yes Take 1 tablet (2 mg total) by mouth daily before breakfast. [name], Melanie Crazier, MD Taking Active Self  glucose blood (TRUE METRIX BLOOD GLUCOSE TEST) test strip [id number] Yes USE AS DIRECTED ONCE A DAY Carollee Herter, Alferd Apa, DO Taking Active Self  losartan-hydrochlorothiazide (HYZAAR) 100-25 MG tablet [id number] Yes Take 1 tablet by mouth daily. [name], DO Taking Active Self  metoprolol succinate (TOPROL-XL) 50 MG 24 hr tablet [id number] Yes Take 1 tablet (50 mg total) by mouth daily. TAKE WITH OR IMMEDIATELY FOLLOWING A MEAL. [name], DO Taking Active Self  Multiple Vitamin (MULTIVITAMIN) tablet [id number] Yes Take 1 tablet by mouth daily. [provider] Taking Active Self  tamsulosin (FLOMAX) 0.4 MG CAPS capsule [id number] Yes TAKE 1 CAPSULE (0.4 MG TOTAL) BY MOUTH DAILY. [name], DO Taking Active Self  TRUEplus Lancets 33G [place] [id number] Yes Use to check blood sugar once a day.  DX  E11.9 [name], DO Taking Active Self            Patient Active Problem List   Diagnosis Date Noted   Vitamin D deficiency 01/28/2021   Diabetes mellitus ([place]) 04/15/2020   Type 2 diabetes mellitus with stage 3b chronic kidney disease, without long-term current use of insulin ([place]) 02/06/2020   Type 2 diabetes mellitus with diabetic polyneuropathy, without long-term current use of insulin ([place]) 02/06/2020   Uncontrolled type 2 diabetes mellitus with hyperglycemia ([place]) 04/06/2019   Hyperlipidemia associated with type 2 diabetes mellitus ([place]) 04/06/2019   Monoclonal gammopathy 04/06/2019   Osteoporosis with pathological fracture of forearm 03/21/2019   Multiple closed fractures of ribs of left side 03/21/2019   Primary open angle glaucoma (POAG) of left eye, mild stage 02/04/2019   Primary open  angle glaucoma (POAG) of right eye, moderate stage 02/04/2019   Preventative health care 04/01/2017   Prostate cancer ([place]) 02/15/2017   Fuchs' corneal dystrophy 08/18/2016   BPH with urinary obstruction 12/13/2012   CAD (coronary artery disease) 04/09/2011   Gastritis and gastroduodenitis 05/25/2010   COLONIC  POLYPS, ADENOMATOUS, HX OF 04/20/2010   ANEMIA 10/27/2009   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, without long-term current use of insulin ([place]) 07/04/2006   Hyperlipidemia 07/04/2006   Essential hypertension 07/04/2006    Immunization History  Administered Date(s) Administered   Fluad Quad(high Dose 65+) 04/06/2019, 04/28/2020, 08/03/2021   Influenza Split 03/05/2011, 04/24/2012   Influenza Whole 02/27/2008, 03/04/2010   Influenza, High Dose Seasonal PF 04/01/2017, 03/14/2018   Influenza,inj,quad, With Preservative 03/07/2013   Influenza-Unspecified 03/08/2015   PFIZER(Purple Top)SARS-COV-2 Vaccination 08/13/2019, 09/18/2019, 03/21/2020   Pneumococcal Conjugate-13 08/26/2014   Pneumococcal Polysaccharide-23 02/27/2008, 07/31/2020    Conditions to be addressed/monitored: CAD, HTN, HLD, DMII, CKD Stage 3b, and BPH, h/o prostate cancer; multiple myeloma; glaucoma  Care Plan : General Pharmacy (Adult)  Updates made by Cherre Robins, RPH-CPP since 10/18/2021 12:00 AM     Problem: HTN, HLD, DM   Priority: High  Onset Date: 08/15/2020     Long-Range Goal: Provide education, support and care coordination for medication therapy and chronic conditions   Start Date: 08/15/2020  Expected End Date: 02/15/2021  Recent Progress: On track  Priority: High  Note:   Current Barriers:  Not taking medications as prescribed. Patient education regarding BG goals and glucometer needed.   Pharmacist Clinical Goal(s):  Over the next 90 days, patient will achieve adherence to monitoring guidelines and medication adherence to achieve therapeutic efficacy maintain  control of blood pressure and blood sugar as evidenced by home monitoring  adhere to prescribed medication regimen as evidenced by fill dates through collaboration with PharmD and provider.   Interventions: 1:1 collaboration with Carollee Herter, Alferd Apa, DO regarding development and update of comprehensive plan of care as evidenced by provider attestation and co-signature Inter-disciplinary care team collaboration (see longitudinal plan of care) Comprehensive medication review performed; medication list updated in electronic medical record  Hypertension (BP goal <130/80) Controlled Current treatment: Amlodipine 2.5mg  daily Losartan-hydrochlorothiazide 100-25 mg daily  Metoprolol succinate ER 50mg  daily  Medications previously tried: hydralazine (d/c post hospital) Current home readings:  per patient usually 120 - 140/ 70 - 80 (however he has had a few readings with SBP 150 to 170.  Current exercise habits: Runs or stationary bike daily; lifts 20lb weight for arm exercise daily and does squats daily; also push mows own lawn and grows 50 organic tomato plants each summer.  Denies hypotensive/hypertensive symptoms; denies edema Interventions:  Educated on BP goals and benefits of medications for prevention of heart attack, stroke and kidney damage; Recommended continue to check blood pressure 2 to 3 times per week, document, and provide log at future appointments Discussed using [employer] over-the-counter benefits to purchase new blood pressure cuff. Provided [employer] over-the-counter catalog.   Hyperlipidemia / CAD: (LDL goal < 70) Controlled Current treatment: Aspirin 81 mg daily Atorvastatin 20 mg daily Fish Oil 2g daily Medications previously tried: simvastatin (amlodipine interaction) Current exercise habits: see above Interventions:  Reviewed cholesterol goals;  Benefits of statin for ASCVD risk reduction;  Recommended to continue current medication  Diabetes (A1c goal <7%) Managed  by endo - Dr Kelton Pillar Last A1c 7.1%  Current medications: Glimepiride 2mg  daily  Jardiance 10mg  daily  Medications previously tried: glimepiride 4mg  daily (hypoglycemia); Janumet (changed by endo Current home glucose readings: hasn't checked recently Denies hypoglycemic/hyperglycemic symptoms; Denies blood glucose less than 80 Noted to have macrovascular and neuropathic complications  Current exercise: see above Current diet: breakfast is usually avocado, 1 egg and 1 slice of toast; Lunch - sandwich  or vegetables; Dinner - eats out with his son most nights but usually salad, vegetable and either chicken, lamb or fish.  Interventions:  Reviewed home blood glucose readings and reviewed goals  Fasting blood glucose goal (before meals) = 80 to 130 Blood glucose goal after a meal = less than 180  Reviewed how to use control solution to check to see that glucometer is accurate Restart checking blood glucose daily Recommended to continue current medication - sent in updated prescription for [employer] job with exercise - continue current activity level.   CKD - stage 3B: Goal: avoid nephrotoxic medications and slow CKD progression Managed by [facility] - Dr Hollie Salk (last visit 05/2020) Lab Results  Component Value Date   CREATININE 1.51 (H) 08/03/2021   CREATININE 1.29 (H) 07/27/2021   CREATININE 1.35 (H) 04/27/2021  Interventions:  Reviewed med list for meds that might need adjustment based on Scr / eGFR No change in metformin dose currently but will continue to monitor.  Patient endorsed that he does not take any NSAIDs   Osteoporosis: Goal: Reduce risk of fracture due to osteopenia/osteoporosis Managed by rheumatologist left wrist fracture 2020.  Dexa done 2022: Lowest T-Score = -2.8 at left femoral neck.  Last PTH and vitamin D WNL. Current regimen:  Alendronate 29m - take 1 tablet once a week - take on an empty stomach with a full glass of water in the morning at  least 30 minutes before eating or taking other medications or drinking anything other than water.  Interventions: Consider daily intake of 12019mof calcium daily through diet or supplementation  Intake of 12006mf calcium daily through diet and/or supplementation Continue alendronate  Medication management Pharmacist Clinical Goal(s): Over the next 90 days, patient will work with PharmD and providers to maintain optimal medication adherence I am concerned that patient has not filled medications on time. He reviewed each of his medications over the phone and endorsed that he has all of them on hand.  [facility] Comprehensive medication review performed. Reviewed refill history and assessed adherence Consider getting medication container with alarm to remind you to take medications at appropriate time. Will continue to follow medication adherence   Patient Goals/Self-Care Activities Over the next 90 days, patient will:  take medications as prescribed  check glucose daily, document, and provide at future appointments check blood pressure 2 to 3 times per week, document, and provide at future appointments target a minimum of 150 minutes of moderate intensity exercise weekly Consider getting medication container with alarm to remind you to take medications at appropriate time.  Follow Up Plan: The care management team will reach out to the patient again over the next 30        Medication Assistance: None required.  Patient affirms current coverage meets needs.  Patient's [facility] pharmacy [facility]:  [id number]: [phone]: [phone] [place] -[place]: [phone]: [phone]: [phone]: [phone] Up:  Patient agrees to Care Plan and Follow-up.  Plan: Face to Face appointment with care management team member scheduled for: 11/18/2021 with Clinical Pharmacist Practitioner   TammCherre RobinsarmD Clinical Pharmacist [place] [phone]

## 2021-10-26 ENCOUNTER — Inpatient Hospital Stay: Payer: Medicare HMO | Admitting: Hematology and Oncology

## 2021-10-26 ENCOUNTER — Inpatient Hospital Stay: Payer: Medicare HMO | Attending: Hematology and Oncology

## 2021-10-26 ENCOUNTER — Telehealth: Payer: Self-pay

## 2021-10-26 ENCOUNTER — Other Ambulatory Visit: Payer: Self-pay | Admitting: Hematology and Oncology

## 2021-10-26 DIAGNOSIS — C61 Malignant neoplasm of prostate: Secondary | ICD-10-CM | POA: Insufficient documentation

## 2021-10-26 DIAGNOSIS — Z79899 Other long term (current) drug therapy: Secondary | ICD-10-CM | POA: Insufficient documentation

## 2021-10-26 DIAGNOSIS — D472 Monoclonal gammopathy: Secondary | ICD-10-CM | POA: Insufficient documentation

## 2021-10-26 NOTE — Telephone Encounter (Signed)
TCT patient regarding missed appointment. Patient understands that our scheduling department will call him to reschedule.  Scheduling message sent.

## 2021-10-27 ENCOUNTER — Telehealth: Payer: Self-pay | Admitting: Hematology and Oncology

## 2021-10-27 NOTE — Telephone Encounter (Signed)
.  Called patient to schedule appointment per 5/22 inb, patient is aware of date and time.

## 2021-10-28 ENCOUNTER — Other Ambulatory Visit: Payer: Self-pay

## 2021-10-28 ENCOUNTER — Inpatient Hospital Stay: Payer: Medicare HMO

## 2021-10-28 ENCOUNTER — Inpatient Hospital Stay: Payer: Medicare HMO | Admitting: Hematology and Oncology

## 2021-10-28 VITALS — BP 172/68 | HR 57 | Temp 97.7°F | Resp 18 | Ht 64.0 in | Wt 126.9 lb

## 2021-10-28 DIAGNOSIS — C61 Malignant neoplasm of prostate: Secondary | ICD-10-CM | POA: Diagnosis not present

## 2021-10-28 DIAGNOSIS — D472 Monoclonal gammopathy: Secondary | ICD-10-CM

## 2021-10-28 DIAGNOSIS — Z79899 Other long term (current) drug therapy: Secondary | ICD-10-CM | POA: Diagnosis not present

## 2021-10-28 LAB — CBC WITH DIFFERENTIAL (CANCER CENTER ONLY)
Abs Immature Granulocytes: 0.02 10*3/uL (ref 0.00–0.07)
Basophils Absolute: 0.1 10*3/uL (ref 0.0–0.1)
Basophils Relative: 1 %
Eosinophils Absolute: 0.1 10*3/uL (ref 0.0–0.5)
Eosinophils Relative: 1 %
HCT: 41.2 % (ref 39.0–52.0)
Hemoglobin: 13.4 g/dL (ref 13.0–17.0)
Immature Granulocytes: 0 %
Lymphocytes Relative: 32 %
Lymphs Abs: 3.3 10*3/uL (ref 0.7–4.0)
MCH: 29.7 pg (ref 26.0–34.0)
MCHC: 32.5 g/dL (ref 30.0–36.0)
MCV: 91.4 fL (ref 80.0–100.0)
Monocytes Absolute: 0.9 10*3/uL (ref 0.1–1.0)
Monocytes Relative: 9 %
Neutro Abs: 6 10*3/uL (ref 1.7–7.7)
Neutrophils Relative %: 57 %
Platelet Count: 216 10*3/uL (ref 150–400)
RBC: 4.51 MIL/uL (ref 4.22–5.81)
RDW: 13.5 % (ref 11.5–15.5)
WBC Count: 10.4 10*3/uL (ref 4.0–10.5)
nRBC: 0 % (ref 0.0–0.2)

## 2021-10-28 LAB — CMP (CANCER CENTER ONLY)
ALT: 12 U/L (ref 0–44)
AST: 13 U/L — ABNORMAL LOW (ref 15–41)
Albumin: 4.2 g/dL (ref 3.5–5.0)
Alkaline Phosphatase: 100 U/L (ref 38–126)
Anion gap: 6 (ref 5–15)
BUN: 28 mg/dL — ABNORMAL HIGH (ref 8–23)
CO2: 29 mmol/L (ref 22–32)
Calcium: 9.2 mg/dL (ref 8.9–10.3)
Chloride: 104 mmol/L (ref 98–111)
Creatinine: 1.52 mg/dL — ABNORMAL HIGH (ref 0.61–1.24)
GFR, Estimated: 44 mL/min — ABNORMAL LOW (ref >60)
Glucose, Bld: 216 mg/dL — ABNORMAL HIGH (ref 70–99)
Potassium: 4.2 mmol/L (ref 3.5–5.1)
Sodium: 139 mmol/L (ref 135–145)
Total Bilirubin: 0.6 mg/dL (ref 0.3–1.2)
Total Protein: 7.2 g/dL (ref 6.5–8.1)

## 2021-10-28 LAB — LACTATE DEHYDROGENASE: LDH: 153 U/L (ref 98–192)

## 2021-10-28 NOTE — Progress Notes (Signed)
[name] [phone]   [phone]  PROGRESS NOTE  Patient Care Team: Carollee Herter, Alferd Apa, DO as PCP - General (Family Medicine) Kathie Rhodes, MD (Inactive) as Consulting Physician (Urology) Tyler Pita, MD as Consulting Physician (Radiation Oncology) Marilynne Halsted, MD as Referring Physician (Ophthalmology) Orson Slick, MD as Consulting Physician (Hematology and Oncology) [facility], Melanie Crazier, MD as Consulting Physician (Endocrinology) Robley Fries, MD as Consulting Physician (Urology) Madelon Lips, MD as Consulting Physician (Nephrology) Cherre Robins, RPH-CPP (Pharmacist)  Hematological/Oncological History #Smoldering Multiple Myeloma, Intermediate Risk 1) 02/15/19: found to have ambda chains 645.5, kappa 27.9 with a ratio of 0.04, no serum M protein during nephrology work up.  2) 04/06/19: establish care with Dr. Lorenso Courier 3) 04/06/19: SPEP shows no serum monoclonal protein, however high levels of M protein detected in the urine. B2Microglobulin at 3.3. Bone Survey negative.  4)04/23/19: Bone Marrow biopsy performed, confirmed 10-15% clonal plasma cells (9% in aspirate). Confirmed diagnosis of Smoldering multiple myeloma, intermediate risk. WBC 8.6, Hgb 12.4 5) 10/30/2019: WBC 9.2, Hgb 14.6, Cr 1.59, Kappa 29.4, Lambda 597.1, ratio 0.05.    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 Gleason Score of 3+4 1) 05/06/17: Insertion of radioactive I-125 seeds into the prostate gland with placement of SpaceOAR; 145 Gy, definitive therapy. Radiation oncologist was Dr. Tammi Klippel at [facility] 2) Subsequently followed by outside urology with serial PSA measurements. Reported last measurement in Oct 2020 at PSA 0.2.   Interval History:  [name] 88 y.o. male with medical history significant for smoldering multiple myeloma and prostate cancer presents for a follow up visit. He was last seen on 04/27/2021 for a routine f/u. In the  interim since his last visit [name] has had no changes in medications, no hospitalizations or emergency room visits.  On exam today [name] reports he has been doing quite well in the interim since her last visit.  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  He reports his energy levels are good and he is doing the gym and treadmill daily.  He also enjoys using a stationary bike.  He reports he is growing organic tomatoes and has 55 plants currently growing.  He notes his appetite is good and his weight is steady.  He has been doing his best to eat well with avocados, buckwheat cereal, and almond milk with no sugar.  He notes that he is in excellent health and is "happy every day".  On further discussion he denies any bleeding, bruising, dark stools, shortness of breath, fatigue, lightheadedness, dizziness or new bone or back pain.  A full 10 point ROS is listed below.  MEDICAL HISTORY:  Past Medical History:  Diagnosis Date   CAD (coronary artery disease)    LHC 03/25/11 by Dr. Burt Knack:  pLAD 99%, oCFX 20-30%, pOM1 40%, dAVCFX 20-30%.  EF was normal on nuclear study.  He was treated with a Promus DES to his pLAD.    Colon polyps 1996   villous adenoma   DM type 2 (diabetes mellitus, type 2) ([place]) 2002   Fuchs' corneal dystrophy    Hyperlipidemia    Hypertension    Macular degeneration    posterior vitreous vitreous detachment   Prostate cancer ([place]) dx 2018    SURGICAL HISTORY: Past Surgical History:  Procedure Laterality Date   CARDIAC CATHETERIZATION  03/25/2011   cataract Bilateral 2010   corenea implants  june and sept 2018  [name]   CORONARY STENT PLACEMENT  03/25/2011   RADIOACTIVE SEED IMPLANT N/A 05/06/2017   Procedure: RADIOACTIVE SEED IMPLANT/BRACHYTHERAPY IMPLANT;  Surgeon: Kathie Rhodes, MD;  Location: [facility];  Service: Urology;  Laterality: N/A;   SPACE OAR INSTILLATION N/A 05/06/2017    Procedure: SPACE OAR INSTILLATION;  Surgeon: Kathie Rhodes, MD;  Location: [facility];  Service: Urology;  Laterality: N/A;    SOCIAL HISTORY: Social History   Socioeconomic History   Marital status: Widowed    Spouse name: Not on file   Number of children: Not on file   Years of education: Not on file   Highest education level: Not on file  Occupational History   Occupation: retired  Tobacco Use   Smoking status: Former    Packs/day: 0.25    Years: 3.00    Pack years: 0.75    Types: Cigarettes    Quit date: 06/07/1958    Years since quitting: 63.4   Smokeless tobacco: Never  Vaping Use   Vaping Use: Never used  Substance and Sexual Activity   Alcohol use: Not Currently   Drug use: No   Sexual activity: Not Currently  Other Topics Concern   Not on file  Social History Narrative   Not on file   Social Determinants of Health   Financial Resource Strain: Low Risk    Difficulty of Paying Living Expenses: Not hard at all  Food Insecurity: No Food Insecurity   Worried About Charity fundraiser in the Last Year: Never true   [place] in the Last Year: Never true  Transportation Needs: No Transportation Needs   Lack of Transportation (Medical): No   Lack of Transportation (Non-Medical): No  Physical Activity: Sufficiently Active   Days of Exercise per Week: 7 days   Minutes of Exercise per Session: 30 min  Stress: No Stress Concern Present   Feeling of Stress : Not at all  Social Connections: Moderately Integrated   Frequency of Communication with Friends and Family: More than three times a week   Frequency of Social Gatherings with Friends and Family: Twice a week   Attends Religious Services: More than 4 times per year   Active Member of [employer] or Organizations: Yes   Attends Archivist Meetings: More than 4 times per year   Marital Status: Widowed  Human resources officer Violence: Not At Risk   Fear of Current or Ex-Partner: No    Emotionally Abused: No   Physically Abused: No   Sexually Abused: No    FAMILY HISTORY: Family History  Problem Relation Age of Onset   Stomach cancer Mother 32   Coronary artery disease Father 51       deceased   Healthy Son    Healthy Son    Healthy Son     ALLERGIES:  has No Known Allergies.  MEDICATIONS:  Current Outpatient Medications  Medication Sig Dispense Refill   Alcohol Swabs PADS Use as directed once a day 100 each 1   alendronate (FOSAMAX) 70 MG tablet TAKE 1 TABLET (70 MG TOTAL) ONCE A WEEK. TAKE WITH A FULL GLASS OF WATER ON AN EMPTY STOMACH. 12 tablet 2   amLODipine (NORVASC) 2.5 MG tablet Take 1 tablet (2.5 mg total) by mouth daily. 90 tablet 1   aspirin 81 MG tablet Take 81 mg by mouth daily.     atorvastatin (LIPITOR) 20 MG tablet Take 1 tablet (20 mg total) by mouth  daily. For cholesterol 90 tablet 1   Blood Glucose Calibration (TRUE METRIX LEVEL 1) Low SOLN USE AS DIRECTED 1 each 3   Blood Glucose Calibration (TRUE METRIX LEVEL 3) High SOLN Use to check controls on glucometer strips every 30 days or with each new bottle of strips (whichever comes first). 3 each 1   Blood Glucose Monitoring Suppl (TRUE METRIX METER) w/Device KIT USE AS DIRECTED ONCE A DAY 1 kit 0   brimonidine-timolol (COMBIGAN) 0.2-0.5 % ophthalmic solution Place 1 drop into both eyes 2 (two) times daily.     COLLAGEN PO Take by mouth. 1 scoop daily     empagliflozin (JARDIANCE) 10 MG TABS tablet Take 1 tablet (10 mg total) by mouth daily before breakfast. 90 tablet 1   fish oil-omega-3 fatty acids 1000 MG capsule Take 2 g by mouth daily.     glimepiride (AMARYL) 2 MG tablet Take 1 tablet (2 mg total) by mouth daily before breakfast. 30 tablet 3   glucose blood (TRUE METRIX BLOOD GLUCOSE TEST) test strip Use to check blood glucose once a day (type 2 DM - E 11.65) 100 strip 3   losartan-hydrochlorothiazide (HYZAAR) 100-25 MG tablet Take 1 tablet by mouth daily. For Blood pressure and kidney  protection 90 tablet 1   metoprolol succinate (TOPROL-XL) 50 MG 24 hr tablet Take 1 tablet (50 mg total) by mouth daily. TAKE WITH OR IMMEDIATELY FOLLOWING A MEAL. 90 tablet 1   Multiple Vitamin (MULTIVITAMIN) tablet Take 1 tablet by mouth daily.     tamsulosin (FLOMAX) 0.4 MG CAPS capsule TAKE 1 CAPSULE (0.4 MG TOTAL) BY MOUTH DAILY. 90 capsule 1   TRUEplus Lancets 33G MISC Use to check blood sugar once a day.  DX  E11.9 100 each 1   No current facility-administered medications for this visit.    REVIEW OF SYSTEMS:   Constitutional: ( - ) fevers, ( - )  chills , ( - ) night sweats Eyes: ( - ) blurriness of vision, ( - ) double vision, ( - ) watery eyes Ears, nose, mouth, throat, and face: ( - ) mucositis, ( - ) sore throat Respiratory: ( - ) cough, ( - ) dyspnea, ( - ) wheezes Cardiovascular: ( - ) palpitation, ( - ) chest discomfort, ( - ) lower extremity swelling Gastrointestinal:  ( - ) nausea, ( - ) heartburn, ( - ) change in bowel habits Skin: ( - ) abnormal skin rashes Lymphatics: ( - ) new lymphadenopathy, ( - ) easy bruising Neurological: ( - ) numbness, ( - ) tingling, ( - ) new weaknesses Behavioral/Psych: ( - ) mood change, ( - ) new changes  All other systems were reviewed with the patient and are negative.  PHYSICAL EXAMINATION: ECOG PERFORMANCE STATUS: 0 - Asymptomatic  Vitals:   10/28/21 1157 10/28/21 1215  BP: (!) 200/77 (!) 172/68  Pulse: (!) 57   Resp: 18   Temp: 97.7 F (36.5 C)   SpO2: 98%    Filed Weights   10/28/21 1157  Weight: 126 lb 14.4 oz (57.6 kg)    GENERAL: well appearing elderly Caucasian male, alert, no distress and comfortable SKIN: skin color, texture, turgor are normal, no rashes or significant lesions EYES: conjunctiva are pink and non-injected, sclera clear LUNGS: clear to auscultation and percussion with normal breathing effort HEART: regular rate & rhythm and no murmurs and no lower extremity edema Musculoskeletal: no cyanosis of  digits and no clubbing  PSYCH: alert & oriented  x 3, fluent speech NEURO: no focal motor/sensory deficits  LABORATORY DATA:  I have reviewed the data as listed     Latest Ref Rng & Units 10/28/2021   11:14 AM 08/03/2021    8:53 AM 07/27/2021    3:10 PM  CMP  Glucose 70 - 99 mg/dL 216   100   211    BUN 8 - 23 mg/dL _0 Creatinine 0.61 - 1.24 mg/dL 1.52   1.51   1.29    Sodium 135 - 145 mmol/L 139   141   135    Potassium 3.5 - 5.1 mmol/L 4.2   4.1   4.6    Chloride 98 - 111 mmol/L 104   105   100    CO2 22 - 32 mmol/L _1 Calcium 8.9 - 10.3 mg/dL 9.2   9.2   9.2    Total Protein 6.5 - 8.1 g/dL 7.2   6.5     Total Bilirubin 0.3 - 1.2 mg/dL 0.6   0.7     Alkaline Phos 38 - 126 U/L 100   68     AST 15 - 41 U/L 13   17     ALT 0 - 44 U/L 12   13         Latest Ref Rng & Units 10/28/2021   11:14 AM 08/03/2021    8:53 AM 07/27/2021    3:10 PM  CBC  WBC 4.0 - 10.5 K/uL 10.4   7.9   10.6    Hemoglobin 13.0 - 17.0 g/dL 13.4   12.7   14.0    Hematocrit 39.0 - 52.0 % 41.2   38.9   41.8    Platelets 150 - 400 K/uL 216   213.0   227       Surgical Pathology  CASE: [id number]  PATIENT: Jelan Wragg  Bone Marrow Report   Clinical History: MGUS, left posterior iliac crest, (ADC)   DIAGNOSIS:   BONE MARROW, ASPIRATE, CLOT, CORE:  - Plasma cell myeloma, see comment.  - No amyloid deposits.  - Minimal iron stores.   PERIPHERAL BLOOD:  - Normocytic anemia.   COMMENT:   The marrow is mildly hypercellular with increased monotypic plasma cells  (9% aspirate, 10-15% CD138 immunohistochemistry). There are no amyloid  deposits seen with Congo red, although there is a lack of larger vessels  in the core biopsy. The findings are consistent with plasma cell  myeloma.   MICROSCOPIC DESCRIPTION:   PERIPHERAL BLOOD SMEAR: There is a normocytic anemia with scattered  elliptocytes/ovalocytes.  There is no rouleaux formation.  Leukocytes  are present in normal  numbers.  Circulating plasma cells are not  identified.  Platelets are present in normal numbers.   BONE MARROW ASPIRATE: Spicular cellular.  Erythroid precursors: Present in appropriate proportions.  No  significant dysplasia.  Granulocytic precursors: Present in appropriate proportions.  No  significant dysplasia.  No increase in blasts.  Megakaryocytes: Present and largely unremarkable.  Lymphocytes/plasma cells: There is a mild increase in plasma cells (9%  by manual differential counts) with scattered atypical forms (large  forms, binucleation).  Lymphocytes are not increased.   TOUCH PREPARATIONS: Similar to aspirate smears.   CLOT AND BIOPSY: The core biopsies and clot sections are mildly  hypercellular for age (23 to 30%).  CD138 immunohistochemistry reveals  increased plasma cells (10 to 15%) scattered  in small clusters.  By  light chain in situ hybridization plasma cells are lambda restricted.  Myeloid and erythroid elements are present in appropriate proportions.  Megakaryocytes exhibit a spectrum of maturation without tight clusters.  There are few benign appearing lymphoid aggregates. Congo red is  negative for amyloid deposits.   IRON STAIN: Iron stains are performed on a bone marrow aspirate or touch  imprint smear and section of clot. The controls stained appropriately.        Storage Iron: Minimal.       Ring Sideroblasts: Absent.   ADDITIONAL DATA/TESTING: Cytogenetics, including FISH for myeloma, was  ordered and will be reported separately.   CELL COUNT DATA:   Bone Marrow count performed on 500 cells shows:  Blasts:   0%   Myeloid:  47%  Promyelocytes: 0%   Erythroid:     23%  Myelocytes:    5%   Lymphocytes:   17%  Metamyelocytes:     0%   Plasma cells:  9%  Bands:    16%  Neutrophils:   22%  M:E ratio:     2.04  Eosinophils:   4%  Basophils:     0%  Monocytes:     4%   Lab Data: CBC performed on 04/23/2019 shows:  WBC: 8.6 k/uL  Neutrophils:   71%   Hgb: 12.4 g/dL Lymphocytes:   25%  HCT: 38.0 %    Monocytes:     3%  MCV: 92.7 fL   Eosinophils:   0%  RDW: 13.8 %    Basophils:     1%  PLT: 221 k/uL   GROSS DESCRIPTION:   A: Aspirate smear   B: The specimen is received in B-plus fixative and consists of a 21.0 x  12.0 x 5.0 mm aggregate of red-brown clotted blood.  The specimen is  entirely submitted in cassette B.   C: The specimen is received in B-plus fixative and consists of 2 cores  of tan bone, measuring 0.6 and 0.9 cm in length by 0.2 cm in diameter.  The specimen is entirely submitted in cassette C. Craig Staggers 04/25/2019)   Final Diagnosis performed by Vicente Males, MD.   Electronically signed  04/25/2019  Technical and / or Professional components performed at [facility], [place] [address], [place], [place] [address].   Immunohistochemistry Technical component (if applicable) was performed  at [facility]. [address], [place],  [place], [place] [address].   IMMUNOHISTOCHEMISTRY DISCLAIMER (if applicable):  Some of these immunohistochemical stains may have been developed and the  performance characteristics determine by [facility]. Some  may not have been cleared or approved by the U.S. Food and Drug  Administration. The FDA has determined that such clearance or approval  is not necessary. This test is used for clinical purposes. It should not  be regarded as investigational or for research. This laboratory is  certified under the [place]  (CLIA-88) as qualified to perform high complexity clinical laboratory  testing.  The controls stained appropriately.   RADIOGRAPHIC STUDIES: No relevant radiographic studies.   ASSESSMENT & PLAN [name] 88 y.o. male with medical history significant for CAD s/p PCI in 2012, DM type II, Prostate cancer s/p brachytherapy, and HTN who presents for follow up for his smoldering multiple  myeloma.   On exam today [name] continues to do well.  He continues to be physically active and works  out on a routine basis.[name]  He continues to be at his baseline level of health with excellent dietary habits and frequent visits to the gym.  He has had no major changes since we last saw him.  Treatment of smoldering multiple myeloma is a developing area of hematology.  There have been clinical trials showing benefit in the treatment of intermediate to high risk SMM (J Clin Oncol. 2020 Apr 10;38(11):1126-1137).  The current treatment recommendation consists of lenalidomide 25 mg daily for 21 of 28 days.  This therapy is however best suited for younger patients with longer life expectancy is given the long time to progression the average smoldering multiple myeloma patient has.  Review of the literature shows that intermediate risk patients have a time to progression of approximately 5 years (Blood Cancer Journal 8, 59 (2018).  Given [name] advanced age I would recommend close monitoring of his hematological parameters and creatinine/calcium to assure that his disease is not progressing.  I have shared with [name] these statistics and recommendations.   Previously after detailed discussion [name] was in agreement with continued monitoring of his smoldering multiple myeloma.  He acknowledged since he feels well, therefore chemotherapy would likely inhibit his physical activity and his overall wellbeing.  I am in strong agreement that holding on treatment at this time is a reasonable option.  #Smoldering Multiple Myeloma, Intermediate Risk --confirmed diagnosis based on the bone marrow biopsy, with plasma cells of 10-15% and no CRAB criteria. No evidence of amyloidosis on bone marrow stain.  --treatment of Smoldering multiple myeloma is a newer idea and predominately consists of monotherapy lenalidomide. Using the Mayo 2018 20/2/20 guidelines, [name] is a borderline Intermediate Risk SMM.  --given  his advance aged and lack of any CRAB criteria, I would recommend holding on treatment at this time with close clinical monitoring for progression. [name] was in agreement with close continued monitoring.  --will collect SPEP, UPEP, and SFLC on a routine basis, q 6 months.  -- will order metastatic survey yearly (next due Nov 2022) --labs today show Cr 1.52, WBC 10.4, Hgb 13.4, MCV 91.4 Plt 216 --RTC in 6 months time.    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  - PSA collected by [facility]  All questions were answered. The patient knows to call the clinic with any problems, questions or concerns.  A total of more than 30 minutes were spent face-to-face with the patient during this encounter and over half of that time was spent on counseling and coordination of care as outlined above.   Ledell Peoples, MD Department of Hematology/Oncology [place] at [facility] Phone: [phone] Pager: [phone] Email: [email or web address]  10/28/2021 2:36 PM  Literature Support:  Rene Paci, Ron Parker AM, Buadi FK, Wylie Hail, Matous JV, Anderson DM, Emmons RV, Mahindra A, Wagner LI, Dhodapkar MV, Rajkumar SV. Randomized Trial of Lenalidomide Versus Observation in Smoldering Multiple Myeloma. J Clin Oncol. 2020 Apr 10;38(11):1126-1137. doi: 10.1200/JCO.19.01740. Epub 2019 Oct 25. PMID: 80998338; PMCID: [id number]. --Progression-free survival was significantly longer with lenalidomide compared with observation (hazard ratio, 0.28; 95% CI, 0.12 to 0.62; P = .002). One-, 2-, and  3-year progression-free survival was 98%, 93%, and 91% for  the lenalidomide arm versus 89%, 76%, and 66% for the observation arm, respectively.  Lakshman, A., Rajkumar, S.V., Buadi, F.K. et al. Risk stratification of smoldering multiple myeloma incorporating revised IMWG diagnostic criteria. Blood Cancer Journal 8, 59 (2018). [email or web address] --The median TTP for low-, intermediate-, and high-risk groups were 110, 68, and 29 months, respectively (p?<?0.0001). BMPC%?>?20%, M-protein?>?2?g/dL, and FLCr?>?20 at diagnosis can be used to risk stratify patients with SMM. Patients with high-risk SMM need close follow-up and are candidates for clinical trials aiming to prevent progression.

## 2021-10-29 ENCOUNTER — Telehealth: Payer: Self-pay | Admitting: Hematology and Oncology

## 2021-10-29 LAB — KAPPA/LAMBDA LIGHT CHAINS
Kappa free light chain: 35.4 mg/L — ABNORMAL HIGH (ref 3.3–19.4)
Kappa, lambda light chain ratio: 0.07 — ABNORMAL LOW (ref 0.26–1.65)
Lambda free light chains: 477.6 mg/L — ABNORMAL HIGH (ref 5.7–26.3)

## 2021-10-29 LAB — BETA 2 MICROGLOBULIN, SERUM: Beta-2 Microglobulin: 3.2 mg/L — ABNORMAL HIGH (ref 0.6–2.4)

## 2021-10-29 NOTE — Telephone Encounter (Signed)
Per 5/24 los called and spoke to pt  pt confirmed appointment

## 2021-11-03 LAB — MULTIPLE MYELOMA PANEL, SERUM
Albumin SerPl Elph-Mcnc: 3.9 g/dL (ref 2.9–4.4)
Albumin/Glob SerPl: 1.3 (ref 0.7–1.7)
Alpha 1: 0.2 g/dL (ref 0.0–0.4)
Alpha2 Glob SerPl Elph-Mcnc: 0.9 g/dL (ref 0.4–1.0)
B-Globulin SerPl Elph-Mcnc: 1 g/dL (ref 0.7–1.3)
Gamma Glob SerPl Elph-Mcnc: 1 g/dL (ref 0.4–1.8)
Globulin, Total: 3.1 g/dL (ref 2.2–3.9)
IgA: 165 mg/dL (ref 61–437)
IgG (Immunoglobin G), Serum: 1001 mg/dL (ref 603–1613)
IgM (Immunoglobulin M), Srm: 62 mg/dL (ref 15–143)
Total Protein ELP: 7 g/dL (ref 6.0–8.5)

## 2021-11-04 DIAGNOSIS — N1832 Chronic kidney disease, stage 3b: Secondary | ICD-10-CM

## 2021-11-04 DIAGNOSIS — Z87891 Personal history of nicotine dependence: Secondary | ICD-10-CM | POA: Diagnosis not present

## 2021-11-04 DIAGNOSIS — M8580 Other specified disorders of bone density and structure, unspecified site: Secondary | ICD-10-CM

## 2021-11-04 DIAGNOSIS — I2510 Atherosclerotic heart disease of native coronary artery without angina pectoris: Secondary | ICD-10-CM | POA: Diagnosis not present

## 2021-11-04 DIAGNOSIS — E785 Hyperlipidemia, unspecified: Secondary | ICD-10-CM | POA: Diagnosis not present

## 2021-11-04 DIAGNOSIS — E1122 Type 2 diabetes mellitus with diabetic chronic kidney disease: Secondary | ICD-10-CM

## 2021-11-04 DIAGNOSIS — Z7984 Long term (current) use of oral hypoglycemic drugs: Secondary | ICD-10-CM

## 2021-11-04 DIAGNOSIS — I129 Hypertensive chronic kidney disease with stage 1 through stage 4 chronic kidney disease, or unspecified chronic kidney disease: Secondary | ICD-10-CM

## 2021-11-10 ENCOUNTER — Encounter: Payer: Self-pay | Admitting: Internal Medicine

## 2021-11-10 ENCOUNTER — Ambulatory Visit: Payer: Medicare HMO | Admitting: Internal Medicine

## 2021-11-10 VITALS — BP 140/78 | HR 60 | Ht 64.0 in | Wt 127.0 lb

## 2021-11-10 DIAGNOSIS — E1165 Type 2 diabetes mellitus with hyperglycemia: Secondary | ICD-10-CM | POA: Diagnosis not present

## 2021-11-10 DIAGNOSIS — N1832 Chronic kidney disease, stage 3b: Secondary | ICD-10-CM

## 2021-11-10 DIAGNOSIS — E1122 Type 2 diabetes mellitus with diabetic chronic kidney disease: Secondary | ICD-10-CM

## 2021-11-10 LAB — POCT GLYCOSYLATED HEMOGLOBIN (HGB A1C): Hemoglobin A1C: 9.2 % — AB (ref 4.0–5.6)

## 2021-11-10 MED ORDER — EMPAGLIFLOZIN 25 MG PO TABS: 25.0000 mg | ORAL_TABLET | Freq: Every day | ORAL | 3 refills | Status: DC

## 2021-11-10 NOTE — Progress Notes (Signed)
Name: NAM VOSSLER  Age/ Sex: 88 y.o., male   MRN/ DOB: [id number], 1931/10/07     PCP: Carollee Herter, Alferd Apa, DO   Reason for Endocrinology Evaluation: Type 2 Diabetes Mellitus  Initial Endocrine Consultative Visit: 02/06/2020    PATIENT IDENTIFIER: Mr. DONAVYN FECHER is a 88 y.o. male with a past medical history of T2DM, CAD and HTN. The patient has followed with Endocrinology clinic since 02/06/2020 for consultative assistance with management of his diabetes.  DIABETIC HISTORY:  Mr. Ellery was diagnosed with DM in 2008, has been on Glimepiride for years, janumet started in 01/2020. His hemoglobin A1c has ranged from 6.7% in 2017, peaking at 9.6%in 2021.   On initial visit his A1c was 9.6 % , he was just started on Janumet and we did not make any changes and continued Glimepiride.  Discontinued Janumet and started Jardiance only due to low GFR 08/2021    SUBJECTIVE:   During the last visit (08/18/2021): A1c 9.9%. We increased Glimepiride and switched Janumet to Jardiance   Today (11/10/2021): Mr. Bynum is here for a follow up on diabetes management.  He checks his blood sugars 1 times daily, preprandial to breakfast. The patient has hypoglycemic episodes since the last clinic visit, less frequent then in the past    Denies nausea, vomiting or diarrhea  Has frequency which is tolerable     HOME DIABETES REGIMEN:  Glimepiride 27m daily  Jardiance 10 mg daily      Statin: yes ACE-I/ARB: yes   GLUCOSE LOG: [id number]  DIABETIC COMPLICATIONS: Microvascular complications:  CKD III Denies: retinopathy, neuropathy Last Eye Exam: Completed 07/2021  Macrovascular complications:  CAD ( S/P stent) Denies:  CVA, PVD   HISTORY:  Past Medical History:  Past Medical History:  Diagnosis Date   CAD (coronary artery disease)    LHC 03/25/11 by Dr. CBurt Knack  pLAD 99%, oCFX 20-30%, pOM1 40%, dAVCFX 20-30%.  EF was normal on nuclear study.  He was treated with a Promus DES to his pLAD.     Colon polyps 1996   villous adenoma   DM type 2 (diabetes mellitus, type 2) ([facility] 2002   Fuchs' corneal dystrophy    Hyperlipidemia    Hypertension    Macular degeneration    posterior vitreous vitreous detachment   Prostate cancer ([facility] 2018   Past Surgical History:  Past Surgical History:  Procedure Laterality Date   CARDIAC CATHETERIZATION  03/25/2011   cataract Bilateral 2010   corenea implants  june and sept 2018   [facility]   CORONARY STENT PLACEMENT  03/25/2011   RADIOACTIVE SEED IMPLANT N/A 05/06/2017   Procedure: RADIOACTIVE SEED IMPLANT/BRACHYTHERAPY IMPLANT;  Surgeon: OKathie Rhodes MD;  Location: [facility]  Service: Urology;  Laterality: N/A;   SPACE OAR INSTILLATION N/A 05/06/2017   Procedure: SPACE OAR INSTILLATION;  Surgeon: OKathie Rhodes MD;  [facility]  Service: Urology;  Laterality: N/A;   Social History:  reports that he quit smoking about 63 years ago. His smoking use included cigarettes. He has a 0.75 pack-year smoking history. He has never used smokeless tobacco. He reports that he does not currently use alcohol. He reports that he does not use drugs. Family History:  Family History  Problem Relation Age of Onset   Stomach cancer Mother 953  Coronary artery disease Father 543      deceased   Healthy Son    Healthy  Son    Healthy Son      HOME MEDICATIONS: Allergies as of 11/10/2021   No Known Allergies      Medication List        Accurate as of November 10, 2021  9:21 AM. If you have any questions, ask your nurse or doctor.          Alcohol Swabs Pads Use as directed once a day   alendronate 70 MG tablet Commonly known as: FOSAMAX TAKE 1 TABLET (70 MG TOTAL) ONCE A WEEK. TAKE WITH A FULL GLASS OF WATER ON AN EMPTY STOMACH.   amLODipine 2.5 MG tablet Commonly known as: NORVASC Take 1 tablet (2.5 mg total) by mouth daily.   aspirin 81 MG tablet Take 81 mg by mouth daily.    atorvastatin 20 MG tablet Commonly known as: LIPITOR Take 1 tablet (20 mg total) by mouth daily. For cholesterol   brimonidine-timolol 0.2-0.5 % ophthalmic solution Commonly known as: COMBIGAN Place 1 drop into both eyes 2 (two) times daily.   COLLAGEN PO Take by mouth. 1 scoop daily   empagliflozin 10 MG Tabs tablet Commonly known as: Jardiance Take 1 tablet (10 mg total) by mouth daily before breakfast.   fish oil-omega-3 fatty acids 1000 MG capsule Take 2 g by mouth daily.   glimepiride 2 MG tablet Commonly known as: AMARYL Take 1 tablet (2 mg total) by mouth daily before breakfast.   losartan-hydrochlorothiazide 100-25 MG tablet Commonly known as: HYZAAR Take 1 tablet by mouth daily. For Blood pressure and kidney protection   metoprolol succinate 50 MG 24 hr tablet Commonly known as: TOPROL-XL Take 1 tablet (50 mg total) by mouth daily. TAKE WITH OR IMMEDIATELY FOLLOWING A MEAL.   multivitamin tablet Take 1 tablet by mouth daily.   tamsulosin 0.4 MG Caps capsule Commonly known as: FLOMAX TAKE 1 CAPSULE (0.4 MG TOTAL) BY MOUTH DAILY.   True Metrix Blood Glucose Test test strip Generic drug: glucose blood Use to check blood glucose once a day (type 2 DM - E 11.65)   True Metrix Level 3 High Soln Use to check controls on glucometer strips every 30 days or with each new bottle of strips (whichever comes first).   True Metrix Level 1 Low Soln USE AS DIRECTED   True Metrix Meter w/Device Kit USE AS DIRECTED ONCE A DAY   TRUEplus Lancets 33G Misc Use to check blood sugar once a day.  DX  E11.9         OBJECTIVE:   Vital Signs: BP 140/78 (BP Location: Left Arm, Patient Position: Sitting, Cuff Size: Small)   Pulse 60   Ht $R'5\' 4"'LS$  (1.626 m)   Wt 127 lb (57.6 kg)   SpO2 94%   BMI 21.80 kg/m   Wt Readings from Last 3 Encounters:  11/10/21 127 lb (57.6 kg)  10/28/21 126 lb 14.4 oz (57.6 kg)  10/05/21 125 lb (56.7 kg)     Exam: General: Pt appears well  and is in NAD  Lungs: Clear with good BS bilat with no rales, rhonchi, or wheezes  Heart: RRR with normal S1 and S2 and no gallops; no murmurs; no rub  Extremities: No pretibial edema..  Neuro: MS is good with appropriate affect, pt is alert and Ox3   DM foot exam: 02/17/2021   The skin of the feet is intact without sores or ulcerations. The pedal pulses are 1+ on right and 1+ on left. The sensation is intact to a screening  5.07, 10 gram monofilament on the right          DATA REVIEWED:  Lab Results  Component Value Date   HGBA1C 9.2 (A) 11/10/2021   HGBA1C 9.9 (H) 08/03/2021   HGBA1C 7.1 (A) 02/17/2021     Latest Reference Range & Units 10/28/21 11:14  Sodium 135 - 145 mmol/L 139  Potassium 3.5 - 5.1 mmol/L 4.2  Chloride 98 - 111 mmol/L 104  CO2 22 - 32 mmol/L 29  Glucose 70 - 99 mg/dL 216 (H)  BUN 8 - 23 mg/dL 28 (H)  Creatinine 0.61 - 1.24 mg/dL 1.52 (H)  Calcium 8.9 - 10.3 mg/dL 9.2  Anion gap 5 - 15  6  Alkaline Phosphatase 38 - 126 U/L 100  Albumin 3.5 - 5.0 g/dL 4.2  AST 15 - 41 U/L 13 (L)  ALT 0 - 44 U/L 12  Total Protein 6.5 - 8.1 g/dL 7.2  Total Bilirubin 0.3 - 1.2 mg/dL 0.6  GFR, Est Non African American >60 mL/min 44 (L)     ASSESSMENT / PLAN / RECOMMENDATIONS:   1) Type 2 Diabetes Mellitus, poorly controlled, with CKD III and neuropathic and macrovascular complications - Most recent A1c of 9.2 %. Goal A1c < 7.5 %.     -Mr. Aziz's A1c has slightly trended down  - His glucose logs are inconsistent with his A1c , pt advised to bring the meter next time rather than logs -We switched Janumet to Jardiance due to a GFR of less than 45 - he is having some urinary frequency but this has been tolerable , will increase Jardiance as below    MEDICATIONS: Continue Glimepiride 2 mg daily Increase Jardiance 25 mg daily   EDUCATION / INSTRUCTIONS: BG monitoring instructions: Patient is instructed to check his blood sugars 1 times a day. Call [place]  Endocrinology clinic if: BG persistently < 70  I reviewed the Rule of 15 for the treatment of hypoglycemia in detail with the patient. Literature supplied.    2) Diabetic complications:  Eye: Does not have known diabetic retinopathy.  Neuro/ Feet: Does have known diabetic peripheral neuropathy .  Renal: Patient does  have known baseline CKD. He   is  on an ACEI/ARB at present.    F/U in 6 months   Signed electronically by: Mack Guise, MD  [facility]  [facility] [address] [place], [place] [address] Phone: [phone] FAX: [phone]   CC: [address] [place] [address] Phone: [phone]  Fax: [phone]  Return to Endocrinology clinic as below: Future Appointments  Date Time Provider [place]  11/18/2021  1:15 PM [facility] PHARMACIST [facility]  02/01/2022  8:40 AM [name], DO [facility]  04/19/2022  9:00 AM [facility] LAB [facility] None  04/26/2022 10:00 AM Orson Slick, MD [facility] None  05/03/2022 10:40 AM Collier Salina, MD [facility] None  09/17/2022  9:40 AM [facility] HEALTH COACH [facility]

## 2021-11-10 NOTE — Patient Instructions (Addendum)
-   Continue Amaryl 2  mg daily before Breakfast  - Increase Jardiance to 25  mg , 1 tablet every morning       HOW TO TREAT LOW BLOOD SUGARS (Blood sugar LESS THAN 70 MG/DL) Please follow the RULE OF 15 for the treatment of hypoglycemia treatment (when your (blood sugars are less than 70 mg/dL)   STEP 1: Take 15 grams of carbohydrates when your blood sugar is low, which includes:  3-4 GLUCOSE TABS  OR 3-4 OZ OF JUICE OR REGULAR SODA OR ONE TUBE OF GLUCOSE GEL    STEP 2: RECHECK blood sugar in 15 MINUTES STEP 3: If your blood sugar is still low at the 15 minute recheck --> then, go back to STEP 1 and treat AGAIN with another 15 grams of carbohydrates.

## 2021-11-12 ENCOUNTER — Telehealth: Payer: Self-pay | Admitting: Pharmacist

## 2021-11-12 LAB — UPEP/UIFE/LIGHT CHAINS/TP, 24-HR UR: Total Volume: 2600

## 2021-11-12 NOTE — Telephone Encounter (Signed)
Patient called with questions about tamsulosin. He states he has not been taking and was not sure what it was for. Explain that it is to help with prostate and urinary flow. He denies urinary hesitancy, difficulty urinating or nocturia. Recommended he not restart at this time but if he starts to have symptoms to call.  He also reports that Dr Kelton Pillar increase a medication 11/10/2021 but he was not sure which on. Looks like she increased Jardiance from '10mg'$  daily to '25mg'$  daily. He has not received new dose yet - coming from mail order pharmacy. Suggested he take 2 tablets of '10mg'$  to equal '20mg'$  daily until new dose arrives.  Reminded patient of our face to face appointment next week. He will bring in all medications to review.

## 2021-11-16 ENCOUNTER — Telehealth: Payer: Self-pay | Admitting: *Deleted

## 2021-11-16 DIAGNOSIS — D472 Monoclonal gammopathy: Secondary | ICD-10-CM

## 2021-11-16 NOTE — Telephone Encounter (Signed)
TCT patient regarding recent 24 hour urine. Spoke with pt and advised that there was an error @ [facility] and they were unable to complete the 24 hour urine testing. Advised that he will need to repeat the collection. He voiced undertsanding and will be by here to pick up another container. Order placed for repeat 24 hour urine.

## 2021-11-16 NOTE — Telephone Encounter (Signed)
-----   Message from Orson Slick, MD sent at 11/16/2021  9:11 AM EDT ----- We need a repeat on this UPEP unfortunately. Was he in the batch that got ruined by [employer]?  ----- Message ----- From: Buel Ream, [facility] [place] Sent: 11/12/2021   4:38 PM EDT To: Orson Slick, MD

## 2021-11-18 ENCOUNTER — Ambulatory Visit (INDEPENDENT_AMBULATORY_CARE_PROVIDER_SITE_OTHER): Payer: Medicare HMO | Admitting: Pharmacist

## 2021-11-18 VITALS — BP 136/66 | HR 71

## 2021-11-18 DIAGNOSIS — E1169 Type 2 diabetes mellitus with other specified complication: Secondary | ICD-10-CM

## 2021-11-18 DIAGNOSIS — I2510 Atherosclerotic heart disease of native coronary artery without angina pectoris: Secondary | ICD-10-CM

## 2021-11-18 DIAGNOSIS — E1165 Type 2 diabetes mellitus with hyperglycemia: Secondary | ICD-10-CM

## 2021-11-18 DIAGNOSIS — I10 Essential (primary) hypertension: Secondary | ICD-10-CM

## 2021-11-18 MED ORDER — TAMSULOSIN HCL 0.4 MG PO CAPS: ORAL_CAPSULE | ORAL | 1 refills | Status: DC

## 2021-11-18 NOTE — Patient Instructions (Signed)
[name] It was a pleasure speaking with you  Below is a summary of your health goals and care plan  Patient Goals/Self-Care Activities take medications as prescribed  check glucose daily, document, and provide at future appointments check blood pressure 2 to 3 times per week, document, and provide at future appointments target a minimum of 150 minutes of moderate intensity exercise weekly Consider getting medication container with alarm to remind you to take medications at appropriate time.  If you have any questions or concerns, please feel free to contact me either at the phone number below or with a MyChart message.   Keep up the good work!  Cherre Robins, PharmD Clinical Pharmacist [facility] [phone] (direct line)  [phone] (main office number)   Patient verbalizes understanding of instructions and care plan provided today and agrees to view in [place]. Active MyChart status and patient understanding of how to access instructions and care plan via MyChart confirmed with patient.

## 2021-11-18 NOTE — Chronic Care Management (AMB) (Signed)
Chronic Care Management Pharmacy Note  10/14/2021 Name:  [name] MRN:  [id number] DOB:  December 20, 1931  Summary:  I was hoping patient would bring in his medications so I could verify that he has all medications on his list but he did not bring in his miedications today. He did brig in the med list that I gave him last visit and reports that he needs updated Rx for tamsulosin sent to [facility]. He also had marked that Jardiance dose has changed form 57m daily to 235mdaily. He has not received new dose of Jardiance 2571mut he is taking 2 tablets of the [facility]  Discuss adherence as there are several medications that have not been filled on time. Made a list of medications that he should be getting low on. He will check quantities when he gets home.  Patient brought in blood pressure and blood glucose monitors. Reviewed blood pressure with his monitor and office blood pressure monitor and both were at goal and similar.  Home blood glucose has been elevated but seems to be improving over the last 2 to 3 days since dose of Jardiance was increased - 165,  178, 228, 185, 217,  157, 233, 184.    Subjective: Donald RAMan 89 y42. year old male who is a primary patient of LownAnn Held.  The CCM team was consulted for assistance with disease management and care coordination needs.    Engaged with patient face to face for follow up visit in response to provider referral for pharmacy case management and/or care coordination services.   Consent to Services:  The patient was given information about Chronic Care Management services, agreed to services, and gave verbal consent prior to initiation of services.  Please see initial visit note for detailed documentation.   Patient Care Team: LownCarollee HerteronAlferd Apa as PCP - General (Family Medicine) OtteKathie Rhodes (Inactive) as Consulting Physician (Urology) MannTyler Pita as  Consulting Physician (Radiation Oncology) GiegMarilynne Halsted as Referring Physician (Ophthalmology) DorsOrson Slick as Consulting Physician (Hematology and Oncology) [facility] Crazier as Consulting Physician (Endocrinology) PaceRobley Fries as Consulting Physician (Urology) UptoMadelon Lips as Consulting Physician (Nephrology) EckaCherre RobinsH-CPP (Pharmacist)  Recent office visits: 08/03/2021 - Fam Med (Dr LownEtter SjogrenCheri Rouseventative health care / ED follow up. BP today was 120/80 and HR 57. Labs checked, Vaccines discussed. Ordered carotid duplex.     Recent consult visits: 11/10/2021 - Endo (Dr ShamKelton Pillarcontrolled DM. Increased Jardiance to 25mg11mly  10/28/2021 - Hem / Onc (Dr DorseLorenso Courier smoldering multiple myeloma and prostate cancer. Labs checked. F/U 6 months 10/05/2021 - Vascular Surgery (Dr BrabhTrula Sladesult for Carotid disease. mild bilateral stenosis which is asymptomatic.  He should continue his current medical regimen.  He will follow-up with a repeat ultrasound of his carotid arteries in 2 years 09/07/2021 - Ophthal[facility] ([facility] Bond Edilia Borium [facility]. Continue Combigan twice a day in both eyes.  08/18/2021 - Endo (Dr ShamlKelton Pillarontrolled type 2 DM. Stopped Janumet, started Jardiance 17mg 65my. Increased glimepiride to 2mg da53m before breakfast. F/U 3 months. 05/04/2021 - Rheum (Dr Rice) FBenjamine Molasteoporosis. No med changes. 04/27/2021 - Oncology / Hematology (Dr Dorsey)Lorenso Courierw up smoldering myeloma with history of prostate cancer. Labs checked. No med changes.   Hospital visits: 07/27/2021 - ED Visit for hypertensive emergency. BP  in ED was 156/59 and HR 70. EKG checked - wa sin sinus rhythm with occasional premature ventricular complexes and Premature atrial complexes. Given hydralazine and labetalol IV in ED. No medication changes noted at discharge.   Objective:  Lab Results  Component Value Date    CREATININE 1.52 (H) 10/28/2021   CREATININE 1.51 (H) 08/03/2021   CREATININE 1.29 (H) 07/27/2021    Lab Results  Component Value Date   HGBA1C 9.2 (A) 11/10/2021   Last diabetic Eye exam:  Lab Results  Component Value Date/Time   HMDIABEYEEXA No Retinopathy 08/28/2015 12:00 AM    Last diabetic Foot exam:  Lab Results  Component Value Date/Time   HMDIABFOOTEX done 10/23/2013 12:00 AM        Component Value Date/Time   CHOL 147 08/03/2021 0853   TRIG 76.0 08/03/2021 0853   HDL 42.80 08/03/2021 0853   CHOLHDL 3 08/03/2021 0853   VLDL 15.2 08/03/2021 0853   LDLCALC 89 08/03/2021 0853       Latest Ref Rng & Units 10/28/2021   11:14 AM 08/03/2021    8:53 AM 04/27/2021    2:33 PM  Hepatic Function  Total Protein 6.5 - 8.1 g/dL 7.2  6.5  6.7   Albumin 3.5 - 5.0 g/dL 4.2  4.1  3.6   AST 15 - 41 U/L 13  17  17    ALT 0 - 44 U/L 12  13  20    Alk Phosphatase 38 - 126 U/L 100  68  88   Total Bilirubin 0.3 - 1.2 mg/dL 0.6  0.7  0.4     Lab Results  Component Value Date/Time   TSH 2.47 10/20/2009 08:42 AM       Latest Ref Rng & Units 10/28/2021   11:14 AM 08/03/2021    8:53 AM 07/27/2021    3:10 PM  CBC  WBC 4.0 - 10.5 K/uL 10.4  7.9  10.6   Hemoglobin 13.0 - 17.0 g/dL 13.4  12.7  14.0   Hematocrit 39.0 - 52.0 % 41.2  38.9  41.8   Platelets 150 - 400 K/uL 216  213.0  227     Lab Results  Component Value Date/Time   VD25OH 80 01/28/2021 08:40 AM    Clinical ASCVD: Yes  The ASCVD Risk score (Arnett DK, et al., 2019) failed to calculate for the following reasons:   The 2019 ASCVD risk score is only valid for ages 32 to 49     Social History   Tobacco Use  Smoking Status Former   Packs/day: 0.25   Years: 3.00   Total pack years: 0.75   Types: Cigarettes   Quit date: 06/07/1958   Years since quitting: 63.4  Smokeless Tobacco Never   BP Readings from Last 3 Encounters:  11/18/21 136/66  11/10/21 140/78  10/28/21 (!) 172/68   Pulse Readings from Last 3  Encounters:  11/18/21 71  11/10/21 60  10/28/21 (!) 57   Wt Readings from Last 3 Encounters:  11/10/21 127 lb (57.6 kg)  10/28/21 126 lb 14.4 oz (57.6 kg)  10/05/21 125 lb (56.7 kg)    Assessment: Review of patient past medical history, allergies, medications, health status, including review of consultants reports, laboratory and other test data, was performed as part of comprehensive evaluation and provision of chronic care management services.   SDOH:  (Social Determinants of Health) assessments and interventions performed:      CCM Care Plan  No Known Allergies  Medications Reviewed Today  Reviewed by Cherre Robins, RPH-CPP (Pharmacist) on 11/18/21 at 39  Med List Status: <None>   Medication Order Taking? Sig Documenting Provider Last Dose Status Informant  Alcohol Swabs PADS [id number] Yes Use as directed once a day Carollee Herter, Alferd Apa, DO Taking Active Self  alendronate (FOSAMAX) 70 MG tablet [id number] Yes TAKE 1 TABLET (70 MG TOTAL) ONCE A WEEK. TAKE WITH A FULL GLASS OF WATER ON AN EMPTY STOMACH. Collier Salina, MD Taking Active Self  amLODipine (NORVASC) 2.5 MG tablet [id number] Yes Take 1 tablet (2.5 mg total) by mouth daily. [name], DO Taking Active Self  aspirin 81 MG tablet [id number] Yes Take 81 mg by mouth daily. [provider] Taking Active Self  atorvastatin (LIPITOR) 20 MG tablet [id number] Yes Take 1 tablet (20 mg total) by mouth daily. For cholesterol Roma Schanz R, DO Taking Active   Blood Glucose Calibration (TRUE METRIX LEVEL 1) Low SOLN [id number] Yes USE AS DIRECTED [name], Melanie Crazier, MD Taking Active Self  Blood Glucose Calibration (TRUE METRIX LEVEL 3) High SOLN [id number] Yes Use to check controls on glucometer strips every 30 days or with each new bottle of strips (whichever comes first). [name], DO Taking Active Self  Blood Glucose Monitoring Suppl (TRUE METRIX METER) w/Device Drucie Opitz [id number]  Yes USE AS DIRECTED ONCE A DAY [name], Melanie Crazier, MD Taking Active Self  brimonidine-timolol (COMBIGAN) 0.2-0.5 % ophthalmic solution [id number] Yes Place 1 drop into both eyes 2 (two) times daily. [provider] Taking Active Self  COLLAGEN PO [id number] Yes Take by mouth. 1 scoop daily [provider] Taking Active Self  empagliflozin (JARDIANCE) 25 MG TABS tablet [id number] Yes Take 1 tablet (25 mg total) by mouth daily before breakfast. [name], Melanie Crazier, MD Taking Active   fish oil-omega-3 fatty acids 1000 MG capsule [id number] Yes Take 2 g by mouth daily. [provider] Taking Active Self  glimepiride (AMARYL) 2 MG tablet [id number] Yes Take 1 tablet (2 mg total) by mouth daily before breakfast. [name], Melanie Crazier, MD Taking Active Self  glucose blood (TRUE METRIX BLOOD GLUCOSE TEST) test strip [id number] Yes Use to check blood glucose once a day (type 2 DM - E 11.65) [name], DO Taking Active   losartan-hydrochlorothiazide (HYZAAR) 100-25 MG tablet [id number] Yes Take 1 tablet by mouth daily. For Blood pressure and kidney protection Carollee Herter, Alferd Apa, DO Taking Active   metoprolol succinate (TOPROL-XL) 50 MG 24 hr tablet [id number] Yes Take 1 tablet (50 mg total) by mouth daily. TAKE WITH OR IMMEDIATELY FOLLOWING A MEAL. [name], DO Taking Active Self  Multiple Vitamin (MULTIVITAMIN) tablet [id number] Yes Take 1 tablet by mouth daily. [provider] Taking Active Self  tamsulosin (FLOMAX) 0.4 MG CAPS capsule [id number] Yes TAKE 1 CAPSULE (0.4 MG TOTAL) BY MOUTH DAILY. [name], DO Taking Active   TRUEplus Lancets 33G [place] [id number] Yes Use to check blood sugar once a day.  DX  E11.9 [name], DO Taking Active Self            Patient Active Problem List   Diagnosis Date Noted   Type 2 diabetes mellitus with hyperglycemia, without long-term current use of insulin ([place])  11/10/2021   Vitamin D deficiency 01/28/2021   Diabetes mellitus ([place]) 04/15/2020   Type 2 diabetes mellitus with stage 3b chronic kidney disease, without long-term current use of insulin ([place]) 02/06/2020   Type 2  diabetes mellitus with diabetic polyneuropathy, without long-term current use of insulin ([place]) 02/06/2020   Uncontrolled type 2 diabetes mellitus with hyperglycemia ([place]) 04/06/2019   Hyperlipidemia associated with type 2 diabetes mellitus ([place]) 04/06/2019   Monoclonal gammopathy 04/06/2019   Osteoporosis with pathological fracture of forearm 03/21/2019   Multiple closed fractures of ribs of left side 03/21/2019   Primary open angle glaucoma (POAG) of left eye, mild stage 02/04/2019   Primary open angle glaucoma (POAG) of right eye, moderate stage 02/04/2019   Preventative health care 04/01/2017   Prostate cancer ([place]) 02/15/2017   Fuchs' corneal dystrophy 08/18/2016   BPH with urinary obstruction 12/13/2012   CAD (coronary artery disease) 04/09/2011   Gastritis and gastroduodenitis 05/25/2010   COLONIC POLYPS, ADENOMATOUS, HX OF 04/20/2010   ANEMIA 10/27/2009   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, without long-term current use of insulin ([place]) 07/04/2006   Hyperlipidemia 07/04/2006   Essential hypertension 07/04/2006    Immunization History  Administered Date(s) Administered   Fluad Quad(high Dose 65+) 04/06/2019, 04/28/2020, 08/03/2021   Influenza Split 03/05/2011, 04/24/2012   Influenza Whole 02/27/2008, 03/04/2010   Influenza, High Dose Seasonal PF 04/01/2017, 03/14/2018   Influenza,inj,quad, With Preservative 03/07/2013   Influenza-Unspecified 03/08/2015   PFIZER(Purple Top)SARS-COV-2 Vaccination 08/13/2019, 09/18/2019, 03/21/2020   Pneumococcal Conjugate-13 08/26/2014   Pneumococcal Polysaccharide-23 02/27/2008, 07/31/2020    Conditions to be addressed/monitored: CAD, HTN, HLD, DMII, CKD Stage 3b, and BPH, h/o prostate cancer; multiple  myeloma; glaucoma  Care Plan : General Pharmacy (Adult)  Updates made by Cherre Robins, RPH-CPP since 11/18/2021 12:00 AM     Problem: HTN, HLD, DM   Priority: High  Onset Date: 08/15/2020     Long-Range Goal: Provide education, support and care coordination for medication therapy and chronic conditions   Start Date: 08/15/2020  Expected End Date: 02/15/2021  Recent Progress: On track  Priority: High  Note:   Current Barriers:  Not taking medications as prescribed. Patient education regarding BG goals and glucometer needed.  Patient education on blood pressure and blood pressure goals needed  Pharmacist Clinical Goal(s):  Over the next 90 days, patient will achieve adherence to monitoring guidelines and medication adherence to achieve therapeutic efficacy maintain control of blood pressure and blood sugar as evidenced by home monitoring  adhere to prescribed medication regimen as evidenced by fill dates through collaboration with PharmD and provider.   Interventions: 1:1 collaboration with Carollee Herter, Alferd Apa, DO regarding development and update of comprehensive plan of care as evidenced by provider attestation and co-signature Inter-disciplinary care team collaboration (see longitudinal plan of care) Comprehensive medication review performed; medication list updated in electronic medical record  Hypertension (BP goal <130/80) Controlled Current treatment: Amlodipine 2.39m daily Losartan-hydrochlorothiazide 100-25 mg daily  Metoprolol succinate ER 526mdaily  Medications previously tried: hydralazine (d/c post hospital) Current home readings:  per patient usually 120 - 140/ 70 - 80  Current exercise habits: Runs or stationary bike daily; lifts 20lb weight for arm exercise daily and does squats daily; also push mows own lawn and grows 50 organic tomato plants each summer.  Denies hypotensive/hypertensive symptoms; denies edema Interventions:  Educated on BP goals and benefits  of medications for prevention of heart attack, stroke and kidney damage; Recommended continue to check blood pressure 2 to 3 times per week, document, and provide log at future appointments Reviewed how to check blood pressure at home / tips for getting accurate readings.  Discussed using [employer] over-the-counter benefits to purchase new blood  pressure cuff. Provided [employer] over-the-counter catalog. (Addressed at previous visit)   Hyperlipidemia / CAD: (LDL goal < 70) Controlled Current treatment: Aspirin 81 mg daily Atorvastatin 20 mg daily Fish Oil 2g daily Medications previously tried: simvastatin (amlodipine interaction) Current exercise habits: see above Interventions:  Reviewed cholesterol goals;  Benefits of statin for ASCVD risk reduction;  Recommended to continue current medication  Diabetes (A1c goal <7%) Managed by endo - Dr Kelton Pillar Not at goal Current medications: Glimepiride 76m daily  Jardiance 263mdaily  Medications previously tried: glimepiride 32m25maily (hypoglycemia); Janumet (changed by endo Current home glucose readings: 165, 175, 228, 185, 217, 157, 233 Denies hypoglycemic/hyperglycemic symptoms; Denies blood glucose less than 80 Noted to have macrovascular and neuropathic complications  Current exercise: see above Current diet: breakfast is usually oatmeal or buckwheat and avocado or 1 egg and 1 slice of toast; Lunch - sandwich or vegetables; Dinner - eats out with his son most nights but usually salad, vegetable and either chicken, lamb or fish.  Interventions:  Reviewed home blood glucose readings and reviewed goals  Fasting blood glucose goal (before meals) = 80 to 130 Blood glucose goal after a meal = less than 180  Reviewed how to use control solution to check to see that glucometer is accurate Restart checking blood glucose daily Recommended to continue current medication Great job with exercise - continue current activity level.   CKD - stage  3B: Goal: avoid nephrotoxic medications and slow CKD progression [facility] Dr UptHollie Salkast visit 05/2020) Lab Results  Component Value Date   CREATININE 1.51 (H) 08/03/2021   CREATININE 1.29 (H) 07/27/2021   CREATININE 1.35 (H) 04/27/2021  Interventions:  Reviewed med list for meds that might need adjustment based on Scr / eGFR Patient endorsed that he does not take any NSAIDs   Osteoporosis: Goal: Reduce risk of fracture due to osteopenia/osteoporosis Managed by rheumatologist left wrist fracture 2020.  Dexa done 2022: Lowest T-Score = -2.8 at left femoral neck.  Last PTH and vitamin D WNL. Current regimen:  Alendronate 64m332mtake 1 tablet once a week - take on an empty stomach with a full glass of water in the morning at least 30 minutes before eating or taking other medications or drinking anything other than water.  Interventions: Consider daily intake of 1200mg53mcalcium daily through diet or supplementation  Intake of 1200mg 632malcium daily through diet and/or supplementation Continue alendronate  Medication management Pharmacist Clinical Goal(s): Over the next 90 days, patient will work with PharmD and providers to maintain optimal medication adherence I am concerned that patient has not filled medications on time. We reviewed each of his medications and he endorsed that he has all of them on hand.  [facility] Comprehensive medication review performed. Reviewed refill history and assessed adherence Consider getting medication container with alarm to remind you to take medications at appropriate time. Will continue to follow medication adherence   Patient Goals/Self-Care Activities Over the next 90 days, patient will:  take medications as prescribed  check glucose daily, document, and provide at future appointments check blood pressure 2 to 3 times per week, document, and provide at future  appointments target a minimum of 150 minutes of moderate intensity exercise weekly Consider getting medication container with alarm to remind you to take medications at appropriate time.  Follow Up Plan: The care management team will reach out to the patient again over the next 3 months -  he will see PCP in 4 to 6 weeks         Medication Assistance: None required.  Patient affirms current coverage meets needs.  Patient's preferred pharmacy is:  [id number] [address] [place]Phone: [phone]: [phone] [place]Phone: [phone]: [phone] [address] [place]Phone: [phone]: [phone]  Follow Up:  Patient agrees to Care Plan and Follow-up.  Plan: Face to Face appointment with care management team member scheduled for: 3 months; will see PCP In 4 to 6 weeks  TCherre Robins PharmD Clinical Pharmacist [phone]

## 2021-12-04 DIAGNOSIS — E785 Hyperlipidemia, unspecified: Secondary | ICD-10-CM

## 2021-12-04 DIAGNOSIS — E1159 Type 2 diabetes mellitus with other circulatory complications: Secondary | ICD-10-CM | POA: Diagnosis not present

## 2021-12-04 DIAGNOSIS — M818 Other osteoporosis without current pathological fracture: Secondary | ICD-10-CM | POA: Diagnosis not present

## 2021-12-04 DIAGNOSIS — N1832 Chronic kidney disease, stage 3b: Secondary | ICD-10-CM

## 2021-12-04 DIAGNOSIS — I2510 Atherosclerotic heart disease of native coronary artery without angina pectoris: Secondary | ICD-10-CM | POA: Diagnosis not present

## 2021-12-04 DIAGNOSIS — I129 Hypertensive chronic kidney disease with stage 1 through stage 4 chronic kidney disease, or unspecified chronic kidney disease: Secondary | ICD-10-CM | POA: Diagnosis not present

## 2021-12-04 DIAGNOSIS — E1122 Type 2 diabetes mellitus with diabetic chronic kidney disease: Secondary | ICD-10-CM

## 2021-12-14 ENCOUNTER — Telehealth: Payer: Medicare HMO

## 2022-02-01 ENCOUNTER — Ambulatory Visit (INDEPENDENT_AMBULATORY_CARE_PROVIDER_SITE_OTHER): Payer: Medicare HMO | Admitting: Family Medicine

## 2022-02-01 ENCOUNTER — Encounter: Payer: Self-pay | Admitting: Family Medicine

## 2022-02-01 VITALS — BP 124/68 | HR 64 | Temp 97.8°F | Resp 18 | Ht 64.0 in | Wt 126.4 lb

## 2022-02-01 DIAGNOSIS — E1136 Type 2 diabetes mellitus with diabetic cataract: Secondary | ICD-10-CM | POA: Diagnosis not present

## 2022-02-01 DIAGNOSIS — I10 Essential (primary) hypertension: Secondary | ICD-10-CM | POA: Diagnosis not present

## 2022-02-01 DIAGNOSIS — E1169 Type 2 diabetes mellitus with other specified complication: Secondary | ICD-10-CM | POA: Diagnosis not present

## 2022-02-01 DIAGNOSIS — E1165 Type 2 diabetes mellitus with hyperglycemia: Secondary | ICD-10-CM | POA: Diagnosis not present

## 2022-02-01 DIAGNOSIS — C61 Malignant neoplasm of prostate: Secondary | ICD-10-CM

## 2022-02-01 DIAGNOSIS — E785 Hyperlipidemia, unspecified: Secondary | ICD-10-CM

## 2022-02-01 LAB — COMPREHENSIVE METABOLIC PANEL
ALT: 12 U/L (ref 0–53)
AST: 11 U/L (ref 0–37)
Albumin: 4.1 g/dL (ref 3.5–5.2)
Alkaline Phosphatase: 111 U/L (ref 39–117)
BUN: 28 mg/dL — ABNORMAL HIGH (ref 6–23)
CO2: 30 mEq/L (ref 19–32)
Calcium: 9 mg/dL (ref 8.4–10.5)
Chloride: 98 mEq/L (ref 96–112)
Creatinine, Ser: 1.81 mg/dL — ABNORMAL HIGH (ref 0.40–1.50)
GFR: 33.09 mL/min — ABNORMAL LOW (ref >60.00)
Glucose, Bld: 441 mg/dL — ABNORMAL HIGH (ref 70–99)
Potassium: 4 mEq/L (ref 3.5–5.1)
Sodium: 137 mEq/L (ref 135–145)
Total Bilirubin: 0.5 mg/dL (ref 0.2–1.2)
Total Protein: 6.5 g/dL (ref 6.0–8.3)

## 2022-02-01 LAB — CBC WITH DIFFERENTIAL/PLATELET
Basophils Absolute: 0.1 10*3/uL (ref 0.0–0.1)
Basophils Relative: 0.7 % (ref 0.0–3.0)
Eosinophils Absolute: 0.1 10*3/uL (ref 0.0–0.7)
Eosinophils Relative: 0.9 % (ref 0.0–5.0)
HCT: 40.3 % (ref 39.0–52.0)
Hemoglobin: 13.3 g/dL (ref 13.0–17.0)
Lymphocytes Relative: 18.2 % (ref 12.0–46.0)
Lymphs Abs: 1.7 10*3/uL (ref 0.7–4.0)
MCHC: 33.1 g/dL (ref 30.0–36.0)
MCV: 91.4 fl (ref 78.0–100.0)
Monocytes Absolute: 0.8 10*3/uL (ref 0.1–1.0)
Monocytes Relative: 8.2 % (ref 3.0–12.0)
Neutro Abs: 6.7 10*3/uL (ref 1.4–7.7)
Neutrophils Relative %: 72 % (ref 43.0–77.0)
Platelets: 188 10*3/uL (ref 150.0–400.0)
RBC: 4.41 Mil/uL (ref 4.22–5.81)
RDW: 14.5 % (ref 11.5–15.5)
WBC: 9.3 10*3/uL (ref 4.0–10.5)

## 2022-02-01 LAB — LIPID PANEL
Cholesterol: 116 mg/dL (ref 0–200)
HDL: 37.1 mg/dL — ABNORMAL LOW (ref >39.00)
LDL Cholesterol: 42 mg/dL (ref 0–99)
NonHDL: 79.24
Total CHOL/HDL Ratio: 3
Triglycerides: 184 mg/dL — ABNORMAL HIGH (ref 0.0–149.0)
VLDL: 36.8 mg/dL (ref 0.0–40.0)

## 2022-02-01 LAB — MICROALBUMIN / CREATININE URINE RATIO
Creatinine,U: 48.9 mg/dL
Microalb Creat Ratio: 30 mg/g (ref 0.0–30.0)
Microalb, Ur: 14.7 mg/dL — ABNORMAL HIGH (ref 0.0–1.9)

## 2022-02-01 LAB — HEMOGLOBIN A1C: Hgb A1c MFr Bld: 9.6 % — ABNORMAL HIGH (ref 4.6–6.5)

## 2022-02-01 NOTE — Patient Instructions (Signed)

## 2022-02-01 NOTE — Progress Notes (Signed)
Established Patient Office Visit  Subjective   Patient ID: [name], male    DOB: Oct 10, 1931  Age: 88 y.o. MRN: [id number]  Chief Complaint  Patient presents with   Hypertension   Hyperlipidemia   Diabetes   Follow-up    HPI  Patient Active Problem List   Diagnosis Date Noted   Type 2 diabetes mellitus with hyperglycemia, without long-term current use of insulin ([place]) 11/10/2021   Vitamin D deficiency 01/28/2021   Diabetes mellitus ([place]) 04/15/2020   Type 2 diabetes mellitus with stage 3b chronic kidney disease, without long-term current use of insulin ([place]) 02/06/2020   Type 2 diabetes mellitus with diabetic polyneuropathy, without long-term current use of insulin ([place]) 02/06/2020   Uncontrolled type 2 diabetes mellitus with hyperglycemia ([place]) 04/06/2019   Hyperlipidemia associated with type 2 diabetes mellitus ([place]) 04/06/2019   Monoclonal gammopathy 04/06/2019   Osteoporosis with pathological fracture of forearm 03/21/2019   Multiple closed fractures of ribs of left side 03/21/2019   Primary open angle glaucoma (POAG) of left eye, mild stage 02/04/2019   Primary open angle glaucoma (POAG) of right eye, moderate stage 02/04/2019   Preventative health care 04/01/2017   Prostate cancer ([place]) 02/15/2017   Fuchs' corneal dystrophy 08/18/2016   BPH with urinary obstruction 12/13/2012   CAD (coronary artery disease) 04/09/2011   Gastritis and gastroduodenitis 05/25/2010   COLONIC POLYPS, ADENOMATOUS, HX OF 04/20/2010   ANEMIA 10/27/2009   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, without long-term current use of insulin ([place]) 07/04/2006   Hyperlipidemia 07/04/2006   Essential hypertension 07/04/2006   Past Medical History:  Diagnosis Date   CAD (coronary artery disease)    LHC 03/25/11 by Dr. Burt Knack:  pLAD 99%, oCFX 20-30%, pOM1 40%, dAVCFX 20-30%.  EF was normal on nuclear study.  He was treated with a Promus DES to his pLAD.    Colon polyps 1996    villous adenoma   DM type 2 (diabetes mellitus, type 2) ([place]) 2002   Fuchs' corneal dystrophy    Hyperlipidemia    Hypertension    Macular degeneration    posterior vitreous vitreous detachment   Prostate cancer ([place]) dx 2018   Past Surgical History:  Procedure Laterality Date   CARDIAC CATHETERIZATION  03/25/2011   cataract Bilateral 2010   corenea implants  june and sept 2018   [name]   CORONARY STENT PLACEMENT  03/25/2011   RADIOACTIVE SEED IMPLANT N/A 05/06/2017   Procedure: RADIOACTIVE SEED IMPLANT/BRACHYTHERAPY IMPLANT;  Surgeon: Kathie Rhodes, MD;  Location: [facility];  Service: Urology;  Laterality: N/A;   SPACE OAR INSTILLATION N/A 05/06/2017   Procedure: SPACE OAR INSTILLATION;  Surgeon: Kathie Rhodes, MD;  [facility];  Service: Urology;  Laterality: N/A;   Social History   Tobacco Use   Smoking status: Former    Packs/day: 0.25    Years: 3.00    Total pack years: 0.75    Types: Cigarettes    Quit date: 06/07/1958    Years since quitting: 63.6   Smokeless tobacco: Never  Vaping Use   Vaping Use: Never used  Substance Use Topics   Alcohol use: Not Currently   Drug use: No   Social History   Socioeconomic History   Marital status: Widowed    Spouse name: Not on file   Number of children: Not on file   Years of education: Not on file   Highest education level: Not on  file  Occupational History   Occupation: retired  Tobacco Use   Smoking status: Former    Packs/day: 0.25    Years: 3.00    Total pack years: 0.75    Types: Cigarettes    Quit date: 06/07/1958    Years since quitting: 63.6   Smokeless tobacco: Never  Vaping Use   Vaping Use: Never used  Substance and Sexual Activity   Alcohol use: Not Currently   Drug use: No   Sexual activity: Not Currently  Other Topics Concern   Not on file  Social History Narrative   Not on file   Social Determinants of Health   Financial Resource Strain:  Low Risk  (06/23/2021)   Overall Financial Resource Strain (CARDIA)    Difficulty of Paying Living Expenses: Not hard at all  Food Insecurity: No Food Insecurity (06/23/2021)   Hunger Vital Sign    Worried About Running Out of Food in the Last Year: Never true    Major in the Last Year: Never true  Transportation Needs: No Transportation Needs (06/23/2021)   PRAPARE - Hydrologist (Medical): No    Lack of Transportation (Non-Medical): No  Physical Activity: Sufficiently Active (06/23/2021)   Exercise Vital Sign    Days of Exercise per Week: 7 days    Minutes of Exercise per Session: 30 min  Stress: No Stress Concern Present (09/15/2021)   [place]    Feeling of Stress : Not at all  Social Connections: Moderately Integrated (12/18/2020)   Social Connection and Isolation Panel [NHANES]    Frequency of Communication with Friends and Family: More than three times a week    Frequency of Social Gatherings with Friends and Family: Twice a week    Attends Religious Services: More than 4 times per year    Active Member of [employer] or Organizations: Yes    Attends Archivist Meetings: More than 4 times per year    Marital Status: Widowed  Intimate Partner Violence: Not At Risk (09/15/2021)   Humiliation, Afraid, Rape, and Kick questionnaire    Fear of Current or Ex-Partner: No    Emotionally Abused: No    Physically Abused: No    Sexually Abused: No   Family Status  Relation Name Status   Mother  Deceased   Father  Deceased   Sister  Alive   Sister  Alive   Brother  Deceased at age 8   Son  Alive   Son  Alive   Son  Alive   Family History  Problem Relation Age of Onset   Stomach cancer Mother 61   Coronary artery disease Father 81       deceased   Healthy Son    Healthy Son    Healthy Son    No Known Allergies    Review of Systems  Constitutional:  Negative for  fever and malaise/fatigue.  HENT:  Negative for congestion.   Eyes:  Negative for blurred vision.  Respiratory:  Negative for shortness of breath.   Cardiovascular:  Negative for chest pain, palpitations and leg swelling.  Gastrointestinal:  Negative for abdominal pain, blood in stool and nausea.  Genitourinary:  Negative for dysuria and frequency.  Musculoskeletal:  Negative for falls.  Skin:  Negative for rash.  Neurological:  Negative for dizziness, loss of consciousness and headaches.  Endo/Heme/Allergies:  Negative for environmental allergies.  Psychiatric/Behavioral:  Negative  for depression. The patient is not nervous/anxious.       Objective:     BP 124/68 (BP Location: Left Arm, Patient Position: Sitting, Cuff Size: Normal)   Pulse 64   Temp 97.8 F (36.6 C) (Oral)   Resp 18   Ht '5\' 4"'$  (1.626 m)   Wt 126 lb 6.4 oz (57.3 kg)   SpO2 93%   BMI 21.70 kg/m  BP Readings from Last 3 Encounters:  02/01/22 124/68  11/18/21 136/66  11/10/21 140/78   Wt Readings from Last 3 Encounters:  02/01/22 126 lb 6.4 oz (57.3 kg)  11/10/21 127 lb (57.6 kg)  10/28/21 126 lb 14.4 oz (57.6 kg)   SpO2 Readings from Last 3 Encounters:  02/01/22 93%  11/10/21 94%  10/28/21 98%      Physical Exam Vitals and nursing note reviewed.  Constitutional:      Appearance: He is well-developed.  HENT:     Head: Normocephalic and atraumatic.  Eyes:     Pupils: Pupils are equal, round, and reactive to light.  Neck:     Thyroid: No thyromegaly.  Cardiovascular:     Rate and Rhythm: Normal rate and regular rhythm.     Heart sounds: No murmur heard. Pulmonary:     Effort: Pulmonary effort is normal. No respiratory distress.     Breath sounds: Normal breath sounds. No wheezing or rales.  Chest:     Chest wall: No tenderness.  Musculoskeletal:        General: No tenderness.     Cervical back: Normal range of motion and neck supple.  Skin:    General: Skin is warm and dry.   Neurological:     Mental Status: He is alert and oriented to person, place, and time.  Psychiatric:        Behavior: Behavior normal.        Thought Content: Thought content normal.        Judgment: Judgment normal.     No results found for any visits on 02/01/22.  Last CBC Lab Results  Component Value Date   WBC 10.4 10/28/2021   HGB 13.4 10/28/2021   HCT 41.2 10/28/2021   MCV 91.4 10/28/2021   MCH 29.7 10/28/2021   RDW 13.5 10/28/2021   PLT 216 03/23/5101   Last metabolic panel Lab Results  Component Value Date   GLUCOSE 216 (H) 10/28/2021   NA 139 10/28/2021   K 4.2 10/28/2021   CL 104 10/28/2021   CO2 29 10/28/2021   BUN 28 (H) 10/28/2021   CREATININE 1.52 (H) 10/28/2021   GFRNONAA 44 (L) 10/28/2021   CALCIUM 9.2 10/28/2021   PROT 7.2 10/28/2021   ALBUMIN 4.2 10/28/2021   LABGLOB 3.1 10/28/2021   BILITOT 0.6 10/28/2021   ALKPHOS 100 10/28/2021   AST 13 (L) 10/28/2021   ALT 12 10/28/2021   ANIONGAP 6 10/28/2021   Last lipids Lab Results  Component Value Date   CHOL 147 08/03/2021   HDL 42.80 08/03/2021   LDLCALC 89 08/03/2021   TRIG 76.0 08/03/2021   CHOLHDL 3 08/03/2021   Last hemoglobin A1c Lab Results  Component Value Date   HGBA1C 9.2 (A) 11/10/2021   Last thyroid functions Lab Results  Component Value Date   TSH 2.47 10/20/2009   Last vitamin D Lab Results  Component Value Date   VD25OH 80 01/28/2021   Last vitamin B12 and Folate Lab Results  Component Value Date   VITAMINB12 390 03/04/2010  FOLATE >20.0 ng/mL 03/04/2010      The ASCVD Risk score (Arnett DK, et al., 2019) failed to calculate for the following reasons:   The 2019 ASCVD risk score is only valid for ages 31 to 66    Assessment & Plan:   Problem List Items Addressed This Visit       Unprioritized   Hyperlipidemia associated with type 2 diabetes mellitus ([place]) - Primary   Relevant Orders   CBC with Differential/Platelet   Comprehensive metabolic panel    Lipid panel   Hemoglobin A1c   Microalbumin / creatinine urine ratio   Type 2 diabetes mellitus with hyperglycemia, without long-term current use of insulin ([facility])   Relevant Orders   CBC with Differential/Platelet   Comprehensive metabolic panel   Lipid panel   Hemoglobin A1c   Microalbumin / creatinine urine ratio   Uncontrolled type 2 diabetes mellitus with hyperglycemia ([facility])    Check labs  F/u endo       Prostate cancer [facility])    Per urology      Essential hypertension    Well controlled, no changes to meds. Encouraged heart healthy diet such as the DASH diet and exercise as tolerated.       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, without long-term current use of insulin ([place])    hgba1c to be checked, minimize simple carbs. Increase exercise as tolerated. Continue current meds       Other Visit Diagnoses     Primary hypertension       Relevant Orders   CBC with Differential/Platelet   Comprehensive metabolic panel   Lipid panel   Hemoglobin A1c   Microalbumin / creatinine urine ratio       Return in about 6 months (around 08/04/2022) for fasting, annual exam.    [name], DO

## 2022-02-01 NOTE — Assessment & Plan Note (Signed)
Check labs F/u endo  

## 2022-02-01 NOTE — Assessment & Plan Note (Signed)
Well controlled, no changes to meds. Encouraged heart healthy diet such as the DASH diet and exercise as tolerated.  °

## 2022-02-01 NOTE — Assessment & Plan Note (Signed)
hgba1c to be checked, minimize simple carbs. Increase exercise as tolerated. Continue current meds  

## 2022-02-01 NOTE — Assessment & Plan Note (Signed)
Per u rology 

## 2022-02-18 ENCOUNTER — Telehealth: Payer: Self-pay | Admitting: Pharmacist

## 2022-02-18 ENCOUNTER — Telehealth: Payer: Medicare HMO

## 2022-02-18 NOTE — Telephone Encounter (Signed)
  Care Management   Follow Up Note   02/18/2022 Name: [name] MRN: [id number] DOB: 03/16/1932   Referred by: [name], DO Reason for referral : No chief complaint on file.   An unsuccessful telephone outreach was attempted today. The patient was referred to the case management team for assistance with care management and care coordination.  LM on VM with Clinical Pharmacist Practitioner contact number [phone] and [phone]  Follow Up Plan: The care management team will reach out to the patient again over the next 30 days.   Cherre Robins, PharmD Clinical Pharmacist [place] [facility]

## 2022-02-19 NOTE — Chronic Care Management (AMB) (Signed)
  Chronic Care Management Note  02/19/2022 Name: [name] MRN: [id number] DOB: September 02, 1931  [name] is a 88 y.o. year old male who is a primary care patient of [name], DO and is actively engaged with the care management team. I reached out to [name] by phone today to assist with re-scheduling a follow up visit with the Pharmacist  Follow up plan: Unsuccessful telephone outreach attempt made. A HIPAA compliant phone message was left for the patient providing contact information and requesting a return call.   Julian Hy, [place] Direct Dial: [phone]

## 2022-02-23 NOTE — Chronic Care Management (AMB) (Signed)
  Chronic Care Management Note  02/23/2022 Name: KEILAND PICKERING MRN: [id number] DOB: 02-Mar-1932  [name] is a 88 y.o. year old male who is a primary care patient of [name], DO and is actively engaged with the care management team. I reached out to [name] by phone today to assist with re-scheduling a follow up visit with the Pharmacist  Follow up plan: 2nd Unsuccessful telephone outreach attempt made. A HIPAA compliant phone message was left for the patient providing contact information and requesting a return call.   Julian Hy, [place] Direct Dial: [phone]

## 2022-02-26 NOTE — Chronic Care Management (AMB) (Signed)
  Chronic Care Management Note  02/26/2022 Name: [name] MRN: [id number] DOB: 05/25/1932  [name] is a 88 y.o. year old male who is a primary care patient of [name], DO and is actively engaged with the care management team. I reached out to [name] by phone today to assist with re-scheduling a follow up visit with the Pharmacist  Follow up plan: We have been unable to make contact with the patient for follow up. The care management team is available to follow up with the patient after provider conversation with the patient regarding recommendation for care management engagement and subsequent re-referral to the care management team.   Julian Hy, [place] Direct Dial: [phone]

## 2022-03-02 ENCOUNTER — Other Ambulatory Visit: Payer: Self-pay | Admitting: Internal Medicine

## 2022-03-21 ENCOUNTER — Other Ambulatory Visit: Payer: Self-pay | Admitting: Family Medicine

## 2022-03-21 DIAGNOSIS — E1169 Type 2 diabetes mellitus with other specified complication: Secondary | ICD-10-CM

## 2022-03-21 DIAGNOSIS — I10 Essential (primary) hypertension: Secondary | ICD-10-CM

## 2022-03-31 DIAGNOSIS — H401121 Primary open-angle glaucoma, left eye, mild stage: Secondary | ICD-10-CM | POA: Diagnosis not present

## 2022-03-31 DIAGNOSIS — H401112 Primary open-angle glaucoma, right eye, moderate stage: Secondary | ICD-10-CM | POA: Diagnosis not present

## 2022-04-19 ENCOUNTER — Inpatient Hospital Stay: Payer: Medicare HMO | Attending: Hematology and Oncology

## 2022-04-19 DIAGNOSIS — Z8546 Personal history of malignant neoplasm of prostate: Secondary | ICD-10-CM | POA: Insufficient documentation

## 2022-04-19 DIAGNOSIS — I10 Essential (primary) hypertension: Secondary | ICD-10-CM | POA: Insufficient documentation

## 2022-04-19 DIAGNOSIS — E1165 Type 2 diabetes mellitus with hyperglycemia: Secondary | ICD-10-CM | POA: Insufficient documentation

## 2022-04-19 DIAGNOSIS — D472 Monoclonal gammopathy: Secondary | ICD-10-CM | POA: Insufficient documentation

## 2022-04-19 DIAGNOSIS — D649 Anemia, unspecified: Secondary | ICD-10-CM | POA: Insufficient documentation

## 2022-04-21 ENCOUNTER — Other Ambulatory Visit: Payer: Self-pay

## 2022-04-21 DIAGNOSIS — D472 Monoclonal gammopathy: Secondary | ICD-10-CM

## 2022-04-21 DIAGNOSIS — E1165 Type 2 diabetes mellitus with hyperglycemia: Secondary | ICD-10-CM | POA: Diagnosis not present

## 2022-04-21 DIAGNOSIS — I10 Essential (primary) hypertension: Secondary | ICD-10-CM | POA: Diagnosis not present

## 2022-04-21 DIAGNOSIS — Z8546 Personal history of malignant neoplasm of prostate: Secondary | ICD-10-CM | POA: Diagnosis not present

## 2022-04-21 DIAGNOSIS — D649 Anemia, unspecified: Secondary | ICD-10-CM | POA: Diagnosis not present

## 2022-04-23 LAB — UPEP/UIFE/LIGHT CHAINS/TP, 24-HR UR
% BETA, Urine: 7.3 %
ALPHA 1 URINE: 2.3 %
Albumin, U: 54.1 %
Alpha 2, Urine: 2.6 %
Free Kappa Lt Chains,Ur: 84.01 mg/L (ref 1.17–86.46)
Free Kappa/Lambda Ratio: 0.08 — ABNORMAL LOW (ref 1.83–14.26)
Free Lambda Lt Chains,Ur: 1110.65 mg/L — ABNORMAL HIGH (ref 0.27–15.21)
GAMMA GLOBULIN URINE: 33.6 %
M-SPIKE %, Urine: 28.2 % — ABNORMAL HIGH
M-Spike, Mg/24 Hr: 280 mg/24 hr — ABNORMAL HIGH
Total Protein, Urine-Ur/day: 993 mg/24 hr — ABNORMAL HIGH (ref 30–150)
Total Protein, Urine: 60.2 mg/dL
Total Volume: 1650

## 2022-04-26 ENCOUNTER — Telehealth: Payer: Self-pay | Admitting: Physician Assistant

## 2022-04-26 ENCOUNTER — Other Ambulatory Visit: Payer: Self-pay

## 2022-04-26 ENCOUNTER — Telehealth: Payer: Self-pay | Admitting: Hematology and Oncology

## 2022-04-26 ENCOUNTER — Inpatient Hospital Stay (HOSPITAL_BASED_OUTPATIENT_CLINIC_OR_DEPARTMENT_OTHER): Payer: Medicare HMO | Admitting: Physician Assistant

## 2022-04-26 ENCOUNTER — Telehealth: Payer: Self-pay | Admitting: Family Medicine

## 2022-04-26 ENCOUNTER — Inpatient Hospital Stay: Payer: Medicare HMO

## 2022-04-26 VITALS — BP 188/90 | HR 62 | Temp 97.7°F | Resp 14 | Wt 128.8 lb

## 2022-04-26 DIAGNOSIS — E1165 Type 2 diabetes mellitus with hyperglycemia: Secondary | ICD-10-CM | POA: Diagnosis not present

## 2022-04-26 DIAGNOSIS — Z8546 Personal history of malignant neoplasm of prostate: Secondary | ICD-10-CM | POA: Diagnosis not present

## 2022-04-26 DIAGNOSIS — D472 Monoclonal gammopathy: Secondary | ICD-10-CM | POA: Diagnosis not present

## 2022-04-26 DIAGNOSIS — I10 Essential (primary) hypertension: Secondary | ICD-10-CM | POA: Diagnosis not present

## 2022-04-26 DIAGNOSIS — D649 Anemia, unspecified: Secondary | ICD-10-CM | POA: Diagnosis not present

## 2022-04-26 LAB — CBC WITH DIFFERENTIAL (CANCER CENTER ONLY)
Abs Immature Granulocytes: 0.03 10*3/uL (ref 0.00–0.07)
Basophils Absolute: 0.1 10*3/uL (ref 0.0–0.1)
Basophils Relative: 1 %
Eosinophils Absolute: 0.1 10*3/uL (ref 0.0–0.5)
Eosinophils Relative: 1 %
HCT: 43.9 % (ref 39.0–52.0)
Hemoglobin: 15.1 g/dL (ref 13.0–17.0)
Immature Granulocytes: 0 %
Lymphocytes Relative: 25 %
Lymphs Abs: 2.3 10*3/uL (ref 0.7–4.0)
MCH: 30.4 pg (ref 26.0–34.0)
MCHC: 34.4 g/dL (ref 30.0–36.0)
MCV: 88.3 fL (ref 80.0–100.0)
Monocytes Absolute: 0.8 10*3/uL (ref 0.1–1.0)
Monocytes Relative: 9 %
Neutro Abs: 5.9 10*3/uL (ref 1.7–7.7)
Neutrophils Relative %: 64 %
Platelet Count: 245 10*3/uL (ref 150–400)
RBC: 4.97 MIL/uL (ref 4.22–5.81)
RDW: 13.2 % (ref 11.5–15.5)
WBC Count: 9.3 10*3/uL (ref 4.0–10.5)
nRBC: 0 % (ref 0.0–0.2)

## 2022-04-26 LAB — CMP (CANCER CENTER ONLY)
ALT: 16 U/L (ref 0–44)
AST: 15 U/L (ref 15–41)
Albumin: 4.4 g/dL (ref 3.5–5.0)
Alkaline Phosphatase: 109 U/L (ref 38–126)
Anion gap: 7 (ref 5–15)
BUN: 24 mg/dL — ABNORMAL HIGH (ref 8–23)
CO2: 30 mmol/L (ref 22–32)
Calcium: 9.7 mg/dL (ref 8.9–10.3)
Chloride: 98 mmol/L (ref 98–111)
Creatinine: 1.45 mg/dL — ABNORMAL HIGH (ref 0.61–1.24)
GFR, Estimated: 46 mL/min — ABNORMAL LOW (ref >60)
Glucose, Bld: 375 mg/dL — ABNORMAL HIGH (ref 70–99)
Potassium: 4.1 mmol/L (ref 3.5–5.1)
Sodium: 135 mmol/L (ref 135–145)
Total Bilirubin: 0.7 mg/dL (ref 0.3–1.2)
Total Protein: 7.6 g/dL (ref 6.5–8.1)

## 2022-04-26 LAB — LACTATE DEHYDROGENASE: LDH: 160 U/L (ref 98–192)

## 2022-04-26 NOTE — Telephone Encounter (Signed)
Noted  

## 2022-04-26 NOTE — Telephone Encounter (Signed)
Per 11/20 secure chat called and left message about lab appointment today

## 2022-04-26 NOTE — Progress Notes (Signed)
[place] [phone]   [phone]) [phone]  PROGRESS NOTE  Patient Care Team: Carollee Herter, Alferd Apa, DO as PCP - General (Family Medicine) Kathie Rhodes, MD (Inactive) as Consulting Physician (Urology) Tyler Pita, MD as Consulting Physician (Radiation Oncology) Marilynne Halsted, MD as Referring Physician (Ophthalmology) Orson Slick, MD as Consulting Physician (Hematology and Oncology) [facility], Melanie Crazier, MD as Consulting Physician (Endocrinology) Robley Fries, MD as Consulting Physician (Urology) Madelon Lips, MD as Consulting Physician (Nephrology) Cherre Robins, RPH-CPP (Pharmacist)  Hematological/Oncological History #Smoldering Multiple Myeloma, Intermediate Risk 1) 02/15/19: found to have ambda chains 645.5, kappa 27.9 with a ratio of 0.04, no serum M protein during nephrology work up.  2) 04/06/19: establish care with Dr. Lorenso [name] 3) 04/06/19: SPEP shows no serum monoclonal protein, however high levels of M protein detected in the urine. B2Microglobulin at 3.3. Bone Survey negative.  4)04/23/19: Bone Marrow biopsy performed, confirmed 10-15% clonal plasma cells (9% in aspirate). Confirmed diagnosis of Smoldering multiple myeloma, intermediate risk. 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 Gleason Score of 3+4 1) 05/06/17: Insertion of radioactive I-125 seeds into the prostate gland with placement of SpaceOAR; 145 Gy, definitive therapy. Radiation oncologist was Dr. Tammi Klippel at [facility] 2) Subsequently followed by outside urology with serial PSA measurements. Reported last measurement in Oct 2020 at PSA 0.2.   Interval History:  [name] 88 y.o. male with medical history significant for smoldering multiple myeloma and prostate cancer presents for a follow up visit. He was last seen on 10/28/2021 for a routine f/u. In the interim since his last visit [name] has had no changes in medications, no  hospitalizations or emergency room visits.  On exam today [name] reports he is doing well and denies any new symptoms. He continues to stay active with exercising and gardening.He is able to complete all his ADLs independently. He has a good appetite and denies any significant weight loss. He denies any nausea, vomiting or abdominal pain. His bowel habits are unchanged without any recurrent episodes of diarrhea or constipation. He denies easy bruising or signs of bleeding. He denies fevers, chills, night sweats, shortness of breath, chest pain or cough. He has no other complaints. Rest of the 10 point ROS is below.   MEDICAL HISTORY:  Past Medical History:  Diagnosis Date   CAD (coronary artery disease)    LHC 03/25/11 by Dr. Burt Knack:  pLAD 99%, oCFX 20-30%, pOM1 40%, dAVCFX 20-30%.  EF was normal on nuclear study.  He was treated with a Promus DES to his pLAD.    Colon polyps 1996   villous adenoma   DM type 2 (diabetes mellitus, type 2) ([place]) 2002   Fuchs' corneal dystrophy    Hyperlipidemia    Hypertension    Macular degeneration    posterior vitreous vitreous detachment   Prostate cancer ([place]) dx 2018    SURGICAL HISTORY: Past Surgical History:  Procedure Laterality Date   CARDIAC CATHETERIZATION  03/25/2011   cataract Bilateral 2010   corenea implants  june and sept 2018   [name]   CORONARY STENT PLACEMENT  03/25/2011   RADIOACTIVE SEED IMPLANT N/A 05/06/2017   Procedure: RADIOACTIVE SEED IMPLANT/BRACHYTHERAPY IMPLANT;  Surgeon: Kathie Rhodes, MD;  Location: [place];  Service: Urology;  Laterality: N/A;   SPACE OAR INSTILLATION N/A 05/06/2017   Procedure: SPACE OAR INSTILLATION;  Surgeon: Kathie Rhodes, MD;  Location: [facility];  Service: Urology;  Laterality: N/A;    SOCIAL HISTORY: Social History   Socioeconomic History   Marital status: Widowed    Spouse name: Not on file   Number of children: Not on file   Years of  education: Not on file   Highest education level: Not on file  Occupational History   Occupation: retired  Tobacco Use   Smoking status: Former    Packs/day: 0.25    Years: 3.00    Total pack years: 0.75    Types: Cigarettes    Quit date: 06/07/1958    Years since quitting: 63.9   Smokeless tobacco: Never  Vaping Use   Vaping Use: Never used  Substance and Sexual Activity   Alcohol use: Not Currently   Drug use: No   Sexual activity: Not Currently  Other Topics Concern   Not on file  Social History Narrative   Not on file   Social Determinants of Health   Financial Resource Strain: Low Risk  (06/23/2021)   Overall Financial Resource Strain (CARDIA)    Difficulty of Paying Living Expenses: Not hard at all  Food Insecurity: No Food Insecurity (06/23/2021)   Hunger Vital Sign    Worried About Running Out of Food in the Last Year: Never true    [place] in the Last Year: Never true  Transportation Needs: No Transportation Needs (06/23/2021)   PRAPARE - Hydrologist (Medical): No    Lack of Transportation (Non-Medical): No  Physical Activity: Sufficiently Active (06/23/2021)   Exercise Vital Sign    Days of Exercise per Week: 7 days    Minutes of Exercise per Session: 30 min  Stress: No Stress Concern Present (09/15/2021)   [place]    Feeling of Stress : Not at all  Social Connections: Moderately Integrated (12/18/2020)   Social Connection and Isolation Panel [NHANES]    Frequency of Communication with Friends and Family: More than three times a week    Frequency of Social Gatherings with Friends and Family: Twice a week    Attends Religious Services: More than 4 times per year    Active Member of [employer] or Organizations: Yes    Attends Archivist Meetings: More than 4 times per year    Marital Status: Widowed  Intimate Partner Violence: Not At Risk (09/15/2021)    Humiliation, Afraid, Rape, and Kick questionnaire    Fear of Current or Ex-Partner: No    Emotionally Abused: No    Physically Abused: No    Sexually Abused: No    FAMILY HISTORY: Family History  Problem Relation Age of Onset   Stomach cancer Mother 94   Coronary artery disease Father 51       deceased   Healthy Son    Healthy Son    Healthy Son     ALLERGIES:  has No Known Allergies.  MEDICATIONS:  Current Outpatient Medications  Medication Sig Dispense Refill   Alcohol Swabs PADS Use as directed once a day 100 each 1   alendronate (FOSAMAX) 70 MG tablet TAKE 1 TABLET (70 MG TOTAL) ONCE A WEEK. TAKE WITH A FULL GLASS OF WATER ON AN EMPTY STOMACH. 12 tablet 2   amLODipine (NORVASC) 2.5 MG tablet Take 1 tablet (2.5 mg total) by mouth daily. 90 tablet 1   aspirin 81 MG tablet Take 81 mg by mouth daily.     atorvastatin (LIPITOR) 20 MG tablet TAKE 1  TABLET (20 MG TOTAL) BY MOUTH DAILY FOR CHOLESTEROL 90 tablet 3   Blood Glucose Calibration (TRUE METRIX LEVEL 1) Low SOLN USE AS DIRECTED 1 each 3   Blood Glucose Calibration (TRUE METRIX LEVEL 3) High SOLN Use to check controls on glucometer strips every 30 days or with each new bottle of strips (whichever comes first). 3 each 1   Blood Glucose Monitoring Suppl (TRUE METRIX METER) w/Device KIT USE AS DIRECTED ONCE A DAY 1 kit 0   brimonidine-timolol (COMBIGAN) 0.2-0.5 % ophthalmic solution Place 1 drop into both eyes 2 (two) times daily.     COLLAGEN PO Take by mouth. 1 scoop daily     empagliflozin (JARDIANCE) 25 MG TABS tablet Take 1 tablet (25 mg total) by mouth daily before breakfast. 90 tablet 3   fish oil-omega-3 fatty acids 1000 MG capsule Take 2 g by mouth daily.     glimepiride (AMARYL) 2 MG tablet TAKE 1 TABLET EVERY DAY BEFORE BREAKFAST 90 tablet 1   glucose blood (TRUE METRIX BLOOD GLUCOSE TEST) test strip Use to check blood glucose once a day (type 2 DM - E 11.65) 100 strip 3   losartan-hydrochlorothiazide (HYZAAR) 100-25  MG tablet TAKE 1 TABLET BY MOUTH DAILY FOR BLOOD PRESSURE AND KIDNEY PROTECTION 90 tablet 3   metoprolol succinate (TOPROL-XL) 50 MG 24 hr tablet Take 1 tablet (50 mg total) by mouth daily. TAKE WITH OR IMMEDIATELY FOLLOWING A MEAL. 90 tablet 1   Multiple Vitamin (MULTIVITAMIN) tablet Take 1 tablet by mouth daily.     tamsulosin (FLOMAX) 0.4 MG CAPS capsule TAKE 1 CAPSULE (0.4 MG TOTAL) BY MOUTH DAILY. 90 capsule 1   TRUEplus Lancets 33G MISC Use to check blood sugar once a day.  DX  E11.9 100 each 1   No current facility-administered medications for this visit.    REVIEW OF SYSTEMS:   Constitutional: ( - ) fevers, ( - )  chills , ( - ) night sweats Eyes: ( - ) blurriness of vision, ( - ) double vision, ( - ) watery eyes Ears, nose, mouth, throat, and face: ( - ) mucositis, ( - ) sore throat Respiratory: ( - ) cough, ( - ) dyspnea, ( - ) wheezes Cardiovascular: ( - ) palpitation, ( - ) chest discomfort, ( - ) lower extremity swelling Gastrointestinal:  ( - ) nausea, ( - ) heartburn, ( - ) change in bowel habits Skin: ( - ) abnormal skin rashes Lymphatics: ( - ) new lymphadenopathy, ( - ) easy bruising Neurological: ( - ) numbness, ( - ) tingling, ( - ) new weaknesses Behavioral/Psych: ( - ) mood change, ( - ) new changes  All other systems were reviewed with the patient and are negative.  PHYSICAL EXAMINATION: ECOG PERFORMANCE STATUS: 0 - Asymptomatic  Vitals:   04/26/22 1035 04/26/22 1106  BP: (!) 182/68 (!) 188/90  Pulse: 62   Resp: 14   Temp: 97.7 F (36.5 C)   SpO2: 97%    Filed Weights   04/26/22 1035  Weight: 128 lb 12.8 oz (58.4 kg)    GENERAL: well appearing elderly Caucasian male, alert, no distress and comfortable SKIN: skin color, texture, turgor are normal, no rashes or significant lesions EYES: conjunctiva are pink and non-injected, sclera clear LUNGS: clear to auscultation and percussion with normal breathing effort HEART: regular rate & rhythm and no murmurs  and no lower extremity edema Musculoskeletal: no cyanosis of digits and no clubbing  PSYCH: alert &  oriented x 3, fluent speech NEURO: no focal motor/sensory deficits  LABORATORY DATA:  I have reviewed the data as listed     Latest Ref Rng & Units 04/26/2022   10:15 AM 02/01/2022    9:08 AM 10/28/2021   11:14 AM  CMP  Glucose 70 - 99 mg/dL 375  441  216   BUN 8 - 23 mg/dL _0 Creatinine 0.61 - 1.24 mg/dL 1.45  1.81  1.52   Sodium 135 - 145 mmol/L 135  137  139   Potassium 3.5 - 5.1 mmol/L 4.1  4.0  4.2   Chloride 98 - 111 mmol/L 98  98  104   CO2 22 - 32 mmol/L _1 Calcium 8.9 - 10.3 mg/dL 9.7  9.0  9.2   Total Protein 6.5 - 8.1 g/dL 7.6  6.5  7.2   Total Bilirubin 0.3 - 1.2 mg/dL 0.7  0.5  0.6   Alkaline Phos 38 - 126 U/L 109  111  100   AST 15 - 41 U/L _2 ALT 0 - 44 U/L _3 Latest Ref Rng & Units 04/26/2022   10:15 AM 02/01/2022    9:08 AM 10/28/2021   11:14 AM  CBC  WBC 4.0 - 10.5 K/uL 9.3  9.3  10.4   Hemoglobin 13.0 - 17.0 g/dL 15.1  13.3  13.4   Hematocrit 39.0 - 52.0 % 43.9  40.3  41.2   Platelets 150 - 400 K/uL 245  188.0  216      Surgical Pathology  CASE: [id number]  PATIENT: [name]  Bone Marrow Report   Clinical History: MGUS, left posterior iliac crest, (ADC)   DIAGNOSIS:   BONE MARROW, ASPIRATE, CLOT, CORE:  - Plasma cell myeloma, see comment.  - No amyloid deposits.  - Minimal iron stores.   PERIPHERAL BLOOD:  - Normocytic anemia.   COMMENT:   The marrow is mildly hypercellular with increased monotypic plasma cells  (9% aspirate, 10-15% CD138 immunohistochemistry). There are no amyloid  deposits seen with Congo red, although there is a lack of larger vessels  in the core biopsy. The findings are consistent with plasma cell  myeloma.   MICROSCOPIC DESCRIPTION:   PERIPHERAL BLOOD SMEAR: There is a normocytic anemia with scattered  elliptocytes/ovalocytes.  There is no rouleaux formation.   Leukocytes  are present in normal numbers.  Circulating plasma cells are not  identified.  Platelets are present in normal numbers.   BONE MARROW ASPIRATE: Spicular cellular.  Erythroid precursors: Present in appropriate proportions.  No  significant dysplasia.  Granulocytic precursors: Present in appropriate proportions.  No  significant dysplasia.  No increase in blasts.  Megakaryocytes: Present and largely unremarkable.  Lymphocytes/plasma cells: There is a mild increase in plasma cells (9%  by manual differential counts) with scattered atypical forms (large  forms, binucleation).  Lymphocytes are not increased.   TOUCH PREPARATIONS: Similar to aspirate smears.   CLOT AND BIOPSY: The core biopsies and clot sections are mildly  hypercellular for age (10 to 30%).  CD138 immunohistochemistry reveals  increased plasma cells (10 to 15%) scattered in small clusters.  By  light chain in situ hybridization plasma cells are lambda restricted.  Myeloid and erythroid elements are present in appropriate proportions.  Megakaryocytes exhibit a spectrum of maturation without tight clusters.  There are few benign appearing  lymphoid aggregates. Congo red is  negative for amyloid deposits.   IRON STAIN: Iron stains are performed on a bone marrow aspirate or touch  imprint smear and section of clot. The controls stained appropriately.        Storage Iron: Minimal.       Ring Sideroblasts: Absent.   ADDITIONAL DATA/TESTING: Cytogenetics, including FISH for myeloma, was  ordered and will be reported separately.   CELL COUNT DATA:   Bone Marrow count performed on 500 cells shows:  Blasts:   0%   Myeloid:  47%  Promyelocytes: 0%   Erythroid:     23%  Myelocytes:    5%   Lymphocytes:   17%  Metamyelocytes:     0%   Plasma cells:  9%  Bands:    16%  Neutrophils:   22%  M:E ratio:     2.04  Eosinophils:   4%  Basophils:     0%  Monocytes:     4%   Lab Data: CBC performed on 04/23/2019 shows:   WBC: 8.6 k/uL  Neutrophils:   71%  Hgb: 12.4 g/dL Lymphocytes:   25%  HCT: 38.0 %    Monocytes:     3%  MCV: 92.7 fL   Eosinophils:   0%  RDW: 13.8 %    Basophils:     1%  PLT: 221 k/uL   GROSS DESCRIPTION:   A: Aspirate smear   B: The specimen is received in B-plus fixative and consists of a 21.0 x  12.0 x 5.0 mm aggregate of red-brown clotted blood.  The specimen is  entirely submitted in cassette B.   C: The specimen is received in B-plus fixative and consists of 2 cores  of tan bone, measuring 0.6 and 0.9 cm in length by 0.2 cm in diameter.  The specimen is entirely submitted in cassette C. [name] 04/25/2019)   Final Diagnosis performed by Donald Males, MD.   Electronically signed  04/25/2019  Technical and / or Professional components performed at [facility], [place] [address], [place], [place] [address].   Immunohistochemistry Technical component (if applicable) was performed  at [facility]. [address], [place],  [place], [place] [address].   IMMUNOHISTOCHEMISTRY DISCLAIMER (if applicable):  Some of these immunohistochemical stains may have been developed and the  performance characteristics determine by [facility]. Some  may not have been cleared or approved by the U.S. Food and Drug  Administration. The FDA has determined that such clearance or approval  is not necessary. This test is used for clinical purposes. It should not  be regarded as investigational or for research. This laboratory is  certified under the [facility]  (CLIA-88) as qualified to perform high complexity clinical laboratory  testing.  The controls stained appropriately.   RADIOGRAPHIC STUDIES: No relevant radiographic studies.   ASSESSMENT & PLAN [name] is a 88 y.o. male with medical history significant for CAD s/p PCI in 2012, DM type II, Prostate cancer s/p brachytherapy, and HTN who presents for  follow up for his smoldering multiple myeloma.   #Smoldering Multiple Myeloma, Intermediate Risk --confirmed diagnosis based on the bone marrow biopsy, with plasma cells of 10-15% and no CRAB criteria. No evidence of amyloidosis on bone marrow stain.  --treatment of Smoldering multiple myeloma is a newer idea and predominately consists of monotherapy lenalidomide. Using the [facility] 2018 20/2/20 guidelines, [name] is a borderline Intermediate  Risk SMM.  --given his advance aged and lack of any CRAB criteria, I would recommend holding on treatment at this time with close clinical monitoring for progression. [name] was in agreement with close continued monitoring.  --will collect SPEP, UPEP, and SFLC on a routine basis, q 6 months.  -- will order metastatic survey yearly (next due now) --labs today show Cr 1.45, WBC 9.3, Hgb 15.1, MCV 88.3, Plt 245. SPEP and sFLC pending from today. --RTC in 6 months time unless remaining labs require any intervention.   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  - PSA collected by [facility]  #Hypertension: --BP was 188/90 today.  --Currently on Norvasc, Metoprolol  and Hyzaar  --Advised to follow up with PCP   #Hyperglycemia: --Glucose is 375 today --Currently on Jardiance and Amaryl --Advised to follow up with PCP  All questions were answered. The patient knows to call the clinic with any problems, questions or concerns.  I have spent a total of 30 minutes minutes of face-to-face and non-face-to-face time, preparing to see the patient,  performing a medically appropriate examination, counseling and educating the patient, ordering tests/procedures, communicating with other health care  professionals, documenting clinical information in the electronic health record,  and care coordination.   Dede Query PA-C Dept of Hematology and [place] at [facility] Phone: [phone]   04/26/2022 1:32 PM  Literature Support:  Tina Griffiths RZ, Ron Parker AM, Buadi FK, Wylie Hail, Matous JV, [place] DM, [place], Mahindra A, Wagner LI, Dhodapkar MV, Rajkumar SV. Randomized Trial of Lenalidomide Versus Observation in Smoldering Multiple Myeloma. J Clin Oncol. 2020 Apr 10;38(11):1126-1137. doi: 10.1200/JCO.19.01740. Epub 2019 Oct 25. PMID: 42370230; PMCID: NPI0910681. --Progression-free survival was significantly longer with lenalidomide compared with observation (hazard ratio, 0.28; 95% CI, 0.12 to 0.62; P = .002). One-, 2-, and 3-year progression-free survival was 98%, 93%, and 91% for the lenalidomide arm versus 89%, 76%, and 66% for the observation arm, respectively.  Lakshman, A., Rajkumar, S.V., Buadi, F.K. et al. Risk stratification of smoldering multiple myeloma incorporating revised IMWG diagnostic criteria. Blood Cancer Journal 8, 59 (2018). [email or web address] --The median TTP for low-, intermediate-, and high-risk groups were 110, 68, and 29 months, respectively (p?<?0.0001). BMPC%?>?20%, M-protein?>?2?g/dL, and FLCr?>?20 at diagnosis can be used to risk stratify patients with SMM. Patients with high-risk SMM need close follow-up and are candidates for clinical trials aiming to prevent progression.

## 2022-04-26 NOTE — Telephone Encounter (Signed)
Pt was seen at [facility] today and bp was 180/60, no sxs and they recommended he f/u with pcp. Sched tmr to see Lovena Le, just fyi.

## 2022-04-26 NOTE — Telephone Encounter (Signed)
Per 11/20 los called and left message for pt about appointment

## 2022-04-27 ENCOUNTER — Ambulatory Visit (INDEPENDENT_AMBULATORY_CARE_PROVIDER_SITE_OTHER): Payer: Medicare HMO | Admitting: Family Medicine

## 2022-04-27 ENCOUNTER — Encounter: Payer: Self-pay | Admitting: Family Medicine

## 2022-04-27 VITALS — BP 146/65 | HR 60 | Temp 97.8°F | Resp 18 | Ht 64.0 in | Wt 129.2 lb

## 2022-04-27 DIAGNOSIS — I10 Essential (primary) hypertension: Secondary | ICD-10-CM | POA: Diagnosis not present

## 2022-04-27 LAB — KAPPA/LAMBDA LIGHT CHAINS
Kappa free light chain: 33.5 mg/L — ABNORMAL HIGH (ref 3.3–19.4)
Kappa, lambda light chain ratio: 0.06 — ABNORMAL LOW (ref 0.26–1.65)
Lambda free light chains: 529.7 mg/L — ABNORMAL HIGH (ref 5.7–26.3)

## 2022-04-27 LAB — BETA 2 MICROGLOBULIN, SERUM: Beta-2 Microglobulin: 3.4 mg/L — ABNORMAL HIGH (ref 0.6–2.4)

## 2022-04-27 MED ORDER — AMLODIPINE BESYLATE 5 MG PO TABS: 7.5000 mg | ORAL_TABLET | Freq: Every day | ORAL | 2 refills | Status: DC

## 2022-04-27 NOTE — Progress Notes (Signed)
Acute Office Visit  Subjective:     Patient ID: [name], male    DOB: April 20, 1932, 88 y.o.   MRN: [id number]  Chief Complaint  Patient presents with   Hypertension    Pt states bps have been in the high 180s to 190s with a low bottom number.   Follow-up    Hypertension   Patient is in today for BP concerns.   HYPERTENSION: - For the past month he has noticed higher readings at home. He has been feeling great overall, no changes.  - Medications: amlodipine 5 mg, losartan-HCTZ 100-25 mg daily, metoprolol succinate 50 mg daily - Compliance: good - Checking BP at home: yes, SBP 180-190s for the past few weeks - Denies any SOB, recurrent headaches, CP, vision changes, LE edema, dizziness, palpitations, or medication side effects. - Diet: heart healthy, no added sodium, lots of veggies/fruits - Exercise: runs regularly - After September labs, he was encouraged to f/u with his nephrologist. States that he did see them, but they did not make any changes. - he will bring records to next appointment - CMP yesterday at oncology was at his baseline    ROS All review of systems negative except what is listed in the HPI      Objective:    BP (!) 146/65   Pulse 60   Temp 97.8 F (36.6 C) (Oral)   Resp 18   Ht '5\' 4"'$  (1.626 m)   Wt 129 lb 3.2 oz (58.6 kg)   SpO2 95%   BMI 22.18 kg/m    Physical Exam Vitals reviewed.  Constitutional:      Appearance: Normal appearance.  Cardiovascular:     Rate and Rhythm: Normal rate and regular rhythm.  Pulmonary:     Effort: Pulmonary effort is normal.     Breath sounds: Normal breath sounds.  Musculoskeletal:     Right lower leg: No edema.     Left lower leg: No edema.  Skin:    General: Skin is warm and dry.  Neurological:     General: No focal deficit present.     Mental Status: He is alert and oriented to person, place, and time. Mental status is at baseline.     Motor: No weakness.     Coordination: Coordination  normal.     Gait: Gait normal.  Psychiatric:        Mood and Affect: Mood normal.        Behavior: Behavior normal.        Thought Content: Thought content normal.        Judgment: Judgment normal.     No results found for any visits on 04/27/22.      Assessment & Plan:   Problem List Items Addressed This Visit   None Visit Diagnoses     Primary hypertension    Blood pressure is not at goal for age and co-morbidities.  Repeat office reading significantly improved. Recommendations: Increase amlodipine from 5 mg daily to 7.5 mg daily and continue other daily meds as prescribed for now. Follow-up with Dr. Etter Sjogren in 2 weeks.  - BP goal <130/80 - monitor and log blood pressures at home - check around the same time each day in a relaxed setting - Limit salt to <2000 mg/day - Follow DASH eating plan (heart healthy diet) - limit alcohol to 2 standard drinks per day for men and 1 per day for women - avoid tobacco products - get at least 2  hours of regular aerobic exercise weekly Patient aware of signs/symptoms requiring further/urgent evaluation.    Relevant Medications   amLODipine (NORVASC) 5 MG tablet       Meds ordered this encounter  Medications   amLODipine (NORVASC) 5 MG tablet    Sig: Take 1.5 tablets (7.5 mg total) by mouth daily.    Dispense:  45 tablet    Refill:  2    Order Specific Question:   Supervising Provider    Answer:   Penni Homans A [[id number]]    Return in about 2 weeks (around 05/11/2022) for HTN f/u with [place].  Terrilyn Saver, NP

## 2022-04-27 NOTE — Assessment & Plan Note (Signed)
Blood pressure is not at goal for age and co-morbidities.  Repeat office reading significantly improved. Recommendations: Increase amlodipine from 5 mg daily to 7.5 mg daily and continue other daily meds as prescribed for now. Follow-up with Dr. Etter Sjogren in 2 weeks.  - BP goal <130/80 - monitor and log blood pressures at home - check around the same time each day in a relaxed setting - Limit salt to <2000 mg/day - Follow DASH eating plan (heart healthy diet) - limit alcohol to 2 standard drinks per day for men and 1 per day for women - avoid tobacco products - get at least 2 hours of regular aerobic exercise weekly Patient aware of signs/symptoms requiring further/urgent evaluation.

## 2022-04-27 NOTE — Patient Instructions (Addendum)
Blood pressure is not at goal for age and co-morbidities.  Repeat office reading significantly improved. Recommendations: Increase amlodipine from 5 mg daily to 7.5 mg daily and continue other daily meds as prescribed for now. Follow-up with Dr. Etter Sjogren in 2 weeks.  - BP goal <130/80 - monitor and log blood pressures at home - check around the same time each day in a relaxed setting - Limit salt to <2000 mg/day - Follow DASH eating plan (heart healthy diet) - limit alcohol to 2 standard drinks per day for men and 1 per day for women - avoid tobacco products - get at least 2 hours of regular aerobic exercise weekly Patient aware of signs/symptoms requiring further/urgent evaluation.

## 2022-05-03 ENCOUNTER — Ambulatory Visit: Payer: Medicare HMO | Admitting: Internal Medicine

## 2022-05-03 LAB — MULTIPLE MYELOMA PANEL, SERUM
Albumin SerPl Elph-Mcnc: 3.7 g/dL (ref 2.9–4.4)
Albumin/Glob SerPl: 1.4 (ref 0.7–1.7)
Alpha 1: 0.2 g/dL (ref 0.0–0.4)
Alpha2 Glob SerPl Elph-Mcnc: 0.7 g/dL (ref 0.4–1.0)
B-Globulin SerPl Elph-Mcnc: 0.9 g/dL (ref 0.7–1.3)
Gamma Glob SerPl Elph-Mcnc: 0.9 g/dL (ref 0.4–1.8)
Globulin, Total: 2.7 g/dL (ref 2.2–3.9)
IgA: 183 mg/dL (ref 61–437)
IgG (Immunoglobin G), Serum: 1026 mg/dL (ref 603–1613)
IgM (Immunoglobulin M), Srm: 70 mg/dL (ref 15–143)
Total Protein ELP: 6.4 g/dL (ref 6.0–8.5)

## 2022-05-04 ENCOUNTER — Ambulatory Visit (HOSPITAL_COMMUNITY)
Admission: RE | Admit: 2022-05-04 | Discharge: 2022-05-04 | Disposition: A | Discharge status: Home / Self Care | Payer: Medicare HMO | Source: Ambulatory Visit | Attending: Physician Assistant | Admitting: Physician Assistant

## 2022-05-04 DIAGNOSIS — D472 Monoclonal gammopathy: Secondary | ICD-10-CM | POA: Diagnosis not present

## 2022-05-04 DIAGNOSIS — C9000 Multiple myeloma not having achieved remission: Secondary | ICD-10-CM | POA: Diagnosis not present

## 2022-05-06 ENCOUNTER — Telehealth: Payer: Self-pay | Admitting: *Deleted

## 2022-05-06 NOTE — Telephone Encounter (Signed)
-----  Message from Lincoln Brigham, PA-C sent at 05/06/2022  1:44 PM EST ----- Please notify patient that labs and bone survey look stable. No evidence of transformation to active multiple myeloma. Continue to monitor and see back in 6 months as scheduled.

## 2022-05-06 NOTE — Telephone Encounter (Signed)
Attempted to reach patient to relay lab values, left message pending call back.

## 2022-05-10 NOTE — Telephone Encounter (Signed)
Communicated results to the patient over phone.

## 2022-05-11 ENCOUNTER — Ambulatory Visit (INDEPENDENT_AMBULATORY_CARE_PROVIDER_SITE_OTHER): Payer: Medicare HMO | Admitting: Family Medicine

## 2022-05-11 ENCOUNTER — Encounter: Payer: Self-pay | Admitting: Family Medicine

## 2022-05-11 VITALS — BP 142/68 | HR 62 | Temp 97.4°F | Resp 18 | Ht 64.0 in | Wt 134.4 lb

## 2022-05-11 DIAGNOSIS — I10 Essential (primary) hypertension: Secondary | ICD-10-CM | POA: Diagnosis not present

## 2022-05-11 DIAGNOSIS — E1165 Type 2 diabetes mellitus with hyperglycemia: Secondary | ICD-10-CM

## 2022-05-11 DIAGNOSIS — I6523 Occlusion and stenosis of bilateral carotid arteries: Secondary | ICD-10-CM

## 2022-05-11 DIAGNOSIS — E1122 Type 2 diabetes mellitus with diabetic chronic kidney disease: Secondary | ICD-10-CM | POA: Diagnosis not present

## 2022-05-11 DIAGNOSIS — C61 Malignant neoplasm of prostate: Secondary | ICD-10-CM | POA: Diagnosis not present

## 2022-05-11 DIAGNOSIS — E785 Hyperlipidemia, unspecified: Secondary | ICD-10-CM | POA: Diagnosis not present

## 2022-05-11 DIAGNOSIS — N1832 Chronic kidney disease, stage 3b: Secondary | ICD-10-CM | POA: Diagnosis not present

## 2022-05-11 DIAGNOSIS — E1169 Type 2 diabetes mellitus with other specified complication: Secondary | ICD-10-CM | POA: Diagnosis not present

## 2022-05-11 MED ORDER — AMLODIPINE BESYLATE 10 MG PO TABS: 10.0000 mg | ORAL_TABLET | Freq: Every day | ORAL | 3 refills | Status: DC

## 2022-05-11 NOTE — Assessment & Plan Note (Signed)
hgba1c to be checked, minimize simple carbs. Increase exercise as tolerated. Continue current meds  

## 2022-05-11 NOTE — Progress Notes (Signed)
Subjective:   By signing my name below, I, Shehryar Baig, attest that this documentation has been prepared under the direction and in the presence of Dr. Roma Schanz, DO. 05/11/2022    Patient ID: [name], male    DOB: [birth year], 88 y.o.   MRN: [id number]  Chief Complaint  Patient presents with   Hypertension   Follow-up    Hypertension Pertinent negatives include no blurred vision, chest pain, headaches, malaise/fatigue, palpitations or shortness of breath.   Patient is in today for a follow up visit.   His blood pressure is elevated during this visit. He checks his blood pressure at home regularly and reports the systolic typically measures 200-175. He continues taking 5 mg amlodipine daily PO, 100-25 mg losartan-hydrochlorothiazide daily PO, 50 mg metoprolol succinate daily PO and reports no new issues while taking them. He denies having any leg swelling.  BP Readings from Last 3 Encounters:  05/11/22 (!) 142/68  04/27/22 (!) 146/65  04/26/22 (!) 188/90   Pulse Readings from Last 3 Encounters:  05/11/22 62  04/27/22 60  04/26/22 62   He continues gardening regularly. He is UTD on medication are refills at this time. He continues following up with his urologist regularly. He is UTD on vision care.  He is no longer receiving treatment for glaucoma. He reports he still has 20/20 vision.  He is UTD on flu vaccine this year.     Past Medical History:  Diagnosis Date   CAD (coronary artery disease)    LHC 03/25/11 by Dr. Burt Knack:  pLAD 99%, oCFX 20-30%, pOM1 40%, dAVCFX 20-30%.  EF was normal on nuclear study.  He was treated with a Promus DES to his pLAD.    Colon polyps 1996   villous adenoma   DM type 2 (diabetes mellitus, type 2) ([place]) 2002   Fuchs' corneal dystrophy    Hyperlipidemia    Hypertension    Macular degeneration    posterior vitreous vitreous detachment   Prostate cancer ([place]) dx 2018    Past Surgical History:  Procedure Laterality Date    CARDIAC CATHETERIZATION  03/25/2011   cataract Bilateral 2010   corenea implants  june and sept 2018   [name]   CORONARY STENT PLACEMENT  03/25/2011   RADIOACTIVE SEED IMPLANT N/A 05/06/2017   Procedure: RADIOACTIVE SEED IMPLANT/BRACHYTHERAPY IMPLANT;  Surgeon: Kathie Rhodes, MD;  Location: [facility];  Service: Urology;  Laterality: N/A;   SPACE OAR INSTILLATION N/A 05/06/2017   Procedure: SPACE OAR INSTILLATION;  Surgeon: Kathie Rhodes, MD;  Location: [facility];  Service: Urology;  Laterality: N/A;    Family History  Problem Relation Age of Onset   Stomach cancer Mother 64   Coronary artery disease Father 24       deceased   Healthy Son    Healthy Son    Healthy Son     Social History   Socioeconomic History   Marital status: Widowed    Spouse name: Not on file   Number of children: Not on file   Years of education: Not on file   Highest education level: Not on file  Occupational History   Occupation: retired  Tobacco Use   Smoking status: Former    Packs/day: 0.25    Years: 3.00    Total pack years: 0.75    Types: Cigarettes    Quit date: 06/07/1958    Years since quitting: 63.9   Smokeless tobacco: Never  Vaping Use   Vaping Use: Never used  Substance and Sexual Activity   Alcohol use: Not Currently   Drug use: No   Sexual activity: Not Currently  Other Topics Concern   Not on file  Social History Narrative   Not on file   Social Determinants of Health   Financial Resource Strain: Low Risk  (06/23/2021)   Overall Financial Resource Strain (CARDIA)    Difficulty of Paying Living Expenses: Not hard at all  Food Insecurity: No Food Insecurity (06/23/2021)   Hunger Vital Sign    Worried About Running Out of Food in the Last Year: Never true    Ran Out of Food in the Last Year: Never true  Transportation Needs: No Transportation Needs (06/23/2021)   PRAPARE - Hydrologist (Medical): No     Lack of Transportation (Non-Medical): No  Physical Activity: Sufficiently Active (06/23/2021)   Exercise Vital Sign    Days of Exercise per Week: 7 days    Minutes of Exercise per Session: 30 min  Stress: No Stress Concern Present (09/15/2021)   [place]    Feeling of Stress : Not at all  Social Connections: Moderately Integrated (12/18/2020)   Social Connection and Isolation Panel [NHANES]    Frequency of Communication with Friends and Family: More than three times a week    Frequency of Social Gatherings with Friends and Family: Twice a week    Attends Religious Services: More than 4 times per year    Active Member of [employer] or Organizations: Yes    Attends Archivist Meetings: More than 4 times per year    Marital Status: Widowed  Intimate Partner Violence: Not At Risk (09/15/2021)   Humiliation, Afraid, Rape, and Kick questionnaire    Fear of Current or Ex-Partner: No    Emotionally Abused: No    Physically Abused: No    Sexually Abused: No    Outpatient Medications Prior to Visit  Medication Sig Dispense Refill   Alcohol Swabs PADS Use as directed once a day 100 each 1   alendronate (FOSAMAX) 70 MG tablet TAKE 1 TABLET (70 MG TOTAL) ONCE A WEEK. TAKE WITH A FULL GLASS OF WATER ON AN EMPTY STOMACH. 12 tablet 2   aspirin 81 MG tablet Take 81 mg by mouth daily.     atorvastatin (LIPITOR) 20 MG tablet TAKE 1 TABLET (20 MG TOTAL) BY MOUTH DAILY FOR CHOLESTEROL 90 tablet 3   Blood Glucose Calibration (TRUE METRIX LEVEL 1) Low SOLN USE AS DIRECTED 1 each 3   Blood Glucose Calibration (TRUE METRIX LEVEL 3) High SOLN Use to check controls on glucometer strips every 30 days or with each new bottle of strips (whichever comes first). 3 each 1   Blood Glucose Monitoring Suppl (TRUE METRIX METER) w/Device KIT USE AS DIRECTED ONCE A DAY 1 kit 0   brimonidine-timolol (COMBIGAN) 0.2-0.5 % ophthalmic solution Place 1 drop  into both eyes 2 (two) times daily.     COLLAGEN PO Take by mouth. 1 scoop daily     empagliflozin (JARDIANCE) 25 MG TABS tablet Take 1 tablet (25 mg total) by mouth daily before breakfast. 90 tablet 3   fish oil-omega-3 fatty acids 1000 MG capsule Take 2 g by mouth daily.     glimepiride (AMARYL) 2 MG tablet TAKE 1 TABLET EVERY DAY BEFORE BREAKFAST 90 tablet 1   glucose blood (TRUE METRIX BLOOD GLUCOSE TEST)  test strip Use to check blood glucose once a day (type 2 DM - E 11.65) 100 strip 3   losartan-hydrochlorothiazide (HYZAAR) 100-25 MG tablet TAKE 1 TABLET BY MOUTH DAILY FOR BLOOD PRESSURE AND KIDNEY PROTECTION 90 tablet 3   metoprolol succinate (TOPROL-XL) 50 MG 24 hr tablet Take 1 tablet (50 mg total) by mouth daily. TAKE WITH OR IMMEDIATELY FOLLOWING A MEAL. 90 tablet 1   Multiple Vitamin (MULTIVITAMIN) tablet Take 1 tablet by mouth daily.     tamsulosin (FLOMAX) 0.4 MG CAPS capsule TAKE 1 CAPSULE (0.4 MG TOTAL) BY MOUTH DAILY. 90 capsule 1   TRUEplus Lancets 33G MISC Use to check blood sugar once a day.  DX  E11.9 100 each 1   amLODipine (NORVASC) 5 MG tablet Take 1.5 tablets (7.5 mg total) by mouth daily. 45 tablet 2   No facility-administered medications prior to visit.    No Known Allergies  Review of Systems  Constitutional:  Negative for fever and malaise/fatigue.  HENT:  Negative for congestion.   Eyes:  Negative for blurred vision.  Respiratory:  Negative for shortness of breath.   Cardiovascular:  Negative for chest pain, palpitations and leg swelling.  Gastrointestinal:  Negative for abdominal pain, blood in stool and nausea.  Genitourinary:  Negative for dysuria and frequency.  Musculoskeletal:  Negative for falls.  Skin:  Negative for rash.  Neurological:  Negative for dizziness, loss of consciousness and headaches.  Endo/Heme/Allergies:  Negative for environmental allergies.  Psychiatric/Behavioral:  Negative for depression. The patient is not nervous/anxious.         Objective:    Physical Exam Vitals and nursing note reviewed.  Constitutional:      General: He is not in acute distress.    Appearance: Normal appearance. He is not ill-appearing.  HENT:     Head: Normocephalic and atraumatic.     Right Ear: External ear normal.     Left Ear: External ear normal.  Eyes:     Extraocular Movements: Extraocular movements intact.     Pupils: Pupils are equal, round, and reactive to light.  Cardiovascular:     Rate and Rhythm: Normal rate and regular rhythm.     Heart sounds: Normal heart sounds. No murmur heard.    No gallop.  Pulmonary:     Effort: Pulmonary effort is normal. No respiratory distress.     Breath sounds: Normal breath sounds. No wheezing or rales.  Skin:    General: Skin is warm and dry.  Neurological:     Mental Status: He is alert and oriented to person, place, and time.  Psychiatric:        Judgment: Judgment normal.     BP (!) 142/68 (BP Location: Left Arm, Patient Position: Sitting, Cuff Size: Normal)   Pulse 62   Temp (!) 97.4 F (36.3 C) (Oral)   Resp 18   Ht _0  (1.626 m)   Wt 134 lb 6.4 oz (61 kg)   SpO2 96%   BMI 23.07 kg/m  Wt Readings from Last 3 Encounters:  05/11/22 134 lb 6.4 oz (61 kg)  04/27/22 129 lb 3.2 oz (58.6 kg)  04/26/22 128 lb 12.8 oz (58.4 kg)    Diabetic Foot Exam - Simple   No data filed    Lab Results  Component Value Date   WBC 9.3 04/26/2022   HGB 15.1 04/26/2022   HCT 43.9 04/26/2022   PLT 245 04/26/2022   GLUCOSE 375 (H) 04/26/2022   CHOL  116 02/01/2022   TRIG 184.0 (H) 02/01/2022   HDL 37.10 (L) 02/01/2022   LDLCALC 42 02/01/2022   ALT 16 04/26/2022   AST 15 04/26/2022   NA 135 04/26/2022   K 4.1 04/26/2022   CL 98 04/26/2022   CREATININE 1.45 (H) 04/26/2022   BUN 24 (H) 04/26/2022   CO2 30 04/26/2022   TSH 2.47 10/20/2009   PSA 0.02 (L) 08/03/2021   INR 1.03 05/02/2017   HGBA1C 9.6 (H) 02/01/2022   MICROALBUR 14.7 (H) 02/01/2022    Lab Results   Component Value Date   TSH 2.47 10/20/2009   Lab Results  Component Value Date   WBC 9.3 04/26/2022   HGB 15.1 04/26/2022   HCT 43.9 04/26/2022   MCV 88.3 04/26/2022   PLT 245 04/26/2022   Lab Results  Component Value Date   NA 135 04/26/2022   K 4.1 04/26/2022   CO2 30 04/26/2022   GLUCOSE 375 (H) 04/26/2022   BUN 24 (H) 04/26/2022   CREATININE 1.45 (H) 04/26/2022   BILITOT 0.7 04/26/2022   ALKPHOS 109 04/26/2022   AST 15 04/26/2022   ALT 16 04/26/2022   PROT 7.6 04/26/2022   ALBUMIN 4.4 04/26/2022   CALCIUM 9.7 04/26/2022   ANIONGAP 7 04/26/2022   GFR 33.09 (L) 02/01/2022   Lab Results  Component Value Date   CHOL 116 02/01/2022   Lab Results  Component Value Date   HDL 37.10 (L) 02/01/2022   Lab Results  Component Value Date   LDLCALC 42 02/01/2022   Lab Results  Component Value Date   TRIG 184.0 (H) 02/01/2022   Lab Results  Component Value Date   CHOLHDL 3 02/01/2022   Lab Results  Component Value Date   HGBA1C 9.6 (H) 02/01/2022       Assessment & Plan:   Problem List Items Addressed This Visit       Unprioritized   Type 2 diabetes mellitus with stage 3b chronic kidney disease, without long-term current use of insulin ([place])    Per endo       Type 2 diabetes mellitus with hyperglycemia, without long-term current use of insulin ([place])    hgba1c to be checked , minimize simple carbs. Increase exercise as tolerated. Continue current meds       Prostate cancer ([place])   Hyperlipidemia associated with type 2 diabetes mellitus ([place])    Encourage heart healthy diet such as MIND or DASH diet, increase exercise, avoid trans fats, simple carbohydrates and processed foods, consider a krill or fish or flaxseed oil cap daily.        Relevant Medications   amLODipine (NORVASC) 10 MG tablet   Essential hypertension    Well controlled, no changes to meds. Encouraged heart healthy diet such as the DASH diet and exercise as tolerated.        Relevant  Medications   amLODipine (NORVASC) 10 MG tablet   Other Visit Diagnoses     Primary hypertension    -  Primary   Relevant Medications   amLODipine (NORVASC) 10 MG tablet   Bilateral carotid artery stenosis       Relevant Medications   amLODipine (NORVASC) 10 MG tablet        Meds ordered this encounter  Medications   amLODipine (NORVASC) 10 MG tablet    Sig: Take 1 tablet (10 mg total) by mouth daily.    Dispense:  90 tablet    Refill:  3    I,  [name], DO, personally preformed the services described in this documentation.  All medical record entries made by the scribe were at my direction and in my presence.  I have reviewed the chart and discharge instructions (if applicable) and agree that the record reflects my personal performance and is accurate and complete. 05/11/2022   I,Shehryar Baig,acting as a scribe for [name], DO.,have documented all relevant documentation on the behalf of [name], DO,as directed by  [name], DO while in the presence of [name], DO.   [name], DO

## 2022-05-11 NOTE — Patient Instructions (Signed)

## 2022-05-11 NOTE — Assessment & Plan Note (Signed)
Per endo °

## 2022-05-11 NOTE — Assessment & Plan Note (Signed)
Encourage heart healthy diet such as MIND or DASH diet, increase exercise, avoid trans fats, simple carbohydrates and processed foods, consider a krill or fish or flaxseed oil cap daily.  °

## 2022-05-11 NOTE — Assessment & Plan Note (Signed)
Well controlled, no changes to meds. Encouraged heart healthy diet such as the DASH diet and exercise as tolerated.  °

## 2022-05-12 ENCOUNTER — Other Ambulatory Visit: Payer: Self-pay | Admitting: Family Medicine

## 2022-05-12 DIAGNOSIS — I10 Essential (primary) hypertension: Secondary | ICD-10-CM

## 2022-05-14 ENCOUNTER — Other Ambulatory Visit: Payer: Self-pay | Admitting: Internal Medicine

## 2022-05-14 DIAGNOSIS — M80032D Age-related osteoporosis with current pathological fracture, left forearm, subsequent encounter for fracture with routine healing: Secondary | ICD-10-CM

## 2022-05-14 NOTE — Telephone Encounter (Signed)
Next Visit: 05/26/2022  Last Visit: 05/04/2021  Last Fill: 06/18/2021  DX: Osteoporosis with pathological fracture of left forearm with routine healing, subsequent encounter   Current Dose per office note on 05/04/2021: Plan to continue alendronate 70 mg PO weekly can f/u in 1 year    Labs: 04/26/2022 glucose 375, BUN 24, creatinine 1.45, GFR 46  Okay to refill fosamax?

## 2022-05-25 NOTE — Progress Notes (Deleted)
Office Visit Note  Patient: [name]             Date of Birth: 06/07/1932           MRN: [id number]             PCP: [name], DO Referring: [name], * Visit Date: 05/26/2022   Subjective:  No chief complaint on file.   History of Present Illness: Donald CHING is a 88 y.o. male here for follow up for osteoporosis on alendronate 70 mg weekly.***   Previous HPI 05/04/21 [name] is a 88 y.o. male here for follow up for osteoporosis treatment after new alendronate 54m PO weekly start. He had recent labs checked last week including CBC and CMP and no significant change in renal function was identified. Oncology clinic follow up without new concerns continuing observation. He remains pretty active exercising regularly with stationary bike and reports healthy diet. He had questions about his systolic blood pressure, I briefly reviewed this with him he has a widened pulse pressure and would not recommend much more aggressive targets with diastolic pressure in low 696G   Previous HPI 01/28/21 JMUSTAF ANTONACCIis a 88y.o. male here for osteoporosis. 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  Bone density testing checked in July demonstrated osteoporosis with the lowest BMD in the left femoral neck -2.8.  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  He denies any recent falls.  He tries to focus on a healthy diet focusing on fresh foods and some  organic foods.  He takes a daily vitamin D supplement already. He has no history of kidney stones.  He has no history of thyroid disease.   Labs reviewed 10/2020 CMP eGFR 42.82 Ca 9.4     12/18/20 Bone densitometry AP Spine L1-L4 -0.8 1.079 g/cm2 Left femoral neck -2.8 0.644 g/cm2 Right forearm radius 33% -1.7 0.817 g/cm2   No Rheumatology ROS completed.   PMFS History:  Patient Active Problem List   Diagnosis Date Noted   Type 2 diabetes mellitus with hyperglycemia, without long-term current use of insulin ([place] 11/10/2021   Vitamin D deficiency 01/28/2021   Diabetes mellitus ([facility] 04/15/2020   Type 2 diabetes mellitus with stage 3b chronic kidney disease, without long-term current use of insulin ([facility] 02/06/2020   Type 2 diabetes mellitus with diabetic polyneuropathy, without long-term current use of insulin ([facility] 02/06/2020   Uncontrolled type 2 diabetes mellitus with hyperglycemia ([facility] [place] 04/06/2019   Hyperlipidemia associated with type 2 diabetes mellitus ([facility] [facility] 04/06/2019   Monoclonal gammopathy 04/06/2019   Osteoporosis with pathological fracture of forearm 03/21/2019   Multiple closed fractures of ribs of left side 03/21/2019   Primary open angle glaucoma (POAG) of left eye, mild stage 02/04/2019   Primary open angle glaucoma (POAG) of right eye, moderate stage 02/04/2019   Preventative health care 04/01/2017   Prostate cancer ([facility] 02/15/2017   Fuchs' corneal dystrophy 08/18/2016   BPH with urinary obstruction 12/13/2012  CAD (coronary artery disease) 04/09/2011   Gastritis and gastroduodenitis 05/25/2010   COLONIC POLYPS, ADENOMATOUS, HX OF 04/20/2010   ANEMIA 10/27/2009   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, without long-term current use of insulin ([place]) 07/04/2006   Hyperlipidemia 07/04/2006   Essential hypertension 07/04/2006    Past Medical History:  Diagnosis Date   CAD (coronary artery disease)    LHC 03/25/11 by Dr. Burt Knack:  pLAD 99%,  oCFX 20-30%, pOM1 40%, dAVCFX 20-30%.  EF was normal on nuclear study.  He was treated with a Promus DES to his pLAD.    Colon polyps 1996   villous adenoma   DM type 2 (diabetes mellitus, type 2) ([place]) 2002   Fuchs' corneal dystrophy    Hyperlipidemia    Hypertension    Macular degeneration    posterior vitreous vitreous detachment   Prostate cancer ([place]) dx 2018    Family History  Problem Relation Age of Onset   Stomach cancer Mother 37   Coronary artery disease Father 69       deceased   Healthy Son    Healthy Son    Healthy Son    Past Surgical History:  Procedure Laterality Date   CARDIAC CATHETERIZATION  03/25/2011   cataract Bilateral 2010   corenea implants  june and sept 2018   [name]   CORONARY STENT PLACEMENT  03/25/2011   RADIOACTIVE SEED IMPLANT N/A 05/06/2017   Procedure: RADIOACTIVE SEED IMPLANT/BRACHYTHERAPY IMPLANT;  Surgeon: Kathie Rhodes, MD;  Location: [facility];  Service: Urology;  Laterality: N/A;   SPACE OAR INSTILLATION N/A 05/06/2017   Procedure: SPACE OAR INSTILLATION;  Surgeon: Kathie Rhodes, MD;  Location: [facility];  Service: Urology;  Laterality: N/A;   Social History   Social History Narrative   Not on file   Immunization History  Administered Date(s) Administered   Fluad Quad(high Dose 65+) 04/06/2019, 04/28/2020, 08/03/2021   Influenza Split 03/05/2011, 04/24/2012   Influenza Whole 02/27/2008, 03/04/2010   Influenza, High Dose Seasonal PF 04/01/2017, 03/14/2018   Influenza,inj,quad, With Preservative 03/07/2013   Influenza-Unspecified 03/08/2015   PFIZER(Purple Top)SARS-COV-2 Vaccination 08/13/2019, 09/18/2019, 03/21/2020   Pneumococcal Conjugate-13 08/26/2014   Pneumococcal Polysaccharide-23 02/27/2008, 07/31/2020     Objective: Vital Signs: There were no vitals taken for this visit.   Physical Exam   Musculoskeletal Exam: ***  CDAI Exam: CDAI Score: -- Patient Global: --;  Provider Global: -- Swollen: --; Tender: -- Joint Exam 05/26/2022   No joint exam has been documented for this visit   There is currently no information documented on the homunculus. Go to the Rheumatology activity and complete the homunculus joint exam.  Investigation: No additional findings.  Imaging: DG Bone Survey Met  Result Date: 05/05/2022 CLINICAL DATA:  Myeloma. EXAM: METASTATIC BONE SURVEY COMPARISON:  Head CT 07/27/2021.  Bone survey 05/05/2021. FINDINGS: Standard imaging of the axial and appendicular skeleton performed. Diffuse osteopenia. Very tiny lucencies noted in the skull. Myeloma could present in this fashion. Diffuse degenerative changes. No other focal bony abnormalities identified. Stable calcific changes noted over the submandibular region on lateral view of the neck. These could represent salivary gland calcifications. Prostate seeds noted. Diffuse atherosclerotic vascular calcification. IMPRESSION: Diffuse osteopenia. Very tiny lucencies noted the skull. Mild lumbar could present in this fashion. Electronically Signed   By: Marcello Moores  Register M.D.   On: 05/05/2022 08:00    Recent Labs: Lab Results  Component Value Date   WBC 9.3 04/26/2022  HGB 15.1 04/26/2022   PLT 245 04/26/2022   NA 135 04/26/2022   K 4.1 04/26/2022   CL 98 04/26/2022   CO2 30 04/26/2022   GLUCOSE 375 (H) 04/26/2022   BUN 24 (H) 04/26/2022   CREATININE 1.45 (H) 04/26/2022   BILITOT 0.7 04/26/2022   ALKPHOS 109 04/26/2022   AST 15 04/26/2022   ALT 16 04/26/2022   PROT 7.6 04/26/2022   ALBUMIN 4.4 04/26/2022   CALCIUM 9.7 04/26/2022   GFRAA 45 (L) 10/26/2019    Speciality Comments: No specialty comments available.  Procedures:  No procedures performed Allergies: Patient has no known allergies.   Assessment / Plan:     Visit Diagnoses: No diagnosis found.  ***  Orders: No orders of the defined types were placed in this encounter.  No orders of the defined types were placed  in this encounter.    Follow-Up Instructions: No follow-ups on file.   Collier Salina, MD  Note - This record has been created using [employer].  Chart creation errors have been sought, but may not always  have been located. Such creation errors do not reflect on  the standard of medical care.

## 2022-05-26 ENCOUNTER — Ambulatory Visit: Payer: Medicare HMO | Attending: Internal Medicine | Admitting: Internal Medicine

## 2022-06-02 ENCOUNTER — Ambulatory Visit: Payer: Medicare HMO

## 2022-06-22 ENCOUNTER — Telehealth: Payer: Self-pay | Admitting: Family Medicine

## 2022-06-22 ENCOUNTER — Other Ambulatory Visit: Payer: Self-pay

## 2022-06-22 MED ORDER — TRUE METRIX BLOOD GLUCOSE TEST VI STRP: ORAL_STRIP | 3 refills | Status: DC

## 2022-06-22 NOTE — Telephone Encounter (Signed)
Prescription Request  06/22/2022  Is this a "Controlled Substance" medicine? No  LOV: 05/11/2022  What is the name of the medication or equipment?   glucose blood (TRUE METRIX BLOOD GLUCOSE TEST) test strip [[id number]]   Have you contacted your pharmacy to request a refill? No   Which pharmacy would you like this sent to?   [id number] [address]Phone: [phone]: [phone]  Patient notified that their request is being sent to the clinical staff for review and that they should receive a response within 2 business days.   Please advise at Mobile [phone](mobile)

## 2022-06-22 NOTE — Telephone Encounter (Signed)
Refill sent.

## 2022-06-26 ENCOUNTER — Emergency Department (HOSPITAL_BASED_OUTPATIENT_CLINIC_OR_DEPARTMENT_OTHER)
Admission: EM | Admit: 2022-06-26 | Discharge: 2022-06-26 | Disposition: A | Discharge status: Home / Self Care | Payer: Medicare HMO | Attending: Emergency Medicine | Admitting: Emergency Medicine

## 2022-06-26 ENCOUNTER — Encounter (HOSPITAL_BASED_OUTPATIENT_CLINIC_OR_DEPARTMENT_OTHER): Payer: Self-pay | Admitting: Emergency Medicine

## 2022-06-26 ENCOUNTER — Emergency Department (HOSPITAL_BASED_OUTPATIENT_CLINIC_OR_DEPARTMENT_OTHER): Payer: Medicare HMO

## 2022-06-26 ENCOUNTER — Other Ambulatory Visit: Payer: Self-pay

## 2022-06-26 DIAGNOSIS — W010XXA Fall on same level from slipping, tripping and stumbling without subsequent striking against object, initial encounter: Secondary | ICD-10-CM | POA: Insufficient documentation

## 2022-06-26 DIAGNOSIS — I129 Hypertensive chronic kidney disease with stage 1 through stage 4 chronic kidney disease, or unspecified chronic kidney disease: Secondary | ICD-10-CM | POA: Diagnosis not present

## 2022-06-26 DIAGNOSIS — Z79899 Other long term (current) drug therapy: Secondary | ICD-10-CM | POA: Insufficient documentation

## 2022-06-26 DIAGNOSIS — Z7982 Long term (current) use of aspirin: Secondary | ICD-10-CM | POA: Diagnosis not present

## 2022-06-26 DIAGNOSIS — S39012A Strain of muscle, fascia and tendon of lower back, initial encounter: Secondary | ICD-10-CM

## 2022-06-26 DIAGNOSIS — Z8546 Personal history of malignant neoplasm of prostate: Secondary | ICD-10-CM | POA: Insufficient documentation

## 2022-06-26 DIAGNOSIS — Z87891 Personal history of nicotine dependence: Secondary | ICD-10-CM | POA: Insufficient documentation

## 2022-06-26 DIAGNOSIS — E1122 Type 2 diabetes mellitus with diabetic chronic kidney disease: Secondary | ICD-10-CM | POA: Insufficient documentation

## 2022-06-26 DIAGNOSIS — N1832 Chronic kidney disease, stage 3b: Secondary | ICD-10-CM | POA: Insufficient documentation

## 2022-06-26 DIAGNOSIS — M545 Low back pain, unspecified: Secondary | ICD-10-CM | POA: Diagnosis not present

## 2022-06-26 DIAGNOSIS — I2510 Atherosclerotic heart disease of native coronary artery without angina pectoris: Secondary | ICD-10-CM | POA: Insufficient documentation

## 2022-06-26 DIAGNOSIS — Z043 Encounter for examination and observation following other accident: Secondary | ICD-10-CM | POA: Diagnosis not present

## 2022-06-26 DIAGNOSIS — E1142 Type 2 diabetes mellitus with diabetic polyneuropathy: Secondary | ICD-10-CM | POA: Diagnosis not present

## 2022-06-26 DIAGNOSIS — Z7984 Long term (current) use of oral hypoglycemic drugs: Secondary | ICD-10-CM | POA: Diagnosis not present

## 2022-06-26 DIAGNOSIS — S3992XA Unspecified injury of lower back, initial encounter: Secondary | ICD-10-CM | POA: Diagnosis present

## 2022-06-26 MED ORDER — ACETAMINOPHEN 500 MG PO TABS
1000.0000 mg | ORAL_TABLET | Freq: Once | ORAL | Status: AC
  Administered 2022-06-26: 1000 mg via ORAL
  Filled 2022-06-26: qty 2

## 2022-06-26 MED ORDER — ACETAMINOPHEN 500 MG PO TABS: 1000.0000 mg | ORAL_TABLET | Freq: Four times a day (QID) | ORAL | 0 refills | Status: AC | PRN

## 2022-06-26 NOTE — Discharge Instructions (Signed)
We evaluated you in the emergency department for your back pain.  Your x-rays did not show any dangerous fractures.  Your symptoms are likely due to a muscle sprain.  Please take 1000 mg of Tylenol every 6 hours for pain at home.  You can also try heat therapy and cold therapy.  Please follow-up closely with your primary doctor.  I have attached some back exercises and stretches which you can try at home for your symptoms.  Please return to the emergency department if you develop any weakness, incontinence, numbness or tingling, inability to walk, severe worsening pain, fevers or chills, or any other concerning symptoms.

## 2022-06-26 NOTE — ED Triage Notes (Signed)
[place] a week and half a go,   hurt back , has some bruising, OTC meds not helping

## 2022-06-26 NOTE — ED Provider Notes (Signed)
[facility] Provider Note  CSN: [id number] Arrival date & time: 06/26/22 1316  Chief Complaint(s) Back Pain  HPI [name] is a 89 y.o. male with history of coronary artery disease, diabetes, hypertension, hyperlipidemia presenting to the emergency department with low back pain.  Patient reports that he fell around 1-1/2 weeks ago while gardening, tripped and landed on his bottom.  He reports right-sided low back pain.  No numbness or tingling.  No weakness.  No bowel or bladder incontinence.  Symptoms are constant.  He was taking something at home but he is not sure what it was.  He has also been using heat packs and ice packs.  He is primarily coming to the emergency department because he wanted to make sure that it was not anything dangerous.  He reports that he is still very active.  Denies hitting his head or any other injuries.   Past Medical History Past Medical History:  Diagnosis Date   CAD (coronary artery disease)    LHC 03/25/11 by Dr. Burt Knack:  pLAD 99%, oCFX 20-30%, pOM1 40%, dAVCFX 20-30%.  EF was normal on nuclear study.  He was treated with a Promus DES to his pLAD.    Colon polyps 1996   villous adenoma   DM type 2 (diabetes mellitus, type 2) ([place]) 2002   Fuchs' corneal dystrophy    Hyperlipidemia    Hypertension    Macular degeneration    posterior vitreous vitreous detachment   Prostate cancer ([place]) dx 2018   Patient Active Problem List   Diagnosis Date Noted   Type 2 diabetes mellitus with hyperglycemia, without long-term current use of insulin ([place]) 11/10/2021   Vitamin D deficiency 01/28/2021   Diabetes mellitus ([place]) 04/15/2020   Type 2 diabetes mellitus with stage 3b chronic kidney disease, without long-term current use of insulin ([place]) 02/06/2020   Type 2 diabetes mellitus with diabetic polyneuropathy, without long-term current use of insulin ([place]) 02/06/2020   Uncontrolled type 2 diabetes mellitus with  hyperglycemia ([place]) 04/06/2019   Hyperlipidemia associated with type 2 diabetes mellitus ([place]) 04/06/2019   Monoclonal gammopathy 04/06/2019   Osteoporosis with pathological fracture of forearm 03/21/2019   Multiple closed fractures of ribs of left side 03/21/2019   Primary open angle glaucoma (POAG) of left eye, mild stage 02/04/2019   Primary open angle glaucoma (POAG) of right eye, moderate stage 02/04/2019   Preventative health care 04/01/2017   Prostate cancer ([place]) 02/15/2017   Fuchs' corneal dystrophy 08/18/2016   BPH with urinary obstruction 12/13/2012   CAD (coronary artery disease) 04/09/2011   Gastritis and gastroduodenitis 05/25/2010   COLONIC POLYPS, ADENOMATOUS, HX OF 04/20/2010   ANEMIA 10/27/2009   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, without long-term current use of insulin ([place]) 07/04/2006   Hyperlipidemia 07/04/2006   Essential hypertension 07/04/2006   Home Medication(s) Prior to Admission medications   Medication Sig Start Date End Date Taking? Authorizing Provider  acetaminophen (TYLENOL) 500 MG tablet Take 2 tablets (1,000 mg total) by mouth every 6 (six) hours as needed. 06/26/22  Yes Cristie Hem, MD  Alcohol Swabs PADS Use as directed once a day 11/10/20   Carollee Herter, Alferd Apa, DO  alendronate (FOSAMAX) 70 MG tablet TAKE 1 TABLET ONCE WEEKLY *TAKE WITH A FULL GLASS OF WATER ON AN EMPTY STOMACH* 05/16/22   Rice, Resa Miner, MD  amLODipine (NORVASC) 10 MG tablet Take 1 tablet (10 mg total) by mouth daily.  05/11/22   [name], DO  aspirin 81 MG tablet Take 81 mg by mouth daily.    [provider]  atorvastatin (LIPITOR) 20 MG tablet TAKE 1 TABLET (20 MG TOTAL) BY MOUTH DAILY FOR CHOLESTEROL 03/22/22   Carollee Herter, Alferd Apa, DO  Blood Glucose Calibration (TRUE METRIX LEVEL 1) Low SOLN USE AS DIRECTED 03/19/20   [name], Melanie Crazier, MD  Blood Glucose Calibration (TRUE METRIX LEVEL 3) High SOLN Use to check  controls on glucometer strips every 30 days or with each new bottle of strips (whichever comes first). 01/24/20   Roma Schanz R, DO  Blood Glucose Monitoring Suppl (TRUE METRIX METER) w/Device KIT USE AS DIRECTED ONCE A DAY 08/14/20   [name], Melanie Crazier, MD  brimonidine-timolol (COMBIGAN) 0.2-0.5 % ophthalmic solution Place 1 drop into both eyes 2 (two) times daily.    [provider]  COLLAGEN PO Take by mouth. 1 scoop daily    [provider]  empagliflozin (JARDIANCE) 25 MG TABS tablet Take 1 tablet (25 mg total) by mouth daily before breakfast. 11/10/21   [name], Melanie Crazier, MD  fish oil-omega-3 fatty acids 1000 MG capsule Take 2 g by mouth daily.    [provider]  glimepiride (AMARYL) 2 MG tablet TAKE 1 TABLET EVERY DAY BEFORE BREAKFAST 03/02/22   [name], Melanie Crazier, MD  glucose blood (TRUE METRIX BLOOD GLUCOSE TEST) test strip Use to check blood glucose once a day (type 2 DM - E 11.65) 06/22/22   Carollee Herter, Alferd Apa, DO  losartan-hydrochlorothiazide (HYZAAR) 100-25 MG tablet TAKE 1 TABLET BY MOUTH DAILY FOR BLOOD PRESSURE AND KIDNEY PROTECTION 03/22/22   [name], DO  metoprolol succinate (TOPROL-XL) 50 MG 24 hr tablet Take 1 tablet (50 mg total) by mouth daily. Take with or immediately following a meal 05/12/22   Carollee Herter, Alferd Apa, DO  Multiple Vitamin (MULTIVITAMIN) tablet Take 1 tablet by mouth daily.    [provider]  tamsulosin (FLOMAX) 0.4 MG CAPS capsule Take 1 capsule (0.4 mg total) by mouth daily after supper. 05/12/22   Roma Schanz R, DO  TRUEplus Lancets 33G MISC Use to check blood sugar once a day.  DX  E11.9 11/10/20   [name], DO                                                                                                                                    Past Surgical History Past Surgical History:  Procedure Laterality Date   CARDIAC CATHETERIZATION  03/25/2011    cataract Bilateral 2010   corenea implants  june and sept 2018   [name]   CORONARY STENT PLACEMENT  03/25/2011   RADIOACTIVE SEED IMPLANT N/A 05/06/2017   Procedure: RADIOACTIVE SEED IMPLANT/BRACHYTHERAPY IMPLANT;  Surgeon: Kathie Rhodes, MD;  Location: [facility];  Service: Urology;  Laterality: N/A;  SPACE OAR INSTILLATION N/A 05/06/2017   Procedure: SPACE OAR INSTILLATION;  Surgeon: Kathie Rhodes, MD;  Location: [facility];  Service: Urology;  Laterality: N/A;   Family History Family History  Problem Relation Age of Onset   Stomach cancer Mother 50   Coronary artery disease Father 83       deceased   Healthy Son    Healthy Son    Healthy Son     Social History Social History   Tobacco Use   Smoking status: Former    Packs/day: 0.25    Years: 3.00    Total pack years: 0.75    Types: Cigarettes    Quit date: 06/07/1958    Years since quitting: 64.0   Smokeless tobacco: Never  Vaping Use   Vaping Use: Never used  Substance Use Topics   Alcohol use: Not Currently   Drug use: No   Allergies Patient has no known allergies.  Review of Systems Review of Systems  All other systems reviewed and are negative.   Physical Exam Vital Signs  I have reviewed the triage vital signs BP (!) 160/82 (BP Location: Right Arm)   Pulse 78   Temp 98 F (36.7 C) (Oral)   Resp 16   Ht '5\' 4"'$  (1.626 m)   Wt 56.7 kg   SpO2 96%   BMI 21.46 kg/m  Physical Exam Vitals and nursing note reviewed.  Constitutional:      General: He is not in acute distress.    Appearance: Normal appearance.  HENT:     Head: Atraumatic.     Mouth/Throat:     Mouth: Mucous membranes are moist.  Eyes:     Conjunctiva/sclera: Conjunctivae normal.  Cardiovascular:     Rate and Rhythm: Normal rate and regular rhythm.  Pulmonary:     Effort: Pulmonary effort is normal. No respiratory distress.     Breath sounds: Normal breath sounds.  Abdominal:      General: Abdomen is flat.     Palpations: Abdomen is soft.     Tenderness: There is no abdominal tenderness.  Musculoskeletal:     Right lower leg: No edema.     Left lower leg: No edema.     Comments: No midline C, T, L-spine tenderness.  Mild right lumbar paraspinal tenderness which reproduces the patient's pain.  No chest wall tenderness or crepitus.  Full painless range of motion at the bilateral upper extremities including the shoulders, elbows, wrists, hand and fingers, and in the bilateral lower extremities including the hips, knees, ankle, toes.  No focal bony tenderness, injury or deformity.  Skin:    General: Skin is warm and dry.     Capillary Refill: Capillary refill takes less than 2 seconds.  Neurological:     Mental Status: He is alert and oriented to person, place, and time. Mental status is at baseline.     Comments: Strength 5 out of 5 in the bilateral lower extremities.  Normal gait.  2+ patellar reflexes bilaterally.  Psychiatric:        Mood and Affect: Mood normal.        Behavior: Behavior normal.     ED Results and Treatments Labs (all labs ordered are listed, but only abnormal results are displayed) Labs Reviewed - No data to display  Radiology DG Ribs Unilateral W/Chest Right  Result Date: 06/26/2022 CLINICAL DATA:  Status post fall a week ago. EXAM: RIGHT RIBS AND CHEST - 3+ VIEW COMPARISON:  07/24/2020 FINDINGS: No fracture or other bone lesions are seen involving the ribs. There is no evidence of pneumothorax or pleural effusion. Both lungs are clear. Heart size and mediastinal contours are within normal limits. IMPRESSION: Negative. Electronically Signed   By: Kathreen Devoid M.D.   On: 06/26/2022 14:09   DG Lumbar Spine Complete  Result Date: 06/26/2022 CLINICAL DATA:  Fall.  Back pain EXAM: LUMBAR SPINE - COMPLETE 4+ VIEW COMPARISON:   05/05/2021 FINDINGS: Diffuse degenerative disc and facet disease. Normal alignment. No fracture. SI joints symmetric and unremarkable. Aortic atherosclerosis. No visible aneurysm. IMPRESSION: Degenerative disc and facet disease.  No acute bony abnormality. Electronically Signed   By: Rolm Baptise M.D.   On: 06/26/2022 14:09    Pertinent labs & imaging results that were available during my care of the patient were reviewed by me and considered in my medical decision making (see MDM for details).  Medications Ordered in ED Medications  acetaminophen (TYLENOL) tablet 1,000 mg (has no administration in time range)                                                                                                                                     Procedures Procedures  (including critical care time)  Medical Decision Making / ED Course   MDM:  89 year old male presenting to the emergency department with low back pain.  Patient well-appearing, physical exam notable for some mild lumbar paraspinal tenderness with no midline tenderness.  Normal strength and reflexes in the bilateral lower extremities.  Suspect lumbar strain.  Recommended Tylenol, home physical therapy and close primary physician follow-up.  No red flag symptoms to suggest occult spinal cord infection or compression, cauda equina syndrome.  X-ray of the low back without evidence of acute fracture and patient has no midline tenderness to suggest occult fracture.  X-ray of the ribs on the right was also obtained in triage although clinically patient denies any rib pain or chest pain to me and has equal lung sounds, x-ray is negative.  Will discharge patient to home. All questions answered. Patient comfortable with plan of discharge. Return precautions discussed with patient and specified on the after visit summary.       Imaging Studies ordered: I ordered imaging studies including XR lumbar spine On my interpretation imaging  demonstrates no fracture I independently visualized and interpreted imaging. I agree with the radiologist interpretation   Medicines ordered and prescription drug management: Meds ordered this encounter  Medications   acetaminophen (TYLENOL) tablet 1,000 mg   acetaminophen (TYLENOL) 500 MG tablet    Sig: Take 2 tablets (1,000 mg total) by mouth every 6 (six) hours as needed.    Dispense:  60 tablet    Refill:  0    -  I have reviewed the patients home medicines and have made adjustments as needed   Social Determinants of Health:  Diagnosis or treatment significantly limited by social determinants of health: lives alone   Reevaluation: After the interventions noted above, I reevaluated the patient and found that they have improved  Co morbidities that complicate the patient evaluation  Past Medical History:  Diagnosis Date   CAD (coronary artery disease)    LHC 03/25/11 by Dr. Burt Knack:  pLAD 99%, oCFX 20-30%, pOM1 40%, dAVCFX 20-30%.  EF was normal on nuclear study.  He was treated with a Promus DES to his pLAD.    Colon polyps 1996   villous adenoma   DM type 2 (diabetes mellitus, type 2) ([place]) 2002   Fuchs' corneal dystrophy    Hyperlipidemia    Hypertension    Macular degeneration    posterior vitreous vitreous detachment   Prostate cancer ([place]) dx 2018      Dispostion: Disposition decision including need for hospitalization was considered, and patient discharged from emergency department.    Final Clinical Impression(s) / ED Diagnoses Final diagnoses:  Strain of lumbar region, initial encounter     This chart was dictated using voice recognition software.  Despite best efforts to proofread,  errors can occur which can change the documentation meaning.    Cristie Hem, MD 06/26/22 [id number]

## 2022-06-28 ENCOUNTER — Telehealth: Payer: Self-pay

## 2022-06-28 NOTE — Telephone Encounter (Signed)
Pt seen at ED 

## 2022-06-28 NOTE — Telephone Encounter (Signed)
Nurse Assessment Nurse: Fritz Pickerel, RN, Steffanie Dunn Date/Time Eilene Ghazi Time): 06/26/2022 12:38:01 PM Confirm and document reason for call. If symptomatic, describe symptoms. ---Caller states friend fell and hurt his back 2 days ago. C/o severe discomfort. Does the patient have any new or worsening symptoms? ---Yes Will a triage be completed? ---Yes Related visit to physician within the last 2 weeks? ---No Does the PT have any chronic conditions? (i.e. diabetes, asthma, this includes High risk factors for pregnancy, etc.) ---Yes List chronic conditions. ---htn DM Is this a behavioral health or substance abuse call? ---No Guidelines Guideline Title Affirmed Question Affirmed Notes Nurse Date/Time (Eastern Time) Back Injury Sounds like a serious injury to the triager Fritz Pickerel, RN, Steffanie Dunn 06/26/2022 12:40:26 PM Disp. Time Eilene Ghazi Time) Disposition Final User 06/26/2022 12:32:19 PM Attempt made - message left Harvie Junior, Steffanie Dunn 06/26/2022 12:46:33 PM Go to ED Now Yes Fritz Pickerel, RN, Steffanie Dunn PLEASE NOTE: All timestamps contained within this report are represented as [place] Standard Time. CONFIDENTIALTY NOTICE: This fax transmission is intended only for the addressee. It contains information that is legally privileged, confidential or otherwise protected from use or disclosure. If you are not the intended recipient, you are strictly prohibited from reviewing, disclosing, copying using or disseminating any of this information or taking any action in reliance on or regarding this information. If you have received this fax in error, please notify us immediately by telephone so that we can arrange for its return to [place]. Phone: [phone], Toll-Free: [phone], Fax: [phone] Page: 2 of 2 Call Id: [id number] Final Disposition 06/26/2022 12:46:33 PM Go to ED Now Yes Fritz Pickerel, RN, Steffanie Dunn Caller Disagree/Comply Comply Caller Understands Yes PreDisposition Call Doctor Care Advice Given Per Guideline GO TO ED NOW:  * You need to be seen in the Emergency Department. ANOTHER ADULT SHOULD DRIVE: * It is better and safer if another adult drives instead of you. Comments User: Marvia Pickles, RN Date/Time Eilene Ghazi Time): 06/26/2022 12:42:20 PM Slipped and fell - landed on right buttock Referrals GO TO FACILITY UNDECIDED

## 2022-07-02 ENCOUNTER — Other Ambulatory Visit: Payer: Self-pay | Admitting: Internal Medicine

## 2022-07-02 DIAGNOSIS — M80032D Age-related osteoporosis with current pathological fracture, left forearm, subsequent encounter for fracture with routine healing: Secondary | ICD-10-CM

## 2022-07-22 ENCOUNTER — Other Ambulatory Visit: Payer: Self-pay | Admitting: Family Medicine

## 2022-07-22 DIAGNOSIS — I10 Essential (primary) hypertension: Secondary | ICD-10-CM

## 2022-08-13 ENCOUNTER — Encounter: Payer: Self-pay | Admitting: Family Medicine

## 2022-08-13 ENCOUNTER — Ambulatory Visit (INDEPENDENT_AMBULATORY_CARE_PROVIDER_SITE_OTHER): Payer: Medicare HMO | Admitting: Family Medicine

## 2022-08-13 VITALS — BP 120/80 | HR 66 | Temp 97.8°F | Resp 18 | Ht 64.0 in | Wt 126.4 lb

## 2022-08-13 DIAGNOSIS — E1122 Type 2 diabetes mellitus with diabetic chronic kidney disease: Secondary | ICD-10-CM

## 2022-08-13 DIAGNOSIS — C9000 Multiple myeloma not having achieved remission: Secondary | ICD-10-CM | POA: Insufficient documentation

## 2022-08-13 DIAGNOSIS — E1165 Type 2 diabetes mellitus with hyperglycemia: Secondary | ICD-10-CM

## 2022-08-13 DIAGNOSIS — E1136 Type 2 diabetes mellitus with diabetic cataract: Secondary | ICD-10-CM

## 2022-08-13 DIAGNOSIS — I10 Essential (primary) hypertension: Secondary | ICD-10-CM

## 2022-08-13 DIAGNOSIS — I6523 Occlusion and stenosis of bilateral carotid arteries: Secondary | ICD-10-CM | POA: Diagnosis not present

## 2022-08-13 DIAGNOSIS — E785 Hyperlipidemia, unspecified: Secondary | ICD-10-CM | POA: Diagnosis not present

## 2022-08-13 DIAGNOSIS — D8989 Other specified disorders involving the immune mechanism, not elsewhere classified: Secondary | ICD-10-CM

## 2022-08-13 DIAGNOSIS — C61 Malignant neoplasm of prostate: Secondary | ICD-10-CM

## 2022-08-13 DIAGNOSIS — Z0000 Encounter for general adult medical examination without abnormal findings: Secondary | ICD-10-CM

## 2022-08-13 DIAGNOSIS — N1832 Chronic kidney disease, stage 3b: Secondary | ICD-10-CM

## 2022-08-13 DIAGNOSIS — R413 Other amnesia: Secondary | ICD-10-CM | POA: Diagnosis not present

## 2022-08-13 DIAGNOSIS — E1151 Type 2 diabetes mellitus with diabetic peripheral angiopathy without gangrene: Secondary | ICD-10-CM

## 2022-08-13 DIAGNOSIS — E1169 Type 2 diabetes mellitus with other specified complication: Secondary | ICD-10-CM | POA: Diagnosis not present

## 2022-08-13 HISTORY — DX: Type 2 diabetes mellitus with diabetic peripheral angiopathy without gangrene: E11.51

## 2022-08-13 HISTORY — DX: Other specified disorders involving the immune mechanism, not elsewhere classified: D89.89

## 2022-08-13 HISTORY — DX: Multiple myeloma not having achieved remission: C90.00

## 2022-08-13 LAB — COMPREHENSIVE METABOLIC PANEL
ALT: 14 U/L (ref 0–53)
AST: 14 U/L (ref 0–37)
Albumin: 3.3 g/dL — ABNORMAL LOW (ref 3.5–5.2)
Alkaline Phosphatase: 122 U/L — ABNORMAL HIGH (ref 39–117)
BUN: 13 mg/dL (ref 6–23)
CO2: 31 mEq/L (ref 19–32)
Calcium: 9 mg/dL (ref 8.4–10.5)
Chloride: 101 mEq/L (ref 96–112)
Creatinine, Ser: 1.15 mg/dL (ref 0.40–1.50)
GFR: 56.82 mL/min — ABNORMAL LOW (ref >60.00)
Glucose, Bld: 242 mg/dL — ABNORMAL HIGH (ref 70–99)
Potassium: 4.2 mEq/L (ref 3.5–5.1)
Sodium: 140 mEq/L (ref 135–145)
Total Bilirubin: 0.8 mg/dL (ref 0.2–1.2)
Total Protein: 5.7 g/dL — ABNORMAL LOW (ref 6.0–8.3)

## 2022-08-13 LAB — CBC WITH DIFFERENTIAL/PLATELET
Basophils Absolute: 0.1 10*3/uL (ref 0.0–0.1)
Basophils Relative: 0.6 % (ref 0.0–3.0)
Eosinophils Absolute: 0.2 10*3/uL (ref 0.0–0.7)
Eosinophils Relative: 2.1 % (ref 0.0–5.0)
HCT: 42.4 % (ref 39.0–52.0)
Hemoglobin: 14.3 g/dL (ref 13.0–17.0)
Lymphocytes Relative: 33.4 % (ref 12.0–46.0)
Lymphs Abs: 3.1 10*3/uL (ref 0.7–4.0)
MCHC: 33.8 g/dL (ref 30.0–36.0)
MCV: 89.3 fl (ref 78.0–100.0)
Monocytes Absolute: 0.8 10*3/uL (ref 0.1–1.0)
Monocytes Relative: 8.1 % (ref 3.0–12.0)
Neutro Abs: 5.2 10*3/uL (ref 1.4–7.7)
Neutrophils Relative %: 55.8 % (ref 43.0–77.0)
Platelets: 223 10*3/uL (ref 150.0–400.0)
RBC: 4.75 Mil/uL (ref 4.22–5.81)
RDW: 13.6 % (ref 11.5–15.5)
WBC: 9.4 10*3/uL (ref 4.0–10.5)

## 2022-08-13 LAB — LIPID PANEL
Cholesterol: 189 mg/dL (ref 0–200)
HDL: 48.2 mg/dL (ref >39.00)
LDL Cholesterol: 117 mg/dL — ABNORMAL HIGH (ref 0–99)
NonHDL: 141.21
Total CHOL/HDL Ratio: 4
Triglycerides: 122 mg/dL (ref 0.0–149.0)
VLDL: 24.4 mg/dL (ref 0.0–40.0)

## 2022-08-13 LAB — HEMOGLOBIN A1C: Hgb A1c MFr Bld: 13.7 % — ABNORMAL HIGH (ref 4.6–6.5)

## 2022-08-13 LAB — TSH: TSH: 3.58 u[IU]/mL (ref 0.35–5.50)

## 2022-08-13 LAB — VITAMIN B12: Vitamin B-12: 359 pg/mL (ref 211–911)

## 2022-08-13 LAB — PSA: PSA: 0.03 ng/mL — ABNORMAL LOW (ref 0.10–4.00)

## 2022-08-13 LAB — MICROALBUMIN / CREATININE URINE RATIO
Creatinine,U: 62.6 mg/dL
Microalb Creat Ratio: 516.6 mg/g — ABNORMAL HIGH (ref 0.0–30.0)
Microalb, Ur: 323.5 mg/dL — ABNORMAL HIGH (ref 0.0–1.9)

## 2022-08-13 NOTE — Assessment & Plan Note (Signed)
Encourage heart healthy diet such as MIND or DASH diet, increase exercise, avoid trans fats, simple carbohydrates and processed foods, consider a krill or fish or flaxseed oil cap daily.  °

## 2022-08-13 NOTE — Assessment & Plan Note (Signed)
hgba1c to be checked minimize simple carbs. Increase exercise as tolerated. Continue current meds 

## 2022-08-13 NOTE — Progress Notes (Signed)
Subjective:   By signing my name below, I, [name], attest that this documentation has been prepared under the direction and in the presence of Donald Held, DO 08/13/22   Patient ID: [name], male    DOB: [birth year], 89 y.o.   MRN: [id number]  Chief Complaint  Patient presents with   Annual Exam    Pt states not fasting     HPI Patient is in today for a comprehensive physical exam. He is overall doing well. He is accompanied by his son, [name].   He reports a fall where he tripped while in his back yard. When he fell he fell on his back and may have also hit his head. He bruised his back in 3 different spots. He denies any loss of consciousness. He went to the ED and reports his MRI was normal. After his fall he struggled with his balance for about a month and had to use a cane for a while. Since then, his balance has significantly improved.   His son reports gradual memory loss which has worsened after his fall. He has gotten lost while going to the dentist and ended up returning home. His son notes that in conversation he sometimes will repeat questions that were already asked. He reports difficulty remembering when his appointments are and details on his finances. He can remember his daily routine like visiting friends and going to [place].   He is currently living with one of his sons and enjoys running and gardening. He plans to participate in a senior race and will run an 100 meter dash. He stays active very regularly.   Last colonoscopy: 05/26/2010. Results were normal.   Last PSA: Lab Results  Component Value Date   PSA 0.02 (L) 08/03/2021   PSA 9.32 (H) 08/30/2016   PSA 3.74 02/28/2014   He is UTD on routine dental exam. He is UTD on routine vision exam.  Past Medical History:  Diagnosis Date   CAD (coronary artery disease)    LHC 03/25/11 by Dr. Burt Knack:  pLAD 99%, oCFX 20-30%, pOM1 40%, dAVCFX 20-30%.  EF was normal on nuclear study.  He was treated  with a Promus DES to his pLAD.    Colon polyps 1996   villous adenoma   DM type 2 (diabetes mellitus, type 2) ([place]) 2002   Fuchs' corneal dystrophy    Hyperlipidemia    Hypertension    Macular degeneration    posterior vitreous vitreous detachment   Prostate cancer ([place]) dx 2018    Past Surgical History:  Procedure Laterality Date   CARDIAC CATHETERIZATION  03/25/2011   cataract Bilateral 2010   corenea implants  june and sept 2018   [name]   CORONARY STENT PLACEMENT  03/25/2011   RADIOACTIVE SEED IMPLANT N/A 05/06/2017   Procedure: RADIOACTIVE SEED IMPLANT/BRACHYTHERAPY IMPLANT;  Surgeon: Kathie Rhodes, MD;  Location: [facility];  Service: Urology;  Laterality: N/A;   SPACE OAR INSTILLATION N/A 05/06/2017   Procedure: SPACE OAR INSTILLATION;  Surgeon: Kathie Rhodes, MD;  Location: [facility];  Service: Urology;  Laterality: N/A;    Family History  Problem Relation Age of Onset   Stomach cancer Mother 88   Coronary artery disease Father 25       deceased   Healthy Son    Healthy Son    Healthy Son     Social History   Socioeconomic History   Marital status: Widowed  Spouse name: Not on file   Number of children: Not on file   Years of education: Not on file   Highest education level: Not on file  Occupational History   Occupation: retired  Tobacco Use   Smoking status: Former    Packs/day: 0.25    Years: 3.00    Total pack years: 0.75    Types: Cigarettes    Quit date: 06/07/1958    Years since quitting: 64.2   Smokeless tobacco: Never  Vaping Use   Vaping Use: Never used  Substance and Sexual Activity   Alcohol use: Not Currently   Drug use: No   Sexual activity: Not Currently  Other Topics Concern   Not on file  Social History Narrative   Not on file   Social Determinants of Health   Financial Resource Strain: Low Risk  (06/23/2021)   Overall Financial Resource Strain (CARDIA)    Difficulty of Paying  Living Expenses: Not hard at all  Food Insecurity: No Food Insecurity (06/23/2021)   Hunger Vital Sign    Worried About Running Out of Food in the Last Year: Never true    [place] in the Last Year: Never true  Transportation Needs: No Transportation Needs (06/23/2021)   PRAPARE - Hydrologist (Medical): No    Lack of Transportation (Non-Medical): No  Physical Activity: Sufficiently Active (06/23/2021)   Exercise Vital Sign    Days of Exercise per Week: 7 days    Minutes of Exercise per Session: 30 min  Stress: No Stress Concern Present (09/15/2021)   [place]    Feeling of Stress : Not at all  Social Connections: Moderately Integrated (12/18/2020)   Social Connection and Isolation Panel [NHANES]    Frequency of Communication with Friends and Family: More than three times a week    Frequency of Social Gatherings with Friends and Family: Twice a week    Attends Religious Services: More than 4 times per year    Active Member of [employer] or Organizations: Yes    Attends Archivist Meetings: More than 4 times per year    Marital Status: Widowed  Intimate Partner Violence: Not At Risk (09/15/2021)   Humiliation, Afraid, Rape, and Kick questionnaire    Fear of Current or Ex-Partner: No    Emotionally Abused: No    Physically Abused: No    Sexually Abused: No    Outpatient Medications Prior to Visit  Medication Sig Dispense Refill   acetaminophen (TYLENOL) 500 MG tablet Take 2 tablets (1,000 mg total) by mouth every 6 (six) hours as needed. 60 tablet 0   Alcohol Swabs PADS Use as directed once a day 100 each 1   alendronate (FOSAMAX) 70 MG tablet TAKE 1 TABLET ONCE WEEKLY *TAKE WITH A FULL GLASS OF WATER ON AN EMPTY STOMACH* 12 tablet 0   amLODipine (NORVASC) 10 MG tablet Take 1 tablet (10 mg total) by mouth daily. 90 tablet 3   aspirin 81 MG tablet Take 81 mg by mouth daily.      atorvastatin (LIPITOR) 20 MG tablet TAKE 1 TABLET (20 MG TOTAL) BY MOUTH DAILY FOR CHOLESTEROL 90 tablet 3   Blood Glucose Calibration (TRUE METRIX LEVEL 1) Low SOLN USE AS DIRECTED 1 each 3   Blood Glucose Calibration (TRUE METRIX LEVEL 3) High SOLN Use to check controls on glucometer strips every 30 days or with each new bottle of  strips (whichever comes first). 3 each 1   Blood Glucose Monitoring Suppl (TRUE METRIX METER) w/Device KIT USE AS DIRECTED ONCE A DAY 1 kit 0   brimonidine-timolol (COMBIGAN) 0.2-0.5 % ophthalmic solution Place 1 drop into both eyes 2 (two) times daily.     COLLAGEN PO Take by mouth. 1 scoop daily     empagliflozin (JARDIANCE) 25 MG TABS tablet Take 1 tablet (25 mg total) by mouth daily before breakfast. 90 tablet 3   fish oil-omega-3 fatty acids 1000 MG capsule Take 2 g by mouth daily.     glimepiride (AMARYL) 2 MG tablet TAKE 1 TABLET EVERY DAY BEFORE BREAKFAST 90 tablet 1   glucose blood (TRUE METRIX BLOOD GLUCOSE TEST) test strip Use to check blood glucose once a day (type 2 DM - E 11.65) 100 strip 3   losartan-hydrochlorothiazide (HYZAAR) 100-25 MG tablet TAKE 1 TABLET BY MOUTH DAILY FOR BLOOD PRESSURE AND KIDNEY PROTECTION 90 tablet 3   metoprolol succinate (TOPROL-XL) 50 MG 24 hr tablet Take 1 tablet (50 mg total) by mouth daily. Take with or immediately following a meal 90 tablet 1   Multiple Vitamin (MULTIVITAMIN) tablet Take 1 tablet by mouth daily.     tamsulosin (FLOMAX) 0.4 MG CAPS capsule Take 1 capsule (0.4 mg total) by mouth daily after supper. 90 capsule 1   TRUEplus Lancets 33G MISC Use to check blood sugar once a day.  DX  E11.9 100 each 1   No facility-administered medications prior to visit.    No Known Allergies  Review of Systems  Musculoskeletal:  Positive for back pain (attributed to fall) and falls.  Psychiatric/Behavioral:  Positive for memory loss.        Objective:    Physical Exam Constitutional:      General: He is not in  acute distress.    Appearance: Normal appearance.  HENT:     Head: Normocephalic and atraumatic.     Right Ear: Tympanic membrane, ear canal and external ear normal.     Left Ear: Tympanic membrane, ear canal and external ear normal.  Eyes:     Extraocular Movements: Extraocular movements intact.     Pupils: Pupils are equal, round, and reactive to light.  Cardiovascular:     Rate and Rhythm: Normal rate and regular rhythm.     Heart sounds: Normal heart sounds. No murmur heard.    No gallop.  Pulmonary:     Effort: Pulmonary effort is normal. No respiratory distress.     Breath sounds: Normal breath sounds. No wheezing or rales.  Abdominal:     Palpations: Abdomen is soft.     Tenderness: There is no abdominal tenderness.  Skin:    General: Skin is warm and dry.  Neurological:     Mental Status: He is alert and oriented to person, place, and time.  Psychiatric:        Behavior: Behavior normal.        Judgment: Judgment normal.     BP 120/80 (BP Location: Left Arm, Patient Position: Sitting, Cuff Size: Normal)   Pulse 66   Temp 97.8 F (36.6 C) (Oral)   Resp 18   Ht '5\' 4"'$  (1.626 m)   Wt 126 lb 6.4 oz (57.3 kg)   SpO2 96%   BMI 21.70 kg/m  Wt Readings from Last 3 Encounters:  08/13/22 126 lb 6.4 oz (57.3 kg)  06/26/22 125 lb (56.7 kg)  05/11/22 134 lb 6.4 oz (61 kg)  Assessment & Plan:  Preventative health care Assessment & Plan: Ghm utd Check labs  See AVS  Health Maintenance Due  Topic Date Due   DTaP/Tdap/Td (1 - Tdap) Never done   Zoster Vaccines- Shingrix (1 of 2) Never done   OPHTHALMOLOGY EXAM  01/15/2021   COVID-19 Vaccine (4 - 2023-24 season) 02/05/2022   FOOT EXAM  08/03/2022   HEMOGLOBIN A1C  08/04/2022   Medicare Annual Wellness (AWV)  09/16/2022      Memory loss -     MR BRAIN WO CONTRAST; Future -     TSH -     Vitamin B12 -     Ambulatory referral to Neuropsychology  Primary hypertension -     Lipid panel -     CBC with  Differential/Platelet -     Comprehensive metabolic panel  Type 2 diabetes mellitus with hyperglycemia, without long-term current use of insulin (HCC) -     Comprehensive metabolic panel -     Hemoglobin A1c -     Microalbumin / creatinine urine ratio  Prostate cancer (HCC) -     PSA  Bilateral carotid artery stenosis  Hyperlipidemia associated with type 2 diabetes mellitus (HCC) Assessment & Plan: Encourage heart healthy diet such as MIND or DASH diet, increase exercise, avoid trans fats, simple carbohydrates and processed foods, consider a krill or fish or flaxseed oil cap daily.    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   minimize simple carbs. Increase exercise as tolerated. Continue current meds    Essential hypertension Assessment & Plan: Well controlled, no changes to meds. Encouraged heart healthy diet such as the DASH diet and exercise as tolerated.        I,Rachel Rivera,acting as a Education administrator for [employer], DO.,have documented all relevant documentation on the behalf of Donald Held, DO,as directed by  Donald Held, DO while in the presence of [place], DO, personally preformed the services described in this documentation.  All medical record entries made by the scribe were at my direction and in my presence.  I have reviewed the chart and discharge instructions (if applicable) and agree that the record reflects  my personal performance and is accurate and complete. 08/13/22   Donald Held, DO

## 2022-08-13 NOTE — Assessment & Plan Note (Signed)
Well controlled, no changes to meds. Encouraged heart healthy diet such as the DASH diet and exercise as tolerated.  °

## 2022-08-13 NOTE — Assessment & Plan Note (Signed)
Per oncology °

## 2022-08-13 NOTE — Assessment & Plan Note (Signed)
Ghm utd Check labs  See AVS  Health Maintenance Due  Topic Date Due   DTaP/Tdap/Td (1 - Tdap) Never done   Zoster Vaccines- Shingrix (1 of 2) Never done   OPHTHALMOLOGY EXAM  01/15/2021   COVID-19 Vaccine (4 - 2023-24 season) 02/05/2022   FOOT EXAM  08/03/2022   HEMOGLOBIN A1C  08/04/2022   Medicare Annual Wellness (AWV)  09/16/2022

## 2022-08-16 ENCOUNTER — Other Ambulatory Visit: Payer: Self-pay | Admitting: Family Medicine

## 2022-08-21 ENCOUNTER — Ambulatory Visit
Admission: RE | Admit: 2022-08-21 | Discharge: 2022-08-21 | Disposition: A | Discharge status: Home / Self Care | Payer: Medicare HMO | Source: Ambulatory Visit | Attending: Family Medicine | Admitting: Family Medicine

## 2022-08-21 DIAGNOSIS — R4182 Altered mental status, unspecified: Secondary | ICD-10-CM | POA: Diagnosis not present

## 2022-08-21 DIAGNOSIS — I6782 Cerebral ischemia: Secondary | ICD-10-CM | POA: Diagnosis not present

## 2022-08-21 DIAGNOSIS — R413 Other amnesia: Secondary | ICD-10-CM | POA: Diagnosis not present

## 2022-08-21 DIAGNOSIS — G319 Degenerative disease of nervous system, unspecified: Secondary | ICD-10-CM | POA: Diagnosis not present

## 2022-08-24 ENCOUNTER — Encounter: Payer: Self-pay | Admitting: Physician Assistant

## 2022-08-25 ENCOUNTER — Encounter: Payer: Self-pay | Admitting: Internal Medicine

## 2022-08-25 ENCOUNTER — Ambulatory Visit: Payer: Medicare HMO | Admitting: Internal Medicine

## 2022-08-25 VITALS — BP 134/88 | HR 61 | Ht 64.0 in | Wt 125.0 lb

## 2022-08-25 DIAGNOSIS — E1165 Type 2 diabetes mellitus with hyperglycemia: Secondary | ICD-10-CM | POA: Diagnosis not present

## 2022-08-25 DIAGNOSIS — E1122 Type 2 diabetes mellitus with diabetic chronic kidney disease: Secondary | ICD-10-CM | POA: Diagnosis not present

## 2022-08-25 DIAGNOSIS — N1832 Chronic kidney disease, stage 3b: Secondary | ICD-10-CM | POA: Diagnosis not present

## 2022-08-25 LAB — POCT GLUCOSE (DEVICE FOR HOME USE): Glucose Fasting, POC: 327 mg/dL — AB (ref 70–99)

## 2022-08-25 MED ORDER — GLIPIZIDE 5 MG PO TABS: 5.0000 mg | ORAL_TABLET | Freq: Two times a day (BID) | ORAL | 3 refills | Status: DC

## 2022-08-25 MED ORDER — EMPAGLIFLOZIN 25 MG PO TABS: 25.0000 mg | ORAL_TABLET | Freq: Every day | ORAL | 3 refills | Status: DC

## 2022-08-25 NOTE — Patient Instructions (Signed)
-   Stop Amaryl ( Glimepiride) - Start Glipizide 5 mg, 1 tablet Before Breakfast and 1 tablet Before Supper  - Restart Jardiance 25  mg , 1 tablet every morning       HOW TO TREAT LOW BLOOD SUGARS (Blood sugar LESS THAN 70 MG/DL) Please follow the RULE OF 15 for the treatment of hypoglycemia treatment (when your (blood sugars are less than 70 mg/dL)   STEP 1: Take 15 grams of carbohydrates when your blood sugar is low, which includes:  3-4 GLUCOSE TABS  OR 3-4 OZ OF JUICE OR REGULAR SODA OR ONE TUBE OF GLUCOSE GEL    STEP 2: RECHECK blood sugar in 15 MINUTES STEP 3: If your blood sugar is still low at the 15 minute recheck --> then, go back to STEP 1 and treat AGAIN with another 15 grams of carbohydrates.

## 2022-08-25 NOTE — Progress Notes (Signed)
Name: [name]  Age/ Sex: 89 y.o., male   MRN/ DOB: [id number], [birth year]     PCP: Carollee Herter, Alferd Apa, DO   Reason for Endocrinology Evaluation: Type 2 Diabetes Mellitus  Initial Endocrine Consultative Visit: 02/06/2020    PATIENT IDENTIFIER: [name] is a 89 y.o. male with a past medical history of T2DM, CAD and HTN. The patient has followed with Endocrinology clinic since 02/06/2020 for consultative assistance with management of his diabetes.  DIABETIC HISTORY:  [name] was diagnosed with DM in 2008, has been on Glimepiride for years, janumet started in 01/2020. His hemoglobin A1c has ranged from 6.7% in 2017, peaking at 9.6%in 2021.   On initial visit his A1c was 9.6 % , he was just started on Janumet and we did not make any changes and continued Glimepiride.  Discontinued Janumet and started Jardiance only due to low GFR 08/2021    SUBJECTIVE:   During the last visit (11/10/2021): A1c 9.2%.  Today (08/25/2022): [name] is here for a follow up on diabetes management.  He is accompanied by his son [name]. 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 Burt Knack:   pLAD 99%, oCFX 20-30%, pOM1 40%, dAVCFX 20-30%.  EF was normal on nuclear study.  He was treated with a Promus DES to his pLAD.    Colon polyps 1996   villous adenoma   DM type 2 (diabetes mellitus, type 2) ([place]) 2002   Fuchs' corneal dystrophy    Hyperlipidemia    Hypertension    Macular degeneration    posterior vitreous vitreous detachment   Prostate cancer ([place]) dx 2018   Past Surgical History:  Past Surgical History:  Procedure Laterality Date   CARDIAC CATHETERIZATION  03/25/2011   cataract Bilateral 2010   corenea implants  june and sept 2018   [name]   CORONARY STENT PLACEMENT  03/25/2011   RADIOACTIVE SEED IMPLANT N/A 05/06/2017   Procedure: RADIOACTIVE SEED IMPLANT/BRACHYTHERAPY IMPLANT;  Surgeon: Kathie Rhodes, MD;  Location: [place];  Service: Urology;  Laterality: N/A;   SPACE OAR INSTILLATION N/A 05/06/2017   Procedure: SPACE OAR INSTILLATION;  Surgeon: Kathie Rhodes, MD;  Location: [facility];  Service: Urology;  Laterality: N/A;   Social History:  reports that he quit smoking about 64 years ago. His smoking use included cigarettes. He has a 0.75 pack-year smoking history. He has never used smokeless tobacco. He reports that he does not currently use alcohol. He reports that he does not use drugs. Family History:  Family History  Problem Relation Age of Onset   Stomach cancer Mother 35   Coronary artery disease Father 55       deceased   Healthy Son    Healthy Son    Healthy Son      HOME MEDICATIONS: Allergies as of 08/25/2022   No Known Allergies      Medication List        Accurate as of August 25, 2022  9:30 AM. If you have any questions, ask your nurse or doctor.          STOP taking these medications    glimepiride 2 MG tablet Commonly known as: AMARYL Stopped by: Dorita Sciara, MD       TAKE these medications    acetaminophen 500 MG tablet Commonly known as: TYLENOL Take 2  tablets (1,000 mg total) by mouth every 6 (six) hours as needed.   Alcohol Swabs Pads Use as directed once a day   alendronate 70 MG tablet Commonly known as: FOSAMAX TAKE 1 TABLET ONCE WEEKLY *TAKE WITH A FULL GLASS OF WATER ON AN EMPTY STOMACH*   amLODipine 10 MG tablet Commonly known as: NORVASC Take 1 tablet (10 mg total) by mouth daily.   aspirin 81 MG tablet Take 81 mg by mouth daily.   atorvastatin 20 MG tablet Commonly known as: LIPITOR TAKE 1 TABLET (20 MG TOTAL) BY MOUTH DAILY FOR CHOLESTEROL   brimonidine-timolol 0.2-0.5 % ophthalmic solution Commonly known as: COMBIGAN Place 1 drop into both eyes 2 (two) times daily.   COLLAGEN PO Take by mouth. 1 scoop daily   empagliflozin 25 MG Tabs tablet Commonly known as: Jardiance Take 1 tablet (25 mg total) by mouth daily before breakfast.   fish oil-omega-3 fatty acids 1000 MG capsule Take 2 g by mouth daily.   glipiZIDE 5 MG tablet Commonly known as: GLUCOTROL Take 1 tablet (5 mg total) by mouth 2 (two) times daily before a meal. Started by: Dorita Sciara, MD   losartan-hydrochlorothiazide 100-25 MG tablet Commonly known as: HYZAAR TAKE 1 TABLET BY MOUTH DAILY FOR BLOOD PRESSURE AND KIDNEY PROTECTION   metoprolol succinate 50 MG 24 hr tablet Commonly known as: TOPROL-XL Take 1 tablet (50 mg total) by mouth daily. Take with or immediately following a meal   multivitamin tablet Take 1 tablet by mouth daily.   tamsulosin 0.4 MG Caps capsule Commonly known as: FLOMAX Take 1 capsule (0.4 mg total) by mouth daily after supper.   True Metrix Blood Glucose Test test strip Generic drug: glucose blood USE TO CHECK BLOOD GLUCOSE ONCE A DAY   True Metrix Level 3 High Soln Use to check controls on glucometer strips every 30 days or with each new bottle of strips (whichever comes first).   True Metrix Level 1 Low Soln USE AS DIRECTED   True Metrix Meter w/Device Kit USE AS DIRECTED ONCE A DAY    TRUEplus Lancets 33G Misc Use to check blood sugar once a day.  DX  E11.9         OBJECTIVE:   Vital Signs: BP 134/88 (BP Location: Left Arm, Patient Position: Sitting, Cuff Size: Small)   Pulse 61   Ht 5\' 4"  (1.626 m)   Wt 125 lb (56.7 kg)   SpO2 94%   BMI 21.46 kg/m   Wt Readings from Last 3 Encounters:  08/25/22 125 lb (56.7 kg)  08/13/22 126 lb 6.4 oz (57.3 kg)  06/26/22 125 lb (56.7 kg)  Exam: General: Pt appears well and is in NAD  Lungs: Clear with good BS bilat   Heart: RRR   Extremities: No pretibial edema..  Neuro: MS is good with appropriate affect, pt is alert and Ox3   DM foot exam: 08/25/2022 The skin of the feet is without sores or ulcerations. The pedal pulses are 1+ on right and 1+ on left. The sensation is intact to a screening 5.07, 10 gram monofilament on the right          DATA REVIEWED:  Lab Results  Component Value Date   HGBA1C 13.7 (H) 08/13/2022   HGBA1C 9.6 (H) 02/01/2022   HGBA1C 9.2 (A) 11/10/2021    Latest Reference Range & Units 08/13/22 09:32  Sodium 135 - 145 mEq/L 140  Potassium 3.5 - 5.1 mEq/L 4.2  Chloride 96 - 112 mEq/L 101  CO2 19 - 32 mEq/L 31  Glucose 70 - 99 mg/dL 242 (H)  BUN 6 - 23 mg/dL 13  Creatinine 0.40 - 1.50 mg/dL 1.15  Calcium 8.4 - 10.5 mg/dL 9.0  Alkaline Phosphatase 39 - 117 U/L 122 (H)  Albumin 3.5 - 5.2 g/dL 3.3 (L)  AST 0 - 37 U/L 14  ALT 0 - 53 U/L 14  Total Protein 6.0 - 8.3 g/dL 5.7 (L)  Total Bilirubin 0.2 - 1.2 mg/dL 0.8  GFR >60.00 mL/min 56.82 (L)  Total CHOL/HDL Ratio  4  Cholesterol 0 - 200 mg/dL 189  HDL Cholesterol >39.00 mg/dL 48.20  LDL (calc) 0 - 99 mg/dL 117 (H)  MICROALB/CREAT RATIO 0.0 - 30.0 mg/g 516.6 (H)  NonHDL  141.21  Triglycerides 0.0 - 149.0 mg/dL 122.0  VLDL 0.0 - 40.0 mg/dL 24.4  (H): Data is abnormally high (L): Data is abnormally low    ASSESSMENT / PLAN / RECOMMENDATIONS:   1) Type 2 Diabetes Mellitus, poorly controlled, with CKD III and neuropathic  and macrovascular complications - Most recent A1c of 13.7 %. Goal A1c < 7.5 %.    -Patient with worsening glycemic control, this has been attributed to lack of glycemic agent intake due to memory issues that the patient has been experiencing, per his son, the patient did not have any Jardiance at the house and they are not sure if he has been taking any glimepiride -He currently has his son Merry Proud living with him and will start managing his medications -I did explain to the patient and his son that under different circumstances insulin will be recommended with an A1c >10.0% -But given his advanced age and the fact that he may not be taking his medication we have opted to give sulfonylurea and Jardiance another try -I will switch glimepiride to glipizide as below -Will restart Jardiance as below -We switched Janumet to Jardiance due to a GFR of less than 45    MEDICATIONS: Stop glimepiride Start glipizide 5 mg, 1 tablet before breakfast went tablet before supper Restart Jardiance 25 mg daily   EDUCATION / INSTRUCTIONS: BG monitoring instructions: Patient is instructed to check his blood sugars 1 times a day. Call [facility] if: BG persistently < 70  I reviewed the Rule of 15 for the treatment of hypoglycemia in detail with the patient. Literature supplied.    2) Diabetic complications:  Eye: Does not have known diabetic retinopathy.  Neuro/ Feet: Does have known diabetic peripheral neuropathy .  Renal: Patient does  have known baseline CKD. He   is  on an ACEI/ARB at present.    F/U  in 1 month    Signed electronically by: Mack Guise, MD  [facility] Endocrinology  [address] [place], [place] [address] Phone: [phone] FAX: [phone]   CC: [name], DO [address] [place] [address] Phone: [phone]  Fax: [phone]  Return to Endocrinology clinic as  below: Future Appointments  Date Time Provider [place]  08/31/2022  9:45 AM [facility] NURSE [facility] None  08/31/2022 10:00 AM Rondel Jumbo, PA-C [facility] None  09/17/2022  9:40 AM [facility] ANNUAL WELLNESS VISIT 1 [facility]  09/27/2022 12:10 PM Kealie Barrie, Melanie Crazier, MD [facility] None  10/25/2022 10:00 AM [facility] LAB [facility] None  10/25/2022 10:40 AM Orson Slick, MD [facility] None  02/14/2023  9:00 AM [name], DO [facility]

## 2022-08-31 ENCOUNTER — Ambulatory Visit: Payer: Medicare HMO | Admitting: Physician Assistant

## 2022-08-31 ENCOUNTER — Encounter: Payer: Self-pay | Admitting: Physician Assistant

## 2022-08-31 VITALS — BP 94/51 | HR 92 | Ht 64.0 in | Wt 123.0 lb

## 2022-08-31 DIAGNOSIS — R413 Other amnesia: Secondary | ICD-10-CM

## 2022-08-31 NOTE — Patient Instructions (Signed)
It was a pleasure to see you today at our office.   Recommendations:  Follow up in  3 months Please monitor the sugars, fix the A1C, blood pressure control, increase  the hydration and  Memantine is a memory medicine for the treatment of dementia  Side effects include constipation, dizziness, headache, somnolence.     Donepezil side effects are hard to tolerate. They can alter the slope of progression for a short period of time in dementia and patients may have better function longer.  Side effects include diarrhea, muscle cramps, vivid dreams and slow hear rate, QT abnormalities on EKG  Whom to call:  Memory  decline, memory medications: Call our office [phone]   For psychiatric meds, mood meds: Please have your primary care physician manage these medications.   Counseling regarding caregiver distress, including caregiver depression, anxiety and issues regarding community resources, adult day care programs, adult living facilities, or memory care questions:   Feel free to contact Br[place], Social Worker at [phone]   For assessment of decision of mental capacity and competency:  Call Dr. Anthoney Harada, geriatric psychiatrist at [phone]  For guidance in geriatric dementia issues please call [facility] [phone]  For guidance regarding [facility] and if placement were needed at the facility, contact Arnell Asal, Social Worker tel: [phone]  If you have any severe symptoms of a stroke, or other severe issues such as confusion,severe chills or fever, etc call 911 or go to the ER as you may need to be evaluated further   Feel free to visit Facebook page " [email or web address]" for tips of how to care for people with memory problems.    Feel free to go to the following database for funded clinical studies conducted around the world: http://saunders.com/   https://www.triadclinicaltrials.com/     RECOMMENDATIONS FOR ALL  PATIENTS WITH MEMORY PROBLEMS: 1. Continue to exercise (Recommend 30 minutes of walking everyday, or 3 hours every week) 2. Increase social interactions - continue going to [place] and enjoy social gatherings with friends and family 3. Eat healthy, avoid fried foods and eat more fruits and vegetables 4. Maintain adequate blood pressure, blood sugar, and blood cholesterol level. Reducing the risk of stroke and cardiovascular disease also helps promoting better memory. 5. Avoid stressful situations. Live a simple life and avoid aggravations. Organize your time and prepare for the next day in anticipation. 6. Sleep well, avoid any interruptions of sleep and avoid any distractions in the bedroom that may interfere with adequate sleep quality 7. Avoid sugar, avoid sweets as there is a strong link between excessive sugar intake, diabetes, and cognitive impairment We discussed the Mediterranean diet, which has been shown to help patients reduce the risk of progressive memory disorders and reduces cardiovascular risk. This includes eating fish, eat fruits and green leafy vegetables, nuts like almonds and hazelnuts, walnuts, and also use olive oil. Avoid fast foods and fried foods as much as possible. Avoid sweets and sugar as sugar use has been linked to worsening of memory function.  There is always a concern of gradual progression of memory problems. If this is the case, then we may need to adjust level of care according to patient needs. Support, both to the patient and caregiver, should then be put into place.    FALL PRECAUTIONS: Be cautious when walking. Scan the area for obstacles that may increase the risk of trips and falls. When getting up in the mornings, sit up at the edge  of the bed for a few minutes before getting out of bed. Consider elevating the bed at the head end to avoid drop of blood pressure when getting up. Walk always in a well-lit room (use night lights in the walls). Avoid area rugs or  power cords from appliances in the middle of the walkways. Use a walker or a cane if necessary and consider physical therapy for balance exercise. Get your eyesight checked regularly.  FINANCIAL OVERSIGHT: Supervision, especially oversight when making financial decisions or transactions is also recommended.  HOME SAFETY: Consider the safety of the kitchen when operating appliances like stoves, microwave oven, and blender. Consider having supervision and share cooking responsibilities until no longer able to participate in those. Accidents with firearms and other hazards in the house should be identified and addressed as well.   ABILITY TO BE LEFT ALONE: If patient is unable to contact 911 operator, consider using [facility], or when the need is there, arrange for someone to stay with patients. Smoking is a fire hazard, consider supervision or cessation. Risk of wandering should be assessed by caregiver and if detected at any point, supervision and safe proof recommendations should be instituted.  MEDICATION SUPERVISION: Inability to self-administer medication needs to be constantly addressed. Implement a mechanism to ensure safe administration of the medications.   DRIVING: Regarding driving, in patients with progressive memory problems, driving will be impaired. We advise to have someone else do the driving if trouble finding directions or if minor accidents are reported. Independent driving assessment is available to determine safety of driving.   If you are interested in the driving assessment, you can contact the following:  [employer] in [place]  [place] [phone]  [place]  [facility] [phone] or [phone]

## 2022-08-31 NOTE — Progress Notes (Cosign Needed Addendum)
Assessment/Plan:    The patient is seen in neurologic consultation at the request of [name], * for the evaluation of memory.  [name] is a very pleasant 89 y.o. year old RH male with a history of hypertension, hyperlipidemia, bilateral carotid artery stenosis, DM2, smoldering multiple myeloma, CKD stage IIIb, osteoporosis, BPH, history of prostate cancer, anemia of chronic disease, monoclonal gammopathy, glaucoma, vitamin D deficiency, CAD, seen today for evaluation of memory loss. MoCA today is 16/30. Able to participate on his ADLs   Memory Impairment  Folllow up in 3 months  Risks and benefits of antidementia medications were discussed, and will be entertained by the patient and his son .Due to several medical issues that need to be adjusted by PCP and specialties, including significant cardiac history, new diagnosis of MM and current low blood pressure, will "regroup" in 3 months. This decision is felt to be prudent, to prevent medical complications .  Recommend good control of cardiovascular risk factors.     Subjective:    The patient is accompanied by his son  who supplements the history.    How long did patient have memory difficulties? "For the last 2 year he was losing his wallet, having difficulty remembering recent conversations and people names ". "Since his fall in January, his memory worsened, forgetting within minutes, not knowing where the money is, date, remembering appointments and simple tasks". "When I asked him Who is your MD, he opened the medicine cabinet to find out the name"  repeats oneself?  Endorsed, for the last 1.5 years, worse recently  Disoriented when walking into a room?  Patient denies except occasionally not remembering what patient came to the room for    Leaving objects in unusual places?  "he does misplace things".   Wandering behavior? denies   Any personality changes since last visit? denies   Any history of depression?:   denies   Hallucinations or paranoia?  denies   Seizures? denies    Any sleep changes?  Denies vivid dreams, REM behavior or sleepwalking . Takes Mg to help him relax Sleep apnea? denies   Any hygiene concerns?  denies   Independent of bathing and dressing?  Endorsed  Does the patient need help with medications?  Son is in charge because he was not taking medications correctly  Who is in charge of the finances?   Patient is in charge but sons are involved after some confusion with the bills    Any changes in appetite? "It is ok" " Eats in a restaurant several times a week . Seldom he cooks a breakfast " Patient have trouble swallowing?  denies   Does the patient cook?seldom  Any kitchen accidents such as leaving the stove on? Patient denies   Any headaches?  denies   Chronic back pain?  denies   Ambulates with difficulty? 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  Endorsed, last February he fell while gardening, mechanical, with some residual right-sided low back pain, no loss of consciousness, no head injury. Vision changes? Denies, had corneal implants. Unilateral weakness, numbness or tingling?  denies   Any tremors?  denies   Any anosmia?  denies   Any incontinence of urine? For the last 5 months he has more urgency.  Any bowel dysfunction?    denies      Patient lives with his son   History of heavy alcohol intake? denies  History of heavy tobacco use? denies   Family history of dementia?  No Does patient drive?  Short distances to [employer], his friend home, grocery shop, but may have gotten lost going to the dentist   No Known Allergies  Current Outpatient Medications  Medication Instructions   acetaminophen (TYLENOL) 1,000 mg, Oral, Every 6 hours PRN   Alcohol Swabs PADS Use as directed once a day   alendronate (FOSAMAX) 70 MG tablet TAKE 1 TABLET ONCE WEEKLY *TAKE WITH A FULL GLASS OF WATER ON AN EMPTY STOMACH*   amLODipine (NORVASC) 10 mg,  Oral, Daily   aspirin 81 mg, Oral, Daily   atorvastatin (LIPITOR) 20 mg, Oral, Daily, For cholesterol   Blood Glucose Calibration (TRUE METRIX LEVEL 1) Low SOLN USE AS DIRECTED   Blood Glucose Calibration (TRUE METRIX LEVEL 3) High SOLN Use to check controls on glucometer strips every 30 days or with each new bottle of strips (whichever comes first).   Blood Glucose Monitoring Suppl (TRUE METRIX METER) w/Device KIT USE AS DIRECTED ONCE A DAY   brimonidine-timolol (COMBIGAN) 0.2-0.5 % ophthalmic solution 1 drop, Both Eyes, 2 times daily   COLLAGEN PO Take by mouth. 1 scoop daily   empagliflozin (JARDIANCE) 25 mg, Oral, Daily before breakfast   fish oil-omega-3 fatty acids 2 g, Oral, Daily,     glipiZIDE (GLUCOTROL) 5 mg, Oral, 2 times daily before meals   glucose blood (TRUE METRIX BLOOD GLUCOSE TEST) test strip USE TO CHECK BLOOD GLUCOSE ONCE A DAY   losartan-hydrochlorothiazide (HYZAAR) 100-25 MG tablet 1 tablet, Oral, Daily, For Blood pressure and kidney protection   metoprolol succinate (TOPROL-XL) 50 mg, Oral, Daily, Take with or immediately following a meal   Multiple Vitamin (MULTIVITAMIN) tablet 1 tablet, Oral, Daily   tamsulosin (FLOMAX) 0.4 mg, Oral, Daily after supper   TRUEplus Lancets 33G MISC Use to check blood sugar once a day.  DX  E11.9     VITALS:   Vitals:   08/31/22 0930  BP: (!) 94/51  Pulse: 92  SpO2: 97%  Weight: 123 lb (55.8 kg)  Height: 5\' 4"  (1.626 m)      PHYSICAL EXAM   HEENT:  Normocephalic, atraumatic. The mucous membranes are moist. The superficial temporal arteries are without ropiness or tenderness. Cardiovascular: Regular rate and rhythm. Lungs: Clear to auscultation bilaterally. Neck: There are no carotid bruits noted bilaterally.  NEUROLOGICAL:    08/31/2022   10:00 AM  Montreal Cognitive Assessment   Visuospatial/ Executive (0/5) 3  Naming (0/3) 3  Attention: Read list of digits (0/2) 1  Attention: Read list of letters (0/1) 1   Attention: Serial 7 subtraction starting at 100 (0/3) 1  Language: Repeat phrase (0/2) 0  Language : Fluency (0/1) 0  Abstraction (0/2) 2  Delayed Recall (0/5) 0  Orientation (0/6) 5  Total 16  Adjusted Score (based on education) 16        No data to display           Orientation:  Alert and oriented to person, place and time. No aphasia or dysarthria. Fund of knowledge is appropriate. Recent and remote memory impaired  Attention and concentration are reduced  Able to name objects and unable to repeat phrases. Delayed recall 0/5 Cranial nerves: There is good facial symmetry. Extraocular muscles are intact and visual fields are full to confrontational testing. Speech is fluent and clear. no tongue deviation. Hearing is intact to conversational tone.  Tone: Tone is good throughout. Sensation: Sensation  is intact to light touch and pinprick throughout. Vibration is intact at the bilateral big toe.There is no extinction with double simultaneous stimulation. There is no sensory dermatomal level identified. Coordination: The patient has no difficulty with RAM's or FNF bilaterally. Normal finger to nose  Motor: Strength is 5/5 in the bilateral upper and lower extremities. There is no pronator drift. There are no fasciculations noted. DTR's: Deep tendon reflexes are 2/4 at the bilateral biceps, triceps, brachioradialis, patella and achilles.  Plantar responses are downgoing bilaterally. Gait and Station: The patient is able to ambulate without difficulty.The patient is able to ambulate in a tandem fashion, able to stand in the Romberg position.     Thank you for allowing [place] the opportunity to participate in the care of this nice patient. Please do not hesitate to contact us for any questions or concerns.   Total time spent on today's visit was 64 minutes dedicated to this patient today, preparing to see patient, examining the patient, ordering tests and/or medications and counseling the  patient, documenting clinical information in the EHR or other health record, independently interpreting results and communicating results to the patient/family, discussing treatment and goals, answering patient's questions and coordinating care.  Cc:  Algis Liming [facility] 08/31/2022 5:48 PM

## 2022-09-17 ENCOUNTER — Ambulatory Visit (INDEPENDENT_AMBULATORY_CARE_PROVIDER_SITE_OTHER): Payer: Medicare HMO | Admitting: *Deleted

## 2022-09-17 VITALS — BP 114/54 | HR 55 | Ht 64.0 in | Wt 123.6 lb

## 2022-09-17 DIAGNOSIS — Z0000 Encounter for general adult medical examination without abnormal findings: Secondary | ICD-10-CM | POA: Diagnosis not present

## 2022-09-17 NOTE — Progress Notes (Signed)
Subjective:   [name] is a 89 y.o. male who presents for Medicare Annual/Subsequent preventive examination.  Review of Systems     Cardiac Risk Factors include: advanced age (>24men, >41 women);male gender;diabetes mellitus;hypertension;dyslipidemia     Objective:    Today's Vitals   09/17/22 0937  BP: (!) 110/51  Pulse: (!) 54  Weight: 123 lb 9.6 oz (56.1 kg)  Height:  (1.626 m)   Body mass index is 21.22 kg/m.     09/17/2022    9:39 AM 08/31/2022    9:32 AM 09/15/2021    9:51 AM 07/27/2021   11:25 AM 10/01/2020    8:40 PM 09/11/2020    8:46 AM 07/24/2020   10:19 AM  Advanced Directives  Does Patient Have a Medical Advance Directive? Yes No Yes Yes Yes Yes No  Type of Estate agent of [place];Living will  Healthcare Power of [place];Out of facility DNR (pink MOST or yellow form);Living will Healthcare Power of [place];Living will  Healthcare Power of [place];Living will   Does patient want to make changes to medical advance directive? No - Patient declined        Copy of Healthcare Power of Attorney in Chart? No - copy requested  No - copy requested   No - copy requested   Would patient like information on creating a medical advance directive?    No - Patient declined   No - Patient declined    Current Medications (verified) Outpatient Encounter Medications as of 09/17/2022  Medication Sig   acetaminophen (TYLENOL) 500 MG tablet Take 2 tablets (1,000 mg total) by mouth every 6 (six) hours as needed.   Alcohol Swabs PADS Use as directed once a day   alendronate (FOSAMAX) 70 MG tablet TAKE 1 TABLET ONCE WEEKLY *TAKE WITH A FULL GLASS OF WATER ON AN EMPTY STOMACH*   amLODipine (NORVASC) 10 MG tablet Take 1 tablet (10 mg total) by mouth daily.   aspirin 81 MG tablet Take 81 mg by mouth daily.   atorvastatin (LIPITOR) 20 MG tablet TAKE 1 TABLET (20 MG TOTAL) BY MOUTH DAILY FOR CHOLESTEROL   Blood Glucose Calibration (TRUE METRIX LEVEL 1) Low SOLN  USE AS DIRECTED   Blood Glucose Calibration (TRUE METRIX LEVEL 3) High SOLN Use to check controls on glucometer strips every 30 days or with each new bottle of strips (whichever comes first).   Blood Glucose Monitoring Suppl (TRUE METRIX METER) w/Device KIT USE AS DIRECTED ONCE A DAY   brimonidine-timolol (COMBIGAN) 0.2-0.5 % ophthalmic solution Place 1 drop into both eyes 2 (two) times daily.   COLLAGEN PO Take by mouth. 1 scoop daily (Patient not taking: Reported on 08/31/2022)   empagliflozin (JARDIANCE) 25 MG TABS tablet Take 1 tablet (25 mg total) by mouth daily before breakfast.   fish oil-omega-3 fatty acids 1000 MG capsule Take 2 g by mouth daily.   glipiZIDE (GLUCOTROL) 5 MG tablet Take 1 tablet (5 mg total) by mouth 2 (two) times daily before a meal.   glucose blood (TRUE METRIX BLOOD GLUCOSE TEST) test strip USE TO CHECK BLOOD GLUCOSE ONCE A DAY   losartan-hydrochlorothiazide (HYZAAR) 100-25 MG tablet TAKE 1 TABLET BY MOUTH DAILY FOR BLOOD PRESSURE AND KIDNEY PROTECTION   metoprolol succinate (TOPROL-XL) 50 MG 24 hr tablet Take 1 tablet (50 mg total) by mouth daily. Take with or immediately following a meal   Multiple Vitamin (MULTIVITAMIN) tablet Take 1 tablet by mouth daily.   tamsulosin (FLOMAX) 0.4  MG CAPS capsule Take 1 capsule (0.4 mg total) by mouth daily after supper.   TRUEplus Lancets 33G MISC Use to check blood sugar once a day.  DX  E11.9   No facility-administered encounter medications on file as of 09/17/2022.    Allergies (verified) Patient has no known allergies.   History: Past Medical History:  Diagnosis Date   CAD (coronary artery disease)    LHC 03/25/11 by Dr. Excell Seltzer:  pLAD 99%, oCFX 20-30%, pOM1 40%, dAVCFX 20-30%.  EF was normal on nuclear study.  He was treated with a Promus DES to his pLAD.    Colon polyps 1996   villous adenoma   DM type 2 (diabetes mellitus, type 2) 2002   Fuchs' corneal dystrophy    Hyperlipidemia    Hypertension    Macular  degeneration    posterior vitreous vitreous detachment   Prostate cancer dx 2018   Past Surgical History:  Procedure Laterality Date   CARDIAC CATHETERIZATION  03/25/2011   cataract Bilateral 2010   corenea implants  june and sept 2018   dr Molli [facility]   CORONARY STENT PLACEMENT  03/25/2011   RADIOACTIVE SEED IMPLANT N/A 05/06/2017   Procedure: RADIOACTIVE SEED IMPLANT/BRACHYTHERAPY IMPLANT;  Surgeon: Ihor Gully, MD;  Location: [facility];  Service: Urology;  Laterality: N/A;   SPACE OAR INSTILLATION N/A 05/06/2017   Procedure: SPACE OAR INSTILLATION;  Surgeon: Ihor Gully, MD;  Location: [facility];  Service: Urology;  Laterality: N/A;   Family History  Problem Relation Age of Onset   Stomach cancer Mother 80   Coronary artery disease Father 59       deceased   Healthy Son    Healthy Son    Healthy Son    Social History   Socioeconomic History   Marital status: Widowed    Spouse name: Not on file   Number of children: Not on file   Years of education: Not on file   Highest education level: Not on file  Occupational History   Occupation: retired  Tobacco Use   Smoking status: Former    Packs/day: 0.25    Years: 3.00    Additional pack years: 0.00    Total pack years: 0.75    Types: Cigarettes    Quit date: 06/07/1958    Years since quitting: 64.3   Smokeless tobacco: Never  Vaping Use   Vaping Use: Never used  Substance and Sexual Activity   Alcohol use: Not Currently   Drug use: No   Sexual activity: Not Currently  Other Topics Concern   Not on file  Social History Narrative   Right   Lives with son    Retired   Two story home   Caffeine  2 cup a day   Still drives   Social Determinants of Health   Financial Resource Strain: Low Risk  (06/23/2021)   Overall Financial Resource Strain (CARDIA)    Difficulty of Paying Living Expenses: Not hard at all  Food Insecurity: No Food Insecurity (09/17/2022)   Hunger Vital  Sign    Worried About Running Out of Food in the Last Year: Never true    Ran Out of Food in the Last Year: Never true  Transportation Needs: No Transportation Needs (09/17/2022)   PRAPARE - Administrator, Civil Service (Medical): No    Lack of Transportation (Non-Medical): No  Physical Activity: Sufficiently Active (06/23/2021)   Exercise Vital Sign    Days  of Exercise per Week: 7 days    Minutes of Exercise per Session: 30 min  Stress: No Stress Concern Present (09/15/2021)   [employer] of Occupational Health - Occupational Stress Questionnaire    Feeling of Stress : Not at all  Social Connections: Moderately Integrated (12/18/2020)   Social Connection and Isolation Panel [NHANES]    Frequency of Communication with Friends and Family: More than three times a week    Frequency of Social Gatherings with Friends and Family: Twice a week    Attends Religious Services: More than 4 times per year    Active Member of [employer] or Organizations: Yes    Attends Banker Meetings: More than 4 times per year    Marital Status: Widowed    Tobacco Counseling Counseling given: Not Answered   Clinical Intake:  Pre-visit preparation completed: Yes  Pain : No/denies pain     BMI - recorded: 21.22 Nutritional Status: BMI of 19-24  Normal Nutritional Risks: None Diabetes: Yes CBG done?: No Did pt. bring in CBG monitor from home?: No  How often do you need to have someone help you when you read instructions, pamphlets, or other written materials from your doctor or pharmacy?: 1 - Never  Activities of Daily Living    09/17/2022    9:45 AM  In your present state of health, do you have any difficulty performing the following activities:  Hearing? 0  Vision? 0  Difficulty concentrating or making decisions? 1  Comment some slight memory loss  Walking or climbing stairs? 0  Dressing or bathing? 0  Doing errands, shopping? 0  Preparing Food and eating ? N  Using  the Toilet? N  In the past six months, have you accidently leaked urine? Y  Do you have problems with loss of bowel control? N  Managing your Medications? N  Managing your Finances? N  Housekeeping or managing your Housekeeping? N    Patient Care Team: Zola Button, Grayling Congress, DO as PCP - General (Family Medicine) Ihor Gully, MD (Inactive) as Consulting Physician (Urology) Margaretmary Dys, MD as Consulting Physician (Radiation Oncology) Holli Humbles, MD as Referring Physician (Ophthalmology) Jaci Standard, MD as Consulting Physician (Hematology and Oncology) [facility], Konrad Dolores, MD as Consulting Physician (Endocrinology) Noel Christmas, MD as Consulting Physician (Urology) Bufford Buttner, MD as Consulting Physician (Nephrology) Henrene Pastor, RPH-CPP (Pharmacist) Elwyn Reach (Neurology)  Indicate any recent Medical Services you may have received from other than [facility] providers in the past year (date may be approximate).     Assessment:   This is a routine wellness examination for [place].  Hearing/Vision screen No results found.  Dietary issues and exercise activities discussed: Current Exercise Habits: Home exercise routine, Type of exercise: Other - see comments;walking;treadmill (runs), Time (Minutes): 20, Frequency (Times/Week): 3, Weekly Exercise (Minutes/Week): 60, Intensity: Moderate, Exercise limited by: None identified   Goals Addressed   None    Depression Screen    09/17/2022    9:44 AM 08/13/2022    9:04 AM 09/15/2021    9:52 AM 12/04/2020    8:38 AM 09/11/2020    8:48 AM 03/24/2020   11:02 AM 09/10/2019    8:19 AM  PHQ 2/9 Scores  PHQ - 2 Score 0 0 0 0 0 0 0    Fall Risk    08/31/2022    9:32 AM 08/13/2022    9:04 AM 09/15/2021    9:52 AM 12/04/2020    8:38 AM  09/11/2020    8:47 AM  Fall Risk   Falls in the past year? 1 1 0 0 0  Number falls in past yr: 1 0 0 0 0  Injury with Fall? 1 1 0 0 0  Risk for fall due to :  Impaired  balance/gait No Fall Risks    Follow up Falls evaluation completed  Falls evaluation completed Falls evaluation completed Falls prevention discussed    FALL RISK PREVENTION PERTAINING TO THE HOME:  Any stairs in or around the home? Yes  If so, are there any without handrails? No  Home free of loose throw rugs in walkways, pet beds, electrical cords, etc? Yes  Adequate lighting in your home to reduce risk of falls? Yes   ASSISTIVE DEVICES UTILIZED TO PREVENT FALLS:  Life alert? No  Use of a cane, walker or w/c? No  Grab bars in the bathroom? No  Shower chair or bench in shower? No  Elevated toilet seat or a handicapped toilet? No   TIMED UP AND GO:  Was the test performed? Yes .  Length of time to ambulate 10 feet: 7 sec.   Gait steady and fast without use of assistive device  Cognitive Function:      08/31/2022   10:00 AM  Montreal Cognitive Assessment   Visuospatial/ Executive (0/5) 3  Naming (0/3) 3  Attention: Read list of digits (0/2) 1  Attention: Read list of letters (0/1) 1  Attention: Serial 7 subtraction starting at 100 (0/3) 1  Language: Repeat phrase (0/2) 0  Language : Fluency (0/1) 0  Abstraction (0/2) 2  Delayed Recall (0/5) 0  Orientation (0/6) 5  Total 16  Adjusted Score (based on education) 16      09/17/2022    9:51 AM 09/15/2021    9:57 AM 09/11/2020    8:57 AM  6CIT Screen  What Year? 0 points 0 points 0 points  What month? 3 points 0 points 0 points  What time? 3 points 0 points 0 points  Count back from 20 0 points 0 points 0 points  Months in reverse 0 points 4 points 2 points  Repeat phrase 10 points 0 points 2 points  Total Score 16 points 4 points 4 points    Immunizations Immunization History  Administered Date(s) Administered   Fluad Quad(high Dose 65+) 04/06/2019, 04/28/2020, 08/03/2021   Influenza Split 03/05/2011, 04/24/2012   Influenza Whole 02/27/2008, 03/04/2010   Influenza, High Dose Seasonal PF 04/01/2017, 03/14/2018    Influenza,inj,quad, With Preservative 03/07/2013   Influenza-Unspecified 03/08/2015   PFIZER(Purple Top)SARS-COV-2 Vaccination 08/13/2019, 09/18/2019, 03/21/2020   Pneumococcal Conjugate-13 08/26/2014   Pneumococcal Polysaccharide-23 02/27/2008, 07/31/2020    TDAP status: Due, Education has been provided regarding the importance of this vaccine. Advised may receive this vaccine at local pharmacy or Health Dept. Aware to provide a copy of the vaccination record if obtained from local pharmacy or Health Dept. Verbalized acceptance and understanding.  Flu Vaccine status: Up to date  Pneumococcal vaccine status: Up to date  Covid-19 vaccine status: Information provided on how to obtain vaccines.   Qualifies for Shingles Vaccine? Yes   Zostavax completed No   Shingrix Completed?: No.    Education has been provided regarding the importance of this vaccine. Patient has been advised to call insurance company to determine out of pocket expense if they have not yet received this vaccine. Advised may also receive vaccine at local pharmacy or Health Dept. Verbalized acceptance and understanding.  Screening Tests  Health Maintenance  Topic Date Due   DTaP/Tdap/Td (1 - Tdap) Never done   Zoster Vaccines- Shingrix (1 of 2) Never done   COVID-19 Vaccine (4 - 2023-24 season) 02/05/2022   Medicare Annual Wellness (AWV)  09/16/2022   INFLUENZA VACCINE  01/06/2023   HEMOGLOBIN A1C  02/13/2023   OPHTHALMOLOGY EXAM  04/01/2023   FOOT EXAM  08/25/2023   Pneumonia Vaccine 77+ Years old  Completed   HPV VACCINES  Aged Out    Health Maintenance  Health Maintenance Due  Topic Date Due   DTaP/Tdap/Td (1 - Tdap) Never done   Zoster Vaccines- Shingrix (1 of 2) Never done   COVID-19 Vaccine (4 - 2023-24 season) 02/05/2022   Medicare Annual Wellness (AWV)  09/16/2022    Colorectal cancer screening: No longer required.   Lung Cancer Screening: (Low Dose CT Chest recommended if Age 38-80 years, 30  pack-year currently smoking OR have quit w/in 15years.) does not qualify.   Additional Screening:  Hepatitis C Screening: does not qualify   Vision Screening: Recommended annual ophthalmology exams for early detection of glaucoma and other disorders of the eye. Is the patient up to date with their annual eye exam?  Yes  Who is the provider or what is the name of the office in which the patient attends annual eye exams? Doesn't remember name at this time If pt is not established with a provider, would they like to be referred to a provider to establish care? No .   Dental Screening: Recommended annual dental exams for proper oral hygiene  Community Resource Referral / Chronic Care Management: CRR required this visit?  No   CCM required this visit?  No      Plan:     I have personally reviewed and noted the following in the patient's chart:   Medical and social history Use of alcohol, tobacco or illicit drugs  Current medications and supplements including opioid prescriptions. Patient is not currently taking opioid prescriptions. Functional ability and status Nutritional status Physical activity Advanced directives List of other physicians Hospitalizations, surgeries, and ER visits in previous 12 months Vitals Screenings to include cognitive, depression, and falls Referrals and appointments  In addition, I have reviewed and discussed with patient certain preventive protocols, quality metrics, and best practice recommendations. A written personalized care plan for preventive services as well as general preventive health recommendations were provided to patient.     Donne Anon, [place]   09/17/2022   Nurse Notes: None

## 2022-09-17 NOTE — Patient Instructions (Signed)
[name] , Thank you for taking time to come for your Medicare Wellness Visit. I appreciate your ongoing commitment to your health goals. Please review the following plan we discussed and let me know if I can assist you in the future.   This is a list of the screening recommended for you and due dates:  Health Maintenance  Topic Date Due   DTaP/Tdap/Td vaccine (1 - Tdap) Never done   Zoster (Shingles) Vaccine (1 of 2) Never done   COVID-19 Vaccine (4 - 2023-24 season) 02/05/2022   Flu Shot  01/06/2023   Hemoglobin A1C  02/13/2023   Eye exam for diabetics  04/01/2023   Complete foot exam   08/25/2023   Medicare Annual Wellness Visit  09/17/2023   Pneumonia Vaccine  Completed   HPV Vaccine  Aged Out    Next appointment: Follow up in one year for your annual wellness visit.   Preventive Care 87 Years and Older, Male Preventive care refers to lifestyle choices and visits with your health care provider that can promote health and wellness. What does preventive care include? A yearly physical exam. This is also called an annual well check. Dental exams once or twice a year. Routine eye exams. Ask your health care provider how often you should have your eyes checked. Personal lifestyle choices, including: Daily care of your teeth and gums. Regular physical activity. Eating a healthy diet. Avoiding tobacco and drug use. Limiting alcohol use. Practicing safe sex. Taking low doses of aspirin every day. Taking vitamin and mineral supplements as recommended by your health care provider. What happens during an annual well check? The services and screenings done by your health care provider during your annual well check will depend on your age, overall health, lifestyle risk factors, and family history of disease. Counseling  Your health care provider may ask you questions about your: Alcohol use. Tobacco use. Drug use. Emotional well-being. Home and relationship well-being. Sexual  activity. Eating habits. History of falls. Memory and ability to understand (cognition). Work and work Astronomer. Screening  You may have the following tests or measurements: Height, weight, and BMI. Blood pressure. Lipid and cholesterol levels. These may be checked every 5 years, or more frequently if you are over 87 years old. Skin check. Lung cancer screening. You may have this screening every year starting at age 87 if you have a 30-pack-year history of smoking and currently smoke or have quit within the past 15 years. Fecal occult blood test (FOBT) of the stool. You may have this test every year starting at age 87. Flexible sigmoidoscopy or colonoscopy. You may have a sigmoidoscopy every 5 years or a colonoscopy every 10 years starting at age 87. Prostate cancer screening. Recommendations will vary depending on your family history and other risks. Hepatitis C blood test. Hepatitis B blood test. Sexually transmitted disease (STD) testing. Diabetes screening. This is done by checking your blood sugar (glucose) after you have not eaten for a while (fasting). You may have this done every 1-3 years. Abdominal aortic aneurysm (AAA) screening. You may need this if you are a current or former smoker. Osteoporosis. You may be screened starting at age 87 if you are at high risk. Talk with your health care provider about your test results, treatment options, and if necessary, the need for more tests. Vaccines  Your health care provider may recommend certain vaccines, such as: Influenza vaccine. This is recommended every year. Tetanus, diphtheria, and acellular pertussis (Tdap, Td) vaccine. You  may need a Td booster every 10 years. Zoster vaccine. You may need this after age 87. Pneumococcal 13-valent conjugate (PCV13) vaccine. One dose is recommended after age 87. Pneumococcal polysaccharide (PPSV23) vaccine. One dose is recommended after age 87. Talk to your health care provider about which  screenings and vaccines you need and how often you need them. This information is not intended to replace advice given to you by your health care provider. Make sure you discuss any questions you have with your health care provider. Document Released: 06/20/2015 Document Revised: 02/11/2016 Document Reviewed: 03/25/2015 Elsevier Interactive Patient Education  2017 [employer] Prevention in the Home Falls can cause injuries. They can happen to people of all ages. There are many things you can do to make your home safe and to help prevent falls. What can I do on the outside of my home? Regularly fix the edges of walkways and driveways and fix any cracks. Remove anything that might make you trip as you walk through a door, such as a raised step or threshold. Trim any bushes or trees on the path to your home. Use bright outdoor lighting. Clear any walking paths of anything that might make someone trip, such as rocks or tools. Regularly check to see if handrails are loose or broken. Make sure that both sides of any steps have handrails. Any raised decks and porches should have guardrails on the edges. Have any leaves, snow, or ice cleared regularly. Use sand or salt on walking paths during winter. Clean up any spills in your garage right away. This includes oil or grease spills. What can I do in the bathroom? Use night lights. Install grab bars by the toilet and in the tub and shower. Do not use towel bars as grab bars. Use non-skid mats or decals in the tub or shower. If you need to sit down in the shower, use a plastic, non-slip stool. Keep the floor dry. Clean up any water that spills on the floor as soon as it happens. Remove soap buildup in the tub or shower regularly. Attach bath mats securely with double-sided non-slip rug tape. Do not have throw rugs and other things on the floor that can make you trip. What can I do in the bedroom? Use night lights. Make sure that you have a  light by your bed that is easy to reach. Do not use any sheets or blankets that are too big for your bed. They should not hang down onto the floor. Have a firm chair that has side arms. You can use this for support while you get dressed. Do not have throw rugs and other things on the floor that can make you trip. What can I do in the kitchen? Clean up any spills right away. Avoid walking on wet floors. Keep items that you use a lot in easy-to-reach places. If you need to reach something above you, use a strong step stool that has a grab bar. Keep electrical cords out of the way. Do not use floor polish or wax that makes floors slippery. If you must use wax, use non-skid floor wax. Do not have throw rugs and other things on the floor that can make you trip. What can I do with my stairs? Do not leave any items on the stairs. Make sure that there are handrails on both sides of the stairs and use them. Fix handrails that are broken or loose. Make sure that handrails are as long as the  stairways. Check any carpeting to make sure that it is firmly attached to the stairs. Fix any carpet that is loose or worn. Avoid having throw rugs at the top or bottom of the stairs. If you do have throw rugs, attach them to the floor with carpet tape. Make sure that you have a light switch at the top of the stairs and the bottom of the stairs. If you do not have them, ask someone to add them for you. What else can I do to help prevent falls? Wear shoes that: Do not have high heels. Have rubber bottoms. Are comfortable and fit you well. Are closed at the toe. Do not wear sandals. If you use a stepladder: Make sure that it is fully opened. Do not climb a closed stepladder. Make sure that both sides of the stepladder are locked into place. Ask someone to hold it for you, if possible. Clearly mark and make sure that you can see: Any grab bars or handrails. First and last steps. Where the edge of each step  is. Use tools that help you move around (mobility aids) if they are needed. These include: Canes. Walkers. Scooters. Crutches. Turn on the lights when you go into a dark area. Replace any light bulbs as soon as they burn out. Set up your furniture so you have a clear path. Avoid moving your furniture around. If any of your floors are uneven, fix them. If there are any pets around you, be aware of where they are. Review your medicines with your doctor. Some medicines can make you feel dizzy. This can increase your chance of falling. Ask your doctor what other things that you can do to help prevent falls. This information is not intended to replace advice given to you by your health care provider. Make sure you discuss any questions you have with your health care provider. Document Released: 03/20/2009 Document Revised: 10/30/2015 Document Reviewed: 06/28/2014 Elsevier Interactive Patient Education  2017 [employer].

## 2022-09-20 ENCOUNTER — Encounter: Payer: Self-pay | Admitting: *Deleted

## 2022-09-20 NOTE — Progress Notes (Signed)
Subjective:   [name] is a 89 y.o. male who presents for Medicare Annual/Subsequent preventive examination.  Review of Systems     Cardiac Risk Factors include: advanced age (>32men, >47 women);male gender;diabetes mellitus;hypertension;dyslipidemia     Objective:    Today's Vitals   09/17/22 0937 09/17/22 0955  BP: (!) 110/51 (!) 114/54  Pulse: (!) 54 (!) 55  Weight: 123 lb 9.6 oz (56.1 kg)   Height:  (1.626 m)    Body mass index is 21.22 kg/m.     09/17/2022    9:39 AM 08/31/2022    9:32 AM 09/15/2021    9:51 AM 07/27/2021   11:25 AM 10/01/2020    8:40 PM 09/11/2020    8:46 AM 07/24/2020   10:19 AM  Advanced Directives  Does Patient Have a Medical Advance Directive? Yes No Yes Yes Yes Yes No  Type of Estate agent of [place];Living will  Healthcare Power of [place];Out of facility DNR (pink MOST or yellow form);Living will Healthcare Power of [place];Living will  Healthcare Power of [place];Living will   Does patient want to make changes to medical advance directive? No - Patient declined        Copy of Healthcare Power of Attorney in Chart? No - copy requested  No - copy requested   No - copy requested   Would patient like information on creating a medical advance directive?    No - Patient declined   No - Patient declined    Current Medications (verified) Outpatient Encounter Medications as of 09/17/2022  Medication Sig   acetaminophen (TYLENOL) 500 MG tablet Take 2 tablets (1,000 mg total) by mouth every 6 (six) hours as needed.   Alcohol Swabs PADS Use as directed once a day   alendronate (FOSAMAX) 70 MG tablet TAKE 1 TABLET ONCE WEEKLY *TAKE WITH A FULL GLASS OF WATER ON AN EMPTY STOMACH*   amLODipine (NORVASC) 10 MG tablet Take 1 tablet (10 mg total) by mouth daily.   aspirin 81 MG tablet Take 81 mg by mouth daily.   atorvastatin (LIPITOR) 20 MG tablet TAKE 1 TABLET (20 MG TOTAL) BY MOUTH DAILY FOR CHOLESTEROL   Blood Glucose  Calibration (TRUE METRIX LEVEL 1) Low SOLN USE AS DIRECTED   Blood Glucose Calibration (TRUE METRIX LEVEL 3) High SOLN Use to check controls on glucometer strips every 30 days or with each new bottle of strips (whichever comes first).   Blood Glucose Monitoring Suppl (TRUE METRIX METER) w/Device KIT USE AS DIRECTED ONCE A DAY   brimonidine-timolol (COMBIGAN) 0.2-0.5 % ophthalmic solution Place 1 drop into both eyes 2 (two) times daily.   COLLAGEN PO Take by mouth. 1 scoop daily (Patient not taking: Reported on 08/31/2022)   empagliflozin (JARDIANCE) 25 MG TABS tablet Take 1 tablet (25 mg total) by mouth daily before breakfast.   fish oil-omega-3 fatty acids 1000 MG capsule Take 2 g by mouth daily.   glipiZIDE (GLUCOTROL) 5 MG tablet Take 1 tablet (5 mg total) by mouth 2 (two) times daily before a meal.   glucose blood (TRUE METRIX BLOOD GLUCOSE TEST) test strip USE TO CHECK BLOOD GLUCOSE ONCE A DAY   losartan-hydrochlorothiazide (HYZAAR) 100-25 MG tablet TAKE 1 TABLET BY MOUTH DAILY FOR BLOOD PRESSURE AND KIDNEY PROTECTION   metoprolol succinate (TOPROL-XL) 50 MG 24 hr tablet Take 1 tablet (50 mg total) by mouth daily. Take with or immediately following a meal   Multiple Vitamin (MULTIVITAMIN) tablet Take 1 tablet  by mouth daily.   tamsulosin (FLOMAX) 0.4 MG CAPS capsule Take 1 capsule (0.4 mg total) by mouth daily after supper.   TRUEplus Lancets 33G MISC Use to check blood sugar once a day.  DX  E11.9   No facility-administered encounter medications on file as of 09/17/2022.    Allergies (verified) Patient has no known allergies.   History: Past Medical History:  Diagnosis Date   CAD (coronary artery disease)    LHC 03/25/11 by Dr. Excell Seltzer:  pLAD 99%, oCFX 20-30%, pOM1 40%, dAVCFX 20-30%.  EF was normal on nuclear study.  He was treated with a Promus DES to his pLAD.    Colon polyps 1996   villous adenoma   DM type 2 (diabetes mellitus, type 2) 2002   Fuchs' corneal dystrophy     Hyperlipidemia    Hypertension    Macular degeneration    posterior vitreous vitreous detachment   Prostate cancer dx 2018   Past Surgical History:  Procedure Laterality Date   CARDIAC CATHETERIZATION  03/25/2011   cataract Bilateral 2010   corenea implants  june and sept 2018   dr Molli [facility]   CORONARY STENT PLACEMENT  03/25/2011   RADIOACTIVE SEED IMPLANT N/A 05/06/2017   Procedure: RADIOACTIVE SEED IMPLANT/BRACHYTHERAPY IMPLANT;  Surgeon: Ihor Gully, MD;  Location: [facility];  Service: Urology;  Laterality: N/A;   SPACE OAR INSTILLATION N/A 05/06/2017   Procedure: SPACE OAR INSTILLATION;  Surgeon: Ihor Gully, MD;  Location[facility];  Service: Urology;  Laterality: N/A;   Family History  Problem Relation Age of Onset   Stomach cancer Mother 39   Coronary artery disease Father 27       deceased   Healthy Son    Healthy Son    Healthy Son    Social History   Socioeconomic History   Marital status: Widowed    Spouse name: Not on file   Number of children: Not on file   Years of education: Not on file   Highest education level: Not on file  Occupational History   Occupation: retired  Tobacco Use   Smoking status: Former    Packs/day: 0.25    Years: 3.00    Additional pack years: 0.00    Total pack years: 0.75    Types: Cigarettes    Quit date: 06/07/1958    Years since quitting: 64.3   Smokeless tobacco: Never  Vaping Use   Vaping Use: Never used  Substance and Sexual Activity   Alcohol use: Not Currently   Drug use: No   Sexual activity: Not Currently  Other Topics Concern   Not on file  Social History Narrative   Right   Lives with son    Retired   Two story home   Caffeine  2 cup a day   Still drives   Social Determinants of Health   Financial Resource Strain: Low Risk  (06/23/2021)   Overall Financial Resource Strain (CARDIA)    Difficulty of Paying Living Expenses: Not hard at all  Food Insecurity: No  Food Insecurity (09/17/2022)   Hunger Vital Sign    Worried About Running Out of Food in the Last Year: Never true    Ran Out of Food in the Last Year: Never true  Transportation Needs: No Transportation Needs (09/17/2022)   PRAPARE - Administrator, Civil Service (Medical): No    Lack of Transportation (Non-Medical): No  Physical Activity: Sufficiently Active (06/23/2021)  Exercise Vital Sign    Days of Exercise per Week: 7 days    Minutes of Exercise per Session: 30 min  Stress: No Stress Concern Present (09/15/2021)   [employer] of Occupational Health - Occupational Stress Questionnaire    Feeling of Stress : Not at all  Social Connections: Moderately Integrated (12/18/2020)   Social Connection and Isolation Panel [NHANES]    Frequency of Communication with Friends and Family: More than three times a week    Frequency of Social Gatherings with Friends and Family: Twice a week    Attends Religious Services: More than 4 times per year    Active Member of [employer] or Organizations: Yes    Attends Banker Meetings: More than 4 times per year    Marital Status: Widowed    Tobacco Counseling Counseling given: Not Answered   Clinical Intake:  Pre-visit preparation completed: Yes  Pain : No/denies pain     BMI - recorded: 21.22 Nutritional Status: BMI of 19-24  Normal Nutritional Risks: None Diabetes: Yes CBG done?: No Did pt. bring in CBG monitor from home?: No  How often do you need to have someone help you when you read instructions, pamphlets, or other written materials from your doctor or pharmacy?: 1 - Never  Activities of Daily Living    09/17/2022    9:45 AM  In your present state of health, do you have any difficulty performing the following activities:  Hearing? 0  Vision? 0  Difficulty concentrating or making decisions? 1  Comment some slight memory loss  Walking or climbing stairs? 0  Dressing or bathing? 0  Doing errands,  shopping? 0  Preparing Food and eating ? N  Using the Toilet? N  In the past six months, have you accidently leaked urine? Y  Do you have problems with loss of bowel control? N  Managing your Medications? N  Managing your Finances? N  Housekeeping or managing your Housekeeping? N    Patient Care Team: Zola Button, Grayling Congress, DO as PCP - General (Family Medicine) Ihor Gully, MD (Inactive) as Consulting Physician (Urology) Margaretmary Dys, MD as Consulting Physician (Radiation Oncology) Holli Humbles, MD as Referring Physician (Ophthalmology) Jaci Standard, MD as Consulting Physician (Hematology and Oncology) [facility], Konrad Dolores, MD as Consulting Physician (Endocrinology) Noel Christmas, MD as Consulting Physician (Urology) Bufford Buttner, MD as Consulting Physician (Nephrology) Henrene Pastor, RPH-CPP (Pharmacist) Elwyn Reach (Neurology)  Indicate any recent Medical Services you may have received from other than [facility] providers in the past year (date may be approximate).     Assessment:   This is a routine wellness examination for Audrey.  Hearing/Vision screen No results found.  Dietary issues and exercise activities discussed: Current Exercise Habits: Home exercise routine, Type of exercise: Other - see comments;walking;treadmill (runs), Time (Minutes): 20, Frequency (Times/Week): 3, Weekly Exercise (Minutes/Week): 60, Intensity: Moderate, Exercise limited by: None identified   Goals Addressed   None   Depression Screen    09/17/2022    9:44 AM 08/13/2022    9:04 AM 09/15/2021    9:52 AM 12/04/2020    8:38 AM 09/11/2020    8:48 AM 03/24/2020   11:02 AM 09/10/2019    8:19 AM  PHQ 2/9 Scores  PHQ - 2 Score 0 0 0 0 0 0 0    Fall Risk    09/20/2022    1:19 PM 08/31/2022    9:32 AM 08/13/2022    9:04 AM  09/15/2021    9:52 AM 12/04/2020    8:38 AM  Fall Risk   Falls in the past year? 1 1 1  0 0  Number falls in past yr: 1 1 0 0 0  Injury with  Fall? 1 1 1  0 0  Risk for fall due to : Impaired balance/gait;History of fall(s)  Impaired balance/gait No Fall Risks   Follow up Falls evaluation completed Falls evaluation completed  Falls evaluation completed Falls evaluation completed    FALL RISK PREVENTION PERTAINING TO THE HOME:  Any stairs in or around the home? Yes  If so, are there any without handrails? No  Home free of loose throw rugs in walkways, pet beds, electrical cords, etc? Yes  Adequate lighting in your home to reduce risk of falls? Yes   ASSISTIVE DEVICES UTILIZED TO PREVENT FALLS:  Life alert? No  Use of a cane, walker or w/c? No  Grab bars in the bathroom? No  Shower chair or bench in shower? No  Elevated toilet seat or a handicapped toilet? No   TIMED UP AND GO:  Was the test performed? Yes .  Length of time to ambulate 10 feet: 7 sec.   Gait steady and fast without use of assistive device  Cognitive Function:      08/31/2022   10:00 AM  Montreal Cognitive Assessment   Visuospatial/ Executive (0/5) 3  Naming (0/3) 3  Attention: Read list of digits (0/2) 1  Attention: Read list of letters (0/1) 1  Attention: Serial 7 subtraction starting at 100 (0/3) 1  Language: Repeat phrase (0/2) 0  Language : Fluency (0/1) 0  Abstraction (0/2) 2  Delayed Recall (0/5) 0  Orientation (0/6) 5  Total 16  Adjusted Score (based on education) 16      09/17/2022    9:51 AM 09/15/2021    9:57 AM 09/11/2020    8:57 AM  6CIT Screen  What Year? 0 points 0 points 0 points  What month? 3 points 0 points 0 points  What time? 3 points 0 points 0 points  Count back from 20 0 points 0 points 0 points  Months in reverse 0 points 4 points 2 points  Repeat phrase 10 points 0 points 2 points  Total Score 16 points 4 points 4 points    Immunizations Immunization History  Administered Date(s) Administered   Fluad Quad(high Dose 65+) 04/06/2019, 04/28/2020, 08/03/2021   Influenza Split 03/05/2011, 04/24/2012   Influenza  Whole 02/27/2008, 03/04/2010   Influenza, High Dose Seasonal PF 04/01/2017, 03/14/2018   Influenza,inj,quad, With Preservative 03/07/2013   Influenza-Unspecified 03/08/2015   PFIZER(Purple Top)SARS-COV-2 Vaccination 08/13/2019, 09/18/2019, 03/21/2020   Pneumococcal Conjugate-13 08/26/2014   Pneumococcal Polysaccharide-23 02/27/2008, 07/31/2020    TDAP status: Due, Education has been provided regarding the importance of this vaccine. Advised may receive this vaccine at local pharmacy or Health Dept. Aware to provide a copy of the vaccination record if obtained from local pharmacy or Health Dept. Verbalized acceptance and understanding.  Flu Vaccine status: Up to date  Pneumococcal vaccine status: Up to date  Covid-19 vaccine status: Information provided on how to obtain vaccines.   Qualifies for Shingles Vaccine? Yes   Zostavax completed No   Shingrix Completed?: No.    Education has been provided regarding the importance of this vaccine. Patient has been advised to call insurance company to determine out of pocket expense if they have not yet received this vaccine. Advised may also receive vaccine at local pharmacy or  Health Dept. Verbalized acceptance and understanding.  Screening Tests Health Maintenance  Topic Date Due   DTaP/Tdap/Td (1 - Tdap) Never done   Zoster Vaccines- Shingrix (1 of 2) Never done   COVID-19 Vaccine (4 - 2023-24 season) 02/05/2022   INFLUENZA VACCINE  01/06/2023   HEMOGLOBIN A1C  02/13/2023   OPHTHALMOLOGY EXAM  04/01/2023   FOOT EXAM  08/25/2023   Medicare Annual Wellness (AWV)  09/17/2023   Pneumonia Vaccine 23+ Years old  Completed   HPV VACCINES  Aged Out    Health Maintenance  Health Maintenance Due  Topic Date Due   DTaP/Tdap/Td (1 - Tdap) Never done   Zoster Vaccines- Shingrix (1 of 2) Never done   COVID-19 Vaccine (4 - 2023-24 season) 02/05/2022    Colorectal cancer screening: No longer required.   Lung Cancer Screening: (Low Dose CT  Chest recommended if Age 93-80 years, 30 pack-year currently smoking OR have quit w/in 15years.) does not qualify.   Additional Screening:  Hepatitis C Screening: does not qualify   Vision Screening: Recommended annual ophthalmology exams for early detection of glaucoma and other disorders of the eye. Is the patient up to date with their annual eye exam?  Yes  Who is the provider or what is the name of the office in which the patient attends annual eye exams? Doesn't remember name at this time If pt is not established with a provider, would they like to be referred to a provider to establish care? No .   Dental Screening: Recommended annual dental exams for proper oral hygiene  Community Resource Referral / Chronic Care Management: CRR required this visit?  No   CCM required this visit?  No      Plan:     I have personally reviewed and noted the following in the patient's chart:   Medical and social history Use of alcohol, tobacco or illicit drugs  Current medications and supplements including opioid prescriptions. Patient is not currently taking opioid prescriptions. Functional ability and status Nutritional status Physical activity Advanced directives List of other physicians Hospitalizations, surgeries, and ER visits in previous 12 months Vitals Screenings to include cognitive, depression, and falls Referrals and appointments  In addition, I have reviewed and discussed with patient certain preventive protocols, quality metrics, and best practice recommendations. A written personalized care plan for preventive services as well as general preventive health recommendations were provided to patient.     Donne Anon, [place]   09/20/2022   Nurse Notes: None

## 2022-09-27 ENCOUNTER — Ambulatory Visit: Payer: Medicare HMO | Admitting: Internal Medicine

## 2022-09-27 ENCOUNTER — Encounter: Payer: Self-pay | Admitting: Internal Medicine

## 2022-09-27 VITALS — BP 120/70 | HR 59 | Ht 64.0 in | Wt 127.0 lb

## 2022-09-27 DIAGNOSIS — E1165 Type 2 diabetes mellitus with hyperglycemia: Secondary | ICD-10-CM

## 2022-09-27 DIAGNOSIS — N1832 Chronic kidney disease, stage 3b: Secondary | ICD-10-CM | POA: Diagnosis not present

## 2022-09-27 DIAGNOSIS — E1122 Type 2 diabetes mellitus with diabetic chronic kidney disease: Secondary | ICD-10-CM | POA: Diagnosis not present

## 2022-09-27 LAB — POCT GLYCOSYLATED HEMOGLOBIN (HGB A1C): Hemoglobin A1C: 13.5 % — AB (ref 4.0–5.6)

## 2022-09-27 LAB — POCT GLUCOSE (DEVICE FOR HOME USE): POC Glucose: 248 mg/dl — AB (ref 70–99)

## 2022-09-27 MED ORDER — GLIPIZIDE 10 MG PO TABS: 10.0000 mg | ORAL_TABLET | Freq: Two times a day (BID) | ORAL | 3 refills | Status: DC

## 2022-09-27 NOTE — Patient Instructions (Addendum)
-   Increase Glipizide 10 mg, 1 tablet Before Breakfast and 1 tablet Before Supper  - Continue Jardiance 25  mg , 1 tablet every morning       HOW TO TREAT LOW BLOOD SUGARS (Blood sugar LESS THAN 70 MG/DL) Please follow the RULE OF 15 for the treatment of hypoglycemia treatment (when your (blood sugars are less than 70 mg/dL)   STEP 1: Take 15 grams of carbohydrates when your blood sugar is low, which includes:  3-4 GLUCOSE TABS  OR 3-4 OZ OF JUICE OR REGULAR SODA OR ONE TUBE OF GLUCOSE GEL    STEP 2: RECHECK blood sugar in 15 MINUTES STEP 3: If your blood sugar is still low at the 15 minute recheck --> then, go back to STEP 1 and treat AGAIN with another 15 grams of carbohydrates.

## 2022-09-27 NOTE — Progress Notes (Signed)
Name: [name]  Age/ Sex: 89 y.o., male   MRN/ DOB: [id number], 1932-03-25     PCP: Zola Button, Grayling Congress, DO   Reason for Endocrinology Evaluation: Type 2 Diabetes Mellitus  Initial Endocrine Consultative Visit: 02/06/2020    PATIENT IDENTIFIER: [name] is a 89 y.o. male with a past medical history of T2DM, CAD and HTN. The patient has followed with Endocrinology clinic since 02/06/2020 for consultative assistance with management of his diabetes.  DIABETIC HISTORY:  [name] was diagnosed with DM in 2008, has been on Glimepiride for years, janumet started in 01/2020. His hemoglobin A1c has ranged from 6.7% in 2017, peaking at 9.6%in 2021.   On initial visit his A1c was 9.6 % , he was just started on Janumet and we did not make any changes and continued Glimepiride.  Discontinued Janumet and started Jardiance only due to low GFR 08/2021    SUBJECTIVE:   During the last visit (08/25/2022): A1c 13.7%.  Today (09/27/2022): [name] is here for a follow up on diabetes management.  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 Excell Seltzer:  pLAD 99%, oCFX 20-30%, pOM1 40%, dAVCFX 20-30%.  EF was normal on nuclear study.  He was treated with a Promus DES to his pLAD.    Colon  polyps 1996   villous adenoma   DM type 2 (diabetes mellitus, type 2) 2002   Fuchs' corneal dystrophy    Hyperlipidemia    Hypertension    Macular degeneration    posterior vitreous vitreous detachment   Prostate cancer dx 2018   Past Surgical History:  Past Surgical History:  Procedure Laterality Date   CARDIAC CATHETERIZATION  03/25/2011   cataract Bilateral 2010   corenea implants  june and sept 2018   dr Molli Hazard [facility]   CORONARY STENT PLACEMENT  03/25/2011   RADIOACTIVE SEED IMPLANT N/A 05/06/2017   Procedure: RADIOACTIVE SEED IMPLANT/BRACHYTHERAPY IMPLANT;  Surgeon: Ihor Gully, MD;  Location: [facility];  Service: Urology;  Laterality: N/A;   SPACE OAR INSTILLATION N/A 05/06/2017   Procedure: SPACE OAR INSTILLATION;  Surgeon: Ihor Gully, MD;  Location: [facility];  Service: Urology;  Laterality: N/A;   Social History:  reports that he quit smoking about 64 years ago. His smoking use included cigarettes. He has a 0.75 pack-year smoking history. He has never used smokeless tobacco. He reports that he does not currently use alcohol. He reports that he does not use drugs. Family History:  Family History  Problem Relation Age of Onset   Stomach cancer Mother 25   Coronary artery disease Father 27       deceased   Healthy Son  Healthy Son    Healthy Son      HOME MEDICATIONS: Allergies as of 09/27/2022   No Known Allergies      Medication List        Accurate as of September 27, 2022  3:41 PM. If you have any questions, ask your nurse or doctor.          acetaminophen 500 MG tablet Commonly known as: TYLENOL Take 2 tablets (1,000 mg total) by mouth every 6 (six) hours as needed.   Alcohol Swabs Pads Use as directed once a day   alendronate 70 MG tablet Commonly known as: FOSAMAX TAKE 1 TABLET ONCE WEEKLY *TAKE WITH A FULL GLASS OF WATER ON AN EMPTY STOMACH*   amLODipine 10 MG tablet Commonly known as: NORVASC Take 1  tablet (10 mg total) by mouth daily.   aspirin 81 MG tablet Take 81 mg by mouth daily.   atorvastatin 20 MG tablet Commonly known as: LIPITOR TAKE 1 TABLET (20 MG TOTAL) BY MOUTH DAILY FOR CHOLESTEROL   brimonidine-timolol 0.2-0.5 % ophthalmic solution Commonly known as: COMBIGAN Place 1 drop into both eyes 2 (two) times daily.   COLLAGEN PO Take by mouth. 1 scoop daily   empagliflozin 25 MG Tabs tablet Commonly known as: Jardiance Take 1 tablet (25 mg total) by mouth daily before breakfast.   fish oil-omega-3 fatty acids 1000 MG capsule Take 2 g by mouth daily.   glipiZIDE 10 MG tablet Commonly known as: GLUCOTROL Take 1 tablet (10 mg total) by mouth 2 (two) times daily before a meal. What changed:  medication strength how much to take Changed by: Scarlette Shorts, MD   losartan-hydrochlorothiazide 100-25 MG tablet Commonly known as: HYZAAR TAKE 1 TABLET BY MOUTH DAILY FOR BLOOD PRESSURE AND KIDNEY PROTECTION   metoprolol succinate 50 MG 24 hr tablet Commonly known as: TOPROL-XL Take 1 tablet (50 mg total) by mouth daily. Take with or immediately following a meal   multivitamin tablet Take 1 tablet by mouth daily.   tamsulosin 0.4 MG Caps capsule Commonly known as: FLOMAX Take 1 capsule (0.4 mg total) by mouth daily after supper.   True Metrix Blood Glucose Test test strip Generic drug: glucose blood USE TO CHECK BLOOD GLUCOSE ONCE A DAY   True Metrix Level 3 High Soln Use to check controls on glucometer strips every 30 days or with each new bottle of strips (whichever comes first).   True Metrix Level 1 Low Soln USE AS DIRECTED   True Metrix Meter w/Device Kit USE AS DIRECTED ONCE A DAY   TRUEplus Lancets 33G Misc Use to check blood sugar once a day.  DX  E11.9         OBJECTIVE:   Vital Signs: BP 120/70 (BP Location: Left Arm, Patient Position: Sitting, Cuff Size: Small)   Pulse (!) 59   Ht  (1.626 m)   Wt 127 lb (57.6 kg)   SpO2  94%   BMI 21.80 kg/m   Wt Readings from Last 3 Encounters:  09/27/22 127 lb (57.6 kg)  09/17/22 123 lb 9.6 oz (56.1 kg)  08/31/22 123 lb (55.8 kg)     Exam: General: Pt appears well and is in NAD  Lungs: Clear with good BS bilat   Heart: RRR   Extremities: No pretibial edema.  Neuro: MS is good with appropriate affect, pt is alert and Ox3   DM foot exam: 08/25/2022 The skin of the feet is without sores or ulcerations.  The pedal pulses are 1+ on right and 1+ on left. The sensation is intact to a screening 5.07, 10 gram monofilament on the right          DATA REVIEWED:  Lab Results  Component Value Date   HGBA1C 13.5 (A) 09/27/2022   HGBA1C 13.7 (H) 08/13/2022   HGBA1C 9.6 (H) 02/01/2022    Latest Reference Range & Units 08/13/22 09:32  Sodium 135 - 145 mEq/L 140  Potassium 3.5 - 5.1 mEq/L 4.2  Chloride 96 - 112 mEq/L 101  CO2 19 - 32 mEq/L 31  Glucose 70 - 99 mg/dL 161 (H)  BUN 6 - 23 mg/dL 13  Creatinine 0.96 - 0.45 mg/dL 4.09  Calcium 8.4 - 81.1 mg/dL 9.0  Alkaline Phosphatase 39 - 117 U/L 122 (H)  Albumin 3.5 - 5.2 g/dL 3.3 (L)  AST 0 - 37 U/L 14  ALT 0 - 53 U/L 14  Total Protein 6.0 - 8.3 g/dL 5.7 (L)  Total Bilirubin 0.2 - 1.2 mg/dL 0.8  GFR >91.47 mL/min 56.82 (L)  Total CHOL/HDL Ratio  4  Cholesterol 0 - 200 mg/dL 829  HDL Cholesterol >56.21 mg/dL 30.86  LDL (calc) 0 - 99 mg/dL 578 (H)  MICROALB/CREAT RATIO 0.0 - 30.0 mg/g 516.6 (H)  NonHDL  141.21  Triglycerides 0.0 - 149.0 mg/dL 469.6  VLDL 0.0 - 29.5 mg/dL 28.4     ASSESSMENT / PLAN / RECOMMENDATIONS:   1) Type 2 Diabetes Mellitus, poorly controlled, with CKD III and neuropathic and macrovascular complications - Most recent A1c of 13.5 %. Goal A1c < 7.5 %.    -Patient continues with persistent hyperglycemia -He is currently living with his son Trey Paula, who has been managing his medications due to the patient's cognitive impairment -Would avoid insulin at this time, as it may cause hardship on  the patient and family, will increase glipizide as below -We switched Janumet to Jardiance due to a GFR of less than 45    MEDICATIONS:  Increase glipizide 10 mg, 1 tablet before breakfast , 1 tablet before supper Continue Jardiance 25 mg daily   EDUCATION / INSTRUCTIONS: BG monitoring instructions: Patient is instructed to check his blood sugars 1 times a day. Call [facility] if: BG persistently < 70  I reviewed the Rule of 15 for the treatment of hypoglycemia in detail with the patient. Literature supplied.    2) Diabetic complications:  Eye: Does not have known diabetic retinopathy.  Neuro/ Feet: Does have known diabetic peripheral neuropathy .  Renal: Patient does  have known baseline CKD. He   is  on an ACEI/ARB at present.    F/U in 3 months    Signed electronically by: Lyndle Herrlich, MD  [facility] End[facility]ology  [facility] [address] [place] [place], [place] [address] Phone: [phone] FAX: [phone]   CC: Virgina Organ [address] [place] [address] Phone: [phone]  Fax: [phone]  Return to Endocrinology clinic as below: Future Appointments  Date Time Provider Department Center  11/03/2022  9:45 AM [facility] None  11/03/2022 10:20 AM Georga Kaufmann T, PA-C [facility] None  11/29/2022  9:00 AM Marcos Eke, PA-C [facility] None  12/27/2022 12:10 PM Anaisa Radi, Konrad Dolores, MD [facility] None  02/14/2023  9:00 AM Donato Schultz, DO [facility]

## 2022-10-06 ENCOUNTER — Other Ambulatory Visit: Payer: Self-pay | Admitting: Internal Medicine

## 2022-10-06 ENCOUNTER — Other Ambulatory Visit: Payer: Self-pay | Admitting: Family Medicine

## 2022-10-06 DIAGNOSIS — I10 Essential (primary) hypertension: Secondary | ICD-10-CM

## 2022-10-25 ENCOUNTER — Other Ambulatory Visit: Payer: Medicare HMO

## 2022-10-25 ENCOUNTER — Ambulatory Visit: Payer: Medicare HMO | Admitting: Hematology and Oncology

## 2022-10-28 DIAGNOSIS — H401112 Primary open-angle glaucoma, right eye, moderate stage: Secondary | ICD-10-CM | POA: Diagnosis not present

## 2022-10-28 DIAGNOSIS — H401121 Primary open-angle glaucoma, left eye, mild stage: Secondary | ICD-10-CM | POA: Diagnosis not present

## 2022-11-02 ENCOUNTER — Other Ambulatory Visit: Payer: Self-pay | Admitting: Physician Assistant

## 2022-11-02 DIAGNOSIS — C61 Malignant neoplasm of prostate: Secondary | ICD-10-CM

## 2022-11-02 DIAGNOSIS — D472 Monoclonal gammopathy: Secondary | ICD-10-CM

## 2022-11-03 ENCOUNTER — Other Ambulatory Visit: Payer: Self-pay | Admitting: Family Medicine

## 2022-11-03 ENCOUNTER — Inpatient Hospital Stay: Payer: Medicare HMO | Attending: Nurse Practitioner

## 2022-11-03 ENCOUNTER — Inpatient Hospital Stay (HOSPITAL_BASED_OUTPATIENT_CLINIC_OR_DEPARTMENT_OTHER): Payer: Medicare HMO | Admitting: Physician Assistant

## 2022-11-03 VITALS — BP 110/65 | HR 70 | Temp 97.5°F | Resp 17 | Ht 64.0 in | Wt 126.1 lb

## 2022-11-03 DIAGNOSIS — C61 Malignant neoplasm of prostate: Secondary | ICD-10-CM

## 2022-11-03 DIAGNOSIS — I10 Essential (primary) hypertension: Secondary | ICD-10-CM

## 2022-11-03 DIAGNOSIS — D472 Monoclonal gammopathy: Secondary | ICD-10-CM

## 2022-11-03 DIAGNOSIS — E1165 Type 2 diabetes mellitus with hyperglycemia: Secondary | ICD-10-CM | POA: Diagnosis not present

## 2022-11-03 LAB — CMP (CANCER CENTER ONLY)
ALT: 16 U/L (ref 0–44)
AST: 11 U/L — ABNORMAL LOW (ref 15–41)
Albumin: 3.7 g/dL (ref 3.5–5.0)
Alkaline Phosphatase: 126 U/L (ref 38–126)
Anion gap: 6 (ref 5–15)
BUN: 30 mg/dL — ABNORMAL HIGH (ref 8–23)
CO2: 30 mmol/L (ref 22–32)
Calcium: 8.6 mg/dL — ABNORMAL LOW (ref 8.9–10.3)
Chloride: 101 mmol/L (ref 98–111)
Creatinine: 1.58 mg/dL — ABNORMAL HIGH (ref 0.61–1.24)
GFR, Estimated: 42 mL/min — ABNORMAL LOW (ref >60)
Glucose, Bld: 434 mg/dL — ABNORMAL HIGH (ref 70–99)
Potassium: 3.8 mmol/L (ref 3.5–5.1)
Sodium: 137 mmol/L (ref 135–145)
Total Bilirubin: 0.7 mg/dL (ref 0.3–1.2)
Total Protein: 6.4 g/dL — ABNORMAL LOW (ref 6.5–8.1)

## 2022-11-03 LAB — CBC WITH DIFFERENTIAL (CANCER CENTER ONLY)
Abs Immature Granulocytes: 0.03 10*3/uL (ref 0.00–0.07)
Basophils Absolute: 0.1 10*3/uL (ref 0.0–0.1)
Basophils Relative: 1 %
Eosinophils Absolute: 0.2 10*3/uL (ref 0.0–0.5)
Eosinophils Relative: 2 %
HCT: 36.3 % — ABNORMAL LOW (ref 39.0–52.0)
Hemoglobin: 12.6 g/dL — ABNORMAL LOW (ref 13.0–17.0)
Immature Granulocytes: 0 %
Lymphocytes Relative: 22 %
Lymphs Abs: 2 10*3/uL (ref 0.7–4.0)
MCH: 30.2 pg (ref 26.0–34.0)
MCHC: 34.7 g/dL (ref 30.0–36.0)
MCV: 87.1 fL (ref 80.0–100.0)
Monocytes Absolute: 0.7 10*3/uL (ref 0.1–1.0)
Monocytes Relative: 8 %
Neutro Abs: 6 10*3/uL (ref 1.7–7.7)
Neutrophils Relative %: 67 %
Platelet Count: 209 10*3/uL (ref 150–400)
RBC: 4.17 MIL/uL — ABNORMAL LOW (ref 4.22–5.81)
RDW: 12.8 % (ref 11.5–15.5)
WBC Count: 9.1 10*3/uL (ref 4.0–10.5)
nRBC: 0 % (ref 0.0–0.2)

## 2022-11-03 NOTE — Progress Notes (Signed)
[facility] [phone]   [phone]  PROGRESS NOTE  Patient Care Team: Zola Button, Grayling Congress, DO as PCP - General (Family Medicine) Ihor Gully, MD (Inactive) as Consulting Physician (Urology) Margaretmary Dys, MD as Consulting Physician (Radiation Oncology) Holli Humbles, MD as Referring Physician (Ophthalmology) Jaci Standard, MD as Consulting Physician (Hematology and Oncology) [facility], Konrad Dolores, MD as Consulting Physician (Endocrinology) Noel Christmas, MD as Consulting Physician (Urology) Bufford Buttner, MD as Consulting Physician (Nephrology) Henrene Pastor, RPH-CPP (Pharmacist) Elwyn Reach (Neurology)  Hematological/Oncological History #Smoldering Multiple Myeloma, Intermediate Risk 1) 02/15/19: found to have ambda chains 645.5, kappa 27.9 with a ratio of 0.04, no serum M protein during nephrology work up.  2) 04/06/19: establish care with Dr. Leonides Schanz 3) 04/06/19: SPEP shows no serum monoclonal protein, however high levels of M protein detected in the urine. B2Microglobulin at 3.3. Bone Survey negative.  4)04/23/19: Bone Marrow biopsy performed, confirmed 10-15% clonal plasma cells (9% in aspirate). Confirmed diagnosis of Smoldering multiple myeloma, intermediate risk. 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 Gleason Score of 3+4 1) 05/06/17: Insertion of radioactive I-125 seeds into the prostate gland with placement of SpaceOAR; 145 Gy, definitive therapy. Radiation oncologist was Dr. Kathrynn Running at [facility] 2) Subsequently followed by outside urology with serial PSA measurements. Reported last measurement in Oct 2020 at PSA 0.2.   Interval History:  [name] 89 y.o. male with medical history significant for smoldering multiple myeloma and prostate cancer presents for a follow up visit. He was last seen on 04/26/2022 for a routine f/u. In the interim since his last visit [name] has had no  changes in medications, no hospitalizations or emergency room visits.  On exam today [name] is accompanied by his son for this visit as he is having some memory loss. [name] report he is staying active and has started his gardening for this year. He is able to complete his ADLs on his own. He has a good appetite and denies any weight changes.  He denies any nausea, vomiting or abdominal pain. His bowel habits are unchanged without any recurrent episodes of diarrhea or constipation. He denies easy bruising or signs of bleeding. He denies fevers, chills, night sweats, shortness of breath, chest pain or cough. He has no other complaints. Rest of the 10 point ROS is below.   MEDICAL HISTORY:  Past Medical History:  Diagnosis Date   CAD (coronary artery disease)    LHC 03/25/11 by Dr. Excell Seltzer:  pLAD 99%, oCFX 20-30%, pOM1 40%, dAVCFX 20-30%.  EF was normal on nuclear study.  He was treated with a Promus DES to his pLAD.    Colon polyps 1996   villous adenoma   DM type 2 (diabetes mellitus, type 2) (HCC) 2002   Fuchs' corneal dystrophy    Hyperlipidemia    Hypertension    Macular degeneration    posterior vitreous vitreous detachment   Prostate cancer (HCC) dx 2018    SURGICAL HISTORY: Past Surgical History:  Procedure Laterality Date   CARDIAC CATHETERIZATION  03/25/2011   cataract Bilateral 2010   corenea implants  june and sept 2018   dr Molli Hazard [facility]   CORONARY STENT PLACEMENT  03/25/2011   RADIOACTIVE SEED IMPLANT N/A 05/06/2017   Procedure: RADIOACTIVE SEED IMPLANT/BRACHYTHERAPY IMPLANT;  Surgeon: Ihor Gully, MD;  Location: [facility];  Service: Urology;  Laterality: N/A;   SPACE OAR INSTILLATION N/A 05/06/2017   Procedure:  SPACE OAR INSTILLATION;  Surgeon: Ihor Gully, MD;  Location: [facility];  Service: Urology;  Laterality: N/A;    SOCIAL HISTORY: Social History   Socioeconomic History   Marital status: Widowed    Spouse name: Not  on file   Number of children: Not on file   Years of education: Not on file   Highest education level: Not on file  Occupational History   Occupation: retired  Tobacco Use   Smoking status: Former    Packs/day: 0.25    Years: 3.00    Additional pack years: 0.00    Total pack years: 0.75    Types: Cigarettes    Quit date: 06/07/1958    Years since quitting: 64.4   Smokeless tobacco: Never  Vaping Use   Vaping Use: Never used  Substance and Sexual Activity   Alcohol use: Not Currently   Drug use: No   Sexual activity: Not Currently  Other Topics Concern   Not on file  Social History Narrative   Right   Lives with son    Retired   Two story home   Caffeine  2 cup a day   Still drives   Social Determinants of Health   Financial Resource Strain: Low Risk  (06/23/2021)   Overall Financial Resource Strain (CARDIA)    Difficulty of Paying Living Expenses: Not hard at all  Food Insecurity: No Food Insecurity (09/17/2022)   Hunger Vital Sign    Worried About Running Out of Food in the Last Year: Never true    Ran Out of Food in the Last Year: Never true  Transportation Needs: No Transportation Needs (09/17/2022)   PRAPARE - Administrator, Civil Service (Medical): No    Lack of Transportation (Non-Medical): No  Physical Activity: Sufficiently Active (06/23/2021)   Exercise Vital Sign    Days of Exercise per Week: 7 days    Minutes of Exercise per Session: 30 min  Stress: No Stress Concern Present (09/15/2021)   [employer] of Occupational Health - Occupational Stress Questionnaire    Feeling of Stress : Not at all  Social Connections: Moderately Integrated (12/18/2020)   Social Connection and Isolation Panel [NHANES]    Frequency of Communication with Friends and Family: More than three times a week    Frequency of Social Gatherings with Friends and Family: Twice a week    Attends Religious Services: More than 4 times per year    Active Member of [employer] or  Organizations: Yes    Attends Banker Meetings: More than 4 times per year    Marital Status: Widowed  Intimate Partner Violence: Not At Risk (09/17/2022)   Humiliation, Afraid, Rape, and Kick questionnaire    Fear of Current or Ex-Partner: No    Emotionally Abused: No    Physically Abused: No    Sexually Abused: No    FAMILY HISTORY: Family History  Problem Relation Age of Onset   Stomach cancer Mother 82   Coronary artery disease Father 46       deceased   Healthy Son    Healthy Son    Healthy Son     ALLERGIES:  has No Known Allergies.  MEDICATIONS:  Current Outpatient Medications  Medication Sig Dispense Refill   acetaminophen (TYLENOL) 500 MG tablet Take 2 tablets (1,000 mg total) by mouth every 6 (six) hours as needed. 60 tablet 0   Alcohol Swabs PADS Use as directed once a day 100 each  1   alendronate (FOSAMAX) 70 MG tablet TAKE 1 TABLET ONCE WEEKLY *TAKE WITH A FULL GLASS OF WATER ON AN EMPTY STOMACH* 12 tablet 0   amLODipine (NORVASC) 10 MG tablet Take 1 tablet (10 mg total) by mouth daily. 90 tablet 3   aspirin 81 MG tablet Take 81 mg by mouth daily.     atorvastatin (LIPITOR) 20 MG tablet TAKE 1 TABLET (20 MG TOTAL) BY MOUTH DAILY FOR CHOLESTEROL 90 tablet 3   Blood Glucose Calibration (TRUE METRIX LEVEL 1) Low SOLN USE AS DIRECTED 1 each 3   Blood Glucose Calibration (TRUE METRIX LEVEL 3) High SOLN Use to check controls on glucometer strips every 30 days or with each new bottle of strips (whichever comes first). 3 each 1   Blood Glucose Monitoring Suppl (TRUE METRIX METER) w/Device KIT USE AS DIRECTED ONCE A DAY 1 kit 0   brimonidine-timolol (COMBIGAN) 0.2-0.5 % ophthalmic solution Place 1 drop into both eyes 2 (two) times daily.     COLLAGEN PO Take by mouth. 1 scoop daily     empagliflozin (JARDIANCE) 25 MG TABS tablet Take 1 tablet (25 mg total) by mouth daily before breakfast. 90 tablet 3   fish oil-omega-3 fatty acids 1000 MG capsule Take 2 g by  mouth daily.     glipiZIDE (GLUCOTROL) 10 MG tablet Take 1 tablet (10 mg total) by mouth 2 (two) times daily before a meal. 180 tablet 3   glucose blood (TRUE METRIX BLOOD GLUCOSE TEST) test strip USE TO CHECK BLOOD GLUCOSE ONCE A DAY 100 strip 12   losartan-hydrochlorothiazide (HYZAAR) 100-25 MG tablet TAKE 1 TABLET BY MOUTH DAILY FOR BLOOD PRESSURE AND KIDNEY PROTECTION 90 tablet 3   metoprolol succinate (TOPROL-XL) 50 MG 24 hr tablet TAKE 1 TABLET EVERY DAY WITH OR IMMEDIATELY FOLLOWING A MEAL 90 tablet 1   Multiple Vitamin (MULTIVITAMIN) tablet Take 1 tablet by mouth daily.     tamsulosin (FLOMAX) 0.4 MG CAPS capsule TAKE 1 CAPSULE EVERY DAY AFTER SUPPER 90 capsule 1   TRUEplus Lancets 33G MISC Use to check blood sugar once a day.  DX  E11.9 100 each 1   No current facility-administered medications for this visit.    REVIEW OF SYSTEMS:   Constitutional: ( - ) fevers, ( - )  chills , ( - ) night sweats Eyes: ( - ) blurriness of vision, ( - ) double vision, ( - ) watery eyes Ears, nose, mouth, throat, and face: ( - ) mucositis, ( - ) sore throat Respiratory: ( - ) cough, ( - ) dyspnea, ( - ) wheezes Cardiovascular: ( - ) palpitation, ( - ) chest discomfort, ( - ) lower extremity swelling Gastrointestinal:  ( - ) nausea, ( - ) heartburn, ( - ) change in bowel habits Skin: ( - ) abnormal skin rashes Lymphatics: ( - ) new lymphadenopathy, ( - ) easy bruising Neurological: ( - ) numbness, ( - ) tingling, ( - ) new weaknesses Behavioral/Psych: ( - ) mood change, ( - ) new changes  All other systems were reviewed with the patient and are negative.  PHYSICAL EXAMINATION: ECOG PERFORMANCE STATUS: 0 - Asymptomatic  Vitals:   11/03/22 1015  BP: 110/65  Pulse: 70  Resp: 17  Temp: (!) 97.5 F (36.4 C)  SpO2: 100%   Filed Weights   11/03/22 1015  Weight: 126 lb 1.6 oz (57.2 kg)    GENERAL: well appearing elderly Caucasian male, alert, no distress and comfortable  SKIN: skin color,  texture, turgor are normal, no rashes or significant lesions EYES: conjunctiva are pink and non-injected, sclera clear LUNGS: clear to auscultation and percussion with normal breathing effort HEART: regular rate & rhythm and no murmurs and no lower extremity edema Musculoskeletal: no cyanosis of digits and no clubbing  PSYCH: alert & oriented x 3, fluent speech NEURO: no focal motor/sensory deficits  LABORATORY DATA:  I have reviewed the data as listed     Latest Ref Rng & Units 08/13/2022    9:32 AM 04/26/2022   10:15 AM 02/01/2022    9:08 AM  CMP  Glucose 70 - 99 mg/dL 161  096  045   BUN 6 - 23 mg/dL 13  24  28    Creatinine 0.40 - 1.50 mg/dL 4.09  8.11  9.14   Sodium 135 - 145 mEq/L 140  135  137   Potassium 3.5 - 5.1 mEq/L 4.2  4.1  4.0   Chloride 96 - 112 mEq/L 101  98  98   CO2 19 - 32 mEq/L 31  30  30    Calcium 8.4 - 10.5 mg/dL 9.0  9.7  9.0   Total Protein 6.0 - 8.3 g/dL 5.7  7.6  6.5   Total Bilirubin 0.2 - 1.2 mg/dL 0.8  0.7  0.5   Alkaline Phos 39 - 117 U/L 122  109  111   AST 0 - 37 U/L 14  15  11    ALT 0 - 53 U/L 14  16  12        Latest Ref Rng & Units 11/03/2022    9:39 AM 08/13/2022    9:32 AM 04/26/2022   10:15 AM  CBC  WBC 4.0 - 10.5 K/uL 9.1  9.4  9.3   Hemoglobin 13.0 - 17.0 g/dL 78.2  95.6  21.3   Hematocrit 39.0 - 52.0 % 36.3  42.4  43.9   Platelets 150 - 400 K/uL 209  223.0  245      Surgical Pathology  CASE: [id number]  PATIENT: [name]  Bone Marrow Report   Clinical History: MGUS, left posterior iliac crest, (ADC)   DIAGNOSIS:   BONE MARROW, ASPIRATE, CLOT, CORE:  - Plasma cell myeloma, see comment.  - No amyloid deposits.  - Minimal iron stores.   PERIPHERAL BLOOD:  - Normocytic anemia.   COMMENT:   The marrow is mildly hypercellular with increased monotypic plasma cells  (9% aspirate, 10-15% CD138 immunohistochemistry). There are no amyloid  deposits seen with Congo red, although there is a lack of larger vessels  in the core  biopsy. The findings are consistent with plasma cell  myeloma.   MICROSCOPIC DESCRIPTION:   PERIPHERAL BLOOD SMEAR: There is a normocytic anemia with scattered  elliptocytes/ovalocytes.  There is no rouleaux formation.  Leukocytes  are present in normal numbers.  Circulating plasma cells are not  identified.  Platelets are present in normal numbers.   BONE MARROW ASPIRATE: Spicular cellular.  Erythroid precursors: Present in appropriate proportions.  No  significant dysplasia.  Granulocytic precursors: Present in appropriate proportions.  No  significant dysplasia.  No increase in blasts.  Megakaryocytes: Present and largely unremarkable.  Lymphocytes/plasma cells: There is a mild increase in plasma cells (9%  by manual differential counts) with scattered atypical forms (large  forms, binucleation).  Lymphocytes are not increased.   TOUCH PREPARATIONS: Similar to aspirate smears.   CLOT AND BIOPSY: The core biopsies and clot sections are mildly  hypercellular for  age (20 to 30%).  CD138 immunohistochemistry reveals  increased plasma cells (10 to 15%) scattered in small clusters.  By  light chain in situ hybridization plasma cells are lambda restricted.  Myeloid and erythroid elements are present in appropriate proportions.  Megakaryocytes exhibit a spectrum of maturation without tight clusters.  There are few benign appearing lymphoid aggregates. Congo red is  negative for amyloid deposits.   IRON STAIN: Iron stains are performed on a bone marrow aspirate or touch  imprint smear and section of clot. The controls stained appropriately.        Storage Iron: Minimal.       Ring Sideroblasts: Absent.   ADDITIONAL DATA/TESTING: Cytogenetics, including FISH for myeloma, was  ordered and will be reported separately.   CELL COUNT DATA:   Bone Marrow count performed on 500 cells shows:  Blasts:   0%   Myeloid:  47%  Promyelocytes: 0%   Erythroid:     23%  Myelocytes:    5%    Lymphocytes:   17%  Metamyelocytes:     0%   Plasma cells:  9%  Bands:    16%  Neutrophils:   22%  M:E ratio:     2.04  Eosinophils:   4%  Basophils:     0%  Monocytes:     4%   Lab Data: CBC performed on 04/23/2019 shows:  WBC: 8.6 k/uL  Neutrophils:   71%  Hgb: 12.4 g/dL Lymphocytes:   09%  HCT: 38.0 %    Monocytes:     3%  MCV: 92.7 fL   Eosinophils:   0%  RDW: 13.8 %    Basophils:     1%  PLT: 221 k/uL   GROSS DESCRIPTION:   A: Aspirate smear   B: The specimen is received in B-plus fixative and consists of a 21.0 x  12.0 x 5.0 mm aggregate of red-brown clotted blood.  The specimen is  entirely submitted in cassette B.   C: The specimen is received in B-plus fixative and consists of 2 cores  of tan bone, measuring 0.6 and 0.9 cm in length by 0.2 cm in diameter.  The specimen is entirely submitted in cassette C. Lovey Newcomer 04/25/2019)   Final Diagnosis performed by Valinda Hoar, MD.   Electronically signed  04/25/2019  Technical and / or Professional components performed at [facility], [address] [place], [place] [address].   Immunohistochemistry Technical component (if applicable) was performed  at [facility]. [address],  [place], [place] [address].   IMMUNOHISTOCHEMISTRY DISCLAIMER (if applicable):  Some of these immunohistochemical stains may have been developed and the  performance characteristics determine by [facility]. Some  may not have been cleared or approved by the U.S. Food and Drug  Administration. The FDA has determined that such clearance or approval  is not necessary. This test is used for clinical purposes. It should not  be regarded as investigational or for research. This laboratory is  certified under the Clinical Laboratory Improvement Amendments of 1988  (CLIA-88) as qualified to perform high complexity clinical laboratory  testing.  The controls stained appropriately.    RADIOGRAPHIC STUDIES: No relevant radiographic studies.   ASSESSMENT & PLAN GLENIS MICHALAK is a 89 y.o. male with medical history significant for CAD s/p PCI in 2012, DM type II, Prostate cancer s/p brachytherapy, and HTN who presents for follow up for his smoldering multiple myeloma.   #  Smoldering Multiple Myeloma, Intermediate Risk --confirmed diagnosis based on the bone marrow biopsy, with plasma cells of 10-15% and no CRAB criteria. No evidence of amyloidosis on bone marrow stain.  --treatment of Smoldering multiple myeloma is a newer idea and predominately consists of monotherapy lenalidomide. Using the [facility] 2018 20/2/20 guidelines, [name] is a borderline Intermediate Risk SMM.  --given his advance aged and lack of any CRAB criteria, I would recommend holding on treatment at this time with close clinical monitoring for progression. [name] was in agreement with close continued monitoring.  --will collect SPEP, UPEP, and SFLC on a routine basis, q 6 months.  -- will order metastatic survey yearly PLAN: --labs today show Cr 1.58, WBC 9.1, Hgb 12.6, MCV 87.1, Plt 209. SPEP and sFLC pending from today. --most recent bone met survey from 05/04/2022 didn't show any evidence of lytic lesions or pathologic fractures. Next one due around November 2024.  --RTC in 6 months time unless remaining labs require any intervention.   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 Gleason Score of 3+4 --[name] is s/p definitive radioactive seed placement in Nov 2018 --continue to f/u with outside urology group for routine PSA monitoring, though if we are following Mr. Styles for a monoclonal gammopathy this is something we can monitor as well.  - PSA today's pending.   #Hyperglycemia: --Glucose is 434, patient report that he didn't take his Jardiance this morning and ate cereal.  --Current regimen for his diabetes includes Jardiance and Glipizide.  --Advised to go home and take his jardiance and recheck  his glucose levels. Additionally, he needs to follow up with his PCP to further evaluate and made any modifications to his regimen.   All questions were answered. The patient knows to call the clinic with any problems, questions or concerns.  I have spent a total of 30 minutes minutes of face-to-face and non-face-to-face time, preparing to see the patient,  performing a medically appropriate examination, counseling and educating the patient, ordering tests/procedures, communicating with other health care professionals, documenting clinical information in the electronic health record,  and care coordination.   Georga Kaufmann PA-C Dept of Hematology and Oncology [facility] Phone: [phone]   11/03/2022 10:25 AM  Literature Support:  Durward Fortes RZ, Gracy Bruins AM, Buadi FK, Christen Bame, Matous JV, [place] DM, [place], Mahindra A, Wagner LI, Dhodapkar MV, Rajkumar SV. Randomized Trial of Lenalidomide Versus Observation in Smoldering Multiple Myeloma. J Clin Oncol. 2020 Apr 10;38(11):1126-1137. doi: 10.1200/JCO.19.01740. Epub 2019 Oct 25. PMID: 09811914; PMCID: [id number]. --Progression-free survival was significantly longer with lenalidomide compared with observation (hazard ratio, 0.28; 95% CI, 0.12 to 0.62; P = .002). One-, 2-, and 3-year progression-free survival was 98%, 93%, and 91% for the lenalidomide arm versus 89%, 76%, and 66% for the observation arm, respectively.  Lakshman, A., Rajkumar, S.V., Buadi, F.K. et al. Risk stratification of smoldering multiple myeloma incorporating revised IMWG diagnostic criteria. Blood Cancer Journal 8, 59 (2018). [email or web address] --The median TTP for low-, intermediate-, and high-risk groups were 110, 68, and 29 months, respectively (p?<?0.0001). BMPC%?>?20%, M-protein?>?2?g/dL, and QMVH?>?84 at diagnosis can be used to risk stratify  patients with SMM. Patients with high-risk SMM need close follow-up and are candidates for clinical trials aiming to prevent progression.

## 2022-11-04 ENCOUNTER — Other Ambulatory Visit: Payer: Self-pay | Admitting: *Deleted

## 2022-11-04 DIAGNOSIS — D472 Monoclonal gammopathy: Secondary | ICD-10-CM | POA: Diagnosis not present

## 2022-11-04 DIAGNOSIS — E1165 Type 2 diabetes mellitus with hyperglycemia: Secondary | ICD-10-CM | POA: Diagnosis not present

## 2022-11-04 DIAGNOSIS — I10 Essential (primary) hypertension: Secondary | ICD-10-CM | POA: Diagnosis not present

## 2022-11-04 DIAGNOSIS — C61 Malignant neoplasm of prostate: Secondary | ICD-10-CM | POA: Diagnosis not present

## 2022-11-04 LAB — KAPPA/LAMBDA LIGHT CHAINS
Kappa free light chain: 35.6 mg/L — ABNORMAL HIGH (ref 3.3–19.4)
Kappa, lambda light chain ratio: 0.06 — ABNORMAL LOW (ref 0.26–1.65)
Lambda free light chains: 609.1 mg/L — ABNORMAL HIGH (ref 5.7–26.3)

## 2022-11-04 LAB — PROSTATE-SPECIFIC AG, SERUM (LABCORP): Prostate Specific Ag, Serum: <0.1 ng/mL (ref 0.0–4.0)

## 2022-11-08 LAB — UPEP/UIFE/LIGHT CHAINS/TP, 24-HR UR
% BETA, Urine: 10.1 %
ALPHA 1 URINE: 1.7 %
Albumin, U: 65.1 %
Alpha 2, Urine: 4.4 %
Free Kappa Lt Chains,Ur: 64.39 mg/L (ref 1.17–86.46)
Free Kappa/Lambda Ratio: 0.1 — ABNORMAL LOW (ref 1.83–14.26)
Free Lambda Lt Chains,Ur: 618.28 mg/L — ABNORMAL HIGH (ref 0.27–15.21)
GAMMA GLOBULIN URINE: 18.7 %
M-SPIKE %, Urine: 10.7 % — ABNORMAL HIGH
M-Spike, Mg/24 Hr: 107 mg/24 hr — ABNORMAL HIGH
Total Protein, Urine-Ur/day: 1002 mg/24 hr — ABNORMAL HIGH (ref 30–150)
Total Protein, Urine: 95.4 mg/dL
Total Volume: 1050

## 2022-11-11 LAB — MULTIPLE MYELOMA PANEL, SERUM
Albumin SerPl Elph-Mcnc: 3.3 g/dL (ref 2.9–4.4)
Albumin/Glob SerPl: 1.3 (ref 0.7–1.7)
Alpha 1: 0.2 g/dL (ref 0.0–0.4)
Alpha2 Glob SerPl Elph-Mcnc: 0.8 g/dL (ref 0.4–1.0)
B-Globulin SerPl Elph-Mcnc: 0.9 g/dL (ref 0.7–1.3)
Gamma Glob SerPl Elph-Mcnc: 0.7 g/dL (ref 0.4–1.8)
Globulin, Total: 2.7 g/dL (ref 2.2–3.9)
IgA: 167 mg/dL (ref 61–437)
IgG (Immunoglobin G), Serum: 849 mg/dL (ref 603–1613)
IgM (Immunoglobulin M), Srm: 64 mg/dL (ref 15–143)
Total Protein ELP: 6 g/dL (ref 6.0–8.5)

## 2022-11-18 ENCOUNTER — Telehealth: Payer: Self-pay

## 2022-11-18 NOTE — Telephone Encounter (Signed)
-----   Message from Briant Cedar, PA-C sent at 11/18/2022  1:43 PM EDT ----- Please notify patient that labs are stable and we will continue to monitor.    ----- Message ----- From: Interface, Lab In Sunquest Sent: 11/03/2022  10:03 AM EDT To: Briant Cedar, PA-C

## 2022-11-18 NOTE — Telephone Encounter (Signed)
LM for pt with lab results and recommendations. 

## 2022-11-29 ENCOUNTER — Ambulatory Visit: Payer: Medicare HMO | Admitting: Physician Assistant

## 2022-11-29 ENCOUNTER — Encounter: Payer: Self-pay | Admitting: Physician Assistant

## 2022-11-29 VITALS — BP 138/63 | HR 53 | Resp 20 | Ht 64.0 in | Wt 125.0 lb

## 2022-11-29 DIAGNOSIS — R413 Other amnesia: Secondary | ICD-10-CM | POA: Diagnosis not present

## 2022-11-29 MED ORDER — MEMANTINE HCL 5 MG PO TABS: 5.0000 mg | ORAL_TABLET | Freq: Every evening | ORAL | 11 refills | Status: DC

## 2022-11-29 NOTE — Patient Instructions (Signed)
It was a pleasure to see you today at our office.   Recommendations:  Follow up in 6 months Neuropssychological testing  Start Memantine 5 mg at night     Whom to call:  Memory  decline, memory medications: Call our office [phone]   For psychiatric meds, mood meds: Please have your primary care physician manage these medications.   Counseling regarding caregiver distress, including caregiver depression, anxiety and issues regarding community resources, adult day care programs, adult living facilities, or memory care questions:   Feel free to contact Misty Lisabeth Register, Social Worker at [phone]   For assessment of decision of mental capacity and competency:  Call Dr. Erick Blinks, geriatric psychiatrist at [phone]  For guidance in geriatric dementia issues please call [facility] [phone]  For guidance regarding [facility] and if placement were needed at the facility, contact Sidney Ace, Social Worker tel: [phone]  If you have any severe symptoms of a stroke, or other severe issues such as confusion,severe chills or fever, etc call 911 or go to the ER as you may need to be evaluated further    You have been referred for a neuropsychological evaluation (i.e., evaluation of memory and thinking abilities). Please bring someone with you to this appointment if possible, as it is helpful for the doctor to hear from both you and another adult who knows you well. Please bring eyeglasses and hearing aids if you wear them.    The evaluation will take approximately 3 hours and has two parts:   The first part is a clinical interview with the neuropsychologist (Dr. Milbert Coulter or Dr. Roseanne Reno). During the interview, the neuropsychologist will speak with you and the individual you brought to the appointment.    The second part of the evaluation is testing with the doctor's technician Annabelle Harman or Selena Batten). During the testing, the technician will  ask you to remember different types of material, solve problems, and answer some questionnaires. Your family member will not be present for this portion of the evaluation.   Please note: We must reserve several hours of the neuropsychologist's time and the psychometrician's time for your evaluation appointment. As such, there is a No-Show fee of $100. If you are unable to attend any of your appointments, please contact our office as soon as possible to reschedule.      RECOMMENDATIONS FOR ALL PATIENTS WITH MEMORY PROBLEMS: 1. Continue to exercise (Recommend 30 minutes of walking everyday, or 3 hours every week) 2. Increase social interactions - continue going to [place] and enjoy social gatherings with friends and family 3. Eat healthy, avoid fried foods and eat more fruits and vegetables 4. Maintain adequate blood pressure, blood sugar, and blood cholesterol level. Reducing the risk of stroke and cardiovascular disease also helps promoting better memory. 5. Avoid stressful situations. Live a simple life and avoid aggravations. Organize your time and prepare for the next day in anticipation. 6. Sleep well, avoid any interruptions of sleep and avoid any distractions in the bedroom that may interfere with adequate sleep quality 7. Avoid sugar, avoid sweets as there is a strong link between excessive sugar intake, diabetes, and cognitive impairment We discussed the Mediterranean diet, which has been shown to help patients reduce the risk of progressive memory disorders and reduces cardiovascular risk. This includes eating fish, eat fruits and green leafy vegetables, nuts like almonds and hazelnuts, walnuts, and also use olive oil. Avoid fast foods and fried foods as much as possible. Avoid sweets  and sugar as sugar use has been linked to worsening of memory function.  There is always a concern of gradual progression of memory problems. If this is the case, then we may need to adjust level of care  according to patient needs. Support, both to the patient and caregiver, should then be put into place.    FALL PRECAUTIONS: Be cautious when walking. Scan the area for obstacles that may increase the risk of trips and falls. When getting up in the mornings, sit up at the edge of the bed for a few minutes before getting out of bed. Consider elevating the bed at the head end to avoid drop of blood pressure when getting up. Walk always in a well-lit room (use night lights in the walls). Avoid area rugs or power cords from appliances in the middle of the walkways. Use a walker or a cane if necessary and consider physical therapy for balance exercise. Get your eyesight checked regularly.  FINANCIAL OVERSIGHT: Supervision, especially oversight when making financial decisions or transactions is also recommended.  HOME SAFETY: Consider the safety of the kitchen when operating appliances like stoves, microwave oven, and blender. Consider having supervision and share cooking responsibilities until no longer able to participate in those. Accidents with firearms and other hazards in the house should be identified and addressed as well.   ABILITY TO BE LEFT ALONE: If patient is unable to contact 911 operator, consider using [facility], or when the need is there, arrange for someone to stay with patients. Smoking is a fire hazard, consider supervision or cessation. Risk of wandering should be assessed by caregiver and if detected at any point, supervision and safe proof recommendations should be instituted.  MEDICATION SUPERVISION: Inability to self-administer medication needs to be constantly addressed. Implement a mechanism to ensure safe administration of the medications.   DRIVING: Regarding driving, in patients with progressive memory problems, driving will be impaired. We advise to have someone else do the driving if trouble finding directions or if minor accidents are reported. Independent driving assessment  is available to determine safety of driving.   If you are interested in the driving assessment, you can contact the following:  [employer] in [place] [phone]  [facility] [phone]  [facility] [phone]  [facility] [phone] or [phone]

## 2022-11-29 NOTE — Progress Notes (Signed)
Assessment/Plan:   Memory Impairment  [name] is a very pleasant 89 y.o. RH malewith a history of hypertension, hyperlipidemia, bilateral carotid artery stenosis, DM2, smoldering multiple myeloma, CKD stage IIIb, osteoporosis, BPH, history of prostate cancer, anemia of chronic disease, monoclonal gammopathy, glaucoma, vitamin D deficiency, CAD seen today in follow up for memory loss.  Memory is stable since his last visit.  MRI of the brain was without acute findings, moderate volume loss, minimal chronic microvascular changes.  He is able to participate in all activities of daily living, and continues to drive short distances.  Discussed with the patient and his son starting a low-dose of antidementia medication, and both are interested in doing so.  Unfortunately for the patient, he has a history of bradycardia which limits the selection of antidementia meds.   .    Follow up in 6 months. Neuropsych evaluation for clarity of the diagnosis Start memantine 5 mg nightly, side effects discussed (history of bradycardia). Recommend good control of her cardiovascular risk factors Continue to control mood as per PCP    Subjective:    This patient is accompanied in the office by his son who supplements the history.  Previous records as well as any outside records available were reviewed prior to todays visit. Patient was last seen on 08/31/2022 with [facility] 16/30   Any changes in memory since last visit? "I am doing better after 15 minutes sugars".  Continues to have difficulty remembering recent conversations and people's names.  He forgets within minutes, especially appointments and simple tasks.  He attends a senior center and remains active. repeats oneself?  Endorsed Disoriented when walking into a room?  Patient denies    Leaving objects in unusual places?  May misplace things but not in unusual places   Wandering behavior?  denies   Any personality changes since last visit?  denies    Any worsening depression?:  denies.  "I have a good circle of friends. Hallucinations or paranoia?  denies   Seizures? denies    Any sleep changes?  Denies vivid dreams, REM behavior or sleepwalking   Sleep apnea?   denies   Any hygiene concerns? denies  Independent of bathing and dressing?  Endorsed  Does the patient needs help with medications?  Son is in charge   Who is in charge of the finances?  Patient is in charge with sons' supervision     Any changes in appetite?  denies     Patient have trouble swallowing? denies   Does the patient cook? No Any headaches?   denies   Chronic back pain  denies   Ambulates with difficulty? denies.  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 Not since February when he had a mechanical fall while gardening without LOC or head injury, only residual right sided low back pain Unilateral weakness, numbness or tingling? denies   Any tremors?  denies   Any anosmia?  Patient denies   Any incontinence of urine?  Endorsed has urgency. Wears diapers Any bowel dysfunction?     denies      Patient lives with his son  Does the patient drive?  Only short distances to [employer], friends homes, grocery store.    Initial visit 08/31/2022 How long did patient have memory difficulties? "For the last 2 year he was losing his wallet, having difficulty remembering recent conversations and people names ". "Since his fall in January, his memory worsened, forgetting within  minutes, not knowing where the money is, date, remembering appointments and simple tasks". "When I asked him Who is your MD, he opened the medicine cabinet to find out the name"  repeats oneself?  Endorsed, for the last 1.5 years, worse recently  Disoriented when walking into a room?  Patient denies except occasionally not remembering what patient came to the room for    Leaving objects in unusual places?  "he does misplace things".   Wandering behavior? denies   Any  personality changes since last visit? denies   Any history of depression?:  denies   Hallucinations or paranoia?  denies   Seizures? denies    Any sleep changes?  Denies vivid dreams, REM behavior or sleepwalking . Takes Mg to help him relax Sleep apnea? denies   Any hygiene concerns?  denies   Independent of bathing and dressing?  Endorsed  Does the patient need help with medications?  Son is in charge because he was not taking medications correctly  Who is in charge of the finances?   Patient is in charge but sons are involved after some confusion with the bills    Any changes in appetite? "It is ok" " Eats in a restaurant several times a week . Seldom he cooks a breakfast " Patient have trouble swallowing?  denies   Does the patient cook?seldom  Any kitchen accidents such as leaving the stove on? Patient denies   Any headaches?  denies   Chronic back pain?  denies   Ambulates with difficulty? 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  Endorsed, last February he fell while gardening, mechanical, with some residual right-sided low back pain, no loss of consciousness, no head injury. Vision changes? Denies, had corneal implants. Unilateral weakness, numbness or tingling?  denies   Any tremors?  denies   Any anosmia?  denies   Any incontinence of urine? For the last 5 months he has more urgency.  Any bowel dysfunction?    denies      Patient lives with his son   History of heavy alcohol intake? denies   History of heavy tobacco use? denies   Family history of dementia?  No Does patient drive?  Short distances to [place], his friend home, grocery shop, but may have gotten lost going to the dentist    PREVIOUS MEDICATIONS:   CURRENT MEDICATIONS:  Outpatient Encounter Medications as of 11/29/2022  Medication Sig   acetaminophen (TYLENOL) 500 MG tablet Take 2 tablets (1,000 mg total) by mouth every 6 (six) hours as needed.   Alcohol Swabs PADS Use as  directed once a day   alendronate (FOSAMAX) 70 MG tablet TAKE 1 TABLET ONCE WEEKLY *TAKE WITH A FULL GLASS OF WATER ON AN EMPTY STOMACH*   amLODipine (NORVASC) 10 MG tablet Take 1 tablet (10 mg total) by mouth daily.   amLODipine (NORVASC) 5 MG tablet TAKE 1.5 TABLETS BY MOUTH DAILY.   aspirin 81 MG tablet Take 81 mg by mouth daily.   atorvastatin (LIPITOR) 20 MG tablet TAKE 1 TABLET (20 MG TOTAL) BY MOUTH DAILY FOR CHOLESTEROL   Blood Glucose Calibration (TRUE METRIX LEVEL 1) Low SOLN USE AS DIRECTED   Blood Glucose Calibration (TRUE METRIX LEVEL 3) High SOLN Use to check controls on glucometer strips every 30 days or with each new bottle of strips (whichever comes first).   Blood Glucose Monitoring Suppl (TRUE METRIX METER) w/Device KIT USE AS DIRECTED ONCE A  DAY   brimonidine-timolol (COMBIGAN) 0.2-0.5 % ophthalmic solution Place 1 drop into both eyes 2 (two) times daily.   COLLAGEN PO Take by mouth. 1 scoop daily   empagliflozin (JARDIANCE) 25 MG TABS tablet Take 1 tablet (25 mg total) by mouth daily before breakfast.   fish oil-omega-3 fatty acids 1000 MG capsule Take 2 g by mouth daily.   glipiZIDE (GLUCOTROL) 10 MG tablet Take 1 tablet (10 mg total) by mouth 2 (two) times daily before a meal.   glucose blood (TRUE METRIX BLOOD GLUCOSE TEST) test strip USE TO CHECK BLOOD GLUCOSE ONCE A DAY   losartan-hydrochlorothiazide (HYZAAR) 100-25 MG tablet TAKE 1 TABLET BY MOUTH DAILY FOR BLOOD PRESSURE AND KIDNEY PROTECTION   memantine (NAMENDA) 5 MG tablet Take 1 tablet (5 mg total) by mouth at bedtime.   metoprolol succinate (TOPROL-XL) 50 MG 24 hr tablet TAKE 1 TABLET EVERY DAY WITH OR IMMEDIATELY FOLLOWING A MEAL   Multiple Vitamin (MULTIVITAMIN) tablet Take 1 tablet by mouth daily.   tamsulosin (FLOMAX) 0.4 MG CAPS capsule TAKE 1 CAPSULE EVERY DAY AFTER SUPPER   TRUEplus Lancets 33G MISC Use to check blood sugar once a day.  DX  E11.9   No facility-administered encounter medications on file as  of 11/29/2022.        No data to display            08/31/2022   10:00 AM  Montreal Cognitive Assessment   Visuospatial/ Executive (0/5) 3  Naming (0/3) 3  Attention: Read list of digits (0/2) 1  Attention: Read list of letters (0/1) 1  Attention: Serial 7 subtraction starting at 100 (0/3) 1  Language: Repeat phrase (0/2) 0  Language : Fluency (0/1) 0  Abstraction (0/2) 2  Delayed Recall (0/5) 0  Orientation (0/6) 5  Total 16  Adjusted Score (based on education) 16    Objective:     PHYSICAL EXAMINATION:    VITALS:   Vitals:   11/29/22 0848  BP: 138/63  Pulse: (!) 53  Resp: 20  SpO2: 97%  Weight: 125 lb (56.7 kg)  Height: 5\' 4"  (1.626 m)    GEN:  The patient appears stated age and is in NAD. HEENT:  Normocephalic, atraumatic.   Neurological examination:  General: NAD, well-groomed, appears stated age. Orientation: The patient is alert. Oriented to person, place and date Cranial nerves: There is good facial symmetry.The speech is fluent and clear. No aphasia or dysarthria. Fund of knowledge is appropriate. Recent and remote memory are impaired. Attention and concentration are reduced.  Able to name objects and repeat phrases.  Hearing is intact to conversational tone.   Sensation: Sensation is intact to light touch throughout Motor: Strength is at least antigravity x4. DTR's 2/4 in UE/LE     Movement examination: Tone: There is normal tone in the UE/LE Abnormal movements:  no tremor.  No myoclonus.  No asterixis.   Coordination:  There is no decremation with RAM's. Normal finger to nose  Gait and Station: The patient has no difficulty arising out of a deep-seated chair without the use of the hands. The patient's stride length is good.  Gait is cautious and narrow.    Thank you for allowing [place] the opportunity to participate in the care of this nice patient. Please do not hesitate to contact us for any questions or concerns.   Total time spent on today's  visit was 25 minutes dedicated to this patient today, preparing to see patient, examining the  patient, ordering tests and/or medications and counseling the patient, documenting clinical information in the EHR or other health record, independently interpreting results and communicating results to the patient/family, discussing treatment and goals, answering patient's questions and coordinating care.  Cc:  Zola Button, Grayling Congress, DO  [facility] 11/29/2022 10:01 AM

## 2022-12-13 DIAGNOSIS — H401112 Primary open-angle glaucoma, right eye, moderate stage: Secondary | ICD-10-CM | POA: Diagnosis not present

## 2022-12-13 DIAGNOSIS — H401121 Primary open-angle glaucoma, left eye, mild stage: Secondary | ICD-10-CM | POA: Diagnosis not present

## 2022-12-27 ENCOUNTER — Encounter: Payer: Self-pay | Admitting: Internal Medicine

## 2022-12-27 ENCOUNTER — Ambulatory Visit: Payer: Medicare HMO | Admitting: Internal Medicine

## 2022-12-27 VITALS — BP 126/70 | HR 56 | Ht 64.0 in | Wt 124.0 lb

## 2022-12-27 DIAGNOSIS — Z7984 Long term (current) use of oral hypoglycemic drugs: Secondary | ICD-10-CM | POA: Diagnosis not present

## 2022-12-27 DIAGNOSIS — E1165 Type 2 diabetes mellitus with hyperglycemia: Secondary | ICD-10-CM | POA: Diagnosis not present

## 2022-12-27 DIAGNOSIS — Z794 Long term (current) use of insulin: Secondary | ICD-10-CM

## 2022-12-27 DIAGNOSIS — N1832 Chronic kidney disease, stage 3b: Secondary | ICD-10-CM

## 2022-12-27 DIAGNOSIS — E1122 Type 2 diabetes mellitus with diabetic chronic kidney disease: Secondary | ICD-10-CM

## 2022-12-27 LAB — POCT GLYCOSYLATED HEMOGLOBIN (HGB A1C): Hemoglobin A1C: 12.9 % — AB (ref 4.0–5.6)

## 2022-12-27 MED ORDER — INSULIN PEN NEEDLE 32G X 4 MM MISC
1.0000 | Freq: Every day | 3 refills | Status: DC
Start: 2022-12-27 — End: 2023-08-12

## 2022-12-27 MED ORDER — LANTUS SOLOSTAR 100 UNIT/ML ~~LOC~~ SOPN: 10.0000 [IU] | PEN_INJECTOR | Freq: Every day | SUBCUTANEOUS | 11 refills | Status: DC

## 2022-12-27 NOTE — Patient Instructions (Addendum)
-   Stop Glipizide  - Continue Jardiance 25  mg , 1 tablet every morning  - Start lantus 10 units daily       HOW TO TREAT LOW BLOOD SUGARS (Blood sugar LESS THAN 70 MG/DL) Please follow the RULE OF 15 for the treatment of hypoglycemia treatment (when your (blood sugars are less than 70 mg/dL)   STEP 1: Take 15 grams of carbohydrates when your blood sugar is low, which includes:  3-4 GLUCOSE TABS  OR 3-4 OZ OF JUICE OR REGULAR SODA OR ONE TUBE OF GLUCOSE GEL    STEP 2: RECHECK blood sugar in 15 MINUTES STEP 3: If your blood sugar is still low at the 15 minute recheck --> then, go back to STEP 1 and treat AGAIN with another 15 grams of carbohydrates.

## 2022-12-27 NOTE — Progress Notes (Signed)
Name: [name]  Age/ Sex: 89 y.o., male   MRN/ DOB: [id number], Feb 12, 1932     PCP: Zola Button, Grayling Congress, DO   Reason for Endocrinology Evaluation: Type 2 Diabetes Mellitus  Initial Endocrine Consultative Visit: 02/06/2020    PATIENT IDENTIFIER: [name] is a 89 y.o. male with a past medical history of T2DM, CAD and HTN. The patient has followed with Endocrinology clinic since 02/06/2020 for consultative assistance with management of his diabetes.  DIABETIC HISTORY:  Mr. Mayall was diagnosed with DM in 2008, has been on Glimepiride for years, janumet started in 01/2020. His hemoglobin A1c has ranged from 6.7% in 2017, peaking at 9.6%in 2021.   On initial visit his A1c was 9.6 % , he was just started on Janumet and we did not make any changes and continued Glimepiride.  Discontinued Janumet and started Jardiance only due to low GFR 08/2021    SUBJECTIVE:   During the last visit (08/25/2022): A1c 13.7%.  Today (12/27/2022): [name] is here for a follow up on diabetes management.  He is accompanied by his son [name]  Patient lives with his son [name], who has been managing his medications.  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  Excell Seltzer:  pLAD 99%, oCFX 20-30%, pOM1 40%, dAVCFX 20-30%.  EF was normal on nuclear study.  He was treated with a Promus DES to his pLAD.    Colon polyps 1996   villous adenoma   DM type 2 (diabetes mellitus, type 2) (HCC) 2002   Fuchs' corneal dystrophy    Hyperlipidemia    Hypertension    Macular degeneration    posterior vitreous vitreous detachment   Prostate cancer (HCC) dx 2018   Past Surgical History:  Past Surgical History:  Procedure Laterality Date   CARDIAC CATHETERIZATION  03/25/2011   cataract Bilateral 2010   corenea implants  june and sept 2018   dr Molli [facility]   CORONARY STENT PLACEMENT  03/25/2011   RADIOACTIVE SEED IMPLANT N/A 05/06/2017   Procedure: RADIOACTIVE SEED IMPLANT/BRACHYTHERAPY IMPLANT;  Surgeon: Ihor Gully, MD;  Location: [facility];  Service: Urology;  Laterality: N/A;   SPACE OAR INSTILLATION N/A 05/06/2017   Procedure: SPACE OAR INSTILLATION;  Surgeon: Ihor Gully, MD;  Location: [facility];  Service: Urology;  Laterality: N/A;   Social History:  reports that he quit smoking about 64 years ago. His smoking use included cigarettes. He started smoking about 67 years ago. He has a 0.8 pack-year smoking history. He has never used smokeless tobacco. He reports that he does not currently use alcohol. He reports that he does not use drugs. Family History:  Family History  Problem  Relation Age of Onset   Stomach cancer Mother 86   Coronary artery disease Father 71       deceased   Healthy Son    Healthy Son    Healthy Son      HOME MEDICATIONS: Allergies as of 12/27/2022   No Known Allergies      Medication List        Accurate as of December 27, 2022 12:51 PM. If you have any questions, ask your nurse or doctor.          acetaminophen 500 MG tablet Commonly known as: TYLENOL Take 2 tablets (1,000 mg total) by mouth every 6 (six) hours as needed.   Alcohol Swabs Pads Use as directed once a day    alendronate 70 MG tablet Commonly known as: FOSAMAX TAKE 1 TABLET ONCE WEEKLY *TAKE WITH A FULL GLASS OF WATER ON AN EMPTY STOMACH*   amLODipine 10 MG tablet Commonly known as: NORVASC Take 1 tablet (10 mg total) by mouth daily.   amLODipine 5 MG tablet Commonly known as: NORVASC TAKE 1.5 TABLETS BY MOUTH DAILY.   aspirin 81 MG tablet Take 81 mg by mouth daily.   atorvastatin 20 MG tablet Commonly known as: LIPITOR TAKE 1 TABLET (20 MG TOTAL) BY MOUTH DAILY FOR CHOLESTEROL   brimonidine-timolol 0.2-0.5 % ophthalmic solution Commonly known as: COMBIGAN Place 1 drop into both eyes 2 (two) times daily.   COLLAGEN PO Take by mouth. 1 scoop daily   empagliflozin 25 MG Tabs tablet Commonly known as: Jardiance Take 1 tablet (25 mg total) by mouth daily before breakfast.   fish oil-omega-3 fatty acids 1000 MG capsule Take 2 g by mouth daily.   glipiZIDE 10 MG tablet Commonly known as: GLUCOTROL Take 1 tablet (10 mg total) by mouth 2 (two) times daily before a meal.   Insulin Pen Needle 32G X 4 MM Misc 1 Device by Does not apply route daily in the afternoon. Started by: Johnney Ou Rykker Coviello   Lantus SoloStar 100 UNIT/ML Solostar Pen Generic drug: insulin glargine Inject 10 Units into the skin daily. Started by: Johnney Ou Aitan Rossbach   losartan-hydrochlorothiazide 100-25 MG tablet Commonly known as: HYZAAR TAKE 1 TABLET BY MOUTH DAILY FOR BLOOD PRESSURE AND KIDNEY PROTECTION   memantine 5 MG tablet Commonly known as: NAMENDA Take 1 tablet (5 mg total) by mouth at bedtime.   metoprolol succinate 50 MG 24 hr tablet Commonly known as: TOPROL-XL TAKE 1 TABLET EVERY DAY WITH OR IMMEDIATELY FOLLOWING A MEAL   multivitamin tablet Take 1 tablet by mouth daily.   tamsulosin 0.4 MG Caps capsule Commonly known as: FLOMAX TAKE 1 CAPSULE EVERY DAY AFTER SUPPER   True Metrix Blood Glucose Test test strip Generic drug: glucose blood USE TO CHECK BLOOD GLUCOSE ONCE A DAY    True Metrix Level 3 High Soln Use to check controls on glucometer strips every 30 days or with each new bottle of strips (whichever comes first).   True Metrix Level 1 Low Soln USE AS DIRECTED   True Metrix Meter w/Device Kit USE AS DIRECTED ONCE A DAY   TRUEplus Lancets 33G Misc Use to check blood sugar once a day.  DX  E11.9         OBJECTIVE:   Vital Signs: BP 126/70 (BP Location: Left Arm, Patient Position: Sitting, Cuff Size: Small)   Pulse (!) 56   Ht 5\' 4"  (1.626 m)   Wt 124 lb (56.2 kg)   SpO2 94%  BMI 21.28 kg/m   Wt Readings from Last 3 Encounters:  12/27/22 124 lb (56.2 kg)  11/29/22 125 lb (56.7 kg)  11/03/22 126 lb 1.6 oz (57.2 kg)     Exam: General: Pt appears well and is in NAD  Lungs: Clear with good BS bilat   Heart: RRR   Extremities: No pretibial edema.  Neuro: MS is good with appropriate affect, pt is alert and Ox3   DM foot exam: 08/25/2022 The skin of the feet is without sores or ulcerations. The pedal pulses are 1+ on right and 1+ on left. The sensation is intact to a screening 5.07, 10 gram monofilament on the right          DATA REVIEWED:  Lab Results  Component Value Date   HGBA1C 12.9 (A) 12/27/2022   HGBA1C 13.5 (A) 09/27/2022   HGBA1C 13.7 (H) 08/13/2022     Latest Reference Range & Units 11/03/22 09:39  Sodium 135 - 145 mmol/L 137  Potassium 3.5 - 5.1 mmol/L 3.8  Chloride 98 - 111 mmol/L 101  CO2 22 - 32 mmol/L 30  Glucose 70 - 99 mg/dL 540 (H)  BUN 8 - 23 mg/dL 30 (H)  Creatinine 9.81 - 1.24 mg/dL 1.91 (H)  Calcium 8.9 - 10.3 mg/dL 8.6 (L)  Anion gap 5 - 15  6  Alkaline Phosphatase 38 - 126 U/L 126  Albumin 3.5 - 5.0 g/dL 3.7  AST 15 - 41 U/L 11 (L)  ALT 0 - 44 U/L 16  Total Protein 6.5 - 8.1 g/dL 6.4 (L)  Total Bilirubin 0.3 - 1.2 mg/dL 0.7  GFR, Est Non African American >60 mL/min 42 (L)  (H): Data is abnormally high (L): Data is abnormally low   ASSESSMENT / PLAN / RECOMMENDATIONS:   1) Type 2  Diabetes Mellitus, poorly controlled, with CKD III and neuropathic and macrovascular complications - Most recent A1c of 12.9 %. Goal A1c < 7.5 %.    -Unfortunately, patient continues with severe hyperglycemia BG > 300 mg/dL, despite doubling up on glipizide. -He is currently living with his son [name], who has been managing his medications due to the patient's cognitive impairment -I have been trying to avoid insulin at this advanced age, but unfortunately and due to persistent severe hyperglycemia, I have recommended insulin.  Cautioned against hypoglycemia. -Patient to stop glipizide at this time, due to high risk of hypoglycemia with combination of glipizide and insulin -He was educated by my assistant on insulin pen use -I have encouraged the patient and his son to contact our office if BG's remain over 200 mg/DL after a week of insulin use or hypoglycemia, to make the appropriate adjustments -We switched Janumet to Jardiance due to a GFR of less than 45 -Medical alert bracelet is an option   MEDICATIONS:  Stop glipizide Start Lantus 10 units daily Continue Jardiance 25 mg daily   EDUCATION / INSTRUCTIONS: BG monitoring instructions: Patient is instructed to check his blood sugars 1 times a day. Call [facility] if: BG persistently < 70  I reviewed the Rule of 15 for the treatment of hypoglycemia in detail with the patient. Literature supplied.    2) Diabetic complications:  Eye: Does not have known diabetic retinopathy.  Neuro/ Feet: Does have known diabetic peripheral neuropathy .  Renal: Patient does  have known baseline CKD. He   is  on an ACEI/ARB at present.    F/U in 3 months    Signed electronically by: Deloria Lair  Raelyn Mora, MD  [facility]  [facility] [address] [place], [place] [address] Phone: [phone] FAX: [phone]   CC: Donato Schultz, DO [address] [place]  [address] Phone: [phone]  Fax: [phone]  Return to Endocrinology clinic as below: Future Appointments  Date Time Provider Department Center  02/14/2023  9:00 AM Laury Axon Irish Elders, DO [facility]  05/02/2023  8:15 AM [facility] LAB [facility] None  05/02/2023  8:40 AM Jaci Standard, MD [facility] None  06/06/2023  9:00 AM Marcos Eke, PA-C [facility] None  06/20/2023  8:50 AM Jaleigh Mccroskey, Konrad Dolores, MD [facility] None  09/13/2023  8:30 AM Rosann Auerbach, PhD [facility] None  09/13/2023  9:30 AM [facility] NEUROPSYCH TECH [facility] None  09/20/2023 10:00 AM Rosann Auerbach, PhD [facility] None

## 2023-01-20 ENCOUNTER — Encounter (INDEPENDENT_AMBULATORY_CARE_PROVIDER_SITE_OTHER): Payer: Self-pay

## 2023-01-27 ENCOUNTER — Encounter: Payer: Self-pay | Admitting: Internal Medicine

## 2023-02-02 ENCOUNTER — Other Ambulatory Visit: Payer: Self-pay | Admitting: Family

## 2023-02-02 DIAGNOSIS — I10 Essential (primary) hypertension: Secondary | ICD-10-CM

## 2023-02-05 ENCOUNTER — Other Ambulatory Visit: Payer: Self-pay | Admitting: Internal Medicine

## 2023-02-05 DIAGNOSIS — M80032D Age-related osteoporosis with current pathological fracture, left forearm, subsequent encounter for fracture with routine healing: Secondary | ICD-10-CM

## 2023-02-14 ENCOUNTER — Encounter: Payer: Self-pay | Admitting: Family Medicine

## 2023-02-14 ENCOUNTER — Ambulatory Visit (INDEPENDENT_AMBULATORY_CARE_PROVIDER_SITE_OTHER): Payer: Medicare HMO | Admitting: Family Medicine

## 2023-02-14 VITALS — BP 120/76 | HR 57 | Temp 97.6°F | Resp 18 | Ht 64.0 in | Wt 124.4 lb

## 2023-02-14 DIAGNOSIS — I10 Essential (primary) hypertension: Secondary | ICD-10-CM | POA: Diagnosis not present

## 2023-02-14 DIAGNOSIS — M810 Age-related osteoporosis without current pathological fracture: Secondary | ICD-10-CM | POA: Diagnosis not present

## 2023-02-14 DIAGNOSIS — E785 Hyperlipidemia, unspecified: Secondary | ICD-10-CM | POA: Diagnosis not present

## 2023-02-14 DIAGNOSIS — E1169 Type 2 diabetes mellitus with other specified complication: Secondary | ICD-10-CM

## 2023-02-14 DIAGNOSIS — E1165 Type 2 diabetes mellitus with hyperglycemia: Secondary | ICD-10-CM | POA: Diagnosis not present

## 2023-02-14 DIAGNOSIS — Z794 Long term (current) use of insulin: Secondary | ICD-10-CM | POA: Diagnosis not present

## 2023-02-14 LAB — COMPREHENSIVE METABOLIC PANEL
ALT: 14 U/L (ref 0–53)
AST: 12 U/L (ref 0–37)
Albumin: 3.7 g/dL (ref 3.5–5.2)
Alkaline Phosphatase: 106 U/L (ref 39–117)
BUN: 26 mg/dL — ABNORMAL HIGH (ref 6–23)
CO2: 33 meq/L — ABNORMAL HIGH (ref 19–32)
Calcium: 9.3 mg/dL (ref 8.4–10.5)
Chloride: 103 meq/L (ref 96–112)
Creatinine, Ser: 1.49 mg/dL (ref 0.40–1.50)
GFR: 41.49 mL/min — ABNORMAL LOW (ref >60.00)
Glucose, Bld: 97 mg/dL (ref 70–99)
Potassium: 3.7 meq/L (ref 3.5–5.1)
Sodium: 144 meq/L (ref 135–145)
Total Bilirubin: 0.6 mg/dL (ref 0.2–1.2)
Total Protein: 6.9 g/dL (ref 6.0–8.3)

## 2023-02-14 LAB — VITAMIN D 25 HYDROXY (VIT D DEFICIENCY, FRACTURES): VITD: 37.61 ng/mL (ref 30.00–100.00)

## 2023-02-14 LAB — LIPID PANEL
Cholesterol: 134 mg/dL (ref 0–200)
HDL: 38.9 mg/dL — ABNORMAL LOW (ref >39.00)
LDL Cholesterol: 66 mg/dL (ref 0–99)
NonHDL: 94.79
Total CHOL/HDL Ratio: 3
Triglycerides: 146 mg/dL (ref 0.0–149.0)
VLDL: 29.2 mg/dL (ref 0.0–40.0)

## 2023-02-14 NOTE — Progress Notes (Signed)
Established Patient Office Visit  Subjective   Patient ID: [name], male    DOB: Oct 09, 1931  Age: 89 y.o. MRN: [id number]  Chief Complaint  Patient presents with   Hypertension   Hyperlipidemia   Follow-up    HPI Discussed the use of AI scribe software for clinical note transcription with the patient, who gave verbal consent to proceed.  History of Present Illness   The patient, a runner with a stable weight of 125 lbs since high school, presents with a history of high blood sugar. Despite being on Lantus insulin for about a week, the patient's blood sugar levels remain in the upper twos to low threes. The patient does not experience any physical symptoms from the high blood sugar levels and continues to maintain an active lifestyle, including participation in the senior Olympics. The patient also has a history of osteopenia, which has reportedly improved to osteoporosis. The patient is currently on Fosamax for this condition.      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   DM (diabetes mellitus) type II, controlled, with peripheral vascular disorder (HCC) 08/13/2022   Type 2 diabetes mellitus with hyperglycemia, without long-term current use of insulin (HCC) 11/10/2021   Vitamin D deficiency 01/28/2021   Diabetes mellitus (HCC) 04/15/2020   Type 2 diabetes mellitus with stage 3b chronic kidney disease, without long-term current use of insulin (HCC) 02/06/2020   Type 2 diabetes mellitus with diabetic polyneuropathy, without long-term current use of insulin (HCC) 02/06/2020   Uncontrolled type 2 diabetes mellitus with hyperglycemia (HCC) 04/06/2019   Hyperlipidemia associated with type 2 diabetes mellitus (HCC) 04/06/2019   Monoclonal gammopathy 04/06/2019   Osteoporosis with pathological fracture of forearm 03/21/2019    Multiple closed fractures of ribs of left side 03/21/2019   Primary open angle glaucoma (POAG) of left eye, mild stage 02/04/2019   Primary open angle glaucoma (POAG) of right eye, moderate stage 02/04/2019   Preventative health care 04/01/2017   Prostate cancer (HCC) 02/15/2017   Fuchs' corneal dystrophy 08/18/2016   BPH with urinary obstruction 12/13/2012   CAD (coronary artery disease) 04/09/2011   Gastritis and gastroduodenitis 05/25/2010   COLONIC POLYPS, ADENOMATOUS, HX OF 04/20/2010   ANEMIA 10/27/2009   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, without long-term current use of insulin (HCC) 07/04/2006   Hyperlipidemia 07/04/2006   Essential hypertension 07/04/2006   Past Medical History:  Diagnosis Date   CAD (coronary artery disease)    LHC 03/25/11 by Dr. Excell Seltzer:  pLAD 99%, oCFX 20-30%, pOM1 40%, dAVCFX 20-30%.  EF was normal on nuclear study.  He was treated with a Promus DES to his pLAD.    Colon polyps 1996   villous adenoma   DM type 2 (diabetes mellitus, type 2) (HCC) 2002   Fuchs' corneal dystrophy    Hyperlipidemia    Hypertension    Macular degeneration    posterior vitreous vitreous detachment   Prostate cancer (HCC) dx 2018   Past Surgical History:  Procedure Laterality Date   CARDIAC CATHETERIZATION  03/25/2011   cataract Bilateral 2010   corenea implants  june and sept 2018   dr Molli [facility]   CORONARY STENT PLACEMENT  03/25/2011   RADIOACTIVE SEED IMPLANT N/A 05/06/2017   Procedure: RADIOACTIVE SEED IMPLANT/BRACHYTHERAPY IMPLANT;  Surgeon: Ihor Gully, MD;  Location: [facility];  Service: Urology;  Laterality: N/A;   SPACE OAR INSTILLATION N/A 05/06/2017   Procedure: SPACE OAR INSTILLATION;  Surgeon: Ihor Gully, MD;  Location: [facility];  Service: Urology;  Laterality: N/A;   Social History   Tobacco Use   Smoking status: Former    Current packs/day: 0.00    Average packs/day: 0.3 packs/day  for 3.0 years (0.8 ttl pk-yrs)    Types: Cigarettes    Start date: 06/08/1955    Quit date: 06/07/1958    Years since quitting: 64.7   Smokeless tobacco: Never  Vaping Use   Vaping status: Never Used  Substance Use Topics   Alcohol use: Not Currently   Drug use: No   Social History   Socioeconomic History   Marital status: Widowed    Spouse name: Not on file   Number of children: Not on file   Years of education: Not on file   Highest education level: Not on file  Occupational History   Occupation: retired  Tobacco Use   Smoking status: Former    Current packs/day: 0.00    Average packs/day: 0.3 packs/day for 3.0 years (0.8 ttl pk-yrs)    Types: Cigarettes    Start date: 06/08/1955    Quit date: 06/07/1958    Years since quitting: 64.7   Smokeless tobacco: Never  Vaping Use   Vaping status: Never Used  Substance and Sexual Activity   Alcohol use: Not Currently   Drug use: No   Sexual activity: Not Currently  Other Topics Concern   Not on file  Social History Narrative   Right   Lives with son    Retired   Two story home   Caffeine  2 cup a day   Still drives   Social Determinants of Health   Financial Resource Strain: Low Risk  (06/23/2021)   Overall Financial Resource Strain (CARDIA)    Difficulty of Paying Living Expenses: Not hard at all  Food Insecurity: No Food Insecurity (09/17/2022)   Hunger Vital Sign    Worried About Running Out of Food in the Last Year: Never true    Ran Out of Food in the Last Year: Never true  Transportation Needs: No Transportation Needs (09/17/2022)   PRAPARE - Administrator, Civil Service (Medical): No    Lack of Transportation (Non-Medical): No  Physical Activity: Sufficiently Active (06/23/2021)   Exercise Vital Sign    Days of Exercise per Week: 7 days    Minutes of Exercise per Session: 30 min  Stress: No Stress Concern Present (09/15/2021)   [employer] of Occupational Health - Occupational Stress Questionnaire     Feeling of Stress : Not at all  Social Connections: Moderately Integrated (12/18/2020)   Social Connection and Isolation Panel [NHANES]    Frequency of Communication with Friends and Family: More than three times a week    Frequency of Social Gatherings with Friends and Family: Twice a week    Attends Religious Services: More than 4 times per year    Active Member of [employer] or Organizations: Yes    Attends Banker Meetings: More than 4 times per year    Marital Status: Widowed  Intimate Partner Violence: Not At Risk (09/17/2022)   Humiliation, Afraid, Rape, and Kick questionnaire    Fear of Current or Ex-Partner: No    Emotionally Abused: No    Physically Abused: No    Sexually Abused: No   Family Status  Relation Name Status   Mother  Deceased   Father  Deceased   Sister  Alive   Sister  Alive   Brother  Deceased at age 11   Son  Alive   Son  Alive   Son  Alive  No partnership data on file   Family History  Problem Relation Age of Onset   Stomach cancer Mother 45   Coronary artery disease Father 28       deceased   Healthy Son    Healthy Son    Healthy Son    No Known Allergies    Review of Systems  Constitutional:  Negative for fever.  HENT:  Negative for congestion.   Eyes:  Negative for blurred vision.  Respiratory:  Negative for cough.   Cardiovascular:  Negative for chest pain and palpitations.  Gastrointestinal:  Negative for vomiting.  Musculoskeletal:  Negative for back pain.  Skin:  Negative for rash.  Neurological:  Negative for loss of consciousness and headaches.      Objective:     BP 120/76 (BP Location: Left Arm, Patient Position: Sitting, Cuff Size: Normal)   Pulse (!) 57   Temp 97.6 F (36.4 C) (Oral)   Resp 18   Ht 5\' 4"  (1.626 m)   Wt 124 lb 6.4 oz (56.4 kg)   SpO2 95%   BMI 21.35 kg/m  BP Readings from Last 3 Encounters:  02/14/23 120/76  12/27/22 126/70  11/29/22 138/63   Wt Readings from Last 3 Encounters:   02/14/23 124 lb 6.4 oz (56.4 kg)  12/27/22 124 lb (56.2 kg)  11/29/22 125 lb (56.7 kg)   SpO2 Readings from Last 3 Encounters:  02/14/23 95%  12/27/22 94%  11/29/22 97%      Physical Exam Vitals and nursing note reviewed.  Constitutional:      General: He is not in acute distress.    Appearance: Normal appearance. He is well-developed.  HENT:     Head: Normocephalic and atraumatic.  Eyes:     General: No scleral icterus.       Right eye: No discharge.        Left eye: No discharge.  Cardiovascular:     Rate and Rhythm: Normal rate and regular rhythm.     Heart sounds: No murmur heard. Pulmonary:     Effort: Pulmonary effort is normal. No respiratory distress.     Breath sounds: Normal breath sounds.  Musculoskeletal:        General: Normal range of motion.     Cervical back: Normal range of motion and neck supple.     Right lower leg: No edema.     Left lower leg: No edema.  Skin:    General: Skin is warm and dry.  Neurological:     Mental Status: He is alert and oriented to person, place, and time.  Psychiatric:        Mood and Affect: Mood normal.        Behavior: Behavior normal.        Thought Content: Thought content normal.        Judgment: Judgment normal.      No results found for any visits on 02/14/23.  Last CBC Lab Results  Component Value Date   WBC 9.1 11/03/2022   HGB 12.6 (L) 11/03/2022   HCT 36.3 (L) 11/03/2022   MCV 87.1 11/03/2022   MCH 30.2 11/03/2022   RDW 12.8 11/03/2022   PLT 209 11/03/2022  Last metabolic panel Lab Results  Component Value Date   GLUCOSE 434 (H) 11/03/2022   NA 137 11/03/2022   K 3.8 11/03/2022   CL 101 11/03/2022   CO2 30 11/03/2022   BUN 30 (H) 11/03/2022   CREATININE 1.58 (H) 11/03/2022   GFRNONAA 42 (L) 11/03/2022   CALCIUM 8.6 (L) 11/03/2022   PROT 6.4 (L) 11/03/2022   ALBUMIN 3.7 11/03/2022   LABGLOB 2.7 11/03/2022   BILITOT 0.7 11/03/2022   ALKPHOS 126 11/03/2022   AST 11 (L) 11/03/2022    ALT 16 11/03/2022   ANIONGAP 6 11/03/2022   Last lipids Lab Results  Component Value Date   CHOL 189 08/13/2022   HDL 48.20 08/13/2022   LDLCALC 117 (H) 08/13/2022   TRIG 122.0 08/13/2022   CHOLHDL 4 08/13/2022   Last hemoglobin A1c Lab Results  Component Value Date   HGBA1C 12.9 (A) 12/27/2022   Last thyroid functions Lab Results  Component Value Date   TSH 3.58 08/13/2022   Last vitamin D Lab Results  Component Value Date   VD25OH 80 01/28/2021   Last vitamin B12 and Folate Lab Results  Component Value Date   VITAMINB12 359 08/13/2022   FOLATE >20.0 ng/mL 03/04/2010      The ASCVD Risk score (Arnett DK, et al., 2019) failed to calculate for the following reasons:   The 2019 ASCVD risk score is only valid for ages 86 to 31    Assessment & Plan:   Problem List Items Addressed This Visit       Unprioritized   Type 2 diabetes mellitus with hyperglycemia, without long-term current use of insulin (HCC)   Hyperlipidemia associated with type 2 diabetes mellitus (HCC)   Relevant Orders   Comprehensive metabolic panel   Lipid panel   Other Visit Diagnoses     Age-related osteoporosis without current pathological fracture    -  Primary   Relevant Orders   DG Bone Density   VITAMIN D 25 Hydroxy (Vit-D Deficiency, Fractures)   Primary hypertension         Assessment and Plan    Diabetes Mellitus High blood sugars in the upper 200s to low 300s. Recently started on Lantus insulin. No symptoms of hyperglycemia reported. Patient is physically active and maintains a stable weight. -Continue Lantus insulin as prescribed by Dr. Domingo Pulse. -Consider Continuous Glucose Monitoring (CGM) system for better blood sugar monitoring. Discuss with Dr. Domingo Pulse.  Osteopenia Improvement noted from previous diagnosis of osteoporosis. Currently on Fosamax. -Schedule bone density scan at patient's convenience to monitor progress.  General Health Maintenance -Flu vaccine  administered on January 31, 2023. -Follow-up in six months.        No follow-ups on file.    Donato Schultz, DO

## 2023-02-14 NOTE — Patient Instructions (Signed)

## 2023-02-21 ENCOUNTER — Encounter: Payer: Self-pay | Admitting: Psychology

## 2023-02-21 DIAGNOSIS — H3530 Unspecified macular degeneration: Secondary | ICD-10-CM | POA: Insufficient documentation

## 2023-02-22 ENCOUNTER — Ambulatory Visit: Payer: Medicare HMO | Admitting: Psychology

## 2023-02-22 ENCOUNTER — Ambulatory Visit: Payer: Self-pay

## 2023-02-22 ENCOUNTER — Encounter: Payer: Self-pay | Admitting: Psychology

## 2023-02-22 DIAGNOSIS — G309 Alzheimer's disease, unspecified: Secondary | ICD-10-CM

## 2023-02-22 DIAGNOSIS — F0280 Dementia in other diseases classified elsewhere without behavioral disturbance: Secondary | ICD-10-CM | POA: Diagnosis not present

## 2023-02-22 DIAGNOSIS — R4189 Other symptoms and signs involving cognitive functions and awareness: Secondary | ICD-10-CM

## 2023-02-22 HISTORY — DX: Dementia in other diseases classified elsewhere, unspecified severity, without behavioral disturbance, psychotic disturbance, mood disturbance, and anxiety: F02.80

## 2023-02-22 NOTE — Progress Notes (Unsigned)
NEUROPSYCHOLOGICAL EVALUATION [place]. [facility] Department of Neurology  Date of Evaluation: February 22, 2023  Reason for Referral:   [name] is a 89 y.o. right-handed Caucasian male referred by Donald Kays, PA-C, to characterize his current cognitive functioning and assist with diagnostic clarity and treatment planning in the context of subjective cognitive decline.   Assessment and Plan:   Clinical Impression(s): [name] pattern of performance is suggestive of severe impairment surrounding all aspects of learning and memory. An additional impairment was exhibited across semantic fluency, while performance variability was exhibited across processing speed and executive functioning. Performances were appropriate relative to age-matched peers across attention/concentration, safety/judgment, receptive language, phonemic fluency, confrontation naming, and visuospatial abilities. Functionally, [name] family has fully taken over medication management due to adherence concerns and have expressed further concern surrounding financial management. Given the degree of cognitive dysfunction and the likelihood that it is directly interfering with day-to-day functioning, I believe that [name] best meets diagnostic criteria for a Major Neurocognitive Disorder ("dementia"). He is likely towards the milder end of this spectrum presently.   Regarding the cause of his mild dementia presentation, I do have concerns surrounding a neurodegenerative illness, namely Alzheimer's disease. Across memory testing, [name] did not benefit from repeated information across learning trials and was fully amnestic (i.e., 0% retention) across both verbal memory tasks after a brief delay. He only exhibited 13% retention across the remaining visual memory task. Performances across yes/no recognition trials were consistently poor. Taken together, this suggests evidence for rapid forgetting and a  pronounced storage impairment, both of which are the hallmark testing patterns associated with this illness. Further impairment surrounding semantic fluency and executive variability would represent typical disease progression. Intact confrontation naming is encouraging. However, concerns surrounding a dementia due to Alzheimer's disease presentation remain.   His most recent brain MRI revealed only mild microvascular ischemia, making a vascular dementia presentation very unlikely. He also does not display behavioral or testing characteristics to warrant more advanced concerns surrounding Lewy body dementia, another more rare parkinsonian presentation, or frontotemporal dementia. Continued medical monitoring will be important moving forward.   Recommendations: [name] has already been prescribed a medication aimed to address memory loss and concerns surrounding Alzheimer's disease (i.e., memantine/Namenda). He is encouraged to continue taking this medication as prescribed. It is important to highlight that this medication has been shown to slow functional decline in some individuals. There is no current treatment which can stop or reverse cognitive decline when caused by a neurodegenerative illness.   Performance across neurocognitive testing is not a strong predictor of an individual's safety operating a motor vehicle. Should his family wish to pursue a formalized driving evaluation, they could reach out to the following agencies: [employer] in [place]: [phone] [facility]: [phone] [facility]: [phone] [facility]: [phone] or [phone]  It will be important for [name] to have another person with him when in situations where he may need to process information, weigh the pros and cons of different options, and make decisions, in order to ensure that he fully understands and recalls all information to be considered.  If not  already done, [name] and his family may want to discuss his wishes regarding durable power of attorney and medical decision making, so that he can have input into these choices. If they require legal assistance with this, long-term care resource access, or other aspects of estate planning, they could reach out  to [facility] at [phone] for a free consultation. Additionally, they may wish to discuss future plans for caretaking and seek out community options for in home/residential care should they become necessary.  [name] is encouraged to attend to lifestyle factors for brain health (e.g., regular physical exercise, good nutrition habits and consideration of the MIND-DASH diet, regular participation in cognitively-stimulating activities, and general stress management techniques), which are likely to have benefits for both emotional adjustment and cognition. Optimal control of vascular risk factors (including safe cardiovascular exercise and adherence to dietary recommendations) is encouraged. Continued participation in activities which provide mental stimulation and social interaction is also recommended.   Important information should be provided to [name] in written format in all instances. This information should be placed in a highly frequented and easily visible location within his home to promote recall. External strategies such as written notes in a consistently used memory journal, visual and nonverbal auditory cues such as a calendar on the refrigerator or appointments with alarm, such as on a cell phone, can also help maximize recall.  To address problems with processing speed, he may wish to consider:   -Ensuring that he is alerted when essential material or instructions are being presented   -Adjusting the speed at which new information is presented   -Allowing for more time in comprehending, processing, and responding in conversation   -Repeating and paraphrasing instructions  or conversations aloud  To address problems with fluctuating attention and/or executive dysfunction, he may wish to consider:   -Avoiding external distractions when needing to concentrate   -Limiting exposure to fast paced environments with multiple sensory demands   -Writing down complicated information and using checklists   -Attempting and completing one task at a time (i.e., no multi-tasking)   -Verbalizing aloud each step of a task to maintain focus   -Taking frequent breaks during the completion of steps/tasks to avoid fatigue   -Reducing the amount of information considered at one time   -Scheduling more difficult activities for a time of day where he is usually most alert  Review of Records:   [name] was seen by [facility] Donald Kays, PA-C) on 08/31/2022 for an evaluation of memory loss. At that time, family described progressive short-term memory dysfunction. Examples included misplacing objects and trouble recalling names and details of recent conversations. Functionally, one of his sons have taken over medication management, while his sons provide some assistance with financial management and bill paying. He continues to drive locally without issue. Performance on a brief cognitive screening instrument (MOCA) was 16/30. Ultimately, [name] was referred for a comprehensive neuropsychological evaluation to characterize his cognitive abilities and to assist with diagnostic clarity and treatment planning.   Neuroimaging Brain MRI on 08/24/2022 revealed moderate parenchymal volume loss and mild microvascular ischemic disease.   Past Medical History:  Diagnosis Date   Anemia, unspecified 10/27/2009   BPH with urinary obstruction 12/13/2012   CAD (coronary artery disease)    LHC 03/25/11 by Dr. Excell Seltzer:  pLAD 99%, oCFX 20-30%, pOM1 40%, dAVCFX 20-30%.  EF was normal on nuclear study.  He was treated with a Promus DES to his pLAD.    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   Macular degeneration    posterior vitreous vitreous detachment   Monoclonal  gammopathy 04/06/2019   Multiple closed fractures of ribs of left side 03/21/2019   Multiple myeloma not having achieved remission 08/13/2022   Osteoporosis 03/21/2019   Peripheral vascular disorder due to diabetes mellitus 08/13/2022   Personal history of colonic polyps 1996   villous adenoma   Primary open angle glaucoma (POAG) of left eye, mild stage 02/04/2019   Primary open angle glaucoma (POAG) of right eye, moderate stage 02/04/2019   Prostate cancer 02/15/2017   Stage 3b chronic kidney disease 02/06/2020   Type II diabetes mellitus 07/04/2006   Vitamin D deficiency 01/28/2021    Past Surgical History:  Procedure Laterality Date   CARDIAC CATHETERIZATION  03/25/2011   cataract Bilateral 2010   corenea implants  june and sept 2018   dr Molli [facility]   CORONARY STENT PLACEMENT  03/25/2011   RADIOACTIVE SEED IMPLANT N/A 05/06/2017   Procedure: RADIOACTIVE SEED IMPLANT/BRACHYTHERAPY IMPLANT;  Surgeon: Ihor Gully, MD;  Location: [facility];  Service: Urology;  Laterality: N/A;   SPACE OAR INSTILLATION N/A 05/06/2017   Procedure: SPACE OAR INSTILLATION;  Surgeon: Ihor Gully, MD;  Location: [facility];  Service: Urology;  Laterality: N/A;    Current Outpatient Medications:    acetaminophen (TYLENOL) 500 MG tablet, Take 2 tablets (1,000 mg total) by mouth every 6 (six) hours as needed., Disp: 60 tablet, Rfl: 0   Alcohol Swabs PADS, Use as directed once a day, Disp: 100 each, Rfl: 1   alendronate (FOSAMAX) 70 MG tablet, TAKE 1 TABLET ONCE WEEKLY *TAKE WITH A FULL GLASS OF WATER ON AN EMPTY STOMACH*, Disp: 12 tablet, Rfl: 0   amLODipine (NORVASC) 5 MG  tablet, TAKE 1.5 TABLETS BY MOUTH DAILY., Disp: 135 tablet, Rfl: 0   aspirin 81 MG tablet, Take 81 mg by mouth daily., Disp: , Rfl:    atorvastatin (LIPITOR) 20 MG tablet, TAKE 1 TABLET (20 MG TOTAL) BY MOUTH DAILY FOR CHOLESTEROL, Disp: 90 tablet, Rfl: 3   Blood Glucose Calibration (TRUE METRIX LEVEL 1) Low SOLN, USE AS DIRECTED, Disp: 1 each, Rfl: 3   Blood Glucose Calibration (TRUE METRIX LEVEL 3) High SOLN, Use to check controls on glucometer strips every 30 days or with each new bottle of strips (whichever comes first)., Disp: 3 each, Rfl: 1   Blood Glucose Monitoring Suppl (TRUE METRIX METER) w/Device KIT, USE AS DIRECTED ONCE A DAY, Disp: 1 kit, Rfl: 0   brimonidine-timolol (COMBIGAN) 0.2-0.5 % ophthalmic solution, Place 1 drop into both eyes 2 (two) times daily., Disp: , Rfl:    COLLAGEN PO, Take by mouth. 1 scoop daily, Disp: , Rfl:    empagliflozin (JARDIANCE) 25 MG TABS tablet, Take 1 tablet (25 mg total) by mouth daily before breakfast., Disp: 90 tablet, Rfl: 3   fish oil-omega-3 fatty acids 1000 MG capsule, Take 2 g by mouth daily., Disp: , Rfl:    glucose blood (TRUE METRIX BLOOD GLUCOSE TEST) test strip, USE TO CHECK BLOOD GLUCOSE ONCE A DAY, Disp: 100 strip, Rfl: 12   insulin glargine (LANTUS SOLOSTAR) 100 UNIT/ML Solostar Pen, Inject 10 Units into the skin daily., Disp: 15 mL, Rfl: 11   Insulin Pen Needle 32G X 4 MM MISC, 1 Device by Does not apply route daily in the afternoon., Disp: 100 each, Rfl: 3   losartan-hydrochlorothiazide (HYZAAR) 100-25 MG tablet, TAKE 1 TABLET BY MOUTH DAILY FOR BLOOD PRESSURE AND KIDNEY PROTECTION, Disp: 90 tablet, Rfl: 3   memantine (NAMENDA) 5 MG tablet, Take 1 tablet (  5 mg total) by mouth at bedtime., Disp: 30 tablet, Rfl: 11   metoprolol succinate (TOPROL-XL) 50 MG 24 hr tablet, TAKE 1 TABLET EVERY DAY WITH OR IMMEDIATELY FOLLOWING A MEAL, Disp: 90 tablet, Rfl: 1   Multiple Vitamin (MULTIVITAMIN) tablet, Take 1 tablet by mouth daily., Disp: , Rfl:     tamsulosin (FLOMAX) 0.4 MG CAPS capsule, TAKE 1 CAPSULE EVERY DAY AFTER SUPPER, Disp: 90 capsule, Rfl: 1   TRUEplus Lancets 33G MISC, Use to check blood sugar once a day.  DX  E11.9, Disp: 100 each, Rfl: 1  Clinical Interview:   The following information was obtained during a clinical interview with [name] and his son prior to cognitive testing.  Cognitive Symptoms: Decreased short-term memory: Denied. His son noted more significant memory concerns. Examples included trouble recalling names and details of conversations (sometimes minutes later), as well as some repetition in day-to-day life. He also alluded to some increased misplacing of objects. Per his son, difficulties have been present for the past 1-2 years. Progression has seemed accelerated during the past 6-8 months.  Decreased long-term memory: Denied. Decreased attention/concentration: Denied. Reduced processing speed: Denied. Difficulties with executive functions: Denied. His son denied trouble with impulsivity or any significant personality changes.  Difficulties with emotion regulation: Denied. Difficulties with receptive language: Denied. Difficulties with word finding: Denied. Decreased visuoperceptual ability: Denied.  Difficulties completing ADLs: Somewhat. [name] currently lives with one of his sons. This son has taken over medication management and administers medications to [name]. This was started following [name] having trouble with appropriate medication adherence. While [name] is involved in financial management, there has been increased concern and confusion. His son described an example of [name] attempting to drive to a physical location (which would have been many states away) to pay an electronic bill. Per his son, [name] continues to drive locally without issue.   Additional Medical History: History of traumatic brain injury/concussion: Denied. Per his son, [name] did experience a few falls during  the prior year, including an outside fall while gardening last February. No head injuries were described.  History of stroke: Denied. History of seizure activity: Denied. History of known exposure to toxins: Denied. Symptoms of chronic pain: Denied. Experience of frequent headaches/migraines: Denied. Frequent instances of dizziness/vertigo: Denied.  Sensory changes: Denied. Mild hearing loss was observed by myself during interview.  Balance/coordination difficulties: Denied. Other motor difficulties: Denied.  Sleep History: Estimated hours obtained each night: 8 hours.  Difficulties falling asleep: Denied. Difficulties staying asleep: Denied. Feels rested and refreshed upon awakening: Endorsed.  History of snoring: Denied. History of waking up gasping for air: Denied. Witnessed breath cessation while asleep: Denied.  History of vivid dreaming: Denied. Excessive movement while asleep: Denied. Instances of acting out his dreams: Denied.  Psychiatric/Behavioral Health History: Depression: He described his current mood as "happy every day" and denied to his knowledge any prior mental health concerns or formal diagnoses. Current or remote suicidal ideation, intent, or plan was denied.  Anxiety: Denied. Mania: Denied. Trauma History: Denied. Visual/auditory hallucinations: Denied. Delusional thoughts: Denied.  Tobacco: Denied. Alcohol: He denied current alcohol consumption as well as a history of prior alcohol abuse or dependence.  Recreational drugs: Denied.  Family History: Problem Relation Age of Onset   Stomach cancer Mother 33   Coronary artery disease Father 40       deceased   Healthy Son    Healthy Son    Healthy Son  This information was confirmed by Mr. Fossett.  Academic/Vocational History: Highest level of educational attainment: 14 years. He graduated from high school and completed two additional years of college. 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 Relative weaknesses across subjects were denied.  History of developmental delay: Denied. History of grade repetition: Denied. Enrollment in special education courses: Denied. History of LD/ADHD: Denied.  Employment: Retired. He retired in his early 65s. 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   Evaluation Results:   Behavioral Observations: Mr. Thiesen was accompanied by his son, arrived to his appointment on time, and was appropriately dressed and groomed. He appeared alert. Observed gait and station were within normal limits. Gross motor functioning appeared intact upon informal observation and no abnormal movements (e.g., tremors) were noted. His affect was generally relaxed and positive. Spontaneous speech was fluent and word finding difficulties were not observed during the clinical interview. Thought processes were coherent, organized, and normal in content. Insight into his cognitive difficulties appeared very poor as he denied all cognitive concerns despite objective testing revealing severe memory impairment and other areas of dysfunction.   During testing, sustained attention was appropriate. Task engagement was adequate and he persisted when challenged. Overall, Mr. Berenson was cooperative with the clinical interview and subsequent testing procedures.   Adequacy of Effort: The validity of neuropsychological testing is limited by the extent to which the individual being tested may be assumed to have exerted adequate effort during testing. Mr. Toews expressed his intention to perform to the best of his abilities and exhibited adequate task engagement and persistence. Scores across stand-alone and embedded performance validity measures were variable but generally within expectation. His sole below expectation performance is believed to be due to true, severe memory impairment rather than poor  engagement or attempts to perform poorly. As such, the results of the current evaluation are believed to be a valid representation of Mr. Brenn current cognitive functioning.  Test Results: Mr. Kozik was poorly oriented at the time of the current evaluation. He incorrectly stated his age ("88"). He was also unable to state the current month ("July"), date, or time. When asked the name of the current clinic, he responded "eye doctor." When asked why he was here, he responded "eye check-up appointment."  Intellectual abilities based upon educational and vocational attainment were estimated to be in the average range. Premorbid abilities were estimated to be within the average range based upon a single-word reading test.   Processing speed was variable, ranging from the exceptionally low to average normative ranges. Basic attention was below average to average. More complex attention (e.g., working memory) was below average. Executive functioning was variable, ranging from the exceptionally low to average normative ranges. He performed in the average range across a task assessing safety and judgment.  Assessed receptive language abilities were average. Likewise, Mr. Hanby did not exhibit any difficulties comprehending task instructions and answered all questions asked of him appropriately. Assessed expressive language was variable. Phonemic fluency was below average, semantic fluency was exceptionally low to well below average, and confrontation naming was average to above average.    Assessed visuospatial/visuoconstructional abilities were mildly variable but overall appropriate, ranging from the below average to above average normative ranges.    Learning (i.e., encoding) of novel verbal information was exceptionally low. Spontaneous delayed recall (i.e., retrieval) of previously learned information was also exceptionally low. Retention rates were 0% across  a story learning task, 0% across a list learning  task, and 13% across a figure drawing task. Performance across recognition tasks was exceptionally low to well below average, suggesting negligible evidence for information consolidation.   Results of emotional screening instruments suggested that recent symptoms of generalized anxiety were in the minimal range, while symptoms of depression were within normal limits. A screening instrument assessing recent sleep quality suggested the presence of minimal sleep dysfunction.  Tables of Scores:   Note: This summary of test scores accompanies the interpretive report and should not be considered in isolation without reference to the appropriate sections in the text. Descriptors are based on appropriate normative data and may be adjusted based on clinical judgment. Terms such as "Within Normal Limits" and "Outside Normal Limits" are used when a more specific description of the test score cannot be determined.       Percentile - Normative Descriptor > 98 - Exceptionally High 91-97 - Well Above Average 75-90 - Above Average 25-74 - Average 9-24 - Below Average 2-8 - Well Below Average < 2 - Exceptionally Low       Validity:   DESCRIPTOR       DCT: --- --- Within Normal Limits  RBANS EI: --- --- Outside Normal Limits  WAIS-IV RDS: --- --- Within Normal Limits       Orientation:      Raw Score Percentile   NAB Orientation, Form 1 19/29 --- ---       Cognitive Screening:      Raw Score Percentile   SLUMS: 11/30 --- ---       RBANS, Form A: Standard Score/ Scaled Score Percentile   Total Score 55 <1 Exceptionally Low  Immediate Memory 44 <1 Exceptionally Low    List Learning 1 <1 Exceptionally Low    Story Memory 2 <1 Exceptionally Low  Visuospatial/Constructional 84 14 Below Average    Figure Copy 8 25 Average    Line Orientation 13/20 10-16 Below Average  Language 80 9 Below Average    Picture Naming 10/10 >75 Above Average    Semantic Fluency 2 <1 Exceptionally Low  Attention 68 2  Well Below Average    Digit Span 6 9 Below Average    Coding 4 2 Well Below Average  Delayed Memory 48 <1 Exceptionally Low    List Recall 0/10 <2 Exceptionally Low    List Recognition 13/20 <2 Exceptionally Low    Story Recall 1 <1 Exceptionally Low    Story Recognition 6/12 5-6 Well Below Average    Figure Recall 3 1 Exceptionally Low    Figure Recognition 1/8 <1 Exceptionally Low        Intellectual Functioning:      Standard Score Percentile   Test of Premorbid Functioning: 93 32 Average       Attention/Executive Function:     Trail Making Test (TMT): Raw Score (T Score) Percentile     Part A 42 secs.,  0 errors (47) 38 Average    Part B 110 secs.,  2 errors (49) 46 Average         Scaled Score Percentile   WAIS-IV Digit Span: 6 9 Below Average    Forward 8 25 Average    Backward 7 16 Below Average    Sequencing 6 9 Below Average        Scaled Score Percentile   WAIS-IV Similarities: 6 9 Below Average       D-KEFS Color-Word Interference Test: Raw  Score (Scaled Score) Percentile     Color Naming 54 secs. (3) 1 Exceptionally Low    Word Reading 30 secs. (8) 25 Average    Inhibition 102 secs. (9) 37 Average      Total Errors 13 errors (3) 1 Exceptionally Low    Inhibition/Switching 105 secs. (9) 37 Average      Total Errors 23 errors (1) <1 Exceptionally Low       NAB Executive Functions Module, Form 1: T Score Percentile     Judgment 53 62 Average       Language:     Verbal Fluency Test: Raw Score (Scaled Score) Percentile     Phonemic Fluency (CFL) 16 (6) 9 Below Average    Category Fluency 15 (4) 2 Well Below Average  *Based on [employer] Older Normative Studies (MOANS)          NAB Language Module, Form 1: T Score Percentile     Auditory Comprehension 46 34 Average    Naming 28/31 (45) 31 Average       Visuospatial/Visuoconstruction:      Raw Score Percentile   Clock Drawing: 10/10 --- Within Normal Limits        Scaled Score Percentile   WAIS-IV Block  Design: 13 84 Above Average       Mood and Personality:      Raw Score Percentile   PROMIS Depression Questionnaire: 8 --- None to Slight  PROMIS Anxiety Questionnaire: 7 --- None to Slight       Additional Questionnaires:      Raw Score Percentile   PROMIS Sleep Disturbance Questionnaire: 8 --- None to Slight   Informed Consent and Coding/Compliance:   The current evaluation represents a clinical evaluation for the purposes previously outlined by the referral source and is in no way reflective of a forensic evaluation.   Mr. Wooters was provided with a verbal description of the nature and purpose of the present neuropsychological evaluation. Also reviewed were the foreseeable risks and/or discomforts and benefits of the procedure, limits of confidentiality, and mandatory reporting requirements of this provider. The patient was given the opportunity to ask questions and receive answers about the evaluation. Oral consent to participate was provided by the patient.   This evaluation was conducted by Newman Nickels, Ph.D., ABPP-CN, board certified clinical neuropsychologist. Mr. Starliper completed a clinical interview with Dr. Milbert Coulter, billed as one unit [id number], and 120 minutes of cognitive testing and scoring, billed as one unit [phone] and three additional units [id number]. Psychometrist Wallace Keller, B.S. assisted Dr. Milbert Coulter with test administration and scoring procedures. As a separate and discrete service, one unit [id number] and two units [phone] were billed for Dr. Tammy Sours time spent in interpretation and report writing.

## 2023-02-22 NOTE — Progress Notes (Signed)
   Psychometrician Note   Cognitive testing was administered to [name] by Wallace Keller, B.S. (psychometrist) under the supervision of Dr. Newman Nickels, Ph.D., licensed psychologist on 02/22/2023. [name] did not appear overtly distressed by the testing session per behavioral observation or responses across self-report questionnaires. Rest breaks were offered.    The battery of tests administered was selected by Dr. Newman Nickels, Ph.D. with consideration to [name] current level of functioning, the nature of his symptoms, emotional and behavioral responses during interview, level of literacy, observed level of motivation/effort, and the nature of the referral question. This battery was communicated to the psychometrist. Communication between Dr. Newman Nickels, Ph.D. and the psychometrist was ongoing throughout the evaluation and Dr. Newman Nickels, Ph.D. was immediately accessible at all times. Dr. Newman Nickels, Ph.D. provided supervision to the psychometrist on the date of this service to the extent necessary to assure the quality of all services provided.    [name] will return within approximately 1-2 weeks for an interactive feedback session with Dr. Milbert Coulter at which time his test performances, clinical impressions, and treatment recommendations will be reviewed in detail. [name] understands he can contact our office should he require our assistance before this time.  A total of 120 minutes of billable time were spent face-to-face with [name] by the psychometrist. This includes both test administration and scoring time. Billing for these services is reflected in the clinical report generated by Dr. Newman Nickels, Ph.D.  This note reflects time spent with the psychometrician and does not include test scores or any clinical interpretations made by Dr. Milbert Coulter. The full report will follow in a separate note.

## 2023-02-23 ENCOUNTER — Encounter: Payer: Self-pay | Admitting: Psychology

## 2023-03-01 ENCOUNTER — Ambulatory Visit: Payer: Medicare HMO | Admitting: Psychology

## 2023-03-01 DIAGNOSIS — F0280 Dementia in other diseases classified elsewhere without behavioral disturbance: Secondary | ICD-10-CM | POA: Diagnosis not present

## 2023-03-01 DIAGNOSIS — G309 Alzheimer's disease, unspecified: Secondary | ICD-10-CM

## 2023-03-01 NOTE — Progress Notes (Signed)
   Neuropsychology Feedback Session [name]. [facility] [place] Department of Neurology  Reason for Referral:   [name] is a 89 y.o. right-handed Caucasian male referred by Marlowe Kays, PA-C, to characterize his current cognitive functioning and assist with diagnostic clarity and treatment planning in the context of subjective cognitive decline.   Feedback:   [name] completed a comprehensive neuropsychological evaluation on 02/22/2023. Please refer to that encounter for the full report and recommendations. Briefly, results suggested severe impairment surrounding all aspects of learning and memory. An additional impairment was exhibited across semantic fluency, while performance variability was exhibited across processing speed and executive functioning. Regarding the cause of his mild dementia presentation, I do have concerns surrounding a neurodegenerative illness, namely Alzheimer's disease. Across memory testing, [name] did not benefit from repeated information across learning trials and was fully amnestic (i.e., 0% retention) across both verbal memory tasks after a brief delay. He only exhibited 13% retention across the remaining visual memory task. Performances across yes/no recognition trials were consistently poor. Taken together, this suggests evidence for rapid forgetting and a pronounced storage impairment, both of which are the hallmark testing patterns associated with this illness. Further impairment surrounding semantic fluency and executive variability would represent typical disease progression. Intact confrontation naming is encouraging. However, concerns surrounding a dementia due to Alzheimer's disease presentation remain.  [name] was accompanied by his son during the current feedback session. Content of the current session focused on the results of his neuropsychological evaluation. [name] was given the opportunity to ask questions and his questions were  answered. He was encouraged to reach out should additional questions arise. A copy of his report was provided at the conclusion of the visit.      One unit [phone] was billed for Dr. Tammy Sours time spent preparing for, conducting, and documenting the current feedback session with [name].

## 2023-03-02 ENCOUNTER — Other Ambulatory Visit: Payer: Self-pay | Admitting: Family Medicine

## 2023-03-02 DIAGNOSIS — I10 Essential (primary) hypertension: Secondary | ICD-10-CM

## 2023-03-04 ENCOUNTER — Other Ambulatory Visit: Payer: Self-pay | Admitting: Family Medicine

## 2023-03-04 DIAGNOSIS — I10 Essential (primary) hypertension: Secondary | ICD-10-CM

## 2023-03-04 MED ORDER — AMLODIPINE BESYLATE 5 MG PO TABS: 7.5000 mg | ORAL_TABLET | Freq: Every day | ORAL | 1 refills | Status: DC

## 2023-03-17 ENCOUNTER — Ambulatory Visit (HOSPITAL_BASED_OUTPATIENT_CLINIC_OR_DEPARTMENT_OTHER)
Admission: RE | Admit: 2023-03-17 | Discharge: 2023-03-17 | Disposition: A | Discharge status: Home / Self Care | Payer: Medicare HMO | Source: Ambulatory Visit | Attending: Family Medicine | Admitting: Family Medicine

## 2023-03-17 DIAGNOSIS — M810 Age-related osteoporosis without current pathological fracture: Secondary | ICD-10-CM | POA: Insufficient documentation

## 2023-03-23 ENCOUNTER — Other Ambulatory Visit: Payer: Self-pay | Admitting: Family Medicine

## 2023-03-23 DIAGNOSIS — I10 Essential (primary) hypertension: Secondary | ICD-10-CM

## 2023-03-23 DIAGNOSIS — E1169 Type 2 diabetes mellitus with other specified complication: Secondary | ICD-10-CM

## 2023-05-01 ENCOUNTER — Other Ambulatory Visit: Payer: Self-pay | Admitting: Hematology and Oncology

## 2023-05-01 DIAGNOSIS — D472 Monoclonal gammopathy: Secondary | ICD-10-CM

## 2023-05-01 NOTE — Progress Notes (Unsigned)
[facility] [phone]   [phone]  PROGRESS NOTE  Patient Care Team: Zola Button, Grayling Congress, DO as PCP - General (Family Medicine) Ihor Gully, MD (Inactive) as Consulting Physician (Urology) Margaretmary Dys, MD as Consulting Physician (Radiation Oncology) Holli Humbles, MD as Referring Physician (Ophthalmology) Jaci Standard, MD as Consulting Physician (Hematology and Oncology) [facility], Konrad Dolores, MD as Consulting Physician (Endocrinology) Noel Christmas, MD as Consulting Physician (Urology) Bufford Buttner, MD as Consulting Physician (Nephrology) Henrene Pastor, RPH-CPP (Pharmacist) Elwyn Reach (Neurology)  Hematological/Oncological History #Smoldering Multiple Myeloma, Intermediate Risk 1) 02/15/19: found to have ambda chains 645.5, kappa 27.9 with a ratio of 0.04, no serum M protein during nephrology work up.  2) 04/06/19: establish care with Dr. Leonides Schanz 3) 04/06/19: SPEP shows no serum monoclonal protein, however high levels of M protein detected in the urine. B2Microglobulin at 3.3. Bone Survey negative.  4)04/23/19: Bone Marrow biopsy performed, confirmed 10-15% clonal plasma cells (9% in aspirate). Confirmed diagnosis of Smoldering multiple myeloma, intermediate risk. 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 Gleason Score of 3+4 1) 05/06/17: Insertion of radioactive I-125 seeds into the prostate gland with placement of SpaceOAR; 145 Gy, definitive therapy. Radiation oncologist was Dr. Kathrynn Running at [facility] 2) Subsequently followed by outside urology with serial PSA measurements. Reported last measurement in Oct 2020 at PSA 0.2.   Interval History:  [name] 89 y.o. male with medical history significant for smoldering multiple myeloma and prostate cancer presents for a follow up visit. He was last seen on 11/03/2022 for a routine f/u. In the interim since his last visit [name] has had no  changes in medications, no hospitalizations or emergency room visits.  On exam today [name] is accompanied by his son.  He reports that he has been doing well overall in the interim since our last visit.  He reports he will be having Thanksgiving in [place] with his other son Jasire.  He reports that he is feeling quite good and running at the gym on a regular basis.  He recently placed first in 100 to 200 m-.  He notes that he has been maintaining his weight at home 25 pounds and is very steady.  He has been eating well with a lot of chicken and seafood.  He reports he drinks coffee twice a day from [place].  He is not having any bone or back pain.  He is not having any pain anywhere at the moment.  He denies any runny nose, sore throat, or cough.  He has received his flu vaccine and is up-to-date on his other vaccines through his PCP.  He denies easy bruising or signs of bleeding. He denies fevers, chills, night sweats, shortness of breath, chest pain or cough. He has no other complaints. Rest of the 10 point ROS is below.   MEDICAL HISTORY:  Past Medical History:  Diagnosis Date   Anemia, unspecified 10/27/2009   BPH with urinary obstruction 12/13/2012   CAD (coronary artery disease)    LHC 03/25/11 by Dr. Excell Seltzer:  pLAD 99%, oCFX 20-30%, pOM1 40%, dAVCFX 20-30%.  EF was normal on nuclear study.  He was treated with a Promus DES to his pLAD.    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 02/22/2023   Monoclonal gammopathy 04/06/2019   Multiple closed fractures of ribs of left side 03/21/2019   Multiple myeloma not having achieved remission 08/13/2022   Osteoporosis 03/21/2019    Peripheral vascular disorder due to diabetes mellitus 08/13/2022   Personal history of colonic polyps 1996   villous adenoma   Primary open angle glaucoma (POAG) of left eye, mild stage 02/04/2019   Primary open angle glaucoma (POAG) of right eye, moderate stage 02/04/2019   Prostate cancer 02/15/2017   Stage 3b chronic kidney disease 02/06/2020   Type II diabetes mellitus 07/04/2006   Vitamin D deficiency 01/28/2021    SURGICAL HISTORY: Past Surgical History:  Procedure Laterality Date   CARDIAC CATHETERIZATION  03/25/2011   cataract Bilateral 2010   corenea implants  june and sept 2018   dr Molli [facility]   CORONARY STENT PLACEMENT  03/25/2011   RADIOACTIVE SEED IMPLANT N/A 05/06/2017   Procedure: RADIOACTIVE SEED IMPLANT/BRACHYTHERAPY IMPLANT;  Surgeon: Ihor Gully, MD;  Location: [facility];  Service: Urology;  Laterality: N/A;   SPACE OAR INSTILLATION N/A 05/06/2017   Procedure: SPACE OAR INSTILLATION;  Surgeon: Ihor Gully, MD;  Location: [facility];  Service: Urology;  Laterality: N/A;    SOCIAL HISTORY: Social History   Socioeconomic History   Marital status: Widowed    Spouse name: Not on file   Number of children: Not on file   Years of education: 14   Highest education level: Some college, no degree  Occupational History   Occupation: Retired  Tobacco Use   Smoking status: Former    Current packs/day: 0.00    Average packs/day: 0.3 packs/day for 3.0 years (0.8 ttl pk-yrs)    Types: Cigarettes    Start date: 06/08/1955    Quit date: 06/07/1958    Years since quitting: 64.9   Smokeless tobacco: Never  Vaping Use   Vaping status: Never Used  Substance and Sexual Activity   Alcohol use: Not Currently   Drug use: No   Sexual activity: Not Currently  Other Topics Concern   Not on file  Social History Narrative   Right   Lives with son    Retired   Two story home   Caffeine  2 cup a day   Still drives   Social  Determinants of Health   Financial Resource Strain: Low Risk  (06/23/2021)   Overall Financial Resource Strain (CARDIA)    Difficulty of Paying Living Expenses: Not hard at all  Food Insecurity: No Food Insecurity (09/17/2022)   Hunger Vital Sign    Worried About Running Out of Food in the Last Year: Never true    Ran Out of Food in the Last Year: Never true  Transportation Needs: No Transportation Needs (09/17/2022)   PRAPARE - Administrator, Civil Service (Medical): No    Lack of Transportation (Non-Medical): No  Physical Activity: Sufficiently Active (06/23/2021)   Exercise Vital Sign    Days of Exercise per Week: 7 days    Minutes of Exercise per Session: 30 min  Stress: No Stress Concern Present (09/15/2021)   [employer] of Occupational Health - Occupational Stress Questionnaire    Feeling of Stress : Not at all  Social Connections: Moderately Integrated (12/18/2020)   Social Connection and Isolation Panel [NHANES]    Frequency of Communication with Friends and Family: More than three times  a week    Frequency of Social Gatherings with Friends and Family: Twice a week    Attends Religious Services: More than 4 times per year    Active Member of [employer] or Organizations: Yes    Attends Banker Meetings: More than 4 times per year    Marital Status: Widowed  Intimate Partner Violence: Not At Risk (09/17/2022)   Humiliation, Afraid, Rape, and Kick questionnaire    Fear of Current or Ex-Partner: No    Emotionally Abused: No    Physically Abused: No    Sexually Abused: No    FAMILY HISTORY: Family History  Problem Relation Age of Onset   Stomach cancer Mother 47   Coronary artery disease Father 67       deceased   Healthy Son    Healthy Son    Healthy Son     ALLERGIES:  has No Known Allergies.  MEDICATIONS:  Current Outpatient Medications  Medication Sig Dispense Refill   acetaminophen (TYLENOL) 500 MG tablet Take 2 tablets (1,000 mg total)  by mouth every 6 (six) hours as needed. 60 tablet 0   Alcohol Swabs PADS Use as directed once a day 100 each 1   alendronate (FOSAMAX) 70 MG tablet TAKE 1 TABLET ONCE WEEKLY *TAKE WITH A FULL GLASS OF WATER ON AN EMPTY STOMACH* 12 tablet 0   amLODipine (NORVASC) 5 MG tablet Take 1.5 tablets (7.5 mg total) by mouth daily. 135 tablet 1   aspirin 81 MG tablet Take 81 mg by mouth daily.     atorvastatin (LIPITOR) 20 MG tablet Take 1 tablet (20 mg total) by mouth daily. 90 tablet 1   Blood Glucose Calibration (TRUE METRIX LEVEL 1) Low SOLN USE AS DIRECTED 1 each 3   Blood Glucose Calibration (TRUE METRIX LEVEL 3) High SOLN Use to check controls on glucometer strips every 30 days or with each new bottle of strips (whichever comes first). 3 each 1   Blood Glucose Monitoring Suppl (TRUE METRIX METER) w/Device KIT USE AS DIRECTED ONCE A DAY 1 kit 0   brimonidine-timolol (COMBIGAN) 0.2-0.5 % ophthalmic solution Place 1 drop into both eyes 2 (two) times daily.     COLLAGEN PO Take by mouth. 1 scoop daily     empagliflozin (JARDIANCE) 25 MG TABS tablet Take 1 tablet (25 mg total) by mouth daily before breakfast. 90 tablet 3   fish oil-omega-3 fatty acids 1000 MG capsule Take 2 g by mouth daily.     glucose blood (TRUE METRIX BLOOD GLUCOSE TEST) test strip USE TO CHECK BLOOD GLUCOSE ONCE A DAY 100 strip 12   insulin glargine (LANTUS SOLOSTAR) 100 UNIT/ML Solostar Pen Inject 10 Units into the skin daily. 15 mL 11   Insulin Pen Needle 32G X 4 MM MISC 1 Device by Does not apply route daily in the afternoon. 100 each 3   losartan-hydrochlorothiazide (HYZAAR) 100-25 MG tablet Take 1 tablet by mouth daily. 90 tablet 1   memantine (NAMENDA) 5 MG tablet Take 1 tablet (5 mg total) by mouth at bedtime. 30 tablet 11   metoprolol succinate (TOPROL-XL) 50 MG 24 hr tablet TAKE 1 TABLET EVERY DAY WITH OR IMMEDIATELY FOLLOWING A MEAL 90 tablet 3   Multiple Vitamin (MULTIVITAMIN) tablet Take 1 tablet by mouth daily.      tamsulosin (FLOMAX) 0.4 MG CAPS capsule TAKE 1 CAPSULE EVERY DAY AFTER SUPPER 90 capsule 3   TRUEplus Lancets 33G MISC Use to check blood sugar once a  day.  DX  E11.9 100 each 1   No current facility-administered medications for this visit.    REVIEW OF SYSTEMS:   Constitutional: ( - ) fevers, ( - )  chills , ( - ) night sweats Eyes: ( - ) blurriness of vision, ( - ) double vision, ( - ) watery eyes Ears, nose, mouth, throat, and face: ( - ) mucositis, ( - ) sore throat Respiratory: ( - ) cough, ( - ) dyspnea, ( - ) wheezes Cardiovascular: ( - ) palpitation, ( - ) chest discomfort, ( - ) lower extremity swelling Gastrointestinal:  ( - ) nausea, ( - ) heartburn, ( - ) change in bowel habits Skin: ( - ) abnormal skin rashes Lymphatics: ( - ) new lymphadenopathy, ( - ) easy bruising Neurological: ( - ) numbness, ( - ) tingling, ( - ) new weaknesses Behavioral/Psych: ( - ) mood change, ( - ) new changes  All other systems were reviewed with the patient and are negative.  PHYSICAL EXAMINATION: ECOG PERFORMANCE STATUS: 0 - Asymptomatic  Vitals:   05/02/23 0840  BP: 128/84  Pulse: (!) 55  Resp: 16  Temp: 97.6 F (36.4 C)  SpO2: 98%    Filed Weights   05/02/23 0840  Weight: 125 lb (56.7 kg)     GENERAL: well appearing elderly Caucasian male, alert, no distress and comfortable SKIN: skin color, texture, turgor are normal, no rashes or significant lesions EYES: conjunctiva are pink and non-injected, sclera clear LUNGS: clear to auscultation and percussion with normal breathing effort HEART: regular rate & rhythm and no murmurs and no lower extremity edema Musculoskeletal: no cyanosis of digits and no clubbing  PSYCH: alert & oriented x 3, fluent speech NEURO: no focal motor/sensory deficits  LABORATORY DATA:  I have reviewed the data as listed     Latest Ref Rng & Units 02/14/2023   10:02 AM 11/03/2022    9:39 AM 08/13/2022    9:32 AM  CMP  Glucose 70 - 99 mg/dL 97  846  962    BUN 6 - 23 mg/dL 26  30  13    Creatinine 0.40 - 1.50 mg/dL 9.52  8.41  3.24   Sodium 135 - 145 mEq/L 144  137  140   Potassium 3.5 - 5.1 mEq/L 3.7  3.8  4.2   Chloride 96 - 112 mEq/L 103  101  101   CO2 19 - 32 mEq/L 33  30  31   Calcium 8.4 - 10.5 mg/dL 9.3  8.6  9.0   Total Protein 6.0 - 8.3 g/dL 6.9  6.4  5.7   Total Bilirubin 0.2 - 1.2 mg/dL 0.6  0.7  0.8   Alkaline Phos 39 - 117 U/L 106  126  122   AST 0 - 37 U/L 12  11  14    ALT 0 - 53 U/L 14  16  14        Latest Ref Rng & Units 05/02/2023    8:14 AM 11/03/2022    9:39 AM 08/13/2022    9:32 AM  CBC  WBC 4.0 - 10.5 K/uL 10.5  9.1  9.4   Hemoglobin 13.0 - 17.0 g/dL 40.1  02.7  25.3   Hematocrit 39.0 - 52.0 % 40.8  36.3  42.4   Platelets 150 - 400 K/uL 223  209  223.0      Surgical Pathology  CASE: [id number]  PATIENT: Ailyn Gladd Attia  Bone Marrow Report   Clinical  History: MGUS, left posterior iliac crest, (ADC)   DIAGNOSIS:   BONE MARROW, ASPIRATE, CLOT, CORE:  - Plasma cell myeloma, see comment.  - No amyloid deposits.  - Minimal iron stores.   PERIPHERAL BLOOD:  - Normocytic anemia.   COMMENT:   The marrow is mildly hypercellular with increased monotypic plasma cells  (9% aspirate, 10-15% CD138 immunohistochemistry). There are no amyloid  deposits seen with Congo red, although there is a lack of larger vessels  in the core biopsy. The findings are consistent with plasma cell  myeloma.   MICROSCOPIC DESCRIPTION:   PERIPHERAL BLOOD SMEAR: There is a normocytic anemia with scattered  elliptocytes/ovalocytes.  There is no rouleaux formation.  Leukocytes  are present in normal numbers.  Circulating plasma cells are not  identified.  Platelets are present in normal numbers.   BONE MARROW ASPIRATE: Spicular cellular.  Erythroid precursors: Present in appropriate proportions.  No  significant dysplasia.  Granulocytic precursors: Present in appropriate proportions.  No  significant dysplasia.  No increase in  blasts.  Megakaryocytes: Present and largely unremarkable.  Lymphocytes/plasma cells: There is a mild increase in plasma cells (9%  by manual differential counts) with scattered atypical forms (large  forms, binucleation).  Lymphocytes are not increased.   TOUCH PREPARATIONS: Similar to aspirate smears.   CLOT AND BIOPSY: The core biopsies and clot sections are mildly  hypercellular for age (20 to 30%).  CD138 immunohistochemistry reveals  increased plasma cells (10 to 15%) scattered in small clusters.  By  light chain in situ hybridization plasma cells are lambda restricted.  Myeloid and erythroid elements are present in appropriate proportions.  Megakaryocytes exhibit a spectrum of maturation without tight clusters.  There are few benign appearing lymphoid aggregates. Congo red is  negative for amyloid deposits.   IRON STAIN: Iron stains are performed on a bone marrow aspirate or touch  imprint smear and section of clot. The controls stained appropriately.        Storage Iron: Minimal.       Ring Sideroblasts: Absent.   ADDITIONAL DATA/TESTING: Cytogenetics, including FISH for myeloma, was  ordered and will be reported separately.   CELL COUNT DATA:   Bone Marrow count performed on 500 cells shows:  Blasts:   0%   Myeloid:  47%  Promyelocytes: 0%   Erythroid:     23%  Myelocytes:    5%   Lymphocytes:   17%  Metamyelocytes:     0%   Plasma cells:  9%  Bands:    16%  Neutrophils:   22%  M:E ratio:     2.04  Eosinophils:   4%  Basophils:     0%  Monocytes:     4%   Lab Data: CBC performed on 04/23/2019 shows:  WBC: 8.6 k/uL  Neutrophils:   71%  Hgb: 12.4 g/dL Lymphocytes:   16%  HCT: 38.0 %    Monocytes:     3%  MCV: 92.7 fL   Eosinophils:   0%  RDW: 13.8 %    Basophils:     1%  PLT: 221 k/uL   GROSS DESCRIPTION:   A: Aspirate smear   B: The specimen is received in B-plus fixative and consists of a 21.0 x  12.0 x 5.0 mm aggregate of red-brown clotted blood.  The  specimen is  entirely submitted in cassette B.   C: The specimen is received in B-plus fixative and consists of 2 cores  of tan bone, measuring  0.6 and 0.9 cm in length by 0.2 cm in diameter.  The specimen is entirely submitted in cassette C. Lovey Newcomer 04/25/2019)   Final Diagnosis performed by Valinda Hoar, MD.   Electronically signed  04/25/2019  Technical and / or Professional components performed at [facility], [address], [place], [place] [address].   Immunohistochemistry Technical component (if applicable) was performed  at [facility]. [address], [address],  [place], [place] [address].   IMMUNOHISTOCHEMISTRY DISCLAIMER (if applicable):  Some of these immunohistochemical stains may have been developed and the  performance characteristics determine by [facility]. Some  may not have been cleared or approved by the U.S. Food and Drug  Administration. The FDA has determined that such clearance or approval  is not necessary. This test is used for clinical purposes. It should not  be regarded as investigational or for research. This laboratory is  certified under the Clinical Laboratory Improvement Amendments of 1988  (CLIA-88) as qualified to perform high complexity clinical laboratory  testing.  The controls stained appropriately.   RADIOGRAPHIC STUDIES: No relevant radiographic studies.   ASSESSMENT & PLAN [name] is a 89 y.o. male with medical history significant for CAD s/p PCI in 2012, DM type II, Prostate cancer s/p brachytherapy, and HTN who presents for follow up for his smoldering multiple myeloma.   #Smoldering Multiple Myeloma, Intermediate Risk --confirmed diagnosis based on the bone marrow biopsy, with plasma cells of 10-15% and no CRAB criteria. No evidence of amyloidosis on bone marrow stain.  --treatment of Smoldering multiple myeloma is a newer idea and predominately consists of monotherapy lenalidomide.  Using the [facility] 2018 20/2/20 guidelines, [name] is a borderline Intermediate Risk SMM.  --given his advance aged and lack of any CRAB criteria, I would recommend holding on treatment at this time with close clinical monitoring for progression. [name] was in agreement with close continued monitoring.  --will collect SPEP, UPEP, and SFLC on a routine basis, q 6 months.  -- will order metastatic survey yearly PLAN: --labs today show WBC 10.5, Hgb 13.8, MCV 88.1, Plt 223 --most recent bone met survey from 05/04/2022 didn't show any evidence of lytic lesions or pathologic fractures. Next one due around November 2024.  --RTC in 6 months time unless remaining labs require any intervention.   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 Gleason Score of 3+4 --[name] is s/p definitive radioactive seed placement in Nov 2018 --continue to f/u with outside urology group for routine PSA monitoring, though if we are following Mr. Mcgillen for a monoclonal gammopathy this is something we can monitor as well.  - PSA <0.1 at last visit   All questions were answered. The patient knows to call the clinic with any problems, questions or concerns.  I have spent a total of 25 minutes minutes of face-to-face and non-face-to-face time, preparing to see the patient,  performing a medically appropriate examination, counseling and educating the patient, ordering tests/procedures, communicating with other health care professionals, documenting clinical information in the electronic health record,  and care coordination.   Ulysees Barns, MD Department of Hematology/Oncology [facility] Phone: [phone] Pager: [phone] Email: [email or web address]    05/02/2023 8:55 AM  Literature Support:  Leonie Man, Gracy Bruins AM, Buadi FK, Christen Bame, Matous JV, Anderson DM, Emmons RV, Mahindra A, Wagner LI, Dhodapkar  MV, Rajkumar SV. Randomized  Trial of Lenalidomide Versus Observation in Smoldering Multiple Myeloma. J Clin Oncol. 2020 Apr 10;38(11):1126-1137. doi: 10.1200/JCO.19.01740. Epub 2019 Oct 25. PMID: 82956213; PMCID: [id number]. --Progression-free survival was significantly longer with lenalidomide compared with observation (hazard ratio, 0.28; 95% CI, 0.12 to 0.62; P = .002). One-, 2-, and 3-year progression-free survival was 98%, 93%, and 91% for the lenalidomide arm versus 89%, 76%, and 66% for the observation arm, respectively.  Lakshman, A., Rajkumar, S.V., Buadi, F.K. et al. Risk stratification of smoldering multiple myeloma incorporating revised IMWG diagnostic criteria. Blood Cancer Journal 8, 59 (2018). [email or web address] --The median TTP for low-, intermediate-, and high-risk groups were 110, 68, and 29 months, respectively (p?<?0.0001). BMPC%?>?20%, M-protein?>?2?g/dL, and EXBM?>?84 at diagnosis can be used to risk stratify patients with SMM. Patients with high-risk SMM need close follow-up and are candidates for clinical trials aiming to prevent progression.

## 2023-05-02 ENCOUNTER — Inpatient Hospital Stay: Payer: Medicare HMO | Attending: Hematology and Oncology

## 2023-05-02 ENCOUNTER — Other Ambulatory Visit: Payer: Self-pay | Admitting: Internal Medicine

## 2023-05-02 ENCOUNTER — Inpatient Hospital Stay: Payer: Medicare HMO | Admitting: Hematology and Oncology

## 2023-05-02 VITALS — BP 128/84 | HR 55 | Temp 97.6°F | Resp 16 | Wt 125.0 lb

## 2023-05-02 DIAGNOSIS — D472 Monoclonal gammopathy: Secondary | ICD-10-CM

## 2023-05-02 DIAGNOSIS — C61 Malignant neoplasm of prostate: Secondary | ICD-10-CM | POA: Diagnosis not present

## 2023-05-02 DIAGNOSIS — M80032D Age-related osteoporosis with current pathological fracture, left forearm, subsequent encounter for fracture with routine healing: Secondary | ICD-10-CM

## 2023-05-02 LAB — CMP (CANCER CENTER ONLY)
ALT: 21 U/L (ref 0–44)
AST: 14 U/L — ABNORMAL LOW (ref 15–41)
Albumin: 4 g/dL (ref 3.5–5.0)
Alkaline Phosphatase: 115 U/L (ref 38–126)
Anion gap: 6 (ref 5–15)
BUN: 23 mg/dL (ref 8–23)
CO2: 33 mmol/L — ABNORMAL HIGH (ref 22–32)
Calcium: 9.7 mg/dL (ref 8.9–10.3)
Chloride: 102 mmol/L (ref 98–111)
Creatinine: 1.63 mg/dL — ABNORMAL HIGH (ref 0.61–1.24)
GFR, Estimated: 40 mL/min — ABNORMAL LOW (ref >60)
Glucose, Bld: 280 mg/dL — ABNORMAL HIGH (ref 70–99)
Potassium: 3.9 mmol/L (ref 3.5–5.1)
Sodium: 141 mmol/L (ref 135–145)
Total Bilirubin: 0.7 mg/dL (ref <1.2)
Total Protein: 7 g/dL (ref 6.5–8.1)

## 2023-05-02 LAB — CBC WITH DIFFERENTIAL (CANCER CENTER ONLY)
Abs Immature Granulocytes: 0.03 10*3/uL (ref 0.00–0.07)
Basophils Absolute: 0.2 10*3/uL — ABNORMAL HIGH (ref 0.0–0.1)
Basophils Relative: 1 %
Eosinophils Absolute: 0.9 10*3/uL — ABNORMAL HIGH (ref 0.0–0.5)
Eosinophils Relative: 9 %
HCT: 40.8 % (ref 39.0–52.0)
Hemoglobin: 13.8 g/dL (ref 13.0–17.0)
Immature Granulocytes: 0 %
Lymphocytes Relative: 28 %
Lymphs Abs: 3 10*3/uL (ref 0.7–4.0)
MCH: 29.8 pg (ref 26.0–34.0)
MCHC: 33.8 g/dL (ref 30.0–36.0)
MCV: 88.1 fL (ref 80.0–100.0)
Monocytes Absolute: 0.8 10*3/uL (ref 0.1–1.0)
Monocytes Relative: 8 %
Neutro Abs: 5.6 10*3/uL (ref 1.7–7.7)
Neutrophils Relative %: 54 %
Platelet Count: 223 10*3/uL (ref 150–400)
RBC: 4.63 MIL/uL (ref 4.22–5.81)
RDW: 13.4 % (ref 11.5–15.5)
WBC Count: 10.5 10*3/uL (ref 4.0–10.5)
nRBC: 0 % (ref 0.0–0.2)

## 2023-05-02 LAB — LACTATE DEHYDROGENASE: LDH: 142 U/L (ref 98–192)

## 2023-05-03 LAB — KAPPA/LAMBDA LIGHT CHAINS
Kappa free light chain: 38.1 mg/L — ABNORMAL HIGH (ref 3.3–19.4)
Kappa, lambda light chain ratio: 0.06 — ABNORMAL LOW (ref 0.26–1.65)
Lambda free light chains: 591.2 mg/L — ABNORMAL HIGH (ref 5.7–26.3)

## 2023-05-09 LAB — MULTIPLE MYELOMA PANEL, SERUM
Albumin SerPl Elph-Mcnc: 3.8 g/dL (ref 2.9–4.4)
Albumin/Glob SerPl: 1.5 (ref 0.7–1.7)
Alpha 1: 0.2 g/dL (ref 0.0–0.4)
Alpha2 Glob SerPl Elph-Mcnc: 0.8 g/dL (ref 0.4–1.0)
B-Globulin SerPl Elph-Mcnc: 0.8 g/dL (ref 0.7–1.3)
Gamma Glob SerPl Elph-Mcnc: 0.9 g/dL (ref 0.4–1.8)
Globulin, Total: 2.7 g/dL (ref 2.2–3.9)
IgA: 189 mg/dL (ref 61–437)
IgG (Immunoglobin G), Serum: 1060 mg/dL (ref 603–1613)
IgM (Immunoglobulin M), Srm: 76 mg/dL (ref 15–143)
Total Protein ELP: 6.5 g/dL (ref 6.0–8.5)

## 2023-05-12 NOTE — Progress Notes (Signed)
Name: [name]  Age/ Sex: 89 y.o., male   MRN/ DOB: [id number], 10-15-1931     PCP: Zola Button, Grayling Congress, DO   Reason for Endocrinology Evaluation: Type 2 Diabetes Mellitus  Initial Endocrine Consultative Visit: 02/06/2020    PATIENT IDENTIFIER: [name] is a 89 y.o. male with a past medical history of T2DM, CAD and HTN. The patient has followed with Endocrinology clinic since 02/06/2020 for consultative assistance with management of his diabetes.  DIABETIC HISTORY:  [name] was diagnosed with DM in 2008, has been on Glimepiride for years, janumet started in 01/2020. His hemoglobin A1c has ranged from 6.7% in 2017, peaking at 9.6%in 2021.   On initial visit his A1c was 9.6 % , he was just started on Janumet and we did not make any changes and continued Glimepiride.  Discontinued Janumet and started Jardiance only due to low GFR 08/2021    SUBJECTIVE:   During the last visit (12/27/2022): A1c 12.9%.  Today (05/13/2023): [name] is here for a follow up on diabetes management.  He is accompanied by his son [name]  Patient lives with his son [name], who has been managing his medications.  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  Denies constipation or diarrhea  Denies nausea or vomiting     HOME DIABETES REGIMEN:  Lantus 10 units daily Jardiance 25 mg daily    Statin: yes ACE-I/ARB: yes   GLUCOSE LOG:  30 day average 313mg /dL  [phone]  DIABETIC COMPLICATIONS: Microvascular complications:  CKD III Denies: retinopathy, neuropathy Last Eye Exam: Completed 12/13/2022  Macrovascular complications:  CAD ( S/P stent) Denies:  CVA, PVD   HISTORY:  Past Medical History:  Past Medical History:  Diagnosis Date   Anemia, unspecified 10/27/2009   BPH with urinary obstruction 12/13/2012    CAD (coronary artery disease)    LHC 03/25/11 by Dr. Excell Seltzer:  pLAD 99%, oCFX 20-30%, pOM1 40%, dAVCFX 20-30%.  EF was normal on nuclear study.  He was treated with a Promus DES to his pLAD.    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/2023   Monoclonal gammopathy 04/06/2019   Multiple closed fractures of ribs of left side 03/21/2019   Multiple myeloma not having achieved remission 08/13/2022   Osteoporosis 03/21/2019   Peripheral vascular disorder due to diabetes mellitus 08/13/2022   Personal history of colonic polyps 1996   villous adenoma   Primary open angle glaucoma (POAG) of left eye, mild stage 02/04/2019   Primary open angle glaucoma (POAG) of right eye, moderate stage 02/04/2019   Prostate cancer 02/15/2017   Stage 3b chronic kidney disease 02/06/2020   Type II diabetes mellitus 07/04/2006   Vitamin D deficiency 01/28/2021   Past Surgical History:  Past Surgical History:  Procedure Laterality Date   CARDIAC CATHETERIZATION  03/25/2011   cataract Bilateral 2010   corenea implants  june and sept 2018   dr Molli [facility]   CORONARY STENT PLACEMENT  03/25/2011   RADIOACTIVE SEED IMPLANT N/A  05/06/2017   Procedure: RADIOACTIVE SEED IMPLANT/BRACHYTHERAPY IMPLANT;  Surgeon: Ihor Gully, MD;  Location: [facility];  Service: Urology;  Laterality: N/A;   SPACE OAR INSTILLATION N/A 05/06/2017   Procedure: SPACE OAR INSTILLATION;  Surgeon: Ihor Gully, MD;  Location: [facility];  Service: Urology;  Laterality: N/A;   Social History:  reports that he quit smoking about 64 years ago. His smoking use included cigarettes. He  started smoking about 67 years ago. He has a 0.8 pack-year smoking history. He has never used smokeless tobacco. He reports that he does not currently use alcohol. He reports that he does not use drugs. Family History:  Family History  Problem Relation Age of Onset   Stomach cancer Mother 61   Coronary artery disease Father 56       deceased   Healthy Son    Healthy Son    Healthy Son      HOME MEDICATIONS: Allergies as of 05/13/2023   No Known Allergies      Medication List        Accurate as of May 13, 2023  9:03 AM. If you have any questions, ask your nurse or doctor.          STOP taking these medications    glipiZIDE 10 MG tablet Commonly known as: GLUCOTROL Stopped by: Johnney Ou Amerigo Mcglory   glipiZIDE 5 MG tablet Commonly known as: GLUCOTROL Stopped by: Johnney Ou Jashley Yellin       TAKE these medications    Abrysvo 120 MCG/0.5ML injection Generic drug: RSV bivalent vaccine   acetaminophen 500 MG tablet Commonly known as: TYLENOL Take 2 tablets (1,000 mg total) by mouth every 6 (six) hours as needed.   Alcohol Swabs Pads Use as directed once a day   alendronate 70 MG tablet Commonly known as: FOSAMAX TAKE 1 TABLET ONCE WEEKLY *TAKE WITH A FULL GLASS OF WATER ON AN EMPTY STOMACH*   amLODipine 5 MG tablet Commonly known as: NORVASC Take 1.5 tablets (7.5 mg total) by mouth daily.   aspirin 81 MG tablet Take 81 mg by mouth daily.   atorvastatin 20 MG tablet Commonly known as: LIPITOR Take 1 tablet (20 mg total) by mouth daily.   Boostrix 5-2.5-18.5 LF-MCG/0.5 injection Generic drug: Tdap   brimonidine-timolol 0.2-0.5 % ophthalmic solution Commonly known as: COMBIGAN Place 1 drop into both eyes 2 (two) times daily.   COLLAGEN PO Take by mouth. 1 scoop daily   empagliflozin 25 MG Tabs tablet Commonly known as: Jardiance Take 1 tablet (25 mg total) by mouth daily before breakfast.   fish oil-omega-3 fatty acids 1000 MG capsule Take 2  g by mouth daily.   Fluzone High-Dose 0.5 ML injection Generic drug: Influenza vac split quadrivalent PF   Insulin Pen Needle 32G X 4 MM Misc 1 Device by Does not apply route daily in the afternoon.   Lantus SoloStar 100 UNIT/ML Solostar Pen Generic drug: insulin glargine Inject 10 Units into the skin daily.   latanoprost 0.005 % ophthalmic solution Commonly known as: XALATAN SMARTSIG:In Eye(s)   losartan-hydrochlorothiazide 100-25 MG tablet Commonly known as: HYZAAR Take 1 tablet by mouth daily.   memantine 5 MG tablet Commonly known as: NAMENDA Take 1 tablet (5 mg total) by mouth at bedtime.   metoprolol succinate 50 MG 24 hr tablet Commonly known as: TOPROL-XL TAKE 1 TABLET EVERY DAY WITH OR IMMEDIATELY FOLLOWING A MEAL   multivitamin tablet Take 1 tablet by mouth daily.   Shingrix  injection Generic drug: Zoster Vaccine Adjuvanted   Spikevax syringe Generic drug: COVID-19 mRNA vaccine   tamsulosin 0.4 MG Caps capsule Commonly known as: FLOMAX TAKE 1 CAPSULE EVERY DAY AFTER SUPPER   True Metrix Blood Glucose Test test strip Generic drug: glucose blood USE TO CHECK BLOOD GLUCOSE ONCE A DAY   True Metrix Level 3 High Soln Use to check controls on glucometer strips every 30 days or with each new bottle of strips (whichever comes first).   True Metrix Level 1 Low Soln USE AS DIRECTED   True Metrix Meter w/Device Kit USE AS DIRECTED ONCE A DAY   TRUEplus Lancets 33G Misc Use to check blood sugar once a day.  DX  E11.9         OBJECTIVE:   Vital Signs: BP 122/80 (BP Location: Left Arm, Patient Position: Sitting, Cuff Size: Small)   Pulse 62   Ht 5\' 4"  (1.626 m)   Wt 126 lb (57.2 kg)   SpO2 95%   BMI 21.63 kg/m   Wt Readings from Last 3 Encounters:  05/13/23 126 lb (57.2 kg)  05/02/23 125 lb (56.7 kg)  02/14/23 124 lb 6.4 oz (56.4 kg)     Exam: General: Pt appears well and is in NAD  Lungs: Clear with good BS bilat   Heart: RRR    Extremities: No pretibial edema.  Neuro: MS is good with appropriate affect, pt is alert and Ox3   DM foot exam: 08/25/2022 The skin of the feet is without sores or ulcerations. The pedal pulses are 1+ on right and 1+ on left. The sensation is intact to a screening 5.07, 10 gram monofilament on the right          DATA REVIEWED:  Lab Results  Component Value Date   HGBA1C 12.9 (A) 12/27/2022   HGBA1C 13.5 (A) 09/27/2022   HGBA1C 13.7 (H) 08/13/2022     Latest Reference Range & Units 11/03/22 09:39  Sodium 135 - 145 mmol/L 137  Potassium 3.5 - 5.1 mmol/L 3.8  Chloride 98 - 111 mmol/L 101  CO2 22 - 32 mmol/L 30  Glucose 70 - 99 mg/dL 811 (H)  BUN 8 - 23 mg/dL 30 (H)  Creatinine 9.14 - 1.24 mg/dL 7.82 (H)  Calcium 8.9 - 10.3 mg/dL 8.6 (L)  Anion gap 5 - 15  6  Alkaline Phosphatase 38 - 126 U/L 126  Albumin 3.5 - 5.0 g/dL 3.7  AST 15 - 41 U/L 11 (L)  ALT 0 - 44 U/L 16  Total Protein 6.5 - 8.1 g/dL 6.4 (L)  Total Bilirubin 0.3 - 1.2 mg/dL 0.7  GFR, Est Non African American >60 mL/min 42 (L)  (H): Data is abnormally high (L): Data is abnormally low   ASSESSMENT / PLAN / RECOMMENDATIONS:   1) Type 2 Diabetes Mellitus, poorly controlled, with CKD III and neuropathic and macrovascular complications - Most recent A1c of 12.6 %. Goal A1c < 7.5 %.    -He is currently living with his son [name], who has been managing his medications due to the patient's cognitive impairment -We switched Janumet to Jardiance due to a GFR of less than 45 -Patient continues with hyperglycemia , I will increase his Lantus as below, they were advised to increase Lantus by 4 units every week to recheck fasting glucose of less than 200 Mg/DL, preferably 956 Mg/DL   MEDICATIONS:  Increase Lantus 16 units daily Continue Jardiance 25 mg daily   EDUCATION / INSTRUCTIONS: BG monitoring  instructions: Patient is instructed to check his blood sugars 1 times a day. Call [facility] if: BG  persistently < 70  I reviewed the Rule of 15 for the treatment of hypoglycemia in detail with the patient. Literature supplied.    2) Diabetic complications:  Eye: Does not have known diabetic retinopathy.  Neuro/ Feet: Does have known diabetic peripheral neuropathy .  Renal: Patient does  have known baseline CKD. He   is  on an ACEI/ARB at present.     F/U in 3 months    Signed electronically by: Lyndle Herrlich, MD  [facility] End[facility]ology  [facility] [address] [place] [place], [place] [address] Phone: [phone] FAX: [phone]   CC: Virgina Organ [address] [address] [place] [address] Phone: [phone]  Fax: [phone]  Return to Endocrinology clinic as below: Future Appointments  Date Time Provider Department Center  06/06/2023  9:00 AM Marcos Eke, PA-C [facility] None  08/15/2023  9:00 AM Donato Schultz, DO [facility]  11/01/2023  8:15 AM [facility] None  11/01/2023  8:40 AM Briant Cedar, PA-C [facility] None

## 2023-05-13 ENCOUNTER — Encounter: Payer: Self-pay | Admitting: Internal Medicine

## 2023-05-13 ENCOUNTER — Ambulatory Visit: Payer: Medicare HMO | Admitting: Internal Medicine

## 2023-05-13 VITALS — BP 122/80 | HR 62 | Ht 64.0 in | Wt 126.0 lb

## 2023-05-13 DIAGNOSIS — E1165 Type 2 diabetes mellitus with hyperglycemia: Secondary | ICD-10-CM

## 2023-05-13 DIAGNOSIS — Z794 Long term (current) use of insulin: Secondary | ICD-10-CM

## 2023-05-13 DIAGNOSIS — N1832 Chronic kidney disease, stage 3b: Secondary | ICD-10-CM

## 2023-05-13 DIAGNOSIS — E1122 Type 2 diabetes mellitus with diabetic chronic kidney disease: Secondary | ICD-10-CM

## 2023-05-13 DIAGNOSIS — Z7984 Long term (current) use of oral hypoglycemic drugs: Secondary | ICD-10-CM

## 2023-05-13 LAB — POCT GLYCOSYLATED HEMOGLOBIN (HGB A1C): Hemoglobin A1C: 12.6 % — AB (ref 4.0–5.6)

## 2023-05-13 MED ORDER — LANTUS SOLOSTAR 100 UNIT/ML ~~LOC~~ SOPN: 20.0000 [IU] | PEN_INJECTOR | Freq: Every day | SUBCUTANEOUS | 11 refills | Status: DC

## 2023-05-13 NOTE — Patient Instructions (Addendum)
-   Continue Jardiance 25  mg , 1 tablet every morning  - Increase lantus 16 units daily   After a week , if fasting sugars are over 200 , please add another 4 units , weekly to reach a fasting of 180 mg/dL . Once you reach that point remain on that dose.       HOW TO TREAT LOW BLOOD SUGARS (Blood sugar LESS THAN 70 MG/DL) Please follow the RULE OF 15 for the treatment of hypoglycemia treatment (when your (blood sugars are less than 70 mg/dL)   STEP 1: Take 15 grams of carbohydrates when your blood sugar is low, which includes:  3-4 GLUCOSE TABS  OR 3-4 OZ OF JUICE OR REGULAR SODA OR ONE TUBE OF GLUCOSE GEL    STEP 2: RECHECK blood sugar in 15 MINUTES STEP 3: If your blood sugar is still low at the 15 minute recheck --> then, go back to STEP 1 and treat AGAIN with another 15 grams of carbohydrates.

## 2023-05-17 ENCOUNTER — Telehealth: Payer: Self-pay | Admitting: *Deleted

## 2023-05-17 NOTE — Telephone Encounter (Signed)
Attempted to contact patient per Dr. Derek Mound directions:  "Please let [name] know his MGUS labs are stable. We will plan to see him back as scheduled in May 2025" No answer. LVM requesting patient contact office at his convenience for results

## 2023-05-23 DIAGNOSIS — H401121 Primary open-angle glaucoma, left eye, mild stage: Secondary | ICD-10-CM | POA: Diagnosis not present

## 2023-05-23 DIAGNOSIS — H401112 Primary open-angle glaucoma, right eye, moderate stage: Secondary | ICD-10-CM | POA: Diagnosis not present

## 2023-06-03 ENCOUNTER — Ambulatory Visit (HOSPITAL_COMMUNITY)
Admission: RE | Admit: 2023-06-03 | Discharge: 2023-06-03 | Disposition: A | Discharge status: Home / Self Care | Payer: Medicare HMO | Source: Ambulatory Visit | Attending: Hematology and Oncology | Admitting: Hematology and Oncology

## 2023-06-03 DIAGNOSIS — M898X9 Other specified disorders of bone, unspecified site: Secondary | ICD-10-CM | POA: Diagnosis not present

## 2023-06-03 DIAGNOSIS — M8580 Other specified disorders of bone density and structure, unspecified site: Secondary | ICD-10-CM | POA: Diagnosis not present

## 2023-06-03 DIAGNOSIS — D472 Monoclonal gammopathy: Secondary | ICD-10-CM | POA: Diagnosis not present

## 2023-06-03 DIAGNOSIS — I739 Peripheral vascular disease, unspecified: Secondary | ICD-10-CM | POA: Diagnosis not present

## 2023-06-06 ENCOUNTER — Ambulatory Visit: Payer: Medicare HMO | Admitting: Physician Assistant

## 2023-06-06 ENCOUNTER — Encounter: Payer: Self-pay | Admitting: Physician Assistant

## 2023-06-06 VITALS — BP 136/58 | HR 59 | Ht 64.0 in | Wt 127.0 lb

## 2023-06-06 DIAGNOSIS — G309 Alzheimer's disease, unspecified: Secondary | ICD-10-CM | POA: Diagnosis not present

## 2023-06-06 DIAGNOSIS — F0280 Dementia in other diseases classified elsewhere without behavioral disturbance: Secondary | ICD-10-CM | POA: Diagnosis not present

## 2023-06-06 MED ORDER — MEMANTINE HCL 5 MG PO TABS: 5.0000 mg | ORAL_TABLET | Freq: Two times a day (BID) | ORAL | 3 refills | Status: DC

## 2023-06-06 NOTE — Patient Instructions (Addendum)
It was a pleasure to see you today at our office.   Recommendations:  Follow up in 6 months Increase Memantine 5 mg twice a day    Whom to call:  Memory  decline, memory medications: Call our office [phone]   For psychiatric meds, mood meds: Please have your primary care physician manage these medications.   Counseling regarding caregiver distress, including caregiver depression, anxiety and issues regarding community resources, adult day care programs, adult living facilities, or memory care questions:   Feel free to contact Misty Lisabeth Register, Social Worker at [phone]   For assessment of decision of mental capacity and competency:  Call Dr. Erick Blinks, geriatric psychiatrist at [phone]  For guidance in geriatric dementia issues please call [facility] [phone]  For guidance regarding [facility] and if placement were needed at the facility, contact Sidney Ace, Social Worker tel: [phone]  If you have any severe symptoms of a stroke, or other severe issues such as confusion,severe chills or fever, etc call 911 or go to the ER as you may need to be evaluated further       RECOMMENDATIONS FOR ALL PATIENTS WITH MEMORY PROBLEMS: 1. Continue to exercise (Recommend 30 minutes of walking everyday, or 3 hours every week) 2. Increase social interactions - continue going to [place] and enjoy social gatherings with friends and family 3. Eat healthy, avoid fried foods and eat more fruits and vegetables 4. Maintain adequate blood pressure, blood sugar, and blood cholesterol level. Reducing the risk of stroke and cardiovascular disease also helps promoting better memory. 5. Avoid stressful situations. Live a simple life and avoid aggravations. Organize your time and prepare for the next day in anticipation. 6. Sleep well, avoid any interruptions of sleep and avoid any distractions in the bedroom that may interfere with adequate  sleep quality 7. Avoid sugar, avoid sweets as there is a strong link between excessive sugar intake, diabetes, and cognitive impairment We discussed the Mediterranean diet, which has been shown to help patients reduce the risk of progressive memory disorders and reduces cardiovascular risk. This includes eating fish, eat fruits and green leafy vegetables, nuts like almonds and hazelnuts, walnuts, and also use olive oil. Avoid fast foods and fried foods as much as possible. Avoid sweets and sugar as sugar use has been linked to worsening of memory function.  There is always a concern of gradual progression of memory problems. If this is the case, then we may need to adjust level of care according to patient needs. Support, both to the patient and caregiver, should then be put into place.    FALL PRECAUTIONS: Be cautious when walking. Scan the area for obstacles that may increase the risk of trips and falls. When getting up in the mornings, sit up at the edge of the bed for a few minutes before getting out of bed. Consider elevating the bed at the head end to avoid drop of blood pressure when getting up. Walk always in a well-lit room (use night lights in the walls). Avoid area rugs or power cords from appliances in the middle of the walkways. Use a walker or a cane if necessary and consider physical therapy for balance exercise. Get your eyesight checked regularly.  FINANCIAL OVERSIGHT: Supervision, especially oversight when making financial decisions or transactions is also recommended.  HOME SAFETY: Consider the safety of the kitchen when operating appliances like stoves, microwave oven, and blender. Consider having supervision and share cooking responsibilities until no longer  able to participate in those. Accidents with firearms and other hazards in the house should be identified and addressed as well.   ABILITY TO BE LEFT ALONE: If patient is unable to contact 911 operator, consider using [employer],  or when the need is there, arrange for someone to stay with patients. Smoking is a fire hazard, consider supervision or cessation. Risk of wandering should be assessed by caregiver and if detected at any point, supervision and safe proof recommendations should be instituted.  MEDICATION SUPERVISION: Inability to self-administer medication needs to be constantly addressed. Implement a mechanism to ensure safe administration of the medications.   DRIVING: Regarding driving, in patients with progressive memory problems, driving will be impaired. We advise to have someone else do the driving if trouble finding directions or if minor accidents are reported. Independent driving assessment is available to determine safety of driving.   If you are interested in the driving assessment, you can contact the following:  [employer] in [place] [phone]  [facility] [phone]  [facility] [phone]  [facility] [phone] or [phone]

## 2023-06-06 NOTE — Progress Notes (Signed)
Assessment/Plan:   Dementia due to Alzheimer's disease, mild   [name] is a very pleasant 89 y.o. RH male with a history of hypertension, hyperlipidemia, bilateral carotid artery stenosis, DM2, smoldering multiple myeloma, CKD stage IIIb, osteoporosis, BPH, history of prostate cancer, anemia of chronic disease, monoclonal gammopathy, glaucoma, vitamin D deficiency, CAD and a diagnosis of mild dementia due to Alzheimer's disease per neuropsych evaluation on September 2024, seen today in follow up for memory loss. Patient is currently on memantine 5 mg nightly, tolerating well. Patient is able to participate on his ADLs and to drive without difficulties. Mood is good     Follow up in 6  months. Continue memantine 5 mg increase to twice daily for better coverage (history of bradycardia) Recommend good control of her cardiovascular risk factors Continue to control mood as per PCP     Subjective:    This patient is accompanied in the office by his son who supplements the history.  Previous records as well as any outside records available were reviewed prior to todays visit. Patient was last seen on 11/29/2022, last MoCA on March 2024 was 16/30.   Any changes in memory since last visit? " Doing well, no changes, maybe better than before". LTM is good    He [facility]ends the senior center, and remains active. repeats oneself?  Endorsed, especially with appointments Disoriented when walking into a room?  Patient denies    Leaving objects?  May misplace things but not in unusual places   Wandering behavior?  denies   Any personality changes since last visit?  denies   Any worsening depression?:  Denies.  "I have a good circle of friends " Hallucinations or paranoia?  Denies.   Seizures? denies    Any sleep changes?  Denies vivid dreams, REM behavior or sleepwalking   Sleep apnea?   Denies.   Any hygiene concerns? Denies.  Independent of bathing and dressing?  Endorsed  Does the patient  needs help with medications?  Son is in charge   Who is in charge of the finances?  Patient is in charge with close supervision by his son     Any changes in appetite?  denies     Patient have trouble swallowing? Denies.   Does the patient cook? No Any headaches?   denies   Chronic back pain  denies   Ambulates with difficulty? Denies.  He runs the seniors 1500 meter dash  Recent falls or head injuries? denies     Unilateral weakness, numbness or tingling? denies   Any tremors?  Denies   Any anosmia?  Denies   Any incontinence of urine?  Endorsed, urgency.  Wears Depends.  Any bowel dysfunction?   Denies      Patient lives with his son   Does the patient drive?  Yes, only short distances such as to [employer], friends homes, grocery store denies getting lost    Neuropsych evaluation 03/01/2023 "briefly, results suggested severe impairment surrounding all aspects of learning and memory. An additional impairment was exhibited across semantic fluency, while performance variability was exhibited across processing speed and executive functioning. Regarding the cause of his mild dementia presentation, I do have concerns surrounding a neurodegenerative illness, namely Alzheimer's disease. Across memory testing, [name] did not benefit from repeated information across learning trials and was fully amnestic (i.e., 0% retention) across both verbal memory tasks after a brief delay. He only exhibited 13% retention across the remaining visual memory task. Performances across  yes/no recognition trials were consistently poor. Taken together, this suggests evidence for rapid forgetting and a pronounced storage impairment, both of which are the hallmark testing patterns associated with this illness. Further impairment surrounding semantic fluency and executive variability would represent typical disease progression. Intact confrontation naming is encouraging. However, concerns surrounding a dementia due to  Alzheimer's disease presentation remain.  "  MRI of the brain personally reviewed was without acute findings, moderate volume loss, minimal chronic microvascular changes     Initial visit 08/31/2022 How long did patient have memory difficulties? "For the last 2 year he was losing his wallet, having difficulty remembering recent conversations and people names ". "Since his fall in January, his memory worsened, forgetting within minutes, not knowing where the money is, date, remembering appointments and simple tasks". "When I asked him Who is your MD, he opened the medicine cabinet to find out the name"  repeats oneself?  Endorsed, for the last 1.5 years, worse recently  Disoriented when walking into a room?  Patient denies except occasionally not remembering what patient came to the room for    Leaving objects in unusual places?  "he does misplace things".   Wandering behavior? denies   Any personality changes since last visit? denies   Any history of depression?:  denies   Hallucinations or paranoia?  denies   Seizures? denies    Any sleep changes?  Denies vivid dreams, REM behavior or sleepwalking . Takes Mg to help him relax Sleep apnea? denies   Any hygiene concerns?  denies   Independent of bathing and dressing?  Endorsed  Does the patient need help with medications?  Son is in charge because he was not taking medications correctly  Who is in charge of the finances?   Patient is in charge but sons are involved after some confusion with the bills    Any changes in appetite? "It is ok" " Eats in a restaurant several times a week . Seldom he cooks a breakfast " Patient have trouble swallowing?  denies   Does the patient cook?seldom  Any kitchen accidents such as leaving the stove on? Patient denies   Any headaches?  denies   Chronic back pain?  denies   Ambulates with difficulty? 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  Endorsed, last February he fell while  gardening, mechanical, with some residual right-sided low back pain, no loss of consciousness, no head injury. Vision changes? Denies, had corneal implants. Unilateral weakness, numbness or tingling?  denies   Any tremors?  denies   Any anosmia?  denies   Any incontinence of urine? For the last 5 months he has more urgency.  Any bowel dysfunction?    denies      Patient lives with his son   History of heavy alcohol intake? denies   History of heavy tobacco use? denies   Family history of dementia?  No Does patient drive?  Short distances to [place], his friend home, grocery shop, but may have gotten lost going to the dentist       PREVIOUS MEDICATIONS:   CURRENT MEDICATIONS:  Outpatient Encounter Medications as of 06/06/2023  Medication Sig   ABRYSVO 120 MCG/0.5ML injection    acetaminophen (TYLENOL) 500 MG tablet Take 2 tablets (1,000 mg total) by mouth every 6 (six) hours as needed.   Alcohol Swabs PADS Use as directed once a day   alendronate (FOSAMAX) 70 MG tablet TAKE 1 TABLET  ONCE WEEKLY *TAKE WITH A FULL GLASS OF WATER ON AN EMPTY STOMACH*   amLODipine (NORVASC) 5 MG tablet Take 1.5 tablets (7.5 mg total) by mouth daily.   aspirin 81 MG tablet Take 81 mg by mouth daily.   atorvastatin (LIPITOR) 20 MG tablet Take 1 tablet (20 mg total) by mouth daily.   Blood Glucose Calibration (TRUE METRIX LEVEL 1) Low SOLN USE AS DIRECTED   Blood Glucose Calibration (TRUE METRIX LEVEL 3) High SOLN Use to check controls on glucometer strips every 30 days or with each new bottle of strips (whichever comes first).   Blood Glucose Monitoring Suppl (TRUE METRIX METER) w/Device KIT USE AS DIRECTED ONCE A DAY   BOOSTRIX 5-2.5-18.5 LF-MCG/0.5 injection    brimonidine-timolol (COMBIGAN) 0.2-0.5 % ophthalmic solution Place 1 drop into both eyes 2 (two) times daily.   empagliflozin (JARDIANCE) 25 MG TABS tablet Take 1 tablet (25 mg total) by mouth daily before breakfast.   fish oil-omega-3 fatty  acids 1000 MG capsule Take 2 g by mouth daily.   FLUZONE HIGH-DOSE 0.5 ML injection    glucose blood (TRUE METRIX BLOOD GLUCOSE TEST) test strip USE TO CHECK BLOOD GLUCOSE ONCE A DAY   insulin glargine (LANTUS SOLOSTAR) 100 UNIT/ML Solostar Pen Inject 20 Units into the skin daily.   Insulin Pen Needle 32G X 4 MM MISC 1 Device by Does not apply route daily in the afternoon.   latanoprost (XALATAN) 0.005 % ophthalmic solution SMARTSIG:In Eye(s)   losartan-hydrochlorothiazide (HYZAAR) 100-25 MG tablet Take 1 tablet by mouth daily.   metoprolol succinate (TOPROL-XL) 50 MG 24 hr tablet TAKE 1 TABLET EVERY DAY WITH OR IMMEDIATELY FOLLOWING A MEAL   Multiple Vitamin (MULTIVITAMIN) tablet Take 1 tablet by mouth daily.   SHINGRIX injection    SPIKEVAX syringe    tamsulosin (FLOMAX) 0.4 MG CAPS capsule TAKE 1 CAPSULE EVERY DAY AFTER SUPPER   TRUEplus Lancets 33G MISC Use to check blood sugar once a day.  DX  E11.9   vitamin B-12 (CYANOCOBALAMIN) 100 MCG tablet Take 100 mcg by mouth daily.   [DISCONTINUED] memantine (NAMENDA) 5 MG tablet Take 1 tablet (5 mg total) by mouth at bedtime.   COLLAGEN PO Take by mouth. 1 scoop daily (Patient not taking: Reported on 06/06/2023)   memantine (NAMENDA) 5 MG tablet Take 1 tablet (5 mg total) by mouth 2 (two) times daily.   No facility-administered encounter medications on file as of 06/06/2023.        No data to display            08/31/2022   10:00 AM  Montreal Cognitive Assessment   Visuospatial/ Executive (0/5) 3  Naming (0/3) 3  Attention: Read list of digits (0/2) 1  Attention: Read list of letters (0/1) 1  Attention: Serial 7 subtraction starting at 100 (0/3) 1  Language: Repeat phrase (0/2) 0  Language : Fluency (0/1) 0  Abstraction (0/2) 2  Delayed Recall (0/5) 0  Orientation (0/6) 5  Total 16  Adjusted Score (based on education) 16    Objective:     PHYSICAL EXAMINATION:    VITALS:   Vitals:   06/06/23 0848  BP: (!) 136/58   Pulse: (!) 59  SpO2: 98%  Weight: 127 lb (57.6 kg)  Height: 5\' 4"  (1.626 m)    GEN:  The patient appears stated age and is in NAD. HEENT:  Normocephalic, atraumatic.   Neurological examination:  General: NAD, well-groomed, appears stated age. Orientation: The patient  is alert. Oriented to person, place and date Cranial nerves: There is good facial symmetry.The speech is fluent and clear. No aphasia or dysarthria. Fund of knowledge is appropriate. Recent and remote memory are impaired. Attention and concentration are reduced.  Able to name objects and repeat phrases.  Hearing is intact to conversational tone.  Sensation: Sensation is intact to light touch throughout Motor: Strength is at least antigravity x4. DTR's 2/4 in UE/LE     Movement examination: Tone: There is normal tone in the UE/LE Abnormal movements:  no tremor.  No myoclonus.  No asterixis.   Coordination:  There is no decremation with RAM's. Normal finger to nose  Gait and Station: The patient has no difficulty arising out of a deep-seated chair without the use of the hands. The patient's stride length is good.  Gait is cautious and narrow.    Thank you for allowing [place] the opportunity to participate in the care of this nice patient. Please do not hesitate to contact us for any questions or concerns.   Total time spent on today's visit was 35 minutes dedicated to this patient today, preparing to see patient, examining the patient, ordering tests and/or medications and counseling the patient, documenting clinical information in the EHR or other health record, independently interpreting results and communicating results to the patient/family, discussing treatment and goals, answering patient's questions and coordinating care.  Cc:  Donato Schultz, DO  [facility] 06/06/2023 9:08 AM

## 2023-06-20 ENCOUNTER — Telehealth: Payer: Self-pay | Admitting: *Deleted

## 2023-06-20 ENCOUNTER — Ambulatory Visit: Payer: Medicare HMO | Admitting: Internal Medicine

## 2023-06-20 NOTE — Telephone Encounter (Signed)
 TCT patient's son, Cletis Clack  regarding recent bone scan results. Spoke with him. Advised hat his bone scan did not show any evidence of lytic lesions. At this time his labs appear stable. Will plan to see him back as scheduled in May 2025. His son was appreciative of this call and he will let his dad know the results. Pt starting to show some signs of dementia-memory etc. He appreciated the call.

## 2023-06-20 NOTE — Telephone Encounter (Signed)
-----   Message from Norleen ONEIDA Kidney IV sent at 06/19/2023  8:03 PM EST ----- Please let Mr. Demma know that his bone scan did not show any evidence of lytic lesions.  At this time his labs appear stable.  Will plan to see him back as scheduled in May 2025. ----- Message ----- From: Rebecka, Rad Results In Sent: 06/16/2023  10:36 PM EST To: Norleen ONEIDA Kidney [place], MD

## 2023-07-30 ENCOUNTER — Other Ambulatory Visit: Payer: Self-pay | Admitting: Internal Medicine

## 2023-08-11 NOTE — Progress Notes (Signed)
 Name: [name]  Age/ Sex: 89 y.o., male   MRN/ DOB: [id number], 1931/09/14     PCP: Zola Button, Grayling Congress, DO   Reason for Endocrinology Evaluation: Type 2 Diabetes Mellitus  Initial Endocrine Consultative Visit: 02/06/2020    PATIENT IDENTIFIER: [name] is a 89 y.o. male with a past medical history of T2DM, CAD and HTN. The patient has followed with Endocrinology clinic since 02/06/2020 for consultative assistance with management of his diabetes.  DIABETIC HISTORY:  [name] was diagnosed with DM in 2008, has been on Glimepiride for years, janumet started in 01/2020. His hemoglobin A1c has ranged from 6.7% in 2017, peaking at 9.6%in 2021.   On initial visit his A1c was 9.6 % , he was just started on Janumet and we did not make any changes and continued Glimepiride.  Discontinued Janumet and started Jardiance only due to low GFR 08/2021    SUBJECTIVE:   During the last visit (05/13/2023): A1c 12.6%.     Today (08/12/2023): [name] is here for a follow up on diabetes management.  He is accompanied by his son Rosanne Ashing He checks glucose daily  at home.   Patient lives with his son Trey Paula, who has been managing his medications.    He continues to follow-up with neurology for dementia    Denies constipation or diarrhea  Denies nausea or vomiting  Denies urinary issues    HOME DIABETES REGIMEN:  Lantus 20 units daily Jardiance 25 mg daily    Statin: yes ACE-I/ARB: yes   GLUCOSE LOG:  n/a   DIABETIC COMPLICATIONS: Microvascular complications:  CKD III Denies: retinopathy, neuropathy Last Eye Exam: Completed 12/13/2022  Macrovascular complications:  CAD ( S/P stent) Denies:  CVA, PVD   HISTORY:  Past Medical History:  Past Medical History:  Diagnosis Date   Anemia, unspecified 10/27/2009   BPH with urinary obstruction 12/13/2012   CAD (coronary artery disease)    LHC 03/25/11 by Dr. Excell Seltzer:  pLAD 99%, oCFX 20-30%, pOM1 40%, dAVCFX 20-30%.  EF was normal  on nuclear study.  He was treated with a Promus DES to his pLAD.    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/2023   Monoclonal gammopathy 04/06/2019   Multiple closed fractures of ribs of left side 03/21/2019   Multiple myeloma not having achieved remission 08/13/2022   Osteoporosis 03/21/2019   Peripheral vascular disorder due to diabetes mellitus 08/13/2022   Personal history of colonic polyps 1996   villous adenoma   Primary open angle glaucoma (POAG) of left eye, mild stage 02/04/2019   Primary open angle glaucoma (POAG) of right eye, moderate stage 02/04/2019   Prostate cancer 02/15/2017   Stage 3b chronic kidney disease 02/06/2020   Type II diabetes mellitus 07/04/2006   Vitamin D deficiency 01/28/2021   Past Surgical History:  Past Surgical History:  Procedure Laterality Date   CARDIAC CATHETERIZATION  03/25/2011   cataract Bilateral 2010   corenea implants  june and sept 2018   dr Molli [facility]   CORONARY STENT PLACEMENT  03/25/2011   RADIOACTIVE SEED IMPLANT N/A 05/06/2017   Procedure: RADIOACTIVE SEED IMPLANT/BRACHYTHERAPY IMPLANT;  Surgeon: Ihor Gully, MD;  Location:  [facility];  Service: Urology;  Laterality: N/A;   SPACE OAR INSTILLATION N/A 05/06/2017   Procedure: SPACE OAR INSTILLATION;  Surgeon: Ihor Gully, MD;  Location: [facility];  Service: Urology;  Laterality: N/A;   Social History:  reports that he quit smoking about 65 years ago. His smoking use included cigarettes. He started smoking about 68 years ago. He has a 0.8 pack-year smoking history. He has never used smokeless tobacco. He reports that he  does not currently use alcohol. He reports that he does not use drugs. Family History:  Family History  Problem Relation Age of Onset   Stomach cancer Mother 79   Coronary artery disease Father 45       deceased   Healthy Son    Healthy Son    Healthy Son      HOME MEDICATIONS: Allergies as of 08/12/2023   No Known Allergies      Medication List        Accurate as of August 12, 2023  8:43 AM. If you have any questions, ask your nurse or doctor.          Abrysvo 120 MCG/0.5ML injection Generic drug: RSV bivalent vaccine   acetaminophen 500 MG tablet Commonly known as: TYLENOL Take 2 tablets (1,000 mg total) by mouth every 6 (six) hours as needed.   Alcohol Swabs Pads Use as directed once a day   alendronate 70 MG tablet Commonly known as: FOSAMAX TAKE 1 TABLET ONCE WEEKLY *TAKE WITH A FULL GLASS OF WATER ON AN EMPTY STOMACH*   amLODipine 5 MG tablet Commonly known as: NORVASC Take 1.5 tablets (7.5 mg total) by mouth daily.   aspirin 81 MG tablet Take 81 mg by mouth daily.   atorvastatin 20 MG tablet Commonly known as: LIPITOR Take 1 tablet (20 mg total) by mouth daily.   Boostrix 5-2.5-18.5 LF-MCG/0.5 injection Generic drug: Tdap   brimonidine 0.2 % ophthalmic solution Commonly known as: ALPHAGAN SMARTSIG:In Eye(s)   brimonidine-timolol 0.2-0.5 % ophthalmic solution Commonly known as: COMBIGAN Place 1 drop into both eyes 2 (two) times daily.   COLLAGEN PO Take by mouth. 1 scoop daily   dorzolamide-timolol 2-0.5 % ophthalmic solution Commonly known as: COSOPT SMARTSIG:In Eye(s)   fish oil-omega-3 fatty acids 1000 MG capsule Take 2 g by mouth daily.   Fluzone High-Dose 0.5 ML injection Generic drug: Influenza vac split quadrivalent PF   Insulin Pen Needle 32G X 4 MM Misc 1 Device by Does not apply route daily in the afternoon.   Jardiance 25 MG Tabs tablet Generic drug: empagliflozin TAKE 1 TABLET BY MOUTH DAILY BEFORE BREAKFAST.   Lantus  SoloStar 100 UNIT/ML Solostar Pen Generic drug: insulin glargine Inject 20 Units into the skin daily.   latanoprost 0.005 % ophthalmic solution Commonly known as: XALATAN SMARTSIG:In Eye(s)   losartan-hydrochlorothiazide 100-25 MG tablet Commonly known as: HYZAAR Take 1 tablet by mouth daily.   memantine 5 MG tablet Commonly known as: NAMENDA Take 1 tablet (5 mg total) by mouth 2 (two) times daily.   metoprolol succinate 50 MG 24 hr tablet Commonly known as: TOPROL-XL TAKE 1 TABLET EVERY DAY WITH OR IMMEDIATELY FOLLOWING A MEAL   multivitamin tablet Take 1 tablet by mouth daily.   Shingrix injection Generic drug: Zoster Vaccine Adjuvanted   Spikevax syringe Generic drug: COVID-19 mRNA vaccine   tamsulosin 0.4 MG Caps capsule Commonly known as: FLOMAX TAKE 1 CAPSULE EVERY DAY AFTER SUPPER   True Metrix Blood  Glucose Test test strip Generic drug: glucose blood USE TO CHECK BLOOD GLUCOSE ONCE A DAY   True Metrix Level 3 High Soln Use to check controls on glucometer strips every 30 days or with each new bottle of strips (whichever comes first).   True Metrix Level 1 Low Soln USE AS DIRECTED   True Metrix Meter w/Device Kit USE AS DIRECTED ONCE A DAY   TRUEplus Lancets 33G Misc Use to check blood sugar once a day.  DX  E11.9   vitamin B-12 100 MCG tablet Commonly known as: CYANOCOBALAMIN Take 100 mcg by mouth daily.         OBJECTIVE:   Vital Signs: BP 122/80 (BP Location: Left Arm, Patient Position: Sitting, Cuff Size: Small)   Pulse 61   Ht 5\' 4"  (1.626 m)   Wt 125 lb (56.7 kg)   SpO2 93%   BMI 21.46 kg/m   Wt Readings from Last 3 Encounters:  08/12/23 125 lb (56.7 kg)  06/06/23 127 lb (57.6 kg)  05/13/23 126 lb (57.2 kg)     Exam: General: Pt appears well and is in NAD  Lungs: Clear with good BS bilat   Heart: RRR   Extremities: No pretibial edema.  Neuro: MS is good with appropriate affect, pt is alert and Ox3   DM foot exam:  08/12/2023 The skin of the feet is without sores or ulcerations. The pedal pulses are 1+ on right and 1+ on left. The sensation is decreased  to a screening 5.07, 10 gram monofilament on the right          DATA REVIEWED:  Lab Results  Component Value Date   HGBA1C 11.5 (A) 08/12/2023   HGBA1C 12.6 (A) 05/13/2023   HGBA1C 12.9 (A) 12/27/2022    Latest Reference Range & Units 05/02/23 08:14  Sodium 135 - 145 mmol/L 141  Potassium 3.5 - 5.1 mmol/L 3.9  Chloride 98 - 111 mmol/L 102  CO2 22 - 32 mmol/L 33 (H)  Glucose 70 - 99 mg/dL 161 (H)  BUN 8 - 23 mg/dL 23  Creatinine 0.96 - 0.45 mg/dL 4.09 (H)  Calcium 8.9 - 10.3 mg/dL 9.7  Anion gap 5 - 15  6  Alkaline Phosphatase 38 - 126 U/L 115  Albumin 3.5 - 5.0 g/dL 4.0  AST 15 - 41 U/L 14 (L)  ALT 0 - 44 U/L 21  Total Protein 6.5 - 8.1 g/dL 7.0  Total Bilirubin <8.1 mg/dL 0.7  GFR, Est Non African American >60 mL/min 40 (L)  (H): Data is abnormally high (L): Data is abnormally low   ASSESSMENT / PLAN / RECOMMENDATIONS:   1) Type 2 Diabetes Mellitus, poorly controlled, with CKD III and neuropathic and macrovascular complications - Most recent A1c of 11.5 %. Goal A1c < 8.5 %.    -He is currently living with his son Trey Paula, who has been managing his medications due to the patient's dementia -Due to dementia and advanced age I will go prevent hypoglycemia and symptomatic/severe hyperglycemia -We switched Janumet to Jardiance due to a GFR of less than 45 -Patient continues with hyperglycemia , I will increase his Lantus as below MEDICATIONS:  Increase Lantus 26 units daily Continue Jardiance 25 mg daily   EDUCATION / INSTRUCTIONS: BG monitoring instructions: Patient is instructed to check his blood sugars 1 times a day. Call [facility] if: BG persistently < 70  I reviewed the Rule of 15 for the treatment of hypoglycemia in detail with the patient.  Literature supplied.    2) Diabetic complications:  Eye: Does  not have known diabetic retinopathy.  Neuro/ Feet: Does have known diabetic peripheral neuropathy .  Renal: Patient does  have known baseline CKD. He   is  on an ACEI/ARB at present.   F/U in 4 months    Signed electronically by: Lyndle Herrlich, MD  [facility]  [facility] [address] [place], [place] [address] Phone: [phone] FAX: [phone]   CC: Donato Schultz, DO [address] [place] [address] Phone: [phone]  Fax: [phone]  Return to Endocrinology clinic as below: Future Appointments  Date Time Provider Department Center  08/12/2023  8:50 AM Danikah Budzik, Konrad Dolores, MD [facility] None  08/15/2023  9:00 AM Donato Schultz, DO [facility]  09/26/2023 11:00 AM [facility] HS VASC 2 [facility] VVS  09/26/2023 12:00 PM Nada Libman, MD [facility]  11/01/2023  8:15 AM [facility] LAB [facility] None  11/01/2023  8:40 AM Georga Kaufmann T, PA-C [facility] None  12/05/2023  9:00 AM Gwynneth Munson Sung Amabile, PA-C [facility] None

## 2023-08-12 ENCOUNTER — Encounter: Payer: Self-pay | Admitting: Internal Medicine

## 2023-08-12 ENCOUNTER — Ambulatory Visit: Payer: Medicare HMO | Admitting: Internal Medicine

## 2023-08-12 VITALS — BP 122/80 | HR 61 | Ht 64.0 in | Wt 125.0 lb

## 2023-08-12 DIAGNOSIS — E1165 Type 2 diabetes mellitus with hyperglycemia: Secondary | ICD-10-CM | POA: Diagnosis not present

## 2023-08-12 DIAGNOSIS — E1122 Type 2 diabetes mellitus with diabetic chronic kidney disease: Secondary | ICD-10-CM

## 2023-08-12 DIAGNOSIS — E1142 Type 2 diabetes mellitus with diabetic polyneuropathy: Secondary | ICD-10-CM

## 2023-08-12 DIAGNOSIS — N1832 Chronic kidney disease, stage 3b: Secondary | ICD-10-CM

## 2023-08-12 DIAGNOSIS — Z794 Long term (current) use of insulin: Secondary | ICD-10-CM

## 2023-08-12 LAB — POCT GLYCOSYLATED HEMOGLOBIN (HGB A1C): Hemoglobin A1C: 11.5 % — AB (ref 4.0–5.6)

## 2023-08-12 LAB — POCT GLUCOSE (DEVICE FOR HOME USE): Glucose Fasting, POC: 209 mg/dL — AB (ref 70–99)

## 2023-08-12 MED ORDER — INSULIN PEN NEEDLE 32G X 4 MM MISC
1.0000 | Freq: Every day | 3 refills | Status: AC
Start: 2023-08-12 — End: ?

## 2023-08-12 MED ORDER — EMPAGLIFLOZIN 25 MG PO TABS: 25.0000 mg | ORAL_TABLET | Freq: Every day | ORAL | 1 refills | Status: DC

## 2023-08-12 MED ORDER — LANTUS SOLOSTAR 100 UNIT/ML ~~LOC~~ SOPN: 26.0000 [IU] | PEN_INJECTOR | Freq: Every day | SUBCUTANEOUS | 4 refills | Status: DC

## 2023-08-12 NOTE — Patient Instructions (Addendum)
-   Continue Jardiance 25  mg , 1 tablet every morning  - Increase lantus 26 units daily       HOW TO TREAT LOW BLOOD SUGARS (Blood sugar LESS THAN 70 MG/DL) Please follow the RULE OF 15 for the treatment of hypoglycemia treatment (when your (blood sugars are less than 70 mg/dL)   STEP 1: Take 15 grams of carbohydrates when your blood sugar is low, which includes:  3-4 GLUCOSE TABS  OR 3-4 OZ OF JUICE OR REGULAR SODA OR ONE TUBE OF GLUCOSE GEL    STEP 2: RECHECK blood sugar in 15 MINUTES STEP 3: If your blood sugar is still low at the 15 minute recheck --> then, go back to STEP 1 and treat AGAIN with another 15 grams of carbohydrates.

## 2023-08-15 ENCOUNTER — Ambulatory Visit (INDEPENDENT_AMBULATORY_CARE_PROVIDER_SITE_OTHER): Payer: Medicare HMO | Admitting: Family Medicine

## 2023-08-15 ENCOUNTER — Encounter: Payer: Self-pay | Admitting: Family Medicine

## 2023-08-15 VITALS — BP 132/62 | HR 54 | Temp 98.1°F | Resp 18 | Ht 64.0 in | Wt 126.0 lb

## 2023-08-15 DIAGNOSIS — E1169 Type 2 diabetes mellitus with other specified complication: Secondary | ICD-10-CM

## 2023-08-15 DIAGNOSIS — E1165 Type 2 diabetes mellitus with hyperglycemia: Secondary | ICD-10-CM

## 2023-08-15 DIAGNOSIS — Z794 Long term (current) use of insulin: Secondary | ICD-10-CM | POA: Diagnosis not present

## 2023-08-15 DIAGNOSIS — E785 Hyperlipidemia, unspecified: Secondary | ICD-10-CM

## 2023-08-15 DIAGNOSIS — I10 Essential (primary) hypertension: Secondary | ICD-10-CM

## 2023-08-15 LAB — COMPREHENSIVE METABOLIC PANEL
ALT: 11 U/L (ref 0–53)
AST: 11 U/L (ref 0–37)
Albumin: 3.8 g/dL (ref 3.5–5.2)
Alkaline Phosphatase: 126 U/L — ABNORMAL HIGH (ref 39–117)
BUN: 28 mg/dL — ABNORMAL HIGH (ref 6–23)
CO2: 33 meq/L — ABNORMAL HIGH (ref 19–32)
Calcium: 9.4 mg/dL (ref 8.4–10.5)
Chloride: 97 meq/L (ref 96–112)
Creatinine, Ser: 1.38 mg/dL (ref 0.40–1.50)
GFR: 45.33 mL/min — ABNORMAL LOW (ref >60.00)
Glucose, Bld: 170 mg/dL — ABNORMAL HIGH (ref 70–99)
Potassium: 4 meq/L (ref 3.5–5.1)
Sodium: 139 meq/L (ref 135–145)
Total Bilirubin: 0.7 mg/dL (ref 0.2–1.2)
Total Protein: 6.6 g/dL (ref 6.0–8.3)

## 2023-08-15 LAB — LIPID PANEL
Cholesterol: 123 mg/dL (ref 0–200)
HDL: 35.9 mg/dL — ABNORMAL LOW (ref >39.00)
LDL Cholesterol: 62 mg/dL (ref 0–99)
NonHDL: 86.86
Total CHOL/HDL Ratio: 3
Triglycerides: 126 mg/dL (ref 0.0–149.0)
VLDL: 25.2 mg/dL (ref 0.0–40.0)

## 2023-08-15 NOTE — Progress Notes (Signed)
 Established Patient Office Visit  Subjective   Patient ID: [name], male    DOB: 1931-11-29  Age: 89 y.o. MRN: [id number]  Chief Complaint  Patient presents with   Hypertension   Hyperlipidemia   Diabetes   Follow-up    HPI Discussed the use of AI scribe software for clinical note transcription with the patient, who gave verbal consent to proceed.  History of Present Illness   The patient is an 89 year old who presents for a routine follow-up visit.  He is feeling well overall and continues to participate in senior Olympics, maintaining his ability to walk and run without difficulty. His blood pressure is stable. No new concerns, pain, or significant changes in his health. No issues with his current medications and does not require any refills at this time.  He is currently taking Lantus, which was recently increased. His A1c has decreased from 12.6 to 11.5, indicating some improvement, but his blood sugar levels are still higher than desired.  He mentions a small tan lesion under his arm, which he describes as a cyst. It has been present for some time, is not growing, and does not cause any discomfort. He is not concerned about it at this time.  He experiences some neuropathy in one of his toes but has good sensation elsewhere and no wounds.  He has received the first dose of the shingles vaccine and is scheduled to receive the second dose today at [facility].      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 02/22/2023   Macular degeneration    Multiple myeloma not having achieved remission 08/13/2022   Lambda light chain disease 08/13/2022   Peripheral vascular disorder due to diabetes mellitus 08/13/2022   Vitamin D deficiency 01/28/2021   Stage 3b chronic kidney disease 02/06/2020   Diabetic polyneuropathy 02/06/2020   Hyperlipidemia associated with type 2 diabetes mellitus 04/06/2019   Monoclonal gammopathy  04/06/2019   Osteoporosis 03/21/2019   Primary open angle glaucoma (POAG) of left eye, mild stage 02/04/2019   Primary open angle glaucoma (POAG) of right eye, moderate stage 02/04/2019   Prostate cancer 02/15/2017   Fuchs' corneal dystrophy 08/18/2016   BPH with urinary obstruction 12/13/2012   CAD (coronary artery disease) 04/09/2011   Gastritis and gastroduodenitis 05/25/2010   Anemia, unspecified 10/27/2009   Type II diabetes mellitus 07/04/2006   Essential hypertension 07/04/2006   History of colonic polyps 1996   Past Medical History:  Diagnosis Date   Anemia, unspecified 10/27/2009   BPH with urinary obstruction 12/13/2012   CAD (coronary artery disease)    LHC 03/25/11 by Dr. Excell Seltzer:  pLAD 99%, oCFX 20-30%, pOM1 40%, dAVCFX 20-30%.  EF was normal on nuclear study.  He was treated with a Promus DES to his pLAD.    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/2023   Monoclonal gammopathy 04/06/2019   Multiple closed fractures of ribs of left side 03/21/2019   Multiple myeloma not having achieved remission 08/13/2022   Osteoporosis 03/21/2019   Peripheral vascular disorder due to diabetes mellitus 08/13/2022   Personal history of colonic polyps 1996   villous  adenoma   Primary open angle glaucoma (POAG) of left eye, mild stage 02/04/2019   Primary open angle glaucoma (POAG) of right eye, moderate stage 02/04/2019   Prostate cancer 02/15/2017   Stage 3b chronic kidney disease 02/06/2020   Type II diabetes mellitus 07/04/2006   Vitamin D deficiency 01/28/2021   Past Surgical History:  Procedure Laterality Date   CARDIAC CATHETERIZATION  03/25/2011    cataract Bilateral 2010   corenea implants  june and sept 2018   dr Molli [facility]   CORONARY STENT PLACEMENT  03/25/2011   RADIOACTIVE SEED IMPLANT N/A 05/06/2017   Procedure: RADIOACTIVE SEED IMPLANT/BRACHYTHERAPY IMPLANT;  Surgeon: Ihor Gully, MD;  Location: [facility];  Service: Urology;  Laterality: N/A;   SPACE OAR INSTILLATION N/A 05/06/2017   Procedure: SPACE OAR INSTILLATION;  Surgeon: Ihor Gully, MD;  Location: [facility];  Service: Urology;  Laterality: N/A;   Social History   Tobacco Use   Smoking status: Former    Current packs/day: 0.00    Average packs/day: 0.3 packs/day for 3.0 years (0.8 ttl pk-yrs)    Types: Cigarettes    Start date: 06/08/1955    Quit date: 06/07/1958    Years since quitting: 65.2   Smokeless tobacco: Never  Vaping Use   Vaping status: Never Used  Substance Use Topics   Alcohol use: Not Currently   Drug use: No   Social History   Socioeconomic History   Marital status: Widowed    Spouse name: Not on file   Number of children: Not on file   Years of education: 14   Highest education level: Associate degree: academic program  Occupational History   Occupation: Retired  Tobacco Use   Smoking status: Former    Current packs/day: 0.00    Average packs/day: 0.3 packs/day for 3.0 years (0.8 ttl pk-yrs)    Types: Cigarettes    Start date: 06/08/1955    Quit date: 06/07/1958    Years since quitting: 65.2   Smokeless tobacco: Never  Vaping Use   Vaping status: Never Used  Substance and Sexual Activity   Alcohol use: Not Currently   Drug use: No   Sexual activity: Not Currently  Other Topics Concern   Not on file  Social History Narrative   Right   Lives with son    Retired   Two story home   Caffeine  2 cup a day   Still drives   Social Drivers of Health   Financial Resource Strain: Low Risk  (08/11/2023)   Overall Financial Resource Strain (CARDIA)    Difficulty of Paying Living Expenses: Not hard  at all  Food Insecurity: No Food Insecurity (08/11/2023)   Hunger Vital Sign    Worried About Running Out of Food in the Last Year: Never true    Ran Out of Food in the Last Year: Never true  Transportation Needs: No Transportation Needs (08/11/2023)   PRAPARE - Administrator, Civil Service (Medical): No    Lack of Transportation (Non-Medical): No  Physical Activity: Insufficiently Active (08/11/2023)   Exercise Vital Sign    Days of Exercise per Week: 5 days    Minutes of Exercise per Session: 20 min  Stress: No Stress Concern Present (08/11/2023)   [employer] of Occupational Health - Occupational Stress Questionnaire    Feeling of Stress : Not at all  Social Connections: Moderately Integrated (08/11/2023)   Social Connection and Isolation Panel [NHANES]  Frequency of Communication with Friends and Family: More than three times a week    Frequency of Social Gatherings with Friends and Family: Three times a week    Attends Religious Services: More than 4 times per year    Active Member of Clubs or Organizations: Yes    Attends Banker Meetings: More than 4 times per year    Marital Status: Widowed  Intimate Partner Violence: Not At Risk (09/17/2022)   Humiliation, Afraid, Rape, and Kick questionnaire    Fear of Current or Ex-Partner: No    Emotionally Abused: No    Physically Abused: No    Sexually Abused: No   Family Status  Relation Name Status   Mother  Deceased   Father  Deceased   Sister  Alive   Sister  Alive   Brother  Deceased at age 67   Son  Alive   Son  Alive   Son  Alive  No partnership data on file   Family History  Problem Relation Age of Onset   Stomach cancer Mother 52   Coronary artery disease Father 45       deceased   Healthy Son    Healthy Son    Healthy Son    No Known Allergies    Review of Systems  Constitutional:  Negative for chills, fever and malaise/fatigue.  HENT:  Negative for congestion and hearing loss.    Eyes:  Negative for blurred vision and discharge.  Respiratory:  Negative for cough, sputum production and shortness of breath.   Cardiovascular:  Negative for chest pain, palpitations and leg swelling.  Gastrointestinal:  Negative for abdominal pain, blood in stool, constipation, diarrhea, heartburn, nausea and vomiting.  Genitourinary:  Negative for dysuria, frequency, hematuria and urgency.  Musculoskeletal:  Negative for back pain, falls and myalgias.  Skin:  Negative for rash.  Neurological:  Negative for dizziness, sensory change, loss of consciousness, weakness and headaches.  Endo/Heme/Allergies:  Negative for environmental allergies. Does not bruise/bleed easily.  Psychiatric/Behavioral:  Negative for depression and suicidal ideas. The patient is not nervous/anxious and does not have insomnia.       Objective:     BP 132/62 (BP Location: Left Arm, Patient Position: Sitting, Cuff Size: Normal)   Pulse (!) 54   Temp 98.1 F (36.7 C) (Oral)   Resp 18   Ht 5\' 4"  (1.626 m)   Wt 126 lb (57.2 kg)   SpO2 95%   BMI 21.63 kg/m  BP Readings from Last 3 Encounters:  08/15/23 132/62  08/12/23 122/80  06/06/23 (!) 136/58   Wt Readings from Last 3 Encounters:  08/15/23 126 lb (57.2 kg)  08/12/23 125 lb (56.7 kg)  06/06/23 127 lb (57.6 kg)   SpO2 Readings from Last 3 Encounters:  08/15/23 95%  08/12/23 93%  06/06/23 98%      Physical Exam Vitals and nursing note reviewed.  Constitutional:      General: He is not in acute distress.    Appearance: Normal appearance. He is well-developed.  HENT:     Head: Normocephalic and atraumatic.  Eyes:     General: No scleral icterus.       Right eye: No discharge.        Left eye: No discharge.  Cardiovascular:     Rate and Rhythm: Normal rate and regular rhythm.     Heart sounds: No murmur heard. Pulmonary:     Effort: Pulmonary effort is normal. No respiratory distress.  Breath sounds: Normal breath sounds.   Musculoskeletal:        General: Normal range of motion.     Cervical back: Normal range of motion and neck supple.     Right lower leg: No edema.     Left lower leg: No edema.  Skin:    General: Skin is warm and dry.  Neurological:     Mental Status: He is alert and oriented to person, place, and time.  Psychiatric:        Mood and Affect: Mood normal.        Behavior: Behavior normal.        Thought Content: Thought content normal.        Judgment: Judgment normal.      No results found for any visits on 08/15/23.  Last CBC Lab Results  Component Value Date   WBC 10.5 05/02/2023   HGB 13.8 05/02/2023   HCT 40.8 05/02/2023   MCV 88.1 05/02/2023   MCH 29.8 05/02/2023   RDW 13.4 05/02/2023   PLT 223 05/02/2023   Last metabolic panel Lab Results  Component Value Date   GLUCOSE 280 (H) 05/02/2023   NA 141 05/02/2023   K 3.9 05/02/2023   CL 102 05/02/2023   CO2 33 (H) 05/02/2023   BUN 23 05/02/2023   CREATININE 1.63 (H) 05/02/2023   GFRNONAA 40 (L) 05/02/2023   CALCIUM 9.7 05/02/2023   PROT 7.0 05/02/2023   ALBUMIN 4.0 05/02/2023   LABGLOB 2.7 05/02/2023   BILITOT 0.7 05/02/2023   ALKPHOS 115 05/02/2023   AST 14 (L) 05/02/2023   ALT 21 05/02/2023   ANIONGAP 6 05/02/2023   Last lipids Lab Results  Component Value Date   CHOL 134 02/14/2023   HDL 38.90 (L) 02/14/2023   LDLCALC 66 02/14/2023   TRIG 146.0 02/14/2023   CHOLHDL 3 02/14/2023   Last hemoglobin A1c Lab Results  Component Value Date   HGBA1C 11.5 (A) 08/12/2023   Last thyroid functions Lab Results  Component Value Date   TSH 3.58 08/13/2022   Last vitamin D Lab Results  Component Value Date   VD25OH 37.61 02/14/2023   Last vitamin B12 and Folate Lab Results  Component Value Date   VITAMINB12 359 08/13/2022   FOLATE >20.0 ng/mL 03/04/2010      The ASCVD Risk score (Arnett DK, et al., 2019) failed to calculate for the following reasons:   The 2019 ASCVD risk score is only valid  for ages 19 to 87    Assessment & Plan:   Problem List Items Addressed This Visit       Unprioritized   Type II diabetes mellitus   Hyperlipidemia associated with type 2 diabetes mellitus   Relevant Orders   Comprehensive metabolic panel   Lipid panel   Other Visit Diagnoses       Primary hypertension    -  Primary   Relevant Orders   Comprehensive metabolic panel   Lipid panel     Assessment and Plan    Type 2 Diabetes Mellitus   A1c has decreased from 12.6% to 11.5% after increasing Lantus dosage, showing improvement but still above target. He understands the need to further reduce A1c. Continue current diabetes management plan and monitor A1c levels.  Peripheral Neuropathy   Mild peripheral neuropathy is present in one toe, likely due to diabetes, with good sensation elsewhere and no wounds.  Benign Cyst   A cyst under his arm remains stable without discomfort, color  changes, or growth. Further action may be needed if it grows or causes discomfort, possibly involving a dermatologist.  General Health Maintenance   He actively participates in senior athletics. He received the first dose of the shingles vaccine and is scheduled for the second dose today. Administer the second dose of the shingles vaccine and order cholesterol blood work.  Follow-up   He is under regular care with a urologist and an ophthalmologist and manages medications well. Follow up with the urologist and ophthalmologist as scheduled and request medication refills online as needed.        Return in about 6 months (around 02/15/2024), or if symptoms worsen or fail to improve, for annual exam, fasting.    Donato Schultz, DO

## 2023-08-15 NOTE — Patient Instructions (Signed)
 Cholesterol Content in Foods Cholesterol is a waxy, fat-like substance that helps to carry fat in the blood. The body needs cholesterol in small amounts, but too much cholesterol can cause damage to the arteries and heart. What foods have cholesterol?  Cholesterol is found in animal-based foods, such as meat, seafood, and dairy. Generally, low-fat dairy and lean meats have less cholesterol than full-fat dairy and fatty meats. The milligrams of cholesterol per serving (mg per serving) of common cholesterol-containing foods are listed below. Meats and other proteins Egg -- one large whole egg has 186 mg. Veal shank -- 4 oz (113 g) has 141 mg. Lean ground [place] (93% lean) -- 4 oz (113 g) has 118 mg. Fat-trimmed lamb loin -- 4 oz (113 g) has 106 mg. Lean ground beef (90% lean) -- 4 oz (113 g) has 100 mg. Lobster -- 3.5 oz (99 g) has 90 mg. Pork loin chops -- 4 oz (113 g) has 86 mg. Canned salmon -- 3.5 oz (99 g) has 83 mg. Fat-trimmed beef top loin -- 4 oz (113 g) has 78 mg. Frankfurter -- 1 frank (3.5 oz or 99 g) has 77 mg. Crab -- 3.5 oz (99 g) has 71 mg. Roasted chicken without skin, white meat -- 4 oz (113 g) has 66 mg. Light bologna -- 2 oz (57 g) has 45 mg. Deli-cut [place] -- 2 oz (57 g) has 31 mg. Canned tuna -- 3.5 oz (99 g) has 31 mg. Tomasa Blase -- 1 oz (28 g) has 29 mg. Oysters and mussels (raw) -- 3.5 oz (99 g) has 25 mg. Mackerel -- 1 oz (28 g) has 22 mg. Trout -- 1 oz (28 g) has 20 mg. Pork sausage -- 1 link (1 oz or 28 g) has 17 mg. Salmon -- 1 oz (28 g) has 16 mg. Tilapia -- 1 oz (28 g) has 14 mg. Dairy Soft-serve ice cream --  cup (4 oz or 86 g) has 103 mg. Whole-milk yogurt -- 1 cup (8 oz or 245 g) has 29 mg. Cheddar cheese -- 1 oz (28 g) has 28 mg. American cheese -- 1 oz (28 g) has 28 mg. Whole milk -- 1 cup (8 oz or 250 mL) has 23 mg. 2% milk -- 1 cup (8 oz or 250 mL) has 18 mg. Cream cheese -- 1 tablespoon (Tbsp) (14.5 g) has 15 mg. Cottage cheese --  cup (4 oz or  113 g) has 14 mg. Low-fat (1%) milk -- 1 cup (8 oz or 250 mL) has 10 mg. Sour cream -- 1 Tbsp (12 g) has 8.5 mg. Low-fat yogurt -- 1 cup (8 oz or 245 g) has 8 mg. Nonfat Greek yogurt -- 1 cup (8 oz or 228 g) has 7 mg. Half-and-half cream -- 1 Tbsp (15 mL) has 5 mg. Fats and oils Cod liver oil -- 1 tablespoon (Tbsp) (13.6 g) has 82 mg. Butter -- 1 Tbsp (14 g) has 15 mg. Lard -- 1 Tbsp (12.8 g) has 14 mg. Bacon grease -- 1 Tbsp (12.9 g) has 14 mg. Mayonnaise -- 1 Tbsp (13.8 g) has 5-10 mg. Margarine -- 1 Tbsp (14 g) has 3-10 mg. The items listed above may not be a complete list of foods with cholesterol. Exact amounts of cholesterol in these foods may vary depending on specific ingredients and brands. Contact a dietitian for more information. What foods do not have cholesterol? Most plant-based foods do not have cholesterol unless you combine them with a food that has  cholesterol. Foods without cholesterol include: Grains and cereals. Vegetables. Fruits. Vegetable oils, such as olive, canola, and sunflower oil. Legumes, such as peas, beans, and lentils. Nuts and seeds. Egg whites. The items listed above may not be a complete list of foods that do not have cholesterol. Contact a dietitian for more information. Summary The body needs cholesterol in small amounts, but too much cholesterol can cause damage to the arteries and heart. Cholesterol is found in animal-based foods, such as meat, seafood, and dairy. Generally, low-fat dairy and lean meats have less cholesterol than full-fat dairy and fatty meats. This information is not intended to replace advice given to you by your health care provider. Make sure you discuss any questions you have with your health care provider. Document Revised: 10/03/2020 Document Reviewed: 10/03/2020 Elsevier Patient Education  2024 [employer].

## 2023-08-17 ENCOUNTER — Other Ambulatory Visit: Payer: Self-pay | Admitting: Family Medicine

## 2023-08-17 DIAGNOSIS — I10 Essential (primary) hypertension: Secondary | ICD-10-CM

## 2023-08-17 DIAGNOSIS — E1169 Type 2 diabetes mellitus with other specified complication: Secondary | ICD-10-CM

## 2023-08-24 ENCOUNTER — Encounter: Payer: Self-pay | Admitting: Family Medicine

## 2023-08-26 ENCOUNTER — Telehealth: Payer: Self-pay | Admitting: Family Medicine

## 2023-08-26 NOTE — Telephone Encounter (Signed)
 Copied from CRM [id number]. Topic: Medicare AWV >> Aug 26, 2023 11:41 AM Payton Doughty wrote: Reason for CRM: Called LVM 08/26/2023 to schedule AWV. Please schedule Virtual or Telehealth visits ONLY.   Verlee Rossetti; Care Guide Ambulatory Clinical Support [facility] Direct Dial: [phone]

## 2023-08-28 ENCOUNTER — Other Ambulatory Visit: Payer: Self-pay | Admitting: Family Medicine

## 2023-08-28 ENCOUNTER — Other Ambulatory Visit: Payer: Self-pay | Admitting: Internal Medicine

## 2023-08-28 DIAGNOSIS — I10 Essential (primary) hypertension: Secondary | ICD-10-CM

## 2023-09-13 ENCOUNTER — Institutional Professional Consult (permissible substitution): Payer: Medicare HMO | Admitting: Psychology

## 2023-09-13 ENCOUNTER — Ambulatory Visit: Payer: Self-pay

## 2023-09-15 ENCOUNTER — Other Ambulatory Visit: Payer: Self-pay | Admitting: *Deleted

## 2023-09-15 DIAGNOSIS — I6523 Occlusion and stenosis of bilateral carotid arteries: Secondary | ICD-10-CM

## 2023-09-19 DIAGNOSIS — H401112 Primary open-angle glaucoma, right eye, moderate stage: Secondary | ICD-10-CM | POA: Diagnosis not present

## 2023-09-19 DIAGNOSIS — H401121 Primary open-angle glaucoma, left eye, mild stage: Secondary | ICD-10-CM | POA: Diagnosis not present

## 2023-09-20 ENCOUNTER — Encounter: Payer: Medicare HMO | Admitting: Psychology

## 2023-09-26 ENCOUNTER — Encounter: Payer: Self-pay | Admitting: Surgery

## 2023-09-26 ENCOUNTER — Ambulatory Visit (HOSPITAL_COMMUNITY)
Admission: RE | Admit: 2023-09-26 | Discharge: 2023-09-26 | Disposition: A | Discharge status: Home / Self Care | Payer: Medicare HMO | Source: Ambulatory Visit | Attending: Surgery | Admitting: Surgery

## 2023-09-26 ENCOUNTER — Ambulatory Visit: Payer: Medicare HMO | Admitting: Surgery

## 2023-09-26 VITALS — BP 152/75 | HR 71 | Temp 98.0°F | Resp 18 | Ht 64.0 in | Wt 129.0 lb

## 2023-09-26 DIAGNOSIS — I6523 Occlusion and stenosis of bilateral carotid arteries: Secondary | ICD-10-CM

## 2023-09-26 NOTE — Progress Notes (Signed)
 [facility] [place]  Patient name: [name] MRN: [id number] DOB: 1931-10-18 Sex: male   REASON FOR VISIT:    Follow up  HISOTRY OF PRESENT ILLNESS:    [name] is a 89 y.o. male who I initially saw and 2023 for carotid stenosis.  This was initially detected by auscultation of a bruit.  He was asymptomatic.  Ultrasound revealed mild 1-49% bilateral stenosis.  He has had no issues since I last saw him.  He has competed in the 50 and 100 yard-in the senior games.  He remains very active  Patient has a history of coronary artery disease, status post PCI.  He suffers from type 2 diabetes.  He is medically managed for hypertension.  He takes a statin for hypercholesterolemia.   The patient is a active runner and competes in competition, having just 1 several gold metals.  He eats very healthy.  He is a non-smoker. PAST MEDICAL HISTORY:   Past Medical History:  Diagnosis Date   Anemia, unspecified 10/27/2009   BPH with urinary obstruction 12/13/2012   CAD (coronary artery disease)    LHC 03/25/11 by Dr. Arlester Ladd:  pLAD 99%, oCFX 20-30%, pOM1 40%, dAVCFX 20-30%.  EF was normal on nuclear study.  He was treated with a Promus DES to his pLAD.    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/2023   Monoclonal gammopathy 04/06/2019   Multiple closed fractures of ribs of left side 03/21/2019   Multiple myeloma not having achieved remission 08/13/2022   Osteoporosis 03/21/2019   Peripheral vascular disorder due to diabetes mellitus 08/13/2022   Personal history of colonic polyps 1996   villous adenoma   Primary  open angle glaucoma (POAG) of left eye, mild stage 02/04/2019   Primary open angle glaucoma (POAG) of right eye, moderate stage 02/04/2019   Prostate cancer 02/15/2017   Stage 3b chronic kidney disease 02/06/2020   Type II diabetes mellitus 07/04/2006   Vitamin D  deficiency 01/28/2021     FAMILY HISTORY:   Family History  Problem Relation Age of Onset   Stomach cancer Mother 71   Coronary artery disease Father 52       deceased   Healthy Son    Healthy Son    Healthy Son     SOCIAL HISTORY:   Social History   Tobacco Use   Smoking status: Former    Current packs/day: 0.00    Average packs/day: 0.3 packs/day for 3.0 years (0.8 ttl pk-yrs)    Types: Cigarettes    Start date: 06/08/1955    Quit date: 06/07/1958    Years since quitting: 65.3   Smokeless tobacco: Never  Substance Use Topics   Alcohol  use: Not Currently     ALLERGIES:   No Known Allergies   CURRENT MEDICATIONS:   Current Outpatient Medications  Medication Sig Dispense Refill   ABRYSVO 120 MCG/0.5ML injection      acetaminophen  (TYLENOL ) 500 MG tablet Take 2 tablets (1,000 mg total) by mouth every 6 (six) hours as needed. 60 tablet 0   Alcohol  Swabs  PADS Use as directed once a day 100 each 1   alendronate  (FOSAMAX ) 70 MG tablet TAKE 1  TABLET ONCE WEEKLY *TAKE WITH A FULL GLASS OF WATER ON AN EMPTY STOMACH* 12 tablet 0   amLODipine  (NORVASC ) 5 MG tablet TAKE 1.5 TABLETS BY MOUTH DAILY. 135 tablet 1   aspirin 81 MG tablet Take 81 mg by mouth daily.     atorvastatin  (LIPITOR) 20 MG tablet TAKE 1 TABLET EVERY DAY 90 tablet 3   Blood Glucose Calibration (TRUE METRIX LEVEL 1) Low SOLN USE AS DIRECTED 1 each 3   Blood Glucose Calibration (TRUE METRIX LEVEL 3) High SOLN Use to check controls on glucometer strips every 30 days or with each new bottle of strips (whichever comes first). 3 each 1   Blood Glucose Monitoring Suppl (TRUE METRIX METER) w/Device KIT USE AS DIRECTED ONCE A DAY 1 kit 0   BOOSTRIX  5-2.5-18.5 LF-MCG/0.5 injection      brimonidine (ALPHAGAN) 0.2 % ophthalmic solution SMARTSIG:In Eye(s)     brimonidine-timolol (COMBIGAN) 0.2-0.5 % ophthalmic solution Place 1 drop into both eyes 2 (two) times daily.     COLLAGEN PO Take by mouth. 1 scoop daily     dorzolamide-timolol (COSOPT) 2-0.5 % ophthalmic solution SMARTSIG:In Eye(s)     empagliflozin  (JARDIANCE ) 25 MG TABS tablet Take 1 tablet (25 mg total) by mouth daily before breakfast. 90 tablet 1   fish oil-omega-3 fatty acids 1000 MG capsule Take 2 g by mouth daily.     FLUZONE HIGH-DOSE 0.5 ML injection      glucose blood (TRUE METRIX BLOOD GLUCOSE TEST) test strip TEST BLOOD SUGAR ONE TIME DAILY 100 strip 3   insulin  glargine (LANTUS  SOLOSTAR) 100 UNIT/ML Solostar Pen Inject 26 Units into the skin daily. 30 mL 4   Insulin  Pen Needle 32G X 4 MM MISC 1 Device by Does not apply route daily in the afternoon. 100 each 3   latanoprost (XALATAN) 0.005 % ophthalmic solution SMARTSIG:In Eye(s)     losartan -hydrochlorothiazide (HYZAAR) 100-25 MG tablet TAKE 1 TABLET EVERY DAY 90 tablet 3   memantine  (NAMENDA ) 5 MG tablet Take 1 tablet (5 mg total) by mouth 2 (two) times daily. 180 tablet 3   metoprolol  succinate (TOPROL -XL) 50 MG 24 hr tablet TAKE 1 TABLET EVERY DAY WITH OR IMMEDIATELY FOLLOWING A MEAL 90 tablet 3   Multiple Vitamin (MULTIVITAMIN) tablet Take 1 tablet by mouth daily.     SHINGRIX injection      SPIKEVAX syringe      tamsulosin  (FLOMAX ) 0.4 MG CAPS capsule TAKE 1 CAPSULE EVERY DAY AFTER SUPPER 90 capsule 3   TRUEplus Lancets 33G MISC Use to check blood sugar once a day.  DX  E11.9 100 each 1   vitamin B-12 (CYANOCOBALAMIN ) 100 MCG tablet Take 100 mcg by mouth daily.     No current facility-administered medications for this visit.    REVIEW OF SYSTEMS:   [X]  denotes positive finding, [ ]  denotes negative finding Cardiac  Comments:  Chest pain or chest pressure:    Shortness of breath upon exertion:    Short of  breath when lying flat:    Irregular heart rhythm:        Vascular    Pain in calf, thigh, or hip brought on by ambulation:    Pain in feet at night that wakes you up from your sleep:     Blood clot in your veins:    Leg swelling:         Pulmonary    Oxygen at home:    Productive cough:  Wheezing:         Neurologic    Sudden weakness in arms or legs:     Sudden numbness in arms or legs:     Sudden onset of difficulty speaking or slurred speech:    Temporary loss of vision in one eye:     Problems with dizziness:         Gastrointestinal    Blood in stool:     Vomited blood:         Genitourinary    Burning when urinating:     Blood in urine:        Psychiatric    Major depression:         Hematologic    Bleeding problems:    Problems with blood clotting too easily:        Skin    Rashes or ulcers:        Constitutional    Fever or chills:      PHYSICAL EXAM:   Vitals:   09/26/23 1113  BP: (!) 152/75  Pulse: 71  Resp: 18  Temp: 98 F (36.7 C)  TempSrc: Temporal  SpO2: 95%  Weight: 129 lb (58.5 kg)  Height: 5\' 4"  (1.626 m)    GENERAL: The patient is a well-nourished male, in no acute distress. The vital signs are documented above. CARDIAC: There is a regular rate and rhythm.  PULMONARY: Non-labored respirations MUSCULOSKELETAL: There are no major deformities or cyanosis. NEUROLOGIC: No focal weakness or paresthesias are detected. SKIN: There are no ulcers or rashes noted. PSYCHIATRIC: The patient has a normal affect.  STUDIES:   I have reviewed the following:  Right Carotid: Velocities in the right ICA are consistent with a 1-39%  stenosis.   Left Carotid: Velocities in the left ICA are consistent with a 1-39%  stenosis.   Vertebrals: Bilateral vertebral arteries demonstrate antegrade flow.  Subclavians: Bilateral subclavian artery flow was disturbed.  MEDICAL ISSUES:   Carotid: He remains asymptomatic with minimal stenosis.  We  discussed surveillance imaging in 2 years    Gareld June, IV, MD, FACS [facility] [phone] Pager [phone]

## 2023-09-30 ENCOUNTER — Encounter: Payer: Self-pay | Admitting: Physician Assistant

## 2023-10-31 ENCOUNTER — Other Ambulatory Visit: Payer: Self-pay | Admitting: Hematology and Oncology

## 2023-10-31 DIAGNOSIS — D472 Monoclonal gammopathy: Secondary | ICD-10-CM

## 2023-10-31 NOTE — Progress Notes (Unsigned)
 [facility] [phone]   [phone]  PROGRESS NOTE  Patient Care Team: Crecencio Dodge, Candida Chalk, DO as PCP - General (Family Medicine) Ottelin, Mark, MD (Inactive) as Consulting Physician (Urology) Kenith Payer, MD as Consulting Physician (Radiation Oncology) Ladon Pickler, MD as Referring Physician (Ophthalmology) Ander Bame, MD as Consulting Physician (Hematology and Oncology) [facility], Julian Obey, MD as Consulting Physician (Endocrinology) Roxane Copp, MD as Consulting Physician (Urology) Leandra Pro, MD as Consulting Physician (Nephrology) Cecilie Coffee, RPH-CPP (Pharmacist) [name], [name], PA-C (Neurology)  Hematological/Oncological History #Smoldering Multiple Myeloma, Intermediate Risk 1) 02/15/19: found to have ambda chains 645.5, kappa 27.9 with a ratio of 0.04, no serum M protein during nephrology work up.  2) 04/06/19: establish care with Dr. Rosaline Coma 3) 04/06/19: SPEP shows no serum monoclonal protein, however high levels of M protein detected in the urine. B2Microglobulin at 3.3. Bone Survey negative.  4)04/23/19: Bone Marrow biopsy performed, confirmed 10-15% clonal plasma cells (9% in aspirate). Confirmed diagnosis of Smoldering multiple myeloma, intermediate risk. 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 Gleason Score of 3+4 1) 05/06/17: Insertion of radioactive I-125 seeds into the prostate gland with placement of SpaceOAR; 145 Gy, definitive therapy. Radiation oncologist was Dr. Lorri Rota at [facility] 2) Subsequently followed by outside urology with serial PSA measurements. Reported last measurement in Oct 2020 at PSA 0.2.   Interval History:  [name] 89 y.o. male with medical history significant for smoldering multiple myeloma and prostate cancer presents for a follow up visit. He was last seen on 11/03/2022 for a routine f/u. In the interim since his last visit [name] has had no  changes in medications, no hospitalizations or emergency room visits.  On exam today [name] is accompanied by his son.  He reports that he has been doing well overall in the interim since our last visit.  He reports he will be having Thanksgiving in [place] with his other son Aysen.  He reports that he is feeling quite good and running at the gym on a regular basis.  He recently placed first in 100 to 200 m-.  He notes that he has been maintaining his weight at home 25 pounds and is very steady.  He has been eating well with a lot of chicken and seafood.  He reports he drinks coffee twice a day from [place].  He is not having any bone or back pain.  He is not having any pain anywhere at the moment.  He denies any runny nose, sore throat, or cough.  He has received his flu vaccine and is up-to-date on his other vaccines through his PCP.  He denies easy bruising or signs of bleeding. He denies fevers, chills, night sweats, shortness of breath, chest pain or cough. He has no other complaints. Rest of the 10 point ROS is below.   MEDICAL HISTORY:  Past Medical History:  Diagnosis Date   Anemia, unspecified 10/27/2009   BPH with urinary obstruction 12/13/2012   CAD (coronary artery disease)    LHC 03/25/11 by Dr. Arlester Ladd:  pLAD 99%, oCFX 20-30%, pOM1 40%, dAVCFX 20-30%.  EF was normal on nuclear study.  He was treated with a Promus DES to his pLAD.    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 02/22/2023   Monoclonal gammopathy 04/06/2019   Multiple closed fractures of ribs of left side 03/21/2019   Multiple myeloma not having achieved remission 08/13/2022   Osteoporosis 03/21/2019    Peripheral vascular disorder due to diabetes mellitus 08/13/2022   Personal history of colonic polyps 1996   villous adenoma   Primary open angle glaucoma (POAG) of left eye, mild stage 02/04/2019   Primary open angle glaucoma (POAG) of right eye, moderate stage 02/04/2019   Prostate cancer 02/15/2017   Stage 3b chronic kidney disease 02/06/2020   Type II diabetes mellitus 07/04/2006   Vitamin D  deficiency 01/28/2021    SURGICAL HISTORY: Past Surgical History:  Procedure Laterality Date   CARDIAC CATHETERIZATION  03/25/2011   cataract Bilateral 2010   corenea implants  june and sept 2018   dr Zoila Hines [facility]   CORONARY STENT PLACEMENT  03/25/2011   RADIOACTIVE SEED IMPLANT N/A 05/06/2017   Procedure: RADIOACTIVE SEED IMPLANT/BRACHYTHERAPY IMPLANT;  Surgeon: Ottelin, Mark, MD;  Location[facility];  Service: Urology;  Laterality: N/A;   SPACE OAR INSTILLATION N/A 05/06/2017   Procedure: SPACE OAR INSTILLATION;  Surgeon: Ottelin, Mark, MD;  Location: [facility];  Service: Urology;  Laterality: N/A;    SOCIAL HISTORY: Social History   Socioeconomic History   Marital status: Widowed    Spouse name: Not on file   Number of children: Not on file   Years of education: 14   Highest education level: Associate degree: academic program  Occupational History   Occupation: Retired  Tobacco Use   Smoking status: Former    Current packs/day: 0.00    Average packs/day: 0.3 packs/day for 3.0 years (0.8 ttl pk-yrs)    Types: Cigarettes    Start date: 06/08/1955    Quit date: 06/07/1958    Years since quitting: 65.4   Smokeless tobacco: Never  Vaping Use   Vaping status: Never Used  Substance and Sexual Activity   Alcohol  use: Not Currently   Drug use: No   Sexual activity: Not Currently  Other Topics Concern   Not on file  Social History Narrative   Right   Lives with son    Retired   Two story home   Caffeine  2 cup a day   Still drives    Social Drivers of Health   Financial Resource Strain: Low Risk  (08/11/2023)   Overall Financial Resource Strain (CARDIA)    Difficulty of Paying Living Expenses: Not hard at all  Food Insecurity: No Food Insecurity (08/11/2023)   Hunger Vital Sign    Worried About Running Out of Food in the Last Year: Never true    Ran Out of Food in the Last Year: Never true  Transportation Needs: No Transportation Needs (08/11/2023)   PRAPARE - Administrator, Civil Service (Medical): No    Lack of Transportation (Non-Medical): No  Physical Activity: Insufficiently Active (08/11/2023)   Exercise Vital Sign    Days of Exercise per Week: 5 days    Minutes of Exercise per Session: 20 min  Stress: No Stress Concern Present (08/11/2023)   [employer] of Occupational Health - Occupational Stress Questionnaire    Feeling of Stress : Not at all  Social Connections: Moderately Integrated (08/11/2023)   Social Connection and Isolation Panel [NHANES]    Frequency of Communication with Friends and Family: More than three times  a week    Frequency of Social Gatherings with Friends and Family: Three times a week    Attends Religious Services: More than 4 times per year    Active Member of Clubs or Organizations: Yes    Attends Banker Meetings: More than 4 times per year    Marital Status: Widowed  Intimate Partner Violence: Not At Risk (09/17/2022)   Humiliation, Afraid, Rape, and Kick questionnaire    Fear of Current or Ex-Partner: No    Emotionally Abused: No    Physically Abused: No    Sexually Abused: No    FAMILY HISTORY: Family History  Problem Relation Age of Onset   Stomach cancer Mother 58   Coronary artery disease Father 69       deceased   Healthy Son    Healthy Son    Healthy Son     ALLERGIES:  has no known allergies.  MEDICATIONS:  Current Outpatient Medications  Medication Sig Dispense Refill   ABRYSVO 120 MCG/0.5ML injection      acetaminophen  (TYLENOL )  500 MG tablet Take 2 tablets (1,000 mg total) by mouth every 6 (six) hours as needed. 60 tablet 0   Alcohol  Swabs  PADS Use as directed once a day 100 each 1   alendronate  (FOSAMAX ) 70 MG tablet TAKE 1 TABLET ONCE WEEKLY *TAKE WITH A FULL GLASS OF WATER ON AN EMPTY STOMACH* 12 tablet 0   amLODipine  (NORVASC ) 5 MG tablet TAKE 1.5 TABLETS BY MOUTH DAILY. 135 tablet 1   aspirin 81 MG tablet Take 81 mg by mouth daily.     atorvastatin  (LIPITOR) 20 MG tablet TAKE 1 TABLET EVERY DAY 90 tablet 3   Blood Glucose Calibration (TRUE METRIX LEVEL 1) Low SOLN USE AS DIRECTED 1 each 3   Blood Glucose Calibration (TRUE METRIX LEVEL 3) High SOLN Use to check controls on glucometer strips every 30 days or with each new bottle of strips (whichever comes first). 3 each 1   Blood Glucose Monitoring Suppl (TRUE METRIX METER) w/Device KIT USE AS DIRECTED ONCE A DAY 1 kit 0   BOOSTRIX 5-2.5-18.5 LF-MCG/0.5 injection      brimonidine (ALPHAGAN) 0.2 % ophthalmic solution SMARTSIG:In Eye(s)     brimonidine-timolol (COMBIGAN) 0.2-0.5 % ophthalmic solution Place 1 drop into both eyes 2 (two) times daily.     COLLAGEN PO Take by mouth. 1 scoop daily     dorzolamide-timolol (COSOPT) 2-0.5 % ophthalmic solution SMARTSIG:In Eye(s)     empagliflozin  (JARDIANCE ) 25 MG TABS tablet Take 1 tablet (25 mg total) by mouth daily before breakfast. 90 tablet 1   fish oil-omega-3 fatty acids 1000 MG capsule Take 2 g by mouth daily.     FLUZONE HIGH-DOSE 0.5 ML injection      glucose blood (TRUE METRIX BLOOD GLUCOSE TEST) test strip TEST BLOOD SUGAR ONE TIME DAILY 100 strip 3   insulin  glargine (LANTUS  SOLOSTAR) 100 UNIT/ML Solostar Pen Inject 26 Units into the skin daily. 30 mL 4   Insulin  Pen Needle 32G X 4 MM MISC 1 Device by Does not apply route daily in the afternoon. 100 each 3   latanoprost (XALATAN) 0.005 % ophthalmic solution SMARTSIG:In Eye(s)     losartan -hydrochlorothiazide (HYZAAR) 100-25 MG tablet TAKE 1 TABLET EVERY DAY 90  tablet 3   memantine  (NAMENDA ) 5 MG tablet Take 1 tablet (5 mg total) by mouth 2 (two) times daily. 180 tablet 3   metoprolol  succinate (TOPROL -XL) 50 MG 24 hr tablet TAKE 1  TABLET EVERY DAY WITH OR IMMEDIATELY FOLLOWING A MEAL 90 tablet 3   Multiple Vitamin (MULTIVITAMIN) tablet Take 1 tablet by mouth daily.     SHINGRIX injection      SPIKEVAX syringe      tamsulosin  (FLOMAX ) 0.4 MG CAPS capsule TAKE 1 CAPSULE EVERY DAY AFTER SUPPER 90 capsule 3   TRUEplus Lancets 33G MISC Use to check blood sugar once a day.  DX  E11.9 100 each 1   vitamin B-12 (CYANOCOBALAMIN ) 100 MCG tablet Take 100 mcg by mouth daily.     No current facility-administered medications for this visit.    REVIEW OF SYSTEMS:   Constitutional: ( - ) fevers, ( - )  chills , ( - ) night sweats Eyes: ( - ) blurriness of vision, ( - ) double vision, ( - ) watery eyes Ears, nose, mouth, throat, and face: ( - ) mucositis, ( - ) sore throat Respiratory: ( - ) cough, ( - ) dyspnea, ( - ) wheezes Cardiovascular: ( - ) palpitation, ( - ) chest discomfort, ( - ) lower extremity swelling Gastrointestinal:  ( - ) nausea, ( - ) heartburn, ( - ) change in bowel habits Skin: ( - ) abnormal skin rashes Lymphatics: ( - ) new lymphadenopathy, ( - ) easy bruising Neurological: ( - ) numbness, ( - ) tingling, ( - ) new weaknesses Behavioral/Psych: ( - ) mood change, ( - ) new changes  All other systems were reviewed with the patient and are negative.  PHYSICAL EXAMINATION: ECOG PERFORMANCE STATUS: 0 - Asymptomatic  There were no vitals filed for this visit.   There were no vitals filed for this visit.    GENERAL: well appearing elderly Caucasian male, alert, no distress and comfortable SKIN: skin color, texture, turgor are normal, no rashes or significant lesions EYES: conjunctiva are pink and non-injected, sclera clear LUNGS: clear to auscultation and percussion with normal breathing effort HEART: regular rate & rhythm and no  murmurs and no lower extremity edema Musculoskeletal: no cyanosis of digits and no clubbing  PSYCH: alert & oriented x 3, fluent speech NEURO: no focal motor/sensory deficits  LABORATORY DATA:  I have reviewed the data as listed     Latest Ref Rng & Units 08/15/2023    9:43 AM 05/02/2023    8:14 AM 02/14/2023   10:02 AM  CMP  Glucose 70 - 99 mg/dL 308  657  97   BUN 6 - 23 mg/dL 28  23  26    Creatinine 0.40 - 1.50 mg/dL 8.46  9.62  9.52   Sodium 135 - 145 mEq/L 139  141  144   Potassium 3.5 - 5.1 mEq/L 4.0  3.9  3.7   Chloride 96 - 112 mEq/L 97  102  103   CO2 19 - 32 mEq/L 33  33  33   Calcium  8.4 - 10.5 mg/dL 9.4  9.7  9.3   Total Protein 6.0 - 8.3 g/dL 6.6  7.0  6.9   Total Bilirubin 0.2 - 1.2 mg/dL 0.7  0.7  0.6   Alkaline Phos 39 - 117 U/L 126  115  106   AST 0 - 37 U/L 11  14  12    ALT 0 - 53 U/L 11  21  14        Latest Ref Rng & Units 05/02/2023    8:14 AM 11/03/2022    9:39 AM 08/13/2022    9:32 AM  CBC  WBC 4.0 -  10.5 K/uL 10.5  9.1  9.4   Hemoglobin 13.0 - 17.0 g/dL 16.1  09.6  04.5   Hematocrit 39.0 - 52.0 % 40.8  36.3  42.4   Platelets 150 - 400 K/uL 223  209  223.0      Surgical Pathology  CASE: [id number]  PATIENT: Jacaden Forbush Foote  Bone Marrow Report   Clinical History: MGUS, left posterior iliac crest, (ADC)   DIAGNOSIS:   BONE MARROW, ASPIRATE, CLOT, CORE:  - Plasma cell myeloma, see comment.  - No amyloid deposits.  - Minimal iron stores.   PERIPHERAL BLOOD:  - Normocytic anemia.   COMMENT:   The marrow is mildly hypercellular with increased monotypic plasma cells  (9% aspirate, 10-15% CD138 immunohistochemistry). There are no amyloid  deposits seen with Congo red, although there is a lack of larger vessels  in the core biopsy. The findings are consistent with plasma cell  myeloma.   MICROSCOPIC DESCRIPTION:   PERIPHERAL BLOOD SMEAR: There is a normocytic anemia with scattered  elliptocytes/ovalocytes.  There is no rouleaux formation.   Leukocytes  are present in normal numbers.  Circulating plasma cells are not  identified.  Platelets are present in normal numbers.   BONE MARROW ASPIRATE: Spicular cellular.  Erythroid precursors: Present in appropriate proportions.  No  significant dysplasia.  Granulocytic precursors: Present in appropriate proportions.  No  significant dysplasia.  No increase in blasts.  Megakaryocytes: Present and largely unremarkable.  Lymphocytes/plasma cells: There is a mild increase in plasma cells (9%  by manual differential counts) with scattered atypical forms (large  forms, binucleation).  Lymphocytes are not increased.   TOUCH PREPARATIONS: Similar to aspirate smears.   CLOT AND BIOPSY: The core biopsies and clot sections are mildly  hypercellular for age (20 to 30%).  CD138 immunohistochemistry reveals  increased plasma cells (10 to 15%) scattered in small clusters.  By  light chain in situ hybridization plasma cells are lambda restricted.  Myeloid and erythroid elements are present in appropriate proportions.  Megakaryocytes exhibit a spectrum of maturation without tight clusters.  There are few benign appearing lymphoid aggregates. Congo red is  negative for amyloid deposits.   IRON STAIN: Iron stains are performed on a bone marrow aspirate or touch  imprint smear and section of clot. The controls stained appropriately.        Storage Iron: Minimal.       Ring Sideroblasts: Absent.   ADDITIONAL DATA/TESTING: Cytogenetics, including FISH for myeloma, was  ordered and will be reported separately.   CELL COUNT DATA:   Bone Marrow count performed on 500 cells shows:  Blasts:   0%   Myeloid:  47%  Promyelocytes: 0%   Erythroid:     23%  Myelocytes:    5%   Lymphocytes:   17%  Metamyelocytes:     0%   Plasma cells:  9%  Bands:    16%  Neutrophils:   22%  M:E ratio:     2.04  Eosinophils:   4%  Basophils:     0%  Monocytes:     4%   Lab Data: CBC performed on 04/23/2019 shows:   WBC: 8.6 k/uL  Neutrophils:   71%  Hgb: 12.4 g/dL Lymphocytes:   40%  HCT: 38.0 %    Monocytes:     3%  MCV: 92.7 fL   Eosinophils:   0%  RDW: 13.8 %    Basophils:     1%  PLT: 221 k/uL  GROSS DESCRIPTION:   A: Aspirate smear   B: The specimen is received in B-plus fixative and consists of a 21.0 x  12.0 x 5.0 mm aggregate of red-brown clotted blood.  The specimen is  entirely submitted in cassette B.   C: The specimen is received in B-plus fixative and consists of 2 cores  of tan bone, measuring 0.6 and 0.9 cm in length by 0.2 cm in diameter.  The specimen is entirely submitted in cassette C. Jeffrey Mini 04/25/2019)   Final Diagnosis performed by Sherwin Donate, MD.   Electronically signed  04/25/2019  Technical and / or Professional components performed at [facility], [address] [place], [place] [address].   Immunohistochemistry Technical component (if applicable) was performed  at [facility]. [address], [address],  [place], [place] [address].   IMMUNOHISTOCHEMISTRY DISCLAIMER (if applicable):  Some of these immunohistochemical stains may have been developed and the  performance characteristics determine by [facility]. Some  may not have been cleared or approved by the U.S. Food and Drug  Administration. The FDA has determined that such clearance or approval  is not necessary. This test is used for clinical purposes. It should not  be regarded as investigational or for research. This laboratory is  certified under the Clinical Laboratory Improvement Amendments of 1988  (CLIA-88) as qualified to perform high complexity clinical laboratory  testing.  The controls stained appropriately.   RADIOGRAPHIC STUDIES: No relevant radiographic studies.   ASSESSMENT & PLAN [name] is a 89 y.o. male with medical history significant for CAD s/p PCI in 2012, DM type II, Prostate cancer s/p brachytherapy, and HTN who presents for  follow up for his smoldering multiple myeloma.   #Smoldering Multiple Myeloma, Intermediate Risk --confirmed diagnosis based on the bone marrow biopsy, with plasma cells of 10-15% and no CRAB criteria. No evidence of amyloidosis on bone marrow stain.  --treatment of Smoldering multiple myeloma is a newer idea and predominately consists of monotherapy lenalidomide. Using the [facility] 2018 20/2/20 guidelines, [name] is a borderline Intermediate Risk SMM.  --given his advance aged and lack of any CRAB criteria, I would recommend holding on treatment at this time with close clinical monitoring for progression. [name] was in agreement with close continued monitoring.  --will collect SPEP, UPEP, and SFLC on a routine basis, q 6 months.  -- will order metastatic survey yearly PLAN: --labs today show WBC *** --most recent bone met survey from 05/04/2022 didn't show any evidence of lytic lesions or pathologic fractures. Next one due around November 2024.  --RTC in 6 months time unless remaining labs require any intervention.   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  - PSA <0.1 at last visit   All questions were answered. The patient knows to call the clinic with any problems, questions or concerns.  I have spent a total of 25 minutes minutes of face-to-face and non-face-to-face time, preparing to see the patient,  performing a medically appropriate examination, counseling and educating the patient, ordering tests/procedures, communicating with other health care professionals, documenting clinical information in the electronic health record,  and care coordination.   Donald Clay, MD Department of Hematology/Oncology [facility] Phone: [phone] Pager: [phone] Email: [email or web address]    10/31/2023 10:37 PM  Literature Support:  Carletta Cheeks, Twila Gale AM, Buadi FK, Otis Blocker, Matous JV, Anderson DM, Emmons RV, Mahindra A, Wagner LI, Dhodapkar MV, Rajkumar SV. Randomized Trial of Lenalidomide Versus Observation in Smoldering Multiple Myeloma. J Clin Oncol. 2020 Apr 10;38(11):1126-1137. doi: 10.1200/JCO.19.01740. Epub 2019 Oct 25. PMID: 46962952; PMCID: [id number]. --Progression-free survival was significantly longer with lenalidomide compared with observation (hazard ratio, 0.28; 95% CI, 0.12 to 0.62; P = .002). One-, 2-, and 3-year progression-free survival was 98%, 93%, and 91% for the lenalidomide arm versus 89%, 76%, and 66% for the observation arm, respectively.  Lakshman, A., Rajkumar, S.V., Buadi, F.K. et al. Risk stratification of smoldering multiple myeloma incorporating revised IMWG diagnostic criteria. Blood Cancer Journal 8, 59 (2018). [email or web address] --The median TTP for low-, intermediate-, and high-risk groups were 110, 68, and 29 months, respectively (p?<?0.0001). BMPC%?>?20%, M-protein?>?2?g/dL, and UVOZ?>?36 at diagnosis can be used to risk stratify patients with SMM. Patients with high-risk SMM need close follow-up and are candidates for clinical trials aiming to prevent progression.

## 2023-11-01 ENCOUNTER — Inpatient Hospital Stay (HOSPITAL_BASED_OUTPATIENT_CLINIC_OR_DEPARTMENT_OTHER): Admitting: Hematology and Oncology

## 2023-11-01 ENCOUNTER — Ambulatory Visit: Payer: Medicare HMO | Admitting: Physician Assistant

## 2023-11-01 ENCOUNTER — Other Ambulatory Visit: Payer: Medicare HMO

## 2023-11-01 ENCOUNTER — Inpatient Hospital Stay: Attending: Hematology and Oncology

## 2023-11-01 VITALS — BP 133/72 | HR 68 | Temp 97.5°F | Resp 13 | Wt 127.8 lb

## 2023-11-01 DIAGNOSIS — D649 Anemia, unspecified: Secondary | ICD-10-CM | POA: Insufficient documentation

## 2023-11-01 DIAGNOSIS — Z923 Personal history of irradiation: Secondary | ICD-10-CM | POA: Insufficient documentation

## 2023-11-01 DIAGNOSIS — C61 Malignant neoplasm of prostate: Secondary | ICD-10-CM

## 2023-11-01 DIAGNOSIS — D472 Monoclonal gammopathy: Secondary | ICD-10-CM

## 2023-11-01 DIAGNOSIS — Z8546 Personal history of malignant neoplasm of prostate: Secondary | ICD-10-CM | POA: Insufficient documentation

## 2023-11-01 LAB — CBC WITH DIFFERENTIAL (CANCER CENTER ONLY)
Abs Immature Granulocytes: 0.04 10*3/uL (ref 0.00–0.07)
Basophils Absolute: 0.1 10*3/uL (ref 0.0–0.1)
Basophils Relative: 1 %
Eosinophils Absolute: 0.4 10*3/uL (ref 0.0–0.5)
Eosinophils Relative: 4 %
HCT: 40.8 % (ref 39.0–52.0)
Hemoglobin: 13.6 g/dL (ref 13.0–17.0)
Immature Granulocytes: 0 %
Lymphocytes Relative: 29 %
Lymphs Abs: 3 10*3/uL (ref 0.7–4.0)
MCH: 29.1 pg (ref 26.0–34.0)
MCHC: 33.3 g/dL (ref 30.0–36.0)
MCV: 87.2 fL (ref 80.0–100.0)
Monocytes Absolute: 1 10*3/uL (ref 0.1–1.0)
Monocytes Relative: 9 %
Neutro Abs: 6.1 10*3/uL (ref 1.7–7.7)
Neutrophils Relative %: 57 %
Platelet Count: 241 10*3/uL (ref 150–400)
RBC: 4.68 MIL/uL (ref 4.22–5.81)
RDW: 14 % (ref 11.5–15.5)
WBC Count: 10.6 10*3/uL — ABNORMAL HIGH (ref 4.0–10.5)
nRBC: 0 % (ref 0.0–0.2)

## 2023-11-01 LAB — CMP (CANCER CENTER ONLY)
ALT: 17 U/L (ref 0–44)
AST: 14 U/L — ABNORMAL LOW (ref 15–41)
Albumin: 4.1 g/dL (ref 3.5–5.0)
Alkaline Phosphatase: 98 U/L (ref 38–126)
Anion gap: 9 (ref 5–15)
BUN: 28 mg/dL — ABNORMAL HIGH (ref 8–23)
CO2: 31 mmol/L (ref 22–32)
Calcium: 9.5 mg/dL (ref 8.9–10.3)
Chloride: 102 mmol/L (ref 98–111)
Creatinine: 1.57 mg/dL — ABNORMAL HIGH (ref 0.61–1.24)
GFR, Estimated: 42 mL/min — ABNORMAL LOW (ref >60)
Glucose, Bld: 354 mg/dL — ABNORMAL HIGH (ref 70–99)
Potassium: 3.9 mmol/L (ref 3.5–5.1)
Sodium: 142 mmol/L (ref 135–145)
Total Bilirubin: 0.7 mg/dL (ref 0.0–1.2)
Total Protein: 7.1 g/dL (ref 6.5–8.1)

## 2023-11-01 LAB — LACTATE DEHYDROGENASE: LDH: 136 U/L (ref 98–192)

## 2023-11-02 LAB — KAPPA/LAMBDA LIGHT CHAINS
Kappa free light chain: 33.1 mg/L — ABNORMAL HIGH (ref 3.3–19.4)
Kappa, lambda light chain ratio: 0.06 — ABNORMAL LOW (ref 0.26–1.65)
Lambda free light chains: 590.7 mg/L — ABNORMAL HIGH (ref 5.7–26.3)

## 2023-11-04 LAB — MULTIPLE MYELOMA PANEL, SERUM
Albumin SerPl Elph-Mcnc: 3.3 g/dL (ref 2.9–4.4)
Albumin/Glob SerPl: 1.2 (ref 0.7–1.7)
Alpha 1: 0.2 g/dL (ref 0.0–0.4)
Alpha2 Glob SerPl Elph-Mcnc: 0.8 g/dL (ref 0.4–1.0)
B-Globulin SerPl Elph-Mcnc: 0.9 g/dL (ref 0.7–1.3)
Gamma Glob SerPl Elph-Mcnc: 1 g/dL (ref 0.4–1.8)
Globulin, Total: 3 g/dL (ref 2.2–3.9)
IgA: 178 mg/dL (ref 61–437)
IgG (Immunoglobin G), Serum: 1091 mg/dL (ref 603–1613)
IgM (Immunoglobulin M), Srm: 68 mg/dL (ref 15–143)
Total Protein ELP: 6.3 g/dL (ref 6.0–8.5)

## 2023-11-15 ENCOUNTER — Ambulatory Visit (INDEPENDENT_AMBULATORY_CARE_PROVIDER_SITE_OTHER): Admitting: *Deleted

## 2023-11-15 VITALS — BP 130/56 | Temp 97.8°F | Resp 16 | Ht 64.5 in | Wt 128.8 lb

## 2023-11-15 DIAGNOSIS — Z0000 Encounter for general adult medical examination without abnormal findings: Secondary | ICD-10-CM | POA: Diagnosis not present

## 2023-11-15 NOTE — Progress Notes (Signed)
 Subjective:   [name] is a 89 y.o. who presents for a Medicare Wellness preventive visit.  As a reminder, Annual Wellness Visits don't include a physical exam, and some assessments may be limited, especially if this visit is performed virtually. We may recommend an in-person follow-up visit with your provider if needed.  Visit Complete: In person  Persons Participating in Visit: Patient assisted by son.  AWV Questionnaire: Yes: Patient Medicare AWV questionnaire was completed by the patient on 11/14/23; I have confirmed that all information answered by patient is correct and no changes since this date.  Cardiac Risk Factors include: diabetes mellitus;dyslipidemia;male gender;hypertension;advanced age (>39men, >48 women) (CAD)     Objective:     Today's Vitals   11/14/23 1017 11/15/23 0822 11/15/23 0833 11/15/23 0859  BP:  (!) 142/61  (!) 130/56  Resp:  16    Temp:  97.8 F (36.6 C)    TempSrc:  Oral    SpO2:  98%    Weight:  128 lb 12.8 oz (58.4 kg)    Height:  5' 4.5" (1.638 m)    PainSc: 0-No pain  0-No pain    Body mass index is 21.77 kg/m.     11/15/2023    8:43 AM 06/06/2023    8:53 AM 11/29/2022    8:53 AM 09/17/2022    9:39 AM 08/31/2022    9:32 AM 09/15/2021    9:51 AM 07/27/2021   11:25 AM  Advanced Directives  Does Patient Have a Medical Advance Directive? Yes No Yes Yes No Yes Yes  Type of Estate agent of [place];Living will  Healthcare Power of [employer] of [place];Living will  Healthcare Power of [place];Out of facility DNR (pink MOST or yellow form);Living will Healthcare Power of [place];Living will  Does patient want to make changes to medical advance directive? No - Patient declined  No - Patient declined No - Patient declined     Copy of Healthcare Power of Attorney in Chart? Yes - validated most recent copy scanned in chart (See row information)  No - copy requested No - copy requested  No - copy requested    Would patient like information on creating a medical advance directive?       No - Patient declined    Current Medications (verified) Outpatient Encounter Medications as of 11/15/2023  Medication Sig   ABRYSVO 120 MCG/0.5ML injection    acetaminophen  (TYLENOL ) 500 MG tablet Take 2 tablets (1,000 mg total) by mouth every 6 (six) hours as needed.   Alcohol  Swabs  PADS Use as directed once a day   alendronate  (FOSAMAX ) 70 MG tablet TAKE 1 TABLET ONCE WEEKLY *TAKE WITH A FULL GLASS OF WATER ON AN EMPTY STOMACH*   amLODipine  (NORVASC ) 5 MG tablet TAKE 1.5 TABLETS BY MOUTH DAILY.   aspirin 81 MG tablet Take 81 mg by mouth daily.   atorvastatin  (LIPITOR) 20 MG tablet TAKE 1 TABLET EVERY DAY   Blood Glucose Calibration (TRUE METRIX LEVEL 1) Low SOLN USE AS DIRECTED   Blood Glucose Calibration (TRUE METRIX LEVEL 3) High SOLN Use to check controls on glucometer strips every 30 days or with each new bottle of strips (whichever comes first).   Blood Glucose Monitoring Suppl (TRUE METRIX METER) w/Device KIT USE AS DIRECTED ONCE A DAY   BOOSTRIX 5-2.5-18.5 LF-MCG/0.5 injection    brimonidine (ALPHAGAN) 0.2 % ophthalmic solution SMARTSIG:In Eye(s)   brimonidine-timolol (COMBIGAN) 0.2-0.5 % ophthalmic solution Place 1 drop into both  eyes 2 (two) times daily.   COLLAGEN PO Take by mouth. 1 scoop daily   dorzolamide-timolol (COSOPT) 2-0.5 % ophthalmic solution SMARTSIG:In Eye(s)   empagliflozin  (JARDIANCE ) 25 MG TABS tablet Take 1 tablet (25 mg total) by mouth daily before breakfast.   fish oil-omega-3 fatty acids 1000 MG capsule Take 2 g by mouth daily.   FLUZONE HIGH-DOSE 0.5 ML injection    glucose blood (TRUE METRIX BLOOD GLUCOSE TEST) test strip TEST BLOOD SUGAR ONE TIME DAILY   insulin  glargine (LANTUS  SOLOSTAR) 100 UNIT/ML Solostar Pen Inject 26 Units into the skin daily.   Insulin  Pen Needle 32G X 4 MM MISC 1 Device by Does not apply route daily in the afternoon.   latanoprost (XALATAN) 0.005 %  ophthalmic solution SMARTSIG:In Eye(s)   losartan -hydrochlorothiazide (HYZAAR) 100-25 MG tablet TAKE 1 TABLET EVERY DAY   memantine  (NAMENDA ) 5 MG tablet Take 1 tablet (5 mg total) by mouth 2 (two) times daily.   metoprolol  succinate (TOPROL -XL) 50 MG 24 hr tablet TAKE 1 TABLET EVERY DAY WITH OR IMMEDIATELY FOLLOWING A MEAL   Multiple Vitamin (MULTIVITAMIN) tablet Take 1 tablet by mouth daily.   SHINGRIX injection    SPIKEVAX syringe    tamsulosin  (FLOMAX ) 0.4 MG CAPS capsule TAKE 1 CAPSULE EVERY DAY AFTER SUPPER   TRUEplus Lancets 33G MISC Use to check blood sugar once a day.  DX  E11.9   vitamin B-12 (CYANOCOBALAMIN ) 100 MCG tablet Take 100 mcg by mouth daily.   No facility-administered encounter medications on file as of 11/15/2023.    Allergies (verified) Patient has no known allergies.   History: Past Medical History:  Diagnosis Date   Anemia, unspecified 10/27/2009   BPH with urinary obstruction 12/13/2012   CAD (coronary artery disease)    LHC 03/25/11 by Dr. Arlester Ladd:  pLAD 99%, oCFX 20-30%, pOM1 40%, dAVCFX 20-30%.  EF was normal on nuclear study.  He was treated with a Promus DES to his pLAD.    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/2023   Monoclonal gammopathy 04/06/2019   Multiple closed fractures of ribs of left side 03/21/2019   Multiple myeloma not having achieved remission 08/13/2022   Osteoporosis 03/21/2019   Peripheral vascular disorder due to diabetes mellitus 08/13/2022   Personal history of colonic polyps 1996   villous adenoma   Primary open angle glaucoma (POAG) of left eye, mild stage 02/04/2019   Primary open angle glaucoma (POAG)  of right eye, moderate stage 02/04/2019   Prostate cancer 02/15/2017   Stage 3b chronic kidney disease 02/06/2020   Type II diabetes mellitus 07/04/2006   Vitamin D  deficiency 01/28/2021   Past Surgical History:  Procedure Laterality Date   CARDIAC CATHETERIZATION  03/25/2011   cataract Bilateral 2010   corenea implants  june and sept 2018   dr Zoila Hines [facility]   CORONARY STENT PLACEMENT  03/25/2011   RADIOACTIVE SEED IMPLANT N/A 05/06/2017   Procedure: RADIOACTIVE SEED IMPLANT/BRACHYTHERAPY IMPLANT;  Surgeon: Ottelin, Mark, MD;  Location: [facility];  Service: Urology;  Laterality: N/A;   SPACE OAR INSTILLATION N/A 05/06/2017   Procedure: SPACE OAR INSTILLATION;  Surgeon: Ottelin, Mark, MD;  Location: [facility];  Service: Urology;  Laterality: N/A;   Family History  Problem Relation Age of Onset   Stomach cancer Mother 12   Coronary artery disease Father 5       deceased   Healthy Son    Healthy Son    Healthy Son    Social History   Socioeconomic History   Marital status: Widowed    Spouse name: Not on file   Number of children: Not on file   Years of education: 14   Highest education level: Associate degree: academic program  Occupational History   Occupation: Retired  Tobacco Use   Smoking status: Former    Current packs/day: 0.00    Average packs/day: 0.3 packs/day for 3.0 years (0.8 ttl pk-yrs)    Types: Cigarettes    Start date: 06/08/1955    Quit date: 06/07/1958    Years since quitting: 65.4   Smokeless tobacco: Never  Vaping Use   Vaping status: Never Used  Substance and Sexual Activity   Alcohol  use: Not Currently   Drug use: No   Sexual activity: Not Currently  Other Topics Concern   Not on file  Social History Narrative   Right   Lives with son    Retired   Two story home   Caffeine  2 cup a day   Still drives   Social Drivers of Health   Financial Resource Strain: Low Risk  (11/15/2023)   Overall Financial  Resource Strain (CARDIA)    Difficulty of Paying Living Expenses: Not very hard  Food Insecurity: No Food Insecurity (11/15/2023)   Hunger Vital Sign    Worried About Running Out of Food in the Last Year: Never true    Ran Out of Food in the Last Year: Never true  Transportation Needs: No Transportation Needs (08/11/2023)   PRAPARE - Administrator, Civil Service (Medical): No    Lack of Transportation (Non-Medical): No  Physical Activity: Sufficiently Active (11/15/2023)   Exercise Vital Sign    Days of Exercise per Week: 3 days    Minutes of Exercise per Session: 50 min  Stress: No Stress Concern Present (11/15/2023)   [employer] of Occupational Health - Occupational Stress Questionnaire    Feeling of Stress : Not at all  Social Connections: Moderately Integrated (11/15/2023)   Social Connection and Isolation Panel [NHANES]    Frequency of Communication with Friends and Family: More than three times a week    Frequency of Social Gatherings with Friends and Family: Three times a week    Attends Religious Services: More than 4 times per year    Active Member of Clubs or Organizations: Yes    Attends Banker Meetings: More than 4 times per year    Marital Status: Widowed    Tobacco Counseling Counseling given: Not Answered    Clinical Intake:  Pre-visit preparation completed: Yes  Pain : No/denies pain Pain Score: 0-No pain     BMI - recorded: 21.77 Nutritional Status: BMI of 19-24  Normal Nutritional Risks: None Diabetes: Yes CBG done?: No Did pt. bring in CBG monitor from home?: No  Lab Results  Component Value Date   HGBA1C 11.5 (A) 08/12/2023   HGBA1C 12.6 (A) 05/13/2023   HGBA1C 12.9 (A) 12/27/2022     What is the last grade level you completed in school?: Associate Degree  Interpreter Needed?: No  Information entered by :: Susa Engman, CMA   Activities of Daily Living     11/15/2023  8:36 AM 11/14/2023   10:17 AM  In  your present state of health, do you have any difficulty performing the following activities:  Hearing? 0 0  Vision? 0 0  Comment last eye exam 09/2023 w/Jeffrey Bond last eye exam 09/2023 w/Jeffrey Bond  Difficulty concentrating or making decisions? 1 1  Walking or climbing stairs? 0 0  Dressing or bathing? 0 0  Doing errands, shopping? 1 1  Preparing Food and eating ? N N  Using the Toilet? N N  In the past six months, have you accidently leaked urine? N N  Do you have problems with loss of bowel control? N N  Managing your Medications? Y Y  Managing your Finances? Colie Dawes  Housekeeping or managing your Housekeeping? N N    Patient Care Team: Crecencio Dodge, Candida Chalk, DO as PCP - General (Family Medicine) Ottelin, Mark, MD (Inactive) as Consulting Physician (Urology) Kenith Payer, MD as Consulting Physician (Radiation Oncology) Ladon Pickler, MD as Referring Physician (Ophthalmology) Ander Bame, MD as Consulting Physician (Hematology and Oncology) [facility], Julian Obey, MD as Consulting Physician (Endocrinology) Roxane Copp, MD as Consulting Physician (Urology) Leandra Pro, MD as Consulting Physician (Nephrology) Cecilie Coffee, RPH-CPP (Pharmacist) [name], [name], PA-C (Neurology) Bond, Muriel Arm, MD as Referring Physician (Ophthalmology)  I have updated your Care Teams any recent Medical Services you may have received from other providers in the past year.     Assessment:    This is a routine wellness examination for Est[place].  Hearing/Vision screen Hearing Screening - Comments:: Denies difficulty Vision Screening - Comments:: Dr Leanor Proper 09/2023, will ask for diabetic eye exam.   Goals Addressed               This Visit's Progress     maintain healthy active lifestyle. (pt-stated)   On track     Patient Stated   Not on track     Drink more water. Currently 4-6 glasses a day       Depression Screen     11/15/2023    8:40 AM 09/17/2022     9:44 AM 08/13/2022    9:04 AM 09/15/2021    9:52 AM 12/04/2020    8:38 AM 09/11/2020    8:48 AM 03/24/2020   11:02 AM  PHQ 2/9 Scores  PHQ - 2 Score 0 0 0 0 0 0 0    Fall Risk     11/15/2023    8:42 AM 11/14/2023   10:17 AM 06/06/2023    8:53 AM 11/29/2022    8:53 AM 09/20/2022    1:19 PM  Fall Risk   Falls in the past year? 0 0 0 0 1  Number falls in past yr: 0  0 0 1  Injury with Fall? 0  0 0 1  Risk for fall due to : Mental status change    Impaired balance/gait;History of fall(s)  Follow up Education provided  Falls evaluation completed Falls evaluation completed Falls evaluation completed    MEDICARE RISK AT HOME:  Medicare Risk at Home Any stairs in or around the home?: Yes If so, are there any without handrails?: No Home free of loose throw rugs in walkways, pet beds, electrical cords, etc?: Yes Adequate lighting in your home to reduce risk of falls?: Yes Life alert?: No Use of a cane, walker or w/c?: No Grab bars in the bathroom?: No Shower chair or bench in shower?: No Elevated toilet seat or a handicapped toilet?: No  TIMED UP AND GO:  Was the test performed?  Yes  Length of time to ambulate 10 feet: 5 sec Gait steady and fast without use of assistive device  Cognitive Function: 6CIT completed      08/31/2022   10:00 AM  Montreal Cognitive Assessment   Visuospatial/ Executive (0/5) 3  Naming (0/3) 3  Attention: Read list of digits (0/2) 1  Attention: Read list of letters (0/1) 1  Attention: Serial 7 subtraction starting at 100 (0/3) 1  Language: Repeat phrase (0/2) 0  Language : Fluency (0/1) 0  Abstraction (0/2) 2  Delayed Recall (0/5) 0  Orientation (0/6) 5  Total 16  Adjusted Score (based on education) 16      11/15/2023    8:44 AM 09/17/2022    9:51 AM 09/15/2021    9:57 AM 09/11/2020    8:57 AM  6CIT Screen  What Year? 4 points 0 points 0 points 0 points  What month? 0 points 3 points 0 points 0 points  What time? 0 points 3 points 0 points 0  points  Count back from 20 0 points 0 points 0 points 0 points  Months in reverse 4 points 0 points 4 points 2 points  Repeat phrase 10 points 10 points 0 points 2 points  Total Score 18 points 16 points 4 points 4 points  Pt currently on medication for memory.  Immunizations Immunization History  Administered Date(s) Administered   Fluad Quad(high Dose 65+) 04/06/2019, 04/28/2020, 08/03/2021   Influenza Split 03/05/2011, 04/24/2012   Influenza Whole 02/27/2008, 03/04/2010   Influenza, High Dose Seasonal PF 04/01/2017, 03/14/2018   Influenza,inj,quad, With Preservative 03/07/2013   Influenza-Unspecified 03/08/2015, 01/31/2023   PFIZER(Purple Top)SARS-COV-2 Vaccination 08/13/2019, 09/18/2019, 03/21/2020   Pneumococcal Conjugate-13 08/26/2014   Pneumococcal Polysaccharide-23 02/27/2008, 07/31/2020   Tdap 01/31/2023   Zoster Recombinant(Shingrix) 01/31/2023, 08/15/2023    Screening Tests Health Maintenance  Topic Date Due   OPHTHALMOLOGY EXAM  04/01/2023   Medicare Annual Wellness (AWV)  09/17/2023   COVID-19 Vaccine (4 - 2024-25 season) 11/14/2024 (Originally 02/06/2023)   INFLUENZA VACCINE  01/06/2024   HEMOGLOBIN A1C  02/12/2024   FOOT EXAM  08/11/2024   DTaP/Tdap/Td (2 - Td or Tdap) 01/30/2033   Pneumonia Vaccine 46+ Years old  Completed   Zoster Vaccines- Shingrix  Completed   HPV VACCINES  Aged Out   Meningococcal B Vaccine  Aged Out    Health Maintenance  Health Maintenance Due  Topic Date Due   OPHTHALMOLOGY EXAM  04/01/2023   Medicare Annual Wellness (AWV)  09/17/2023   Health Maintenance Items Addressed: Pt declines further COVID vaccines at this time. Had eye exam with DR Bond 09/2023 but no mention about retinopathy. Sent record request.  Additional Screening:  Vision Screening: Recommended annual ophthalmology exams for early detection of glaucoma and other disorders of the eye. Would you like a referral to an eye doctor? No    Dental Screening:  Recommended annual dental exams for proper oral hygiene  Community Resource Referral / Chronic Care Management: CRR required this visit?  No   CCM required this visit?  No   Plan:    I have personally reviewed and noted the following in the patient's chart:   Medical and social history Use of alcohol , tobacco or illicit drugs  Current medications and supplements including opioid prescriptions. Patient is not currently taking opioid prescriptions. Functional ability and status Nutritional status Physical activity Advanced directives List of other physicians Hospitalizations, surgeries, and ER visits  in previous 12 months Vitals Screenings to include cognitive, depression, and falls Referrals and appointments  In addition, I have reviewed and discussed with patient certain preventive protocols, quality metrics, and best practice recommendations. A written personalized care plan for preventive services as well as general preventive health recommendations were provided to patient.   Susa Engman, CMA   11/15/2023   After Visit Summary: (In Person-Printed) AVS printed and given to the patient  Notes: Nothing significant to report at this time.

## 2023-11-15 NOTE — Patient Instructions (Addendum)
 [name] , Thank you for taking time out of your busy schedule to complete your Annual Wellness Visit with me. I enjoyed our conversation and look forward to speaking with you again next year. I, as well as your care team,  appreciate your ongoing commitment to your health goals. Please review the following plan we discussed and let me know if I can assist you in the future. Your Game plan/ To Do List     Follow up Visits: Next Medicare AWV with our clinical staff: 11/15/24 8:20am   Next Office Visit with your provider:  02/16/24  Please check with Dr Leanor Proper to get your diabetic eye exam to check for retinopathy.   Clinician Recommendations:  Aim for 30 minutes of exercise or brisk walking, 6-8 glasses of water, and 5 servings of fruits and vegetables each day.       This is a list of the screening recommended for you and due dates:  Health Maintenance  Topic Date Due   Eye exam for diabetics  04/01/2023   Medicare Annual Wellness Visit  09/17/2023   COVID-19 Vaccine (4 - 2024-25 season) 11/14/2024*   Flu Shot  01/06/2024   Hemoglobin A1C  02/12/2024   Complete foot exam   08/11/2024   DTaP/Tdap/Td vaccine (2 - Td or Tdap) 01/30/2033   Pneumonia Vaccine  Completed   Zoster (Shingles) Vaccine  Completed   HPV Vaccine  Aged Out   Meningitis B Vaccine  Aged Out  *Topic was postponed. The date shown is not the original due date.    See attachments for and Fall Prevention Tips.

## 2023-12-05 ENCOUNTER — Ambulatory Visit: Payer: Medicare HMO | Admitting: Physician Assistant

## 2023-12-12 ENCOUNTER — Ambulatory Visit: Admitting: Internal Medicine

## 2023-12-12 ENCOUNTER — Encounter: Payer: Self-pay | Admitting: Internal Medicine

## 2023-12-12 VITALS — BP 122/78 | HR 58 | Ht 64.5 in | Wt 129.0 lb

## 2023-12-12 DIAGNOSIS — Z7984 Long term (current) use of oral hypoglycemic drugs: Secondary | ICD-10-CM

## 2023-12-12 DIAGNOSIS — E1165 Type 2 diabetes mellitus with hyperglycemia: Secondary | ICD-10-CM

## 2023-12-12 DIAGNOSIS — Z794 Long term (current) use of insulin: Secondary | ICD-10-CM

## 2023-12-12 LAB — POCT GLYCOSYLATED HEMOGLOBIN (HGB A1C): Hemoglobin A1C: 10.5 % — AB (ref 4.0–5.6)

## 2023-12-12 MED ORDER — LINAGLIPTIN 5 MG PO TABS: 5.0000 mg | ORAL_TABLET | Freq: Every day | ORAL | 3 refills | Status: AC

## 2023-12-12 NOTE — Progress Notes (Signed)
 Name: [name]  Age/ Sex: 89 y.o., male   MRN/ DOB: [id number], Jun 14, 1931     PCP: [name], Jamee SAUNDERS, DO   Reason for Endocrinology Evaluation: Type 2 Diabetes Mellitus  Initial Endocrine Consultative Visit: 02/06/2020    PATIENT IDENTIFIER: [name] is a 89 y.o. male with a past medical history of T2DM, CAD and HTN. The patient has followed with Endocrinology clinic since 02/06/2020 for consultative assistance with management of his diabetes.  DIABETIC HISTORY:  [name] was diagnosed with DM in 2008, has been on Glimepiride  for years, janumet  started in 01/2020. His hemoglobin A1c has ranged from 6.7% in 2017, peaking at 9.6%in 2021.   On initial visit his A1c was 9.6 % , he was just started on Janumet  and we did not make any changes and continued Glimepiride .  Discontinued Janumet  and started Jardiance  only due to low GFR 08/2021    SUBJECTIVE:   During the last visit (08/12/2023): A1c 11.5%.     Today (12/12/2023): [name] is here for a follow up on diabetes management.  He is accompanied by his son Signe He checks glucose daily  at home.   Patient lives with his son Chyrl, who has been managing his medications.   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 He had a follow-up with vascular surgery for carotid artery disease, remains asymptomatic He continues to follow-up with neurology for dementia    Denies constipation or diarrhea  Denies nausea or vomiting  Denies urinary issues    HOME DIABETES REGIMEN:  Lantus  26 units daily Jardiance  25 mg daily      Statin: yes ACE-I/ARB: yes   GLUCOSE LOG:   95 -332 mg/dL    DIABETIC COMPLICATIONS: Microvascular complications:  CKD III Denies: retinopathy, neuropathy Last Eye Exam: Completed 09/19/2023  Macrovascular complications:  CAD ( S/P stent) Denies:  CVA, PVD   HISTORY:  Past Medical History:  Past Medical History:  Diagnosis Date    Anemia, unspecified 10/27/2009   BPH with urinary obstruction 12/13/2012   CAD (coronary artery disease)    LHC 03/25/11 by Dr. Wonda:  pLAD 99%, oCFX 20-30%, pOM1 40%, dAVCFX 20-30%.  EF was normal on nuclear study.  He was treated with a Promus DES to his pLAD.    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/2023   Monoclonal gammopathy 04/06/2019   Multiple closed fractures of ribs of left side 03/21/2019   Multiple myeloma not having achieved remission 08/13/2022   Osteoporosis 03/21/2019   Peripheral vascular disorder due to diabetes mellitus 08/13/2022   Personal history of colonic polyps 1996   villous adenoma   Primary open angle glaucoma (POAG) of left eye, mild stage 02/04/2019   Primary open angle glaucoma (POAG) of right eye, moderate stage 02/04/2019   Prostate cancer 02/15/2017   Stage 3b chronic kidney disease 02/06/2020   Type II diabetes mellitus 07/04/2006   Vitamin D  deficiency 01/28/2021   Past Surgical History:  Past Surgical History:  Procedure Laterality Date   CARDIAC CATHETERIZATION  03/25/2011   cataract Bilateral 2010   corenea implants  june and sept 2018  dr donnice [facility]   CORONARY STENT PLACEMENT  03/25/2011   RADIOACTIVE SEED IMPLANT N/A 05/06/2017   Procedure: RADIOACTIVE SEED IMPLANT/BRACHYTHERAPY IMPLANT;  Surgeon: Ottelin, Mark, MD;  Location: [facility];  Service: Urology;  Laterality: N/A;   SPACE OAR INSTILLATION N/A 05/06/2017   Procedure: SPACE OAR INSTILLATION;  Surgeon: Ottelin, Mark, MD;  Location: [facility];  Service: Urology;  Laterality: N/A;   Social History:  reports that he  quit smoking about 65 years ago. His smoking use included cigarettes. He started smoking about 68 years ago. He has a 0.8 pack-year smoking history. He has never used smokeless tobacco. He reports that he does not currently use alcohol . He reports that he does not use drugs. Family History:  Family History  Problem Relation Age of Onset   Stomach cancer Mother 37   Coronary artery disease Father 73       deceased   Healthy Son    Healthy Son    Healthy Son      HOME MEDICATIONS: Allergies as of 12/12/2023   No Known Allergies      Medication List        Accurate as of December 12, 2023  9:01 AM. If you have any questions, ask your nurse or doctor.          Abrysvo 120 MCG/0.5ML injection Generic drug: RSV bivalent vaccine   acetaminophen  500 MG tablet Commonly known as: TYLENOL  Take 2 tablets (1,000 mg total) by mouth every 6 (six) hours as needed.   Alcohol  Swabs  Pads Use as directed once a day   alendronate  70 MG tablet Commonly known as: FOSAMAX  TAKE 1 TABLET ONCE WEEKLY *TAKE WITH A FULL GLASS OF WATER ON AN EMPTY STOMACH*   amLODipine  5 MG tablet Commonly known as: NORVASC  TAKE 1.5 TABLETS BY MOUTH DAILY.   aspirin 81 MG tablet Take 81 mg by mouth daily.   atorvastatin  20 MG tablet Commonly known as: LIPITOR TAKE 1 TABLET EVERY DAY   Boostrix 5-2.5-18.5 LF-MCG/0.5 injection Generic drug: Tdap   brimonidine 0.2 % ophthalmic solution Commonly known as: ALPHAGAN SMARTSIG:In Eye(s)   brimonidine-timolol 0.2-0.5 % ophthalmic solution Commonly known as: COMBIGAN Place 1 drop into both eyes 2 (two) times daily.   COLLAGEN PO Take by mouth. 1 scoop daily   dorzolamide-timolol 2-0.5 % ophthalmic solution Commonly known as: COSOPT SMARTSIG:In Eye(s)   empagliflozin  25 MG Tabs tablet Commonly known as: Jardiance  Take 1 tablet (25 mg total) by mouth daily before breakfast.   fish oil-omega-3 fatty acids 1000 MG capsule Take 2 g by mouth daily.    Fluzone High-Dose 0.5 ML injection Generic drug: Influenza vac split quadrivalent PF   Insulin  Pen Needle 32G X 4 MM Misc 1 Device by Does not apply route daily in the afternoon.   Lantus  SoloStar 100 UNIT/ML Solostar Pen Generic drug: insulin  glargine Inject 26 Units into the skin daily.   latanoprost 0.005 % ophthalmic solution Commonly known as: XALATAN SMARTSIG:In Eye(s)   losartan -hydrochlorothiazide 100-25 MG tablet Commonly known as: HYZAAR TAKE 1 TABLET EVERY DAY   memantine  5 MG tablet Commonly known as: NAMENDA  Take 1 tablet (5 mg total) by mouth 2 (two) times daily.   metoprolol  succinate 50 MG 24 hr tablet Commonly known as: TOPROL -XL TAKE 1 TABLET EVERY DAY WITH OR IMMEDIATELY FOLLOWING A MEAL   multivitamin tablet Take 1 tablet by mouth daily.   Shingrix injection Generic drug: Zoster Vaccine Adjuvanted   Spikevax syringe Generic  drug: COVID-19 mRNA vaccine   tamsulosin  0.4 MG Caps capsule Commonly known as: FLOMAX  TAKE 1 CAPSULE EVERY DAY AFTER SUPPER   True Metrix Blood Glucose Test test strip Generic drug: glucose blood TEST BLOOD SUGAR ONE TIME DAILY   True Metrix Level 3 High Soln Use to check controls on glucometer strips every 30 days or with each new bottle of strips (whichever comes first).   True Metrix Level 1 Low Soln USE AS DIRECTED   True Metrix Meter w/Device Kit USE AS DIRECTED ONCE A DAY   TRUEplus Lancets 33G Misc Use to check blood sugar once a day.  DX  E11.9   vitamin B-12 100 MCG tablet Commonly known as: CYANOCOBALAMIN  Take 100 mcg by mouth daily.         OBJECTIVE:   Vital Signs: BP 122/78 (BP Location: Left Arm, Patient Position: Sitting, Cuff Size: Normal)   Pulse (!) 58   Ht 5' 4.5 (1.638 m)   Wt 129 lb (58.5 kg)   SpO2 97%   BMI 21.80 kg/m   Wt Readings from Last 3 Encounters:  12/12/23 129 lb (58.5 kg)  11/15/23 128 lb 12.8 oz (58.4 kg)  11/01/23 127 lb 12.8 oz (58 kg)     Exam: General: Pt  appears well and is in NAD  Lungs: Clear with good BS bilat   Heart: RRR   Extremities: No pretibial edema.  Neuro: MS is good with appropriate affect, pt is alert and Ox3   DM foot exam: 08/12/2023 The skin of the feet is without sores or ulcerations. The pedal pulses are 1+ on right and 1+ on left. The sensation is decreased  to a screening 5.07, 10 gram monofilament on the right          DATA REVIEWED:  Lab Results  Component Value Date   HGBA1C 11.5 (A) 08/12/2023   HGBA1C 12.6 (A) 05/13/2023   HGBA1C 12.9 (A) 12/27/2022    Latest Reference Range & Units 11/01/23 08:28  Sodium 135 - 145 mmol/L 142  Potassium 3.5 - 5.1 mmol/L 3.9  Chloride 98 - 111 mmol/L 102  CO2 22 - 32 mmol/L 31  Glucose 70 - 99 mg/dL 645 (H)  BUN 8 - 23 mg/dL 28 (H)  Creatinine 9.38 - 1.24 mg/dL 8.42 (H)  Calcium  8.9 - 10.3 mg/dL 9.5  Anion gap 5 - 15  9  Alkaline Phosphatase 38 - 126 U/L 98  Albumin 3.5 - 5.0 g/dL 4.1  AST 15 - 41 U/L 14 (L)  ALT 0 - 44 U/L 17  Total Protein 6.5 - 8.1 g/dL 7.1  Total Bilirubin 0.0 - 1.2 mg/dL 0.7  GFR, Est Non African American >60 mL/min 42 (L)    ASSESSMENT / PLAN / RECOMMENDATIONS:   1) Type 2 Diabetes Mellitus, poorly controlled, with CKD III and neuropathic and macrovascular complications - Most recent A1c of 10.5 %. Goal A1c < 8.5 %.     -He is currently living with his son Chyrl, who has been managing his medications due to the patient's dementia -Due to dementia and advanced age BG goal is to prevent  hypoglycemia and symptomatic/severe hyperglycemia -We switched Janumet  to Jardiance  due to a GFR of less than 45 - I once he continues to be above goal, but it is trending down - I have recommended starting Tradjenta  - I am unable to increase his insulin  at this time, as he did have a BG reading of 95 Mg/DL, I am afraid if  I increase his insulin  he will develop hypoglycemia   MEDICATIONS:  Start Tradjenta  5 mg daily Continue Lantus  26 units  daily Continue Jardiance  25 mg daily   EDUCATION / INSTRUCTIONS: BG monitoring instructions: Patient is instructed to check his blood sugars 1 times a day. Call [facility] if: BG persistently < 70  I reviewed the Rule of 15 for the treatment of hypoglycemia in detail with the patient. Literature supplied.    2) Diabetic complications:  Eye: Does not have known diabetic retinopathy.  Neuro/ Feet: Does have known diabetic peripheral neuropathy .  Renal: Patient does  have known baseline CKD. He   is  on an ACEI/ARB at present.   F/U in 4 months    Signed electronically by: Stefano Redgie Butts, MD  [facility] Endocr[facility]ology  [facility] [address] [place] [place], [place] [address] Phone: [phone] FAX: [phone]   CC: Antonio Cyndee Jamee JONELLE ROSALEA [address] [place] [address] Phone: [phone]  Fax: [phone]  Return to Endocrinology clinic as below: Future Appointments  Date Time Provider Department Center  12/16/2023  9:00 AM Dina Camie BRAVO, PA-C [facility] None  02/16/2024  8:20 AM Antonio Cyndee Jamee JONELLE, DO [facility]  05/09/2024  8:00 AM [facility] None  05/09/2024  8:40 AM Federico Norleen ONEIDA MADISON, MD [facility] None  11/15/2024  8:20 AM [facility] ANNUAL WELLNESS VISIT 1 [facility]

## 2023-12-12 NOTE — Patient Instructions (Addendum)
-   Start Tradjenta  5 mg, 1 tablet every morning - Continue Jardiance  25  mg , 1 tablet every morning  - Continue lantus  26 units daily       HOW TO TREAT LOW BLOOD SUGARS (Blood sugar LESS THAN 70 MG/DL) Please follow the RULE OF 15 for the treatment of hypoglycemia treatment (when your (blood sugars are less than 70 mg/dL)   STEP 1: Take 15 grams of carbohydrates when your blood sugar is low, which includes:  3-4 GLUCOSE TABS  OR 3-4 OZ OF JUICE OR REGULAR SODA OR ONE TUBE OF GLUCOSE GEL    STEP 2: RECHECK blood sugar in 15 MINUTES STEP 3: If your blood sugar is still low at the 15 minute recheck --> then, go back to STEP 1 and treat AGAIN with another 15 grams of carbohydrates.

## 2023-12-16 ENCOUNTER — Ambulatory Visit: Admitting: Physician Assistant

## 2023-12-16 ENCOUNTER — Encounter: Payer: Self-pay | Admitting: Physician Assistant

## 2023-12-16 VITALS — BP 119/61 | HR 69 | Resp 18 | Wt 129.0 lb

## 2023-12-16 DIAGNOSIS — G309 Alzheimer's disease, unspecified: Secondary | ICD-10-CM | POA: Diagnosis not present

## 2023-12-16 DIAGNOSIS — F0280 Dementia in other diseases classified elsewhere without behavioral disturbance: Secondary | ICD-10-CM

## 2023-12-16 MED ORDER — MEMANTINE HCL 5 MG PO TABS: ORAL_TABLET | ORAL | 3 refills | Status: AC

## 2023-12-16 NOTE — Progress Notes (Signed)
 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 Patient is currently on memantine  5 mg twice daily, tolerating well. MMSE today 21/20.  Discussed slowly increasing the medication to 15 mg daily, for broader coverage. He agrees. Patient is able to participate on ADLs and to drive without significant difficulties.  Mood is good.    Follow up in 6  months. Continue memantine  5 mg, increase to 10 mg at night, 5 mg in the morning ,side effects discussed (history of bradycardia)  Recommend good control of her cardiovascular risk factors. Monitor Hb A1C (>10).  Continue to control mood as per PCP      Subjective:    This patient is accompanied in the office by his son who supplements the history.  Previous records as well as any outside records available were reviewed prior to todays visit. Patient was last seen on 06/06/2023   Any changes in memory since last visit? About the same -son says. Denies any changes on his short-term memory, long-term memory is good.  [facility], remaining [facility]. Repeats oneself?  Endorsed, especially with appointments Disoriented when walking into a room? Denies .  Leaving objects?  May misplace things but not in unusual places   Wandering behavior?  denies   Any personality changes since last visit?  Denies.   Any worsening depression?:  Denies.   Hallucinations or paranoia?  Denies.   Seizures? denies    Any sleep changes? Sleeps well. Denies vivid dreams, REM behavior or  sleepwalking   Sleep apnea?   Denies.   Any hygiene concerns? Denies.  Independent of bathing and dressing?  Endorsed  Does the patient needs help with medications?  His other son Chyrl  is in charge   Who is in charge of the finances?  Son Lynwood is in charge     Any changes in appetite?  Denies. He eats out nightly.  He favors sweets, coke, etc.    Patient have trouble swallowing? Denies.   Does the patient cook? Not really Any headaches?   denies   Any vision changes?Denies.  He has a history of glaucoma, his ophthalmologist on a regular basis. Chronic back pain  denies   Ambulates with difficulty? Denies.Continues to run, bur pays close attention to the steps      Recent falls or head injuries?  I bumped my head with the bathroom door, no LOC. No falls.  Unilateral weakness, numbness or tingling? denies   Any tremors?  Denies    Any anosmia?  Denies   Any incontinence of urine?  Urge incontinence    Any bowel dysfunction?   Denies      Patient lives with his son  Does the patient drive?  Only short distances, such as going to [employer], friends homes, grocery store without getting lost      Neuropsych evaluation 03/01/2023 briefly, results suggested severe impairment surrounding all aspects of learning and memory. An additional impairment was exhibited across semantic fluency, while performance variability was exhibited across processing speed and executive functioning. Regarding the cause of  his mild dementia presentation, I do have concerns surrounding a neurodegenerative illness, namely Alzheimer's disease. Across memory testing, Mr. Mcclean did not benefit from repeated information across learning trials and was fully amnestic (i.e., 0% retention) across both verbal memory tasks after a brief delay. He only exhibited 13% retention across the remaining visual memory task. Performances across yes/no recognition trials were consistently poor. Taken together, this suggests evidence for  rapid forgetting and a pronounced storage impairment, both of which are the hallmark testing patterns associated with this illness. Further impairment surrounding semantic fluency and executive variability would represent typical disease progression. Intact confrontation naming is encouraging. However, concerns surrounding a dementia due to Alzheimer's disease presentation remain.     MRI of the brain personally reviewed was without acute findings, moderate volume loss, minimal chronic microvascular changes       Initial visit 08/31/2022 How long did patient have memory difficulties? For the last 2 year he was losing his wallet, having difficulty remembering recent conversations and people names . Since his fall in January, his memory worsened, forgetting within minutes, not knowing where the money is, date, remembering appointments and simple tasks. When I asked him Who is your MD, he opened the medicine cabinet to find out the name  repeats oneself?  Endorsed, for the last 1.5 years, worse recently  Disoriented when walking into a room?  Patient denies except occasionally not remembering what patient came to the room for    Leaving objects in unusual places?  he does misplace things.   Wandering behavior? denies   Any personality changes since last visit? denies   Any history of depression?:  denies   Hallucinations or paranoia?  denies   Seizures? denies    Any sleep changes?  Denies vivid dreams, REM behavior or sleepwalking . Takes Mg to help him relax Sleep apnea? denies   Any hygiene concerns?  denies   Independent of bathing and dressing?  Endorsed  Does the patient need help with medications?  Son is in charge because he was not taking medications correctly  Who is in charge of the finances?   Patient is in charge but sons are involved after some confusion with the bills    Any changes in appetite? It is ok  Eats in a restaurant several times a week . Seldom he cooks a  breakfast  Patient have trouble swallowing?  denies   Does the patient cook?seldom  Any kitchen accidents such as leaving the stove on? Patient denies   Any headaches?  denies   Chronic back pain?  denies   Ambulates with difficulty? 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  Endorsed, last February he fell while gardening, mechanical, with some residual right-sided low back pain, no loss of consciousness, no head injury. Vision changes? Denies, had corneal implants. Unilateral weakness, numbness or tingling?  denies   Any tremors?  denies   Any anosmia?  denies   Any incontinence of urine? For the last 5 months he has more urgency.  Any bowel dysfunction?    denies      Patient lives with his son   History of heavy alcohol  intake? denies   History of heavy tobacco use? denies   Family history of dementia?  No Does patient drive?  Short distances to [place], his friend home, grocery shop, but may have gotten lost going to the dentist    T MEDICATIONS:  Outpatient Encounter Medications as  of 12/16/2023  Medication Sig   ABRYSVO 120 MCG/0.5ML injection    acetaminophen  (TYLENOL ) 500 MG tablet Take 2 tablets (1,000 mg total) by mouth every 6 (six) hours as needed.   Alcohol  Swabs  PADS Use as directed once a day   alendronate  (FOSAMAX ) 70 MG tablet TAKE 1 TABLET ONCE WEEKLY *TAKE WITH A FULL GLASS OF WATER ON AN EMPTY STOMACH*   amLODipine  (NORVASC ) 5 MG tablet TAKE 1.5 TABLETS BY MOUTH DAILY.   aspirin 81 MG tablet Take 81 mg by mouth daily.   atorvastatin  (LIPITOR) 20 MG tablet TAKE 1 TABLET EVERY DAY   Blood Glucose Calibration (TRUE METRIX LEVEL 1) Low SOLN USE AS DIRECTED   Blood Glucose Calibration (TRUE METRIX LEVEL 3) High SOLN Use to check controls on glucometer strips every 30 days or with each new bottle of strips (whichever comes first).   Blood Glucose Monitoring Suppl (TRUE METRIX METER) w/Device KIT USE AS DIRECTED ONCE A DAY   BOOSTRIX  5-2.5-18.5 LF-MCG/0.5 injection    brimonidine (ALPHAGAN) 0.2 % ophthalmic solution SMARTSIG:In Eye(s)   brimonidine-timolol (COMBIGAN) 0.2-0.5 % ophthalmic solution Place 1 drop into both eyes 2 (two) times daily.   COLLAGEN PO Take by mouth. 1 scoop daily   dorzolamide-timolol (COSOPT) 2-0.5 % ophthalmic solution SMARTSIG:In Eye(s)   empagliflozin  (JARDIANCE ) 25 MG TABS tablet Take 1 tablet (25 mg total) by mouth daily before breakfast.   fish oil-omega-3 fatty acids 1000 MG capsule Take 2 g by mouth daily.   FLUZONE HIGH-DOSE 0.5 ML injection    glucose blood (TRUE METRIX BLOOD GLUCOSE TEST) test strip TEST BLOOD SUGAR ONE TIME DAILY   insulin  glargine (LANTUS  SOLOSTAR) 100 UNIT/ML Solostar Pen Inject 26 Units into the skin daily.   Insulin  Pen Needle 32G X 4 MM MISC 1 Device by Does not apply route daily in the afternoon.   latanoprost (XALATAN) 0.005 % ophthalmic solution SMARTSIG:In Eye(s)   linagliptin  (TRADJENTA ) 5 MG TABS tablet Take 1 tablet (5 mg total) by mouth daily.   losartan -hydrochlorothiazide (HYZAAR) 100-25 MG tablet TAKE 1 TABLET EVERY DAY   metoprolol  succinate (TOPROL -XL) 50 MG 24 hr tablet TAKE 1 TABLET EVERY DAY WITH OR IMMEDIATELY FOLLOWING A MEAL   Multiple Vitamin (MULTIVITAMIN) tablet Take 1 tablet by mouth daily.   SHINGRIX injection    SPIKEVAX syringe    tamsulosin  (FLOMAX ) 0.4 MG CAPS capsule TAKE 1 CAPSULE EVERY DAY AFTER SUPPER   TRUEplus Lancets 33G MISC Use to check blood sugar once a day.  DX  E11.9   vitamin B-12 (CYANOCOBALAMIN ) 100 MCG tablet Take 100 mcg by mouth daily.   [DISCONTINUED] memantine  (NAMENDA ) 5 MG tablet Take 1 tablet (5 mg total) by mouth 2 (two) times daily.   memantine  (NAMENDA ) 5 MG tablet Take 2 tabs at night and 1 tab in the morning   No facility-administered encounter medications on file as of 12/16/2023.       12/16/2023   11:00 AM  MMSE - Mini Mental State Exam  Orientation to time 3  Orientation to Place 3  Registration 3   Attention/ Calculation 4  Recall 0  Language- name 2 objects 2  Language- repeat 1  Language- follow 3 step command 3  Language- read & follow direction 1  Write a sentence 1  Copy design 0  Total score 21      08/31/2022   10:00 AM  Montreal Cognitive Assessment   Visuospatial/ Executive (0/5) 3  Naming (0/3) 3  Attention: Read  list of digits (0/2) 1  Attention: Read list of letters (0/1) 1  Attention: Serial 7 subtraction starting at 100 (0/3) 1  Language: Repeat phrase (0/2) 0  Language : Fluency (0/1) 0  Abstraction (0/2) 2  Delayed Recall (0/5) 0  Orientation (0/6) 5  Total 16  Adjusted Score (based on education) 16    Objective:     PHYSICAL EXAMINATION:    VITALS:   Vitals:   12/16/23 0906  BP: 119/61  Pulse: 69  Resp: 18  SpO2: 95%  Weight: 129 lb (58.5 kg)    GEN:  The patient appears stated age and is in NAD. HEENT:  Normocephalic, atraumatic.   Neurological examination:  General: NAD, well-groomed, appears stated age. Orientation: The patient is alert. Oriented to person, not to place and date Cranial nerves: There is good facial symmetry.The speech is fluent and clear. No aphasia or dysarthria. Fund of knowledge is appropriate. Recent and remote memory are impaired. Attention and concentration are reduced. Able to name objects and repeat phrases.  Hearing is intact to conversational tone.   Sensation: Sensation is intact to light touch throughout Motor: Strength is at least antigravity x4. DTR's 2/4 in UE/LE     Movement examination: Tone: There is normal tone in the UE/LE Abnormal movements:  no tremor.  No myoclonus.  No asterixis.   Coordination:  There is no decremation with RAM's. Normal finger to nose  Gait and Station: The patient has no difficulty arising out of a deep-seated chair without the use of the hands. The patient's stride length is good.  Gait is cautious and narrow.    Thank you for allowing us  the opportunity to  participate in the care of this nice patient. Please do not hesitate to contact us  for any questions or concerns.   Total time spent on today's visit was 25 minutes dedicated to this patient today, preparing to see patient, examining the patient, ordering tests and/or medications and counseling the patient, documenting clinical information in the EHR or other health record, independently interpreting results and communicating results to the patient/family, discussing treatment and goals, answering patient's questions and coordinating care.  Cc:  [name], Jamee SAUNDERS, DO  [facility] 12/16/2023 11:15 AM

## 2023-12-16 NOTE — Patient Instructions (Signed)
 It was a pleasure to see you today at our office.   Recommendations:  Follow up in 6 months Increase Memantine  to  5 mg  in the morning and 10 mg at night    Whom to call:  Memory  decline, memory medications: Call our office [phone]   For psychiatric meds, mood meds: Please have your primary care physician manage these medications.   Counseling regarding caregiver distress, including caregiver depression, anxiety and issues regarding community resources, adult day care programs, adult living facilities, or memory care questions:   Feel free to contact Misty Waddell Simmer, Social Worker at [phone]   For assessment of decision of mental capacity and competency:  Call Dr. Rosaline Nine, geriatric psychiatrist at [phone]  For guidance in geriatric dementia issues please call [facility] [phone]  For guidance regarding [facility] and if placement were needed at the facility, contact Nat Hock, Social Worker tel: [phone]  If you have any severe symptoms of a stroke, or other severe issues such as confusion,severe chills or fever, etc call 911 or go to the ER as you may need to be evaluated further       RECOMMENDATIONS FOR ALL PATIENTS WITH MEMORY PROBLEMS: 1. Continue to exercise (Recommend 30 minutes of walking everyday, or 3 hours every week) 2. Increase social interactions - continue going to [place] and enjoy social gatherings with friends and family 3. Eat healthy, avoid fried foods and eat more fruits and vegetables 4. Maintain adequate blood pressure, blood sugar, and blood cholesterol level. Reducing the risk of stroke and cardiovascular disease also helps promoting better memory. 5. Avoid stressful situations. Live a simple life and avoid aggravations. Organize your time and prepare for the next day in anticipation. 6. Sleep well, avoid any interruptions of sleep and avoid any distractions in the bedroom that may  interfere with adequate sleep quality 7. Avoid sugar, avoid sweets as there is a strong link between excessive sugar intake, diabetes, and cognitive impairment We discussed the Mediterranean diet, which has been shown to help patients reduce the risk of progressive memory disorders and reduces cardiovascular risk. This includes eating fish, eat fruits and green leafy vegetables, nuts like almonds and hazelnuts, walnuts, and also use olive oil. Avoid fast foods and fried foods as much as possible. Avoid sweets and sugar as sugar use has been linked to worsening of memory function.  There is always a concern of gradual progression of memory problems. If this is the case, then we may need to adjust level of care according to patient needs. Support, both to the patient and caregiver, should then be put into place.    FALL PRECAUTIONS: Be cautious when walking. Scan the area for obstacles that may increase the risk of trips and falls. When getting up in the mornings, sit up at the edge of the bed for a few minutes before getting out of bed. Consider elevating the bed at the head end to avoid drop of blood pressure when getting up. Walk always in a well-lit room (use night lights in the walls). Avoid area rugs or power cords from appliances in the middle of the walkways. Use a walker or a cane if necessary and consider physical therapy for balance exercise. Get your eyesight checked regularly.  FINANCIAL OVERSIGHT: Supervision, especially oversight when making financial decisions or transactions is also recommended.  HOME SAFETY: Consider the safety of the kitchen when operating appliances like stoves, microwave oven, and blender. Consider having  supervision and share cooking responsibilities until no longer able to participate in those. Accidents with firearms and other hazards in the house should be identified and addressed as well.   ABILITY TO BE LEFT ALONE: If patient is unable to contact 911 operator,  consider using LifeLine, or when the need is there, arrange for someone to stay with patients. Smoking is a fire hazard, consider supervision or cessation. Risk of wandering should be assessed by caregiver and if detected at any point, supervision and safe proof recommendations should be instituted.  MEDICATION SUPERVISION: Inability to self-administer medication needs to be constantly addressed. Implement a mechanism to ensure safe administration of the medications.   DRIVING: Regarding driving, in patients with progressive memory problems, driving will be impaired. We advise to have someone else do the driving if trouble finding directions or if minor accidents are reported. Independent driving assessment is available to determine safety of driving.   If you are interested in the driving assessment, you can contact the following:  [employer] in [place] [phone]  [facility] [phone]  [facility] [phone]  [facility] [phone] or [phone]

## 2023-12-19 DIAGNOSIS — H401112 Primary open-angle glaucoma, right eye, moderate stage: Secondary | ICD-10-CM | POA: Diagnosis not present

## 2023-12-19 DIAGNOSIS — H401121 Primary open-angle glaucoma, left eye, mild stage: Secondary | ICD-10-CM | POA: Diagnosis not present

## 2023-12-19 LAB — HM DIABETES EYE EXAM

## 2023-12-20 ENCOUNTER — Telehealth: Payer: Self-pay | Admitting: Pharmacy Technician

## 2023-12-20 NOTE — Telephone Encounter (Signed)
 Pharmacy Patient Advocate Encounter   Received notification from CoverMyMeds that prior authorization for MEMANTINE  5MG  is required/requested.   Insurance verification completed.   The patient is insured through [employer] .   Per test claim: PA required; PA submitted to above mentioned insurance via CoverMyMeds Key/confirmation #/EOC [id number] Status is pending

## 2023-12-21 ENCOUNTER — Other Ambulatory Visit (HOSPITAL_COMMUNITY): Payer: Self-pay

## 2023-12-21 ENCOUNTER — Other Ambulatory Visit: Payer: Self-pay | Admitting: Family Medicine

## 2023-12-21 DIAGNOSIS — I10 Essential (primary) hypertension: Secondary | ICD-10-CM

## 2023-12-21 NOTE — Telephone Encounter (Signed)
 Pharmacy Patient Advocate Encounter  Received notification from [employer] that Prior Authorization for MEMANTINE  5MG  has been APPROVED from 1.1.25 to 12.31.2025. Unable to obtain price due to refill too soon rejection, last fill date 7.16.25 next available fill date 9.29.25   PA #/Case ID/Reference #: [id number]

## 2024-01-19 ENCOUNTER — Ambulatory Visit: Admitting: Physician Assistant

## 2024-02-16 ENCOUNTER — Ambulatory Visit: Admitting: Family Medicine

## 2024-02-16 ENCOUNTER — Encounter: Payer: Self-pay | Admitting: Family Medicine

## 2024-02-16 VITALS — BP 118/50 | HR 84 | Temp 98.0°F | Ht 63.5 in | Wt 129.0 lb

## 2024-02-16 DIAGNOSIS — C61 Malignant neoplasm of prostate: Secondary | ICD-10-CM | POA: Diagnosis not present

## 2024-02-16 DIAGNOSIS — H401112 Primary open-angle glaucoma, right eye, moderate stage: Secondary | ICD-10-CM | POA: Diagnosis not present

## 2024-02-16 DIAGNOSIS — E1169 Type 2 diabetes mellitus with other specified complication: Secondary | ICD-10-CM

## 2024-02-16 DIAGNOSIS — I10 Essential (primary) hypertension: Secondary | ICD-10-CM

## 2024-02-16 DIAGNOSIS — Z23 Encounter for immunization: Secondary | ICD-10-CM

## 2024-02-16 DIAGNOSIS — E785 Hyperlipidemia, unspecified: Secondary | ICD-10-CM | POA: Diagnosis not present

## 2024-02-16 DIAGNOSIS — E1165 Type 2 diabetes mellitus with hyperglycemia: Secondary | ICD-10-CM

## 2024-02-16 DIAGNOSIS — F0280 Dementia in other diseases classified elsewhere without behavioral disturbance: Secondary | ICD-10-CM

## 2024-02-16 DIAGNOSIS — C9000 Multiple myeloma not having achieved remission: Secondary | ICD-10-CM

## 2024-02-16 DIAGNOSIS — Z794 Long term (current) use of insulin: Secondary | ICD-10-CM

## 2024-02-16 DIAGNOSIS — G309 Alzheimer's disease, unspecified: Secondary | ICD-10-CM | POA: Diagnosis not present

## 2024-02-16 DIAGNOSIS — Z7984 Long term (current) use of oral hypoglycemic drugs: Secondary | ICD-10-CM

## 2024-02-16 LAB — CBC WITH DIFFERENTIAL/PLATELET
Basophils Absolute: 0.1 K/uL (ref 0.0–0.1)
Basophils Relative: 0.7 % (ref 0.0–3.0)
Eosinophils Absolute: 0.1 K/uL (ref 0.0–0.7)
Eosinophils Relative: 1.3 % (ref 0.0–5.0)
HCT: 43.4 % (ref 39.0–52.0)
Hemoglobin: 14.4 g/dL (ref 13.0–17.0)
Lymphocytes Relative: 27.3 % (ref 12.0–46.0)
Lymphs Abs: 3 K/uL (ref 0.7–4.0)
MCHC: 33.2 g/dL (ref 30.0–36.0)
MCV: 89.3 fl (ref 78.0–100.0)
Monocytes Absolute: 0.9 K/uL (ref 0.1–1.0)
Monocytes Relative: 8.5 % (ref 3.0–12.0)
Neutro Abs: 6.9 K/uL (ref 1.4–7.7)
Neutrophils Relative %: 62.2 % (ref 43.0–77.0)
Platelets: 217 K/uL (ref 150.0–400.0)
RBC: 4.86 Mil/uL (ref 4.22–5.81)
RDW: 14.7 % (ref 11.5–15.5)
WBC: 11.1 K/uL — ABNORMAL HIGH (ref 4.0–10.5)

## 2024-02-16 LAB — MICROALBUMIN / CREATININE URINE RATIO
Creatinine,U: 84.2 mg/dL
Microalb Creat Ratio: 255 mg/g — ABNORMAL HIGH (ref 0.0–30.0)
Microalb, Ur: 21.5 mg/dL — ABNORMAL HIGH (ref 0.0–1.9)

## 2024-02-16 LAB — COMPREHENSIVE METABOLIC PANEL WITH GFR
ALT: 17 U/L (ref 0–53)
AST: 12 U/L (ref 0–37)
Albumin: 4.3 g/dL (ref 3.5–5.2)
Alkaline Phosphatase: 95 U/L (ref 39–117)
BUN: 25 mg/dL — ABNORMAL HIGH (ref 6–23)
CO2: 29 meq/L (ref 19–32)
Calcium: 9.5 mg/dL (ref 8.4–10.5)
Chloride: 103 meq/L (ref 96–112)
Creatinine, Ser: 1.69 mg/dL — ABNORMAL HIGH (ref 0.40–1.50)
GFR: 35.42 mL/min — ABNORMAL LOW (ref >60.00)
Glucose, Bld: 136 mg/dL — ABNORMAL HIGH (ref 70–99)
Potassium: 3.8 meq/L (ref 3.5–5.1)
Sodium: 143 meq/L (ref 135–145)
Total Bilirubin: 0.8 mg/dL (ref 0.2–1.2)
Total Protein: 7.1 g/dL (ref 6.0–8.3)

## 2024-02-16 LAB — LIPID PANEL
Cholesterol: 152 mg/dL (ref 0–200)
HDL: 41.7 mg/dL (ref >39.00)
LDL Cholesterol: 74 mg/dL (ref 0–99)
NonHDL: 109.97
Total CHOL/HDL Ratio: 4
Triglycerides: 181 mg/dL — ABNORMAL HIGH (ref 0.0–149.0)
VLDL: 36.2 mg/dL (ref 0.0–40.0)

## 2024-02-16 NOTE — Assessment & Plan Note (Signed)
 stable

## 2024-02-16 NOTE — Progress Notes (Signed)
 Subjective:    Patient ID: [name], male    DOB: 1932/06/06, 89 y.o.   MRN: [id number]  Chief Complaint  Patient presents with   Follow-up    Doing alright; no concerns    HPI Patient is in today for f/u bp, lipid, dm,  Discussed the use of AI scribe software for clinical note transcription with the patient, who gave verbal consent to proceed.  History of Present Illness [name] is a 89 year old male with diabetes who presents for a follow-up on blood sugar management.  He checks his blood sugar levels daily, but they remain sporadically high, with some readings reaching 300 mg/dL. His A1c was 11% three months ago and has decreased slightly but remains above target. He is currently on Lantus , 26 units daily, and Jardiance . His caregiver notes that his diet is generally good, though he visits [employer] a couple of times a day but claims not to consume sugar. The caregiver also mentions that his Lantus  dosage was increased after consulting with another doctor through MyChart.  He checks his blood sugar and blood pressure every morning. There is a concern that if his blood sugar levels do not improve, he may need to check his blood sugar at every meal. He is currently not doing this.  In terms of social history, he is active and participates in the senior Olympics. He goes to the gym frequently. 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  No significant changes in weight or hair loss, and he reports feeling good overall.    Past Medical History:  Diagnosis Date   Anemia, unspecified 10/27/2009   BPH with urinary obstruction 12/13/2012   CAD (coronary artery disease)    LHC 03/25/11 by Dr. Wonda:  pLAD 99%, oCFX 20-30%, pOM1 40%, dAVCFX 20-30%.  EF was normal on nuclear study.  He was treated with a Promus DES to his pLAD.    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 02/22/2023   Monoclonal gammopathy 04/06/2019   Multiple closed fractures of ribs of left side 03/21/2019   Multiple myeloma not having achieved remission 08/13/2022   Osteoporosis 03/21/2019   Peripheral vascular disorder due to diabetes mellitus 08/13/2022   Personal history of colonic polyps 1996   villous adenoma   Primary open angle glaucoma (POAG) of left eye, mild stage 02/04/2019   Primary open angle glaucoma (POAG) of right eye, moderate stage 02/04/2019   Prostate cancer 02/15/2017   Stage 3b chronic kidney disease 02/06/2020   Type II diabetes mellitus 07/04/2006   Vitamin D  deficiency 01/28/2021    Past Surgical History:  Procedure Laterality Date   CARDIAC CATHETERIZATION  03/25/2011   cataract Bilateral 2010   corenea implants  june and sept 2018   dr donnice [facility]   CORONARY STENT PLACEMENT  03/25/2011   RADIOACTIVE SEED IMPLANT N/A 05/06/2017   Procedure: RADIOACTIVE SEED IMPLANT/BRACHYTHERAPY IMPLANT;  Surgeon: Ottelin, Mark, MD;  Location: [facility] [place];  Service: Urology;  Laterality: N/A;   SPACE OAR INSTILLATION N/A 05/06/2017   Procedure: SPACE OAR INSTILLATION;  Surgeon: Ottelin, Mark, MD;  Location: [facility] [place];  Service: Urology;  Laterality: N/A;    Family History  Problem Relation Age of Onset   Stomach cancer Mother 85   Coronary artery disease Father 17       deceased   Healthy Son    Healthy Son    Healthy Son     Social History   Socioeconomic History   Marital status: Widowed    Spouse name: Not on file   Number of children: Not on file   Years of education: 14   Highest education level: Associate degree: academic program  Occupational History   Occupation:  Retired  Tobacco Use   Smoking status: Former    Current packs/day: 0.00    Average packs/day: 0.3 packs/day for 3.0 years (0.8 ttl pk-yrs)    Types: Cigarettes    Start date: 06/08/1955    Quit date: 06/07/1958    Years since quitting: 65.7   Smokeless tobacco: Never  Vaping Use   Vaping status: Never Used  Substance and Sexual Activity   Alcohol  use: Not Currently   Drug use: No   Sexual activity: Not Currently  Other Topics Concern   Not on file  Social History Narrative   Right   Lives with son    Retired   Two story home   Caffeine  2 cup a day   Still drives   Social Drivers of Health   Financial Resource Strain: Low Risk  (02/11/2024)   Overall Financial Resource Strain (CARDIA)    Difficulty of Paying Living Expenses: Not hard at all  Food Insecurity: No Food Insecurity (02/11/2024)   Hunger Vital Sign    Worried About Running Out of Food in the Last Year: Never true    Ran Out of Food in the Last Year: Never true  Transportation Needs: No Transportation Needs (02/11/2024)   PRAPARE - Administrator, Civil Service (Medical): No    Lack of Transportation (Non-Medical): No  Physical Activity: Insufficiently Active (02/11/2024)   Exercise Vital Sign    Days of Exercise per Week: 3 days    Minutes of Exercise per Session: 10 min  Stress: No Stress Concern Present (02/11/2024)   [employer] of Occupational Health - Occupational Stress Questionnaire    Feeling of Stress: Not at all  Social Connections: Moderately Integrated (02/11/2024)   Social Connection and Isolation Panel    Frequency of Communication with Friends and Family: More than three times a week    Frequency of Social Gatherings with Friends and Family: More than three times a week    Attends Religious Services: More than 4 times per year    Active Member of [employer] or Organizations: Yes    Attends Banker Meetings: More than 4 times per year    Marital Status: Widowed  Intimate Partner  Violence: Not At Risk (09/17/2022)   Humiliation, Afraid, Rape, and Kick questionnaire    Fear of Current or Ex-Partner: No    Emotionally Abused: No    Physically Abused: No    Sexually Abused: No    Outpatient Medications Prior to Visit  Medication Sig Dispense Refill   ABRYSVO 120 MCG/0.5ML injection      acetaminophen  (TYLENOL ) 500 MG tablet Take 2 tablets (1,000 mg total) by mouth every 6 (six) hours as needed. 60 tablet 0   Alcohol  Swabs  PADS Use as directed once a day 100 each 1   alendronate  (FOSAMAX ) 70 MG tablet TAKE 1 TABLET ONCE WEEKLY *TAKE WITH A  FULL GLASS OF WATER ON AN EMPTY STOMACH* 12 tablet 0   amLODipine  (NORVASC ) 5 MG tablet TAKE 1.5 TABLETS BY MOUTH DAILY. 135 tablet 1   aspirin 81 MG tablet Take 81 mg by mouth daily.     atorvastatin  (LIPITOR) 20 MG tablet TAKE 1 TABLET EVERY DAY 90 tablet 3   Blood Glucose Calibration (TRUE METRIX LEVEL 1) Low SOLN USE AS DIRECTED 1 each 3   Blood Glucose Calibration (TRUE METRIX LEVEL 3) High SOLN Use to check controls on glucometer strips every 30 days or with each new bottle of strips (whichever comes first). 3 each 1   Blood Glucose Monitoring Suppl (TRUE METRIX METER) w/Device KIT USE AS DIRECTED ONCE A DAY 1 kit 0   BOOSTRIX 5-2.5-18.5 LF-MCG/0.5 injection      brimonidine (ALPHAGAN) 0.2 % ophthalmic solution SMARTSIG:In Eye(s)     brimonidine-timolol (COMBIGAN) 0.2-0.5 % ophthalmic solution Place 1 drop into both eyes 2 (two) times daily.     COLLAGEN PO Take by mouth. 1 scoop daily     dorzolamide-timolol (COSOPT) 2-0.5 % ophthalmic solution SMARTSIG:In Eye(s)     empagliflozin  (JARDIANCE ) 25 MG TABS tablet Take 1 tablet (25 mg total) by mouth daily before breakfast. 90 tablet 1   fish oil-omega-3 fatty acids 1000 MG capsule Take 2 g by mouth daily.     FLUZONE HIGH-DOSE 0.5 ML injection      glucose blood (TRUE METRIX BLOOD GLUCOSE TEST) test strip TEST BLOOD SUGAR ONE TIME DAILY 100 strip 3   insulin  glargine (LANTUS   SOLOSTAR) 100 UNIT/ML Solostar Pen Inject 26 Units into the skin daily. 30 mL 4   Insulin  Pen Needle 32G X 4 MM MISC 1 Device by Does not apply route daily in the afternoon. 100 each 3   latanoprost (XALATAN) 0.005 % ophthalmic solution SMARTSIG:In Eye(s)     linagliptin  (TRADJENTA ) 5 MG TABS tablet Take 1 tablet (5 mg total) by mouth daily. 90 tablet 3   losartan -hydrochlorothiazide (HYZAAR) 100-25 MG tablet TAKE 1 TABLET EVERY DAY 90 tablet 3   memantine  (NAMENDA ) 5 MG tablet Take 2 tabs at night and 1 tab in the morning 270 tablet 3   metoprolol  succinate (TOPROL -XL) 50 MG 24 hr tablet Take 1 tablet (50 mg total) by mouth daily. Take with or immediately following a meal 90 tablet 1   Multiple Vitamin (MULTIVITAMIN) tablet Take 1 tablet by mouth daily.     SHINGRIX injection      SPIKEVAX syringe      tamsulosin  (FLOMAX ) 0.4 MG CAPS capsule Take 1 capsule (0.4 mg total) by mouth daily after supper. 90 capsule 1   TRUEplus Lancets 33G MISC Use to check blood sugar once a day.  DX  E11.9 100 each 1   vitamin B-12 (CYANOCOBALAMIN ) 100 MCG tablet Take 100 mcg by mouth daily.     No facility-administered medications prior to visit.    No Known Allergies  Review of Systems  Constitutional:  Negative for fever and malaise/fatigue.  HENT:  Negative for congestion.   Eyes:  Negative for blurred vision.  Respiratory:  Negative for shortness of breath.   Cardiovascular:  Negative for chest pain, palpitations and leg swelling.  Gastrointestinal:  Negative for abdominal pain, blood in stool and nausea.  Genitourinary:  Negative for dysuria and frequency.  Musculoskeletal:  Negative for falls.  Skin:  Negative for rash.  Neurological:  Negative for dizziness, loss of consciousness and headaches.  Endo/Heme/Allergies:  Negative for environmental allergies.  Psychiatric/Behavioral:  Negative for depression. The patient is not nervous/anxious.        Objective:    Physical Exam Vitals and  nursing note reviewed.  Constitutional:      General: He is not in acute distress.    Appearance: Normal appearance. He is well-developed.  HENT:     Head: Normocephalic and atraumatic.  Eyes:     General: No scleral icterus.       Right eye: No discharge.        Left eye: No discharge.  Cardiovascular:     Rate and Rhythm: Normal rate and regular rhythm.     Heart sounds: No murmur heard. Pulmonary:     Effort: Pulmonary effort is normal. No respiratory distress.     Breath sounds: Normal breath sounds.  Abdominal:     General: There are no signs of injury.  Musculoskeletal:        General: Normal range of motion.     Cervical back: Normal range of motion and neck supple.     Right lower leg: No edema.     Left lower leg: No edema.  Skin:    General: Skin is warm and dry.  Neurological:     General: No focal deficit present.     Mental Status: He is alert.     Motor: Motor function is intact.     Coordination: Coordination is intact.     Comments: Disoriented to person  Psychiatric:        Mood and Affect: Mood normal.        Behavior: Behavior normal.        Thought Content: Thought content normal.        Judgment: Judgment normal.     BP (!) 118/50   Pulse 84   Temp 98 F (36.7 C)   Ht 5' 3.5 (1.613 m)   Wt 129 lb (58.5 kg)   SpO2 96%   BMI 22.49 kg/m  Wt Readings from Last 3 Encounters:  02/16/24 129 lb (58.5 kg)  12/16/23 129 lb (58.5 kg)  12/12/23 129 lb (58.5 kg)    Diabetic Foot Exam - Simple   No data filed    Lab Results  Component Value Date   WBC 10.6 (H) 11/01/2023   HGB 13.6 11/01/2023   HCT 40.8 11/01/2023   PLT 241 11/01/2023   GLUCOSE 354 (H) 11/01/2023   CHOL 123 08/15/2023   TRIG 126.0 08/15/2023   HDL 35.90 (L) 08/15/2023   LDLCALC 62 08/15/2023   ALT 17 11/01/2023   AST 14 (L) 11/01/2023   NA 142 11/01/2023   K 3.9 11/01/2023   CL 102 11/01/2023   CREATININE 1.57 (H) 11/01/2023   BUN 28 (H) 11/01/2023   CO2 31  11/01/2023   TSH 3.58 08/13/2022   PSA 0.03 (L) 08/13/2022   INR 1.03 05/02/2017   HGBA1C 10.5 (A) 12/12/2023   MICROALBUR 43.9 (H) 10/18/2018    Lab Results  Component Value Date   TSH 3.58 08/13/2022   Lab Results  Component Value Date   WBC 10.6 (H) 11/01/2023   HGB 13.6 11/01/2023   HCT 40.8 11/01/2023   MCV 87.2 11/01/2023   PLT 241 11/01/2023   Lab Results  Component Value Date   NA 142 11/01/2023   K 3.9 11/01/2023   CO2 31 11/01/2023   GLUCOSE 354 (H) 11/01/2023   BUN 28 (H) 11/01/2023   CREATININE 1.57 (H) 11/01/2023   BILITOT 0.7 11/01/2023  ALKPHOS 98 11/01/2023   AST 14 (L) 11/01/2023   ALT 17 11/01/2023   PROT 7.1 11/01/2023   ALBUMIN 4.1 11/01/2023   CALCIUM  9.5 11/01/2023   ANIONGAP 9 11/01/2023   GFR 45.33 (L) 08/15/2023   Lab Results  Component Value Date   CHOL 123 08/15/2023   Lab Results  Component Value Date   HDL 35.90 (L) 08/15/2023   Lab Results  Component Value Date   LDLCALC 62 08/15/2023   Lab Results  Component Value Date   TRIG 126.0 08/15/2023   Lab Results  Component Value Date   CHOLHDL 3 08/15/2023   Lab Results  Component Value Date   HGBA1C 10.5 (A) 12/12/2023       Assessment & Plan:  Need for influenza vaccination -     Flu vaccine HIGH DOSE PF(Fluzone Trivalent)  Primary hypertension -     Lipid panel -     CBC with Differential/Platelet  Hyperlipidemia associated with type 2 diabetes mellitus (HCC) Assessment & Plan: Encourage heart healthy diet such as MIND or DASH diet, increase exercise, avoid trans fats, simple carbohydrates and processed foods, consider a krill or fish or flaxseed oil cap daily.    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 Encouraged heart healthy diet such as the DASH diet and exercise as tolerated.     Multiple myeloma not having achieved remission (HCC) Assessment & Plan: Per hematology   Primary open angle glaucoma (POAG) of right eye, moderate stage Assessment & Plan: Per opth   Assessment and Plan Assessment & Plan Type 2 diabetes mellitus with hyperglycemia   He experiences persistent hyperglycemia with sporadic high blood sugar levels despite the current treatment regimen. His A1c was 11% two months ago, indicating poor glycemic control. Although the Lantus  dosage has been increased, blood sugar levels continue to spike, sometimes reaching 300 mg/dL. The current regimen includes Lantus  and Jardiance , and he checks blood sugar only in the morning. No significant symptoms are reported, likely due to acclimatization to high blood sugar levels. Discuss the possibility of using a continuous glucose monitor for more frequent and accurate monitoring. Encourage keeping a food and drink diary to identify dietary causes of spikes. Continue current medications and consider more frequent blood sugar checks if hyperglycemia persists.  General Health Maintenance   He discussed receiving a flu shot and clarified that flu and pneumonia shots are typically covered by insurance without cost. He is in good general health, participates in senior activities, and maintains an active lifestyle. Administer flu shot today.    Welden Hausmann R [name], DO

## 2024-02-16 NOTE — Assessment & Plan Note (Signed)
 Well controlled, no changes to meds. Encouraged heart healthy diet such as the DASH diet and exercise as tolerated.

## 2024-02-16 NOTE — Assessment & Plan Note (Signed)
 Per u rology

## 2024-02-16 NOTE — Assessment & Plan Note (Signed)
Per opth 

## 2024-02-16 NOTE — Assessment & Plan Note (Signed)
 Encourage heart healthy diet such as MIND or DASH diet, increase exercise, avoid trans fats, simple carbohydrates and processed foods, consider a krill or fish or flaxseed oil cap daily.

## 2024-02-16 NOTE — Assessment & Plan Note (Signed)
 Per hematology

## 2024-02-22 ENCOUNTER — Ambulatory Visit: Payer: Self-pay | Admitting: Family Medicine

## 2024-02-26 ENCOUNTER — Other Ambulatory Visit: Payer: Self-pay | Admitting: Family Medicine

## 2024-02-26 DIAGNOSIS — I10 Essential (primary) hypertension: Secondary | ICD-10-CM

## 2024-02-27 ENCOUNTER — Other Ambulatory Visit: Payer: Self-pay

## 2024-02-27 DIAGNOSIS — R809 Proteinuria, unspecified: Secondary | ICD-10-CM

## 2024-03-07 ENCOUNTER — Other Ambulatory Visit: Payer: Self-pay | Admitting: Internal Medicine

## 2024-04-13 ENCOUNTER — Ambulatory Visit: Admitting: Internal Medicine

## 2024-04-13 ENCOUNTER — Encounter: Payer: Self-pay | Admitting: Internal Medicine

## 2024-04-13 VITALS — BP 130/82 | HR 63 | Ht 63.5 in | Wt 129.0 lb

## 2024-04-13 DIAGNOSIS — E861 Hypovolemia: Secondary | ICD-10-CM | POA: Diagnosis not present

## 2024-04-13 DIAGNOSIS — E1142 Type 2 diabetes mellitus with diabetic polyneuropathy: Secondary | ICD-10-CM | POA: Diagnosis not present

## 2024-04-13 DIAGNOSIS — E1122 Type 2 diabetes mellitus with diabetic chronic kidney disease: Secondary | ICD-10-CM

## 2024-04-13 DIAGNOSIS — Z794 Long term (current) use of insulin: Secondary | ICD-10-CM | POA: Diagnosis not present

## 2024-04-13 DIAGNOSIS — N1832 Chronic kidney disease, stage 3b: Secondary | ICD-10-CM

## 2024-04-13 DIAGNOSIS — E1165 Type 2 diabetes mellitus with hyperglycemia: Secondary | ICD-10-CM | POA: Diagnosis not present

## 2024-04-13 LAB — POCT GLYCOSYLATED HEMOGLOBIN (HGB A1C): Hemoglobin A1C: 10.6 % — AB (ref 4.0–5.6)

## 2024-04-13 MED ORDER — LANTUS SOLOSTAR 100 UNIT/ML ~~LOC~~ SOPN: 30.0000 [IU] | PEN_INJECTOR | Freq: Every day | SUBCUTANEOUS | 4 refills | Status: AC

## 2024-04-13 NOTE — Patient Instructions (Addendum)
-   Continue Tradjenta  5 mg, 1 tablet every morning - Continue Jardiance  25  mg , 1 tablet every morning  - Increase  lantus  30 units daily   Stop amlodipine , until you see Dr. Antonio   HOW TO TREAT LOW BLOOD SUGARS (Blood sugar LESS THAN 70 MG/DL) Please follow the RULE OF 15 for the treatment of hypoglycemia treatment (when your (blood sugars are less than 70 mg/dL)   STEP 1: Take 15 grams of carbohydrates when your blood sugar is low, which includes:  3-4 GLUCOSE TABS  OR 3-4 OZ OF JUICE OR REGULAR SODA OR ONE TUBE OF GLUCOSE GEL    STEP 2: RECHECK blood sugar in 15 MINUTES STEP 3: If your blood sugar is still low at the 15 minute recheck --> then, go back to STEP 1 and treat AGAIN with another 15 grams of carbohydrates.

## 2024-04-13 NOTE — Progress Notes (Signed)
 Name: [name]  Age/ Sex: 89 y.o., male   MRN/ DOB: [id number], 01/23/1932     PCP: [name], Jamee SAUNDERS, DO   Reason for Endocrinology Evaluation: Type 2 Diabetes Mellitus  Initial Endocrine Consultative Visit: 02/06/2020    PATIENT IDENTIFIER: [name] is a 89 y.o. male with a past medical history of T2DM, CAD and HTN. The patient has followed with Endocrinology clinic since 02/06/2020 for consultative assistance with management of his diabetes.  DIABETIC HISTORY:  [name] was diagnosed with DM in 2008, has been on Glimepiride  for years, janumet  started in 01/2020. His hemoglobin A1c has ranged from 6.7% in 2017, peaking at 9.6%in 2021.   On initial visit his A1c was 9.6 % , he was just started on Janumet  and we did not make any changes and continued Glimepiride .  Discontinued Janumet  and started Jardiance  only due to low GFR 08/2021    SUBJECTIVE:   During the last visit (08/12/2023): A1c 11.5%.     Today (04/13/2024): [name] is here for a follow up on diabetes management.  He is accompanied by his son Signe, who does not live with him, he was concerned this morning when he went to pick him up as he felt the patient was weak, and inability to walk on his own.   The patient did have a fall approximately 2 night s ago , he was not taken for evaluation. Signe hasn't  seen his father in 2.5 weeks ago   Patient lives with his son Chyrl, who has been managing his medications.   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 179 MGs/DL   860-654  mg/dL   DIABETIC COMPLICATIONS: Microvascular complications:  CKD III Denies: retinopathy, neuropathy Last Eye Exam: Completed 12/19/2023  Macrovascular complications:  CAD ( S/P stent) Denies:  CVA, PVD   HISTORY:  Past Medical History:  Past Medical History:  Diagnosis Date   Anemia, unspecified 10/27/2009   BPH with urinary obstruction 12/13/2012   CAD (coronary artery disease)    LHC 03/25/11 by Dr. Wonda:  pLAD 99%, oCFX 20-30%, pOM1 40%, dAVCFX 20-30%.  EF was normal on nuclear study.  He was treated with a Promus DES to his pLAD.    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/2023   Monoclonal gammopathy 04/06/2019   Multiple closed fractures of ribs of left side  03/21/2019   Multiple myeloma not having achieved remission 08/13/2022   Osteoporosis 03/21/2019   Peripheral vascular disorder due to diabetes mellitus 08/13/2022   Personal history of colonic polyps 1996   villous adenoma   Primary open angle glaucoma (POAG) of left eye, mild stage 02/04/2019   Primary open angle glaucoma (POAG) of right eye, moderate stage 02/04/2019   Prostate cancer 02/15/2017   Stage 3b chronic kidney disease 02/06/2020   Type II diabetes mellitus 07/04/2006   Vitamin D  deficiency 01/28/2021   Past Surgical History:  Past Surgical History:  Procedure Laterality Date   CARDIAC  CATHETERIZATION  03/25/2011   cataract Bilateral 2010   corenea implants  june and sept 2018   dr donnice [facility]   CORONARY STENT PLACEMENT  03/25/2011   RADIOACTIVE SEED IMPLANT N/A 05/06/2017   Procedure: RADIOACTIVE SEED IMPLANT/BRACHYTHERAPY IMPLANT;  Surgeon: Ottelin, Mark, MD;  Location: [facility];  Service: Urology;  Laterality: N/A;   SPACE OAR INSTILLATION N/A 05/06/2017   Procedure: SPACE OAR INSTILLATION;  Surgeon: Ottelin, Mark, MD;  Location[facility];  Service: Urology;  Laterality: N/A;   Social History:  reports that he quit smoking about 65 years ago. His smoking use included cigarettes. He started smoking about 68 years ago. He has a 0.8 pack-year smoking history. He has never used smokeless tobacco. He reports that he does not currently use alcohol . He reports that he does not use drugs. Family History:  Family History  Problem Relation Age of Onset   Stomach cancer Mother 68   Coronary artery disease Father 46       deceased   Healthy Son    Healthy Son    Healthy Son      HOME MEDICATIONS: Allergies as of 04/13/2024   No Known Allergies      Medication List        Accurate as of April 13, 2024  9:04 AM. If you have any questions, ask your nurse or doctor.          STOP taking these medications    amLODipine  5 MG tablet Commonly known as: NORVASC  Stopped by: [name] PARAS Tysen Roesler       TAKE these medications    Abrysvo 120 MCG/0.5ML injection Generic drug: RSV bivalent vaccine   acetaminophen  500 MG tablet Commonly known as: TYLENOL  Take 2 tablets (1,000 mg total) by mouth every 6 (six) hours as needed.   Alcohol  Swabs  Pads Use as directed once a day   alendronate  70 MG tablet Commonly known as: FOSAMAX  TAKE 1 TABLET ONCE WEEKLY *TAKE WITH A FULL GLASS OF WATER ON AN EMPTY STOMACH*   aspirin 81 MG tablet Take 81 mg by mouth daily.   atorvastatin  20 MG tablet Commonly known as: LIPITOR TAKE 1  TABLET EVERY DAY   Boostrix 5-2.5-18.5 LF-MCG/0.5 injection Generic drug: Tdap   brimonidine 0.2 % ophthalmic solution Commonly known as: ALPHAGAN SMARTSIG:In Eye(s)   brimonidine-timolol 0.2-0.5 % ophthalmic solution Commonly known as: COMBIGAN Place 1 drop into both eyes 2 (two) times daily.   COLLAGEN PO Take by mouth. 1 scoop daily   dorzolamide-timolol 2-0.5 % ophthalmic solution Commonly known as: COSOPT SMARTSIG:In Eye(s)   fish oil-omega-3 fatty acids 1000 MG capsule Take 2 g by mouth daily.   Fluzone High-Dose 0.5 ML injection Generic drug: Influenza vac split trivalent PF   Insulin  Pen Needle 32G X 4 MM Misc 1 Device by Does not apply route daily in the afternoon.  Jardiance  25 MG Tabs tablet Generic drug: empagliflozin  TAKE 1 TABLET EVERY DAY BEFORE BREAKFAST   Lantus  SoloStar 100 UNIT/ML Solostar Pen Generic drug: insulin  glargine Inject 30 Units into the skin daily. What changed: how much to take Changed by: Jakobi Thetford J Lequisha Cammack   latanoprost 0.005 % ophthalmic solution Commonly known as: XALATAN SMARTSIG:In Eye(s)   linagliptin  5 MG Tabs tablet Commonly known as: Tradjenta  Take 1 tablet (5 mg total) by mouth daily.   losartan -hydrochlorothiazide 100-25 MG tablet Commonly known as: HYZAAR TAKE 1 TABLET EVERY DAY   memantine  5 MG tablet Commonly known as: NAMENDA  Take 2 tabs at night and 1 tab in the morning   metoprolol  succinate 50 MG 24 hr tablet Commonly known as: TOPROL -XL Take 1 tablet (50 mg total) by mouth daily. Take with or immediately following a meal   multivitamin tablet Take 1 tablet by mouth daily.   Shingrix injection Generic drug: Zoster Vaccine Adjuvanted   Spikevax syringe Generic drug: COVID-19 mRNA vaccine   tamsulosin  0.4 MG Caps capsule Commonly known as: FLOMAX  Take 1 capsule (0.4 mg total) by mouth daily after supper.   True Metrix Blood Glucose Test test strip Generic drug: glucose blood TEST BLOOD SUGAR  ONE TIME DAILY   True Metrix Level 3 High Soln Use to check controls on glucometer strips every 30 days or with each new bottle of strips (whichever comes first).   True Metrix Level 1 Low Soln USE AS DIRECTED   True Metrix Meter w/Device Kit USE AS DIRECTED ONCE A DAY   TRUEplus Lancets 33G Misc Use to check blood sugar once a day.  DX  E11.9   vitamin B-12 100 MCG tablet Commonly known as: CYANOCOBALAMIN  Take 100 mcg by mouth daily.         OBJECTIVE:   Vital Signs: BP 130/82   Pulse 63   Ht 5' 3.5 (1.613 m)   Wt 129 lb (58.5 kg)   SpO2 99%   BMI 22.49 kg/m   Wt Readings from Last 3 Encounters:  04/13/24 129 lb (58.5 kg)  02/16/24 129 lb (58.5 kg)  12/16/23 129 lb (58.5 kg)     Exam: General: Pt appears well and is in NAD Left-sided scalp abrasion noted Left arm bruising noted  Lungs: Clear with good BS bilat   Heart: RRR   Extremities: No pretibial edema.  Neuro: MS is good with appropriate affect Power 5/5 at the upper and lower extremities   DM foot exam: 08/12/2023 The skin of the feet is without sores or ulcerations. The pedal pulses are 1+ on right and 1+ on left. The sensation is decreased  to a screening 5.07, 10 gram monofilament on the right          DATA REVIEWED:  Lab Results  Component Value Date   HGBA1C 10.6 (A) 04/13/2024   HGBA1C 10.5 (A) 12/12/2023   HGBA1C 11.5 (A) 08/12/2023    Latest Reference Range & Units 02/16/24 09:27  Sodium 135 - 145 mEq/L 143  Potassium 3.5 - 5.1 mEq/L 3.8  Chloride 96 - 112 mEq/L 103  CO2 19 - 32 mEq/L 29  Glucose 70 - 99 mg/dL 863 (H)  BUN 6 - 23 mg/dL 25 (H)  Creatinine 9.59 - 1.50 mg/dL 8.30 (H)  Calcium  8.4 - 10.5 mg/dL 9.5  Alkaline Phosphatase 39 - 117 U/L 95  Albumin 3.5 - 5.2 g/dL 4.3  AST 0 - 37 U/L 12  ALT 0 - 53 U/L 17  Total  Protein 6.0 - 8.3 g/dL 7.1  Total Bilirubin 0.2 - 1.2 mg/dL 0.8  GFR >39.99 mL/min 35.42 (L)     Latest Reference Range & Units 02/16/24 09:27  Total  CHOL/HDL Ratio  4  Cholesterol 0 - 200 mg/dL 847  HDL Cholesterol >60.99 mg/dL 58.29  LDL (calc) 0 - 99 mg/dL 74  MICROALB/CREAT RATIO 0.0 - 30.0 mg/g 255.0 (H)  NonHDL  109.97  Triglycerides 0.0 - 149.0 mg/dL 818.9 (H)  VLDL 0.0 - 59.9 mg/dL 63.7  (H): Data is abnormally high    ASSESSMENT / PLAN / RECOMMENDATIONS:   1) Type 2 Diabetes Mellitus, poorly controlled, with CKD III and neuropathic and macrovascular complications - Most recent A1c of 10.6 %. Goal A1c < 8.5 %.    -A1c has increased to 10.6% despite adding Tradjenta  on the last visit -He lives with his son Chyrl, who has been managing his medications due to the patient's dementia -Due to dementia and advanced age BG goal is to prevent  hypoglycemia and symptomatic/severe hyperglycemia -We switched Janumet  to Jardiance  due to a GFR of less than 45 - I suspect that most of his hyperglycemia is postprandial in nature, I will increase his basal insulin  as his fasting BGs remain elevated - I did contact the [facility] and they confirmed that the patient has been receiving Lantus  shipment in March, May, July, and as of recently 03/13/2024 - [facility] confirmed Jardiance  shipments on 10/19/2023, July and 03/13/2024 - Confirmation for Tradjenta  delivery was also confirmed 7/8 and 03/05/2024  MEDICATIONS:  Continue Tradjenta  5 mg daily Continue Jardiance  25 mg daily  Increase Lantus  30 units   EDUCATION / INSTRUCTIONS: BG monitoring instructions: Patient is instructed to check his blood sugars 1 times a day. Call [facility] if: BG persistently < 70  I reviewed the Rule of 15 for the treatment of hypoglycemia in detail with the patient. Literature supplied.    2) Diabetic complications:  Eye: Does not have known diabetic retinopathy.  Neuro/ Feet: Does have known diabetic peripheral neuropathy .  Renal: Patient does  have known baseline CKD. He   is  on an ACEI/ARB at present.     3)  Hypotension/unsteadiness:   - Pt has been noted with low BP at home and I suspect that may be the reason for his unsteady gait - Neurologically there was no deficit on exam today - I will discontinue amlodipine , son/patient was encouraged to follow-up with PCP as soon as possible - We also discussed options to consider such as home health, as the patient stays home by himself and he did sustain a fall a couple days ago, patient with mild dementia - Encouraged hydration  Medication Stop amlodipine  5 mg daily   F/U in 4 months    Signed electronically by: Stefano Redgie Butts, MD  [facility] Endocr[facility]ology  [facility] [address] [place], [place] [address] Phone: [phone] FAX: [phone]   CC: Antonio Cyndee Jamee JONELLE, DO [address] [place] [address] Phone: [phone]  Fax: [phone]  Return to Endocrinology clinic as below: Future Appointments  Date Time Provider Department Center  05/09/2024  8:00 AM [facility] None  05/09/2024  8:40 AM Federico Norleen ONEIDA MADISON, MD [facility] None  06/14/2024  9:00 AM Dina Camie BRAVO, PA-C [facility] None  08/16/2024  8:40 AM Antonio Cyndee Jamee JONELLE, DO [facility] [address]  11/15/2024  8:20 AM [facility] ANNUAL  WELLNESS VISIT 1 [facility] [address]

## 2024-04-17 ENCOUNTER — Encounter: Payer: Self-pay | Admitting: Family Medicine

## 2024-04-17 ENCOUNTER — Ambulatory Visit (INDEPENDENT_AMBULATORY_CARE_PROVIDER_SITE_OTHER): Admitting: Family Medicine

## 2024-04-17 VITALS — BP 128/78 | HR 61 | Temp 97.8°F | Resp 16 | Ht 63.5 in | Wt 132.6 lb

## 2024-04-17 DIAGNOSIS — R2681 Unsteadiness on feet: Secondary | ICD-10-CM

## 2024-04-17 DIAGNOSIS — R42 Dizziness and giddiness: Secondary | ICD-10-CM | POA: Diagnosis not present

## 2024-04-17 NOTE — Progress Notes (Signed)
 Subjective:    Patient ID: [name], male    DOB: 29-Dec-1931, 89 y.o.   MRN: [id number]  Chief Complaint  Patient presents with   Fall    Pt states having a fall last Tuesday and fell down the last several steps. Pt states having some low bps prior to fall. Pt did have an episode of dizziness before a Endo appt and Dr. Erna advise pt to stop taking amlodipine .     HPI Patient is in today for f/u fall.  Discussed the use of AI scribe software for clinical note transcription with the patient, who gave verbal consent to proceed.  History of Present Illness [name] is a 89 year old male who presents with dizziness and a recent fall.  He experienced a fall at 2 AM on Wednesday while descending the last few stairs, resulting in bruising on his arm and a bump on his head. He felt lightheaded and leaned to one side while walking. His son, Chyrl, heard the fall and found him immediately. He did not lose consciousness but was slightly disoriented. He has been improving daily and feels close to normal now.  Prior to the fall, he had low blood pressure readings, which led to the discontinuation of amlodipine . He experiences lightheadedness upon waking and sits on the bed for about ten minutes. No headaches or dizziness at present. His weight has remained stable, with a slight increase recently.  He has a history of diabetes, with an A1c of 10.5. His blood sugar readings have been variable, with recent values ranging from 107 to 300. No episodes of hypoglycemia. He reports that his balance has improved, and he can walk unassisted, although he has been shuffling more than usual.  No issues with urination and his bladder empties completely. He does not sleepwalk and typically sleeps through the night. He has not been running recently due to the cold weather but attends a gym regularly.    Past Medical History:  Diagnosis Date   Anemia, unspecified 10/27/2009   BPH with urinary obstruction  12/13/2012   CAD (coronary artery disease)    LHC 03/25/11 by Dr. Wonda:  pLAD 99%, oCFX 20-30%, pOM1 40%, dAVCFX 20-30%.  EF was normal on nuclear study.  He was treated with a Promus DES to his pLAD.    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/2023   Monoclonal gammopathy 04/06/2019   Multiple closed fractures of ribs of left side 03/21/2019   Multiple myeloma not having achieved remission 08/13/2022   Osteoporosis 03/21/2019   Peripheral vascular disorder due to diabetes mellitus 08/13/2022   Personal history of colonic polyps 1996   villous adenoma   Primary open angle glaucoma (POAG) of left eye, mild stage 02/04/2019   Primary open angle glaucoma (POAG) of right eye, moderate stage 02/04/2019   Prostate cancer 02/15/2017   Stage 3b chronic kidney disease 02/06/2020   Type II diabetes mellitus 07/04/2006   Vitamin D  deficiency 01/28/2021    Past Surgical History:  Procedure Laterality Date   CARDIAC CATHETERIZATION  03/25/2011   cataract Bilateral 2010   corenea implants  june and sept 2018   dr donnice [facility]   CORONARY  STENT PLACEMENT  03/25/2011   RADIOACTIVE SEED IMPLANT N/A 05/06/2017   Procedure: RADIOACTIVE SEED IMPLANT/BRACHYTHERAPY IMPLANT;  Surgeon: Ottelin, Mark, MD;  Location: [facility];  Service: Urology;  Laterality: N/A;   SPACE OAR INSTILLATION N/A 05/06/2017   Procedure: SPACE OAR INSTILLATION;  Surgeon: Ottelin, Mark, MD;  Location: [facility];  Service: Urology;  Laterality: N/A;    Family History  Problem Relation Age of Onset   Stomach cancer Mother 31   Coronary artery disease Father 47        deceased   Healthy Son    Healthy Son    Healthy Son     Social History   Socioeconomic History   Marital status: Widowed    Spouse name: Not on file   Number of children: Not on file   Years of education: 14   Highest education level: Associate degree: academic program  Occupational History   Occupation: Retired  Tobacco Use   Smoking status: Former    Current packs/day: 0.00    Average packs/day: 0.3 packs/day for 3.0 years (0.8 ttl pk-yrs)    Types: Cigarettes    Start date: 06/08/1955    Quit date: 06/07/1958    Years since quitting: 65.9   Smokeless tobacco: Never  Vaping Use   Vaping status: Never Used  Substance and Sexual Activity   Alcohol  use: Not Currently   Drug use: No   Sexual activity: Not Currently  Other Topics Concern   Not on file  Social History Narrative   Right   Lives with son    Retired   Two story home   Caffeine  2 cup a day   Still drives   Social Drivers of Health   Financial Resource Strain: Low Risk  (04/16/2024)   Overall Financial Resource Strain (CARDIA)    Difficulty of Paying Living Expenses: Not hard at all  Food Insecurity: No Food Insecurity (04/16/2024)   Hunger Vital Sign    Worried About Running Out of Food in the Last Year: Never true    Ran Out of Food in the Last Year: Never true  Transportation Needs: No Transportation Needs (04/16/2024)   PRAPARE - Administrator, Civil Service (Medical): No    Lack of Transportation (Non-Medical): No  Physical Activity: Insufficiently Active (04/16/2024)   Exercise Vital Sign    Days of Exercise per Week: 2 days    Minutes of Exercise per Session: 20 min  Stress: No Stress Concern Present (04/16/2024)   [employer] of Occupational Health - Occupational Stress Questionnaire    Feeling of Stress: Not at all  Social Connections: Moderately Integrated (04/16/2024)   Social Connection and Isolation Panel    Frequency of Communication with Friends and Family: More  than three times a week    Frequency of Social Gatherings with Friends and Family: More than three times a week    Attends Religious Services: More than 4 times per year    Active Member of [employer] or Organizations: Yes    Attends Banker Meetings: More than 4 times per year    Marital Status: Widowed  Intimate Partner Violence: Not At Risk (09/17/2022)   Humiliation, Afraid, Rape, and Kick questionnaire    Fear of Current or Ex-Partner: No    Emotionally Abused: No    Physically Abused: No    Sexually Abused: No    Outpatient Medications Prior to Visit  Medication Sig Dispense Refill  acetaminophen  (TYLENOL ) 500 MG tablet Take 2 tablets (1,000 mg total) by mouth every 6 (six) hours as needed. 60 tablet 0   Alcohol  Swabs  PADS Use as directed once a day 100 each 1   alendronate  (FOSAMAX ) 70 MG tablet TAKE 1 TABLET ONCE WEEKLY *TAKE WITH A FULL GLASS OF WATER ON AN EMPTY STOMACH* 12 tablet 0   aspirin 81 MG tablet Take 81 mg by mouth daily.     atorvastatin  (LIPITOR) 20 MG tablet TAKE 1 TABLET EVERY DAY 90 tablet 3   Blood Glucose Calibration (TRUE METRIX LEVEL 1) Low SOLN USE AS DIRECTED 1 each 3   Blood Glucose Calibration (TRUE METRIX LEVEL 3) High SOLN Use to check controls on glucometer strips every 30 days or with each new bottle of strips (whichever comes first). 3 each 1   Blood Glucose Monitoring Suppl (TRUE METRIX METER) w/Device KIT USE AS DIRECTED ONCE A DAY 1 kit 0   brimonidine (ALPHAGAN) 0.2 % ophthalmic solution SMARTSIG:In Eye(s)     brimonidine-timolol (COMBIGAN) 0.2-0.5 % ophthalmic solution Place 1 drop into both eyes 2 (two) times daily.     COLLAGEN PO Take by mouth. 1 scoop daily     dorzolamide-timolol (COSOPT) 2-0.5 % ophthalmic solution SMARTSIG:In Eye(s)     empagliflozin  (JARDIANCE ) 25 MG TABS tablet TAKE 1 TABLET EVERY DAY BEFORE BREAKFAST 90 tablet 1   fish oil-omega-3 fatty acids 1000 MG capsule Take 2 g by mouth daily.     glucose blood (TRUE  METRIX BLOOD GLUCOSE TEST) test strip TEST BLOOD SUGAR ONE TIME DAILY 100 strip 3   insulin  glargine (LANTUS  SOLOSTAR) 100 UNIT/ML Solostar Pen Inject 30 Units into the skin daily. 30 mL 4   Insulin  Pen Needle 32G X 4 MM MISC 1 Device by Does not apply route daily in the afternoon. 100 each 3   latanoprost (XALATAN) 0.005 % ophthalmic solution SMARTSIG:In Eye(s)     linagliptin  (TRADJENTA ) 5 MG TABS tablet Take 1 tablet (5 mg total) by mouth daily. 90 tablet 3   losartan -hydrochlorothiazide (HYZAAR) 100-25 MG tablet TAKE 1 TABLET EVERY DAY 90 tablet 3   memantine  (NAMENDA ) 5 MG tablet Take 2 tabs at night and 1 tab in the morning 270 tablet 3   metoprolol  succinate (TOPROL -XL) 50 MG 24 hr tablet Take 1 tablet (50 mg total) by mouth daily. Take with or immediately following a meal 90 tablet 1   Multiple Vitamin (MULTIVITAMIN) tablet Take 1 tablet by mouth daily.     tamsulosin  (FLOMAX ) 0.4 MG CAPS capsule Take 1 capsule (0.4 mg total) by mouth daily after supper. 90 capsule 1   TRUEplus Lancets 33G MISC Use to check blood sugar once a day.  DX  E11.9 100 each 1   vitamin B-12 (CYANOCOBALAMIN ) 100 MCG tablet Take 100 mcg by mouth daily.     ABRYSVO 120 MCG/0.5ML injection      BOOSTRIX 5-2.5-18.5 LF-MCG/0.5 injection      FLUZONE HIGH-DOSE 0.5 ML injection      SHINGRIX injection      SPIKEVAX syringe      No facility-administered medications prior to visit.    No Known Allergies  Review of Systems  Constitutional:  Negative for fever and malaise/fatigue.  HENT:  Negative for congestion.   Eyes:  Negative for blurred vision.  Respiratory:  Negative for cough and shortness of breath.   Cardiovascular:  Negative for chest pain, palpitations and leg swelling.  Gastrointestinal:  Negative for abdominal pain, blood  in stool, nausea and vomiting.  Genitourinary:  Negative for dysuria and frequency.  Musculoskeletal:  Negative for back pain and falls.  Skin:  Negative for rash.  Neurological:   Negative for dizziness, loss of consciousness and headaches.  Endo/Heme/Allergies:  Negative for environmental allergies.  Psychiatric/Behavioral:  Negative for depression. The patient is not nervous/anxious.        Objective:    Physical Exam Vitals and nursing note reviewed.  Constitutional:      General: He is not in acute distress.    Appearance: Normal appearance. He is well-developed.  HENT:     Head: Normocephalic. Contusion present.   Eyes:     General: No scleral icterus.       Right eye: No discharge.        Left eye: No discharge.  Cardiovascular:     Rate and Rhythm: Normal rate and regular rhythm.     Heart sounds: No murmur heard. Pulmonary:     Effort: Pulmonary effort is normal. No respiratory distress.     Breath sounds: Normal breath sounds.  Musculoskeletal:        General: Normal range of motion.     Cervical back: Normal range of motion and neck supple.     Right lower leg: No edema.     Left lower leg: No edema.  Skin:    General: Skin is warm and dry.  Neurological:     General: No focal deficit present.     Mental Status: He is alert and oriented to person, place, and time.     Motor: No weakness.     Deep Tendon Reflexes: Reflexes are normal and symmetric.  Psychiatric:        Mood and Affect: Mood normal.        Behavior: Behavior normal.        Thought Content: Thought content normal.        Judgment: Judgment normal.     BP 128/78 (BP Location: Right Arm, Patient Position: Sitting, Cuff Size: Normal)   Pulse 61   Temp 97.8 F (36.6 C) (Oral)   Resp 16   Ht 5' 3.5 (1.613 m)   Wt 132 lb 9.6 oz (60.1 kg)   SpO2 97%   BMI 23.12 kg/m  Wt Readings from Last 3 Encounters:  04/17/24 132 lb 9.6 oz (60.1 kg)  04/13/24 129 lb (58.5 kg)  02/16/24 129 lb (58.5 kg)    Diabetic Foot Exam - Simple   No data filed    Lab Results  Component Value Date   WBC 11.1 (H) 02/16/2024   HGB 14.4 02/16/2024   HCT 43.4 02/16/2024   PLT 217.0  02/16/2024   GLUCOSE 136 (H) 02/16/2024   CHOL 152 02/16/2024   TRIG 181.0 (H) 02/16/2024   HDL 41.70 02/16/2024   LDLCALC 74 02/16/2024   ALT 17 02/16/2024   AST 12 02/16/2024   NA 143 02/16/2024   K 3.8 02/16/2024   CL 103 02/16/2024   CREATININE 1.69 (H) 02/16/2024   BUN 25 (H) 02/16/2024   CO2 29 02/16/2024   TSH 3.58 08/13/2022   PSA 0.03 (L) 08/13/2022   INR 1.03 05/02/2017   HGBA1C 10.6 (A) 04/13/2024   MICROALBUR 21.5 (H) 02/16/2024    Lab Results  Component Value Date   TSH 3.58 08/13/2022   Lab Results  Component Value Date   WBC 11.1 (H) 02/16/2024   HGB 14.4 02/16/2024   HCT 43.4 02/16/2024   MCV  89.3 02/16/2024   PLT 217.0 02/16/2024   Lab Results  Component Value Date   NA 143 02/16/2024   K 3.8 02/16/2024   CO2 29 02/16/2024   GLUCOSE 136 (H) 02/16/2024   BUN 25 (H) 02/16/2024   CREATININE 1.69 (H) 02/16/2024   BILITOT 0.8 02/16/2024   ALKPHOS 95 02/16/2024   AST 12 02/16/2024   ALT 17 02/16/2024   PROT 7.1 02/16/2024   ALBUMIN 4.3 02/16/2024   CALCIUM  9.5 02/16/2024   ANIONGAP 9 11/01/2023   GFR 35.42 (L) 02/16/2024   Lab Results  Component Value Date   CHOL 152 02/16/2024   Lab Results  Component Value Date   HDL 41.70 02/16/2024   Lab Results  Component Value Date   LDLCALC 74 02/16/2024   Lab Results  Component Value Date   TRIG 181.0 (H) 02/16/2024   Lab Results  Component Value Date   CHOLHDL 4 02/16/2024   Lab Results  Component Value Date   HGBA1C 10.6 (A) 04/13/2024       Assessment & Plan:  Dizziness -     CBC with Differential/Platelet -     Comprehensive metabolic panel with GFR -     Vitamin B12  Unsteady gait -     CBC with Differential/Platelet -     Comprehensive metabolic panel with GFR -     Vitamin B12    Stoney Karczewski R [name], DO

## 2024-04-18 DIAGNOSIS — N1832 Chronic kidney disease, stage 3b: Secondary | ICD-10-CM | POA: Diagnosis not present

## 2024-04-18 DIAGNOSIS — I129 Hypertensive chronic kidney disease with stage 1 through stage 4 chronic kidney disease, or unspecified chronic kidney disease: Secondary | ICD-10-CM | POA: Diagnosis not present

## 2024-04-18 DIAGNOSIS — C9000 Multiple myeloma not having achieved remission: Secondary | ICD-10-CM | POA: Diagnosis not present

## 2024-04-18 DIAGNOSIS — E1122 Type 2 diabetes mellitus with diabetic chronic kidney disease: Secondary | ICD-10-CM | POA: Diagnosis not present

## 2024-04-18 DIAGNOSIS — N400 Enlarged prostate without lower urinary tract symptoms: Secondary | ICD-10-CM | POA: Diagnosis not present

## 2024-04-18 LAB — CBC WITH DIFFERENTIAL/PLATELET
Basophils Absolute: 0.1 K/uL (ref 0.0–0.1)
Basophils Relative: 1.1 % (ref 0.0–3.0)
Eosinophils Absolute: 0.2 K/uL (ref 0.0–0.7)
Eosinophils Relative: 1.6 % (ref 0.0–5.0)
HCT: 43.1 % (ref 39.0–52.0)
Hemoglobin: 14.4 g/dL (ref 13.0–17.0)
Lymphocytes Relative: 25.5 % (ref 12.0–46.0)
Lymphs Abs: 2.9 K/uL (ref 0.7–4.0)
MCHC: 33.3 g/dL (ref 30.0–36.0)
MCV: 89 fl (ref 78.0–100.0)
Monocytes Absolute: 1.1 K/uL — ABNORMAL HIGH (ref 0.1–1.0)
Monocytes Relative: 9.4 % (ref 3.0–12.0)
Neutro Abs: 7.1 K/uL (ref 1.4–7.7)
Neutrophils Relative %: 62.4 % (ref 43.0–77.0)
Platelets: 227 K/uL (ref 150.0–400.0)
RBC: 4.84 Mil/uL (ref 4.22–5.81)
RDW: 14.2 % (ref 11.5–15.5)
WBC: 11.4 K/uL — ABNORMAL HIGH (ref 4.0–10.5)

## 2024-04-18 LAB — COMPREHENSIVE METABOLIC PANEL WITH GFR
ALT: 12 U/L (ref 0–53)
AST: 13 U/L (ref 0–37)
Albumin: 4.1 g/dL (ref 3.5–5.2)
Alkaline Phosphatase: 103 U/L (ref 39–117)
BUN: 32 mg/dL — ABNORMAL HIGH (ref 6–23)
CO2: 32 meq/L (ref 19–32)
Calcium: 9.8 mg/dL (ref 8.4–10.5)
Chloride: 98 meq/L (ref 96–112)
Creatinine, Ser: 1.54 mg/dL — ABNORMAL HIGH (ref 0.40–1.50)
GFR: 39.55 mL/min — ABNORMAL LOW (ref >60.00)
Glucose, Bld: 162 mg/dL — ABNORMAL HIGH (ref 70–99)
Potassium: 3.9 meq/L (ref 3.5–5.1)
Sodium: 140 meq/L (ref 135–145)
Total Bilirubin: 0.8 mg/dL (ref 0.2–1.2)
Total Protein: 6.9 g/dL (ref 6.0–8.3)

## 2024-04-18 LAB — VITAMIN B12: Vitamin B-12: >1500 pg/mL — ABNORMAL HIGH (ref 211–911)

## 2024-04-26 ENCOUNTER — Ambulatory Visit: Payer: Self-pay | Admitting: Family Medicine

## 2024-05-02 DIAGNOSIS — H401113 Primary open-angle glaucoma, right eye, severe stage: Secondary | ICD-10-CM | POA: Diagnosis not present

## 2024-05-02 DIAGNOSIS — H401121 Primary open-angle glaucoma, left eye, mild stage: Secondary | ICD-10-CM | POA: Diagnosis not present

## 2024-05-09 ENCOUNTER — Other Ambulatory Visit: Payer: Self-pay | Admitting: Hematology and Oncology

## 2024-05-09 ENCOUNTER — Inpatient Hospital Stay: Attending: Hematology and Oncology

## 2024-05-09 ENCOUNTER — Inpatient Hospital Stay: Admitting: Hematology and Oncology

## 2024-05-09 VITALS — BP 132/68 | HR 77 | Temp 97.2°F | Resp 14 | Wt 129.8 lb

## 2024-05-09 DIAGNOSIS — D472 Monoclonal gammopathy: Secondary | ICD-10-CM

## 2024-05-09 DIAGNOSIS — Z8546 Personal history of malignant neoplasm of prostate: Secondary | ICD-10-CM | POA: Insufficient documentation

## 2024-05-09 DIAGNOSIS — D649 Anemia, unspecified: Secondary | ICD-10-CM | POA: Diagnosis not present

## 2024-05-09 DIAGNOSIS — C61 Malignant neoplasm of prostate: Secondary | ICD-10-CM | POA: Diagnosis not present

## 2024-05-09 LAB — CBC WITH DIFFERENTIAL (CANCER CENTER ONLY)
Abs Immature Granulocytes: 0.03 K/uL (ref 0.00–0.07)
Basophils Absolute: 0.1 K/uL (ref 0.0–0.1)
Basophils Relative: 1 %
Eosinophils Absolute: 0.2 K/uL (ref 0.0–0.5)
Eosinophils Relative: 2 %
HCT: 41.7 % (ref 39.0–52.0)
Hemoglobin: 14.2 g/dL (ref 13.0–17.0)
Immature Granulocytes: 0 %
Lymphocytes Relative: 26 %
Lymphs Abs: 3.2 K/uL (ref 0.7–4.0)
MCH: 29.5 pg (ref 26.0–34.0)
MCHC: 34.1 g/dL (ref 30.0–36.0)
MCV: 86.5 fL (ref 80.0–100.0)
Monocytes Absolute: 1.1 K/uL — ABNORMAL HIGH (ref 0.1–1.0)
Monocytes Relative: 10 %
Neutro Abs: 7.3 K/uL (ref 1.7–7.7)
Neutrophils Relative %: 61 %
Platelet Count: 222 K/uL (ref 150–400)
RBC: 4.82 MIL/uL (ref 4.22–5.81)
RDW: 13.5 % (ref 11.5–15.5)
WBC Count: 11.9 K/uL — ABNORMAL HIGH (ref 4.0–10.5)
nRBC: 0 % (ref 0.0–0.2)

## 2024-05-09 LAB — CMP (CANCER CENTER ONLY)
ALT: 14 U/L (ref 0–44)
AST: 14 U/L — ABNORMAL LOW (ref 15–41)
Albumin: 4.2 g/dL (ref 3.5–5.0)
Alkaline Phosphatase: 131 U/L — ABNORMAL HIGH (ref 38–126)
Anion gap: 10 (ref 5–15)
BUN: 33 mg/dL — ABNORMAL HIGH (ref 8–23)
CO2: 29 mmol/L (ref 22–32)
Calcium: 9.4 mg/dL (ref 8.9–10.3)
Chloride: 101 mmol/L (ref 98–111)
Creatinine: 1.63 mg/dL — ABNORMAL HIGH (ref 0.61–1.24)
GFR, Estimated: 40 mL/min — ABNORMAL LOW (ref >60)
Glucose, Bld: 249 mg/dL — ABNORMAL HIGH (ref 70–99)
Potassium: 3.7 mmol/L (ref 3.5–5.1)
Sodium: 141 mmol/L (ref 135–145)
Total Bilirubin: 0.6 mg/dL (ref 0.0–1.2)
Total Protein: 7.1 g/dL (ref 6.5–8.1)

## 2024-05-09 LAB — LACTATE DEHYDROGENASE: LDH: 162 U/L (ref 105–235)

## 2024-05-09 NOTE — Progress Notes (Signed)
 [facility] [phone]   [phone]  PROGRESS NOTE  Patient Care Team: [name], Jamee SAUNDERS, DO as PCP - General (Family Medicine) Ottelin, Mark, MD (Inactive) as Consulting Physician (Urology) Patrcia Cough, MD as Consulting Physician (Radiation Oncology) Caresse Cough, MD as Referring Physician (Ophthalmology) Federico Norleen ONEIDA MADISON, MD as Consulting Physician (Hematology and Oncology) [facility], [name] [name], MD as Consulting Physician (Endocrinology) Elisabeth Valli BIRCH, MD as Consulting Physician (Urology) Gearline Norris, MD as Consulting Physician (Nephrology) Carla Milling, RPH-CPP (Pharmacist) [name], [name], PA-C (Neurology) Bond, Reyes Mcardle, MD as Referring Physician (Ophthalmology)  Hematological/Oncological History #Smoldering Multiple Myeloma, Intermediate Risk 1) 02/15/19: found to have ambda chains 645.5, kappa 27.9 with a ratio of 0.04, no serum M protein during nephrology work up.  2) 04/06/19: establish care with Dr. Federico 3) 04/06/19: SPEP shows no serum monoclonal protein, however high levels of M protein detected in the urine. B2Microglobulin at 3.3. Bone Survey negative.  4)04/23/19: Bone Marrow biopsy performed, confirmed 10-15% clonal plasma cells (9% in aspirate). Confirmed diagnosis of Smoldering multiple myeloma, intermediate risk. 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 Gleason Score of 3+4 1) 05/06/17: Insertion of radioactive I-125 seeds into the prostate gland with placement of SpaceOAR; 145 Gy, definitive therapy. Radiation oncologist was Dr. Patrcia at [facility] 2) Subsequently followed by outside urology with serial PSA measurements. Reported last measurement in Oct 2020 at PSA 0.2.   Interval History:  [name] 89 y.o. male with medical history significant for smoldering multiple myeloma and prostate cancer presents for a follow up visit. He was last seen on 11/01/2023 for a routine  f/u. In the interim since his last visit [name] has had no changes in medications, no hospitalizations or emergency room visits.  On exam today [name] is accompanied by his son.  He reports that he did have a fall earlier this year where he tripped when he was going down the stairs in his house.  He did not require scan or hospitalization but did have some bruising.  He reports is not having any pain as a result of this fall.  He is not having any bone or back pain but does have some tightness in his legs and soreness in his hips when he first wakes up in the morning.  He reports it improves with activity and movement.  He is doing his best to try to stay active and recently celebrated his 89th birthday.  He reports that he is not having any urinary issues such as bubbling, foaming, urgency, or straining.  He notes that he does not have to get up at night to urinate.  Overall he feels quite well and has no additional questions concerns or complaints.  A full 10 point ROS is otherwise negative.  MEDICAL HISTORY:  Past Medical History:  Diagnosis Date   Anemia, unspecified 10/27/2009   BPH with urinary obstruction 12/13/2012   CAD (coronary artery disease)    LHC 03/25/11 by Dr. Wonda:  pLAD 99%, oCFX 20-30%, pOM1 40%, dAVCFX 20-30%.  EF was normal on nuclear study.  He was treated with a Promus DES to his pLAD.    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 02/22/2023   Monoclonal gammopathy 04/06/2019   Multiple closed fractures of ribs of left side 03/21/2019   Multiple myeloma not having achieved remission 08/13/2022   Osteoporosis 03/21/2019   Peripheral vascular disorder  due to diabetes mellitus 08/13/2022   Personal history of colonic polyps 1996   villous adenoma   Primary open angle glaucoma (POAG) of left eye, mild stage 02/04/2019   Primary open angle glaucoma (POAG) of right eye, moderate stage 02/04/2019   Prostate cancer 02/15/2017   Stage 3b chronic kidney disease 02/06/2020   Type II diabetes mellitus 07/04/2006   Vitamin D  deficiency 01/28/2021    SURGICAL HISTORY: Past Surgical History:  Procedure Laterality Date   CARDIAC CATHETERIZATION  03/25/2011   cataract Bilateral 2010   corenea implants  june and sept 2018   dr donnice [facility]   CORONARY STENT PLACEMENT  03/25/2011   RADIOACTIVE SEED IMPLANT N/A 05/06/2017   Procedure: RADIOACTIVE SEED IMPLANT/BRACHYTHERAPY IMPLANT;  Surgeon: Ottelin, Mark, MD;  Location[facility];  Service: Urology;  Laterality: N/A;   SPACE OAR INSTILLATION N/A 05/06/2017   Procedure: SPACE OAR INSTILLATION;  Surgeon: Ottelin, Mark, MD;  Location: [facility];  Service: Urology;  Laterality: N/A;    SOCIAL HISTORY: Social History   Socioeconomic History   Marital status: Widowed    Spouse name: Not on file   Number of children: Not on file   Years of education: 14   Highest education level: Associate degree: academic program  Occupational History   Occupation: Retired  Tobacco Use   Smoking status: Former    Current packs/day: 0.00    Average packs/day: 0.3 packs/day for 3.0 years (0.8 ttl pk-yrs)    Types: Cigarettes    Start date: 06/08/1955    Quit date: 06/07/1958    Years since quitting: 65.9   Smokeless tobacco: Never  Vaping Use   Vaping status: Never Used  Substance and Sexual Activity   Alcohol  use: Not Currently   Drug use: No   Sexual activity: Not Currently  Other Topics Concern   Not on file  Social History Narrative   Right   Lives with son    Retired   Two story home   Caffeine  2 cup a day   Still drives   Social Drivers of Health    Financial Resource Strain: Low Risk  (04/16/2024)   Overall Financial Resource Strain (CARDIA)    Difficulty of Paying Living Expenses: Not hard at all  Food Insecurity: No Food Insecurity (04/16/2024)   Hunger Vital Sign    Worried About Running Out of Food in the Last Year: Never true    Ran Out of Food in the Last Year: Never true  Transportation Needs: No Transportation Needs (04/16/2024)   PRAPARE - Administrator, Civil Service (Medical): No    Lack of Transportation (Non-Medical): No  Physical Activity: Insufficiently Active (04/16/2024)   Exercise Vital Sign    Days of Exercise per Week: 2 days    Minutes of Exercise per Session: 20 min  Stress: No Stress Concern Present (04/16/2024)   [employer] of Occupational Health - Occupational Stress Questionnaire    Feeling of Stress: Not at all  Social Connections: Moderately Integrated (04/16/2024)   Social Connection and Isolation Panel    Frequency of Communication with Friends and Family: More than three times a week    Frequency of Social Gatherings with Friends and Family: More than  three times a week    Attends Religious Services: More than 4 times per year    Active Member of Clubs or Organizations: Yes    Attends Banker Meetings: More than 4 times per year    Marital Status: Widowed  Intimate Partner Violence: Not At Risk (09/17/2022)   Humiliation, Afraid, Rape, and Kick questionnaire    Fear of Current or Ex-Partner: No    Emotionally Abused: No    Physically Abused: No    Sexually Abused: No    FAMILY HISTORY: Family History  Problem Relation Age of Onset   Stomach cancer Mother 67   Coronary artery disease Father 65       deceased   Healthy Son    Healthy Son    Healthy Son     ALLERGIES:  has no known allergies.  MEDICATIONS:  Current Outpatient Medications  Medication Sig Dispense Refill   acetaminophen  (TYLENOL ) 500 MG tablet Take 2 tablets (1,000 mg total) by mouth  every 6 (six) hours as needed. 60 tablet 0   Alcohol  Swabs  PADS Use as directed once a day 100 each 1   alendronate  (FOSAMAX ) 70 MG tablet TAKE 1 TABLET ONCE WEEKLY *TAKE WITH A FULL GLASS OF WATER ON AN EMPTY STOMACH* 12 tablet 0   aspirin 81 MG tablet Take 81 mg by mouth daily.     atorvastatin  (LIPITOR) 20 MG tablet TAKE 1 TABLET EVERY DAY 90 tablet 3   Blood Glucose Calibration (TRUE METRIX LEVEL 1) Low SOLN USE AS DIRECTED 1 each 3   Blood Glucose Calibration (TRUE METRIX LEVEL 3) High SOLN Use to check controls on glucometer strips every 30 days or with each new bottle of strips (whichever comes first). 3 each 1   Blood Glucose Monitoring Suppl (TRUE METRIX METER) w/Device KIT USE AS DIRECTED ONCE A DAY 1 kit 0   brimonidine (ALPHAGAN) 0.2 % ophthalmic solution SMARTSIG:In Eye(s)     brimonidine-timolol (COMBIGAN) 0.2-0.5 % ophthalmic solution Place 1 drop into both eyes 2 (two) times daily.     COLLAGEN PO Take by mouth. 1 scoop daily     dorzolamide-timolol (COSOPT) 2-0.5 % ophthalmic solution SMARTSIG:In Eye(s)     empagliflozin  (JARDIANCE ) 25 MG TABS tablet TAKE 1 TABLET EVERY DAY BEFORE BREAKFAST 90 tablet 1   fish oil-omega-3 fatty acids 1000 MG capsule Take 2 g by mouth daily.     glucose blood (TRUE METRIX BLOOD GLUCOSE TEST) test strip TEST BLOOD SUGAR ONE TIME DAILY 100 strip 3   insulin  glargine (LANTUS  SOLOSTAR) 100 UNIT/ML Solostar Pen Inject 30 Units into the skin daily. 30 mL 4   Insulin  Pen Needle 32G X 4 MM MISC 1 Device by Does not apply route daily in the afternoon. 100 each 3   latanoprost (XALATAN) 0.005 % ophthalmic solution SMARTSIG:In Eye(s)     linagliptin  (TRADJENTA ) 5 MG TABS tablet Take 1 tablet (5 mg total) by mouth daily. 90 tablet 3   losartan -hydrochlorothiazide (HYZAAR) 100-25 MG tablet TAKE 1 TABLET EVERY DAY 90 tablet 3   memantine  (NAMENDA ) 5 MG tablet Take 2 tabs at night and 1 tab in the morning 270 tablet 3   metoprolol  succinate (TOPROL -XL) 50 MG 24  hr tablet Take 1 tablet (50 mg total) by mouth daily. Take with or immediately following a meal 90 tablet 1   Multiple Vitamin (MULTIVITAMIN) tablet Take 1 tablet by mouth daily.     tamsulosin  (FLOMAX ) 0.4 MG CAPS capsule Take 1 capsule (  0.4 mg total) by mouth daily after supper. 90 capsule 1   TRUEplus Lancets 33G MISC Use to check blood sugar once a day.  DX  E11.9 100 each 1   vitamin B-12 (CYANOCOBALAMIN ) 100 MCG tablet Take 100 mcg by mouth daily.     No current facility-administered medications for this visit.    REVIEW OF SYSTEMS:   Constitutional: ( - ) fevers, ( - )  chills , ( - ) night sweats Eyes: ( - ) blurriness of vision, ( - ) double vision, ( - ) watery eyes Ears, nose, mouth, throat, and face: ( - ) mucositis, ( - ) sore throat Respiratory: ( - ) cough, ( - ) dyspnea, ( - ) wheezes Cardiovascular: ( - ) palpitation, ( - ) chest discomfort, ( - ) lower extremity swelling Gastrointestinal:  ( - ) nausea, ( - ) heartburn, ( - ) change in bowel habits Skin: ( - ) abnormal skin rashes Lymphatics: ( - ) new lymphadenopathy, ( - ) easy bruising Neurological: ( - ) numbness, ( - ) tingling, ( - ) new weaknesses Behavioral/Psych: ( - ) mood change, ( - ) new changes  All other systems were reviewed with the patient and are negative.  PHYSICAL EXAMINATION: ECOG PERFORMANCE STATUS: 0 - Asymptomatic  Vitals:   05/09/24 0849  BP: 132/68  Pulse: 77  Resp: 14  Temp: (!) 97.2 F (36.2 C)  SpO2: 98%      Filed Weights   05/09/24 0849  Weight: 129 lb 12.8 oz (58.9 kg)       GENERAL: well appearing elderly Caucasian male, alert, no distress and comfortable SKIN: skin color, texture, turgor are normal, no rashes or significant lesions EYES: conjunctiva are pink and non-injected, sclera clear LUNGS: clear to auscultation and percussion with normal breathing effort HEART: regular rate & rhythm and no murmurs and no lower extremity edema Musculoskeletal: no cyanosis of  digits and no clubbing  PSYCH: alert & oriented x 3, fluent speech NEURO: no focal motor/sensory deficits  LABORATORY DATA:  I have reviewed the data as listed     Latest Ref Rng & Units 05/09/2024    8:13 AM 04/17/2024    3:39 PM 02/16/2024    9:27 AM  CMP  Glucose 70 - 99 mg/dL 750  837  863   BUN 8 - 23 mg/dL 33  32  25   Creatinine 0.61 - 1.24 mg/dL 8.36  8.45  8.30   Sodium 135 - 145 mmol/L 141  140  143   Potassium 3.5 - 5.1 mmol/L 3.7  3.9  3.8   Chloride 98 - 111 mmol/L 101  98  103   CO2 22 - 32 mmol/L 29  32  29   Calcium  8.9 - 10.3 mg/dL 9.4  9.8  9.5   Total Protein 6.5 - 8.1 g/dL 7.1  6.9  7.1   Total Bilirubin 0.0 - 1.2 mg/dL 0.6  0.8  0.8   Alkaline Phos 38 - 126 U/L 131  103  95   AST 15 - 41 U/L 14  13  12    ALT 0 - 44 U/L 14  12  17        Latest Ref Rng & Units 05/09/2024    8:13 AM 04/17/2024    3:39 PM 02/16/2024    9:27 AM  CBC  WBC 4.0 - 10.5 K/uL 11.9  11.4  11.1   Hemoglobin 13.0 - 17.0 g/dL 85.7  14.4  14.4   Hematocrit 39.0 - 52.0 % 41.7  43.1  43.4   Platelets 150 - 400 K/uL 222  227.0  217.0      Surgical Pathology  CASE: [id number]  PATIENT: Ledarrius Beauchaine Melander  Bone Marrow Report   Clinical History: MGUS, left posterior iliac crest, (ADC)   DIAGNOSIS:   BONE MARROW, ASPIRATE, CLOT, CORE:  - Plasma cell myeloma, see comment.  - No amyloid deposits.  - Minimal iron stores.   PERIPHERAL BLOOD:  - Normocytic anemia.   COMMENT:   The marrow is mildly hypercellular with increased monotypic plasma cells  (9% aspirate, 10-15% CD138 immunohistochemistry). There are no amyloid  deposits seen with Congo red, although there is a lack of larger vessels  in the core biopsy. The findings are consistent with plasma cell  myeloma.   MICROSCOPIC DESCRIPTION:   PERIPHERAL BLOOD SMEAR: There is a normocytic anemia with scattered  elliptocytes/ovalocytes.  There is no rouleaux formation.  Leukocytes  are present in normal numbers.  Circulating plasma  cells are not  identified.  Platelets are present in normal numbers.   BONE MARROW ASPIRATE: Spicular cellular.  Erythroid precursors: Present in appropriate proportions.  No  significant dysplasia.  Granulocytic precursors: Present in appropriate proportions.  No  significant dysplasia.  No increase in blasts.  Megakaryocytes: Present and largely unremarkable.  Lymphocytes/plasma cells: There is a mild increase in plasma cells (9%  by manual differential counts) with scattered atypical forms (large  forms, binucleation).  Lymphocytes are not increased.   TOUCH PREPARATIONS: Similar to aspirate smears.   CLOT AND BIOPSY: The core biopsies and clot sections are mildly  hypercellular for age (20 to 30%).  CD138 immunohistochemistry reveals  increased plasma cells (10 to 15%) scattered in small clusters.  By  light chain in situ hybridization plasma cells are lambda restricted.  Myeloid and erythroid elements are present in appropriate proportions.  Megakaryocytes exhibit a spectrum of maturation without tight clusters.  There are few benign appearing lymphoid aggregates. Congo red is  negative for amyloid deposits.   IRON STAIN: Iron stains are performed on a bone marrow aspirate or touch  imprint smear and section of clot. The controls stained appropriately.        Storage Iron: Minimal.       Ring Sideroblasts: Absent.   ADDITIONAL DATA/TESTING: Cytogenetics, including FISH for myeloma, was  ordered and will be reported separately.   CELL COUNT DATA:   Bone Marrow count performed on 500 cells shows:  Blasts:   0%   Myeloid:  47%  Promyelocytes: 0%   Erythroid:     23%  Myelocytes:    5%   Lymphocytes:   17%  Metamyelocytes:     0%   Plasma cells:  9%  Bands:    16%  Neutrophils:   22%  M:E ratio:     2.04  Eosinophils:   4%  Basophils:     0%  Monocytes:     4%   Lab Data: CBC performed on 04/23/2019 shows:  WBC: 8.6 k/uL  Neutrophils:   71%  Hgb: 12.4 g/dL  Lymphocytes:   74%  HCT: 38.0 %    Monocytes:     3%  MCV: 92.7 fL   Eosinophils:   0%  RDW: 13.8 %    Basophils:     1%  PLT: 221 k/uL   GROSS DESCRIPTION:   A: Aspirate smear   B: The specimen is received in B-plus fixative  and consists of a 21.0 x  12.0 x 5.0 mm aggregate of red-brown clotted blood.  The specimen is  entirely submitted in cassette B.   C: The specimen is received in B-plus fixative and consists of 2 cores  of tan bone, measuring 0.6 and 0.9 cm in length by 0.2 cm in diameter.  The specimen is entirely submitted in cassette C. SHIRLEEN 04/25/2019)   Final Diagnosis performed by Recardo Likens, MD.   Electronically signed  04/25/2019  Technical and / or Professional components performed at [facility], [address], [place], [place] [address].   Immunohistochemistry Technical component (if applicable) was performed  at [facility]. [address], [address],  [place], [place] [address].   IMMUNOHISTOCHEMISTRY DISCLAIMER (if applicable):  Some of these immunohistochemical stains may have been developed and the  performance characteristics determine by [facility]. Some  may not have been cleared or approved by the U.S. Food and Drug  Administration. The FDA has determined that such clearance or approval  is not necessary. This test is used for clinical purposes. It should not  be regarded as investigational or for research. This laboratory is  certified under the Clinical Laboratory Improvement Amendments of 1988  (CLIA-88) as qualified to perform high complexity clinical laboratory  testing.  The controls stained appropriately.   RADIOGRAPHIC STUDIES: No relevant radiographic studies.   ASSESSMENT & PLAN [name] is a 89 y.o. male with medical history significant for CAD s/p PCI in 2012, DM type II, Prostate cancer s/p brachytherapy, and HTN who presents for follow up for his smoldering multiple myeloma.    #Smoldering Multiple Myeloma, Intermediate Risk --confirmed diagnosis based on the bone marrow biopsy, with plasma cells of 10-15% and no CRAB criteria. No evidence of amyloidosis on bone marrow stain.  --treatment of Smoldering multiple myeloma is a newer idea and predominately consists of monotherapy lenalidomide. Using the [facility] 2018 20/2/20 guidelines, [name] is a borderline Intermediate Risk SMM.  --given his advance aged and lack of any CRAB criteria, I would recommend holding on treatment at this time with close clinical monitoring for progression. [name] was in agreement with close continued monitoring.  --will collect SPEP, UPEP, and SFLC on a routine basis, q 6 months.  -- will order metastatic survey yearly PLAN: --labs today show WBC 11.9, hemoglobin 14.2, MCV 86.5, platelets 222 --most recent bone met survey from 06/03/2023 didn't show any evidence of lytic lesions or pathologic fractures. Repeat scan q 1-2 years.  --M protein undetectable at last check. Kappa 33.1, Lambda 590.7, Ratio 0.06  --RTC in 6 months time unless remaining labs require any intervention.   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  - PSA <0.1 at last check   All questions were answered. The patient knows to call the clinic with any problems, questions or concerns.  I have spent a total of 30 minutes minutes of face-to-face and non-face-to-face time, preparing to see the patient,  performing a medically appropriate examination, counseling and educating the patient, ordering tests/procedures, communicating with other health care professionals, documenting clinical information in the electronic health record,  and care coordination.   Norleen IVAR Kidney, MD Department of Hematology/Oncology [facility] Phone: [phone] Pager: [phone] Email: [email or web address]  05/09/2024 8:59 AM  Literature Support:  Christyne GORMAN Suzon GORMAN Thomos JONELLE Boyd CHRISTELLA Von GORMAN Larri SENAIDA, Lonza ORLINDA Packer AM, Buadi FK, Franklin DASEN, Matous JV, Anderson DM, Emmons RV, Mahindra A, Wagner LI, Dhodapkar MV, Rajkumar SV. Randomized Trial of Lenalidomide Versus Observation in Smoldering Multiple Myeloma. J Clin Oncol. 2020 Apr 10;38(11):1126-1137. doi: 10.1200/JCO.19.01740. Epub 2019 Oct 25. PMID: 68347905; PMCID: EFR2854413. --Progression-free survival was significantly longer with lenalidomide compared with observation (hazard ratio, 0.28; 95% CI, 0.12 to 0.62; P = .002). One-, 2-, and 3-year progression-free survival was 98%, 93%, and 91% for the lenalidomide arm versus 89%, 76%, and 66% for the observation arm, respectively.  Lakshman, A., Rajkumar, S.V., Buadi, F.K. et al. Risk stratification of smoldering multiple myeloma incorporating revised IMWG diagnostic criteria. Blood Cancer Journal 8, 59 (2018). [email or web address] --The median TTP for low-, intermediate-, and high-risk groups were 110, 68, and 29 months, respectively (p?<?0.0001). BMPC%?>?20%, M-protein?>?2?g/dL, and QORm?>?79 at diagnosis can be used to risk stratify patients with SMM. Patients with high-risk SMM need close follow-up and are candidates for clinical trials aiming to prevent progression.

## 2024-05-10 LAB — KAPPA/LAMBDA LIGHT CHAINS
Kappa free light chain: 36.8 mg/L — ABNORMAL HIGH (ref 3.3–19.4)
Kappa, lambda light chain ratio: 0.05 — ABNORMAL LOW (ref 0.26–1.65)
Lambda free light chains: 695.5 mg/L — ABNORMAL HIGH (ref 5.7–26.3)

## 2024-05-11 LAB — MULTIPLE MYELOMA PANEL, SERUM
Albumin SerPl Elph-Mcnc: 3.3 g/dL (ref 2.9–4.4)
Albumin/Glob SerPl: 1.2 (ref 0.7–1.7)
Alpha 1: 0.2 g/dL (ref 0.0–0.4)
Alpha2 Glob SerPl Elph-Mcnc: 0.8 g/dL (ref 0.4–1.0)
B-Globulin SerPl Elph-Mcnc: 0.9 g/dL (ref 0.7–1.3)
Gamma Glob SerPl Elph-Mcnc: 1 g/dL (ref 0.4–1.8)
Globulin, Total: 3 g/dL (ref 2.2–3.9)
IgA: 201 mg/dL (ref 61–437)
IgG (Immunoglobin G), Serum: 1222 mg/dL (ref 603–1613)
IgM (Immunoglobulin M), Srm: 75 mg/dL (ref 15–143)
Total Protein ELP: 6.3 g/dL (ref 6.0–8.5)

## 2024-05-12 ENCOUNTER — Other Ambulatory Visit: Payer: Self-pay | Admitting: Family Medicine

## 2024-05-12 DIAGNOSIS — I10 Essential (primary) hypertension: Secondary | ICD-10-CM

## 2024-06-04 ENCOUNTER — Ambulatory Visit: Payer: Self-pay

## 2024-06-04 ENCOUNTER — Encounter (HOSPITAL_COMMUNITY): Payer: Self-pay

## 2024-06-04 ENCOUNTER — Emergency Department (HOSPITAL_COMMUNITY)

## 2024-06-04 ENCOUNTER — Other Ambulatory Visit: Payer: Self-pay

## 2024-06-04 ENCOUNTER — Emergency Department (HOSPITAL_COMMUNITY)
Admission: EM | Admit: 2024-06-04 | Discharge: 2024-06-05 | Disposition: A | Discharge status: Home / Self Care | Attending: Emergency Medicine | Admitting: Emergency Medicine

## 2024-06-04 DIAGNOSIS — Z7982 Long term (current) use of aspirin: Secondary | ICD-10-CM | POA: Diagnosis not present

## 2024-06-04 DIAGNOSIS — M5459 Other low back pain: Secondary | ICD-10-CM | POA: Diagnosis present

## 2024-06-04 DIAGNOSIS — M546 Pain in thoracic spine: Secondary | ICD-10-CM | POA: Insufficient documentation

## 2024-06-04 DIAGNOSIS — M549 Dorsalgia, unspecified: Secondary | ICD-10-CM

## 2024-06-04 LAB — CBC
HCT: 44.1 % (ref 39.0–52.0)
Hemoglobin: 14.7 g/dL (ref 13.0–17.0)
MCH: 30.1 pg (ref 26.0–34.0)
MCHC: 33.3 g/dL (ref 30.0–36.0)
MCV: 90.2 fL (ref 80.0–100.0)
Platelets: 210 K/uL (ref 150–400)
RBC: 4.89 MIL/uL (ref 4.22–5.81)
RDW: 14.2 % (ref 11.5–15.5)
WBC: 22.1 K/uL — ABNORMAL HIGH (ref 4.0–10.5)
nRBC: 0.1 % (ref 0.0–0.2)

## 2024-06-04 LAB — COMPREHENSIVE METABOLIC PANEL WITH GFR
ALT: 23 U/L (ref 0–44)
AST: 21 U/L (ref 15–41)
Albumin: 4.2 g/dL (ref 3.5–5.0)
Alkaline Phosphatase: 112 U/L (ref 38–126)
Anion gap: 16 — ABNORMAL HIGH (ref 5–15)
BUN: 39 mg/dL — ABNORMAL HIGH (ref 8–23)
CO2: 26 mmol/L (ref 22–32)
Calcium: 9.8 mg/dL (ref 8.9–10.3)
Chloride: 100 mmol/L (ref 98–111)
Creatinine, Ser: 1.98 mg/dL — ABNORMAL HIGH (ref 0.61–1.24)
GFR, Estimated: 31 mL/min — ABNORMAL LOW
Glucose, Bld: 224 mg/dL — ABNORMAL HIGH (ref 70–99)
Potassium: 3.6 mmol/L (ref 3.5–5.1)
Sodium: 142 mmol/L (ref 135–145)
Total Bilirubin: 1.1 mg/dL (ref 0.0–1.2)
Total Protein: 7.3 g/dL (ref 6.5–8.1)

## 2024-06-04 LAB — LIPASE, BLOOD: Lipase: <10 U/L — ABNORMAL LOW (ref 11–51)

## 2024-06-04 LAB — TROPONIN T, HIGH SENSITIVITY: Troponin T High Sensitivity: 48 ng/L — ABNORMAL HIGH (ref 0–19)

## 2024-06-04 MED ORDER — ACETAMINOPHEN 325 MG PO TABS
650.0000 mg | ORAL_TABLET | Freq: Four times a day (QID) | ORAL | Status: AC | PRN
  Administered 2024-06-04: 650 mg via ORAL
  Filled 2024-06-04: qty 2

## 2024-06-04 NOTE — Telephone Encounter (Signed)
FYI. Pt going to ED.  

## 2024-06-04 NOTE — ED Provider Triage Note (Signed)
 Emergency Medicine Provider Triage Evaluation Note  [name] , a 89 y.o. male  was evaluated in triage.  Pt complains of cp. Endorse epigastric abd pain/chest pain along with back pain since yesterday.  Sxs is improving. Denies cold sxs, no sob, cough, lighthead, dizzy, dysuria, n/v/d.  Son felt pt is a bit more confuse than his baseline dementia  Review of Systems  Positive: As above Negative: As above  Physical Exam  BP 108/67   Pulse 95   Temp 98.5 F (36.9 C)   Resp 18   Ht 5' 3.5 (1.613 m)   Wt 56.7 kg   SpO2 94%   BMI 21.80 kg/m  Gen:   Awake, no distress   Resp:  Normal effort  MSK:   Moves extremities without difficulty  Other:    Medical Decision Making  Medically screening exam initiated at 9:56 PM.  Appropriate orders placed.  [name] was informed that the remainder of the evaluation will be completed by another provider, this initial triage assessment does not replace that evaluation, and the importance of remaining in the ED until their evaluation is complete.     Nivia Colon, PA-C 06/04/24 2159

## 2024-06-04 NOTE — ED Triage Notes (Signed)
 Patients PCP sent him here for evaluation. Patient is having epigastric pain that radiates into his back. He reports that the pain is sharp and shooting if he moves. His PCP was concerned aneurysm or cardiac issue.  Patient has cardiac history, previous strokes and dementia. He was bib son.

## 2024-06-04 NOTE — Telephone Encounter (Signed)
 FYI Only or Action Required?: FYI only for provider: ED advised.  Patient was last seen in primary care on 04/17/2024 by [name], Jamee SAUNDERS, DO.  Called Nurse Triage reporting Back Pain and Abdominal Pain.  Symptoms began yesterday.  Interventions attempted: Ice/heat application.  Symptoms are: gradually worsening.  Triage Disposition: Go to ED Now (or PCP Triage)  Patient/caregiver understands and will follow disposition?: Yes   Called [phone], pts son Signe (goes by [place]) picked up. On DPR. There with pt now.  Yesterday onset of moderate to severe pain across both scapulas and mild pain in lower stomach. Upper back pain improves with heat. Worse when standing up or walking or laying on it. Mild lower abdominal pain. Pt has hx of dementia and difficult to get precise information from. Seems a little more confused than normal per son. No recent injuries. No CP or SOB or dizziness. No numbness or weakness or changes to bowel or bladder control. Speaking in clear sentences but some confusion noted over the phone and pt with difficulty answering question directly. Advised ED. Agreeable to go. Advised 911 for worsening symptoms.     Message from [place] sent at 06/04/2024  3:11 PM EST  Reason for Triage:  soreness on top of stomach and middle of back. Call back number [phone]   Reason for Disposition  [1] Abdominal pain AND [2] age > 60 years  Answer Assessment - Initial Assessment Questions 1. ONSET: When did the pain begin? (e.g., minutes, hours, days)     Yesterday  2. LOCATION: Where does it hurt? (upper, mid or lower back)     Upper back across both scapula  3. SEVERITY: How bad is the pain?  (e.g., Scale 1-10; mild, moderate, or severe)     Moderate  4. PATTERN: Is the pain constant? (e.g., yes, no; constant, intermittent)      Constant but fluctuates  5. RADIATION: Does the pain shoot into your legs or somewhere else?     Denies  6. CAUSE:   What do you think is causing the back pain?      Unsure  7. BACK OVERUSE:  Any recent lifting of heavy objects, strenuous work or exercise?     Denies  8. MEDICINES: What have you taken so far for the pain? (e.g., nothing, acetaminophen , NSAIDS)     Denies  9. NEUROLOGIC SYMPTOMS: Do you have any weakness, numbness, or problems with bowel/bladder control?     Denies  10. OTHER SYMPTOMS: Do you have any other symptoms? (e.g., fever, abdomen pain, burning with urination, blood in urine)       Mild pain in lower abdomen  Protocols used: Back Pain-A-AH

## 2024-06-05 ENCOUNTER — Emergency Department (HOSPITAL_COMMUNITY)

## 2024-06-05 LAB — URINALYSIS, ROUTINE W REFLEX MICROSCOPIC
Bilirubin Urine: NEGATIVE
Glucose, UA: >=500 mg/dL — AB
Hgb urine dipstick: NEGATIVE
Ketones, ur: NEGATIVE mg/dL
Leukocytes,Ua: NEGATIVE
Nitrite: NEGATIVE
Protein, ur: 100 mg/dL — AB
Specific Gravity, Urine: 1.023 (ref 1.005–1.030)
pH: 5 (ref 5.0–8.0)

## 2024-06-05 LAB — TROPONIN T, HIGH SENSITIVITY: Troponin T High Sensitivity: 47 ng/L — ABNORMAL HIGH (ref 0–19)

## 2024-06-05 LAB — D-DIMER, QUANTITATIVE: D-Dimer, Quant: 16.32 ug{FEU}/mL — ABNORMAL HIGH (ref 0.00–0.50)

## 2024-06-05 MED ORDER — SODIUM CHLORIDE 0.9 % IV BOLUS
1000.0000 mL | Freq: Once | INTRAVENOUS | Status: AC
  Administered 2024-06-05: 1000 mL via INTRAVENOUS

## 2024-06-05 MED ORDER — IOHEXOL 350 MG/ML SOLN
100.0000 mL | Freq: Once | INTRAVENOUS | Status: AC | PRN
  Administered 2024-06-05: 100 mL via INTRAVENOUS

## 2024-06-05 MED ORDER — DICLOFENAC SODIUM 1 % EX GEL: 4.0000 g | Freq: Four times a day (QID) | CUTANEOUS | 0 refills | Status: AC

## 2024-06-05 NOTE — Discharge Instructions (Addendum)
 Your imaging showed some diffuse signs of atherosclerosis.  There was also some concern that there was some narrowing to one of the blood vessels that goes to your intestines.  I do not think that these are causing your back pain.  Please discuss these results with your family doctor.  They may want you seen by a vascular surgeon especially if you are having pain with eating.  I did give you information in this paperwork for the vascular surgeon that is on-call for us  today.  If you would like, you could give them a call on your own to set up an appointment.  Your white blood cell count was elevated here.  I do not know the exact reason for this.  I did not find any obvious signs of bacterial infection.  Please return anytime your symptoms worsen especially if you develop a fever.  I did place a prescription for Voltaren gel.  This could also be bought over-the-counter.  You could try applying it to the affected areas and see if it helps.

## 2024-06-05 NOTE — ED Provider Notes (Signed)
 " [name]   CSN: [id number] Arrival date & time: 06/04/24  1749     Patient presents with: Abdominal Pain and Back Pain   [name] is a 89 y.o. male.   89 yo M with a chief complaints of upper and lower back pain.  Going on for about 3 days.  He feels like its actually gotten a little bit better.  He called his family doctor today because it had not resolved and they had encouraged him to come to the ED for evaluation.  He again tells me it is much better now.  Denies trauma.  Denies cough congestion or fever.  Denies abdominal pain for me but had mentioned abdominal pain earlier in the visit.  Tells me the pain mostly is in the low and upper back.  Right along the belt line on the lower back worse with lying in bed and trying to stand and walk.  Also some pain to the right side just underneath the scapula.  Denies radiation down the legs.  Denies numbness or weakness to the legs.  Denies issues with bowel or bladder.   Abdominal Pain Back Pain Associated symptoms: abdominal pain        Prior to Admission medications  Medication Sig Start Date End Date Taking? Authorizing Provider  diclofenac Sodium (VOLTAREN) 1 % GEL Apply 4 g topically 4 (four) times daily. 06/05/24  Yes Emil Share, DO  acetaminophen  (TYLENOL ) 500 MG tablet Take 2 tablets (1,000 mg total) by mouth every 6 (six) hours as needed. 06/26/22   [name], MD  Alcohol  Swabs  PADS Use as directed once a day 11/10/20   [name], Jamee SAUNDERS, DO  alendronate  (FOSAMAX ) 70 MG tablet TAKE 1 TABLET ONCE WEEKLY *TAKE WITH A FULL GLASS OF WATER ON AN EMPTY STOMACH* 05/16/22   [name], [name] ORN, MD  aspirin 81 MG tablet Take 81 mg by mouth daily.    [provider]  atorvastatin  (LIPITOR) 20 MG tablet TAKE 1 TABLET EVERY DAY 08/17/23   [name], [name], DO  Blood Glucose Calibration (TRUE METRIX LEVEL 1) Low SOLN USE AS DIRECTED 03/19/20    [name], [name] [name], MD  Blood Glucose Calibration (TRUE METRIX LEVEL 3) High SOLN Use to check controls on glucometer strips every 30 days or with each new bottle of strips (whichever comes first). 01/24/20   [name] Jamee R, DO  Blood Glucose Monitoring Suppl (TRUE METRIX METER) w/Device KIT USE AS DIRECTED ONCE A DAY 08/14/20   [name], [name], MD  brimonidine (ALPHAGAN) 0.2 % ophthalmic solution SMARTSIG:In Eye(s)    [provider]  brimonidine-timolol (COMBIGAN) 0.2-0.5 % ophthalmic solution Place 1 drop into both eyes 2 (two) times daily.    [provider]  COLLAGEN PO Take by mouth. 1 scoop daily    [provider]  dorzolamide-timolol (COSOPT) 2-0.5 % ophthalmic solution SMARTSIG:In Eye(s)    [provider]  empagliflozin  (JARDIANCE ) 25 MG TABS tablet TAKE 1 TABLET EVERY DAY BEFORE BREAKFAST 03/07/24   [name], [name], MD  fish oil-omega-3 fatty acids 1000 MG capsule Take 2 g by mouth daily.    [provider]  glucose blood (TRUE METRIX BLOOD GLUCOSE TEST) test strip TEST BLOOD SUGAR ONE TIME DAILY 08/17/23   [name], [name], DO  insulin  glargine (LANTUS  SOLOSTAR) 100 UNIT/ML Solostar Pen Inject 30 Units into the skin daily. 04/13/24   [name], Ibtehal  Jaralla, MD  Insulin  Pen Needle 32G X 4 MM MISC 1 Device by Does not apply route daily in the afternoon. 08/12/23   [name], [name], MD  latanoprost (XALATAN) 0.005 % ophthalmic solution SMARTSIG:In Eye(s) 03/17/23   [provider]  linagliptin  (TRADJENTA ) 5 MG TABS tablet Take 1 tablet (5 mg total) by mouth daily. 12/12/23   [name], [name], MD  losartan -hydrochlorothiazide (HYZAAR) 100-25 MG tablet TAKE 1 TABLET EVERY DAY 08/17/23   [name], [name], DO  memantine  (NAMENDA ) 5 MG tablet Take 2 tabs at night and 1 tab in the morning 12/16/23   [name], [name], PA-C  metoprolol  succinate (TOPROL -XL) 50 MG 24 hr tablet TAKE  1 TABLET EVERY DAY WITH OR IMMEDIATELY FOLLOWING A MEAL 05/14/24   [name], [name], DO  Multiple Vitamin (MULTIVITAMIN) tablet Take 1 tablet by mouth daily.    [provider]  tamsulosin  (FLOMAX ) 0.4 MG CAPS capsule TAKE 1 CAPSULE EVERY DAY AFTER SUPPER 05/14/24   [name], [name], DO  TRUEplus Lancets 33G MISC Use to check blood sugar once a day.  DX  E11.9 11/10/20   [name], [name], DO  vitamin B-12 (CYANOCOBALAMIN ) 100 MCG tablet Take 100 mcg by mouth daily.    [provider]    Allergies: Patient has no known allergies.    Review of Systems  Gastrointestinal:  Positive for abdominal pain.  Musculoskeletal:  Positive for back pain.    Updated Vital Signs BP 139/70 (BP Location: Right Arm)   Pulse 87   Temp 97.9 F (36.6 C) (Oral)   Resp 17   Ht 5' 3.5 (1.613 m)   Wt 56.7 kg   SpO2 93%   BMI 21.80 kg/m   Physical Exam Vitals and nursing [name] reviewed.  Constitutional:      Appearance: He is well-developed.  HENT:     Head: Normocephalic and atraumatic.  Eyes:     Pupils: Pupils are equal, round, and reactive to light.  Neck:     Vascular: No JVD.  Cardiovascular:     Rate and Rhythm: Normal rate and regular rhythm.     Heart sounds: No murmur heard.    No friction rub. No gallop.  Pulmonary:     Effort: No respiratory distress.     Breath sounds: No wheezing.  Abdominal:     General: There is no distension.     Tenderness: There is no abdominal tenderness. There is no guarding or rebound.  Musculoskeletal:        General: Normal range of motion.     Cervical back: Normal range of motion and neck supple.     Comments: No obvious midline spinal tenderness step-offs or deformities.  Patient with some mild lumbar paraspinal muscular tenderness about the SI joint bilaterally.  Pulse motor and sensation intact in bilateral lower extremities.  He has palpable discomfort just underneath the right scapular tip.    Skin:    Coloration:  Skin is not pale.     Findings: No rash.  Neurological:     Mental Status: He is alert and oriented to person, place, and time.  Psychiatric:        Behavior: Behavior normal.     (all labs ordered are listed, but only abnormal results are displayed) Labs Reviewed  LIPASE, BLOOD - Abnormal; Notable for the following components:      Result Value   Lipase <10 (*)    All other components  within normal limits  COMPREHENSIVE METABOLIC PANEL WITH GFR - Abnormal; Notable for the following components:   Glucose, Bld 224 (*)    BUN 39 (*)    Creatinine, Ser 1.98 (*)    GFR, Estimated 31 (*)    Anion gap 16 (*)    All other components within normal limits  CBC - Abnormal; Notable for the following components:   WBC 22.1 (*)    All other components within normal limits  URINALYSIS, ROUTINE W REFLEX MICROSCOPIC - Abnormal; Notable for the following components:   Glucose, UA >=500 (*)    Protein, ur 100 (*)    Bacteria, UA RARE (*)    All other components within normal limits  D-DIMER, QUANTITATIVE - Abnormal; Notable for the following components:   D-Dimer, Quant 16.32 (*)    All other components within normal limits  TROPONIN T, HIGH SENSITIVITY - Abnormal; Notable for the following components:   Troponin T High Sensitivity 48 (*)    All other components within normal limits  TROPONIN T, HIGH SENSITIVITY - Abnormal; Notable for the following components:   Troponin T High Sensitivity 47 (*)    All other components within normal limits    EKG: EKG Interpretation Date/Time:  Monday June 04 2024 21:54:58 EST Ventricular Rate:  100 PR Interval:  140 QRS Duration:  84 QT Interval:  346 QTC Calculation: 446 R Axis:   11  Text Interpretation: Normal sinus rhythm Minimal voltage criteria for LVH, may be normal variant ( R in aVL ) Nonspecific ST abnormality Abnormal ECG Baseline wander TECHNICALLY DIFFICULT isolated flipped t eave in lead III Otherwise no significant change  Confirmed by Emil Share [phone]) on 06/05/2024 12:36:39 AM  Radiology: CT Angio Chest/Abd/Pel for Dissection W and/or Wo Contrast Result Date: 06/05/2024 EXAM: CTA CHEST, ABDOMEN AND PELVIS WITHOUT AND WITH CONTRAST 06/05/2024 02:32:34 AM TECHNIQUE: CTA of the chest was performed without and with the administration of 100 mL of iohexol  (OMNIPAQUE ) 350 MG/ML injection. CTA of the abdomen and pelvis was performed without and with the administration of 100 mL of iohexol  (OMNIPAQUE ) 350 MG/ML injection. Multiplanar reformatted images are provided for review. MIP images are provided for review. Automated exposure control, iterative reconstruction, and/or weight based adjustment of the mA/kV was utilized to reduce the radiation dose to as low as reasonably achievable. COMPARISON: 03/20/2019 CLINICAL HISTORY: Acute aortic syndrome (AAS) suspected. FINDINGS: VASCULATURE: AORTA: Calcific aortic atherosclerosis. No aortic aneurysm or dissection. PULMONARY ARTERIES: No pulmonary embolism with the limits of this exam. GREAT VESSELS OF AORTIC ARCH: No acute finding. No dissection. No arterial occlusion or significant stenosis. CELIAC TRUNK: No acute finding. No occlusion or significant stenosis. SUPERIOR MESENTERIC ARTERY: No acute finding. No occlusion or significant stenosis. 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 RENAL ARTERIES: No acute finding. No occlusion or significant stenosis. ILIAC ARTERIES: Atherosclerosis of the internal, external, and common iliac arteries without high-grade stenosis. CHEST: MEDIASTINUM: No mediastinal lymphadenopathy. The heart and pericardium demonstrate no acute abnormality. Coronary artery calcifications. LUNGS AND PLEURA: Left basilar atelectasis. No focal consolidation or pulmonary edema. No evidence of pleural effusion or pneumothorax. THORACIC BONES AND SOFT TISSUES: No acute bone or soft tissue abnormality. ABDOMEN AND PELVIS:  LIVER: The liver is unremarkable. GALLBLADDER AND BILE DUCTS: Gallbladder is unremarkable. No biliary ductal dilatation. SPLEEN: The spleen is unremarkable. PANCREAS: The pancreas is unremarkable. ADRENAL GLANDS: Bilateral adrenal glands demonstrate no acute abnormality. KIDNEYS, URETERS AND BLADDER: Multiple subcentimeter low  attenuation foci of the left kidney, too small to be characterized definitively. No stones in the kidneys or ureters. No hydronephrosis. No perinephric or periureteral stranding. Urinary bladder is unremarkable. GI AND BOWEL: Stomach and duodenal sweep demonstrate no acute abnormality. There is no bowel obstruction. No abnormal bowel wall thickening or distension. REPRODUCTIVE: Reproductive organs are unremarkable. PERITONEUM AND RETROPERITONEUM: No ascites or free air. LYMPH NODES: No lymphadenopathy. ABDOMINAL BONES AND SOFT TISSUES: No acute abnormality of the bones. No acute soft tissue abnormality. IMPRESSION: 1. No aortic aneurysm or dissection. 2. 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 3. Atherosclerosis of the internal, external, and common iliac arteries without high-grade stenosis. 4. Multiple subcentimeter low attenuation foci of the left kidney, too small to be characterized definitively. Electronically signed by: Franky Stanford MD 06/05/2024 03:48 AM EST RP Workstation: [id number]   DG Chest 2 View Result Date: 06/05/2024 EXAM: 2 VIEW(S) XRAY OF THE CHEST 06/04/2024 10:13:33 PM COMPARISON: Comparison 06/26/2022. CLINICAL HISTORY: Chest pain. FINDINGS: LUNGS AND PLEURA: Underinflation. Elevated left hemidiaphragm. Basilar atelectasis. No pleural effusion. No pneumothorax. HEART AND MEDIASTINUM: Calcified aorta. BONES AND SOFT TISSUES: Degenerative changes in spine. IMPRESSION: 1. No acute cardiopulmonary findings. 2. Underinflation with elevated left hemidiaphragm and basilar atelectasis. 3. Atherosclerotic calcification of the aorta. Electronically  signed by: Franky Stanford MD 06/05/2024 12:54 AM EST RP Workstation: [id number]     Procedures   Medications Ordered in the ED  acetaminophen  (TYLENOL ) tablet 650 mg (650 mg Oral Given 06/04/24 2249)  sodium chloride  0.9 % bolus 1,000 mL (0 mLs Intravenous Stopped 06/05/24 0202)  iohexol  (OMNIPAQUE ) 350 MG/ML injection 100 mL (100 mLs Intravenous Contrast Given 06/05/24 0232)                                    Medical Decision Making Amount and/or Complexity of Data Reviewed Labs: ordered. Radiology: ordered.  Risk OTC drugs. Prescription drug management.   89 yo M with a chief complaints of back pain.  Most likely musculoskeletal by history and physical.  It sounds like he had describes some pain to the anterior aspect of the chest that radiate into the shoulders initially.  Seems less likely to be dissection but based on that history is somewhat concerning.  Will obtain CT imaging.  His troponin is mildly elevated.  Leukocytosis.  White blood cell count elevation.  Offered pain medicine and he is declining.  IV fluids.  Reassess.  UA without obvious infection.  CT dissection study is negative for acute dissection.  Diffuse atherosclerotic disease.  Some stenosis of the inferior mesenteric artery, I am not sure that this would explain his back discomfort.  I discussed the results with the patient and family.  They are willing to have a repeat troponin.  Continue to observe.  Reassess.  Second troponin is unchanged.  I discussed results with patient and family.  They would like to go home at this time.  I encouraged him to follow-up with their doctor in clinic.  6:02 AM:  I have discussed the diagnosis/risks/treatment options with the patient and family.  Evaluation and diagnostic testing in the emergency department does not suggest an emergent condition requiring admission or immediate intervention beyond what has been performed at this time.  They will follow up with PCP. We also  discussed returning to the ED immediately if new or worsening sx occur. We discussed the sx which are most  concerning (e.g., sudden worsening pain, fever, inability to tolerate by mouth) that necessitate immediate return. Medications administered to the patient during their visit and any new prescriptions provided to the patient are listed below.  Medications given during this visit Medications  acetaminophen  (TYLENOL ) tablet 650 mg (650 mg Oral Given 06/04/24 2249)  sodium chloride  0.9 % bolus 1,000 mL (0 mLs Intravenous Stopped 06/05/24 0202)  iohexol  (OMNIPAQUE ) 350 MG/ML injection 100 mL (100 mLs Intravenous Contrast Given 06/05/24 0232)     The patient appears reasonably screen and/or stabilized for discharge and I doubt any other medical condition or other [facility] requiring further screening, evaluation, or treatment in the ED at this time prior to discharge.       Final diagnoses:  Acute bilateral back pain, unspecified back location    ED Discharge Orders          Ordered    diclofenac Sodium (VOLTAREN) 1 % GEL  4 times daily        06/05/24 0559               Jaheem Hedgepath, DO 06/05/24 0602  "

## 2024-06-06 ENCOUNTER — Ambulatory Visit: Payer: Self-pay

## 2024-06-06 ENCOUNTER — Other Ambulatory Visit: Payer: Self-pay

## 2024-06-06 ENCOUNTER — Emergency Department (HOSPITAL_BASED_OUTPATIENT_CLINIC_OR_DEPARTMENT_OTHER)
Admission: EM | Admit: 2024-06-06 | Discharge: 2024-06-06 | Disposition: A | Discharge status: Home / Self Care | Attending: Emergency Medicine | Admitting: Emergency Medicine

## 2024-06-06 ENCOUNTER — Other Ambulatory Visit: Payer: Self-pay | Admitting: Family Medicine

## 2024-06-06 ENCOUNTER — Encounter (HOSPITAL_BASED_OUTPATIENT_CLINIC_OR_DEPARTMENT_OTHER): Payer: Self-pay

## 2024-06-06 DIAGNOSIS — Z79899 Other long term (current) drug therapy: Secondary | ICD-10-CM | POA: Insufficient documentation

## 2024-06-06 DIAGNOSIS — E1169 Type 2 diabetes mellitus with other specified complication: Secondary | ICD-10-CM

## 2024-06-06 DIAGNOSIS — M546 Pain in thoracic spine: Secondary | ICD-10-CM | POA: Insufficient documentation

## 2024-06-06 DIAGNOSIS — I10 Essential (primary) hypertension: Secondary | ICD-10-CM

## 2024-06-06 DIAGNOSIS — Z7982 Long term (current) use of aspirin: Secondary | ICD-10-CM | POA: Insufficient documentation

## 2024-06-06 LAB — CBC
HCT: 47.4 % (ref 39.0–52.0)
Hemoglobin: 15.6 g/dL (ref 13.0–17.0)
MCH: 29.9 pg (ref 26.0–34.0)
MCHC: 32.9 g/dL (ref 30.0–36.0)
MCV: 91 fL (ref 80.0–100.0)
Platelets: 151 K/uL (ref 150–400)
RBC: 5.21 MIL/uL (ref 4.22–5.81)
RDW: 14.8 % (ref 11.5–15.5)
WBC: 14.3 K/uL — ABNORMAL HIGH (ref 4.0–10.5)
nRBC: 0.1 % (ref 0.0–0.2)

## 2024-06-06 LAB — COMPREHENSIVE METABOLIC PANEL WITH GFR
ALT: 14 U/L (ref 0–44)
AST: 19 U/L (ref 15–41)
Albumin: 3.9 g/dL (ref 3.5–5.0)
Alkaline Phosphatase: 105 U/L (ref 38–126)
Anion gap: 15 (ref 5–15)
BUN: 41 mg/dL — ABNORMAL HIGH (ref 8–23)
CO2: 26 mmol/L (ref 22–32)
Calcium: 9.8 mg/dL (ref 8.9–10.3)
Chloride: 102 mmol/L (ref 98–111)
Creatinine, Ser: 1.71 mg/dL — ABNORMAL HIGH (ref 0.61–1.24)
GFR, Estimated: 38 mL/min — ABNORMAL LOW
Glucose, Bld: 147 mg/dL — ABNORMAL HIGH (ref 70–99)
Potassium: 3.6 mmol/L (ref 3.5–5.1)
Sodium: 143 mmol/L (ref 135–145)
Total Bilirubin: 1.7 mg/dL — ABNORMAL HIGH (ref 0.0–1.2)
Total Protein: 7.1 g/dL (ref 6.5–8.1)

## 2024-06-06 LAB — LIPASE, BLOOD: Lipase: <10 U/L — ABNORMAL LOW (ref 11–51)

## 2024-06-06 MED ORDER — LIDOCAINE 5 % EX PTCH
1.0000 | MEDICATED_PATCH | CUTANEOUS | Status: DC
  Administered 2024-06-06: 1 via TRANSDERMAL
  Filled 2024-06-06: qty 1

## 2024-06-06 MED ORDER — CYCLOBENZAPRINE HCL 5 MG PO TABS
5.0000 mg | ORAL_TABLET | Freq: Once | ORAL | Status: AC
  Administered 2024-06-06: 5 mg via ORAL
  Filled 2024-06-06: qty 1

## 2024-06-06 MED ORDER — LIDOCAINE 5 % EX PTCH: 1.0000 | MEDICATED_PATCH | CUTANEOUS | 0 refills | Status: AC

## 2024-06-06 MED ORDER — CYCLOBENZAPRINE HCL 5 MG PO TABS: 5.0000 mg | ORAL_TABLET | Freq: Two times a day (BID) | ORAL | 0 refills | Status: DC | PRN

## 2024-06-06 NOTE — ED Triage Notes (Signed)
 Pt reports abd and back pain x5 days ago. Pt seen at [facility] for evaluation. Pt prescribed topical pain med. Pt son reports pain has increased.

## 2024-06-06 NOTE — ED Provider Notes (Signed)
 " [place] EMERGENCY DEPARTMENT AT [facility] Provider Note   CSN: [id number] Arrival date & time: 06/06/24  1431     Patient presents with: Back Pain and Abdominal Pain   LUVERN MCISAAC is a 89 y.o. male.   Patient returns to ED after being evaluated 12/29 for pain near the right scapula, radiating to right abdomen, for persistent symptoms that are somewhat worse. No interim development of fever. No cough. He reports the back pain is worse when taking a deep breath but denies SOB. No rash. No nausea or vomiting. Son reports his appetite is decreased and patient acknowledges his appetite is absent. No recent history of fall or injury.   The history is provided by the patient and a relative. No language interpreter was used.  Back Pain Associated symptoms: abdominal pain   Abdominal Pain      Prior to Admission medications  Medication Sig Start Date End Date Taking? Authorizing Provider  amLODipine  (NORVASC ) 5 MG tablet Take by mouth. 05/24/24  Yes [provider]  cyclobenzaprine (FLEXERIL) 5 MG tablet Take 1 tablet (5 mg total) by mouth 2 (two) times daily as needed for muscle spasms. 06/06/24  Yes Ayshia Gramlich, Margit, PA-C  lidocaine  (LIDODERM ) 5 % Place 1 patch onto the skin daily. Remove & Discard patch within 12 hours or as directed by MD 06/06/24  Yes Odell Margit, PA-C  acetaminophen  (TYLENOL ) 500 MG tablet Take 2 tablets (1,000 mg total) by mouth every 6 (six) hours as needed. 06/26/22   [name], MD  Alcohol  Swabs  PADS Use as directed once a day 11/10/20   [name], Jamee SAUNDERS, DO  alendronate  (FOSAMAX ) 70 MG tablet TAKE 1 TABLET ONCE WEEKLY *TAKE WITH A FULL GLASS OF WATER ON AN EMPTY STOMACH* 05/16/22   Rice, [name] ORN, MD  aspirin 81 MG tablet Take 81 mg by mouth daily.    [provider]  atorvastatin  (LIPITOR) 20 MG tablet TAKE 1 TABLET EVERY DAY 06/06/24   [name], [name], DO  Blood Glucose Calibration (TRUE METRIX LEVEL 1)  Low SOLN USE AS DIRECTED 03/19/20   [name], [name] [name], MD  Blood Glucose Calibration (TRUE METRIX LEVEL 3) High SOLN Use to check controls on glucometer strips every 30 days or with each new bottle of strips (whichever comes first). 01/24/20   [name] Jamee R, DO  Blood Glucose Monitoring Suppl (TRUE METRIX METER) w/Device KIT USE AS DIRECTED ONCE A DAY 08/14/20   [name], [name], MD  brimonidine (ALPHAGAN) 0.2 % ophthalmic solution SMARTSIG:In Eye(s)    [provider]  brimonidine-timolol (COMBIGAN) 0.2-0.5 % ophthalmic solution Place 1 drop into both eyes 2 (two) times daily.    [provider]  COLLAGEN PO Take by mouth. 1 scoop daily    [provider]  diclofenac Sodium (VOLTAREN) 1 % GEL Apply 4 g topically 4 (four) times daily. 06/05/24   Emil Share, DO  dorzolamide-timolol (COSOPT) 2-0.5 % ophthalmic solution SMARTSIG:In Eye(s)    [provider]  empagliflozin  (JARDIANCE ) 25 MG TABS tablet TAKE 1 TABLET EVERY DAY BEFORE BREAKFAST 03/07/24   [name], [name], MD  fish oil-omega-3 fatty acids 1000 MG capsule Take 2 g by mouth daily.    [provider]  glucose blood (TRUE METRIX BLOOD GLUCOSE TEST) test strip TEST BLOOD SUGAR ONE TIME DAILY 08/17/23   [name], [name], DO  insulin  glargine (LANTUS  SOLOSTAR) 100 UNIT/ML Solostar Pen Inject 30 Units into the skin  daily. 04/13/24   [name], [name], MD  Insulin  Pen Needle 32G X 4 MM MISC 1 Device by Does not apply route daily in the afternoon. 08/12/23   [name], [name], MD  latanoprost (XALATAN) 0.005 % ophthalmic solution SMARTSIG:In Eye(s) 03/17/23   [provider]  linagliptin  (TRADJENTA ) 5 MG TABS tablet Take 1 tablet (5 mg total) by mouth daily. 12/12/23   [name], [name], MD  losartan -hydrochlorothiazide (HYZAAR) 100-25 MG tablet TAKE 1 TABLET EVERY DAY 06/06/24   [name], [name], DO  memantine  (NAMENDA ) 5 MG  tablet Take 2 tabs at night and 1 tab in the morning 12/16/23   [name], [name], PA-C  metoprolol  succinate (TOPROL -XL) 50 MG 24 hr tablet TAKE 1 TABLET EVERY DAY WITH OR IMMEDIATELY FOLLOWING A MEAL 05/14/24   [name], [name], DO  Multiple Vitamin (MULTIVITAMIN) tablet Take 1 tablet by mouth daily.    [provider]  tamsulosin  (FLOMAX ) 0.4 MG CAPS capsule TAKE 1 CAPSULE EVERY DAY AFTER SUPPER 05/14/24   [name], [name], DO  TRUEplus Lancets 33G MISC Use to check blood sugar once a day.  DX  E11.9 11/10/20   [name], [name], DO  vitamin B-12 (CYANOCOBALAMIN ) 100 MCG tablet Take 100 mcg by mouth daily.    [provider]    Allergies: Patient has no known allergies.    Review of Systems  Gastrointestinal:  Positive for abdominal pain.  Musculoskeletal:  Positive for back pain.    Updated Vital Signs BP (!) 195/108   Pulse 81   Temp (!) 97.3 F (36.3 C) (Temporal)   Resp 16   Ht 5' 4 (1.626 m)   Wt 56.7 kg   SpO2 97%   BMI 21.46 kg/m   Physical Exam Vitals and nursing note reviewed.  Constitutional:      Appearance: He is well-developed. He is not toxic-appearing.  HENT:     Head: Normocephalic.  Cardiovascular:     Rate and Rhythm: Normal rate and regular rhythm.     Heart sounds: No murmur heard. Pulmonary:     Effort: Pulmonary effort is normal.     Breath sounds: Normal breath sounds. No wheezing, rhonchi or rales.  Abdominal:     General: Bowel sounds are normal.     Palpations: Abdomen is soft.     Tenderness: There is no abdominal tenderness. There is no guarding or rebound.  Musculoskeletal:        General: Normal range of motion.     Cervical back: Normal range of motion and neck supple.       Back:     Comments: Focal tenderness to right parascapular back. No midline tenderness. NO swelling. No rash or redness.   Skin:    General: Skin is warm and dry.  Neurological:     General: No focal deficit present.     Mental  Status: He is alert and oriented to person, place, and time.     (all labs ordered are listed, but only abnormal results are displayed) Labs Reviewed  LIPASE, BLOOD - Abnormal; Notable for the following components:      Result Value   Lipase <10 (*)    All other components within normal limits  COMPREHENSIVE METABOLIC PANEL WITH GFR - Abnormal; Notable for the following components:   Glucose, Bld 147 (*)    BUN 41 (*)    Creatinine, Ser 1.71 (*)    Total Bilirubin 1.7 (*)  GFR, Estimated 38 (*)    All other components within normal limits  CBC - Abnormal; Notable for the following components:   WBC 14.3 (*)    All other components within normal limits  URINALYSIS, ROUTINE W REFLEX MICROSCOPIC    EKG: None  Radiology: CT Angio Chest/Abd/Pel for Dissection W and/or Wo Contrast Result Date: 06/05/2024 EXAM: CTA CHEST, ABDOMEN AND PELVIS WITHOUT AND WITH CONTRAST 06/05/2024 02:32:34 AM TECHNIQUE: CTA of the chest was performed without and with the administration of 100 mL of iohexol  (OMNIPAQUE ) 350 MG/ML injection. CTA of the abdomen and pelvis was performed without and with the administration of 100 mL of iohexol  (OMNIPAQUE ) 350 MG/ML injection. Multiplanar reformatted images are provided for review. MIP images are provided for review. Automated exposure control, iterative reconstruction, and/or weight based adjustment of the mA/kV was utilized to reduce the radiation dose to as low as reasonably achievable. COMPARISON: 03/20/2019 CLINICAL HISTORY: Acute aortic syndrome (AAS) suspected. FINDINGS: VASCULATURE: AORTA: Calcific aortic atherosclerosis. No aortic aneurysm or dissection. PULMONARY ARTERIES: No pulmonary embolism with the limits of this exam. GREAT VESSELS OF AORTIC ARCH: No acute finding. No dissection. No arterial occlusion or significant stenosis. CELIAC TRUNK: No acute finding. No occlusion or significant stenosis. SUPERIOR MESENTERIC ARTERY: No acute finding. No occlusion  or significant stenosis. 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 RENAL ARTERIES: No acute finding. No occlusion or significant stenosis. ILIAC ARTERIES: Atherosclerosis of the internal, external, and common iliac arteries without high-grade stenosis. CHEST: MEDIASTINUM: No mediastinal lymphadenopathy. The heart and pericardium demonstrate no acute abnormality. Coronary artery calcifications. LUNGS AND PLEURA: Left basilar atelectasis. No focal consolidation or pulmonary edema. No evidence of pleural effusion or pneumothorax. THORACIC BONES AND SOFT TISSUES: No acute bone or soft tissue abnormality. ABDOMEN AND PELVIS: LIVER: The liver is unremarkable. GALLBLADDER AND BILE DUCTS: Gallbladder is unremarkable. No biliary ductal dilatation. SPLEEN: The spleen is unremarkable. PANCREAS: The pancreas is unremarkable. ADRENAL GLANDS: Bilateral adrenal glands demonstrate no acute abnormality. KIDNEYS, URETERS AND BLADDER: Multiple subcentimeter low attenuation foci of the left kidney, too small to be characterized definitively. No stones in the kidneys or ureters. No hydronephrosis. No perinephric or periureteral stranding. Urinary bladder is unremarkable. GI AND BOWEL: Stomach and duodenal sweep demonstrate no acute abnormality. There is no bowel obstruction. No abnormal bowel wall thickening or distension. REPRODUCTIVE: Reproductive organs are unremarkable. PERITONEUM AND RETROPERITONEUM: No ascites or free air. LYMPH NODES: No lymphadenopathy. ABDOMINAL BONES AND SOFT TISSUES: No acute abnormality of the bones. No acute soft tissue abnormality. IMPRESSION: 1. No aortic aneurysm or dissection. 2. 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 3. Atherosclerosis of the internal, external, and common iliac arteries without high-grade stenosis. 4. Multiple subcentimeter low attenuation foci of the left kidney, too small to be characterized definitively.  Electronically signed by: Franky Stanford MD 06/05/2024 03:48 AM EST RP Workstation: [id number]   DG Chest 2 View Result Date: 06/05/2024 EXAM: 2 VIEW(S) XRAY OF THE CHEST 06/04/2024 10:13:33 PM COMPARISON: Comparison 06/26/2022. CLINICAL HISTORY: Chest pain. FINDINGS: LUNGS AND PLEURA: Underinflation. Elevated left hemidiaphragm. Basilar atelectasis. No pleural effusion. No pneumothorax. HEART AND MEDIASTINUM: Calcified aorta. BONES AND SOFT TISSUES: Degenerative changes in spine. IMPRESSION: 1. No acute cardiopulmonary findings. 2. Underinflation with elevated left hemidiaphragm and basilar atelectasis. 3. Atherosclerotic calcification of the aorta. Electronically signed by: Franky Stanford MD 06/05/2024 12:54 AM EST RP Workstation: [id number]     Procedures   Medications Ordered in the ED  lidocaine  (LIDODERM ) 5 % 1 patch (1 patch Transdermal Patch Applied 06/06/24 1834)  cyclobenzaprine (FLEXERIL) tablet 5 mg (5 mg Oral Given 06/06/24 1833)    Clinical Course as of 06/06/24 2029  Wed Jun 06, 2024  1816 Patient to ED for progressively worsening pain in the right scapula without fall or injury. Seen 12/29. Chart reviewed. WBC 12/29 22, improved today to 14.3. Cr 1.93 12/29 improved today to 1.71 which is felt to be baseline. CTA chest was performed 12/20 and rule out dissection. There was mesenteric artery stenosis which was not felt contributory. Diagnosed with MSK pain. Taking Tylenol  without relief. Will provide 5 mg Flexeril and lidocaine  patch to attempt better pain control.  [SU]  1924 Patient noted to have a decreased oxygen saturation on RA at rest of 88%. 1L O2 placed with improvement. Will evaluate after pain control. Incentive spirometer ordered as well. The patient has been seen by Dr. Lenor.  [SU]  1947 Recheck: Still have pain with movement. Has used the incentive spirometer and O2 level improved, 92-97%. Attempted to sit him up and he reported significant pain.  [SU]  2028 O2 level  remains steady after further observation. Discussed admission vs discharge home with the patient and his son whom he lives with. The patient prefers to be discharged home. Will prescribe lidocaine  patches, 5 mg Flexeril. Encouraged to use incentive spirometer every 2 hours, apply heat. Return precautions discussed.  [SU]    Clinical Course User Index [SU] Odell Balls, PA-C                                 Medical Decision Making Amount and/or Complexity of Data Reviewed Labs: ordered.  Risk Prescription drug management.        Final diagnoses:  Acute right-sided thoracic back pain    ED Discharge Orders          Ordered    cyclobenzaprine (FLEXERIL) 5 MG tablet  2 times daily PRN        06/06/24 2025    lidocaine  (LIDODERM ) 5 %  Every 24 hours        06/06/24 2025               Odell Balls, PA-C 06/06/24 2029    Lenor Hollering, MD 06/06/24 2124  "

## 2024-06-06 NOTE — Telephone Encounter (Signed)
 FYI Only or Action Required?: FYI only for provider: ED advised.  Patient was last seen in primary care on 04/17/2024 by [name], Jamee SAUNDERS, DO.  Called Nurse Triage reporting Back Pain.  Symptoms began several days ago.  Interventions attempted: Rest, hydration, or home remedies.  Symptoms are: gradually worsening.  Triage Disposition: See HCP Within 4 Hours (Or PCP Triage)  Patient/caregiver understands and will follow disposition?: Yes  Copied from CRM #[id number]. Topic: Clinical - Red Word Triage >> Jun 06, 2024 11:55 AM Eva FALCON wrote: Red Word that prompted transfer to Nurse Triage: Son says dad had ER visit Monday night, scheduled a hospital follow up for 1/6 but is requesting a sooner appointment, but is having severe back pain, he needs help getting up and doing things, which is not like him. Reason for Disposition  [1] SEVERE back pain (e.g., excruciating, unable to do any normal activities) AND [2] not improved 2 hours after pain medicine  Answer Assessment - Initial Assessment Questions Patient's son called with concerns for severe back pain. Patient was seen in the ED on Monday-had an extensive visit. Son reports patient seems to be in more pain then he was on Monday. Recommended patient be seen in the ED again for evaluation.   1. ONSET: When did the pain begin? (e.g., minutes, hours, days)     Started 06/02/2024 2. LOCATION: Where does it hurt? (upper, mid or lower back)     Shoulder blades down to lower back 3. SEVERITY: How bad is the pain?  (e.g., Scale 1-10; mild, moderate, or severe)     10 4. PATTERN: Is the pain constant? (e.g., yes, no; constant, intermittent)      constant 5. RADIATION: Does the pain shoot into your legs or somewhere else?     no 6. CAUSE:  What do you think is causing the back pain?      unsure 7. BACK OVERUSE:  Any recent lifting of heavy objects, strenuous work or exercise?     no 8. MEDICINES: What have you taken so  far for the pain? (e.g., nothing, acetaminophen , NSAIDS)     Tylenol , Voltaren gel 9. NEUROLOGIC SYMPTOMS: Do you have any weakness, numbness, or problems with bowel/bladder control?     Weakness  10. OTHER SYMPTOMS: Do you have any other symptoms? (e.g., fever, abdomen pain, burning with urination, blood in urine)       no  Protocols used: Back Pain-A-AH

## 2024-06-06 NOTE — Discharge Instructions (Addendum)
 As we discussed, you can be discharged home to manage the pain felt to be muscular in nature. Use the medications as prescribed. You can take Tylenol  in addition to these medications. Apply heat and rest as much as possible. Use the incentive spirometer every 2 hours. And return to the ED if you are unable to manage your pain at home.

## 2024-06-08 NOTE — Telephone Encounter (Signed)
 Pt went back to the ED on 12/31

## 2024-06-12 ENCOUNTER — Ambulatory Visit: Admitting: Family Medicine

## 2024-06-12 ENCOUNTER — Ambulatory Visit: Payer: Self-pay | Admitting: Family Medicine

## 2024-06-12 ENCOUNTER — Ambulatory Visit (HOSPITAL_BASED_OUTPATIENT_CLINIC_OR_DEPARTMENT_OTHER)
Admission: RE | Admit: 2024-06-12 | Discharge: 2024-06-12 | Disposition: A | Discharge status: Home / Self Care | Source: Ambulatory Visit | Attending: Family Medicine | Admitting: Family Medicine

## 2024-06-12 ENCOUNTER — Encounter: Payer: Self-pay | Admitting: Family Medicine

## 2024-06-12 VITALS — BP 160/82 | HR 98 | Temp 97.8°F | Resp 16 | Ht 64.0 in | Wt 130.6 lb

## 2024-06-12 DIAGNOSIS — M546 Pain in thoracic spine: Secondary | ICD-10-CM | POA: Insufficient documentation

## 2024-06-12 DIAGNOSIS — I10 Essential (primary) hypertension: Secondary | ICD-10-CM | POA: Diagnosis not present

## 2024-06-12 DIAGNOSIS — R2681 Unsteadiness on feet: Secondary | ICD-10-CM

## 2024-06-12 DIAGNOSIS — G309 Alzheimer's disease, unspecified: Secondary | ICD-10-CM | POA: Diagnosis not present

## 2024-06-12 DIAGNOSIS — F0280 Dementia in other diseases classified elsewhere without behavioral disturbance: Secondary | ICD-10-CM

## 2024-06-12 DIAGNOSIS — K551 Chronic vascular disorders of intestine: Secondary | ICD-10-CM

## 2024-06-12 DIAGNOSIS — N1831 Chronic kidney disease, stage 3a: Secondary | ICD-10-CM | POA: Diagnosis not present

## 2024-06-12 DIAGNOSIS — R2689 Other abnormalities of gait and mobility: Secondary | ICD-10-CM

## 2024-06-12 DIAGNOSIS — E1169 Type 2 diabetes mellitus with other specified complication: Secondary | ICD-10-CM

## 2024-06-12 DIAGNOSIS — S0990XA Unspecified injury of head, initial encounter: Secondary | ICD-10-CM

## 2024-06-12 DIAGNOSIS — E1122 Type 2 diabetes mellitus with diabetic chronic kidney disease: Secondary | ICD-10-CM | POA: Diagnosis not present

## 2024-06-12 DIAGNOSIS — E785 Hyperlipidemia, unspecified: Secondary | ICD-10-CM

## 2024-06-12 DIAGNOSIS — S22000A Wedge compression fracture of unspecified thoracic vertebra, initial encounter for closed fracture: Secondary | ICD-10-CM

## 2024-06-12 MED ORDER — NONFORMULARY OR COMPOUNDED ITEM: 0 refills | Status: AC

## 2024-06-12 MED ORDER — CYCLOBENZAPRINE HCL 5 MG PO TABS: 5.0000 mg | ORAL_TABLET | Freq: Two times a day (BID) | ORAL | 2 refills | Status: AC | PRN

## 2024-06-12 MED ORDER — NONFORMULARY OR COMPOUNDED ITEM: 0 refills | Status: DC

## 2024-06-12 NOTE — Progress Notes (Addendum)
 "   Subjective:    Patient ID: [name], male    DOB: [birth year], 89 y.o.   MRN: [id number]  Chief Complaint  Patient presents with   ED follow up    HPI Patient is in today for f/u ER.  Discussed the use of AI scribe software for clinical note transcription with the patient, who gave verbal consent to proceed.  History of Present Illness [name] is a 89 year old male who presents with recent falls and confusion. He is accompanied by his brother, Chyrl, who lives with him.  He experienced a fall from the couch this morning. He has been having balance issues, which he describes as a new problem. He also experienced a delusion this afternoon, seeing people in the fireplace, which was actually a reflection. He has a history of a fall a couple of weeks ago where he hit his shoulder and neck, but not his head. He has been to the emergency room twice recently, once for back pain and once for lack of relief from the initial visit. He has a cut on his thumb from a previous fall.  He reports ongoing back pain, primarily across the shoulder blades, which he describes as improving. The pain was rated as a 5/10 two days ago and is now a 3/10. He is currently using muscle relaxers and lidocaine  patches, typically administered in the morning, and occasionally takes Tylenol . His lower back is not currently painful.  He has experienced confusion, which was noted during his first emergency room visit, but it is unclear if a CT scan of the head was performed. He has a history of mesenteric artery stenosis, but no abdominal pain or weight loss, and he reports eating smaller meals. His weight is currently 130 pounds, which he states is his running weight.  He mentions a new raspy voice and has a follow-up appointment with a neurologist scheduled in two days.    Past Medical History:  Diagnosis Date   Anemia, unspecified 10/27/2009   BPH with urinary obstruction 12/13/2012   CAD (coronary artery  disease)    LHC 03/25/11 by Dr. Wonda:  pLAD 99%, oCFX 20-30%, pOM1 40%, dAVCFX 20-30%.  EF was normal on nuclear study.  He was treated with a Promus DES to his pLAD.    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/2023   Monoclonal gammopathy 04/06/2019   Multiple closed fractures of ribs of left side 03/21/2019   Multiple myeloma not having achieved remission 08/13/2022   Osteoporosis 03/21/2019   Peripheral vascular disorder due to diabetes mellitus 08/13/2022   Personal history of colonic polyps 1996   villous adenoma   Primary open angle glaucoma (POAG) of left eye, mild stage 02/04/2019   Primary open angle glaucoma (POAG) of right eye, moderate stage 02/04/2019   Prostate cancer 02/15/2017   Stage 3b chronic kidney disease 02/06/2020   Type II diabetes mellitus 07/04/2006   Vitamin D  deficiency 01/28/2021    Past Surgical History:  Procedure Laterality Date   CARDIAC CATHETERIZATION  03/25/2011   cataract Bilateral 2010   corenea implants  june and sept 2018   dr don[facility]   CORONARY STENT PLACEMENT  03/25/2011   RADIOACTIVE SEED IMPLANT N/A 05/06/2017   Procedure: RADIOACTIVE SEED IMPLANT/BRACHYTHERAPY IMPLANT;  Surgeon: Ottelin, Mark, MD;  Location[facility];  Service: Urology;  Laterality: N/A;   SPACE OAR INSTILLATION N/A 05/06/2017   Procedure: SPACE OAR INSTILLATION;  Surgeon: Ottelin, Mark, MD;  Location: [facility] [place];  Service: Urology;  Laterality: N/A;    Family History  Problem Relation Age of Onset   Stomach cancer Mother 32   Coronary artery disease Father 38       deceased   Healthy Son    Healthy  Son    Healthy Son     Social History   Socioeconomic History   Marital status: Widowed    Spouse name: Not on file   Number of children: Not on file   Years of education: 14   Highest education level: Associate degree: academic program  Occupational History   Occupation: Retired  Tobacco Use   Smoking status: Former    Current packs/day: 0.00    Average packs/day: 0.3 packs/day for 3.0 years (0.8 ttl pk-yrs)    Types: Cigarettes    Start date: 06/08/1955    Quit date: 06/07/1958    Years since quitting: 66.0   Smokeless tobacco: Never  Vaping Use   Vaping status: Never Used  Substance and Sexual Activity   Alcohol  use: Not Currently   Drug use: No   Sexual activity: Not Currently  Other Topics Concern   Not on file  Social History Narrative   Right   Lives with son    Retired   Two story home   Caffeine  2 cup a day   Still drives   Social Drivers of Health   Tobacco Use: Medium Risk (06/14/2024)   Patient History    Smoking Tobacco Use: Former    Smokeless Tobacco Use: Never    Passive Exposure: Not on Actuary Strain: Low Risk (04/16/2024)   Overall Financial Resource Strain (CARDIA)    Difficulty of Paying Living Expenses: Not hard at all  Food Insecurity: No Food Insecurity (04/16/2024)   Epic    Worried About Programme Researcher, Broadcasting/film/video in the Last Year: Never true    Ran Out of Food in the Last Year: Never true  Transportation Needs: No Transportation Needs (04/16/2024)   Epic    Lack of Transportation (Medical): No    Lack of Transportation (Non-Medical): No  Physical Activity: Insufficiently Active (04/16/2024)   Exercise Vital Sign    Days of Exercise per Week: 2 days    Minutes of Exercise per Session: 20 min  Stress: No Stress Concern Present (04/16/2024)   [employer] of Occupational Health - Occupational Stress Questionnaire    Feeling of Stress: Not at all  Social Connections: Moderately Integrated (04/16/2024)   Social  Connection and Isolation Panel    Frequency of Communication with Friends and Family: More than three times a week    Frequency of Social Gatherings with Friends and Family: More than three times a week    Attends Religious Services: More than 4 times per year    Active Member of [employer] or Organizations: Yes    Attends Banker Meetings: More than 4 times per year    Marital Status: Widowed  Intimate Partner Violence: Not At Risk (09/17/2022)   Humiliation, Afraid, Rape, and Kick questionnaire    Fear of Current or Ex-Partner: No    Emotionally Abused: No  Physically Abused: No    Sexually Abused: No  Depression (PHQ2-9): Low Risk (11/15/2023)   Depression (PHQ2-9)    PHQ-2 Score: 0  Alcohol  Screen: Low Risk (04/16/2024)   Alcohol  Screen    Last Alcohol  Screening Score (AUDIT): 1  Housing: Low Risk (04/16/2024)   Epic    Unable to Pay for Housing in the Last Year: No    Number of Times Moved in the Last Year: 0    Homeless in the Last Year: No  Utilities: Not At Risk (11/15/2023)   AHC Utilities    Threatened with loss of utilities: No  Health Literacy: Adequate Health Literacy (11/15/2023)   B1300 Health Literacy    Frequency of need for help with medical instructions: Rarely    Outpatient Medications Prior to Visit  Medication Sig Dispense Refill   acetaminophen  (TYLENOL ) 500 MG tablet Take 2 tablets (1,000 mg total) by mouth every 6 (six) hours as needed. 60 tablet 0   Alcohol  Swabs  PADS Use as directed once a day 100 each 1   alendronate  (FOSAMAX ) 70 MG tablet TAKE 1 TABLET ONCE WEEKLY *TAKE WITH A FULL GLASS OF WATER ON AN EMPTY STOMACH* 12 tablet 0   amLODipine  (NORVASC ) 5 MG tablet Take by mouth.     aspirin 81 MG tablet Take 81 mg by mouth daily.     atorvastatin  (LIPITOR) 20 MG tablet TAKE 1 TABLET EVERY DAY 90 tablet 3   Blood Glucose Calibration (TRUE METRIX LEVEL 1) Low SOLN USE AS DIRECTED 1 each 3   Blood Glucose Calibration (TRUE METRIX LEVEL 3) High  SOLN Use to check controls on glucometer strips every 30 days or with each new bottle of strips (whichever comes first). 3 each 1   Blood Glucose Monitoring Suppl (TRUE METRIX METER) w/Device KIT USE AS DIRECTED ONCE A DAY 1 kit 0   brimonidine (ALPHAGAN) 0.2 % ophthalmic solution SMARTSIG:In Eye(s)     brimonidine-timolol (COMBIGAN) 0.2-0.5 % ophthalmic solution Place 1 drop into both eyes 2 (two) times daily.     COLLAGEN PO Take by mouth. 1 scoop daily     diclofenac  Sodium (VOLTAREN ) 1 % GEL Apply 4 g topically 4 (four) times daily. 100 g 0   dorzolamide-timolol (COSOPT) 2-0.5 % ophthalmic solution SMARTSIG:In Eye(s)     empagliflozin  (JARDIANCE ) 25 MG TABS tablet TAKE 1 TABLET EVERY DAY BEFORE BREAKFAST 90 tablet 1   fish oil-omega-3 fatty acids 1000 MG capsule Take 2 g by mouth daily.     glucose blood (TRUE METRIX BLOOD GLUCOSE TEST) test strip TEST BLOOD SUGAR ONE TIME DAILY 100 strip 3   insulin  glargine (LANTUS  SOLOSTAR) 100 UNIT/ML Solostar Pen Inject 30 Units into the skin daily. 30 mL 4   Insulin  Pen Needle 32G X 4 MM MISC 1 Device by Does not apply route daily in the afternoon. 100 each 3   latanoprost (XALATAN) 0.005 % ophthalmic solution SMARTSIG:In Eye(s)     lidocaine  (LIDODERM ) 5 % Place 1 patch onto the skin daily. Remove & Discard patch within 12 hours or as directed by MD 10 patch 0   linagliptin  (TRADJENTA ) 5 MG TABS tablet Take 1 tablet (5 mg total) by mouth daily. 90 tablet 3   losartan -hydrochlorothiazide (HYZAAR) 100-25 MG tablet TAKE 1 TABLET EVERY DAY 90 tablet 3   memantine  (NAMENDA ) 5 MG tablet Take 2 tabs at night and 1 tab in the morning 270 tablet 3   metoprolol  succinate (TOPROL -XL) 50 MG 24 hr tablet TAKE  1 TABLET EVERY DAY WITH OR IMMEDIATELY FOLLOWING A MEAL 90 tablet 3   Multiple Vitamin (MULTIVITAMIN) tablet Take 1 tablet by mouth daily.     tamsulosin  (FLOMAX ) 0.4 MG CAPS capsule TAKE 1 CAPSULE EVERY DAY AFTER SUPPER 90 capsule 3   TRUEplus Lancets 33G  MISC Use to check blood sugar once a day.  DX  E11.9 100 each 1   vitamin B-12 (CYANOCOBALAMIN ) 100 MCG tablet Take 100 mcg by mouth daily.     cyclobenzaprine  (FLEXERIL ) 5 MG tablet Take 1 tablet (5 mg total) by mouth 2 (two) times daily as needed for muscle spasms. 10 tablet 0   No facility-administered medications prior to visit.    No Known Allergies  Review of Systems  Constitutional:  Negative for fever and malaise/fatigue.  HENT:  Negative for congestion.   Eyes:  Negative for blurred vision.  Respiratory:  Negative for shortness of breath.   Cardiovascular:  Negative for chest pain, palpitations and leg swelling.  Gastrointestinal:  Negative for abdominal pain, blood in stool and nausea.  Genitourinary:  Negative for dysuria and frequency.  Musculoskeletal:  Positive for back pain. Negative for falls.  Skin:  Negative for rash.  Neurological:  Negative for dizziness, loss of consciousness and headaches.  Endo/Heme/Allergies:  Negative for environmental allergies.  Psychiatric/Behavioral:  Negative for depression. The patient is not nervous/anxious.        Objective:    Physical Exam Vitals and nursing note reviewed.  Constitutional:      General: He is not in acute distress.    Appearance: Normal appearance. He is well-developed.  HENT:     Head: Normocephalic and atraumatic.  Eyes:     General: No scleral icterus.       Right eye: No discharge.        Left eye: No discharge.  Cardiovascular:     Rate and Rhythm: Normal rate and regular rhythm.     Heart sounds: No murmur heard. Pulmonary:     Effort: Pulmonary effort is normal. No respiratory distress.     Breath sounds: Normal breath sounds.  Musculoskeletal:        General: Tenderness present. Normal range of motion.       Arms:     Cervical back: Normal range of motion and neck supple.     Thoracic back: Spasms and tenderness present.     Right lower leg: No edema.     Left lower leg: No edema.  Skin:     General: Skin is warm and dry.  Neurological:     Mental Status: He is alert and oriented to person, place, and time.  Psychiatric:        Mood and Affect: Mood normal.        Behavior: Behavior normal.        Thought Content: Thought content normal.        Judgment: Judgment normal.     BP (!) 160/82 (BP Location: Right Arm, Patient Position: Sitting, Cuff Size: Normal)   Pulse 98   Temp 97.8 F (36.6 C) (Oral)   Resp 16   Ht 5' 4 (1.626 m)   Wt 130 lb 9.6 oz (59.2 kg)   SpO2 98%   BMI 22.42 kg/m  Wt Readings from Last 3 Encounters:  06/17/24 135 lb (61.2 kg)  06/14/24 127 lb (57.6 kg)  06/12/24 130 lb 9.6 oz (59.2 kg)    Diabetic Foot Exam - Simple   No data filed  Lab Results  Component Value Date   WBC 13.0 (H) 06/12/2024   HGB 13.7 06/12/2024   HCT 41.7 06/12/2024   PLT 224.0 06/12/2024   GLUCOSE 93 06/12/2024   CHOL 94 06/12/2024   TRIG 180.0 (H) 06/12/2024   HDL 24.50 (L) 06/12/2024   LDLCALC 34 06/12/2024   ALT 17 06/12/2024   AST 19 06/12/2024   NA 138 06/12/2024   K 3.4 (L) 06/12/2024   CL 99 06/12/2024   CREATININE 1.65 (H) 06/12/2024   BUN 36 (H) 06/12/2024   CO2 30 06/12/2024   TSH 5.69 (H) 06/12/2024   PSA 0.03 (L) 08/13/2022   INR 1.03 05/02/2017   HGBA1C 10.6 (A) 04/13/2024   MICROALBUR 21.5 (H) 02/16/2024    Lab Results  Component Value Date   TSH 5.69 (H) 06/12/2024   Lab Results  Component Value Date   WBC 13.0 (H) 06/12/2024   HGB 13.7 06/12/2024   HCT 41.7 06/12/2024   MCV 89.9 06/12/2024   PLT 224.0 06/12/2024   Lab Results  Component Value Date   NA 138 06/12/2024   K 3.4 (L) 06/12/2024   CO2 30 06/12/2024   GLUCOSE 93 06/12/2024   BUN 36 (H) 06/12/2024   CREATININE 1.65 (H) 06/12/2024   BILITOT 0.6 06/12/2024   ALKPHOS 123 (H) 06/12/2024   AST 19 06/12/2024   ALT 17 06/12/2024   PROT 6.4 06/12/2024   ALBUMIN 3.6 06/12/2024   CALCIUM  8.5 06/12/2024   ANIONGAP 15 06/06/2024   GFR 36.37 (L) 06/12/2024    Lab Results  Component Value Date   CHOL 94 06/12/2024   Lab Results  Component Value Date   HDL 24.50 (L) 06/12/2024   Lab Results  Component Value Date   LDLCALC 34 06/12/2024   Lab Results  Component Value Date   TRIG 180.0 (H) 06/12/2024   Lab Results  Component Value Date   CHOLHDL 4 06/12/2024   Lab Results  Component Value Date   HGBA1C 10.6 (A) 04/13/2024       Assessment & Plan:  Injury of head, initial encounter -     MR BRAIN WO CONTRAST; Future -     CBC with Differential/Platelet -     Comprehensive metabolic panel with GFR -     Lipid panel -     TSH  Essential hypertension -     CBC with Differential/Platelet -     Comprehensive metabolic panel with GFR -     Lipid panel -     TSH  Hyperlipidemia associated with type 2 diabetes mellitus (HCC) Assessment & Plan: Encourage heart healthy diet such as MIND or DASH diet, increase exercise, avoid trans fats, simple carbohydrates and processed foods, consider a krill or fish or flaxseed oil cap daily.     Unsteady gait -     Ambulatory referral to Physical Therapy  Balance disorder -     Ambulatory referral to Physical Therapy -     NONFORMULARY OR COMPOUNDED ITEM; Walker  #1  sig as directed due to frequent falls and balance issues  Dispense: 1 each; Refill: 0  Thoracic spine pain -     DG Thoracic Spine W/Swimmers; Future -     Cyclobenzaprine  HCl; Take 1 tablet (5 mg total) by mouth 2 (two) times daily as needed for muscle spasms.  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 There was no head injury during the recent fall. Concerns exist about the sudden onset of balance issues and falls. An MRI of the brain has been ordered to assess underlying causes. A walker has been prescribed for mobility support, and he has been referred to physical therapy for balance training.  Thoracic spine pain   He reports pain across the back near the shoulder blades, which has improved from 5 to 3 out of 10. Pain is managed with muscle relaxers and lidocaine  patches. No recent imaging of the spine has been done. An x-ray of the thoracic spine has been ordered to rule out compression fractures. Current pain management with muscle relaxers and lidocaine  patches will continue, and the muscle relaxers prescription has been refilled.  Acute confusion and delusions   He has had recent episodes of confusion and delusions, such as seeing people in the fireplace. There was no head injury during the recent fall. Previous ER visits noted confusion, but no head imaging was performed. A follow-up with neurology has been coordinated with PA Juma on Thursday.  Mesenteric artery stenosis   Severe mesenteric artery stenosis is noted in previous records. He has no current abdominal pain or symptoms related to the stenosis, and there is no recent weight loss or postprandial pain. Blood work has been ordered to assess the current status. A referral for further evaluation will be considered if symptoms develop.    Ah Bott R [name], DO  "

## 2024-06-12 NOTE — Progress Notes (Signed)
 "   Mild Dementia likely due to Alzheimer's disease   [name] is a very pleasant 89 y.o. 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 Patient is currently on memantine  5mg  in the morning, 10 mg at night tolerating well. This patient is accompanied in the office by his son  who supplements the history.  Previous records as well as any outside records available were reviewed prior to todays visit. Patient was last seen on 12/16/2023.  He was recently hospitalized with increased confusion with negative workup, however he is on multiple medications for pain and muscle relaxers due to back pain, which may be affecting his memory and to a certain extent healthy confusional state.  We discussed postponing increasing memantine  to 10 mg twice daily until her symptoms are relieved, son agrees.  Continue memantine  5 mg twice daily, side effects discussed   Recommend good control of cardiovascular risk factors especially A1c.   Continue to control mood as per PCP Follow up in  months   Discussed the use of AI scribe software for clinical note transcription with the patient, who gave verbal consent to proceed.  History of Present Illness [name] is a 89 year old male who presents with increased confusion and hallucinations. He is accompanied by his son, who is his primary caregiver.  He has been experiencing increased confusion and hallucinations over the past two weeks, characterized by seeing people who are not there and having conversations about nonsensical activities. His son notes that he has been more confused since a recent visit to  the ER, where an MRI of the brain showed no acute findings and other studies were negative. He is currently taking memantine  5 mg in the morning and 10 mg at night.  He is trying to increase it to 10 mg twice a day slowly, but was unable to do so given the above symptoms. Approximately a month and a half ago, he had significant unsteadiness, leading to the discontinuation of amlodipine . Since then, he has had a better sense of clarity. He has a history of a compressed disc and back strain, causing significant pain in the center of his back (negative CT angio of the chest) and a history of a compressed disc at T5.SABRA He uses lidocaine  patches and has been prescribed cyclobenzaprine  5 mg for muscle relaxation. His son reports increased confusion since starting these medications. He has been less active recently, attending the senior center and church less frequently due to his back pain.  He used to meet friends at [place] and participate in family activities but has been less able to do so since the onset of his pain. He has a history of glaucoma and sees his eye doctor regularly. He underwent a cornea transplant and has regular check-ups every three months.  His son and family members assist him with daily activities, including showering and moving around the house. He has been eating out nightly but is trying to eat healthier by avoiding sugary drinks and sweets. He has no headaches and denies any recent infections or strokes. He is not currently driving.    Neuropsych evaluation 03/01/2023 briefly, results suggested severe impairment surrounding all aspects  of learning and memory. An additional impairment was exhibited across semantic fluency, while performance variability was exhibited across processing speed and executive functioning. Regarding the cause of his mild dementia presentation, I do have concerns surrounding a neurodegenerative illness, namely Alzheimer's disease. Across memory testing, Mr. Lince  did not benefit from repeated information across learning trials and was fully amnestic (i.e., 0% retention) across both verbal memory tasks after a brief delay. He only exhibited 13% retention across the remaining visual memory task. Performances across yes/no recognition trials were consistently poor. Taken together, this suggests evidence for rapid forgetting and a pronounced storage impairment, both of which are the hallmark testing patterns associated with this illness. Further impairment surrounding semantic fluency and executive variability would represent typical disease progression. Intact confrontation naming is encouraging. However, concerns surrounding a dementia due to Alzheimer's disease presentation remain.     MRI of the brain personally reviewed was without acute findings, moderate volume loss, minimal chronic microvascular changes       Initial visit 08/31/2022 How long did patient have memory difficulties? For the last 2 year he was losing his wallet, having difficulty remembering recent conversations and people names . Since his fall in January, his memory worsened, forgetting within minutes, not knowing where the money is, date, remembering appointments and simple tasks. When I asked him Who is your MD, he opened the medicine cabinet to find out the name  repeats oneself?  Endorsed, for the last 1.5 years, worse recently  Disoriented when walking into a room?  Patient denies except occasionally not remembering what patient came to the room for    Leaving objects in unusual places?  he does misplace things.   Wandering behavior? denies   Any personality changes since last visit? denies   Any history of depression?:  denies   Hallucinations or paranoia?  denies   Seizures? denies    Any sleep changes?  Denies vivid dreams, REM behavior or sleepwalking . Takes Mg to help him relax Sleep apnea? denies   Any hygiene concerns?  denies   Independent of bathing and dressing?   Endorsed  Does the patient need help with medications?  Son is in charge because he was not taking medications correctly  Who is in charge of the finances?   Patient is in charge but sons are involved after some confusion with the bills    Any changes in appetite? It is ok  Eats in a restaurant several times a week . Seldom he cooks a breakfast  Patient have trouble swallowing?  denies   Does the patient cook?seldom  Any kitchen accidents such as leaving the stove on? Patient denies   Any headaches?  denies   Chronic back pain?  denies   Ambulates with difficulty? 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  Endorsed, last February he fell while gardening, mechanical, with some residual right-sided low back pain, no loss of consciousness, no head injury. Vision changes? Denies, had corneal implants. Unilateral weakness, numbness or tingling?  denies   Any tremors?  denies   Any anosmia?  denies   Any incontinence of urine? For the last 5 months he has more urgency.  Any bowel dysfunction?    denies      Patient lives with his son   History of heavy alcohol  intake? denies   History of heavy tobacco use? denies   Family history of dementia?  No Does patient drive?  Short  distances to [place], his friend home, grocery shop, but may have gotten lost going to the dentist            12/16/2023   11:00 AM  MMSE - Mini Mental State Exam  Orientation to time 3  Orientation to Place 3  Registration 3  Attention/ Calculation 4  Recall 0  Language- name 2 objects 2  Language- repeat 1  Language- follow 3 step command 3  Language- read & follow direction 1  Write a sentence 1  Copy design 0  Total score 21      08/31/2022   10:00 AM  Montreal Cognitive Assessment   Visuospatial/ Executive (0/5) 3  Naming (0/3) 3  Attention: Read list of digits (0/2) 1  Attention: Read list of letters (0/1) 1  Attention: Serial 7 subtraction starting at 100 (0/3) 1   Language: Repeat phrase (0/2) 0  Language : Fluency (0/1) 0  Abstraction (0/2) 2  Delayed Recall (0/5) 0  Orientation (0/6) 5  Total 16  Adjusted Score (based on education) 16      Objective:    Neurological Exam:    VITALS:   Vitals:   06/14/24 0900  BP: (!) 145/80  Pulse: 70  Resp: 20  SpO2: 100%  Weight: 127 lb (57.6 kg)  Height: 5' 4 (1.626 m)    GEN:  The patient appears stated age and is in NAD. HEENT:  Normocephalic, atraumatic.   Neurological examination:  General: NAD but uncomfortable due to back pain, well-groomed, appears stated age. Orientation: The patient is alert. Oriented to person, not to place and date Cranial nerves: There is good facial symmetry.The speech is fluent and clear. No aphasia or dysarthria. Fund of knowledge is appropriate. Recent and remote memory are impaired. Attention and concentration are reduced. Able to name objects and repeat phrases.  Hearing is intact to conversational tone.   Sensation: Sensation is intact to light touch throughout Motor: Strength is at least antigravity x4. DTR's 2/4 in UE/LE     Movement examination:  Tone: There is normal tone in the UE/LE Abnormal movements:  no tremor.  No myoclonus.  No asterixis.   Coordination:  There is no decremation with RAM's. Normal finger to nose  Gait and Station: The patient has some difficulty arising out of a deep-seated chair without the use of the hands. The patient's stride length is shorter than prior.  Gait is cautious and narrow.    Thank you for allowing us  the opportunity to participate in the care of this nice patient. Please do not hesitate to contact us  for any questions or concerns.   Total time spent on today's visit was 30 minutes dedicated to this patient today, preparing to see patient, examining the patient, ordering tests and/or medications and counseling the patient, documenting clinical information in the EHR or other health record, independently  interpreting results and communicating results to the patient/family, discussing treatment and goals, answering patient's questions and coordinating care.  Cc:  Antonio Cyndee Jamee JONELLE ROSALEA [facility] 06/15/2024 6:43 AM      "

## 2024-06-13 ENCOUNTER — Ambulatory Visit
Admission: RE | Admit: 2024-06-13 | Discharge: 2024-06-13 | Disposition: A | Discharge status: Home / Self Care | Source: Ambulatory Visit | Attending: Family Medicine | Admitting: Family Medicine

## 2024-06-13 DIAGNOSIS — S0990XA Unspecified injury of head, initial encounter: Secondary | ICD-10-CM

## 2024-06-13 LAB — LIPID PANEL
Cholesterol: 94 mg/dL (ref 28–200)
HDL: 24.5 mg/dL — ABNORMAL LOW
LDL Cholesterol: 34 mg/dL (ref 10–99)
NonHDL: 69.75
Total CHOL/HDL Ratio: 4
Triglycerides: 180 mg/dL — ABNORMAL HIGH (ref 10.0–149.0)
VLDL: 36 mg/dL (ref 0.0–40.0)

## 2024-06-13 LAB — CBC WITH DIFFERENTIAL/PLATELET
Basophils Absolute: 0.1 K/uL (ref 0.0–0.1)
Basophils Relative: 1.1 % (ref 0.0–3.0)
Eosinophils Absolute: 0.1 K/uL (ref 0.0–0.7)
Eosinophils Relative: 1 % (ref 0.0–5.0)
HCT: 41.7 % (ref 39.0–52.0)
Hemoglobin: 13.7 g/dL (ref 13.0–17.0)
Lymphocytes Relative: 15.9 % (ref 12.0–46.0)
Lymphs Abs: 2.1 K/uL (ref 0.7–4.0)
MCHC: 32.8 g/dL (ref 30.0–36.0)
MCV: 89.9 fl (ref 78.0–100.0)
Monocytes Absolute: 1 K/uL (ref 0.1–1.0)
Monocytes Relative: 7.9 % (ref 3.0–12.0)
Neutro Abs: 9.6 K/uL — ABNORMAL HIGH (ref 1.4–7.7)
Neutrophils Relative %: 74.1 % (ref 43.0–77.0)
Platelets: 224 K/uL (ref 150.0–400.0)
RBC: 4.64 Mil/uL (ref 4.22–5.81)
RDW: 15.2 % (ref 11.5–15.5)
WBC: 13 K/uL — ABNORMAL HIGH (ref 4.0–10.5)

## 2024-06-13 LAB — COMPREHENSIVE METABOLIC PANEL WITH GFR
ALT: 17 U/L (ref 3–53)
AST: 19 U/L (ref 5–37)
Albumin: 3.6 g/dL (ref 3.5–5.2)
Alkaline Phosphatase: 123 U/L — ABNORMAL HIGH (ref 39–117)
BUN: 36 mg/dL — ABNORMAL HIGH (ref 6–23)
CO2: 30 meq/L (ref 19–32)
Calcium: 8.5 mg/dL (ref 8.4–10.5)
Chloride: 99 meq/L (ref 96–112)
Creatinine, Ser: 1.65 mg/dL — ABNORMAL HIGH (ref 0.40–1.50)
GFR: 36.37 mL/min — ABNORMAL LOW
Glucose, Bld: 93 mg/dL (ref 70–99)
Potassium: 3.4 meq/L — ABNORMAL LOW (ref 3.5–5.1)
Sodium: 138 meq/L (ref 135–145)
Total Bilirubin: 0.6 mg/dL (ref 0.2–1.2)
Total Protein: 6.4 g/dL (ref 6.0–8.3)

## 2024-06-13 LAB — TSH: TSH: 5.69 u[IU]/mL — ABNORMAL HIGH (ref 0.35–5.50)

## 2024-06-14 ENCOUNTER — Encounter: Payer: Self-pay | Admitting: Physician Assistant

## 2024-06-14 ENCOUNTER — Ambulatory Visit: Admitting: Physician Assistant

## 2024-06-14 VITALS — BP 145/80 | HR 70 | Resp 20 | Ht 64.0 in | Wt 127.0 lb

## 2024-06-14 DIAGNOSIS — G309 Alzheimer's disease, unspecified: Secondary | ICD-10-CM | POA: Diagnosis not present

## 2024-06-14 DIAGNOSIS — F0280 Dementia in other diseases classified elsewhere without behavioral disturbance: Secondary | ICD-10-CM

## 2024-06-14 NOTE — Patient Instructions (Addendum)
 It was a pleasure to see you today at our office.   Recommendations:  Follow up in 2 months Continue Memantine  to 5 mg in the morning and 10 mg at night    Whom to call:  Memory  decline, memory medications: Call our office [phone]   For psychiatric meds, mood meds: Please have your primary care physician manage these medications.   Counseling regarding caregiver distress, including caregiver depression, anxiety and issues regarding community resources, adult day care programs, adult living facilities, or memory care questions:   Feel free to contact Misty Waddell Simmer, Social Worker at [phone]   For assessment of decision of mental capacity and competency:  Call Dr. Rosaline Nine, geriatric psychiatrist at [phone]  For guidance in geriatric dementia issues please call [facility] [phone]  For guidance regarding [facility] and if placement were needed at the facility, contact Nat Hock, Social Worker tel: [phone]  If you have any severe symptoms of a stroke, or other severe issues such as confusion,severe chills or fever, etc call 911 or go to the ER as you may need to be evaluated further       RECOMMENDATIONS FOR ALL PATIENTS WITH MEMORY PROBLEMS: 1. Continue to exercise (Recommend 30 minutes of walking everyday, or 3 hours every week) 2. Increase social interactions - continue going to [place] and enjoy social gatherings with friends and family 3. Eat healthy, avoid fried foods and eat more fruits and vegetables 4. Maintain adequate blood pressure, blood sugar, and blood cholesterol level. Reducing the risk of stroke and cardiovascular disease also helps promoting better memory. 5. Avoid stressful situations. Live a simple life and avoid aggravations. Organize your time and prepare for the next day in anticipation. 6. Sleep well, avoid any interruptions of sleep and avoid any distractions in the bedroom that may  interfere with adequate sleep quality 7. Avoid sugar, avoid sweets as there is a strong link between excessive sugar intake, diabetes, and cognitive impairment We discussed the Mediterranean diet, which has been shown to help patients reduce the risk of progressive memory disorders and reduces cardiovascular risk. This includes eating fish, eat fruits and green leafy vegetables, nuts like almonds and hazelnuts, walnuts, and also use olive oil. Avoid fast foods and fried foods as much as possible. Avoid sweets and sugar as sugar use has been linked to worsening of memory function.  There is always a concern of gradual progression of memory problems. If this is the case, then we may need to adjust level of care according to patient needs. Support, both to the patient and caregiver, should then be put into place.    FALL PRECAUTIONS: Be cautious when walking. Scan the area for obstacles that may increase the risk of trips and falls. When getting up in the mornings, sit up at the edge of the bed for a few minutes before getting out of bed. Consider elevating the bed at the head end to avoid drop of blood pressure when getting up. Walk always in a well-lit room (use night lights in the walls). Avoid area rugs or power cords from appliances in the middle of the walkways. Use a walker or a cane if necessary and consider physical therapy for balance exercise. Get your eyesight checked regularly.  FINANCIAL OVERSIGHT: Supervision, especially oversight when making financial decisions or transactions is also recommended.  HOME SAFETY: Consider the safety of the kitchen when operating appliances like stoves, microwave oven, and blender. Consider having supervision and  share cooking responsibilities until no longer able to participate in those. Accidents with firearms and other hazards in the house should be identified and addressed as well.   ABILITY TO BE LEFT ALONE: If patient is unable to contact 911 operator,  consider using [employer], or when the need is there, arrange for someone to stay with patients. Smoking is a fire hazard, consider supervision or cessation. Risk of wandering should be assessed by caregiver and if detected at any point, supervision and safe proof recommendations should be instituted.  MEDICATION SUPERVISION: Inability to self-administer medication needs to be constantly addressed. Implement a mechanism to ensure safe administration of the medications.   DRIVING: Regarding driving, in patients with progressive memory problems, driving will be impaired. We advise to have someone else do the driving if trouble finding directions or if minor accidents are reported. Independent driving assessment is available to determine safety of driving.   If you are interested in the driving assessment, you can contact the following:  [employer] in [place] [phone]  [facility] [phone]  [facility] [phone]  [facility] [phone] or [phone]

## 2024-06-17 ENCOUNTER — Emergency Department (HOSPITAL_BASED_OUTPATIENT_CLINIC_OR_DEPARTMENT_OTHER): Admitting: Radiology

## 2024-06-17 ENCOUNTER — Emergency Department (HOSPITAL_BASED_OUTPATIENT_CLINIC_OR_DEPARTMENT_OTHER)
Admission: EM | Admit: 2024-06-17 | Discharge: 2024-06-17 | Disposition: A | Discharge status: Home / Self Care | Attending: Emergency Medicine | Admitting: Emergency Medicine

## 2024-06-17 ENCOUNTER — Emergency Department (HOSPITAL_BASED_OUTPATIENT_CLINIC_OR_DEPARTMENT_OTHER)

## 2024-06-17 DIAGNOSIS — Z794 Long term (current) use of insulin: Secondary | ICD-10-CM | POA: Insufficient documentation

## 2024-06-17 DIAGNOSIS — N189 Chronic kidney disease, unspecified: Secondary | ICD-10-CM | POA: Insufficient documentation

## 2024-06-17 DIAGNOSIS — Z7984 Long term (current) use of oral hypoglycemic drugs: Secondary | ICD-10-CM | POA: Diagnosis not present

## 2024-06-17 DIAGNOSIS — Z7982 Long term (current) use of aspirin: Secondary | ICD-10-CM | POA: Diagnosis not present

## 2024-06-17 DIAGNOSIS — F0280 Dementia in other diseases classified elsewhere without behavioral disturbance: Secondary | ICD-10-CM | POA: Insufficient documentation

## 2024-06-17 DIAGNOSIS — I2510 Atherosclerotic heart disease of native coronary artery without angina pectoris: Secondary | ICD-10-CM | POA: Insufficient documentation

## 2024-06-17 DIAGNOSIS — S51812A Laceration without foreign body of left forearm, initial encounter: Secondary | ICD-10-CM | POA: Diagnosis not present

## 2024-06-17 DIAGNOSIS — Y92003 Bedroom of unspecified non-institutional (private) residence as the place of occurrence of the external cause: Secondary | ICD-10-CM | POA: Insufficient documentation

## 2024-06-17 DIAGNOSIS — Z79899 Other long term (current) drug therapy: Secondary | ICD-10-CM | POA: Diagnosis not present

## 2024-06-17 DIAGNOSIS — Y9301 Activity, walking, marching and hiking: Secondary | ICD-10-CM | POA: Insufficient documentation

## 2024-06-17 DIAGNOSIS — W1830XA Fall on same level, unspecified, initial encounter: Secondary | ICD-10-CM | POA: Diagnosis not present

## 2024-06-17 DIAGNOSIS — S2243XA Multiple fractures of ribs, bilateral, initial encounter for closed fracture: Secondary | ICD-10-CM | POA: Diagnosis not present

## 2024-06-17 DIAGNOSIS — R296 Repeated falls: Secondary | ICD-10-CM

## 2024-06-17 DIAGNOSIS — E1122 Type 2 diabetes mellitus with diabetic chronic kidney disease: Secondary | ICD-10-CM | POA: Insufficient documentation

## 2024-06-17 DIAGNOSIS — G309 Alzheimer's disease, unspecified: Secondary | ICD-10-CM | POA: Insufficient documentation

## 2024-06-17 DIAGNOSIS — R0781 Pleurodynia: Secondary | ICD-10-CM | POA: Diagnosis present

## 2024-06-17 MED ORDER — ACETAMINOPHEN 500 MG PO TABS
1000.0000 mg | ORAL_TABLET | Freq: Once | ORAL | Status: AC
  Administered 2024-06-17: 1000 mg via ORAL
  Filled 2024-06-17: qty 2

## 2024-06-17 NOTE — ED Notes (Signed)
 DC paperwork given and verbally understood.

## 2024-06-17 NOTE — ED Provider Notes (Signed)
 " [place] EMERGENCY DEPARTMENT AT [facility] Provider Note   CSN: [id number] Arrival date & time: 06/17/24  9056     Patient presents with: [name]   [name] is a 89 y.o. male.  With a history of CAD, type 2 diabetes, CKD and Alzheimer's who presents to the ED after fall.  Patient suffered a mechanical fall while walking in his bedroom earlier this morning.  Landed on his left side.  Does not recall if he suffered head trauma.  Denies loss of consciousness.  Now with a skin tear over the left forearm and pain bruising over the left ribs.  Pain over the left ribs and upper back.  No shortness of breath abdominal pain pain in right upper extremity or lower extremities.  No anticoagulation.     Fall       Prior to Admission medications  Medication Sig Start Date End Date Taking? Authorizing Provider  acetaminophen  (TYLENOL ) 500 MG tablet Take 2 tablets (1,000 mg total) by mouth every 6 (six) hours as needed. 06/26/22   [name], MD  Alcohol  Swabs  PADS Use as directed once a day 11/10/20   [name], Jamee SAUNDERS, DO  alendronate  (FOSAMAX ) 70 MG tablet TAKE 1 TABLET ONCE WEEKLY *TAKE WITH A FULL GLASS OF WATER ON AN EMPTY STOMACH* 05/16/22   Rice, [name] ORN, MD  amLODipine  (NORVASC ) 5 MG tablet Take by mouth. 05/24/24   [provider]  aspirin 81 MG tablet Take 81 mg by mouth daily.    [provider]  atorvastatin  (LIPITOR) 20 MG tablet TAKE 1 TABLET EVERY DAY 06/06/24   [name], [name], DO  Blood Glucose Calibration (TRUE METRIX LEVEL 1) Low SOLN USE AS DIRECTED 03/19/20   [name], [name] [name], MD  Blood Glucose Calibration (TRUE METRIX LEVEL 3) High SOLN Use to check controls on glucometer strips every 30 days or with each new bottle of strips (whichever comes first). 01/24/20   [name] Jamee R, DO  Blood Glucose Monitoring Suppl (TRUE METRIX METER) w/Device KIT USE AS DIRECTED ONCE A DAY 08/14/20   [name], [name],  MD  brimonidine (ALPHAGAN) 0.2 % ophthalmic solution SMARTSIG:In Eye(s)    [provider]  brimonidine-timolol (COMBIGAN) 0.2-0.5 % ophthalmic solution Place 1 drop into both eyes 2 (two) times daily.    [provider]  COLLAGEN PO Take by mouth. 1 scoop daily    [provider]  cyclobenzaprine  (FLEXERIL ) 5 MG tablet Take 1 tablet (5 mg total) by mouth 2 (two) times daily as needed for muscle spasms. 06/12/24   [name], [name], DO  diclofenac  Sodium (VOLTAREN ) 1 % GEL Apply 4 g topically 4 (four) times daily. 06/05/24   Emil Share, DO  dorzolamide-timolol (COSOPT) 2-0.5 % ophthalmic solution SMARTSIG:In Eye(s)    [provider]  empagliflozin  (JARDIANCE ) 25 MG TABS tablet TAKE 1 TABLET EVERY DAY BEFORE BREAKFAST 03/07/24   [name], [name], MD  fish oil-omega-3 fatty acids 1000 MG capsule Take 2 g by mouth daily.    [provider]  glucose blood (TRUE METRIX BLOOD GLUCOSE TEST) test strip TEST BLOOD SUGAR ONE TIME DAILY 08/17/23   [name], [name], DO  insulin  glargine (LANTUS  SOLOSTAR) 100 UNIT/ML Solostar Pen Inject 30 Units into the skin daily. 04/13/24   [name], [name], MD  Insulin  Pen Needle 32G X 4 MM MISC 1 Device by Does not apply route daily in the afternoon. 08/12/23   [name],  [name], MD  latanoprost (XALATAN) 0.005 % ophthalmic solution SMARTSIG:In Eye(s) 03/17/23   [provider]  lidocaine  (LIDODERM ) 5 % Place 1 patch onto the skin daily. Remove & Discard patch within 12 hours or as directed by MD 06/06/24   Odell Balls, PA-C  lidocaine  (LIDODERM ) 5 % Place 1 patch onto the skin daily. Remove & Discard patch within 12 hours or as directed by MD    [provider]  linagliptin  (TRADJENTA ) 5 MG TABS tablet Take 1 tablet (5 mg total) by mouth daily. 12/12/23   [name], [name], MD  losartan -hydrochlorothiazide (HYZAAR) 100-25 MG tablet TAKE 1 TABLET EVERY DAY 06/06/24    [name], [name], DO  memantine  (NAMENDA ) 5 MG tablet Take 2 tabs at night and 1 tab in the morning 12/16/23   [name], [name], PA-C  metoprolol  succinate (TOPROL -XL) 50 MG 24 hr tablet TAKE 1 TABLET EVERY DAY WITH OR IMMEDIATELY FOLLOWING A MEAL 05/14/24   [name], [name], DO  Multiple Vitamin (MULTIVITAMIN) tablet Take 1 tablet by mouth daily.    [provider]  NONFORMULARY OR COMPOUNDED ITEM Walker  #1  sig as directed due to frequent falls and balance issues 06/12/24   [name], Jamee R, DO  tamsulosin  (FLOMAX ) 0.4 MG CAPS capsule TAKE 1 CAPSULE EVERY DAY AFTER SUPPER 05/14/24   [name], [name], DO  TRUEplus Lancets 33G MISC Use to check blood sugar once a day.  DX  E11.9 11/10/20   [name], [name], DO  vitamin B-12 (CYANOCOBALAMIN ) 100 MCG tablet Take 100 mcg by mouth daily.    [provider]    Allergies: Patient has no known allergies.    Review of Systems  Updated Vital Signs BP (!) 171/79   Pulse 82   Temp (!) 97.5 F (36.4 C) (Oral)   Resp 16   Ht 5' 4 (1.626 m)   Wt 61.2 kg   SpO2 100%   BMI 23.17 kg/m   Physical Exam Vitals and nursing note reviewed.  HENT:     Head: Normocephalic and atraumatic.  Eyes:     Pupils: Pupils are equal, round, and reactive to light.  Cardiovascular:     Rate and Rhythm: Normal rate and regular rhythm.  Pulmonary:     Effort: Pulmonary effort is normal.     Breath sounds: Normal breath sounds.  Abdominal:     Palpations: Abdomen is soft.     Tenderness: There is no abdominal tenderness.  Musculoskeletal:     Cervical back: Neck supple. No tenderness.     Comments: Thoracic midline and paraspinal tenderness No midline tenderness step-off deformity of the lumbar spine 5 out of 5 motor strength full active range of motion bilateral lower extremities bilateral upper extremities Some bony tenderness over the left elbow with a skin tear over the left forearm measuring approximately 4 cm semilunar  in shape Bruising tenderness over left lateral ribs  Skin:    General: Skin is warm and dry.  Neurological:     Mental Status: He is alert.  Psychiatric:        Mood and Affect: Mood normal.     (all labs ordered are listed, but only abnormal results are displayed) Labs Reviewed - No data to display  EKG: None  Radiology: DG Elbow Complete Left Result Date: 06/17/2024 EXAM: 4 VIEW(S) XRAY OF THE LEFT ELBOW COMPARISON: None available. CLINICAL HISTORY: 89 year old male with multiple recent falls. FINDINGS: BONES AND  JOINTS: No acute fracture. Normal joint space is appropriate for age.  No malalignment. SOFT TISSUES: No evidence of acute effusion. No discrete soft tissue injury. IMPRESSION: 1. No acute fracture or dislocation identified about the left elbow. Electronically signed by: Helayne Hurst MD MD 06/17/2024 11:35 AM EST RP Workstation: [id number]   CT T-SPINE NO CHARGE Result Date: 06/17/2024 EXAM: CT THORACIC SPINE WITHOUT CONTRAST 06/17/2024 10:54:07 AM TECHNIQUE: CT of the thoracic spine was performed without the administration of intravenous contrast. Multiplanar reformatted images are provided for review. Automated exposure control, iterative reconstruction, and/or weight based adjustment of the mA/kV was utilized to reduce the radiation dose to as low as reasonably achievable. COMPARISON: CT chest reported separately today. CTA chest 04/05/2024. CT cervical spine reported separately today. CLINICAL HISTORY: 89 year old male with multiple recent falls. FINDINGS: Normal thoracic segmentation . BONES AND ALIGNMENT: Normal vertebral body heights except for T3 and T5. Generalized osteopenia. 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 Previous T5 compression fracture with interval 14% loss of vertebral body height. Mild retropulsion of the T5 posterior inferior endplate has not changed. Mild T3 superior endplate compression is stable. No thoracic vertebral acute  loss of height. 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 Stable vacuum disc at T7-T8 and T8-T9. Posterior elements appear intact and aligned. Bilateral rib fractures are detailed separately today.The visible upper lumbar vertebrae appear grossly intact. DEGENERATIVE CHANGES: CT evidence of mild thoracic spinal stenosis at T5-T6 is stable from last month. Stable vacuum disc at T7-T8 and T8-T9. SOFT TISSUES: Thoracic paraspinal soft tissues are within normal limits. Chest and abdominal viscera are detailed separately today. IMPRESSION: 1. Interval loss of height of nonacute T5 compression fracture (14% since December), with stable mild retropulsion and probable mild subsequent spinal stenosis at T5-T6. 2. No acute traumatic injury identified in the thoracic spine. 3. Rib fractures on CT chest reported separately today. Electronically signed by: Helayne Hurst MD MD 06/17/2024 11:34 AM EST RP Workstation: [id number]   CT Chest Wo Contrast Result Date: 06/17/2024 EXAM: CT CHEST WITHOUT CONTRAST 06/17/2024 10:54:07 AM TECHNIQUE: CT of the chest was performed without the administration of intravenous contrast. Multiplanar reformatted images are provided for review. Automated exposure control, iterative reconstruction, and/or weight based adjustment of the mA/kV was utilized to reduce the radiation dose to as low as reasonably achievable. COMPARISON: CT chest, abdomen, and pelvis 06/05/2024. Thoracic spine CT 06/17/2024 reported separately. CLINICAL HISTORY: 89 year old male. Chest trauma, blunt; L rib fx? Multiple recent falls. FINDINGS: MEDIASTINUM: Extensive calcified coronary artery and aortic atherosclerosis. Normal heart size. No pericardial effusion. Stable major airway patency. LYMPH NODES: No mediastinal, hilar or axillary lymphadenopathy. LUNGS AND PLEURA: Partially layering, partially loculated small volume left pleural effusion with simple fluid density is associated with unchanged  left lung base pleural parenchymal thickening and atelectasis. Favor chronic fibrothorax. Contralateral right lung base mild atelectasis is stable. No acute pulmonary opacity. No pneumothorax or pulmonary contusion. SOFT TISSUES/BONES: Generalized osteopenia. Acute left lateral rib fractures ribs 4 (series 4 image 55) and 9 (series 4 image 116). Left lateral 6th and 7th rib fractures are significantly comminuted and mildly displaced (series 4 image 90). Most of the other fractures are minimally to nondisplaced. Superimposed healing left anterior third rib fracture was present last month and appears unchanged. Superimposed acute to subacute nondisplaced fractures of the posterior right T10 through T12 ribs, and mildly displaced acute right posterior ninth rib fracture (series 4 image 104). Probable nondisplaced right posterior eighth  rib fracture. Intact sternum. Left lateral chest wall mild associated intercostal and deep oblique muscle soft tissue swelling. No measurable chest wall hematoma. No chest wall gas. UPPER ABDOMEN: Stable elevation of the left hemidiaphragm. Stable visible non-contrast upper abdominal viscera. IMPRESSION: 1. Acute bilateral rib fractures ( left 4 to 9, 6-7 comminuted mildly displaced and right 8-12, 9 mildly displaced). No pneumothorax or pulmonary contusion. 2. Chronic left lung base fibrothorax, mild right lung base atelectasis, not significantly changed from last month. 3. Severe calcified atherosclerosis. 4. Thoracic spine CT reported separately. Electronically signed by: Helayne Hurst MD MD 06/17/2024 11:28 AM EST RP Workstation: [id number]   CT Cervical Spine Wo Contrast Result Date: 06/17/2024 EXAM: CT CERVICAL SPINE WITHOUT CONTRAST 06/17/2024 10:54:07 AM TECHNIQUE: CT of the cervical spine was performed without the administration of intravenous contrast. Multiplanar reformatted images are provided for review. Automated exposure control, iterative reconstruction, and/or weight  based adjustment of the mA/kV was utilized to reduce the radiation dose to as low as reasonably achievable. COMPARISON: CT head and chest today reported separately. CLINICAL HISTORY: 89 year old male with neck trauma and multiple recent falls. FINDINGS: BONES AND ALIGNMENT: Normal lordosis. Heterogeneous bone mineralization appears to be a combination of osteopenia and degenerative sclerosis. No acute fracture or traumatic malalignment. DEGENERATIVE CHANGES: Multilevel advanced chronic facet arthropathy in the cervical spine greater on the right. No age advanced cervical disc degeneration. No CT evidence of cervical spinal stenosis. SOFT TISSUES: No prevertebral soft tissue swelling. Bulky but benign postinflammatory calcifications of the bilateral tonsillar pillars. Mild to moderate calcified atherosclerosis at the carotid bifurcations. Otherwise negative visible non-contrast neck soft tissues. IMPRESSION: 1. No acute traumatic injury identified in the cervical spine. Electronically signed by: Helayne Hurst MD MD 06/17/2024 11:19 AM EST RP Workstation: [id number]   CT Head Wo Contrast Result Date: 06/17/2024 EXAM: CT HEAD WITHOUT CONTRAST 06/17/2024 10:54:07 AM TECHNIQUE: CT of the head was performed without the administration of intravenous contrast. Automated exposure control, iterative reconstruction, and/or weight based adjustment of the mA/kV was utilized to reduce the radiation dose to as low as reasonably achievable. COMPARISON: Brain MRI 06/13/2024 and earlier studies. CLINICAL HISTORY: 89 year old male with minor head trauma and multiple recent falls. FINDINGS: BRAIN AND VENTRICLES: Some generalized decrease in brain volume since 2023, stable from recent MRI. No acute hemorrhage. No evidence of acute infarct. No hydrocephalus. No extra-axial collection. No mass effect or midline shift. Normal for age gray white differentiation. No suspicious intracranial vascular hyperdensity. Advanced calcified  atherosclerosis at the skull base. ORBITS: No acute abnormality. SINUSES: Paranasal sinuses, tympanic cavities, and mastoids are well aerated. Advanced calcification is present within these structures. SOFT TISSUES AND SKULL: No acute soft tissue abnormality. No skull fracture. IMPRESSION: 1. No acute traumatic injury or acute intracranial abnormality identified. Electronically signed by: Helayne Hurst MD MD 06/17/2024 11:16 AM EST RP Workstation: [id number]     .Laceration Repair  Date/Time: 06/17/2024 12:19 PM  Performed by: Pamella Ozell LABOR, DO Authorized by: Pamella Ozell LABOR, DO   Consent:    Consent obtained:  Verbal   Consent given by:  Patient Anesthesia:    Anesthesia method:  None Laceration details:    Length (cm):  4   Depth (mm):  0.3 Treatment:    Area cleansed with:  Saline   Amount of cleaning:  Standard   Irrigation solution:  Sterile saline   Irrigation volume:  10   Debridement:  None Skin repair:    Repair method:  Steri-Strips   Number  of Steri-Strips:  6 Approximation:    Approximation:  Close Repair type:    Repair type:  Simple Post-procedure details:    Dressing:  Non-adherent dressing and bulky dressing   Procedure completion:  Tolerated    Medications Ordered in the ED  acetaminophen  (TYLENOL ) tablet 1,000 mg (has no administration in time range)    Clinical Course as of 06/17/24 1220  Sun Jun 17, 2024  1217 CT head C-spine thoracic spine showed no acute traumatic findings.  Elbow looks okay.  Multiple rib fractures on the left and right seen on CT chest.  Patient has remained stable on room air without evidence of contusion hemothorax pneumothorax on CT.  Pulling about 1250 on incentive spirometer.  Surprisingly very comfortable appearing.  Feel as though risk of opioid analgesic would outweigh the benefits in this patient with recent repeated falls.  Discussed the findings with him and his son at bedside who he lives with.  He wants to go home with  plan for pain management with Tylenol  Motrin and continued incentive spirometer.  He will follow-up with PCP [MP]    Clinical Course User Index [MP] Pamella Ozell LABOR, DO                                 Medical Decision Making 89 year old male with history as above presented to the ED after mechanical fall at home.  No anticoagulation.  Hemodynamically stable.  Skin tear over left forearm and bruising over left ribs.  Will obtain CT head and C-spine chest thoracic spine to evaluate for traumatic injury.  Will repair skin tear with Steri-Strips  Amount and/or Complexity of Data Reviewed Radiology: ordered.  Risk OTC drugs.        Final diagnoses:  Closed fracture of multiple ribs of both sides, initial encounter  Fall in elderly patient    ED Discharge Orders     None          Pamella Ozell LABOR, DO 06/17/24 1220  "

## 2024-06-17 NOTE — Discharge Instructions (Signed)
 You were seen in the emerged ferment for injuries after fall You have broken ribs on the left and right side These do not require surgery but is important that you manage her pain at home with Tylenol  Motrin and using incentive spirometer as discussed Follow-up with your primary doctor within 1 week for reevaluation Return to the Emergency Department for severe pain trouble breathing or other concerns

## 2024-06-17 NOTE — ED Triage Notes (Signed)
 Pt caox4 ambulatory with walker reporting multiple mechanical falls over the past week with bruising to L flank and skin tears on L forearm. Denies SOB. Denies hitting head. Does not take blood thinner.

## 2024-06-18 NOTE — Assessment & Plan Note (Signed)
 Encourage heart healthy diet such as MIND or DASH diet, increase exercise, avoid trans fats, simple carbohydrates and processed foods, consider a krill or fish or flaxseed oil cap daily.

## 2024-06-18 NOTE — Assessment & Plan Note (Signed)
 Per endo

## 2024-06-18 NOTE — Assessment & Plan Note (Signed)
 Per neuro

## 2024-06-19 ENCOUNTER — Other Ambulatory Visit: Payer: Self-pay | Admitting: Family Medicine

## 2024-06-19 DIAGNOSIS — K551 Chronic vascular disorders of intestine: Secondary | ICD-10-CM

## 2024-06-21 ENCOUNTER — Ambulatory Visit: Attending: Family Medicine

## 2024-06-21 ENCOUNTER — Ambulatory Visit (HOSPITAL_BASED_OUTPATIENT_CLINIC_OR_DEPARTMENT_OTHER)
Admission: RE | Admit: 2024-06-21 | Discharge: 2024-06-21 | Disposition: A | Discharge status: Home / Self Care | Source: Ambulatory Visit | Attending: Family Medicine | Admitting: Family Medicine

## 2024-06-21 ENCOUNTER — Other Ambulatory Visit: Payer: Self-pay

## 2024-06-21 ENCOUNTER — Encounter (HOSPITAL_BASED_OUTPATIENT_CLINIC_OR_DEPARTMENT_OTHER): Payer: Self-pay

## 2024-06-21 DIAGNOSIS — R262 Difficulty in walking, not elsewhere classified: Secondary | ICD-10-CM | POA: Insufficient documentation

## 2024-06-21 DIAGNOSIS — K551 Chronic vascular disorders of intestine: Secondary | ICD-10-CM | POA: Insufficient documentation

## 2024-06-21 DIAGNOSIS — R2689 Other abnormalities of gait and mobility: Secondary | ICD-10-CM | POA: Insufficient documentation

## 2024-06-21 DIAGNOSIS — R2681 Unsteadiness on feet: Secondary | ICD-10-CM | POA: Diagnosis not present

## 2024-06-21 DIAGNOSIS — R296 Repeated falls: Secondary | ICD-10-CM | POA: Insufficient documentation

## 2024-06-21 MED ORDER — IOHEXOL 350 MG/ML SOLN
100.0000 mL | Freq: Once | INTRAVENOUS | Status: AC | PRN
  Administered 2024-06-21: 80 mL via INTRAVENOUS

## 2024-06-21 NOTE — Therapy (Signed)
 " OUTPATIENT PHYSICAL THERAPY LOWER EXTREMITY EVALUATION   Patient Name: [name] MRN: [id number] DOB:1932/01/20, 89 y.o., male Today's Date: 06/21/2024  END OF SESSION:  PT End of Session - 06/21/24 1337     Visit Number 1    Date for Recertification  09/13/24    Progress Note Due on Visit 10    PT Start Time 0852    PT Stop Time 0934    PT Time Calculation (min) 42 min    Activity Tolerance Patient tolerated treatment well    Behavior During Therapy [facility] for tasks assessed/performed          Past Medical History:  Diagnosis Date   Anemia, unspecified 10/27/2009   BPH with urinary obstruction 12/13/2012   CAD (coronary artery disease)    LHC 03/25/11 by Dr. Wonda:  pLAD 99%, oCFX 20-30%, pOM1 40%, dAVCFX 20-30%.  EF was normal on nuclear study.  He was treated with a Promus DES to his pLAD.    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/2023   Monoclonal gammopathy 04/06/2019   Multiple closed fractures of ribs of left side 03/21/2019   Multiple myeloma not having achieved remission 08/13/2022   Osteoporosis 03/21/2019   Peripheral vascular disorder due to diabetes mellitus 08/13/2022   Personal history of colonic polyps 1996   villous adenoma   Primary open angle glaucoma (POAG) of left eye, mild stage 02/04/2019   Primary open angle glaucoma (POAG) of right eye, moderate stage 02/04/2019   Prostate cancer 02/15/2017   Stage 3b chronic kidney disease 02/06/2020   Type II diabetes mellitus 07/04/2006   Vitamin D  deficiency 01/28/2021   Past Surgical History:  Procedure Laterality Date   CARDIAC CATHETERIZATION  03/25/2011   cataract Bilateral  2010   corenea implants  june and sept 2018   dr donnice [facility]   CORONARY STENT PLACEMENT  03/25/2011   RADIOACTIVE SEED IMPLANT N/A 05/06/2017   Procedure: RADIOACTIVE SEED IMPLANT/BRACHYTHERAPY IMPLANT;  Surgeon: Ottelin, Mark, MD;  Location: [facility];  Service: Urology;  Laterality: N/A;   SPACE OAR INSTILLATION N/A 05/06/2017   Procedure: SPACE OAR INSTILLATION;  Surgeon: Ottelin, Mark, MD;  Location: 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 02/22/2023   Macular degeneration    Multiple myeloma not having achieved remission 08/13/2022   Lambda light chain disease 08/13/2022   Peripheral vascular disorder due to diabetes mellitus 08/13/2022   Vitamin D  deficiency 01/28/2021   Stage 3b chronic kidney disease 02/06/2020   Diabetic polyneuropathy 02/06/2020   Hyperlipidemia associated with type 2 diabetes mellitus 04/06/2019   Monoclonal gammopathy 04/06/2019   Osteoporosis 03/21/2019   Primary open angle glaucoma (POAG) of left eye, mild stage 02/04/2019   Primary open angle glaucoma (POAG) of right eye, moderate stage 02/04/2019   Prostate cancer 02/15/2017   Fuchs' corneal dystrophy 08/18/2016   BPH with urinary obstruction 12/13/2012   CAD (coronary artery disease) 04/09/2011   Gastritis and gastroduodenitis 05/25/2010   Anemia, unspecified 10/27/2009   Type II diabetes mellitus with  stage 3 chronic kidney disease (HCC) 07/04/2006   Essential hypertension 07/04/2006   History of colonic polyps 1996    PCP: Antonio Cyndee Rockers DO  REFERRING PROVIDER: same  REFERRING DIAG: unsteady gait, balance disorder  THERAPY DIAG:  Difficulty in walking, not elsewhere classified - Plan: PT plan of care cert/re-cert  Repeated falls - Plan: PT plan of care cert/re-cert  Rationale for Evaluation and  Treatment: Rehabilitation  ONSET DATE: several months  SUBJECTIVE:   SUBJECTIVE STATEMENT: Patient very pleasant, reports midline lower back pain which is pretty constant, reports several times during today's session.   PERTINENT HISTORY: Son describes recent decline in last few weeks, months and several falls, had T spine compression fracture that was present for several months,  this weekend he fell again and fractured several ribs . Recent incidences of hallucinations and confusion.  Pt lives with one son, has 3 son and one daughter in law within the area who are rotating supervision for pt due to his recent decline in mental acuity and his falls. They have obtained a bed alarm, his bedroom and bathroom are upstairs.  Also he is recently using a rollator in and out of the house.  The pt has been very physically active, participated in the senior games in April and won the 100 and 200 meter dash.   His decline in physical stability and mental acuity have occurred rather recently and suddenly.  PAIN:  Are you having pain? Frequently c/o today of lower back pain across T 9 region  PRECAUTIONS: None  RED FLAGS: None   WEIGHT BEARING RESTRICTIONS: No  FALLS:  Has patient fallen in last 6 months? No  LIVING ENVIRONMENT: Lives with: lives with their family Lives in: House/apartment Stairs: Yes: Internal: flight steps; on right going up Has following equipment at home: Vannie - 4 wheeled  OCCUPATION: retired  PLOF: Independent  PATIENT GOALS: son's goal is to ensure pts safety, patient is concerned about his back pain  NEXT MD VISIT: 1 month  OBJECTIVE:  Note: Objective measures were completed at Evaluation unless otherwise noted.  DIAGNOSTIC FINDINGS: MRI brain negative T5 compression fx unknown origin and age 42 CT head C-spine thoracic spine showed no acute traumatic findings.  Elbow looks okay.  Multiple rib fractures on the left and right seen on CT chest.    PATIENT  SURVEYS:  TBD  COGNITION: Overall cognitive status: History of cognitive impairments - at baseline     SENSATION: WFL  EDEMA:  None noted  MUSCLE LENGTH:wfl POSTURE: lean healthy male, mild increased mid thoracic flexion, lumbar flexion in sitting and standing  PALPATION: C/o pain around T 8 level, across lower back  LOWER EXTREMITY ROM: wfl   LOWER EXTREMITY MMT:  MMT Right eval Left eval  Hip flexion    Hip extension    Hip abduction    Hip adduction    Hip internal rotation    Hip external rotation    Knee flexion    Knee extension    Ankle dorsiflexion    Ankle plantarflexion    Ankle inversion    Ankle eversion     (Blank rows = not tested)   FUNCTIONAL TESTS:  [place] Balance Scale:  Item Test date: 06/21/24 Date:  Date:   Sitting to standing 4. able to stand without using hands and stabilize independently Insert SmartPhrase OPRCBERGREEVAL Insert SmartPhrase OPRCBERGREEVAL  2. Standing unsupported 4. able to stand safely for 2 minutes    3. Sitting with  back unsupported, feet supported 4. able to sit safely and securely for 2 minutes    4. Standing to sitting 4. sits safely with minimal use of hands    5. Pivot transfer  4. able to transfer safely with minor use of hands    6. Standing unsupported with eyes closed 4. able to stand 10 seconds safely    7. Standing unsupported with feet together 4. able to place feet together independently and stand 1 minute safely    8. Reaching forward with outstretched arms while standing 3. can reach forward 12 cm (5 inches)    9. Pick up object from the floor from standing 3. able to pick up slipper but needs supervision    10. Turning to look behind over left and right shoulders while standing 4. looks behind from both sides and weight shifts well    11. Turn 360 degrees 4. able to turn 360 degrees safely in 4 seconds or less    12. Place alternate foot on step or stool while standing unsupported 1. able to complete > 2  steps needs minimal assist    13. Standing unsupported one foot in front 0. loses balance while stepping or standing    14. Standing on one leg 1. tries to lift leg unable to hold 3 seconds but remains standing independently.      Total Score 48/56 Total Score:    Total Score:     30 sec sit to stand:  17 GAIT: Distance walked: in clinic 57' Assistive device utilized: [employer] - 4 wheeled Level of assistance: SBA Comments: fast cadence                                                                                                                                 TREATMENT DATE: 06/21/24: Evaluation, education/ recommended to pt and son possibly adding bed rail to assist with further fall prevention when getting out of bed and add grab bars outside of shower.      PATIENT EDUCATION:  Education details: POC, goals Person educated: Patient Education method: Explanation, Demonstration, Tactile cues, and Verbal cues Education comprehension: verbalized understanding, returned demonstration, verbal cues required, tactile cues required, and needs further education  HOME EXERCISE PROGRAM: TBD  ASSESSMENT:  CLINICAL IMPRESSION: Patient is a 89 y.o. male who was seen today for physical therapy evaluation and treatment for recent decline in mental acuity and recent falls.  He fell this weekend and fractured several ribs.  He was attended today by his son who is his health care POA.  The pt has short term memory deficits which were evident today during the evaluation. Physically he did not have any marked weakness, or vestibular deficits. He did demonstrate some loss of righting reactions as well as loss of balance with narrowed stance activities.  Fortunately he is accepting and using his new rollator.  He should benefit from supervised exercise with combined cardiovascular training  and balance training with physical therapy to further assist him with fall prevention. They do have a TENS unit which he  they started utilizing at home. He may also benefit from postural/periscapular strengthening to reduce his back pain.  OBJECTIVE IMPAIRMENTS: decreased balance, decreased cognition, decreased knowledge of condition, decreased knowledge of use of DME, difficulty walking, decreased safety awareness, and pain.   ACTIVITY LIMITATIONS: carrying, transfers, and locomotion level  PARTICIPATION LIMITATIONS: community activity  PERSONAL FACTORS: Age, Time since onset of injury/illness/exacerbation, and 1-2 comorbidities: alzheimers are also affecting patient's functional outcome.   REHAB POTENTIAL: Good  CLINICAL DECISION MAKING: Evolving/moderate complexity  EVALUATION COMPLEXITY: Moderate   GOALS: Goals reviewed with patient? Yes  SHORT TERM GOALS: Target date:  2 weeks, 07/05/24 I HEP Baseline: Goal status: INITIAL   LONG TERM GOALS: Target date: 09/13/24, 12 weeks  [place] improve from 48/56 to 53/56 Baseline:  Goal status: INITIAL  2.  FGA 27/30 Baseline: TBD Goal status: INITIAL  3.  Improve righting reactions, able to heel rock without loss of balance Baseline:  Goal status: INITIAL   PLAN:  PT FREQUENCY: 2x/week  PT DURATION: 12 weeks  PLANNED INTERVENTIONS: 97110-Therapeutic exercises, 97530- Therapeutic activity, [id number]- Neuromuscular re-education, 97535- Self Care, [id number]- Manual therapy, and Patient/Family education  PLAN FOR NEXT SESSION: address cardivascular conditioning, balance training   Daisha Filosa L Nevah Dalal, PT, DPT, OCS 06/21/2024, 5:09 PM  "

## 2024-06-25 ENCOUNTER — Encounter: Payer: Self-pay | Admitting: Family Medicine

## 2024-06-25 ENCOUNTER — Ambulatory Visit: Admitting: Family Medicine

## 2024-06-25 VITALS — BP 100/70 | HR 87 | Temp 97.8°F | Resp 16 | Ht 64.0 in | Wt 132.2 lb

## 2024-06-25 DIAGNOSIS — G309 Alzheimer's disease, unspecified: Secondary | ICD-10-CM

## 2024-06-25 DIAGNOSIS — F0280 Dementia in other diseases classified elsewhere without behavioral disturbance: Secondary | ICD-10-CM

## 2024-06-25 DIAGNOSIS — E1122 Type 2 diabetes mellitus with diabetic chronic kidney disease: Secondary | ICD-10-CM

## 2024-06-25 DIAGNOSIS — C9000 Multiple myeloma not having achieved remission: Secondary | ICD-10-CM

## 2024-06-25 DIAGNOSIS — N1832 Chronic kidney disease, stage 3b: Secondary | ICD-10-CM | POA: Diagnosis not present

## 2024-06-25 DIAGNOSIS — S51812A Laceration without foreign body of left forearm, initial encounter: Secondary | ICD-10-CM | POA: Diagnosis not present

## 2024-06-25 NOTE — Assessment & Plan Note (Signed)
 Lab Results  Component Value Date   HGBA1C 10.6 (A) 04/13/2024   Per endo

## 2024-06-25 NOTE — Assessment & Plan Note (Signed)
Per [place] [facility] 

## 2024-06-25 NOTE — Assessment & Plan Note (Signed)
 Per oncology

## 2024-06-25 NOTE — Progress Notes (Signed)
 "  Subjective:    Patient ID: [name]    DOB: 1931-12-15, 89 y.o.   MRN: [id number]  Chief Complaint  Patient presents with   ED visit    HPI Patient is in today for er f/u.  Discussed the use of AI scribe software for clinical note transcription with the patient, who gave verbal consent to proceed.  History of Present Illness [name] is a 89 year old [name] who presents with a recent fall resulting in rib fractures and ongoing balance issues.  His son present.    He experienced a fall after getting up from bed, resulting in a skin tear on his arm and multiple rib fractures. No head injury occurred during the fall. Since the incident, he has been under constant supervision with the use of a bed alarm, bed rail, and chair alarm to prevent further falls. He uses a walker to aid in mobility, which has improved his balance and steadiness.  He has a history of rib fractures and a compression fracture in his back, which caused significant pain and limited mobility. The pain from the rib fractures was severe, but he has been recovering with assistance. He is currently undergoing physical therapy twice a week to improve his balance and mobility, with sessions scheduled at an outpatient rehab facility.  He has been experiencing worsening memory issues, including difficulty remembering names and occasional hallucinations, which have improved somewhat. He is under the care of a neurologist who will re-evaluate him in March.  He has a history of vascular issues, with a noted narrowing of a vein and calcification of the aorta observed during a hospital visit. He is awaiting follow-up with a gastroenterologist and needs a new vascular specialist due to insurance network changes.  No dizziness at the time of the fall, but his legs gave out on him.    Past Medical History:  Diagnosis Date   Anemia, unspecified 10/27/2009   BPH with urinary obstruction 12/13/2012   CAD (coronary artery  disease)    LHC 03/25/11 by Dr. Wonda:  pLAD 99%, oCFX 20-30%, pOM1 40%, dAVCFX 20-30%.  EF was normal on nuclear study.  He was treated with a Promus DES to his pLAD.    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/2023   Monoclonal gammopathy 04/06/2019   Multiple closed fractures of ribs of left side 03/21/2019   Multiple myeloma not having achieved remission 08/13/2022   Osteoporosis 03/21/2019   Peripheral vascular disorder due to diabetes mellitus 08/13/2022   Personal history of colonic polyps 1996   villous adenoma   Primary open angle glaucoma (POAG) of left eye, mild stage 02/04/2019   Primary open angle glaucoma (POAG) of right eye, moderate stage 02/04/2019   Prostate cancer 02/15/2017   Stage 3b chronic kidney disease 02/06/2020   Type II diabetes mellitus 07/04/2006   Vitamin D  deficiency 01/28/2021    Past Surgical History:  Procedure Laterality Date   CARDIAC CATHETERIZATION  03/25/2011   cataract Bilateral 2010   corenea implants  june and sept 2018   dr donnice [facility]   CORONARY STENT PLACEMENT  03/25/2011   RADIOACTIVE SEED IMPLANT N/A 05/06/2017   Procedure: RADIOACTIVE  SEED IMPLANT/BRACHYTHERAPY IMPLANT;  Surgeon: Ottelin, Mark, MD;  Location: [facility];  Service: Urology;  Laterality: N/A;   SPACE OAR INSTILLATION N/A 05/06/2017   Procedure: SPACE OAR INSTILLATION;  Surgeon: Ottelin, Mark, MD;  Location: [facility];  Service: Urology;  Laterality: N/A;    Family History  Problem Relation Age of Onset   Stomach cancer Mother 31   Coronary artery disease Father 4       deceased   Healthy Son    Healthy  Son    Healthy Son     Social History   Socioeconomic History   Marital status: Widowed    Spouse name: Not on file   Number of children: Not on file   Years of education: 14   Highest education level: Associate degree: academic program  Occupational History   Occupation: Retired  Tobacco Use   Smoking status: Former    Current packs/day: 0.00    Average packs/day: 0.3 packs/day for 3.0 years (0.8 ttl pk-yrs)    Types: Cigarettes    Start date: 06/08/1955    Quit date: 06/07/1958    Years since quitting: 66.0   Smokeless tobacco: Never  Vaping Use   Vaping status: Never Used  Substance and Sexual Activity   Alcohol  use: Not Currently   Drug use: No   Sexual activity: Not Currently  Other Topics Concern   Not on file  Social History Narrative   Right   Lives with son    Retired   Two story home   Caffeine  2 cup a day   Still drives   Social Drivers of Health   Tobacco Use: Medium Risk (06/25/2024)   Patient History    Smoking Tobacco Use: Former    Smokeless Tobacco Use: Never    Passive Exposure: Not on Actuary Strain: Low Risk (04/16/2024)   Overall Financial Resource Strain (CARDIA)    Difficulty of Paying Living Expenses: Not hard at all  Food Insecurity: No Food Insecurity (04/16/2024)   Epic    Worried About Programme Researcher, Broadcasting/film/video in the Last Year: Never true    Ran Out of Food in the Last Year: Never true  Transportation Needs: No Transportation Needs (04/16/2024)   Epic    Lack of Transportation (Medical): No    Lack of Transportation (Non-Medical): No  Physical Activity: Insufficiently Active (04/16/2024)   Exercise Vital Sign    Days of Exercise per Week: 2 days    Minutes of Exercise per Session: 20 min  Stress: No Stress Concern Present (04/16/2024)   [employer] of Occupational Health - Occupational Stress Questionnaire    Feeling of Stress: Not at all  Social Connections: Moderately Integrated (04/16/2024)   Social  Connection and Isolation Panel    Frequency of Communication with Friends and Family: More than three times a week    Frequency of Social Gatherings with Friends and Family: More than three times a week    Attends Religious Services: More than 4 times per year    Active Member of [employer] or Organizations: Yes    Attends Banker Meetings: More than 4 times per year    Marital Status: Widowed  Intimate Partner Violence: Not At Risk (09/17/2022)   Humiliation, Afraid, Rape, and Kick questionnaire    Fear of Current or Ex-Partner: No    Emotionally Abused: No    Physically Abused: No    Sexually Abused: No  Depression (PHQ2-9):  Low Risk (11/15/2023)   Depression (PHQ2-9)    PHQ-2 Score: 0  Alcohol  Screen: Low Risk (04/16/2024)   Alcohol  Screen    Last Alcohol  Screening Score (AUDIT): 1  Housing: Low Risk (04/16/2024)   Epic    Unable to Pay for Housing in the Last Year: No    Number of Times Moved in the Last Year: 0    Homeless in the Last Year: No  Utilities: Not At Risk (11/15/2023)   AHC Utilities    Threatened with loss of utilities: No  Health Literacy: Adequate Health Literacy (11/15/2023)   B1300 Health Literacy    Frequency of need for help with medical instructions: Rarely    Outpatient Medications Prior to Visit  Medication Sig Dispense Refill   acetaminophen  (TYLENOL ) 500 MG tablet Take 2 tablets (1,000 mg total) by mouth every 6 (six) hours as needed. 60 tablet 0   Alcohol  Swabs  PADS Use as directed once a day 100 each 1   alendronate  (FOSAMAX ) 70 MG tablet TAKE 1 TABLET ONCE WEEKLY *TAKE WITH A FULL GLASS OF WATER ON AN EMPTY STOMACH* 12 tablet 0   amLODipine  (NORVASC ) 5 MG tablet Take by mouth.     aspirin 81 MG tablet Take 81 mg by mouth daily.     atorvastatin  (LIPITOR) 20 MG tablet TAKE 1 TABLET EVERY DAY 90 tablet 3   Blood Glucose Calibration (TRUE METRIX LEVEL 1) Low SOLN USE AS DIRECTED 1 each 3   Blood Glucose Calibration (TRUE METRIX LEVEL 3) High  SOLN Use to check controls on glucometer strips every 30 days or with each new bottle of strips (whichever comes first). 3 each 1   Blood Glucose Monitoring Suppl (TRUE METRIX METER) w/Device KIT USE AS DIRECTED ONCE A DAY 1 kit 0   brimonidine (ALPHAGAN) 0.2 % ophthalmic solution SMARTSIG:In Eye(s)     brimonidine-timolol (COMBIGAN) 0.2-0.5 % ophthalmic solution Place 1 drop into both eyes 2 (two) times daily.     COLLAGEN PO Take by mouth. 1 scoop daily     cyclobenzaprine  (FLEXERIL ) 5 MG tablet Take 1 tablet (5 mg total) by mouth 2 (two) times daily as needed for muscle spasms. 30 tablet 2   diclofenac  Sodium (VOLTAREN ) 1 % GEL Apply 4 g topically 4 (four) times daily. 100 g 0   dorzolamide-timolol (COSOPT) 2-0.5 % ophthalmic solution SMARTSIG:In Eye(s)     empagliflozin  (JARDIANCE ) 25 MG TABS tablet TAKE 1 TABLET EVERY DAY BEFORE BREAKFAST 90 tablet 1   fish oil-omega-3 fatty acids 1000 MG capsule Take 2 g by mouth daily.     glucose blood (TRUE METRIX BLOOD GLUCOSE TEST) test strip TEST BLOOD SUGAR ONE TIME DAILY 100 strip 3   insulin  glargine (LANTUS  SOLOSTAR) 100 UNIT/ML Solostar Pen Inject 30 Units into the skin daily. 30 mL 4   Insulin  Pen Needle 32G X 4 MM MISC 1 Device by Does not apply route daily in the afternoon. 100 each 3   latanoprost (XALATAN) 0.005 % ophthalmic solution SMARTSIG:In Eye(s)     lidocaine  (LIDODERM ) 5 % Place 1 patch onto the skin daily. Remove & Discard patch within 12 hours or as directed by MD 10 patch 0   lidocaine  (LIDODERM ) 5 % Place 1 patch onto the skin daily. Remove & Discard patch within 12 hours or as directed by MD     linagliptin  (TRADJENTA ) 5 MG TABS tablet Take 1 tablet (5 mg total) by mouth daily. 90 tablet 3   losartan -hydrochlorothiazide (HYZAAR) 100-25  MG tablet TAKE 1 TABLET EVERY DAY 90 tablet 3   memantine  (NAMENDA ) 5 MG tablet Take 2 tabs at night and 1 tab in the morning 270 tablet 3   metoprolol  succinate (TOPROL -XL) 50 MG 24 hr tablet TAKE  1 TABLET EVERY DAY WITH OR IMMEDIATELY FOLLOWING A MEAL 90 tablet 3   Multiple Vitamin (MULTIVITAMIN) tablet Take 1 tablet by mouth daily.     NONFORMULARY OR COMPOUNDED ITEM Walker  #1  sig as directed due to frequent falls and balance issues 1 each 0   tamsulosin  (FLOMAX ) 0.4 MG CAPS capsule TAKE 1 CAPSULE EVERY DAY AFTER SUPPER 90 capsule 3   TRUEplus Lancets 33G MISC Use to check blood sugar once a day.  DX  E11.9 100 each 1   vitamin B-12 (CYANOCOBALAMIN ) 100 MCG tablet Take 100 mcg by mouth daily.     No facility-administered medications prior to visit.    No Known Allergies  Review of Systems  Constitutional:  Negative for fever and malaise/fatigue.  HENT:  Negative for congestion.   Eyes:  Negative for blurred vision.  Respiratory:  Negative for shortness of breath.   Cardiovascular:  Negative for chest pain, palpitations and leg swelling.  Gastrointestinal:  Negative for abdominal pain, blood in stool and nausea.  Genitourinary:  Negative for dysuria and frequency.  Musculoskeletal:  Negative for falls.  Skin:  Negative for rash.  Neurological:  Negative for dizziness, loss of consciousness and headaches.  Endo/Heme/Allergies:  Negative for environmental allergies.  Psychiatric/Behavioral:  Negative for depression. The patient is not nervous/anxious.        Objective:    Physical Exam Vitals and nursing note reviewed.  Constitutional:      General: He is not in acute distress.    Appearance: Normal appearance. He is well-developed.  HENT:     Head: Normocephalic and atraumatic.  Eyes:     General: No scleral icterus.       Right eye: No discharge.        Left eye: No discharge.  Cardiovascular:     Rate and Rhythm: Normal rate and regular rhythm.     Heart sounds: No murmur heard. Pulmonary:     Effort: Pulmonary effort is normal. No respiratory distress.     Breath sounds: Normal breath sounds.  Musculoskeletal:        General: Normal range of motion.      Cervical back: Normal range of motion and neck supple.     Right lower leg: No edema.     Left lower leg: No edema.  Skin:    General: Skin is warm and dry.  Neurological:     Mental Status: He is alert and oriented to person, place, and time.  Psychiatric:        Mood and Affect: Mood normal.        Behavior: Behavior normal.        Thought Content: Thought content normal.        Judgment: Judgment normal.     BP 100/70 (BP Location: Right Arm, Patient Position: Sitting, Cuff Size: Normal)   Pulse 87   Temp 97.8 F (36.6 C) (Oral)   Resp 16   Ht 5' 4 (1.626 m)   Wt 132 lb 3.2 oz (60 kg)   SpO2 95%   BMI 22.69 kg/m  Wt Readings from Last 3 Encounters:  06/25/24 132 lb 3.2 oz (60 kg)  06/17/24 135 lb (61.2 kg)  06/14/24 127 lb (57.6  kg)    Diabetic Foot Exam - Simple   No data filed    Lab Results  Component Value Date   WBC 13.0 (H) 06/12/2024   HGB 13.7 06/12/2024   HCT 41.7 06/12/2024   PLT 224.0 06/12/2024   GLUCOSE 93 06/12/2024   CHOL 94 06/12/2024   TRIG 180.0 (H) 06/12/2024   HDL 24.50 (L) 06/12/2024   LDLCALC 34 06/12/2024   ALT 17 06/12/2024   AST 19 06/12/2024   NA 138 06/12/2024   K 3.4 (L) 06/12/2024   CL 99 06/12/2024   CREATININE 1.65 (H) 06/12/2024   BUN 36 (H) 06/12/2024   CO2 30 06/12/2024   TSH 5.69 (H) 06/12/2024   PSA 0.03 (L) 08/13/2022   INR 1.03 05/02/2017   HGBA1C 10.6 (A) 04/13/2024   MICROALBUR 21.5 (H) 02/16/2024    Lab Results  Component Value Date   TSH 5.69 (H) 06/12/2024   Lab Results  Component Value Date   WBC 13.0 (H) 06/12/2024   HGB 13.7 06/12/2024   HCT 41.7 06/12/2024   MCV 89.9 06/12/2024   PLT 224.0 06/12/2024   Lab Results  Component Value Date   NA 138 06/12/2024   K 3.4 (L) 06/12/2024   CO2 30 06/12/2024   GLUCOSE 93 06/12/2024   BUN 36 (H) 06/12/2024   CREATININE 1.65 (H) 06/12/2024   BILITOT 0.6 06/12/2024   ALKPHOS 123 (H) 06/12/2024   AST 19 06/12/2024   ALT 17 06/12/2024   PROT 6.4  06/12/2024   ALBUMIN 3.6 06/12/2024   CALCIUM  8.5 06/12/2024   ANIONGAP 15 06/06/2024   GFR 36.37 (L) 06/12/2024   Lab Results  Component Value Date   CHOL 94 06/12/2024   Lab Results  Component Value Date   HDL 24.50 (L) 06/12/2024   Lab Results  Component Value Date   LDLCALC 34 06/12/2024   Lab Results  Component Value Date   TRIG 180.0 (H) 06/12/2024   Lab Results  Component Value Date   CHOLHDL 4 06/12/2024   Lab Results  Component Value Date   HGBA1C 10.6 (A) 04/13/2024       Assessment & Plan:  Skin tear of forearm without complication, left, initial encounter  Type 2 diabetes mellitus with stage 3b chronic kidney disease, without long-term current use of insulin  (HCC) Assessment & Plan: Lab Results  Component Value Date   HGBA1C 10.6 (A) 04/13/2024   Per endo   Stage 3b chronic kidney disease Assessment & Plan: Per [facility]   Multiple myeloma not having achieved remission Assessment & Plan: Per oncology   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 The wound is healing well with good scab formation and no signs of infection, though monitoring is necessary. Monitor the skin tear for signs of infection such as increased redness, heat, or discharge. Contact the clinic if signs of infection develop for potential antibiotic treatment.  Gait disturbance with impaired balance   His gait disturbance and impaired balance are likely exacerbated by the recent fall and rib fractures. Improvement is noted with the use of a walker,  and physical therapy has been initiated to address balance issues. Continue physical  therapy sessions twice a week for up to 12 weeks. Ensure use of the walker for mobility to prevent falls. Monitor for any changes in balance or gait.  Dementia with behavioral disturbance   He has dementia with worsening memory issues and behavioral disturbances, including hallucinations. A recent evaluation by Dr. Wilder noted improvement in hallucinations but persistent memory issues. The family is considering [facility] for additional support and activities. Continue follow-up with Dr. Wilder for dementia management. Explore [facility] for daytime support and activities. Monitor for changes in behavior or memory.  History of rib and vertebral fractures   He has a history of multiple rib and vertebral fractures with significant pain previously. Current management includes mobility aids and physical therapy to improve function and prevent further injury. Continue use of mobility aids such as a walker to prevent falls. Continue physical therapy to improve mobility and strength.   Aurilla Coulibaly R [name], DO  "

## 2024-06-25 NOTE — Assessment & Plan Note (Signed)
 Per neuro Memory seems to be getting worse---  son states they have someone with him all the time now They are looking into a day program at [facility] for [facility] And a 1 story home

## 2024-06-26 ENCOUNTER — Other Ambulatory Visit: Payer: Self-pay

## 2024-06-26 ENCOUNTER — Ambulatory Visit

## 2024-06-26 DIAGNOSIS — R262 Difficulty in walking, not elsewhere classified: Secondary | ICD-10-CM | POA: Diagnosis not present

## 2024-06-26 DIAGNOSIS — R296 Repeated falls: Secondary | ICD-10-CM

## 2024-06-26 NOTE — Therapy (Signed)
 " OUTPATIENT PHYSICAL THERAPY LOWER EXTREMITY TREATMENT   Patient Name: [name] MRN: [id number] DOB:May 02, 1932, 89 y.o., male Today's Date: 06/26/2024  END OF SESSION:  PT End of Session - 06/26/24 0818     Visit Number 2    Date for Recertification  09/13/24    Progress Note Due on Visit 10    PT Start Time 0803    PT Stop Time 0845    PT Time Calculation (min) 42 min    Activity Tolerance Patient tolerated treatment well    Behavior During Therapy [facility] for tasks assessed/performed           Past Medical History:  Diagnosis Date   Anemia, unspecified 10/27/2009   BPH with urinary obstruction 12/13/2012   CAD (coronary artery disease)    LHC 03/25/11 by Dr. Wonda:  pLAD 99%, oCFX 20-30%, pOM1 40%, dAVCFX 20-30%.  EF was normal on nuclear study.  He was treated with a Promus DES to his pLAD.    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/2023   Monoclonal gammopathy 04/06/2019   Multiple closed fractures of ribs of left side 03/21/2019   Multiple myeloma not having achieved remission 08/13/2022   Osteoporosis 03/21/2019   Peripheral vascular disorder due to diabetes mellitus 08/13/2022   Personal history of colonic polyps 1996   villous adenoma   Primary open angle glaucoma (POAG) of left eye, mild stage 02/04/2019   Primary open angle glaucoma (POAG) of right eye, moderate stage 02/04/2019   Prostate cancer 02/15/2017   Stage 3b chronic kidney disease 02/06/2020   Type II diabetes mellitus 07/04/2006   Vitamin D  deficiency 01/28/2021   Past Surgical History:  Procedure Laterality Date   CARDIAC CATHETERIZATION  03/25/2011   cataract Bilateral  2010   corenea implants  june and sept 2018   dr donnice [facility]   CORONARY STENT PLACEMENT  03/25/2011   RADIOACTIVE SEED IMPLANT N/A 05/06/2017   Procedure: RADIOACTIVE SEED IMPLANT/BRACHYTHERAPY IMPLANT;  Surgeon: Ottelin, Mark, MD;  Location: [facility];  Service: Urology;  Laterality: N/A;   SPACE OAR INSTILLATION N/A 05/06/2017   Procedure: SPACE OAR INSTILLATION;  Surgeon: Ottelin, Mark, MD;  Location: [facility];  Service: Urology;  Laterality: N/A;   Patient Active Problem List   Diagnosis Date Noted   Skin tear of forearm without complication, left, initial encounter 06/25/2024   Hypotension due to hypovolemia 04/13/2024   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 02/22/2023   Macular degeneration    Multiple myeloma not having achieved remission 08/13/2022   Lambda light chain disease 08/13/2022   Peripheral vascular disorder due to diabetes mellitus 08/13/2022   Vitamin D  deficiency 01/28/2021   Stage 3b chronic kidney disease 02/06/2020   Diabetic polyneuropathy 02/06/2020   Hyperlipidemia associated with type 2 diabetes mellitus 04/06/2019   Monoclonal gammopathy 04/06/2019   Osteoporosis 03/21/2019   Primary open angle glaucoma (POAG) of left eye, mild stage 02/04/2019   Primary open angle glaucoma (POAG) of right eye, moderate stage 02/04/2019   Prostate cancer 02/15/2017   Fuchs' corneal dystrophy 08/18/2016   BPH with urinary obstruction 12/13/2012   CAD (coronary artery disease) 04/09/2011   Gastritis and gastroduodenitis  05/25/2010   Anemia, unspecified 10/27/2009   Type II diabetes mellitus with stage 3 chronic kidney disease (HCC) 07/04/2006   Essential hypertension 07/04/2006   History of colonic polyps 1996    PCP: Antonio Cyndee Rockers DO  REFERRING PROVIDER: same  REFERRING DIAG: unsteady gait, balance disorder  THERAPY DIAG:  Repeated falls  Difficulty in walking, not elsewhere classified  Rationale for Evaluation  and Treatment: Rehabilitation  ONSET DATE: several months  SUBJECTIVE:   SUBJECTIVE STATEMENT: Patient very pleasant, reports unremitting upper back pain which doesn't change but states it is getting better  PERTINENT HISTORY: Son describes recent decline in last few weeks, months and several falls, had T spine compression fracture that was present for several months,  this weekend he fell again and fractured several ribs . Recent incidences of hallucinations and confusion.  Pt lives with one son, has 3 son and one daughter in law within the area who are rotating supervision for pt due to his recent decline in mental acuity and his falls. They have obtained a bed alarm, his bedroom and bathroom are upstairs.  Also he is recently using a rollator in and out of the house.  The pt has been very physically active, participated in the senior games in April and won the 100 and 200 meter dash.   His decline in physical stability and mental acuity have occurred rather recently and suddenly.  PAIN:  Are you having pain? Frequently c/o today of lower back pain across T 9 region  PRECAUTIONS: None  RED FLAGS: None   WEIGHT BEARING RESTRICTIONS: No  FALLS:  Has patient fallen in last 6 months? No  LIVING ENVIRONMENT: Lives with: lives with their family Lives in: House/apartment Stairs: Yes: Internal: flight steps; on right going up Has following equipment at home: Vannie - 4 wheeled  OCCUPATION: retired  PLOF: Independent  PATIENT GOALS: son's goal is to ensure pts safety, patient is concerned about his back pain  NEXT MD VISIT: 1 month  OBJECTIVE:  Note: Objective measures were completed at Evaluation unless otherwise noted.  DIAGNOSTIC FINDINGS: MRI brain negative T5 compression fx unknown origin and age 78 CT head C-spine thoracic spine showed no acute traumatic findings.  Elbow looks okay.  Multiple rib fractures on the left and right seen on CT chest.    PATIENT SURVEYS:   TBD  COGNITION: Overall cognitive status: History of cognitive impairments - at baseline     SENSATION: WFL  EDEMA:  None noted  MUSCLE LENGTH:wfl POSTURE: lean healthy male, mild increased mid thoracic flexion, lumbar flexion in sitting and standing  PALPATION: C/o pain around T 8 level, across lower back  LOWER EXTREMITY ROM: wfl   LOWER EXTREMITY MMT: all grossly wnl, not assessed in side lying and supine   FUNCTIONAL TESTS:  Babel Balance Scale:  Item Test date: 06/21/24 Date:  Date:   Sitting to standing 4. able to stand without using hands and stabilize independently Insert SmartPhrase OPRCBERGREEVAL Insert SmartPhrase OPRCBERGREEVAL  2. Standing unsupported 4. able to stand safely for 2 minutes    3. Sitting with back unsupported, feet supported 4. able to sit safely and securely for 2 minutes    4. Standing to sitting 4. sits safely with minimal use of hands    5. Pivot transfer  4. able to transfer safely with minor use of hands    6. Standing unsupported with eyes closed 4. able to stand 10 seconds safely    7. Standing unsupported  with feet together 4. able to place feet together independently and stand 1 minute safely    8. Reaching forward with outstretched arms while standing 3. can reach forward 12 cm (5 inches)    9. Pick up object from the floor from standing 3. able to pick up slipper but needs supervision    10. Turning to look behind over left and right shoulders while standing 4. looks behind from both sides and weight shifts well    11. Turn 360 degrees 4. able to turn 360 degrees safely in 4 seconds or less    12. Place alternate foot on step or stool while standing unsupported 1. able to complete > 2 steps needs minimal assist    13. Standing unsupported one foot in front 0. loses balance while stepping or standing    14. Standing on one leg 1. tries to lift leg unable to hold 3 seconds but remains standing independently.      Total Score 48/56 Total  Score:    Total Score:     30 sec sit to stand:  17 GAIT: Distance walked: in clinic 70' Assistive device utilized: Environmental Consultant - 4 wheeled Level of assistance: SBA Comments: fast cadence                                                                                                                                 TREATMENT DATE: 06/26/24: Gait with rollator 5 laps around clinic, 450' Standing at sink on airex for heel/ toe rocks, light B UE support and CGA  Standing at sink single arm support semi tandem for head nods and vertical head movements 10 reps each, alternating lead foot. Standing at sink with one foot on airex, forward lunges, raising on forefeet B 15 x each Post lean against sink, B heel rocks, 15 x Seated rows with blue t band under feet 15 x \ Standing with 1# cuff weights around each ankle for side stepping along 10' counter 4 lengths Gait x 300' with rollator in long hallway Standing at sink for alt hip extension    06/21/24: Evaluation, education/ recommended to pt and son possibly adding bed rail to assist with further fall prevention when getting out of bed and add grab bars outside of shower.      PATIENT EDUCATION:  Education details: POC, goals Person educated: Patient Education method: Explanation, Demonstration, Tactile cues, and Verbal cues Education comprehension: verbalized understanding, returned demonstration, verbal cues required, tactile cues required, and needs further education  HOME EXERCISE PROGRAM: TBD  ASSESSMENT:  CLINICAL IMPRESSION: Patient is a 89 y.o. male participated today in physical therapy treatment for recent decline in mental acuity and recent falls.  Today we addressed a combination of righting reactions, particularly focusing on ankle stability and control and narrowed stance dynamic activities.  We did attempt the Nustep for cardiovascular training but it exacerbated his mid back pain.  He did demonstrate one episode  today of  balance loss in which he moved very quickly and leaned on his rollator which he had not locked , he did required hands on assist to maintain his balance.  He should benefit from supervised exercise with combined cardiovascular training and balance training with physical therapy to further assist him with fall prevention. T OBJECTIVE IMPAIRMENTS: decreased balance, decreased cognition, decreased knowledge of condition, decreased knowledge of use of DME, difficulty walking, decreased safety awareness, and pain.   ACTIVITY LIMITATIONS: carrying, transfers, and locomotion level  PARTICIPATION LIMITATIONS: community activity  PERSONAL FACTORS: Age, Time since onset of injury/illness/exacerbation, and 1-2 comorbidities: alzheimers are also affecting patient's functional outcome.   REHAB POTENTIAL: Good  CLINICAL DECISION MAKING: Evolving/moderate complexity  EVALUATION COMPLEXITY: Moderate   GOALS: Goals reviewed with patient? Yes  SHORT TERM GOALS: Target date:  2 weeks, 07/05/24 I HEP Baseline: Goal status: INITIAL   LONG TERM GOALS: Target date: 09/13/24, 12 weeks  [place] improve from 48/56 to 53/56 Baseline:  Goal status: INITIAL  2.  FGA 27/30 Baseline: TBD Goal status: INITIAL  3.  Improve righting reactions, able to heel rock without loss of balance Baseline:  Goal status: INITIAL   PLAN:  PT FREQUENCY: 2x/week  PT DURATION: 12 weeks  PLANNED INTERVENTIONS: 97110-Therapeutic exercises, 97530- Therapeutic activity, 97112- Neuromuscular re-education, 97535- Self Care, 02859- Manual therapy, and Patient/Family education  PLAN FOR NEXT SESSION: address cardivascular conditioning, balance training   Verlie Hellenbrand L Anetha Slagel, PT, DPT, OCS 06/26/2024, 9:46 AM  "

## 2024-06-29 ENCOUNTER — Ambulatory Visit

## 2024-06-29 DIAGNOSIS — R262 Difficulty in walking, not elsewhere classified: Secondary | ICD-10-CM | POA: Diagnosis not present

## 2024-06-29 DIAGNOSIS — R296 Repeated falls: Secondary | ICD-10-CM

## 2024-06-29 NOTE — Therapy (Signed)
 " OUTPATIENT PHYSICAL THERAPY LOWER EXTREMITY TREATMENT   Patient Name: [name] MRN: [id number] DOB:[birth year], 89 y.o., male Today's Date: 06/29/2024  END OF SESSION:  PT End of Session - 06/29/24 0803     Visit Number 3    Date for Recertification  09/13/24    Progress Note Due on Visit 10    PT Start Time 0800    PT Stop Time 0841    PT Time Calculation (min) 41 min    Activity Tolerance Patient tolerated treatment well    Behavior During Therapy [facility] for tasks assessed/performed            Past Medical History:  Diagnosis Date   Anemia, unspecified 10/27/2009   BPH with urinary obstruction 12/13/2012   CAD (coronary artery disease)    LHC 03/25/11 by Dr. Wonda:  pLAD 99%, oCFX 20-30%, pOM1 40%, dAVCFX 20-30%.  EF was normal on nuclear study.  He was treated with a Promus DES to his pLAD.    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/2023   Monoclonal gammopathy 04/06/2019   Multiple closed fractures of ribs of left side 03/21/2019   Multiple myeloma not having achieved remission 08/13/2022   Osteoporosis 03/21/2019   Peripheral vascular disorder due to diabetes mellitus 08/13/2022   Personal history of colonic polyps 1996   villous adenoma   Primary open angle glaucoma (POAG) of left eye, mild stage 02/04/2019   Primary open angle glaucoma (POAG) of right eye, moderate stage 02/04/2019   Prostate cancer 02/15/2017   Stage 3b chronic kidney disease 02/06/2020   Type II diabetes mellitus 07/04/2006   Vitamin D  deficiency 01/28/2021   Past Surgical History:  Procedure Laterality Date   CARDIAC CATHETERIZATION  03/25/2011   cataract  Bilateral 2010   corenea implants  june and sept 2018   dr [facility]nice [facility]   CORONARY STENT PLACEMENT  03/25/2011   RADIOACTIVE SEED IMPLANT N/A 05/06/2017   Procedure: RADIOACTIVE SEED IMPLANT/BRACHYTHERAPY IMPLANT;  Surgeon: Ottelin, Mark, MD;  Location: [facility] [place];  Service: Urology;  Laterality: N/A;   SPACE OAR INSTILLATION N/A 05/06/2017   Procedure: SPACE OAR INSTILLATION;  Surgeon: Ottelin, Mark, MD;  Location: [facility];  Service: Urology;  Laterality: N/A;   Patient Active Problem List   Diagnosis Date Noted   Skin tear of forearm without complication, left, initial encounter 06/25/2024   Hypotension due to hypovolemia 04/13/2024   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 02/22/2023   Macular degeneration    Multiple myeloma not having achieved remission 08/13/2022   Lambda light chain disease 08/13/2022   Peripheral vascular disorder due to diabetes mellitus 08/13/2022   Vitamin D  deficiency 01/28/2021   Stage 3b chronic kidney disease 02/06/2020   Diabetic polyneuropathy 02/06/2020   Hyperlipidemia associated with type 2 diabetes mellitus 04/06/2019   Monoclonal gammopathy 04/06/2019   Osteoporosis 03/21/2019   Primary open angle glaucoma (POAG) of left eye, mild stage 02/04/2019   Primary open angle glaucoma (POAG) of right eye, moderate stage 02/04/2019   Prostate cancer 02/15/2017   Fuchs' corneal dystrophy 08/18/2016   BPH with urinary obstruction 12/13/2012   CAD (coronary artery disease) 04/09/2011   Gastritis and  gastroduodenitis 05/25/2010   Anemia, unspecified 10/27/2009   Type II diabetes mellitus with stage 3 chronic kidney disease (HCC) 07/04/2006   Essential hypertension 07/04/2006   History of colonic polyps 1996    PCP: Antonio Cyndee Rockers DO  REFERRING PROVIDER: same  REFERRING DIAG: unsteady gait, balance disorder  THERAPY DIAG:  Repeated falls  Difficulty in walking, not elsewhere classified  Rationale for  Evaluation and Treatment: Rehabilitation  ONSET DATE: several months  SUBJECTIVE:   SUBJECTIVE STATEMENT:   PERTINENT HISTORY: Son describes recent decline in last few weeks, months and several falls, had T spine compression fracture that was present for several months,  this weekend he fell again and fractured several ribs . Recent incidences of hallucinations and confusion.  Pt lives with one son, has 3 son and one daughter in law within the area who are rotating supervision for pt due to his recent decline in mental acuity and his falls. They have obtained a bed alarm, his bedroom and bathroom are upstairs.  Also he is recently using a rollator in and out of the house.  The pt has been very physically active, participated in the senior games in April and won the 100 and 200 meter dash.   His decline in physical stability and mental acuity have occurred rather recently and suddenly.  PAIN:  Are you having pain? Frequently c/o today of lower back pain across T 9 region  PRECAUTIONS: None  RED FLAGS: None   WEIGHT BEARING RESTRICTIONS: No  FALLS:  Has patient fallen in last 6 months? No  LIVING ENVIRONMENT: Lives with: lives with their family Lives in: House/apartment Stairs: Yes: Internal: flight steps; on right going up Has following equipment at home: Vannie - 4 wheeled  OCCUPATION: retired  PLOF: Independent  PATIENT GOALS: son's goal is to ensure pts safety, patient is concerned about his back pain  NEXT MD VISIT: 1 month  OBJECTIVE:  Note: Objective measures were completed at Evaluation unless otherwise noted.  DIAGNOSTIC FINDINGS: MRI brain negative T5 compression fx unknown origin and age 48 CT head C-spine thoracic spine showed no acute traumatic findings.  Elbow looks okay.  Multiple rib fractures on the left and right seen on CT chest.    PATIENT SURVEYS:  TBD  COGNITION: Overall cognitive status: History of cognitive impairments - at  baseline     SENSATION: WFL  EDEMA:  None noted  MUSCLE LENGTH:wfl POSTURE: lean healthy male, mild increased mid thoracic flexion, lumbar flexion in sitting and standing  PALPATION: C/o pain around T 8 level, across lower back  LOWER EXTREMITY ROM: wfl   LOWER EXTREMITY MMT: all grossly wnl, not assessed in side lying and supine   FUNCTIONAL TESTS:  Erazo Balance Scale:  Item Test date: 06/21/24 Date:  Date:   Sitting to standing 4. able to stand without using hands and stabilize independently Insert SmartPhrase OPRCBERGREEVAL Insert SmartPhrase OPRCBERGREEVAL  2. Standing unsupported 4. able to stand safely for 2 minutes    3. Sitting with back unsupported, feet supported 4. able to sit safely and securely for 2 minutes    4. Standing to sitting 4. sits safely with minimal use of hands    5. Pivot transfer  4. able to transfer safely with minor use of hands    6. Standing unsupported with eyes closed 4. able to stand 10 seconds safely    7. Standing unsupported with feet together 4. able to place feet together independently and stand 1 minute safely  8. Reaching forward with outstretched arms while standing 3. can reach forward 12 cm (5 inches)    9. Pick up object from the floor from standing 3. able to pick up slipper but needs supervision    10. Turning to look behind over left and right shoulders while standing 4. looks behind from both sides and weight shifts well    11. Turn 360 degrees 4. able to turn 360 degrees safely in 4 seconds or less    12. Place alternate foot on step or stool while standing unsupported 1. able to complete > 2 steps needs minimal assist    13. Standing unsupported one foot in front 0. loses balance while stepping or standing    14. Standing on one leg 1. tries to lift leg unable to hold 3 seconds but remains standing independently.      Total Score 48/56 Total Score:    Total Score:     30 sec sit to stand:  17 GAIT: Distance walked: in  clinic 2' Assistive device utilized: Environmental Consultant - 4 wheeled Level of assistance: SBA Comments: fast cadence                                                                                                                                 TREATMENT DATE: 06/29/24 Nustep L4x32min UE/LE HS curls 10lb 2x10 BLE Knee extensions 5lb 2x10 BLE  Standing heel/toe rocks x 10 B Standing hip abduction x 10 B 1lb Standing hip extension x 10 B 1lb Standing marching x 10 B 1lb Sidestepping at counter 1lb  Standing rows and shoulder ext GTB  Semi tandem stance 2x15 sec  Semi tandem stance with head turns and head nods  06/26/24: Gait with rollator 5 laps around clinic, 450' Standing at sink on airex for heel/ toe rocks, light B UE support and CGA  Standing at sink single arm support semi tandem for head nods and vertical head movements 10 reps each, alternating lead foot. Standing at sink with one foot on airex, forward lunges, raising on forefeet B 15 x each Post lean against sink, B heel rocks, 15 x Seated rows with blue t band under feet 15 x \ Standing with 1# cuff weights around each ankle for side stepping along 10' counter 4 lengths Gait x 300' with rollator in long hallway Standing at sink for alt hip extension    06/21/24: Evaluation, education/ recommended to pt and son possibly adding bed rail to assist with further fall prevention when getting out of bed and add grab bars outside of shower.      PATIENT EDUCATION:  Education details: POC, goals Person educated: Patient Education method: Explanation, Demonstration, Tactile cues, and Verbal cues Education comprehension: verbalized understanding, returned demonstration, verbal cues required, tactile cues required, and needs further education  HOME EXERCISE PROGRAM: TBD  ASSESSMENT:  CLINICAL IMPRESSION: Pt did very well today, we did hip strengthening, postural strengthening, and balance  activities. Pt does require a lot of direction  at times with the exercises, seemingly due to his cognition. Worked on his righting reactions as well with more LOB to the R side. He was able to ambulate around clinic no AD with CGA.  Patient is a 89 y.o. male participated today in physical therapy treatment for recent decline in mental acuity and recent falls.  Today we addressed a combination of righting reactions, particularly focusing on ankle stability and control and narrowed stance dynamic activities.  We did attempt the Nustep for cardiovascular training but it exacerbated his mid back pain.  He did demonstrate one episode today of balance loss in which he moved very quickly and leaned on his rollator which he had not locked , he did required hands on assist to maintain his balance.  He should benefit from supervised exercise with combined cardiovascular training and balance training with physical therapy to further assist him with fall prevention. T OBJECTIVE IMPAIRMENTS: decreased balance, decreased cognition, decreased knowledge of condition, decreased knowledge of use of DME, difficulty walking, decreased safety awareness, and pain.   ACTIVITY LIMITATIONS: carrying, transfers, and locomotion level  PARTICIPATION LIMITATIONS: community activity  PERSONAL FACTORS: Age, Time since onset of injury/illness/exacerbation, and 1-2 comorbidities: alzheimers are also affecting patient's functional outcome.   REHAB POTENTIAL: Good  CLINICAL DECISION MAKING: Evolving/moderate complexity  EVALUATION COMPLEXITY: Moderate   GOALS: Goals reviewed with patient? Yes  SHORT TERM GOALS: Target date:  2 weeks, 07/05/24 I HEP Baseline: Goal status: 06/29/24; need to provide, would need help of son to complete most likely   LONG TERM GOALS: Target date: 09/13/24, 12 weeks  [place] improve from 48/56 to 53/56 Baseline:  Goal status: INITIAL  2.  FGA 27/30 Baseline: TBD Goal status: INITIAL  3.  Improve righting reactions, able to heel rock  without loss of balance Baseline:  Goal status: INITIAL   PLAN:  PT FREQUENCY: 2x/week  PT DURATION: 12 weeks  PLANNED INTERVENTIONS: 97110-Therapeutic exercises, 97530- Therapeutic activity, 97112- Neuromuscular re-education, 97535- Self Care, 02859- Manual therapy, and Patient/Family education  PLAN FOR NEXT SESSION: address cardivascular conditioning, balance training   [name], PTA 06/29/2024, 8:50 AM  "

## 2024-07-01 ENCOUNTER — Other Ambulatory Visit: Payer: Self-pay | Admitting: Family Medicine

## 2024-07-01 DIAGNOSIS — K551 Chronic vascular disorders of intestine: Secondary | ICD-10-CM

## 2024-07-01 DIAGNOSIS — I6523 Occlusion and stenosis of bilateral carotid arteries: Secondary | ICD-10-CM

## 2024-07-02 ENCOUNTER — Telehealth: Payer: Self-pay | Admitting: Family Medicine

## 2024-07-02 NOTE — Telephone Encounter (Signed)
 Copied from CRM #[id number]. Topic: Medical Record Request - Other >> Jul 02, 2024 10:44 AM Robinson DEL wrote: Reason for CRM: Patients son Signe following up on medical health form for patient, states form is from [employer], interior and spatial designer of client placement for a day program for patient to help with his dementia. States form was sent on 1/21 or /122.  Signe [phone]

## 2024-07-03 ENCOUNTER — Ambulatory Visit

## 2024-07-04 NOTE — Telephone Encounter (Signed)
 Will hold for CMA when she returns tomorrow.

## 2024-07-04 NOTE — Telephone Encounter (Signed)
 Copied from CRM #[id number]. Topic: Medical Record Request - Other >> Jul 04, 2024  9:46 AM Logan F wrote: Pt son Signe called back to check the status of the form that was sent on 1/22. He says the facility did not receive the form. Please call Signe to confirm fax and fax number. He did call to check on the status of the form earlier this week and was told he would get a call back but was not called. He says this form is needed as soon as possible as the space where pt is trying to get into is very limited and he does not want him to lose his spot.   Signe [phone]

## 2024-07-05 ENCOUNTER — Encounter: Payer: Self-pay | Admitting: Family Medicine

## 2024-07-06 ENCOUNTER — Other Ambulatory Visit: Payer: Self-pay

## 2024-07-06 ENCOUNTER — Ambulatory Visit

## 2024-07-06 DIAGNOSIS — R262 Difficulty in walking, not elsewhere classified: Secondary | ICD-10-CM | POA: Diagnosis not present

## 2024-07-06 DIAGNOSIS — Z0279 Encounter for issue of other medical certificate: Secondary | ICD-10-CM

## 2024-07-06 DIAGNOSIS — R296 Repeated falls: Secondary | ICD-10-CM

## 2024-07-06 NOTE — Telephone Encounter (Signed)
 Pt dropped off form to be filled out. Please call pt when they are ready to be picked up, needs them as soon as possible since spots are limited.

## 2024-07-06 NOTE — Telephone Encounter (Signed)
 Received

## 2024-07-06 NOTE — Therapy (Signed)
 " OUTPATIENT PHYSICAL THERAPY LOWER EXTREMITY TREATMENT   Patient Name: [name] MRN: [id number] DOB:[birth year], 89 y.o., male Today's Date: 07/06/2024  END OF SESSION:  PT End of Session - 07/06/24 0805     Visit Number 4    Date for Recertification  09/13/24    Progress Note Due on Visit 10    PT Start Time 0800    PT Stop Time 0844    PT Time Calculation (min) 44 min    Activity Tolerance Patient tolerated treatment well    Behavior During Therapy [facility] for tasks assessed/performed             Past Medical History:  Diagnosis Date   Anemia, unspecified 10/27/2009   BPH with urinary obstruction 12/13/2012   CAD (coronary artery disease)    LHC 03/25/11 by Dr. Wonda:  pLAD 99%, oCFX 20-30%, pOM1 40%, dAVCFX 20-30%.  EF was normal on nuclear study.  He was treated with a Promus DES to his pLAD.    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/2023   Monoclonal gammopathy 04/06/2019   Multiple closed fractures of ribs of left side 03/21/2019   Multiple myeloma not having achieved remission 08/13/2022   Osteoporosis 03/21/2019   Peripheral vascular disorder due to diabetes mellitus 08/13/2022   Personal history of colonic polyps 1996   villous adenoma   Primary open angle glaucoma (POAG) of left eye, mild stage 02/04/2019   Primary open angle glaucoma (POAG) of right eye, moderate stage 02/04/2019   Prostate cancer 02/15/2017   Stage 3b chronic kidney disease 02/06/2020   Type II diabetes mellitus 07/04/2006   Vitamin D  deficiency 01/28/2021   Past Surgical History:  Procedure Laterality Date   CARDIAC CATHETERIZATION  03/25/2011   cataract  Bilateral 2010   corenea implants  june and sept 2018   dr donnice [facility]   CORONARY STENT PLACEMENT  03/25/2011   RADIOACTIVE SEED IMPLANT N/A 05/06/2017   Procedure: RADIOACTIVE SEED IMPLANT/BRACHYTHERAPY IMPLANT;  Surgeon: [name], Mark, [name];  Location: [facility];  Service: Urology;  Laterality: N/A;   SPACE OAR INSTILLATION N/A 05/06/2017   Procedure: SPACE OAR INSTILLATION;  Surgeon: [name], Mark, [name];  Location: [facility];  Service: Urology;  Laterality: N/A;   Patient Active Problem List   Diagnosis Date Noted   Skin tear of forearm without complication, left, initial encounter 06/25/2024   Hypotension due to hypovolemia 04/13/2024   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 02/22/2023   Macular degeneration    Multiple myeloma not having achieved remission 08/13/2022   Lambda light chain disease 08/13/2022   Peripheral vascular disorder due to diabetes mellitus 08/13/2022   Vitamin D  deficiency 01/28/2021   Stage 3b chronic kidney disease 02/06/2020   Diabetic polyneuropathy 02/06/2020   Hyperlipidemia associated with type 2 diabetes mellitus 04/06/2019   Monoclonal gammopathy 04/06/2019   Osteoporosis 03/21/2019   Primary open angle glaucoma (POAG) of left eye, mild stage 02/04/2019   Primary open angle glaucoma (POAG) of right eye, moderate stage 02/04/2019   Prostate cancer 02/15/2017   Fuchs' corneal dystrophy 08/18/2016   BPH with urinary obstruction 12/13/2012   CAD (coronary artery disease) 04/09/2011   Gastritis  and gastroduodenitis 05/25/2010   Anemia, unspecified 10/27/2009   Type II diabetes mellitus with stage 3 chronic kidney disease (HCC) 07/04/2006   Essential hypertension 07/04/2006   History of colonic polyps 1996    PCP: Donald Cyndee Rockers DO  REFERRING PROVIDER: same  REFERRING DIAG: unsteady gait, balance disorder  THERAPY DIAG:  Repeated falls  Difficulty in walking, not elsewhere classified  Rationale for  Evaluation and Treatment: Rehabilitation  ONSET DATE: several months  SUBJECTIVE:   SUBJECTIVE STATEMENT: Pt denies any complaints today, denies any pain  PERTINENT HISTORY: Son describes recent decline in last few weeks, months and several falls, had T spine compression fracture that was present for several months,  this weekend he fell again and fractured several ribs . Recent incidences of hallucinations and confusion.  Pt lives with one son, has 3 son and one daughter in law within the area who are rotating supervision for pt due to his recent decline in mental acuity and his falls. They have obtained a bed alarm, his bedroom and bathroom are upstairs.  Also he is recently using a rollator in and out of the house.  The pt has been very physically active, participated in the senior games in April and won the 100 and 200 meter dash.   His decline in physical stability and mental acuity have occurred rather recently and suddenly.  PAIN:  Are you having pain? Frequently c/o today of lower back pain across T 9 region  PRECAUTIONS: None  RED FLAGS: None   WEIGHT BEARING RESTRICTIONS: No  FALLS:  Has patient fallen in last 6 months? No  LIVING ENVIRONMENT: Lives with: lives with their family Lives in: House/apartment Stairs: Yes: Internal: flight steps; on right going up Has following equipment at home: [name] - 4 wheeled  OCCUPATION: retired  PLOF: Independent  PATIENT GOALS: son's goal is to ensure pts safety, patient is concerned about his back pain  NEXT [name] VISIT: 1 month  OBJECTIVE:  Note: Objective measures were completed at Evaluation unless otherwise noted.  DIAGNOSTIC FINDINGS: MRI brain negative T5 compression fx unknown origin and age 49 CT head C-spine thoracic spine showed no acute traumatic findings.  Elbow looks okay.  Multiple rib fractures on the left and right seen on CT chest.    PATIENT SURVEYS:  TBD  COGNITION: Overall cognitive status: History of  cognitive impairments - at baseline     SENSATION: WFL  EDEMA:  None noted  MUSCLE LENGTH:wfl POSTURE: lean healthy male, mild increased mid thoracic flexion, lumbar flexion in sitting and standing  PALPATION: C/o pain around T 8 level, across lower back  LOWER EXTREMITY ROM: wfl   LOWER EXTREMITY MMT: all grossly wnl, not assessed in side lying and supine   FUNCTIONAL TESTS:  [place] Balance Scale:  Item Test date: 06/21/24 Date:  Date:   Sitting to standing 4. able to stand without using hands and stabilize independently Insert SmartPhrase OPRCBERGREEVAL Insert SmartPhrase OPRCBERGREEVAL  2. Standing unsupported 4. able to stand safely for 2 minutes    3. Sitting with back unsupported, feet supported 4. able to sit safely and securely for 2 minutes    4. Standing to sitting 4. sits safely with minimal use of hands    5. Pivot transfer  4. able to transfer safely with minor use of hands    6. Standing unsupported with eyes closed 4. able to stand 10 seconds safely    7. Standing unsupported with feet together 4. able to place  feet together independently and stand 1 minute safely    8. Reaching forward with outstretched arms while standing 3. can reach forward 12 cm (5 inches)    9. Pick up object from the floor from standing 3. able to pick up slipper but needs supervision    10. Turning to look behind over left and right shoulders while standing 4. looks behind from both sides and weight shifts well    11. Turn 360 degrees 4. able to turn 360 degrees safely in 4 seconds or less    12. Place alternate foot on step or stool while standing unsupported 1. able to complete > 2 steps needs minimal assist    13. Standing unsupported one foot in front 0. loses balance while stepping or standing    14. Standing on one leg 1. tries to lift leg unable to hold 3 seconds but remains standing independently.      Total Score 48/56 Total Score:    Total Score:     30 sec sit to stand:   17 GAIT: Distance walked: in clinic 60' Assistive device utilized: [employer] - 4 wheeled Level of assistance: SBA Comments: fast cadence                                                                                                                                 TREATMENT DATE: 07/06/24 Nustep L4x12min UE/LE HS curls 10lb 2x10 BLE; 15lb x 12 Knee extensions 5lb 2x10 BLE  Standing heel/toe raises B x 20 Standing hip abduction 2lb 2x10 B Standing hip extension 2lb 2x10 B Standing marching 2lb 2x10 B Sidestepping 2lb weights along counter top 3x Toe taps on 9' stool with 2lb weights x 10  [facility] PT Assessment - 07/06/24 0001       Functional Gait  Assessment   Gait assessed  Yes    Gait Level Surface Walks 20 ft in less than 7 sec but greater than 5.5 sec, uses assistive device, slower speed, mild gait deviations, or deviates 6-10 in outside of the 12 in walkway width.    Change in Gait Speed Able to change speed, demonstrates mild gait deviations, deviates 6-10 in outside of the 12 in walkway width, or no gait deviations, unable to achieve a major change in velocity, or uses a change in velocity, or uses an assistive device.    Gait with Horizontal Head Turns Performs head turns smoothly with slight change in gait velocity (eg, minor disruption to smooth gait path), deviates 6-10 in outside 12 in walkway width, or uses an assistive device.    Gait with Vertical Head Turns Performs task with slight change in gait velocity (eg, minor disruption to smooth gait path), deviates 6 - 10 in outside 12 in walkway width or uses assistive device    Gait and Pivot Turn Pivot turns safely within 3 sec and stops quickly with no loss of balance.    Step Over  Obstacle Is able to step over 2 stacked shoe boxes taped together (9 in total height) without changing gait speed. No evidence of imbalance.    Gait with Narrow Base of Support Ambulates less than 4 steps heel to toe or cannot perform without  assistance.    Gait with Eyes Closed Walks 20 ft, slow speed, abnormal gait pattern, evidence for imbalance, deviates 10-15 in outside 12 in walkway width. Requires more than 9 sec to ambulate 20 ft.    Ambulating Backwards Walks 20 ft, slow speed, abnormal gait pattern, evidence for imbalance, deviates 10-15 in outside 12 in walkway width.    Steps Alternating feet, must use rail.    Total Score 18          06/29/24 Nustep L4x65min UE/LE HS curls 10lb 2x10 BLE Knee extensions 5lb 2x10 BLE  Standing heel/toe rocks x 10 B Standing hip abduction x 10 B 1lb Standing hip extension x 10 B 1lb Standing marching x 10 B 1lb Sidestepping at counter 1lb  Standing rows and shoulder ext GTB  Semi tandem stance 2x15 sec  Semi tandem stance with head turns and head nods  06/26/24: Gait with rollator 5 laps around clinic, 450' Standing at sink on airex for heel/ toe rocks, light B UE support and CGA  Standing at sink single arm support semi tandem for head nods and vertical head movements 10 reps each, alternating lead foot. Standing at sink with one foot on airex, forward lunges, raising on forefeet B 15 x each Post lean against sink, B heel rocks, 15 x Seated rows with blue t band under feet 15 x \ Standing with 1# cuff weights around each ankle for side stepping along 10' counter 4 lengths Gait x 300' with rollator in long hallway Standing at sink for alt hip extension    06/21/24: Evaluation, education/ recommended to pt and son possibly adding bed rail to assist with further fall prevention when getting out of bed and add grab bars outside of shower.      PATIENT EDUCATION:  Education details: FGA- 18/30 Person educated: Patient Education method: Explanation, Demonstration, Tactile cues, and Verbal cues Education comprehension: verbalized understanding, returned demonstration, verbal cues required, tactile cues required, and needs further education  HOME EXERCISE  PROGRAM: TBD  ASSESSMENT:  CLINICAL IMPRESSION: Pt shows good response to the interventions today. Assessed FGA with patient showing fall risk according to the test. He does require a lot of direction and cuing throughout the session d/t some dementia. No adverse reactions today.  Patient is a 89 y.o. male participated today in physical therapy treatment for recent decline in mental acuity and recent falls.  Today we addressed a combination of righting reactions, particularly focusing on ankle stability and control and narrowed stance dynamic activities.  We did attempt the Nustep for cardiovascular training but it exacerbated his mid back pain.  He did demonstrate one episode today of balance loss in which he moved very quickly and leaned on his rollator which he had not locked , he did required hands on assist to maintain his balance.  He should benefit from supervised exercise with combined cardiovascular training and balance training with physical therapy to further assist him with fall prevention. T OBJECTIVE IMPAIRMENTS: decreased balance, decreased cognition, decreased knowledge of condition, decreased knowledge of use of DME, difficulty walking, decreased safety awareness, and pain.   ACTIVITY LIMITATIONS: carrying, transfers, and locomotion level  PARTICIPATION LIMITATIONS: community activity  PERSONAL FACTORS: Age, Time since onset  of injury/illness/exacerbation, and 1-2 comorbidities: alzheimers are also affecting patient's functional outcome.   REHAB POTENTIAL: Good  CLINICAL DECISION MAKING: Evolving/moderate complexity  EVALUATION COMPLEXITY: Moderate   GOALS: Goals reviewed with patient? Yes  SHORT TERM GOALS: Target date:  2 weeks, 07/05/24 I HEP Baseline: Goal status: 06/29/24; need to provide, would need help of son to complete most likely   LONG TERM GOALS: Target date: 09/13/24, 12 weeks  Klemz improve from 48/56 to 53/56 Baseline:  Goal status: INITIAL  2.  FGA  27/30 Baseline: FGA-- 18/30 Goal status: PROGRESSING- 07/06/24  3.  Improve righting reactions, able to heel rock without loss of balance Baseline:  Goal status: INITIAL   PLAN:  PT FREQUENCY: 2x/week  PT DURATION: 12 weeks  PLANNED INTERVENTIONS: 97110-Therapeutic exercises, 97530- Therapeutic activity, 97112- Neuromuscular re-education, 97535- Self Care, [id number]- Manual therapy, and Patient/Family education  PLAN FOR NEXT SESSION: address cardivascular conditioning, balance training   Sol LITTIE Gaskins, PTA 07/06/2024, 8:52 AM  "

## 2024-07-10 ENCOUNTER — Other Ambulatory Visit: Payer: Self-pay

## 2024-07-10 ENCOUNTER — Ambulatory Visit

## 2024-07-10 DIAGNOSIS — R296 Repeated falls: Secondary | ICD-10-CM

## 2024-07-10 DIAGNOSIS — R262 Difficulty in walking, not elsewhere classified: Secondary | ICD-10-CM

## 2024-07-10 NOTE — Therapy (Signed)
 " OUTPATIENT PHYSICAL THERAPY LOWER EXTREMITY TREATMENT   Patient Name: [name] MRN: [id number] DOB:Oct 12, 1931, 89 y.o., male Today's Date: 07/10/2024  END OF SESSION:  PT End of Session - 07/10/24 0809     Visit Number 5    Date for Recertification  09/13/24    Progress Note Due on Visit 10    PT Start Time 0800    PT Stop Time 0845    PT Time Calculation (min) 45 min    Activity Tolerance Patient tolerated treatment well    Behavior During Therapy [facility] for tasks assessed/performed              Past Medical History:  Diagnosis Date   Anemia, unspecified 10/27/2009   BPH with urinary obstruction 12/13/2012   CAD (coronary artery disease)    LHC 03/25/11 by Dr. Wonda:  pLAD 99%, oCFX 20-30%, pOM1 40%, dAVCFX 20-30%.  EF was normal on nuclear study.  He was treated with a Promus DES to his pLAD.    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/2023   Monoclonal gammopathy 04/06/2019   Multiple closed fractures of ribs of left side 03/21/2019   Multiple myeloma not having achieved remission 08/13/2022   Osteoporosis 03/21/2019   Peripheral vascular disorder due to diabetes mellitus 08/13/2022   Personal history of colonic polyps 1996   villous adenoma   Primary open angle glaucoma (POAG) of left eye, mild stage 02/04/2019   Primary open angle glaucoma (POAG) of right eye, moderate stage 02/04/2019   Prostate cancer 02/15/2017   Stage 3b chronic kidney disease 02/06/2020   Type II diabetes mellitus 07/04/2006   Vitamin D  deficiency 01/28/2021   Past Surgical History:  Procedure Laterality Date   CARDIAC CATHETERIZATION  03/25/2011   cataract  Bilateral 2010   corenea implants  june and sept 2018   dr donnice [facility]   CORONARY STENT PLACEMENT  03/25/2011   RADIOACTIVE SEED IMPLANT N/A 05/06/2017   Procedure: RADIOACTIVE SEED IMPLANT/BRACHYTHERAPY IMPLANT;  Surgeon: Ottelin, Mark, MD;  Location: [facility];  Service: Urology;  Laterality: N/A;   SPACE OAR INSTILLATION N/A 05/06/2017   Procedure: SPACE OAR INSTILLATION;  Surgeon: Ottelin, Mark, MD;  Location: [facility];  Service: Urology;  Laterality: N/A;   Patient Active Problem List   Diagnosis Date Noted   Skin tear of forearm without complication, left, initial encounter 06/25/2024   Hypotension due to hypovolemia 04/13/2024   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 02/22/2023   Macular degeneration    Multiple myeloma not having achieved remission 08/13/2022   Lambda light chain disease 08/13/2022   Peripheral vascular disorder due to diabetes mellitus 08/13/2022   Vitamin D  deficiency 01/28/2021   Stage 3b chronic kidney disease 02/06/2020   Diabetic polyneuropathy 02/06/2020   Hyperlipidemia associated with type 2 diabetes mellitus 04/06/2019   Monoclonal gammopathy 04/06/2019   Osteoporosis 03/21/2019   Primary open angle glaucoma (POAG) of left eye, mild stage 02/04/2019   Primary open angle glaucoma (POAG) of right eye, moderate stage 02/04/2019   Prostate cancer 02/15/2017   Fuchs' corneal dystrophy 08/18/2016   BPH with urinary obstruction 12/13/2012   CAD (coronary artery disease) 04/09/2011  Gastritis and gastroduodenitis 05/25/2010   Anemia, unspecified 10/27/2009   Type II diabetes mellitus with stage 3 chronic kidney disease (HCC) 07/04/2006   Essential hypertension 07/04/2006   History of colonic polyps 1996    PCP: Donald Cyndee Rockers DO  REFERRING PROVIDER: same  REFERRING DIAG: unsteady gait, balance disorder  THERAPY DIAG:  Repeated falls  Difficulty in walking, not elsewhere classified  Rationale for  Evaluation and Treatment: Rehabilitation  ONSET DATE: several months  SUBJECTIVE:   SUBJECTIVE STATEMENT: Pt denies any complaints today, denies any pain, my back doesn't hurt at all now  PERTINENT HISTORY: Son describes recent decline in last few weeks, months and several falls, had T spine compression fracture that was present for several months,  this weekend he fell again and fractured several ribs . Recent incidences of hallucinations and confusion.  Pt lives with one son, has 3 son and one daughter in law within the area who are rotating supervision for pt due to his recent decline in mental acuity and his falls. They have obtained a bed alarm, his bedroom and bathroom are upstairs.  Also he is recently using a rollator in and out of the house.  The pt has been very physically active, participated in the senior games in April and won the 100 and 200 meter dash.   His decline in physical stability and mental acuity have occurred rather recently and suddenly.  PAIN:  Are you having pain? Frequently c/o today of lower back pain across T 9 region  PRECAUTIONS: None  RED FLAGS: None   WEIGHT BEARING RESTRICTIONS: No  FALLS:  Has patient fallen in last 6 months? No  LIVING ENVIRONMENT: Lives with: lives with their family Lives in: House/apartment Stairs: Yes: Internal: flight steps; on right going up Has following equipment at home: Vannie - 4 wheeled  OCCUPATION: retired  PLOF: Independent  PATIENT GOALS: son's goal is to ensure pts safety, patient is concerned about his back pain  NEXT MD VISIT: 1 month  OBJECTIVE:  Note: Objective measures were completed at Evaluation unless otherwise noted.  DIAGNOSTIC FINDINGS: MRI brain negative T5 compression fx unknown origin and age 13 CT head C-spine thoracic spine showed no acute traumatic findings.  Elbow looks okay.  Multiple rib fractures on the left and right seen on CT chest.    PATIENT SURVEYS:   TBD  COGNITION: Overall cognitive status: History of cognitive impairments - at baseline     SENSATION: WFL  EDEMA:  None noted  MUSCLE LENGTH:wfl POSTURE: lean healthy male, mild increased mid thoracic flexion, lumbar flexion in sitting and standing  PALPATION: C/o pain around T 8 level, across lower back  LOWER EXTREMITY ROM: wfl   LOWER EXTREMITY MMT: all grossly wnl, not assessed in side lying and supine   FUNCTIONAL TESTS:  Kauth Balance Scale:  Item Test date: 06/21/24 Date:  Date:   Sitting to standing 4. able to stand without using hands and stabilize independently Insert SmartPhrase OPRCBERGREEVAL Insert SmartPhrase OPRCBERGREEVAL  2. Standing unsupported 4. able to stand safely for 2 minutes    3. Sitting with back unsupported, feet supported 4. able to sit safely and securely for 2 minutes    4. Standing to sitting 4. sits safely with minimal use of hands    5. Pivot transfer  4. able to transfer safely with minor use of hands    6. Standing unsupported with eyes closed 4. able to stand 10 seconds safely    7. Standing  unsupported with feet together 4. able to place feet together independently and stand 1 minute safely    8. Reaching forward with outstretched arms while standing 3. can reach forward 12 cm (5 inches)    9. Pick up object from the floor from standing 3. able to pick up slipper but needs supervision    10. Turning to look behind over left and right shoulders while standing 4. looks behind from both sides and weight shifts well    11. Turn 360 degrees 4. able to turn 360 degrees safely in 4 seconds or less    12. Place alternate foot on step or stool while standing unsupported 1. able to complete > 2 steps needs minimal assist    13. Standing unsupported one foot in front 0. loses balance while stepping or standing    14. Standing on one leg 1. tries to lift leg unable to hold 3 seconds but remains standing independently.      Total Score 48/56 Total  Score:    Total Score:     30 sec sit to stand:  17 GAIT: Distance walked: in clinic 70' Assistive device utilized: [employer] - 4 wheeled Level of assistance: SBA Comments: fast cadence                                                                                                                                 TREATMENT DATE: 07/10/24:  Nustep L 4 x 6 min Ue's and LE's Neuromuscular re education:  Standing by wall ladder for high marches x 2 sets 10  Standing at wall ladder semi tandem for arm swings with 4# alt  Wall ladder with light touch hands for semi tandem standing with head turns Wall ladder with light touch hands for heel/ toe rocks Gait in hallway, x 900' for 6 min walk test, able to walk 6 min . 45 sec with rollator for safety .  Some cues to avoid turning too quickly Knee ext B  5# 3 x 10 Knee flexion B 10# 3 x 10 Walking along counter, 10' with horizontal head turns Walking along counter 10' with head nods Walking along counter with eyes closes Standing on airex in corner, for horizontal head turns and head nods, reaching across midline( short reaches due to his h/o thoracic compression fx) 10 x each side in corner Gait along length of counter holding ball on airex, goal of preventing ball from rolling off airex.  07/06/24 Nustep L4x45min UE/LE HS curls 10lb 2x10 BLE; 15lb x 12 Knee extensions 5lb 2x10 BLE  Standing heel/toe raises B x 20 Standing hip abduction 2lb 2x10 B Standing hip extension 2lb 2x10 B Standing marching 2lb 2x10 B Sidestepping 2lb weights along counter top 3x Toe taps on 9' stool with 2lb weights x 10  [facility] PT Assessment - 07/06/24 0001       Functional Gait  Assessment   Gait assessed  Yes  Gait Level Surface Walks 20 ft in less than 7 sec but greater than 5.5 sec, uses assistive device, slower speed, mild gait deviations, or deviates 6-10 in outside of the 12 in walkway width.    Change in Gait Speed Able to change speed, demonstrates mild  gait deviations, deviates 6-10 in outside of the 12 in walkway width, or no gait deviations, unable to achieve a major change in velocity, or uses a change in velocity, or uses an assistive device.    Gait with Horizontal Head Turns Performs head turns smoothly with slight change in gait velocity (eg, minor disruption to smooth gait path), deviates 6-10 in outside 12 in walkway width, or uses an assistive device.    Gait with Vertical Head Turns Performs task with slight change in gait velocity (eg, minor disruption to smooth gait path), deviates 6 - 10 in outside 12 in walkway width or uses assistive device    Gait and Pivot Turn Pivot turns safely within 3 sec and stops quickly with no loss of balance.    Step Over Obstacle Is able to step over 2 stacked shoe boxes taped together (9 in total height) without changing gait speed. No evidence of imbalance.    Gait with Narrow Base of Support Ambulates less than 4 steps heel to toe or cannot perform without assistance.    Gait with Eyes Closed Walks 20 ft, slow speed, abnormal gait pattern, evidence for imbalance, deviates 10-15 in outside 12 in walkway width. Requires more than 9 sec to ambulate 20 ft.    Ambulating Backwards Walks 20 ft, slow speed, abnormal gait pattern, evidence for imbalance, deviates 10-15 in outside 12 in walkway width.    Steps Alternating feet, must use rail.    Total Score 18          06/29/24 Nustep L4x69min UE/LE HS curls 10lb 2x10 BLE Knee extensions 5lb 2x10 BLE  Standing heel/toe rocks x 10 B Standing hip abduction x 10 B 1lb Standing hip extension x 10 B 1lb Standing marching x 10 B 1lb Sidestepping at counter 1lb  Standing rows and shoulder ext GTB  Semi tandem stance 2x15 sec  Semi tandem stance with head turns and head nods  06/26/24: Gait with rollator 5 laps around clinic, 450' Standing at sink on airex for heel/ toe rocks, light B UE support and CGA  Standing at sink single arm support semi tandem for  head nods and vertical head movements 10 reps each, alternating lead foot. Standing at sink with one foot on airex, forward lunges, raising on forefeet B 15 x each Post lean against sink, B heel rocks, 15 x Seated rows with blue t band under feet 15 x \ Standing with 1# cuff weights around each ankle for side stepping along 10' counter 4 lengths Gait x 300' with rollator in long hallway Standing at sink for alt hip extension    06/21/24: Evaluation, education/ recommended to pt and son possibly adding bed rail to assist with further fall prevention when getting out of bed and add grab bars outside of shower.      PATIENT EDUCATION:  Education details: FGA- 18/30 Person educated: Patient Education method: Explanation, Demonstration, Tactile cues, and Verbal cues Education comprehension: verbalized understanding, returned demonstration, verbal cues required, tactile cues required, and needs further education  HOME EXERCISE PROGRAM: TBD  ASSESSMENT:  CLINICAL IMPRESSION: Pt shows good response to the interventions today. His righting reactions and overall dynamic balance has improved.  He  has no c/o back pain today.  He was able to complete 6 min walk test without difficulty.   He should benefit from ongoing supervised exercise with combined cardiovascular training and balance training with physical therapy to further assist him with fall prevention. Discussed further treatment plan with son and pt is scheduled for 3 more visits currently and we will discuss whether further PT indicated at end of next week.   OBJECTIVE IMPAIRMENTS: decreased balance, decreased cognition, decreased knowledge of condition, decreased knowledge of use of DME, difficulty walking, decreased safety awareness, and pain.   ACTIVITY LIMITATIONS: carrying, transfers, and locomotion level  PARTICIPATION LIMITATIONS: community activity  PERSONAL FACTORS: Age, Time since onset of injury/illness/exacerbation, and 1-2  comorbidities: alzheimers are also affecting patient's functional outcome.   REHAB POTENTIAL: Good  CLINICAL DECISION MAKING: Evolving/moderate complexity  EVALUATION COMPLEXITY: Moderate   GOALS: Goals reviewed with patient? Yes  SHORT TERM GOALS: Target date:  2 weeks, 07/05/24 I HEP Baseline: Goal status: 06/29/24; need to provide, would need help of son to complete most likely   LONG TERM GOALS: Target date: 09/13/24, 12 weeks  Pete improve from 48/56 to 53/56 Baseline:  Goal status: INITIAL  2.  FGA 27/30 Baseline: FGA-- 18/30 Goal status: PROGRESSING- 07/06/24  3.  Improve righting reactions, able to heel rock without loss of balance Baseline:  Goal status: INITIAL   PLAN:  PT FREQUENCY: 2x/week  PT DURATION: 12 weeks  PLANNED INTERVENTIONS: 97110-Therapeutic exercises, 97530- Therapeutic activity, [id number]- Neuromuscular re-education, 97535- Self Care, [id number]- Manual therapy, and Patient/Family education  PLAN FOR NEXT SESSION: address cardivascular conditioning, balance training   Laurene Melendrez L Jaiden Dinkins, PT, DPT, OCS 07/10/2024, 9:12 AM  "

## 2024-07-11 ENCOUNTER — Telehealth: Payer: Self-pay

## 2024-07-11 NOTE — Telephone Encounter (Signed)
 Returned call and left vm advising I didn't a referral placed for pt from our office.

## 2024-07-11 NOTE — Telephone Encounter (Signed)
 Copied from CRM [id number]. Topic: Referral - Question >> Jul 11, 2024 10:30 AM Ahlexyia S wrote: Reason for CRM: Jayla with [facility] in [place] called in stating that a CMA at their offices were asking about the diagnosis and if there was a GI issue that needed to be taken care of at their clinic. Jayla is requesting a callback regarding this.  Referral # [id number]

## 2024-07-13 ENCOUNTER — Ambulatory Visit

## 2024-07-13 DIAGNOSIS — R296 Repeated falls: Secondary | ICD-10-CM

## 2024-07-13 DIAGNOSIS — R262 Difficulty in walking, not elsewhere classified: Secondary | ICD-10-CM

## 2024-07-13 NOTE — Therapy (Addendum)
 " OUTPATIENT PHYSICAL THERAPY LOWER EXTREMITY TREATMENT   Patient Name: [name] MRN: [id number] DOB:[birth year], 89 y.o., male Today's Date: 07/13/2024  END OF SESSION:  PT End of Session - 07/13/24 0001     Visit Number 6    Date for Recertification  09/13/24    Progress Note Due on Visit 10    PT Start Time 0840    PT Stop Time 0920    PT Time Calculation (min) 40 min    Equipment Utilized During Treatment Gait belt    Activity Tolerance Patient tolerated treatment well    Behavior During Therapy [facility] for tasks assessed/performed               Past Medical History:  Diagnosis Date   Anemia, unspecified 10/27/2009   BPH with urinary obstruction 12/13/2012   CAD (coronary artery disease)    LHC 03/25/11 by Dr. Wonda:  pLAD 99%, oCFX 20-30%, pOM1 40%, dAVCFX 20-30%.  EF was normal on nuclear study.  He was treated with a Promus DES to his pLAD.    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/2023   Monoclonal gammopathy 04/06/2019   Multiple closed fractures of ribs of left side 03/21/2019   Multiple myeloma not having achieved remission 08/13/2022   Osteoporosis 03/21/2019   Peripheral vascular disorder due to diabetes mellitus 08/13/2022   Personal history of colonic polyps 1996   villous adenoma   Primary open angle glaucoma (POAG) of left eye, mild stage 02/04/2019   Primary open angle glaucoma (POAG) of right eye, moderate stage 02/04/2019   Prostate cancer 02/15/2017   Stage 3b chronic kidney disease 02/06/2020   Type II diabetes mellitus 07/04/2006   Vitamin D  deficiency 01/28/2021   Past Surgical History:  Procedure Laterality Date    CARDIAC CATHETERIZATION  03/25/2011   cataract Bilateral 2010   corenea implants  june and sept 2018   dr donnice [facility]   CORONARY STENT PLACEMENT  03/25/2011   RADIOACTIVE SEED IMPLANT N/A 05/06/2017   Procedure: RADIOACTIVE SEED IMPLANT/BRACHYTHERAPY IMPLANT;  Surgeon: Ottelin, Mark, MD;  Location: [facility] [place];  Service: Urology;  Laterality: N/A;   SPACE OAR INSTILLATION N/A 05/06/2017   Procedure: SPACE OAR INSTILLATION;  Surgeon: Ottelin, Mark, MD;  Location: [facility];  Service: Urology;  Laterality: N/A;   Patient Active Problem List   Diagnosis Date Noted   Skin tear of forearm without complication, left, initial encounter 06/25/2024   Hypotension due to hypovolemia 04/13/2024   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 02/22/2023   Macular degeneration    Multiple myeloma not having achieved remission 08/13/2022   Lambda light chain disease 08/13/2022   Peripheral vascular disorder due to diabetes mellitus 08/13/2022   Vitamin D  deficiency 01/28/2021   Stage 3b chronic kidney disease 02/06/2020   Diabetic polyneuropathy 02/06/2020   Hyperlipidemia associated with type 2 diabetes mellitus 04/06/2019   Monoclonal gammopathy 04/06/2019   Osteoporosis 03/21/2019   Primary open angle glaucoma (POAG) of left eye, mild stage 02/04/2019   Primary open angle glaucoma (POAG) of right eye, moderate stage 02/04/2019   Prostate cancer 02/15/2017   Fuchs' corneal dystrophy 08/18/2016   BPH with urinary obstruction  12/13/2012   CAD (coronary artery disease) 04/09/2011   Gastritis and gastroduodenitis 05/25/2010   Anemia, unspecified 10/27/2009   Type II diabetes mellitus with stage 3 chronic kidney disease (HCC) 07/04/2006   Essential hypertension 07/04/2006   History of colonic polyps 1996    PCP: Antonio Cyndee Rockers DO  REFERRING PROVIDER: same  REFERRING DIAG: unsteady gait, balance disorder  THERAPY DIAG:  Repeated falls  Difficulty in  walking, not elsewhere classified  Rationale for Evaluation and Treatment: Rehabilitation  ONSET DATE: several months  SUBJECTIVE:   SUBJECTIVE STATEMENT: Pt reports no complaints today, denies any pain.  PERTINENT HISTORY: Son describes recent decline in last few weeks, months and several falls, had T spine compression fracture that was present for several months,  this weekend he fell again and fractured several ribs . Recent incidences of hallucinations and confusion.  Pt lives with one son, has 3 son and one daughter in law within the area who are rotating supervision for pt due to his recent decline in mental acuity and his falls. They have obtained a bed alarm, his bedroom and bathroom are upstairs.  Also he is recently using a rollator in and out of the house.  The pt has been very physically active, participated in the senior games in April and won the 100 and 200 meter dash.   His decline in physical stability and mental acuity have occurred rather recently and suddenly.  PAIN:  Are you having pain? Frequently c/o today of lower back pain across T 9 region  PRECAUTIONS: None  RED FLAGS: None   WEIGHT BEARING RESTRICTIONS: No  FALLS:  Has patient fallen in last 6 months? No  LIVING ENVIRONMENT: Lives with: lives with their family Lives in: House/apartment Stairs: Yes: Internal: flight steps; on right going up Has following equipment at home: Vannie - 4 wheeled  OCCUPATION: retired  PLOF: Independent  PATIENT GOALS: son's goal is to ensure pts safety, patient is concerned about his back pain  NEXT MD VISIT: 1 month  OBJECTIVE:  Note: Objective measures were completed at Evaluation unless otherwise noted.  DIAGNOSTIC FINDINGS: MRI brain negative T5 compression fx unknown origin and age 6 CT head C-spine thoracic spine showed no acute traumatic findings.  Elbow looks okay.  Multiple rib fractures on the left and right seen on CT chest.    PATIENT SURVEYS:   TBD  COGNITION: Overall cognitive status: History of cognitive impairments - at baseline     SENSATION: WFL  EDEMA:  None noted  MUSCLE LENGTH:wfl POSTURE: lean healthy male, mild increased mid thoracic flexion, lumbar flexion in sitting and standing  PALPATION: C/o pain around T 8 level, across lower back  LOWER EXTREMITY ROM: wfl   LOWER EXTREMITY MMT: all grossly wnl, not assessed in side lying and supine   FUNCTIONAL TESTS:  Maclachlan Balance Scale:  Item Test date: 06/21/24 Date:  Date:   Sitting to standing 4. able to stand without using hands and stabilize independently Insert SmartPhrase OPRCBERGREEVAL Insert SmartPhrase OPRCBERGREEVAL  2. Standing unsupported 4. able to stand safely for 2 minutes    3. Sitting with back unsupported, feet supported 4. able to sit safely and securely for 2 minutes    4. Standing to sitting 4. sits safely with minimal use of hands    5. Pivot transfer  4. able to transfer safely with minor use of hands    6. Standing unsupported with eyes closed 4. able to stand 10 seconds safely  7. Standing unsupported with feet together 4. able to place feet together independently and stand 1 minute safely    8. Reaching forward with outstretched arms while standing 3. can reach forward 12 cm (5 inches)    9. Pick up object from the floor from standing 3. able to pick up slipper but needs supervision    10. Turning to look behind over left and right shoulders while standing 4. looks behind from both sides and weight shifts well    11. Turn 360 degrees 4. able to turn 360 degrees safely in 4 seconds or less    12. Place alternate foot on step or stool while standing unsupported 1. able to complete > 2 steps needs minimal assist    13. Standing unsupported one foot in front 0. loses balance while stepping or standing    14. Standing on one leg 1. tries to lift leg unable to hold 3 seconds but remains standing independently.      Total Score 48/56 Total  Score:    Total Score:     30 sec sit to stand:  17 GAIT: Distance walked: in clinic 70' Assistive device utilized: [employer] - 4 wheeled Level of assistance: SBA Comments: fast cadence                                                                                                                                 TREATMENT DATE: 07/13/24 NuStep L4 x 6 min UE/LE Following pt walked on airex with PTA assist with gait belt and chair in reachable distance:  Side stepping x 3 Tandem stance walking x 2 Tandem stance walking holding Gball x 2 Forward reaching alt B UE 2x8 each side Forward reaching with cup stacking alt B UE 2x8 each side Seated HS 2x10 B LE Seated knee ext 2x10 B LE Seated hip add Gball 2x10 B LE Seated hip abd RTB 2x10 B LE Standing at counter guarding with gait belt: Tandem stance alt arm swings #3lb wt B UE Side step over sea urchins 3x10 B LE Tandem walking with horizontal head turns x 2 UE   07/10/24:  Nustep L 4 x 6 min Ue's and LE's Neuromuscular re education:  Standing by wall ladder for high marches x 2 sets 10  Standing at wall ladder semi tandem for arm swings with 4# alt  Wall ladder with light touch hands for semi tandem standing with head turns Wall ladder with light touch hands for heel/ toe rocks Gait in hallway, x 900' for 6 min walk test, able to walk 6 min . 45 sec with rollator for safety .  Some cues to avoid turning too quickly Knee ext B  5# 3 x 10 Knee flexion B 10# 3 x 10 Walking along counter, 10' with horizontal head turns Walking along counter 10' with head nods Walking along counter with eyes closes Standing on airex in corner, for horizontal head turns  and head nods, reaching across midline( short reaches due to his h/o thoracic compression fx) 10 x each side in corner Gait along length of counter holding ball on airex, goal of preventing ball from rolling off airex.  07/06/24 Nustep L4x27min UE/LE HS curls 10lb 2x10 BLE; 15lb x 12 Knee  extensions 5lb 2x10 BLE  Standing heel/toe raises B x 20 Standing hip abduction 2lb 2x10 B Standing hip extension 2lb 2x10 B Standing marching 2lb 2x10 B Sidestepping 2lb weights along counter top 3x Toe taps on 9' stool with 2lb weights x 10  [facility] PT Assessment - 07/06/24 0001       Functional Gait  Assessment   Gait assessed  Yes    Gait Level Surface Walks 20 ft in less than 7 sec but greater than 5.5 sec, uses assistive device, slower speed, mild gait deviations, or deviates 6-10 in outside of the 12 in walkway width.    Change in Gait Speed Able to change speed, demonstrates mild gait deviations, deviates 6-10 in outside of the 12 in walkway width, or no gait deviations, unable to achieve a major change in velocity, or uses a change in velocity, or uses an assistive device.    Gait with Horizontal Head Turns Performs head turns smoothly with slight change in gait velocity (eg, minor disruption to smooth gait path), deviates 6-10 in outside 12 in walkway width, or uses an assistive device.    Gait with Vertical Head Turns Performs task with slight change in gait velocity (eg, minor disruption to smooth gait path), deviates 6 - 10 in outside 12 in walkway width or uses assistive device    Gait and Pivot Turn Pivot turns safely within 3 sec and stops quickly with no loss of balance.    Step Over Obstacle Is able to step over 2 stacked shoe boxes taped together (9 in total height) without changing gait speed. No evidence of imbalance.    Gait with Narrow Base of Support Ambulates less than 4 steps heel to toe or cannot perform without assistance.    Gait with Eyes Closed Walks 20 ft, slow speed, abnormal gait pattern, evidence for imbalance, deviates 10-15 in outside 12 in walkway width. Requires more than 9 sec to ambulate 20 ft.    Ambulating Backwards Walks 20 ft, slow speed, abnormal gait pattern, evidence for imbalance, deviates 10-15 in outside 12 in walkway width.    Steps Alternating  feet, must use rail.    Total Score 18          06/29/24 Nustep L4x45min UE/LE HS curls 10lb 2x10 BLE Knee extensions 5lb 2x10 BLE  Standing heel/toe rocks x 10 B Standing hip abduction x 10 B 1lb Standing hip extension x 10 B 1lb Standing marching x 10 B 1lb Sidestepping at counter 1lb  Standing rows and shoulder ext GTB  Semi tandem stance 2x15 sec  Semi tandem stance with head turns and head nods  06/26/24: Gait with rollator 5 laps around clinic, 450' Standing at sink on airex for heel/ toe rocks, light B UE support and CGA  Standing at sink single arm support semi tandem for head nods and vertical head movements 10 reps each, alternating lead foot. Standing at sink with one foot on airex, forward lunges, raising on forefeet B 15 x each Post lean against sink, B heel rocks, 15 x Seated rows with blue t band under feet 15 x \ Standing with 1# cuff weights around each ankle for side  stepping along 10' counter 4 lengths Gait x 300' with rollator in long hallway Standing at sink for alt hip extension    06/21/24: Evaluation, education/ recommended to pt and son possibly adding bed rail to assist with further fall prevention when getting out of bed and add grab bars outside of shower.      PATIENT EDUCATION:  Education details: FGA- 18/30 Person educated: Patient Education method: Explanation, Demonstration, Tactile cues, and Verbal cues Education comprehension: verbalized understanding, returned demonstration, verbal cues required, tactile cues required, and needs further education  HOME EXERCISE PROGRAM: TBD  ASSESSMENT:  CLINICAL IMPRESSION: Pt showed good response to the interventions today. He has improved reactions and overall dynamic balance. He has no c/o back pain today. He was able to complete balance activities with mod assist guarding. Pt did have trouble with coordination with standing thera ex. Pt could benefit from ongoing supervised exercise with combined  cardiovascular training, balance, and strength training  OBJECTIVE IMPAIRMENTS: decreased balance, decreased cognition, decreased knowledge of condition, decreased knowledge of use of DME, difficulty walking, decreased safety awareness, and pain.   ACTIVITY LIMITATIONS: carrying, transfers, and locomotion level  PARTICIPATION LIMITATIONS: community activity  PERSONAL FACTORS: Age, Time since onset of injury/illness/exacerbation, and 1-2 comorbidities: alzheimers are also affecting patient's functional outcome.   REHAB POTENTIAL: Good  CLINICAL DECISION MAKING: Evolving/moderate complexity  EVALUATION COMPLEXITY: Moderate   GOALS: Goals reviewed with patient? Yes  SHORT TERM GOALS: Target date:  2 weeks, 07/05/24 I HEP Baseline: Goal status: 06/29/24; need to provide, would need help of son to complete most likely   LONG TERM GOALS: Target date: 09/13/24, 12 weeks  Reznick improve from 48/56 to 53/56 Baseline:  Goal status: INITIAL  2.  FGA 27/30 Baseline: FGA-- 18/30 Goal status: PROGRESSING- 07/06/24  3.  Improve righting reactions, able to heel rock without loss of balance Baseline:  Goal status: INITIAL   PLAN:  PT FREQUENCY: 2x/week  PT DURATION: 12 weeks  PLANNED INTERVENTIONS: 97110-Therapeutic exercises, 97530- Therapeutic activity, 97112- Neuromuscular re-education, 97535- Self Care, [id number]- Manual therapy, and Patient/Family education  PLAN FOR NEXT SESSION: address cardivascular conditioning, balance training   Rockney Alas, Student-PT 07/13/2024, 10:33 AM  "

## 2024-07-17 ENCOUNTER — Ambulatory Visit

## 2024-07-18 ENCOUNTER — Ambulatory Visit: Admitting: Internal Medicine

## 2024-07-20 ENCOUNTER — Ambulatory Visit

## 2024-08-06 ENCOUNTER — Ambulatory Visit: Admitting: Surgery

## 2024-08-13 ENCOUNTER — Ambulatory Visit: Payer: Self-pay | Admitting: Physician Assistant

## 2024-08-16 ENCOUNTER — Ambulatory Visit: Admitting: Family Medicine

## 2024-08-17 ENCOUNTER — Ambulatory Visit: Admitting: Internal Medicine

## 2024-11-07 ENCOUNTER — Inpatient Hospital Stay: Admitting: Hematology and Oncology

## 2024-11-07 ENCOUNTER — Inpatient Hospital Stay

## 2024-11-15 ENCOUNTER — Ambulatory Visit
